# Patient Record
Sex: Male | Born: 1935 | Race: White | Hispanic: No | Smoking: Former smoker
Health system: Southern US, Community
[De-identification: ages and names within clinical notes are randomized; demographics above are authoritative.]

## PROBLEM LIST (undated history)

## (undated) DIAGNOSIS — R7881 Bacteremia: Secondary | ICD-10-CM

## (undated) DIAGNOSIS — I63412 Cerebral infarction due to embolism of left middle cerebral artery: Secondary | ICD-10-CM

## (undated) DIAGNOSIS — F1020 Alcohol dependence, uncomplicated: Secondary | ICD-10-CM

## (undated) DIAGNOSIS — M009 Pyogenic arthritis, unspecified: Secondary | ICD-10-CM

## (undated) DIAGNOSIS — I6521 Occlusion and stenosis of right carotid artery: Secondary | ICD-10-CM

## (undated) DIAGNOSIS — M1990 Unspecified osteoarthritis, unspecified site: Secondary | ICD-10-CM

## (undated) DIAGNOSIS — B955 Unspecified streptococcus as the cause of diseases classified elsewhere: Secondary | ICD-10-CM

## (undated) DIAGNOSIS — A419 Sepsis, unspecified organism: Secondary | ICD-10-CM

## (undated) DIAGNOSIS — S065X9A Traumatic subdural hemorrhage with loss of consciousness of unspecified duration, initial encounter: Secondary | ICD-10-CM

## (undated) DIAGNOSIS — I4891 Unspecified atrial fibrillation: Secondary | ICD-10-CM

## (undated) DIAGNOSIS — K6812 Psoas muscle abscess: Secondary | ICD-10-CM

## (undated) DIAGNOSIS — E119 Type 2 diabetes mellitus without complications: Secondary | ICD-10-CM

## (undated) DIAGNOSIS — I10 Essential (primary) hypertension: Secondary | ICD-10-CM

## (undated) DIAGNOSIS — M109 Gout, unspecified: Secondary | ICD-10-CM

## (undated) DIAGNOSIS — G9340 Encephalopathy, unspecified: Secondary | ICD-10-CM

## (undated) HISTORY — PX: JOINT REPLACEMENT: SHX530

## (undated) HISTORY — PX: HIP ARTHROPLASTY: SHX981

## (undated) HISTORY — PX: OTHER SURGICAL HISTORY: SHX169

## (undated) HISTORY — PX: TONSILLECTOMY: SUR1361

## (undated) HISTORY — PX: TOTAL SHOULDER ARTHROPLASTY: SHX126

---

## 2000-02-02 ENCOUNTER — Encounter: Payer: Self-pay | Admitting: Orthopedic Surgery

## 2000-02-04 ENCOUNTER — Inpatient Hospital Stay (HOSPITAL_COMMUNITY): Admission: RE | Admit: 2000-02-04 | Discharge: 2000-02-09 | Payer: Self-pay | Admitting: Orthopedic Surgery

## 2000-02-04 ENCOUNTER — Encounter: Payer: Self-pay | Admitting: Orthopedic Surgery

## 2000-03-17 ENCOUNTER — Emergency Department (HOSPITAL_COMMUNITY): Admission: EM | Admit: 2000-03-17 | Discharge: 2000-03-17 | Payer: Self-pay | Admitting: Emergency Medicine

## 2002-09-26 ENCOUNTER — Encounter: Payer: Self-pay | Admitting: Orthopedic Surgery

## 2002-09-27 ENCOUNTER — Encounter: Payer: Self-pay | Admitting: Orthopedic Surgery

## 2002-09-27 ENCOUNTER — Inpatient Hospital Stay (HOSPITAL_COMMUNITY): Admission: RE | Admit: 2002-09-27 | Discharge: 2002-10-02 | Payer: Self-pay | Admitting: Orthopedic Surgery

## 2002-09-28 ENCOUNTER — Encounter: Payer: Self-pay | Admitting: Orthopedic Surgery

## 2002-12-18 ENCOUNTER — Encounter (INDEPENDENT_AMBULATORY_CARE_PROVIDER_SITE_OTHER): Payer: Self-pay | Admitting: Specialist

## 2002-12-18 ENCOUNTER — Inpatient Hospital Stay (HOSPITAL_COMMUNITY): Admission: RE | Admit: 2002-12-18 | Discharge: 2002-12-21 | Payer: Self-pay | Admitting: Orthopedic Surgery

## 2003-04-20 ENCOUNTER — Inpatient Hospital Stay (HOSPITAL_COMMUNITY): Admission: RE | Admit: 2003-04-20 | Discharge: 2003-04-22 | Payer: Self-pay | Admitting: Orthopedic Surgery

## 2003-09-03 ENCOUNTER — Inpatient Hospital Stay (HOSPITAL_COMMUNITY): Admission: RE | Admit: 2003-09-03 | Discharge: 2003-09-04 | Payer: Self-pay | Admitting: Orthopedic Surgery

## 2003-10-24 ENCOUNTER — Encounter: Admission: RE | Admit: 2003-10-24 | Discharge: 2003-10-24 | Payer: Self-pay | Admitting: Family Medicine

## 2005-08-10 ENCOUNTER — Ambulatory Visit (HOSPITAL_BASED_OUTPATIENT_CLINIC_OR_DEPARTMENT_OTHER): Admission: RE | Admit: 2005-08-10 | Discharge: 2005-08-10 | Payer: Self-pay | Admitting: Orthopedic Surgery

## 2006-03-18 ENCOUNTER — Encounter: Admission: RE | Admit: 2006-03-18 | Discharge: 2006-03-18 | Payer: Self-pay | Admitting: Family Medicine

## 2007-08-29 ENCOUNTER — Inpatient Hospital Stay (HOSPITAL_COMMUNITY): Admission: RE | Admit: 2007-08-29 | Discharge: 2007-08-31 | Payer: Self-pay | Admitting: Orthopedic Surgery

## 2008-04-18 ENCOUNTER — Inpatient Hospital Stay (HOSPITAL_COMMUNITY): Admission: RE | Admit: 2008-04-18 | Discharge: 2008-04-21 | Payer: Self-pay | Admitting: Orthopedic Surgery

## 2010-02-14 ENCOUNTER — Other Ambulatory Visit (HOSPITAL_COMMUNITY): Payer: Self-pay | Admitting: Orthopedic Surgery

## 2010-02-14 DIAGNOSIS — R52 Pain, unspecified: Secondary | ICD-10-CM

## 2010-02-19 ENCOUNTER — Other Ambulatory Visit (HOSPITAL_COMMUNITY): Payer: Self-pay

## 2010-02-26 ENCOUNTER — Ambulatory Visit (HOSPITAL_COMMUNITY)
Admission: RE | Admit: 2010-02-26 | Discharge: 2010-02-26 | Disposition: A | Discharge status: Home / Self Care | Payer: Medicare Other | Source: Ambulatory Visit | Attending: Orthopedic Surgery | Admitting: Orthopedic Surgery

## 2010-02-26 ENCOUNTER — Other Ambulatory Visit (HOSPITAL_COMMUNITY): Payer: Self-pay | Admitting: Orthopedic Surgery

## 2010-02-26 DIAGNOSIS — Z9889 Other specified postprocedural states: Secondary | ICD-10-CM

## 2010-02-26 DIAGNOSIS — N433 Hydrocele, unspecified: Secondary | ICD-10-CM | POA: Insufficient documentation

## 2010-02-26 DIAGNOSIS — M25559 Pain in unspecified hip: Secondary | ICD-10-CM | POA: Insufficient documentation

## 2010-02-26 DIAGNOSIS — Z96649 Presence of unspecified artificial hip joint: Secondary | ICD-10-CM | POA: Insufficient documentation

## 2010-02-26 DIAGNOSIS — D1779 Benign lipomatous neoplasm of other sites: Secondary | ICD-10-CM | POA: Insufficient documentation

## 2010-02-26 DIAGNOSIS — R52 Pain, unspecified: Secondary | ICD-10-CM

## 2010-02-26 DIAGNOSIS — Z1389 Encounter for screening for other disorder: Secondary | ICD-10-CM | POA: Insufficient documentation

## 2010-02-26 DIAGNOSIS — K5730 Diverticulosis of large intestine without perforation or abscess without bleeding: Secondary | ICD-10-CM | POA: Insufficient documentation

## 2010-04-16 LAB — CBC
HCT: 29.6 % — ABNORMAL LOW (ref 39.0–52.0)
Hemoglobin: 9.8 g/dL — ABNORMAL LOW (ref 13.0–17.0)
Hemoglobin: 10.4 g/dL — ABNORMAL LOW (ref 13.0–17.0)
MCHC: 35.2 g/dL (ref 30.0–36.0)
MCV: 97.4 fL (ref 78.0–100.0)
MCV: 98.2 fL (ref 78.0–100.0)
Platelets: 169 10*3/uL (ref 150–400)
Platelets: 161 10*3/uL (ref 150–400)
RBC: 2.83 MIL/uL — ABNORMAL LOW (ref 4.22–5.81)
RBC: 3.84 MIL/uL — ABNORMAL LOW (ref 4.22–5.81)
RDW: 14.1 % (ref 11.5–15.5)
RDW: 13.7 % (ref 11.5–15.5)
WBC: 8.7 10*3/uL (ref 4.0–10.5)
WBC: 5.4 10*3/uL (ref 4.0–10.5)

## 2010-04-16 LAB — BASIC METABOLIC PANEL
BUN: 12 mg/dL (ref 6–23)
BUN: 19 mg/dL (ref 6–23)
CO2: 25 mEq/L (ref 19–32)
CO2: 25 mEq/L (ref 19–32)
CO2: 27 mEq/L (ref 19–32)
Calcium: 9.2 mg/dL (ref 8.4–10.5)
Calcium: 9.5 mg/dL (ref 8.4–10.5)
Calcium: 10.9 mg/dL — ABNORMAL HIGH (ref 8.4–10.5)
Chloride: 89 mEq/L — ABNORMAL LOW (ref 96–112)
Chloride: 94 mEq/L — ABNORMAL LOW (ref 96–112)
Creatinine, Ser: 0.95 mg/dL (ref 0.4–1.5)
Creatinine, Ser: 1.02 mg/dL (ref 0.4–1.5)
Creatinine, Ser: 1.06 mg/dL (ref 0.4–1.5)
GFR calc Af Amer: >60 mL/min (ref >60)
GFR calc Af Amer: >60 mL/min (ref >60)
GFR calc Af Amer: >60 mL/min (ref >60)
GFR calc non Af Amer: >60 mL/min (ref >60)
Glucose, Bld: 108 mg/dL — ABNORMAL HIGH (ref 70–99)
Glucose, Bld: 162 mg/dL — ABNORMAL HIGH (ref 70–99)
Glucose, Bld: 147 mg/dL — ABNORMAL HIGH (ref 70–99)
Potassium: 3.6 mEq/L (ref 3.5–5.1)
Potassium: 3.8 mEq/L (ref 3.5–5.1)
Sodium: 125 mEq/L — ABNORMAL LOW (ref 135–145)
Sodium: 125 mEq/L — ABNORMAL LOW (ref 135–145)
Sodium: 130 mEq/L — ABNORMAL LOW (ref 135–145)

## 2010-04-16 LAB — URINALYSIS, MICROSCOPIC ONLY
Bilirubin Urine: NEGATIVE
Glucose, UA: NEGATIVE mg/dL
Hgb urine dipstick: NEGATIVE
Specific Gravity, Urine: 1.015 (ref 1.005–1.030)
Urobilinogen, UA: 0.2 mg/dL (ref 0.0–1.0)

## 2010-04-16 LAB — GLUCOSE, CAPILLARY
Glucose-Capillary: 157 mg/dL — ABNORMAL HIGH (ref 70–99)
Glucose-Capillary: 112 mg/dL — ABNORMAL HIGH (ref 70–99)
Glucose-Capillary: 131 mg/dL — ABNORMAL HIGH (ref 70–99)

## 2010-04-16 LAB — PROTIME-INR
INR: 1.4 (ref 0.00–1.49)
INR: 1.4 (ref 0.00–1.49)
INR: 1 (ref 0.00–1.49)
Prothrombin Time: 18.1 seconds — ABNORMAL HIGH (ref 11.6–15.2)
Prothrombin Time: 13.8 seconds (ref 11.6–15.2)

## 2010-04-16 LAB — TYPE AND SCREEN: Antibody Screen: NEGATIVE

## 2010-04-16 LAB — APTT: aPTT: 27 seconds (ref 24–37)

## 2010-04-16 LAB — DIFFERENTIAL
Basophils Relative: 1 % (ref 0–1)
Lymphs Abs: 1.8 10*3/uL (ref 0.7–4.0)
Monocytes Relative: 12 % (ref 3–12)
Neutro Abs: 2.7 10*3/uL (ref 1.7–7.7)
Neutrophils Relative %: 49 % (ref 43–77)

## 2010-05-13 ENCOUNTER — Inpatient Hospital Stay (HOSPITAL_COMMUNITY)
Admission: AD | Admit: 2010-05-13 | Discharge: 2010-05-17 | DRG: 481 | Disposition: A | Discharge status: Home Health | Payer: Medicare Other | Source: Other Acute Inpatient Hospital | Attending: Orthopedic Surgery | Admitting: Orthopedic Surgery

## 2010-05-13 ENCOUNTER — Emergency Department (HOSPITAL_BASED_OUTPATIENT_CLINIC_OR_DEPARTMENT_OTHER)
Admission: EM | Admit: 2010-05-13 | Discharge: 2010-05-13 | Disposition: A | Discharge status: Other Acute Inpatient Hospital | Payer: Medicare Other | Attending: Emergency Medicine | Admitting: Emergency Medicine

## 2010-05-13 ENCOUNTER — Emergency Department (INDEPENDENT_AMBULATORY_CARE_PROVIDER_SITE_OTHER): Payer: Medicare Other

## 2010-05-13 DIAGNOSIS — I10 Essential (primary) hypertension: Secondary | ICD-10-CM | POA: Diagnosis present

## 2010-05-13 DIAGNOSIS — X500XXA Overexertion from strenuous movement or load, initial encounter: Secondary | ICD-10-CM

## 2010-05-13 DIAGNOSIS — Z8739 Personal history of other diseases of the musculoskeletal system and connective tissue: Secondary | ICD-10-CM | POA: Insufficient documentation

## 2010-05-13 DIAGNOSIS — D62 Acute posthemorrhagic anemia: Secondary | ICD-10-CM | POA: Diagnosis not present

## 2010-05-13 DIAGNOSIS — E119 Type 2 diabetes mellitus without complications: Secondary | ICD-10-CM | POA: Diagnosis present

## 2010-05-13 DIAGNOSIS — S72409A Unspecified fracture of lower end of unspecified femur, initial encounter for closed fracture: Secondary | ICD-10-CM

## 2010-05-13 DIAGNOSIS — Z96649 Presence of unspecified artificial hip joint: Secondary | ICD-10-CM

## 2010-05-13 DIAGNOSIS — Z87891 Personal history of nicotine dependence: Secondary | ICD-10-CM

## 2010-05-13 DIAGNOSIS — Z79899 Other long term (current) drug therapy: Secondary | ICD-10-CM | POA: Insufficient documentation

## 2010-05-13 DIAGNOSIS — I517 Cardiomegaly: Secondary | ICD-10-CM | POA: Insufficient documentation

## 2010-05-13 DIAGNOSIS — Z96659 Presence of unspecified artificial knee joint: Secondary | ICD-10-CM

## 2010-05-13 DIAGNOSIS — W19XXXA Unspecified fall, initial encounter: Secondary | ICD-10-CM | POA: Diagnosis present

## 2010-05-13 DIAGNOSIS — M109 Gout, unspecified: Secondary | ICD-10-CM | POA: Diagnosis present

## 2010-05-13 DIAGNOSIS — S72499A Other fracture of lower end of unspecified femur, initial encounter for closed fracture: Secondary | ICD-10-CM | POA: Insufficient documentation

## 2010-05-13 DIAGNOSIS — Z01811 Encounter for preprocedural respiratory examination: Secondary | ICD-10-CM

## 2010-05-13 DIAGNOSIS — K219 Gastro-esophageal reflux disease without esophagitis: Secondary | ICD-10-CM | POA: Insufficient documentation

## 2010-05-13 DIAGNOSIS — M25559 Pain in unspecified hip: Secondary | ICD-10-CM

## 2010-05-13 LAB — DIFFERENTIAL
Basophils Absolute: 0 10*3/uL (ref 0.0–0.1)
Eosinophils Relative: 0 % (ref 0–5)
Lymphocytes Relative: 18 % (ref 12–46)
Lymphs Abs: 1.6 10*3/uL (ref 0.7–4.0)
Monocytes Absolute: 0.7 10*3/uL (ref 0.1–1.0)
Neutro Abs: 6.6 10*3/uL (ref 1.7–7.7)

## 2010-05-13 LAB — BASIC METABOLIC PANEL
BUN: 27 mg/dL — ABNORMAL HIGH (ref 6–23)
CO2: 22 mEq/L (ref 19–32)
Chloride: 93 mEq/L — ABNORMAL LOW (ref 96–112)
Creatinine, Ser: 0.7 mg/dL (ref 0.4–1.5)
Glucose, Bld: 133 mg/dL — ABNORMAL HIGH (ref 70–99)
Potassium: 3.2 mEq/L — ABNORMAL LOW (ref 3.5–5.1)

## 2010-05-13 LAB — CBC
HCT: 38.8 % — ABNORMAL LOW (ref 39.0–52.0)
MCHC: 36.3 g/dL — ABNORMAL HIGH (ref 30.0–36.0)
MCV: 89 fL (ref 78.0–100.0)
Platelets: 253 10*3/uL (ref 150–400)
RDW: 13 % (ref 11.5–15.5)

## 2010-05-14 ENCOUNTER — Inpatient Hospital Stay (HOSPITAL_COMMUNITY): Payer: Medicare Other

## 2010-05-14 LAB — GLUCOSE, CAPILLARY
Glucose-Capillary: 152 mg/dL — ABNORMAL HIGH (ref 70–99)
Glucose-Capillary: 144 mg/dL — ABNORMAL HIGH (ref 70–99)
Glucose-Capillary: 173 mg/dL — ABNORMAL HIGH (ref 70–99)

## 2010-05-15 LAB — BASIC METABOLIC PANEL
GFR calc non Af Amer: >60 mL/min (ref >60)
Glucose, Bld: 177 mg/dL — ABNORMAL HIGH (ref 70–99)
Potassium: 4.4 mEq/L (ref 3.5–5.1)
Sodium: 134 mEq/L — ABNORMAL LOW (ref 135–145)

## 2010-05-15 LAB — CBC
HCT: 27.2 % — ABNORMAL LOW (ref 39.0–52.0)
Hemoglobin: 9.6 g/dL — ABNORMAL LOW (ref 13.0–17.0)
MCH: 32.8 pg (ref 26.0–34.0)
RBC: 2.93 MIL/uL — ABNORMAL LOW (ref 4.22–5.81)

## 2010-05-15 LAB — GLUCOSE, CAPILLARY
Glucose-Capillary: 196 mg/dL — ABNORMAL HIGH (ref 70–99)
Glucose-Capillary: 157 mg/dL — ABNORMAL HIGH (ref 70–99)

## 2010-05-15 LAB — PROTIME-INR: Prothrombin Time: 14.7 seconds (ref 11.6–15.2)

## 2010-05-16 LAB — BASIC METABOLIC PANEL
Calcium: 8.3 mg/dL — ABNORMAL LOW (ref 8.4–10.5)
GFR calc Af Amer: >60 mL/min (ref >60)
GFR calc non Af Amer: >60 mL/min (ref >60)
Potassium: 3.6 mEq/L (ref 3.5–5.1)
Sodium: 135 mEq/L (ref 135–145)

## 2010-05-16 LAB — GLUCOSE, CAPILLARY
Glucose-Capillary: 152 mg/dL — ABNORMAL HIGH (ref 70–99)
Glucose-Capillary: 171 mg/dL — ABNORMAL HIGH (ref 70–99)
Glucose-Capillary: 174 mg/dL — ABNORMAL HIGH (ref 70–99)

## 2010-05-16 LAB — CBC
HCT: 20.9 % — ABNORMAL LOW (ref 39.0–52.0)
Hemoglobin: 7.3 g/dL — ABNORMAL LOW (ref 13.0–17.0)
MCH: 32.4 pg (ref 26.0–34.0)
MCHC: 34.9 g/dL (ref 30.0–36.0)

## 2010-05-17 LAB — CROSSMATCH
ABO/RH(D): A POS
Unit division: 0

## 2010-05-17 LAB — PROTIME-INR: Prothrombin Time: 16.5 seconds — ABNORMAL HIGH (ref 11.6–15.2)

## 2010-05-17 LAB — TISSUE CULTURE
Culture: NO GROWTH
Gram Stain: NONE SEEN

## 2010-05-17 LAB — BASIC METABOLIC PANEL
BUN: 12 mg/dL (ref 6–23)
CO2: 31 mEq/L (ref 19–32)
Calcium: 8.6 mg/dL (ref 8.4–10.5)
GFR calc non Af Amer: >60 mL/min (ref >60)
Glucose, Bld: 136 mg/dL — ABNORMAL HIGH (ref 70–99)
Sodium: 133 mEq/L — ABNORMAL LOW (ref 135–145)

## 2010-05-17 LAB — CBC
Hemoglobin: 8.9 g/dL — ABNORMAL LOW (ref 13.0–17.0)
MCH: 31 pg (ref 26.0–34.0)
MCHC: 35 g/dL (ref 30.0–36.0)
RDW: 15.4 % (ref 11.5–15.5)

## 2010-05-17 LAB — GLUCOSE, CAPILLARY
Glucose-Capillary: 142 mg/dL — ABNORMAL HIGH (ref 70–99)
Glucose-Capillary: 149 mg/dL — ABNORMAL HIGH (ref 70–99)
Glucose-Capillary: 145 mg/dL — ABNORMAL HIGH (ref 70–99)

## 2010-05-20 NOTE — Op Note (Signed)
NAMEAXXEL, GUDE NO.:  [id number]   MEDICAL RECORD NO.:  [id number]          PATIENT TYPE:  INP   LOCATION:  5039                         FACILITY:  [facility]   PHYSICIAN:  Harvie Junior, M.D.   DATE OF BIRTH:  April 24, 1935   DATE OF PROCEDURE:  04/18/2008  DATE OF DISCHARGE:                               OPERATIVE REPORT   PREOPERATIVE DIAGNOSIS:  End-stage degenerative joint disease, right  knee.   POSTOPERATIVE DIAGNOSIS:  End-stage degenerative joint disease, right  knee.   PROCEDURES:  1. Right total knee replacement with a Sigma system size 5 femur, size      5 tibia, 41-mm all-poly patella, and a 10-mm bridging bearing.  2. Computer-assisted right total knee replacement.   SURGEON:  Harvie Junior, MD   ASSISTANT:  Marshia Ly, PA   ANESTHESIA:  General.   BRIEF HISTORY:  Mr. Pember is a 75 year old male with a long history of  significant pain in his right knee.  X-ray showed bone-on-bone changes  with degeneration of the knee.  We treated conservatively him for a  period of time.  Because of failure of all conservative cares, he was  ultimately taken to the operating room for total knee replacement.  Because of his previous total hip replacement and need for a longevity,  I felt that computer-assisted alignment was necessary to not to have to  worry about instrument in the canal running into his femoral component  and potentially jeopardizing neutral alignment.  He is brought to the  operating room for this procedure.   PROCEDURE:  The patient was brought to the operating room.  After  adequate anesthesia was obtained with general endotracheal anesthesia,  the patient was placed supine on the operating table.  The right leg was  then prepped and draped in the usual sterile fashion.  Following this,  leg was exsanguinated with blood pressure tourniquet was inflated to 350  mmHg.  Following this, a midline incision was made.  Subcutaneous  tissues were dissected down to the level of the extensor mechanism, and  a medial parapatellar arthrotomy was undertaken.  Once this was  completed, attention was turned towards the anterior and posterior  cruciate excision as well as medial and lateral meniscectomy.  Once this  was performed, the computer was placed.  At this point, 2 pins in the  tibia, 2 pins in the femur, and at this point, the registration process  was undertaken.  The overall usage of the computer was about 30 minutes  of the surgical procedure.  At this point once the alignment was checked  and the registration process was completed, attention was then turned  towards the cuts.  The tibia was cut perpendicular to the long axis  under computer assistance.  The femur was then cut perpendicular to the  anatomic axis under computer assistance.  Once this was completed, the  spacer blocks were used being a perfect neutral long alignment, perfect  guide balance.  Once this was completed, attention was then turned  towards the femur, which was sized to  a 5.  Anterior and posterior cuts  were made as well as chamfers and the boxes were then cut.  Attention  was turned to the tibia which was sized to 5 and this was rotationally  aligned and then the central portion was drilled, a keel was placed, 10-  mm poly was used, and the computer was then used to help [place] assess.  Alignment was perfectly neutral and is a long alignment and easy full  extension.  Attention was then turned towards the patella which was cut  perpendicular to the patella down to a level of 13 mm and 41-mm poly  gave the best coverage of the patella and the patella lugs were then  drilled into the femur and all trial components were removed.  Once this  was done, the knee was copiously and thoroughly irrigated and suctioned  dry.  Once this was completed, the components were cemented  into place,  size 5 femur, size 5 tibia, 10-mm bridging bearing, and a  41-mm all-poly  patella.  The trial 10-mm insert was placed and the cement was allowed  to harden.  Once the cement was completely hardened and tourniquet was  let down, bleeding was controlled with electrocautery.  A 12.5 poly was  trialed and felt to be too large.  A 10-mm poly was then opened, and  once the bleeding was controlled with electrocautery, the 10-mm poly was  placed and excellent easy full range of motion at this point.  At this  point, the medium Hemovac drain was placed.  The medial parapatellar  arthrotomy was closed with 1 Vicryl running, skin with 0 and 2-0 Vicryl  and staples.  A sterile compressive dressing was applied as well as knee  immobilizer, and the patient was taken to the recovery room and was  noted to be in a satisfactory condition.  Estimated blood loss for this  procedure was none.      Harvie Junior, M.D.  Electronically Signed     JLG/MEDQ  D:  04/18/2008  T:  04/19/2008  Job:  [id number]

## 2010-05-20 NOTE — Op Note (Signed)
Kenneth, Hood NO.:  [id number]   MEDICAL RECORD NO.:  [id number]          PATIENT TYPE:  INP   LOCATION:  2899                         FACILITY:  [facility]   PHYSICIAN:  Harvie Junior, M.D.   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   POSTOPERATIVE DIAGNOSIS:  End-stage degenerative joint disease of the  right hip.   OPERATIONS AND PROCEDURE:  Right total hip replacement with a [employer] S-ROM system with 18 13 stem with a 36 +12 offset a +0 ball 51  mm and a 58 mm ASR cup.  We used a 62 F extra large spout.   SURGEON:  Harvie Junior, M.D.   ASSISTANT:  Marshia Ly, P.A.   ANESTHESIA:  General.   BRIEF HISTORY:  [name] is a 75 year old male with a long history of  having significant pain in his right hip.  We followed up conservatively  for period of time, but after failure of all conservative care.  The  patient was also taken to the operating room for right total hip  replacement.   PROCEDURE:  The patient was taken to the operating room.  After adequate  anesthesia was obtained with general anesthetic.  The patient was placed  in left lateral decubitus position.  All bony prominences were well  padded.  Attention was then turned to the right hip where after routine  prep and drape, the incision was made for posterior approach the hip.  Subcutaneous tissue was dissected down at the level of tensor fascia  which was divided along with its fibers.  Attention then turned towards  the posterior approach the hip.  Piriformis short external rotators were  taken down with a single flap and tagged.  Once this was completed,  attention was turned towards the posterior approach were dislocated the  hip and the provisional neck cut was made.  Attention was then turned to  the  acetabulum lobectomy was performed at that time, was sequentially  reamed to a level of 57 and a 58 ASR cup was put in place with 45  degrees of lateral opening and 30 degrees of anteversion.  Once this was  completed, attention was turned towards the stem side where the  clavicular was used to get lateral and then a canal was opened and  sequentially reamed to a level of 13 and a 35 was then taken down of  three-quarters of the way.  Attention at this time was turned towards  the drilling for the cone.  We went down with an 18 D, 18 B, 18 D, and  then 18 D, and then 2 F was reamed down and this was the appropriate  size an F and then the F cone was taken down; this got a nice ledge  anteriorly and then we put the spout and the spout came out to an extra  large.  Once this was completed, the trial components were put in place  30 F extra large spout 18, 13 stem with a 36 +12 offset and neutral  version and a +0 ball.  Excellent range of motion instability were  achieved here, and once this was completed of these were removed and the  final components were opened __________ and minus 3 ball as well; that  actually felt okay, but there was a little bit tendency of subluxation  about 30 degrees about 60 degrees of internal rotation.  At this point,  went back to a +0 ball and that was the final implants were opened 18 F  extra large cone followed by 18 13 stem with 36 +12 offset.  We put  about 5 degrees eversion on the stem.  We then opened to a trial +0 ball  then put in a +0 ball 51 mm to correspond to a 58 mm ASR cup.  Excellent  range of motion instability, at this point the wound was copiously and  thoroughly irrigated.  The posterior capsule and short external rotators  were repaired to the intertrochanteric crest through drill holes, then  the tensor fascia was closed with 1 Vicryl running subcu with 2-0 Vicryl  and skin staples.  Sterile compressive dressing was applied.  The   patient was taken to the recovery room and was noted to be in  satisfactory condition.   ESTIMATED BLOOD LOSS:  Procedure was 350 mL.        Harvie Junior, M.D.  Electronically Signed     JLG/MEDQ  D:  08/29/2007  T:  08/30/2007  Job:  [id number]

## 2010-05-20 NOTE — Op Note (Signed)
NAMEALFORD, GAMERO NO.:  [id number]  MEDICAL RECORD NO.:  [id number]           PATIENT TYPE:  LOCATION:                                 FACILITY:  PHYSICIAN:  Harvie Junior, M.D.   DATE OF BIRTH:  1935/05/28  DATE OF PROCEDURE:  05/14/2010 DATE OF DISCHARGE:                              OPERATIVE REPORT   PREOPERATIVE NOTE:  Spiral periprosthetic femur fracture, left.  POSTOPERATIVE DIAGNOSIS:  Spiral periprosthetic femur fracture, left.  PROCEDURE:  Open reduction and internal fixation of left periprosthetic spiral femur fracture with a Synthes 18-hole periprosthetic distal femur plate with fixation with multiple Dall-Miles cables and locking and nonlocking screws.  SURGEON:  Harvie Junior, MD  ASSISTANT:  Marshia Ly, PA  ANESTHESIA:  General.  BRIEF HISTORY:  Mr. Stellmach is a 75 year old male with long history of having had a left total knee replacement, was complicated by infection, ultimately treated and then had a two-staged revision which left him with a stemmed implant proximally and distally.  He was drinking the night prior to admission and fell and suffered a spiral femur fracture right at the tip of this prosthesis.  We evaluated him and felt that he needed open reduction and internal fixation and brought to the operating room for this procedure.  PROCEDURE:  The patient was brought to the operating room.  After adequate anesthesia was obtained with general anesthetic, the patient was placed into the lateral position on a beanbag and an axillary roll was put in place.  The left leg was then prepped and draped in usual sterile fashion.  Following this, a sterile tourniquet was applied.  The leg was exsanguinated.  Blood pressure tourniquet inflated to 300 mmHg. Following this, a lateral incision was made at the subcutaneous tissue down the level of the tensor fascia was divided in line with its fibers and divided in line with its  fibers .  Muscle split down into the femur. Lateral femur was identified both proximally and distally.  Once this was completed, the fracture was cleansed with healing elements and a manipulative closed reduction was undertaken.  A turkey-claw clamp was used to hold the fracture in an anatomically reduced position.  Once this was completed, an 18-hole plate was used and laid on the side of the femur.  Unclear at this point we considered having a fluoro imaging, but felt that we had a good five holes above where the tip of the prosthesis was.  At this point, a turkey-claw clamp was used to hold the plate in place.  We locked it proximally with a screw and then put two cables around the midbody with the parts of screw into the plate.  Once that was completed, the fluoro imaging was used and at this point showed [place] that we were really nicely up on the lateral side of the femur and that we had a good fit to the distal plate.  Once this was completed, the two additional screws were placed proximally.  The cables were then tightened provisionally and then we put in a nonlocking distal screw and  then two locking distal screws.  Two of these were bicortical and two unicortical.  Four distal screws in the distal fragment.  Once this was done, an additional cable was placed distally and we began to finally lock these in place.  We were able to sneak one screw around the implant.  The rest of these were not able to be put around the implant, so we had cables in that area.  Proximally, we alternated locking and nonlocking screws and this gave [place] excellent fixation up proximally.  We left the terminal hole free to do stress release.  At this point, the wound was irrigated, suctioned dry.  Final fluoro images were taken. Excellent fixation was achieved.  The knee was definitely moving as an unit, and AP and lateral fluoro looked like the plate was in good position and the knee was in reasonable position  relative to the tibia. At this point, the wound was copiously and thoroughly lavaged and suctioned dry.  Lateral muscle fascia was closed with 0 Vicryl running. Tensor fascia closed with 1-0 Vicryl running, skin with 2-0 Vicryl and staples.  Sterile compressive dressing was applied and as well as a knee immobilizer.  The patient was taken to recovery where he was noted to be in satisfactory condition.  Estimated blood loss for the procedure was 250 mL.     Harvie Junior, M.D.     Ranae Plumber  D:  05/14/2010  T:  05/15/2010  Job:  [id number]  Electronically Signed by Jodi Geralds M.D. on 05/20/2010 02:55:40 PM

## 2010-05-23 NOTE — Op Note (Signed)
[facility]  Patient:    [name], [name]                        MRN: [id number] Proc. Date: 02/04/00 Adm. Date:  [id number] Attending:  Milly Jakob                           Operative Report  PREOPERATIVE DIAGNOSIS:  Severe degenerative joint disease of the left knee.  POSTOPERATIVE DIAGNOSIS:  Severe degenerative joint disease of the left knee.  PRINCIPAL PROCEDURE:  Left total joint replacement with DePuy LCS System, large femoral component, size 6 tibial component, 12.5 mm polyethylene insert and a large patella.  SURGEON:  Harvie Junior, M.D.  ASSISTANT:  Alinda Deem, M.D. and Currie Paris. Thedore Mins.  ANESTHESIA:  General.  BRIEF HISTORY:  He is a 75 year old male with a long history of having had degenerative joint disease of both of his knees.  We had followed him in the office for greater than two years with left degenerative joint disease. Because of persistent pain and failure of conservative care, he was ultimately taken to the operating room for a left total knee replacement.  PROCEDURE:  Patient taken to the operating room and after adequate anesthesia was obtained with general anesthetic, the patient placed supine on the operating table.  The left leg was then prepped and draped in usual sterile fashion.  Following this, a midline incision was made.  Subcutaneous tissue dissected down to the level of the extensor mechanism, which was clearly identified and a medial parapatellar arthrotomy was undertaken.  At this point, the knee was flexed up and a medial and lateral meniscectomies were performed, as well as excision of the fat pad.  Anterior and posterior cruciates were removed and the drill was used to gain access to the midline of the tibia.  Tibial guide was used.  Intra and extramedullary guides were aligned and the proximal tibia was cut perpendicular to the long axis of the tibia.  Following this, the femur was  sized.  A large was the appropriate size for the femur and a guide was used to make sure there was no notching anteriorly and the hole was drilled.  The 10 mm ______ was then used to allow stability medial and laterally and good stability was achieved.  At this point, the block was pinned and anterior and posterior cuts were made.  A lollipop guide was then placed and noticed to be a little tight in flexion and thoughts at that point were to go to the extension site to see before we made a final decision about that.  The distal femur was then cut to allow a 10 mm ______ to be put into place and again, this was a little bit tight and we wanted to see how the final components felt at this point.  The trial femur was put in place.  The tibia size six was felt to be the most appropriate size.  A five was certainly to small, six did give an excellent rim fit.  At this point, the six was used to prepare the proximal tibia, as well as the ______ proximally.  Once this was prepared, trials were put in place with a 10 trial.  At this point, the 10 trial seemed a little bit tight in both flexion and extension and particularly in flexion.  It was felt that  this needed to be improved and at this point, the jigs were used again, primarily to take 2 mm more of bone off of the proximal tibia.  To ensure a good cut, we actually removed down to probably 3.5 mm of bone, which was taken, which did open up the tibia quite a bit and the trial components were put back in place. The tibia had to be reprepared for the six tibial component.  The 10 mm component then was a little bit too loose and the 12.5 did give a perfect feel in both flexion and extension through a range of motion.  At this point, attention was turned to the patella, where the patella was cut down to a level of 13 mm and the patella jig was then used to make the three holes.  At this point, the knee was put through a range of motion, excellent  stability was achieved, as well as midline patellar tracking.  At this point, all components were removed and the knee was copiously irrigated with normal saline irrigation and suctioned dry.  The components were then opened and identified and cement was mixed on the back table.  All components were cemented into place and all excess cement was removed with cement tools.  Once the cement was allowed to harden, excellent stability was achieved through a range of motion and the patella with an excellent fit and midline tracking.  Following this, a Hemovac drain was put in place and the knee was copiously irrigated and suctioned dry with pulsatile lavage irrigator.  The medial parapatella arthrotomy was then closed with #1 Vicryl running locking suture.  The subcu was then closed with 0 and 2-0 Vicryl and the skin with a skin stapler.  A sterile compressive dressing was then applied and the patient was taken to the recovery room, where he was noted to be in satisfactory condition. Estimated blood loss for the procedure was none. DD:  02/04/00 TD:  02/04/00 Job: [id number]

## 2010-05-23 NOTE — Op Note (Signed)
NAME:  [name], ANCHETA                           ACCOUNT NO.:  [id number]   MEDICAL RECORD NO.:  [id number]                   PATIENT TYPE:  INP   LOCATION:  5025                                 FACILITY:  [facility]   PHYSICIAN:  Harvie Junior, M.D.                DATE OF BIRTH:  07/11/1935   DATE OF PROCEDURE:  09/03/2003  DATE OF DISCHARGE:                                 OPERATIVE REPORT   PREOPERATIVE DIAGNOSIS:  Severe end stage degenerative joint disease, left  shoulder.   POSTOPERATIVE DIAGNOSIS:  Severe end stage degenerative joint disease, left  shoulder, with significant posterior glenoid wear.   PROCEDURE:  Left shoulder hemiarthroplasty.   SURGEON:  Harvie Junior, M.D.   ASSISTANT:  Marshia Ly, P.A.   ANESTHESIA:  General.   BRIEF HISTORY:  75 year old male with a long history of having had severe  bilateral degenerative joint disease of the shoulders.  He ultimately had  undergone right hemiarthroplasty and had done very well.  He had significant  pain in the left shoulder and ultimately, because of continued and  unrelenting complaints of pain, presented for left hemiarthroplasty.   DESCRIPTION OF PROCEDURE:  The patient was taken to the operating room and  after undergoing general anesthesia, the patient was placed into the beach  chair position, all bony prominences were well padded.  A headrest was put  in place.  At this point, attention was turned to the left shoulder which  was prepped and draped in the usual sterile fashion.  Following this, an  incision was made from the coracoid down towards the deltoid insertion.  The  subcutaneous tissues were dissected down to the level of the deltopectoral  interval which was identified.  The cephalic vein was retracted with the  deltoid and the attention was turned towards the anterior aspect of the  shoulder.  The subscap muscle was taken down off the lesser tuberosity and  the capsule was then divided inferiorly  to the 6 o'clock position.  The  intramedullary guide was then placed.  The arm was extended and the humeral  head examined.  Intramedullary alignment was then achieved just at the  junction of the humeral head and the biceps tendon insertion.  The  intramedullary rod was then placed and the neck cutting guide was used and a  cut was made at the level of the humeral head.  Once the humeral head had  been excised, the neck cutting osteotome was then put in place and the trial  bodies were then used.  The trial was then reamed up to 12 and a size 10  body was put in place at this point.  Following this, it was put up to a 12  and this was trialed with a 15 offset and excellent range of motion was  achieved.  The glenoid was then evaluated and the glenoid was found  to have  symmetric wear, although it did appear to be somewhat posteriorly worn.  It  was felt that it would be too risky to try to put a glenoid in this  obviously oversized glenoid socket.  At this point, the osteophytes were  removed circumferentially around the humeral head.  A size 15 ball was used  on the 55 mm head and the arm was put through a range of motion.  Excellent  range of motion was achieved, no tendency towards subluxation or  dislocation.  At this point, attention was turned towards the opening of the  final components and the size 10 was ultimately chose with the 12 to knock  it down completely.  Cancellous grafting was undertaken in the proximal  portion where the body was and the 10 was hammered down into place.  The  head was again trialed.  Excellent range of motion was achieved with a 15  and the wound was then copiously irrigated and suctioned dry.  The subscap  was then advanced to the articular margin and the arm was now put through a  range of motion.  Excellent range of motion was achieved.  The patient could  place his arm on the opposite shoulder.  The patient could raise her arm  above her head  without any restriction of motion.  This was also easy to  produce with the arm at the side.  At this point, the wound was copiously  irrigated and suctioned dry.  The deltopectoral interval was allowed to fall  together.  The subcu was closed with a combination of interrupted 0 and 2-0  Vicryl and the skin was closed with skin staples.  A sterile compressive  dressing was applied as well as a shoulder immobilizer.  The patient was  taken to the recovery room where she was noted to be in satisfactory  condition.  Estimated blood loss 200 mL.                                               Harvie Junior, M.D.    Ranae Plumber  D:  09/03/2003  T:  09/03/2003  Job:  [id number]

## 2010-05-23 NOTE — Discharge Summary (Signed)
[name], [name]                           ACCOUNT NO.:  [id number]   MEDICAL RECORD NO.:  [id number]                   PATIENT TYPE:  INP   LOCATION:  5010                                 FACILITY:  [facility]   PHYSICIAN:  Harvie Junior, M.D.                DATE OF BIRTH:  11-24-1935   DATE OF ADMISSION:  12/18/2002  DATE OF DISCHARGE:  12/21/2002                                 DISCHARGE SUMMARY   ADMISSION DIAGNOSES:  1. Septic left knee status post hardware removal and implantation of     antibiotic impregnated cement spacer, status post total knee replacement.  2. Olecranon bursitis of the left elbow.  3. History of gout.  4. History of hypertension.   DISCHARGE DIAGNOSES:  1. Septic left knee with negative intraoperative cultures with partial     patellar tendon tear of the left knee.  2. Olecranon bursitis of the left elbow.  3. History of gout.  4. History of hypertension.   PROCEDURES IN THE HOSPITAL:  1. Revision arthroplasty of the left knee with repair of the patellar     tendon, December 18, 2002, Harvie Junior, M.D.  2. Removal of cement spacer of the left knee, December 18, 2002, Harvie Junior, M.D.  3. Aspiration of the left elbow olecranon bursitis, December 18, 2002, Harvie Junior, M.D.   CONSULTATIONS IN HOSPITAL:  None.   BRIEF HISTORY AND PHYSICAL:  [name] is a 75 year old patient who is well  known to [place].  He initially underwent a left total knee replacement about one  year and a half prior to this admission.  He had persistent effusions in the  left knee and he was brought to the operating room in September 2004 where  he was noted to have loosening of his total knee components with positive  cultures.  He had been on six weeks of IV antibiotics with a cement spacer  within the left knee joint.  He had cultures in the office recently after  implantation of the cement spacer which showed no evidence of septic knee.  This was after the  complete six week course of IV antibiotics.  He was now  admitted for left total knee revision and placement of new total knee  components.  He was admitted for this.   PERTINENT LABORATORY AND X-RAY DATA:  EKG on admission showed normal sinus  rhythm and normal EKG.  Hemoglobin on admission was 12.3 and hematocrit was  36.9.  WBCs were 6.3.  On postoperative day one his hemoglobin was 9.9.  Postoperative day three his hemoglobin was 9.8.  Sed rate on admission was  32 mm per hour.  Pro time was 13.2 seconds with an INR of 1.0 and a PTT of  30.  On the day of discharge on Coumadin therapy his pro time was 16.7  seconds with an INR of 1.6.  CMET on admission showed no abnormalities other  than slightly elevated glucose at 113.  BMET on postoperative day one showed  decreased sodium at 131, but was otherwise unremarkable.  C reactive protein  was 0.3.  Left elbow cultures showed no growth three days from the olecranon  bursa.  Intraoperative gram stain and cultures from the left knee showed no  growth two days and no anaerobes isolated.   HOSPITAL COURSE:  Mr. Ardoin underwent surgery as was described in Dr.  Luiz Blare' operative note on December 18, 2002.  Postoperatively he was put on  IV Ancef 1 gram q. 8 hours times six doses.  A CPM machine was used and a  PCA morphine pump was used for pain control.  On postoperative day one he  was without complaints, he was taking fluids without difficulty, had not  gotten out of bed yet, his vital signs were stable, he was afebrile, left  knee dressing was clean and dry, his INR was 1.1 and his hemoglobin was 9.9,  potassium 4.2 and sodium 131.  He was seen by physical therapy.  He was  started on Coumadin for DVT prophylaxis.  On postoperative day two the  patient was doing well.  His Foley catheter was removed.  He was taking  fluids without difficulty.  He had been out of bed to the chair.  His  hemoglobin was 9.5, his INR was 1.4.  Intraoperative  cultures of the left  knee were negative.  The dressing was changed and the Hemovac drain was  pulled.  His PCA morphine pump was discontinued and his IV was converted to  a saline lock.  He progressed very nicely with physical therapy and on  postoperative day three he was without complaints.  He stated that he was  ready to go home.  He was taking fluids and voiding without difficulty.  He  was afebrile.  The left knee wound was benign.  His calf was soft.  There  was no sign of DVT.  His hemoglobin was 9.8.  His INR was 1.6.  He was  discharged home in improved condition on a regular diet.  His saline lock  was discontinued.  His activity status was weightbearing as tolerated on the  left with a walker.  He will use a home CPM machine and physical therapy for  range of motion of the left knee and walker ambulation.  He also will  continue on Coumadin for DVT prophylaxis times one month postoperative,  followed by the home health agency.  He will follow-up with Dr. Luiz Blare in  the office in two weeks.      Marshia Ly, P.A.                       Harvie Junior, M.D.    Cordelia Pen  D:  02/07/2003  T:  02/08/2003  Job:  [id number]   cc:   Jethro Bastos, M.D.  [address]  [place]  [place] [address]  Fax: [phone]

## 2010-05-23 NOTE — Op Note (Signed)
NAME:  [name], [name]                           ACCOUNT NO.:  [id number]   MEDICAL RECORD NO.:  [id number]                   PATIENT TYPE:  INP   LOCATION:  5005                                 FACILITY:  [facility]   PHYSICIAN:  Harvie Junior, M.D.                DATE OF BIRTH:  11/07/1935   DATE OF PROCEDURE:  04/20/2003  DATE OF DISCHARGE:                                 OPERATIVE REPORT   PREOPERATIVE DIAGNOSIS:  Severe end stage degenerative joint disease, right  shoulder.   POSTOPERATIVE DIAGNOSIS:  Severe end stage degenerative joint disease, right  shoulder.   PROCEDURE:  1. Right shoulder hemiarthroplasty with capsular release.  2. Injection of left shoulder under anesthesia with 6 mg Xylocaine and 80 mg     Depo-Medrol.   SURGEON:  Harvie Junior, M.D.   ASSISTANT:  Marshia Ly, P.A.   ANESTHESIA:  General.   BRIEF HISTORY:  This is a 75 year old male with a long history of having  severe degenerative joint disease of both shoulders, the right became much  worse than the left, having limited motion, pain all the time, is not able  to use the shoulder at all.  Because of continued complaints of pain, he was  ultimately taken to the operating room for total shoulder versus  hemiarthroplasty of the right shoulder.   DESCRIPTION OF PROCEDURE:  The patient was taken to the operating room and  after undergoing general anesthesia, the patient was placed on the operating  table in the beach chair position, all bony prominences were well padded.  Attention was turned to the right shoulder which was prepped and draped in  the usual sterile fashion.  Following this, an incision was made from the  clavicle  just north of the coracoid down to the deltoid insertion.  The  subcutaneous tissue were taken down to the level of the deltopectoral  interval which was clearly identified and the cephalic vein was retracted  laterally with the deltoid.  The conjoined tendon was identified  underneath  and the retractor was put in place underneath the deltoid and conjoined  tendon.  The subscapularis muscle was taken down off the lesser tuberosity  and the capsule was identified and resected back to the glenoid rim.  The  capsule was then taken down all the way around to about the 9 o'clock  position to insure capsular release.  Osteophytes were removed.  At this  point, the guide was used to engage the central portion of the canal and a  cut was made just at the junction of where the humeral head comes in to join  the greater tuberosity of the shoulder.  The cutting guide was used to do  this and then the body was sized and a prosthesis was put in place.  Once  this was accomplished, then the trial was put in place, the trial head  reduction was used.  There was excellent range of motion achieved at this  point and attention was turned towards the glenoid side.  The capsule was  released completely off the glenoid from the anterior, inferior, and  posterior, once this was accomplished, then there was excellent mobility of  the humeral head at this point and full range of motion of the shoulder was  easily achieved.  The final body was brought out and hammered into place.  Cancellous bone was used proximally to insure there was an excellent tight  fit proximally.  Once this had been seated, the 5615 ball was put in place  and the subscapularis muscle which was a dynamic unit at this point was  repaired to the anterior rim of the humerus.  Excellent fixation was  achieved with seven interrupted #2 Ethibond sutures through drill holes.  Excellent range of motion was now achieved, 60 degrees of external rotation,  180 degrees of elevation, 50% translation anterior posterior of the humeral  head.  At this point, the wound was copiously irrigated and suctioned dry.  The tissue was allowed to fall together.  The deep layer was closed with an  0 Vicryl interrupted suture and the skin  with skin staples.  A sterile  compressive dressing was applied.  The patient was taken to the recovery  room where she was noted to be in satisfactory condition.  Estimated blood  loss was minimal.                                               Harvie Junior, M.D.    Ranae Plumber  D:  04/20/2003  T:  04/20/2003  Job:  [id number]

## 2010-05-23 NOTE — Discharge Summary (Signed)
NAME:  [name], [name]                           ACCOUNT NO.:  [id number]   MEDICAL RECORD NO.:  [id number]                   PATIENT TYPE:  INP   LOCATION:  5006                                 FACILITY:  [facility]   PHYSICIAN:  Harvie Junior, M.D.                DATE OF BIRTH:  [birth year]   DATE OF ADMISSION:  09/27/2002  DATE OF DISCHARGE:  10/02/2002                                 DISCHARGE SUMMARY   ADMISSION DIAGNOSES:  1. Septic arthritis, left knee, status post left total knee replacement     2001.  2. History of gout.  3. History of hypertension.   DISCHARGE DIAGNOSES:  1. Septic arthritis, left knee, status post left total knee replacement     2001.  2. History of gout.  3. History of hypertension.   PROCEDURES IN HOSPITAL:  Removal of left total knee prosthesis with  debridement and synovectomy and implantation of antibiotic impregnated  cement spacer, left knee.   CONSULTATIONS IN THE HOSPITAL:  Infectious disease, Johny Sax, M.D.   BRIEF HISTORY:  [name] is a 75 year old white male who is well known to  [place] who has a history of a left total knee replacement done in 2001. He  overall did fairly well postoperatively, but the past six months has had  increasing pain in his knee with recurrent effusions in his knee. He came to  the office where the knee was aspirated multiple times. These were sent to  the laboratory where they grew no significant bacteria. He continued to have  recurrent pain his left knee with plan x-rays showing radiolucency around  the tibial component of the left total knee prosthesis. Because of his  continued problems and actually worsening pain and swelling in his knee,  despite the fact that he did not really have fevers, he was admitted for  exploration of his left knee and possible removal of his total knee  components.   PERTINENT LABORATORY DATA:  Hemoglobin on admission was 13.4, hematocrit  39.1, WBC 6.5. There were no  abnormalities of his indices. His sed rate was  17. His protime on admission was 12.5 seconds with an INR 0.9. The day of  discharge, his protime was 15 seconds with an INR of 1.3. CMET on admission  was unremarkable. BMET on postoperative day #1 was unremarkable. CRP prior  to admission was 3.7. Urinalysis showed no abnormalities. Left knee synovial  fluid cultures intraoperatively showed no growth three days. Gram stain of  the left knee synovial fluid showed few WBCs present, predominantly  mononuclears. He had rare gram positive cocci in pairs. Cultures showed no  growth two days and no aerobes isolated. There was no acid-fact bacilli  seen.   HOSPITAL COURSE:  The patient was admitted to the hospital on September 27, 2002. At the time of surgery which is well documented in Dr. Darene Lamer  operative  note, the prosthesis had significantly loosened, and multiple  cultures were taken of the tissue and fluid within the left knee. Gram did  show gram positive cocci on tissue cultures. Postoperatively was started on  Coumadin and antithrombolytic therapy per pharmacy protocol. Physical  therapy for walker ambulation, touch-down weight bearing only on the left  side and Ancef 1 g q.8h. Dr. Ninetta Lights saw the patient and felt that Ancef 1 g  q.8h. was _________ and was awaiting the cultures. He felt that a PICC line  would be needed as well. The patient's pain was well controlled  postoperatively. He did have a fever up to 101.1 and had good urine output.  On postoperative day #2, he had moderate knee pain. He was voiding okay and  taking fluids without difficulty, spiking a fever up to 101.2. His Hemovac  which was placed at the time of surgery was still putting out some drainage.  His hemoglobin was 10.2. CRP 3.7, ESR 17. Dressing was changed. Hemovac was  maintained in placed. His morphine pump was discontinued, and he was  converted to a saline lock. The drain was then pulled postoperative day  #3,  and his wound remained benign. Dr. Orvan Falconer saw the patient and suggested  that we continue Ancef as the overall cultures were negative (recommended IV  Ancef 1 g q.8h. x6 weeks. On October 02, 2002, the patient was without  complaints. He had no pain. He was taking fluids and voiding without  difficulty. He had a low grade fever of 100.5. He was then found to be  afebrile. His left knee dressing was clean and dry. His calf was soft. His  INR was 1.3.   DISCHARGE INSTRUCTIONS:  He was discharged home in improved condition on a  regular diet. Will get home health, Ancef 1 g IV q.8h. for 6 weeks, given  prescription for Percocet p.r.n. for pain #40 with no refills, and  prescription for Coumadin for one month for DVT prophylaxis. He was also  given prescription for Lovenox 30 mg subcu b.i.d. for 3 days as his Coumadin  was not therapeutic. He will follow up with Dr. Luiz Blare in the office in 10  days. Activity status will be touch-down weight bearing only on the left  with a walker. If he has increased problems with his knees, being increased  pain, swelling, or fevers, chills, or sweats, he will be contact us.      Marshia Ly, P.A.                       Harvie Junior, M.D.    Cordelia Pen  D:  01/18/2003  T:  01/18/2003  Job:  [id number]   cc:   Lacretia Leigh. Ninetta Lights, M.D.  [address]  [place]  [place] [address]  Fax: [phone]   Jethro Bastos, M.D.  [address]  [place]  [place] [address]  Fax: [phone]

## 2010-05-23 NOTE — Discharge Summary (Signed)
NAMETERION, HEDMAN NO.:  [id number]   MEDICAL RECORD NO.:  [id number]          PATIENT TYPE:  INP   LOCATION:  5039                         FACILITY:  [facility]   PHYSICIAN:  Harvie Junior, M.D.   DATE OF BIRTH:  [birth year]   DATE OF ADMISSION:  04/18/2008  DATE OF DISCHARGE:  04/21/2008                               DISCHARGE SUMMARY   ADMITTING DIAGNOSES:  1. End-stage degenerative joint disease, right knee.  2. Hypertension.  3. Gouty arthritis.  4. Chronic anemia.   DISCHARGE DIAGNOSES:  1. End-stage degenerative joint disease, right knee.  2. Hypertension.  3. Gouty arthritis.  4. Chronic anemia.  5. Hyponatremia.   PROCEDURE IN HOSPITAL:  Right total knee arthroplasty, computer  assisted, Harvie Junior, M.D., April 18, 2008.   BRIEF HISTORY:  [name] is a 75 year old male who is well known to [place].  He has a long history of right knee pain.  X-ray showed he has bone-on-  bone degenerative arthritis of the right knee.  We have injected the  knee several times, modified his activity, and given him medication.  He  continued to have night pain and pain with ambulation, and based upon  his clinical and radiographic findings, he is felt to be a candidate for  a right total knee arthroplasty and is admitted for this.   PERTINENT LABORATORY STUDIES:  Hemoglobin on admission was 13.0,  hematocrit 37.5, indices within normal limits.  On postop day #1, his  hemoglobin was 10.4, #2 was 9.8, #3 was 9.3.  Protime on admission was  13.8 seconds with an INR of 1.0 and a PTT of 27.  On day of discharge,  his protime was 18.1 seconds with an INR of 1.4.  On admission, his CMET  showed an elevated potassium of 5.4.  He did have an elevated BUN of 40.  Creatinine was normal at 1.06.  BMET was followed on a regular basis and  his BUN and creatinine were normal.  Potassium remained normal with a  low sodium ranging from 125 to 130.  Urinalysis showed rare  epithelials  and rare bacteria.  His CBG showed blood glucoses ranging from 109 to  169.   HOSPITAL COURSE:  Mr. Fritsch was brought to the operating room on April 18, 2008, where he underwent a right total knee arthroplasty as well  described in Dr. Luiz Blare' operative note.  Preoperatively, he was given a  gram of Ancef and 80 mg IV of gentamicin prophylactically.  Postoperatively, he was put on a gram of Ancef q.8 h. x3 doses per  protocol.  He was started on PCA morphine pump for pain control along  with IV fluids.  Physical therapy was instituted for range of motion of  the right knee and walker ambulation weightbearing as tolerated.  On  postop day #1, he had moderate knee pain, relieved well with PCA  morphine.  He was without significant complaints.  Incentive spirometry  was encouraged to keep his pulmonary status without difficulty.  On  postop day #2, he stated that his  knee was sore.  He was taking fluids  and voiding without difficulty.  He was out of bed with physical  therapy.  His vital signs were stable.  His O2 sats were 93% on room  air.  His right knee wound was benign.  His calf was soft.  Dressing was  changed.  His Hemovac drain was pulled.  His hemoglobin was 9.8.  He was  continued on oral iron.  His sodium was decreased at 125 and he was  given IV bolus of normal saline and he was fluid restricted.  He  progressed nicely from there and on postop day #3, he was steady.  He  was ready to go home.  He was taking fluids and voiding without  difficulty.  His vital signs were stable.  He was afebrile.  His right  knee dressing was clean and dry.  He moves that right lower extremity.  His hemoglobin was 9.3.  His sodium was still a little low.  Potassium  was fine.  His INR was 1.4 on Coumadin for DVT prophylaxis.  It was  suggested that some oral sodium be used at home with some level of fluid  restriction.  He was discharged to home in improved condition.  He was   given Rx Percocet 5 mg p.o. for pain and Coumadin x1 month for DVT  prophylaxis.  Ordered home health physical therapy and home CPM machine,  home health RN for protimes and Coumadin management x1 month postop.  Follow up with Dr. Luiz Blare in the office in 10-12 days, sooner if any  problems occur.      Marshia Ly, P.A.      Harvie Junior, M.D.  Electronically Signed    JB/MEDQ  D:  06/13/2008  T:  06/14/2008  Job:  [id number]   cc:   Jethro Bastos, M.D.

## 2010-05-23 NOTE — Discharge Summary (Signed)
[place]  Patient:    [name], [name]                        MRN: [id number] Adm. Date:  [id number] Disc. Date: [id number] Attending:  Milly Jakob Dictator:   Currie Paris. Thedore Mins. CC:         Jethro Bastos, M.D.   Discharge Summary  ADMISSION DIAGNOSES: 1. End-stage degenerative arthritis left knee. 2. Hypertension. 3. Degenerative joint disease right knee. 4. Hypercholesterolemia. 5. History of gouty arthritis.  DISCHARGE DIAGNOSES: 1. End-stage degenerative arthritis left knee. 2. Hypertension. 3. Degenerative joint disease right knee. 4. Hypercholesterolemia. 5. History of gouty arthritis.  PROCEDURES:  Left total knee arthroplasty, Milly Jakob, M.D.  HISTORY OF PRESENT ILLNESS:  Mr. Mclester is a 75 year old white male who is well known to [place].  He has a long history of left knee pain.  Standing x-rays of the left knee show bone on bone with marked end-stage degenerative changes.  He has had cortisone injections in his knee.  He has had modification of his activity and has used anti-inflammatory medications, all with only temporary relief of his usual knee pain.  He has complaints of pain with ambulation.  He is unable to walk more than a city block and does have night pain.  Since he did not get relief with exhaustive conservative treatment he was felt to be a candidate for a left total knee arthroplasty and is admitted for this.  LABORATORY DATA:  EKG on admission showed normal sinus rhythm with incomplete right bundle branch block.  Postoperative x-rays of the left knee showed anatomic alignment status post left total knee arthroplasty with no acute complicating features.  Hemoglobin on admission was 14.2, hematocrit 40.5, WBC 5.3.  On postoperative day #1 his hemoglobin was 12.0.  On postoperative day #2 it was 10.0.  On postoperative day #3 it was 9.5.  His protime on admission was 12.7 seconds with an INR of 1.0,  PTT was 24.  On the date of discharge his protime was 15.3 seconds with an INR of 1.4 on Coumadin antithrombolytic therapy.  On February 02, 2000, his sodium was 133, otherwise his CMET was within normal limits other than slightly elevated ALT at 42.  On postoperative day #1 his sodium was 132, and his BMET was otherwise normal.  Urinalysis on admission was within normal limits.  HOSPITAL COURSE:  The patient underwent left total knee arthroplasty as well described in Dr. Luiz Blare operative note on February 04, 2000.  Postoperatively he was put on his usual home medications as well as a PCA morphine pump, IV fluids, and six doses of Ancef 1 g IV q.8h. prophylactically.  He was started on Coumadin antithrombolytic per pharmacy protocol.  CPM machine was used to the left knee.  Incentive spirometry was ordered to keep his lungs clear. Physical therapy was ordered to start the day after surgery getting him out of bed, weightbearing as tolerated on the left, ambulating with a walker.  On postoperative day #1 the patient was doing well.  He had some difficulty voiding.  He had in-and-out catheterization of 800 cc of urine.  He was gotten out of bed with physical therapy.  On postoperative day #2 he had no complaints.  He was up in a chair.  He was taking fluids, and the Foley catheter, which had been left in after he had catheterization, was intact.  He spiked  a fever up to 100.4, and it was then found to be 99.5.  His left knee dressing wass changed, his wound was benign, his calf was soft, and NV wa intact distally.  His hemoglobin was 10.0, and INR was 1.2.  His IV and PCA morphine pump were discontinued.  His Foley catheter was discontinued.  The patient progressed with physical therapy and, after his Foley was removed, he was voiding without difficulty.  Occupational therapy consult was obtained for ADLs.  On February 09, 2000, the patient was without complaints, he was afebrile, his vital  signs were stable.  His wound was benign, his calf was supple.  His range of motion of the knee was 10 degrees to 60 degrees flexion. He was then felt to be stable to be discharged home.  He was discharged home in improved condition, to use a home CPM as well as home health physical therapy for walker ambulation, weightbearing as tolerated on the left.  He was given a prescription for Tylox p.r.n. for pain and Coumadin antithrombolytic per pharmacy protocol x 1 month postoperatively.  CONDITION ON DISCHARGE:  Improved.  DIET:  Regular.  FOLLOW-UP:  With Dr. Luiz Blare in 10 days in the office, sooner if any problems occur.DD:  02/23/00 TD:  02/23/00 Job: [id number]

## 2010-05-23 NOTE — Op Note (Signed)
NAME:  [name], DONAHOE                 ACCOUNT NO.:  [id number]   MEDICAL RECORD NO.:  [id number]          PATIENT TYPE:  AMB   LOCATION:  DSC                          FACILITY:  [facility]   PHYSICIAN:  Harvie Junior, M.D.   DATE OF BIRTH:  1935-02-28   DATE OF PROCEDURE:  08/10/2005  DATE OF DISCHARGE:                                 OPERATIVE REPORT   PREOPERATIVE DIAGNOSIS:  Carpal tunnel syndrome, left.   POSTOPERATIVE DIAGNOSIS:  Carpal tunnel syndrome, left.   PRINCIPAL PROCEDURE:  Left carpal tunnel release.   SURGEON:  Harvie Junior, M.D.   ASSISTANT:  Marshia Ly, PA   ANESTHESIA:  Forearm-based IV regional.   BRIEF HISTORY:  A 76 year old male with a long history of having significant  bilateral significant numbness and tingling in his thumb, index, and long  fingers.  He had been worked up thoroughly in the office and felt to have  carpal tunnel.  He was sent up for EMGs, which confirmed the diagnosis.  We  talked about treatment options and felt carpal tunnel release was the most  appropriate course of action.  He brought to the operating room for this  procedure.   PROCEDURE:  The patient was brought to the operating room and after a  adequate anesthesia had been obtained with forearm-based IV regional, the  patient was placed supine on the operating table, and the left arm was then  prepped and draped in the usual sterile fashion.  Following this, a curved  incision was made just ulnar to the midline of the wrist.  This was taken  through the subcutaneous tissue down to the level of the volar carpal  ligament, clearly identified, and the fibers were divided.  The ligament was  then divided proximally and distally.  A gloved finger could be placed in  the wound proximally and distally, and interestingly the median nerve  tracked slightly more medial than you typically see, or more toward the  thumb side.  We identified the nerve, we freed it up, we looked at the  motor  branch, and these all seemed to be freed up well.  The nerve was  significantly constricted and discolored, and ultimately it just seemed to  be in a more medial position than we typically see, and at that point we  ultimately elected to leave the nerve; it did not make sense to try to track  it more medially, but we made sure it was freed up and there was nothing  adhering it or holding it over toward the thumb side.  Once this was  accomplished, the wound was copiously irrigated and suctioned dry.  The  wound was then closed with a combination of interrupted and running sutures.  Sterile compressive dressing was applied as well as volar plaster.  The  patient was taken to the recovery room and noted to be in satisfactory  condition.  Estimated blood loss for the procedure was none.     Harvie Junior, M.D.  Electronically Signed    JLG/MEDQ  D:  08/10/2005  T:  08/11/2005  Job:  [id number]

## 2010-05-23 NOTE — Op Note (Signed)
NAME:  [name], [name]                           ACCOUNT NO.:  [id number]   MEDICAL RECORD NO.:  [id number]                   PATIENT TYPE:  INP   LOCATION:  5006                                 FACILITY:  [facility]   PHYSICIAN:  Harvie Junior, M.D.                DATE OF BIRTH:  [birth year]   DATE OF PROCEDURE:  09/27/2002  DATE OF DISCHARGE:                                 OPERATIVE REPORT   PREOPERATIVE DIAGNOSIS:  A loose painful right total knee.   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   OPERATION PERFORMED:  1. Removal of femoral component.  2. Removal of tibial component.  3. Removal of patellar component.  4. Implantation of an articulated cement spacer.   SURGEON:  Harvie Junior, M.D.   ASSISTANT:  Marshia Ly, P.A.   ANESTHESIA:  General.   INDICATIONS FOR PROCEDURE:  The patient is a 75 year old male with a history  of having total knee replacement.  He did wonderfully initially but after  about a year out he began having pain and effusion.  Because of pain and  effusion, the patient was evaluation in the office.  Multiple serial  aspirations were undertaken showing no evidence of infection. He was  followed with continued pain but ultimately because of continued pain, it  was felt that he need to be explored and he was brought to the operating  room for that procedure.   DESCRIPTION OF PROCEDURE:  The patient was brought to the operating room  where adequate anesthesia was obtained with a general anesthetic.  The  patient was placed supine on the operating table.  The left leg was prepped  and draped in the usual sterile fashion.  Following this, the leg was  exsanguinated for 10 minutes and a blood pressure tourniquet was inflated to  .  Following this, the old incision was exploited.  A medial  parapatellar arthrotomy was undertaken and access was gained into the knee.  Scar tissue was excised and the femoral component was then assessed.  It  came off easily.  There was some sort of poor quality appearing tissue  underneath the femoral component.  We sent the fluid down for Gram stain and  culture.  The Gram stain on that came back negative.  The Gram stain on the  material from underneath the femoral component came back positive for Gram  positive cocci.  We asked that the pathologist read this and it was in fact,  confirmed.  A second source of tissue just from within the knee was sent  down and it also came back positive for Gram positive cocci.  At that point,  a decision was made to remove all components.  The tibial component was  removed.  The femoral component was removed.  The patellar component was  removed.  A stripping of all material was vigorously taken from within the  knee and the knee was then copiously irrigated with a pulsatile lavage  irrigator and suctioned dry.  Three batches of cement were then mixed with  3g of Kefurox and 3.3g of tobramycin.  These were mixed and then articulated  spacers were chosen, one for the tibial component, one for the femoral  component and the wound was then copiously irrigated again and suctioned  dry.  These spacers were put through a range of motion  and there was about  30 degrees of motion.  A knee immobilizer was placed over the wound which  was closed with #1 Vicryl and 0 and 2-0 Vicryl and skin staples.  Sterile  compressive dressing was applied.  A medium Hemovac drain had been in place  prior to this. Sterile compressive dressing was applied as well as knee  immobilizer.  The patient was taken to the recovery room where he was noted  to be in satisfactory condition.  The estimated blood loss for this  procedure was none.                                                Harvie Junior, M.D.    Ranae Plumber  D:  09/27/2002  T:  09/28/2002  Job:  [id number]

## 2010-05-23 NOTE — Op Note (Signed)
NAME:  [name], [name]                           ACCOUNT NO.:  [id number]   MEDICAL RECORD NO.:  [id number]                   PATIENT TYPE:  OIB   LOCATION:  5010                                 FACILITY:  [facility]   PHYSICIAN:  Harvie Junior, M.D.                DATE OF BIRTH:  [birth year]   DATE OF PROCEDURE:  12/18/2002  DATE OF DISCHARGE:                                 OPERATIVE REPORT   PREOPERATIVE DIAGNOSIS:  Failed total knee.   PROCEDURE:  Left, status post irrigation, debridement, and pulling of parts.   POSTOPERATIVE DIAGNOSIS:  Failed total knee.   PRINCIPAL PROCEDURE:  1. Left total knee revision.  2. Repair of patellar tendon.   SURGEON:  Harvie Junior, M.D.   ASSISTANT:  Marshia Ly, P.A.   ANESTHESIA:  General.   BRIEF HISTORY:  He is a 75 year old male with a long history of having had a  total knee replacement.  He ultimately had pain and effusions, multiple,  around his total knee.  We ultimately followed him for a prolonged period of  time as we could never culture a positive result.  After following him for a  prolonged period of time with no positive cultures, he ultimately was taken  to the operating room for evaluation of components.  At that point, it was  obvious that the components were all loose and thought to be infected.  We  did find an area of infection and had a gram stain positive, never culture  positive but did have a gram stain positive.  We pulled his parts and  treated him with IV antibiotics per infectious disease for six weeks, and  once this was done, we kept him on oral antibiotics until we were able to  bring him to the operating room for reimplantation of component.   PROCEDURE:  The patient was taken to the operating room after adequate  anesthesia was obtained with a general anesthetic.  The patient was placed  supine on the operating table.  The left leg was prepped and draped in a  sterile fashion.  Following this, a linear  incision was made through the  subcutaneous tissue and was carried down to the level of the extensor  mechanism, which was identified, and a medial parapatellar arthrotomy was  undertaken.  A large amount of bloody-type fluid was encountered at this  point, and this was sent to the lab for gram stain and culture.  All of the  membranous material then in the knee was debrided, and this was sent to the  lab.  No increased number of PMNs were identified in this area.  The bulk of  the sample was sent from the distal femur.  At this point, the attention was  turned towards establishing the joint line.  The tibia was opened to a level  of 20.  The tibial trial component was  then put in place.  Following this,  attention was turned to the femur, which was reamed up with a 20.  The  anterior and posterior clamp cuts were made.  The distal cut was made, a 5-  degree valgus.  The anterior-posterior chamfers were cut, and the trial was  assembled with an 8-degree distal build-up and a 4-degree posterior build-  up.  This trial was established with a 15 mm bearing.  Excellent flexion and  extension gaps were achieved.  No instability distal.  At this point, the  trial components were removed, and the attention was turned towards  cementing in the final components.  It was a size 4 with an 18 mm distal  stem.  A 15 mm bearing was used at that point and then a size 4 femoral stem  with a 22 femoral stem, and this was built up with an 8 mm distal and a 4 mm  posterior build up.  Excellent fixation was achieved with that.  The  anterior portion was cemented.  Two Hemovac drains were then placed, and  attention was turned to the patella.  The patella was really in bad shape  basically with a central ovoid defect, and a 38 mm patella was then cemented  into the old peg holes, which were freshened up at this point.  The patellar  tendon, which basically had been ripped during the gaining of exposure was   repaired distally to the patellar tubercle with two 5 mm Stay-Tack suture  anchors with #2 Ethibond suture.  Excellent repair of the patellar tendon  was achieved and could flex easily to 90 degrees.  That was not the limiting  factor of flexion at that point.  There was some slight lateral tracking of  the patella, and a lateral retinacular release was undertaken to medialize  the patellar tracking.  At this point, the medial parapatellar arthrotomy  was closed, and excellent flexion with midline patellar tracking was  achieved at this point.  The knee was then copiously irrigated and suctioned  dry, and the tourniquet was let down at 2 hours and 14 minutes of tourniquet  time.  The Autovac drain was then hooked up at this point.  The skin was  closed with a combination of 0 and 2-0 Vicryl and the skin with skin  staples.  A sterile compressive dressing was applied.  The patient was taken  to the recovery room.  She was noted to be in satisfactory condition.  The  estimated blood loss during the procedure was 250 mL.                                               Harvie Junior, M.D.    Ranae Plumber  D:  12/18/2002  T:  12/19/2002  Job:  [id number]

## 2010-07-23 NOTE — Discharge Summary (Signed)
NAMEORRY, SIGL NO.:  [id number]  MEDICAL RECORD NO.:  [id number]  LOCATION:  5016                         FACILITY:  [facility]  PHYSICIAN:  Harvie Junior, M.D.   DATE OF BIRTH:  August 11, 1935  DATE OF ADMISSION:  05/13/2010 DATE OF DISCHARGE:  05/17/2010                              DISCHARGE SUMMARY   ADMITTING DIAGNOSES: 1. Displaced left distal femur fracture, periprosthetic fracture above     left total knee replacement. 2. History of EtOH abuse. 3. Hypertension. 4. Diabetes mellitus. 5. 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 6. Status post bilateral total shoulder replacements. 7. Status post right total hip arthroplasty.  DISCHARGE DIAGNOSES: 1. Displaced left distal femur fracture, periprosthetic fracture above     left total knee replacement. 2. History of EtOH abuse. 3. Hypertension. 4. Diabetes mellitus. 5. 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 6. Status post bilateral total shoulder replacements. 7. Status post right total hip arthroplasty. 8. Acute blood loss anemia.  BRIEF HISTORY:  [name] is a 75 year old male who is well known to [place].  He had a fall at home today prior to this admission.  He fell twisting his left leg.  He complained of pain in his left distal thigh area and inability to weight bear.  He came to the [facility] Emergency Room where x-ray showed a spiral left distal femur fracture above his revision total knee prosthesis.  He is admitted for treatment of his left lower extremity significant injury.  PERTINENT LABORATORY STUDIES:  His hemoglobin on admission was 14.0 with a hematocrit of 38.  His potassium was 3.2.  His vital signs were stable at this point.  On May 14, 2010, EKG showed sinus rhythm with first- degree AV block, otherwise normal EKG.  X-ray of the left femur, two view showed internal fixation of the  left distal femoral fracture. Hemoglobin on May 15, 2010, was 9.6, hematocrit of 27.2.  On May 16, 2010, hemoglobin was 7.3.  On May 17, 2010, after transfusion of packed RBCs, hemoglobin was 8.9.  His protime on admission was 14.7 seconds with an INR of 1.13 on Coumadin therapy.  On May 17, 2010, date of discharge, protime was 16.5 seconds with an INR of 1.31.  His BMET showed a stable potassium through his hospitalization.  His glucose was mildly elevated from 136-177.  BUN and creatinine were normal.  The EGFR was greater than 60.  2 units of packed RBCs were transfused on May 16, 2010.  Tissue culture on May 14, 2010, from the left leg showed no growth in 2 days.  This was from his left total knee replacement region.  No anaerobes were isolated.  His MRSA and staph aureus screen tests were negative.  HOSPITAL COURSE:  The patient was admitted to [facility] and was brought to the operating room on his day of admission, May 14, 2010, where he underwent significant surgery on his left lower extremity, had a very large plate, screws, and cables applied to his left femur.  He  was put on IV antibiotics preoperatively as well as postoperatively.  He is on Coumadin for DVT prophylaxis.  A PCA morphine pump was used for pain control.  Physical therapy was ordered for walker ambulation, nonweightbearing on the left lower extremity.  On postop day #1, he had moderate pain.  He was putting out good urine through the Foley catheter, which had been placed at the time of surgery.  He has gotten out of bed with therapy.  IV pain medicine was ordered with IV fluids. On postop day #2, the said he felt tired.  He was taking fluids and voiding without difficulty.  Foley catheter had been discontinued on postop day #1.  His vital signs were stable.  His O2 sats were 96% on room air.  His hemoglobin was 7.3.  His INR was 1.31.  CBG showed a blood sugar of 156.  His left thigh was moderately  swollen.  His dressing was clean and dry and view was intact distally.  He was felt to have acute blood loss anemia and 2 units of packed RBCs were transfused. His hemoglobin was checked the following morning.  He was seen by physical therapy and was allowed to only be just touchdown weightbearing on the left side.  We monitored his urine output during his hospitalization.  On May 17, 2010, his vital signs were stable.  His hemoglobin was 8.9.  He had a bowel movement and positive bowel sounds. His left knee dressing was clean and dry.  He was discharged home in improved condition.  He was afebrile.  His vital signs were stable, and neurovascular status was intact to the left lower extremity.  He had been on a regular diabetic diet at home.  He will need home health physical therapy and home health RN for Coumadin management for DVT prophylaxis until he is 1 month postop.  His activity status will be ambulating, touchdown weightbearing only on the left with a walker.  His medications at discharge will include: 1. Amlodipine 10 mg 1 daily. 2. Artificial tears over the counter 1 drop each eye daily. 3. Atenolol 50 mg 1 daily. 4. Celebrex 200 mg 1 daily. 5. Clonidine 0.2 mg 1 b.i.d. 6. Crestor 10 mg 1 daily. 7. Colcrys 0.6 mg 1 daily as needed. 8. Fish oil over the counter 1 every other day. 9. Flaxseed oil 1 every other day. 10.Lasix 20 mg 1 daily. 11.Hydrocodone 10 mg 1 q.4-6 h. p.r.n. pain. 12.Iron 600 mg OTC 1 daily. 13.Lyrica 75 mg q.a.m. and 150 mg q.p.m. 14.We will also add Percocet 5 mg 1-2 every 6 hours for more severe     pain. 15.Coumadin 5 mg 1 daily unless otherwise directed. 16.Nasonex nasal spray 1 spray in each nostril p.r.n. 17.Triamterene/hydrochlorothiazide 37.5/25 mg 1 daily. 18.Uloric 40 mg 1 daily.  He will follow up Dr. Luiz Blare in the office in 2 weeks' time, sooner if any problems occur.     Marshia Ly, P.A.   ______________________________ Harvie Junior, M.D.    JB/MEDQ  D:  07/18/2010  T:  07/19/2010  Job:  [id number]  Electronically Signed by Marshia Ly P.A. on 07/23/2010 04:42:22 PM Electronically Signed by Jodi Geralds M.D. on 07/23/2010 09:09:07 PM

## 2010-10-15 DIAGNOSIS — Z96659 Presence of unspecified artificial knee joint: Secondary | ICD-10-CM | POA: Insufficient documentation

## 2010-10-15 DIAGNOSIS — S7292XA Unspecified fracture of left femur, initial encounter for closed fracture: Secondary | ICD-10-CM | POA: Insufficient documentation

## 2012-06-03 ENCOUNTER — Other Ambulatory Visit: Payer: Self-pay | Admitting: Family Medicine

## 2012-06-03 DIAGNOSIS — Z136 Encounter for screening for cardiovascular disorders: Secondary | ICD-10-CM

## 2012-06-22 ENCOUNTER — Other Ambulatory Visit: Payer: Medicare Other

## 2012-07-25 ENCOUNTER — Ambulatory Visit
Admission: RE | Admit: 2012-07-25 | Discharge: 2012-07-25 | Disposition: A | Discharge status: Home / Self Care | Payer: Medicare Other | Source: Ambulatory Visit | Attending: Family Medicine | Admitting: Family Medicine

## 2012-07-25 DIAGNOSIS — Z136 Encounter for screening for cardiovascular disorders: Secondary | ICD-10-CM

## 2013-09-26 DIAGNOSIS — Z96641 Presence of right artificial hip joint: Secondary | ICD-10-CM | POA: Insufficient documentation

## 2014-05-04 ENCOUNTER — Emergency Department (HOSPITAL_COMMUNITY): Payer: Medicare Other

## 2014-05-04 ENCOUNTER — Other Ambulatory Visit (HOSPITAL_COMMUNITY): Payer: Self-pay

## 2014-05-04 ENCOUNTER — Inpatient Hospital Stay (HOSPITAL_COMMUNITY)
Admission: EM | Admit: 2014-05-04 | Discharge: 2014-05-24 | DRG: 500 | Disposition: A | Discharge status: Skilled Nursing Facility | Payer: Medicare Other | Attending: Internal Medicine | Admitting: Internal Medicine

## 2014-05-04 ENCOUNTER — Encounter (HOSPITAL_COMMUNITY): Payer: Self-pay

## 2014-05-04 DIAGNOSIS — R109 Unspecified abdominal pain: Secondary | ICD-10-CM

## 2014-05-04 DIAGNOSIS — A409 Streptococcal sepsis, unspecified: Secondary | ICD-10-CM | POA: Diagnosis not present

## 2014-05-04 DIAGNOSIS — R4182 Altered mental status, unspecified: Secondary | ICD-10-CM | POA: Diagnosis not present

## 2014-05-04 DIAGNOSIS — I63412 Cerebral infarction due to embolism of left middle cerebral artery: Secondary | ICD-10-CM | POA: Diagnosis not present

## 2014-05-04 DIAGNOSIS — F10239 Alcohol dependence with withdrawal, unspecified: Secondary | ICD-10-CM | POA: Diagnosis not present

## 2014-05-04 DIAGNOSIS — A4181 Sepsis due to Enterococcus: Secondary | ICD-10-CM | POA: Diagnosis present

## 2014-05-04 DIAGNOSIS — Y838 Other surgical procedures as the cause of abnormal reaction of the patient, or of later complication, without mention of misadventure at the time of the procedure: Secondary | ICD-10-CM | POA: Diagnosis not present

## 2014-05-04 DIAGNOSIS — S065XAA Traumatic subdural hemorrhage with loss of consciousness status unknown, initial encounter: Secondary | ICD-10-CM | POA: Diagnosis present

## 2014-05-04 DIAGNOSIS — I4891 Unspecified atrial fibrillation: Secondary | ICD-10-CM | POA: Diagnosis not present

## 2014-05-04 DIAGNOSIS — E876 Hypokalemia: Secondary | ICD-10-CM | POA: Diagnosis not present

## 2014-05-04 DIAGNOSIS — B955 Unspecified streptococcus as the cause of diseases classified elsewhere: Secondary | ICD-10-CM | POA: Diagnosis present

## 2014-05-04 DIAGNOSIS — M1990 Unspecified osteoarthritis, unspecified site: Secondary | ICD-10-CM | POA: Diagnosis present

## 2014-05-04 DIAGNOSIS — M109 Gout, unspecified: Secondary | ICD-10-CM | POA: Diagnosis present

## 2014-05-04 DIAGNOSIS — K6812 Psoas muscle abscess: Secondary | ICD-10-CM

## 2014-05-04 DIAGNOSIS — R509 Fever, unspecified: Secondary | ICD-10-CM | POA: Diagnosis not present

## 2014-05-04 DIAGNOSIS — E119 Type 2 diabetes mellitus without complications: Secondary | ICD-10-CM | POA: Diagnosis not present

## 2014-05-04 DIAGNOSIS — A419 Sepsis, unspecified organism: Secondary | ICD-10-CM

## 2014-05-04 DIAGNOSIS — J9811 Atelectasis: Secondary | ICD-10-CM | POA: Diagnosis not present

## 2014-05-04 DIAGNOSIS — I481 Persistent atrial fibrillation: Secondary | ICD-10-CM | POA: Diagnosis present

## 2014-05-04 DIAGNOSIS — F1020 Alcohol dependence, uncomplicated: Secondary | ICD-10-CM | POA: Diagnosis present

## 2014-05-04 DIAGNOSIS — Z96653 Presence of artificial knee joint, bilateral: Secondary | ICD-10-CM | POA: Diagnosis present

## 2014-05-04 DIAGNOSIS — M25551 Pain in right hip: Secondary | ICD-10-CM

## 2014-05-04 DIAGNOSIS — I4819 Other persistent atrial fibrillation: Secondary | ICD-10-CM | POA: Diagnosis present

## 2014-05-04 DIAGNOSIS — L0291 Cutaneous abscess, unspecified: Secondary | ICD-10-CM

## 2014-05-04 DIAGNOSIS — T8451XA Infection and inflammatory reaction due to internal right hip prosthesis, initial encounter: Secondary | ICD-10-CM | POA: Diagnosis present

## 2014-05-04 DIAGNOSIS — I6200 Nontraumatic subdural hemorrhage, unspecified: Secondary | ICD-10-CM | POA: Diagnosis not present

## 2014-05-04 DIAGNOSIS — R7881 Bacteremia: Secondary | ICD-10-CM

## 2014-05-04 DIAGNOSIS — R404 Transient alteration of awareness: Secondary | ICD-10-CM | POA: Diagnosis not present

## 2014-05-04 DIAGNOSIS — Z87891 Personal history of nicotine dependence: Secondary | ICD-10-CM | POA: Diagnosis not present

## 2014-05-04 DIAGNOSIS — Z79899 Other long term (current) drug therapy: Secondary | ICD-10-CM | POA: Diagnosis not present

## 2014-05-04 DIAGNOSIS — A408 Other streptococcal sepsis: Secondary | ICD-10-CM | POA: Diagnosis not present

## 2014-05-04 DIAGNOSIS — F05 Delirium due to known physiological condition: Secondary | ICD-10-CM | POA: Diagnosis not present

## 2014-05-04 DIAGNOSIS — R0600 Dyspnea, unspecified: Secondary | ICD-10-CM

## 2014-05-04 DIAGNOSIS — G9340 Encephalopathy, unspecified: Secondary | ICD-10-CM | POA: Diagnosis present

## 2014-05-04 DIAGNOSIS — I639 Cerebral infarction, unspecified: Secondary | ICD-10-CM | POA: Diagnosis not present

## 2014-05-04 DIAGNOSIS — E1140 Type 2 diabetes mellitus with diabetic neuropathy, unspecified: Secondary | ICD-10-CM | POA: Diagnosis present

## 2014-05-04 DIAGNOSIS — M009 Pyogenic arthritis, unspecified: Secondary | ICD-10-CM | POA: Diagnosis present

## 2014-05-04 DIAGNOSIS — K651 Peritoneal abscess: Secondary | ICD-10-CM

## 2014-05-04 DIAGNOSIS — Z4659 Encounter for fitting and adjustment of other gastrointestinal appliance and device: Secondary | ICD-10-CM

## 2014-05-04 DIAGNOSIS — I6521 Occlusion and stenosis of right carotid artery: Secondary | ICD-10-CM | POA: Diagnosis present

## 2014-05-04 DIAGNOSIS — S065X9A Traumatic subdural hemorrhage with loss of consciousness of unspecified duration, initial encounter: Secondary | ICD-10-CM | POA: Diagnosis present

## 2014-05-04 DIAGNOSIS — I10 Essential (primary) hypertension: Secondary | ICD-10-CM | POA: Diagnosis present

## 2014-05-04 DIAGNOSIS — R0602 Shortness of breath: Secondary | ICD-10-CM | POA: Diagnosis not present

## 2014-05-04 DIAGNOSIS — Z96641 Presence of right artificial hip joint: Secondary | ICD-10-CM | POA: Diagnosis present

## 2014-05-04 DIAGNOSIS — Z888 Allergy status to other drugs, medicaments and biological substances status: Secondary | ICD-10-CM | POA: Diagnosis not present

## 2014-05-04 DIAGNOSIS — K650 Generalized (acute) peritonitis: Secondary | ICD-10-CM | POA: Diagnosis not present

## 2014-05-04 DIAGNOSIS — R0682 Tachypnea, not elsewhere classified: Secondary | ICD-10-CM

## 2014-05-04 HISTORY — DX: Gout, unspecified: M10.9

## 2014-05-04 HISTORY — DX: Sepsis, unspecified organism: A41.9

## 2014-05-04 HISTORY — DX: Bacteremia: R78.81

## 2014-05-04 HISTORY — DX: Unspecified atrial fibrillation: I48.91

## 2014-05-04 HISTORY — DX: Encephalopathy, unspecified: G93.40

## 2014-05-04 HISTORY — DX: Essential (primary) hypertension: I10

## 2014-05-04 HISTORY — DX: Alcohol dependence, uncomplicated: F10.20

## 2014-05-04 HISTORY — DX: Occlusion and stenosis of right carotid artery: I65.21

## 2014-05-04 HISTORY — DX: Psoas muscle abscess: K68.12

## 2014-05-04 HISTORY — DX: Cerebral infarction due to embolism of left middle cerebral artery: I63.412

## 2014-05-04 HISTORY — DX: Traumatic subdural hemorrhage with loss of consciousness of unspecified duration, initial encounter: S06.5X9A

## 2014-05-04 HISTORY — DX: Unspecified streptococcus as the cause of diseases classified elsewhere: B95.5

## 2014-05-04 HISTORY — DX: Type 2 diabetes mellitus without complications: E11.9

## 2014-05-04 HISTORY — DX: Unspecified osteoarthritis, unspecified site: M19.90

## 2014-05-04 HISTORY — DX: Pyogenic arthritis, unspecified: M00.9

## 2014-05-04 LAB — COMPREHENSIVE METABOLIC PANEL
ALK PHOS: 111 U/L (ref 39–117)
ALT: 92 U/L — AB (ref 0–53)
ANION GAP: 17 — AB (ref 5–15)
AST: 152 U/L — ABNORMAL HIGH (ref 0–37)
Albumin: 3.6 g/dL (ref 3.5–5.2)
BILIRUBIN TOTAL: 1.6 mg/dL — AB (ref 0.3–1.2)
BUN: 38 mg/dL — ABNORMAL HIGH (ref 6–23)
CO2: 22 mmol/L (ref 19–32)
CREATININE: 0.78 mg/dL (ref 0.50–1.35)
Calcium: 8.9 mg/dL (ref 8.4–10.5)
Chloride: 94 mmol/L — ABNORMAL LOW (ref 96–112)
GFR calc Af Amer: >90 mL/min (ref >90)
GFR, EST NON AFRICAN AMERICAN: 85 mL/min — AB (ref >90)
GLUCOSE: 146 mg/dL — AB (ref 70–99)
POTASSIUM: 3.6 mmol/L (ref 3.5–5.1)
Sodium: 133 mmol/L — ABNORMAL LOW (ref 135–145)
TOTAL PROTEIN: 7.4 g/dL (ref 6.0–8.3)

## 2014-05-04 LAB — URINE MICROSCOPIC-ADD ON

## 2014-05-04 LAB — CBC WITH DIFFERENTIAL/PLATELET
BASOS ABS: 0 10*3/uL (ref 0.0–0.1)
BASOS PCT: 0 % (ref 0–1)
EOS ABS: 0 10*3/uL (ref 0.0–0.7)
Eosinophils Relative: 0 % (ref 0–5)
HEMATOCRIT: 37.7 % — AB (ref 39.0–52.0)
HEMOGLOBIN: 13 g/dL (ref 13.0–17.0)
Lymphocytes Relative: 9 % — ABNORMAL LOW (ref 12–46)
Lymphs Abs: 0.9 10*3/uL (ref 0.7–4.0)
MCH: 32.8 pg (ref 26.0–34.0)
MCHC: 34.5 g/dL (ref 30.0–36.0)
MCV: 95.2 fL (ref 78.0–100.0)
MONO ABS: 1.4 10*3/uL — AB (ref 0.1–1.0)
Monocytes Relative: 13 % — ABNORMAL HIGH (ref 3–12)
NEUTROS PCT: 78 % — AB (ref 43–77)
Neutro Abs: 8 10*3/uL — ABNORMAL HIGH (ref 1.7–7.7)
Platelets: 180 10*3/uL (ref 150–400)
RBC: 3.96 MIL/uL — AB (ref 4.22–5.81)
RDW: 13.4 % (ref 11.5–15.5)
WBC: 10.3 10*3/uL (ref 4.0–10.5)

## 2014-05-04 LAB — URINALYSIS, ROUTINE W REFLEX MICROSCOPIC
Bilirubin Urine: NEGATIVE
Glucose, UA: NEGATIVE mg/dL
KETONES UR: NEGATIVE mg/dL
Leukocytes, UA: NEGATIVE
Nitrite: NEGATIVE
PROTEIN: NEGATIVE mg/dL
SPECIFIC GRAVITY, URINE: 1.016 (ref 1.005–1.030)
Urobilinogen, UA: 2 mg/dL — ABNORMAL HIGH (ref 0.0–1.0)
pH: 6 (ref 5.0–8.0)

## 2014-05-04 LAB — I-STAT CG4 LACTIC ACID, ED
Lactic Acid, Venous: 2.38 mmol/L (ref 0.5–2.0)
Lactic Acid, Venous: 1.27 mmol/L (ref 0.5–2.0)

## 2014-05-04 LAB — POC OCCULT BLOOD, ED: Fecal Occult Bld: NEGATIVE

## 2014-05-04 MED ORDER — ACETAMINOPHEN 325 MG PO TABS
650.0000 mg | ORAL_TABLET | Freq: Four times a day (QID) | ORAL | Status: DC | PRN
  Administered 2014-05-15 – 2014-05-18 (×3): 650 mg via ORAL
  Filled 2014-05-04 (×4): qty 2

## 2014-05-04 MED ORDER — SODIUM CHLORIDE 0.9 % IV SOLN
INTRAVENOUS | Status: AC
  Administered 2014-05-05 (×2): via INTRAVENOUS

## 2014-05-04 MED ORDER — LORAZEPAM 1 MG PO TABS: 1.0000 mg | ORAL_TABLET | Freq: Four times a day (QID) | ORAL | Status: DC | PRN

## 2014-05-04 MED ORDER — INSULIN ASPART 100 UNIT/ML ~~LOC~~ SOLN
0.0000 [IU] | SUBCUTANEOUS | Status: DC
  Administered 2014-05-05: 2 [IU] via SUBCUTANEOUS
  Administered 2014-05-05: 1 [IU] via SUBCUTANEOUS
  Administered 2014-05-05 – 2014-05-06 (×3): 2 [IU] via SUBCUTANEOUS
  Administered 2014-05-06 (×2): 1 [IU] via SUBCUTANEOUS
  Administered 2014-05-06: 2 [IU] via SUBCUTANEOUS
  Administered 2014-05-06: 1 [IU] via SUBCUTANEOUS
  Administered 2014-05-06 – 2014-05-07 (×2): 2 [IU] via SUBCUTANEOUS
  Administered 2014-05-07: 08:00:00 via SUBCUTANEOUS
  Administered 2014-05-07: 1 [IU] via SUBCUTANEOUS
  Administered 2014-05-07 – 2014-05-08 (×4): 2 [IU] via SUBCUTANEOUS
  Administered 2014-05-08: 1 [IU] via SUBCUTANEOUS
  Administered 2014-05-08 – 2014-05-09 (×9): 2 [IU] via SUBCUTANEOUS
  Administered 2014-05-09 – 2014-05-10 (×2): 1 [IU] via SUBCUTANEOUS
  Administered 2014-05-10: 2 [IU] via SUBCUTANEOUS
  Administered 2014-05-10 (×2): 1 [IU] via SUBCUTANEOUS
  Administered 2014-05-10 – 2014-05-11 (×6): 2 [IU] via SUBCUTANEOUS
  Administered 2014-05-11: 1 [IU] via SUBCUTANEOUS
  Administered 2014-05-12 – 2014-05-13 (×3): 2 [IU] via SUBCUTANEOUS
  Administered 2014-05-13 (×2): 1 [IU] via SUBCUTANEOUS
  Administered 2014-05-13 (×2): 2 [IU] via SUBCUTANEOUS
  Administered 2014-05-14 (×3): 1 [IU] via SUBCUTANEOUS
  Administered 2014-05-14: 2 [IU] via SUBCUTANEOUS
  Administered 2014-05-14 (×2): 1 [IU] via SUBCUTANEOUS
  Administered 2014-05-15 (×2): 2 [IU] via SUBCUTANEOUS
  Administered 2014-05-15: 1 [IU] via SUBCUTANEOUS
  Administered 2014-05-15: 2 [IU] via SUBCUTANEOUS
  Administered 2014-05-15 – 2014-05-16 (×6): 1 [IU] via SUBCUTANEOUS
  Administered 2014-05-17 (×2): 2 [IU] via SUBCUTANEOUS
  Administered 2014-05-17 – 2014-05-18 (×5): 1 [IU] via SUBCUTANEOUS
  Administered 2014-05-18: 2 [IU] via SUBCUTANEOUS
  Administered 2014-05-18 – 2014-05-19 (×5): 1 [IU] via SUBCUTANEOUS
  Administered 2014-05-19: 2 [IU] via SUBCUTANEOUS
  Administered 2014-05-20 (×5): 1 [IU] via SUBCUTANEOUS
  Administered 2014-05-21: 2 [IU] via SUBCUTANEOUS
  Administered 2014-05-21 – 2014-05-22 (×8): 1 [IU] via SUBCUTANEOUS

## 2014-05-04 MED ORDER — PIPERACILLIN-TAZOBACTAM 3.375 G IVPB
3.3750 g | Freq: Three times a day (TID) | INTRAVENOUS | Status: DC
  Administered 2014-05-04 – 2014-05-09 (×14): 3.375 g via INTRAVENOUS
  Filled 2014-05-04 (×14): qty 50

## 2014-05-04 MED ORDER — VITAMIN B-1 100 MG PO TABS
100.0000 mg | ORAL_TABLET | Freq: Every day | ORAL | Status: DC
  Administered 2014-05-05 – 2014-05-24 (×10): 100 mg via ORAL
  Filled 2014-05-04 (×10): qty 1

## 2014-05-04 MED ORDER — PIPERACILLIN-TAZOBACTAM 3.375 G IVPB 30 MIN
3.3750 g | Freq: Once | INTRAVENOUS | Status: AC
  Administered 2014-05-04: 3.375 g via INTRAVENOUS
  Filled 2014-05-04: qty 50

## 2014-05-04 MED ORDER — TRAMADOL HCL 50 MG PO TABS
100.0000 mg | ORAL_TABLET | Freq: Once | ORAL | Status: AC
  Administered 2014-05-04: 100 mg via ORAL
  Filled 2014-05-04: qty 2

## 2014-05-04 MED ORDER — ACETAMINOPHEN 500 MG PO TABS
1000.0000 mg | ORAL_TABLET | Freq: Once | ORAL | Status: AC
  Administered 2014-05-04: 1000 mg via ORAL
  Filled 2014-05-04: qty 2

## 2014-05-04 MED ORDER — ONDANSETRON HCL 4 MG PO TABS: 4.0000 mg | ORAL_TABLET | Freq: Four times a day (QID) | ORAL | Status: DC | PRN

## 2014-05-04 MED ORDER — FOLIC ACID 1 MG PO TABS
1.0000 mg | ORAL_TABLET | Freq: Every day | ORAL | Status: DC
  Administered 2014-05-05 – 2014-05-14 (×3): 1 mg via ORAL
  Filled 2014-05-04 (×4): qty 1

## 2014-05-04 MED ORDER — LORAZEPAM 2 MG/ML IJ SOLN
1.0000 mg | Freq: Four times a day (QID) | INTRAMUSCULAR | Status: DC | PRN
  Administered 2014-05-05 – 2014-05-07 (×6): 1 mg via INTRAVENOUS
  Filled 2014-05-04 (×4): qty 1

## 2014-05-04 MED ORDER — VANCOMYCIN HCL IN DEXTROSE 1-5 GM/200ML-% IV SOLN
1000.0000 mg | INTRAVENOUS | Status: AC
  Administered 2014-05-04: 1000 mg via INTRAVENOUS
  Filled 2014-05-04: qty 200

## 2014-05-04 MED ORDER — VANCOMYCIN HCL IN DEXTROSE 750-5 MG/150ML-% IV SOLN
750.0000 mg | Freq: Two times a day (BID) | INTRAVENOUS | Status: DC
  Administered 2014-05-05 – 2014-05-08 (×7): 750 mg via INTRAVENOUS
  Filled 2014-05-04 (×9): qty 150

## 2014-05-04 MED ORDER — ACETAMINOPHEN 650 MG RE SUPP
650.0000 mg | Freq: Four times a day (QID) | RECTAL | Status: DC | PRN
  Administered 2014-05-06 (×2): 650 mg via RECTAL
  Filled 2014-05-04 (×3): qty 1

## 2014-05-04 MED ORDER — MORPHINE SULFATE 2 MG/ML IJ SOLN
1.0000 mg | INTRAMUSCULAR | Status: DC | PRN
  Administered 2014-05-05 – 2014-05-06 (×5): 1 mg via INTRAVENOUS
  Filled 2014-05-04 (×6): qty 1

## 2014-05-04 MED ORDER — SODIUM CHLORIDE 0.9 % IV BOLUS (SEPSIS)
500.0000 mL | INTRAVENOUS | Status: AC
  Administered 2014-05-04: 500 mL via INTRAVENOUS

## 2014-05-04 MED ORDER — ADULT MULTIVITAMIN W/MINERALS CH
1.0000 | ORAL_TABLET | Freq: Every day | ORAL | Status: DC
  Administered 2014-05-05 – 2014-05-24 (×11): 1 via ORAL
  Filled 2014-05-04 (×12): qty 1

## 2014-05-04 MED ORDER — THIAMINE HCL 100 MG/ML IJ SOLN
100.0000 mg | Freq: Every day | INTRAMUSCULAR | Status: DC
  Administered 2014-05-06 – 2014-05-19 (×7): 100 mg via INTRAVENOUS
  Filled 2014-05-04 (×9): qty 2

## 2014-05-04 MED ORDER — VANCOMYCIN HCL IN DEXTROSE 1-5 GM/200ML-% IV SOLN
1000.0000 mg | Freq: Once | INTRAVENOUS | Status: AC
  Administered 2014-05-04: 1000 mg via INTRAVENOUS
  Filled 2014-05-04: qty 200

## 2014-05-04 MED ORDER — FEBUXOSTAT 40 MG PO TABS
80.0000 mg | ORAL_TABLET | Freq: Every day | ORAL | Status: DC
  Administered 2014-05-05 – 2014-05-24 (×11): 80 mg via ORAL
  Filled 2014-05-04 (×21): qty 2

## 2014-05-04 MED ORDER — SODIUM CHLORIDE 0.9 % IV BOLUS (SEPSIS): 500.0000 mL | Freq: Once | INTRAVENOUS | Status: DC

## 2014-05-04 MED ORDER — LORAZEPAM 2 MG/ML IJ SOLN
0.0000 mg | Freq: Two times a day (BID) | INTRAMUSCULAR | Status: DC
  Filled 2014-05-04: qty 1

## 2014-05-04 MED ORDER — METOPROLOL TARTRATE 1 MG/ML IV SOLN
2.5000 mg | Freq: Four times a day (QID) | INTRAVENOUS | Status: DC
  Administered 2014-05-05 (×2): 2.5 mg via INTRAVENOUS
  Filled 2014-05-04 (×2): qty 5

## 2014-05-04 MED ORDER — LORAZEPAM 2 MG/ML IJ SOLN
0.0000 mg | Freq: Four times a day (QID) | INTRAMUSCULAR | Status: AC
Start: 2014-05-05 — End: 2014-05-06
  Administered 2014-05-05: 2 mg via INTRAVENOUS
  Administered 2014-05-05: 1 mg via INTRAVENOUS
  Administered 2014-05-05: 2 mg via INTRAVENOUS
  Administered 2014-05-06: 1 mg via INTRAVENOUS
  Administered 2014-05-06: 2 mg via INTRAVENOUS
  Administered 2014-05-06: 1 mg via INTRAVENOUS
  Filled 2014-05-04 (×6): qty 1

## 2014-05-04 MED ORDER — SODIUM CHLORIDE 0.9 % IV BOLUS (SEPSIS)
1000.0000 mL | INTRAVENOUS | Status: AC
  Administered 2014-05-04 (×3): 1000 mL via INTRAVENOUS

## 2014-05-04 MED ORDER — IOHEXOL 300 MG/ML  SOLN
80.0000 mL | Freq: Once | INTRAMUSCULAR | Status: AC | PRN
  Administered 2014-05-04: 80 mL via INTRAVENOUS

## 2014-05-04 MED ORDER — ONDANSETRON HCL 4 MG/2ML IJ SOLN
4.0000 mg | Freq: Four times a day (QID) | INTRAMUSCULAR | Status: DC | PRN
  Administered 2014-05-20: 4 mg via INTRAVENOUS
  Filled 2014-05-04: qty 2

## 2014-05-04 NOTE — Consult Note (Signed)
Re:   YAHYE SIEBERT DOB:   [birth year] MRN:   [id number]  ASSESSMENT AND PLAN: 1.  Right ileopsoas abscess or phlegmon  I reviewed the films with Abigail Miyamoto (his is not an interventionalist), but it looks like IR could stick a needle in it to get fluid out.  There is no obvious communication with the bowel, so not sure of its source.  The area on CT scan is on the dense side suggesting a phlegmon over abscess.    Other options are do an MRI to better characterize the area.  I will talk to IR in the AM.  Agree with current IVF, antibiotics.  2.  History of right hip prosthesis  2009 - [facility] 3.  Diverticulosis 4.  HTN x 50 years. 5.  Mildly elevated LFT's 6.  DM 7.  Diabetic neuropathy - on lyrica 8.  Hyperchosterolemia 9.  Drinks regularly 10.  New onset A. Fib 11.  History of gout   Chief Complaint  Patient presents with  . Fatigue  . Fever   REFERRING PHYSICIAN:   Dr. Orlie Dakin, [facility]  HISTORY OF PRESENT ILLNESS: DAMYON MULLANE is a 79 y.o. (DOB: 10/02/1935)  white  male whose primary care physician is Pcp Not In System (Debis Sublimity, [place] [facility] [place]) and comes to [facility] for right hip pain.  His symptoms started about one week ago when he had chills on Thursday, 4/21.  The next morning he could not bend his leg or get out of bed.  It is not clear why he waited a week to be seen, but he came to the [facility].  He has not GI history - no stomach, liver or colon disease.  He has never had a colonoscopy.  CT of pelvis - 1. Rim enhancing mass along the lower aspect of the iliopsoas muscle, suspicious for abscess or phlegmon. Myositis or other soft tissue mass is also a consideration. Further evaluation with MRI may be helpful.  2. Status post right total hip arthroplasty without adverse features.  3. Significant sigmoid diverticulosis. WBC - 10,300 - 05/04/2014    Past Medical History  Diagnosis Date  . Hypertension   . Arthritis       Past Surgical History    Procedure Laterality Date  . Hip arthroplasty Right   . Joint replacement      right hip, bilateral knees  . Right cataract extraction        Current Facility-Administered Medications  Medication Dose Route Frequency Provider Last Rate Last Dose  . piperacillin-tazobactam (ZOSYN) IVPB 3.375 g  3.375 g Intravenous Q8H Anh P Pham, RPH   3.375 g at 05/04/14 2330  . [START ON 05/05/2014] vancomycin (VANCOCIN) IVPB 750 mg/150 ml premix  750 mg Intravenous Q12H Anh P Pham, RPH          Allergies  Allergen Reactions  . Allopurinol Other (See Comments)    Makes head 'feel funny'  . Motrin [Ibuprofen] Other (See Comments)    Blisters in groin area    REVIEW OF SYSTEMS: Skin:  No history of rash.  No history of abnormal moles. Infection:  No history of hepatitis or HIV.  No history of MRSA. Neurologic:  No history of stroke.  No history of seizure.  No history of headaches. Cardiac:  HTN.   No history of heart disease.  No history of prior cardiac catheterization.  No history of seeing a cardiologist. Pulmonary:  Quit smoking around 1999. No asthma or  bronchitis.  No OSA/CPAP.  Endocrine:  DM.  No thyroid disease.  Admits to drinking a fair amount. Gastrointestinal:  No history of stomach disease.  No history of liver disease.  No history of gall bladder disease.  No history of pancreas disease.  No history of colon disease. Urologic:  No history of kidney stones.  No history of bladder infections. Musculoskeletal:  Right hip replacement - 08/29/2007 - Devonne Doughty.  Left femur.  Left shoulder arthroplasty - 09/03/2003 - J. Berenice Primas.  Right shoulder arthroplasty - 04/20/2003.  Left total knee revision - 12/18/2002.  Removal of right total knee - 09/07/2002 - J. Berenice Primas. Hematologic:  No bleeding disorder.  No history of anemia.  Not anticoagulated. Psycho-social:  The patient is oriented.   The patient has no obvious psychologic or social impairment to understanding our conversation and  plan.  SOCIAL and FAMILY HISTORY: Divorced. Lives with daughter, Nadeen Landau Retired from maintenance at [employer]: BP 164/87 mmHg  Pulse 73  Temp(Src) 98.4 F (36.9 C) (Oral)  Resp 20  Ht 5\' 7"  (1.702 m)  Wt 101.3 kg (223 lb 5.2 oz)  BMI 34.97 kg/m2  SpO2 93%  General: WN older WM who is alert.  Stable in Step down ICU HEENT: Normal. Pupils equal. Neck: Supple. No mass.  No thyroid mass. Lymph Nodes:  No supraclavicular or cervical nodes. Lungs: Clear to auscultation and symmetric breath sounds. Heart:  RRR. No murmur or rub.  Abdomen: Soft. No mass. No tenderness. No hernia. Normal bowel sounds.  No abdominal scars. Rectal: Not done. Extremities:  Pain along right hip to right lateral thigh.  He has had bilateral knee surgery Neurologic:  Complains of some numbness of both feet Psychiatric: Has normal mood and affect. Behavior is normal.   DATA REVIEWED: Epic notes  Alphonsa Overall, MD,  [facility] [address] [place]  [place], [place]    [place] Phone:  [place]:  [phone]

## 2014-05-04 NOTE — Progress Notes (Signed)
ANTIBIOTIC CONSULT NOTE - INITIAL  Pharmacy Consult for vancomycin and zosyn Indication: rule out sepsis  Allergies  Allergen Reactions  . Allopurinol Other (See Comments)    Makes head 'feel funny'  . Motrin [Ibuprofen] Other (See Comments)    Blisters in groin area    Patient Measurements: weight 102 kg, height 67 inches (per patient)    Vital Signs: Temp: 101.5 F (38.6 C) (04/29 1432) Temp Source: Rectal (04/29 1432) BP: 128/72 mmHg (04/29 1432) Pulse Rate: 92 (04/29 1432) Intake/Output from previous day:   Intake/Output from this shift:    Labs: No results for input(s): WBC, HGB, PLT, LABCREA, CREATININE in the last 72 hours. CrCl cannot be calculated (Unknown ideal weight.). No results for input(s): VANCOTROUGH, VANCOPEAK, VANCORANDOM, GENTTROUGH, GENTPEAK, GENTRANDOM, TOBRATROUGH, TOBRAPEAK, TOBRARND, AMIKACINPEAK, AMIKACINTROU, AMIKACIN in the last 72 hours.   Microbiology: No results found for this or any previous visit (from the past 720 hour(s)).  Medical History: Past Medical History  Diagnosis Date  . Hypertension   . Arthritis     Medications:  See med rec   Assessment: Patient's a 79 y.o M presents to the ED with c/o fatigue and "dark urine" for the past couple of days. LA elevated at 2.38.  To start broad abx with vancomycin and zosyn for suspected sepsis.  Vancomycin 1gm and zosyn 3.375gm IV x1 given in the ED at 1622. - scr 0.78 (crcl~63-81 N)  Goal of Therapy:  Vancomycin trough level 15-20 mcg/ml  Plan:  - zosyn 3.375 gm IV q8h (infuse over 4 hours) - vancomycin 1gm IV x1 to get 2 gram total, then 750mg  IV q12h - will obtain vancomycin trough level at steady state   Latrelle Fuston P 05/04/2014,4:06 PM

## 2014-05-04 NOTE — H&P (Signed)
[facility] History and Physical  [name] IEP:[id number] DOB: December 02, 1935 DOA: 05/04/2014  Referring physician: Dr.Jacubowictz. PCP: Pcp Not In System Dr.Koirala. Specialists: None.  Chief Complaint: Right hip pain.  HPI: DEVERY MURGIA is a 79 y.o. male with history of diabetes mellitus type 2, chronic alcoholism, hypertension, gout presents to the ER because of worsening pain in the right hip and right lower quadrant. Patient has been having these symptoms over a week. Patient has associated fever and chills. Progressively patient was not able to ambulate because of the pain. In the ER patient was found to be febrile. CT pelvis shows possible iliopsoas abscess and on-call surgeon Dr. Lucia Gaskins has been consulted. Patient's left leg as it was initially elevated and improved with fluids. In addition patient's EKG shows new onset A. fib. Patient denies any chest pain or shortness of breath. Patient denies any fall or trauma. Has had previous history of surgery to the hip.   Review of Systems: As presented in the history of presenting illness, rest negative.  Past Medical History  Diagnosis Date  . Hypertension   . Arthritis    Past Surgical History  Procedure Laterality Date  . Hip arthroplasty Right   . Joint replacement      right hip, bilateral knees  . Right cataract extraction     Social History:  reports that he has quit smoking. His smoking use included Cigarettes. He smoked 25.00 packs per day. He has never used smokeless tobacco. He reports that he drinks alcohol. He reports that he does not use illicit drugs. Where does patient live home. Can patient participate in ADLs? Yes.  Allergies  Allergen Reactions  . Allopurinol Other (See Comments)    Makes head 'feel funny'  . Motrin [Ibuprofen] Other (See Comments)    Blisters in groin area    Family History:  Family History  Problem Relation Age of Onset  . Diabetes Mellitus II Daughter       Prior to Admission  medications   Medication Sig Start Date End Date Taking? Authorizing Provider  atenolol (TENORMIN) 50 MG tablet Take 50 mg by mouth daily. 04/16/14  Yes Historical Provider, MD  celecoxib (CELEBREX) 200 MG capsule Take 200 mg by mouth 2 (two) times daily. 02/26/14  Yes Historical Provider, MD  CRESTOR 10 MG tablet Take 10 mg by mouth daily. Takes one every other day 03/05/14  Yes Historical Provider, MD  furosemide (LASIX) 20 MG tablet Take 20 mg by mouth daily. 02/21/14  Yes Historical Provider, MD  LYRICA 75 MG capsule Take 75 mg by mouth 2 (two) times daily. 75mg  in the morning and 150mg  at night 04/27/14  Yes Historical Provider, MD  metFORMIN (GLUCOPHAGE) 500 MG tablet Take 1,000 mg by mouth. 02/24/14  Yes Historical Provider, MD  traMADol (ULTRAM) 50 MG tablet Take 100 mg by mouth. Every 4 hours 04/10/14  Yes Historical Provider, MD  ULORIC 80 MG TABS Take 1 tablet by mouth daily. 02/22/14  Yes Historical Provider, MD    Physical Exam: Filed Vitals:   05/04/14 2158 05/04/14 2205 05/04/14 2319 05/04/14 2322  BP: 128/70  164/87   Pulse: 73     Temp:  98.6 F (37 C) 98.4 F (36.9 C)   TempSrc:  Oral Oral   Resp: 18  20   Height:    5\' 7"  (1.702 m)  Weight:   101.3 kg (223 lb 5.2 oz)   SpO2: 93%  General:  Well-developed and well-nourished.  Eyes: Anicteric no pallor.  ENT: No discharge from the ears eyes nose or mouth.  Neck: No mass felt.  Cardiovascular: S1 and S2 heard.  Respiratory: No rhonchi or crepitations.  Abdomen: Mild tenderness in the right lower quadrant. No guarding or rigidity.  Skin: No rash.  Musculoskeletal: Patient has severe pain on moving the right hip.  Psychiatric: Appears normal.  Neurologic: Alert awake oriented to time place and person. Moves all extremities.  Labs on Admission:  Basic Metabolic Panel:  Recent Labs Lab 05/04/14 1500  NA 133*  K 3.6  CL 94*  CO2 22  GLUCOSE 146*  BUN 38*  CREATININE 0.78  CALCIUM 8.9   Liver  Function Tests:  Recent Labs Lab 05/04/14 1500  AST 152*  ALT 92*  ALKPHOS 111  BILITOT 1.6*  PROT 7.4  ALBUMIN 3.6   No results for input(s): LIPASE, AMYLASE in the last 168 hours. No results for input(s): AMMONIA in the last 168 hours. CBC:  Recent Labs Lab 05/04/14 1500  WBC 10.3  NEUTROABS 8.0*  HGB 13.0  HCT 37.7*  MCV 95.2  PLT 180   Cardiac Enzymes: No results for input(s): CKTOTAL, CKMB, CKMBINDEX, TROPONINI in the last 168 hours.  BNP (last 3 results) No results for input(s): BNP in the last 8760 hours.  ProBNP (last 3 results) No results for input(s): PROBNP in the last 8760 hours.  CBG: No results for input(s): GLUCAP in the last 168 hours.  Radiological Exams on Admission: Ct Pelvis W Contrast  05/04/2014   CLINICAL DATA:  Right hip pain and fever for 3 days. History of total hip replacement 2009.  EXAM: CT PELVIS WITH CONTRAST  TECHNIQUE: Multidetector CT imaging of the pelvis was performed using the standard protocol following the bolus administration of intravenous contrast.  CONTRAST:  58mL OMNIPAQUE IOHEXOL 300 MG/ML  SOLN  COMPARISON:  05/04/2014 and MR 02/26/2010  FINDINGS: There is soft tissue density fullness with faint rim enhancement along the lower aspect of the right iliopsoas muscle. This measures 5.2 x 3.5 x 8.5 cm. Centrally this measures soft tissue density. Considerations include abscess, phlegmon, myositis, or other soft tissue mass. Further evaluation with MRI may be helpful. Patient has had previous right total hip arthroplasty. Prosthetic components appear well seated. There is no evidence for periprosthetic loosening. Degenerative changes are seen in the lower lumbar spine. There is dense atherosclerosis of the abdominal aorta. There is significant sigmoid diverticulosis.  IMPRESSION: 1. Rim enhancing mass along the lower aspect of the iliopsoas muscle, suspicious for abscess or phlegmon. Myositis or other soft tissue mass is also a  consideration. Further evaluation with MRI may be helpful. 2. Status post right total hip arthroplasty without adverse features. 3. Significant sigmoid diverticulosis.   Electronically Signed   By: Nolon Nations M.D.   On: 05/04/2014 20:39   Dg Chest Port 1 View  05/04/2014   CLINICAL DATA:  Fever.  Fatigue.  Altered mental status.  EXAM: PORTABLE CHEST - 1 VIEW  COMPARISON:  05/13/2010  FINDINGS: Heart size is within normal limits and stable allowing for portable technique. Atherosclerotic calcification of thoracic aorta again noted. Both lungs are clear. No evidence of pneumothorax or pleural effusion. Bilateral shoulder prostheses again noted.  IMPRESSION: Stable exam.  No active disease.   Electronically Signed   By: Earle Gell M.D.   On: 05/04/2014 16:25   Dg Knee Complete 4 Views Right  05/04/2014   CLINICAL DATA:  Fatigue and fever. Right lower extremity pain. Altered mental status.  EXAM: RIGHT KNEE - COMPLETE 4+ VIEW  COMPARISON:  None.  FINDINGS: Total knee prosthesis observed. No fracture or complicating feature identified. Polymer spacer remains in place.  Distal SFA and popliteal artery atherosclerotic calcification.  IMPRESSION: 1. Total knee prosthesis without complicating feature identified. 2. Atherosclerosis.   Electronically Signed   By: Van Clines M.D.   On: 05/04/2014 16:28   Dg Hip Unilat With Pelvis 2-3 Views Right  05/04/2014   CLINICAL DATA:  Right lateral hip pain. History of arthroplasty. Altered mental status. Denies fall. Fatigue.  EXAM: RIGHT HIP (WITH PELVIS) 2-3 VIEWS  COMPARISON:  05/13/2010  FINDINGS: Patient has had previous right hip arthroplasty. There is no evidence for loosening or subluxation. There is dense atherosclerotic calcification of the femoral arteries. Degenerative changes are noted in the lower lumbar spine. No acute fracture.  IMPRESSION: No evidence for acute  abnormality.   Electronically Signed   By: Nolon Nations M.D.   On: 05/04/2014  16:27    EKG: Independently reviewed. Atrial fibrillation controlled rate.  Assessment/Plan Principal Problem:   Sepsis Active Problems:   Psoas abscess   New onset atrial fibrillation   Diabetes mellitus type 2, controlled   Essential hypertension   Gout   1. Sepsis from possible iliopsoas abscess on the right side - I have discussed with Dr. Lucia Gaskins on-call surgeon. At this time patient has been placed on empiric antibiotics vancomycin and Zosyn. Surgery is planning to have percutaneous drain to be done by IR. Follow cultures. Check pro-calcitonin level and continue hydration. Patient is kept nothing by mouth in anticipation of the procedure. 2. New onset atrial fibrillation - presently rate controlled. Patient is on IV metoprolol scheduled doses patient is nothing by mouth. Patient's chads 2 vasc score is more than 2 and eventually patient will need heparin once the procedure is done and patient is clinically stable. 3. Hypertension - patient is on scheduled dose of IV metoprolol since patient is nothing by mouth. 4. Diabetes mellitus type 2 - presently patient is on sliding scale coverage. 5. Alcoholism - patient is placed on alcohol withdrawal protocol. 6. History of gout on the [place].   DVT Prophylaxis SCDs in anticipation of procedure.  Code Status: Full code.  Family Communication: Discussed with patient.  Disposition Plan: Admit to inpatient.    Jacquita Mulhearn N. [facility] Pager [phone].  If 7PM-7AM, please contact night-coverage [email or web address] Password [facility] 05/04/2014, 11:58 PM

## 2014-05-04 NOTE — Progress Notes (Signed)
EDCM spoke to patient at bedside.  Patient reports he lives at home with his daughter and son in [employer] in [place].  Patient reports he needs assistance with his ADL's at times.  Patient does not have home health services currently and never has.  Patient has a walker and a cane at home.  Patient denies further dme needs at this time.  EDCM provided patient with list of home health agencies in [facility], explained services.  Patient reports he may be interested in home health services.  EDCM informed patient that home health agency of his choice will have 24-48 hours to contact him.  Patient thankful for resources.  Awaiting disposition.  No further EDCM needs at this time.

## 2014-05-04 NOTE — ED Notes (Signed)
Bed: WA20 Expected date:  Expected time:  Means of arrival:  Comments: EMS 

## 2014-05-04 NOTE — ED Notes (Signed)
Pt in ct radiology

## 2014-05-04 NOTE — Progress Notes (Signed)
Whiles getting report it was realized that pt was in A-fib, Pt got on the unit not too long ago, Dr. Hal Hope  was made aware of the A-fib, who came to see the patient on the unit. Report has be given to the on-coming rn.----- [name] ,rn

## 2014-05-04 NOTE — ED Provider Notes (Signed)
Patient with fever diminished urine output for about the past one week. His daughter reports she's also been more confused over the past week. Urinating small amounts. He also complains of worsening right hip pain for the past one week. No trauma. Hip pain is worse with moving his hip improved with remaining still. No treatment prior to coming here. Last bowel movement he reports his one week ago. On exam alert Glasgow Coma Score 15 HEENT exam extremities dry and icteric neck supple lungs clear auscultation abdomen nondistended nontender genitalia normal male. All 4 extremity is without redness swelling or tenderness. He has exquisite pain at right hip on active or passive flexion normal tone brown stool Hemoccult negative.  Orlie Dakin, MD 05/05/14 [phone]

## 2014-05-04 NOTE — ED Notes (Signed)
ICU reports that they are unable to take the patient at this time because they do not have a nurse to take the patient. Will call back with update.

## 2014-05-04 NOTE — ED Notes (Signed)
Jarrett Soho PA made aware of CG4 lactic result.

## 2014-05-04 NOTE — ED Provider Notes (Signed)
CSN: [id number]     Arrival date & time 05/04/14  1429 History   First MD Initiated Contact with Patient 05/04/14 1504     Chief Complaint  Patient presents with  . Fatigue  . Fever     (Consider location/radiation/quality/duration/timing/severity/associated sxs/prior Treatment) The history is provided by the patient and medical records. No language interpreter was used.     [name] is a 79 y.o. male  with a hx of HTN, arthritis presents to the Emergency Department complaining of gradual, persistent, progressively worsening fatigue with associated fever onset 2 days ago. Pt is a poor historian but reports that he awoke with R hip pain sometime last week (he is unable to tell me the day) and has required assistance getting out of bed since that time.  He reports he has been unable to get out of bed today due to the pain.  He reports he takes 100mg  of Tramadol QID for arthritis pain and reports his last dose was early this morning.  He is requesting his mid-day dose.  He reports that he had a fever on the same day that the pain started but this has not come back.  He does endorse feeling hot some in the last week.  He also reports RLQ abd pain present for the last 6 months that has been evaluated by his PCP with an abd [place] x2.  He reports dark and foul smelling urine for the last several days.  He reports vomiting x1 after taking his medications on an empty stomach 4 days ago, but has not no abd pain, diarrhea, nausea or further episodes of this. Pt reports he lives with his daughter who is to be coming. Pt reports the tramadol does help his pain and movement and ambulation makes it worse.  He reports bilateral knee replacements, shoulder surgeries and right total hip arthroplasty.  He also has associated right leg pain.    Past Medical History  Diagnosis Date  . Hypertension   . Arthritis    Past Surgical History  Procedure Laterality Date  . Hip arthroplasty Right   . Joint replacement       right hip, bilateral knees  . Right cataract extraction     History reviewed. No pertinent family history. History  Substance Use Topics  . Smoking status: Former Smoker -- 25.00 packs/day    Types: Cigarettes  . Smokeless tobacco: Never Used  . Alcohol Use: 0.0 oz/week    0 Standard drinks or equivalent per week     Comment: 3 glasses of vodka/day    Review of Systems  Constitutional: Positive for fever, appetite change and fatigue. Negative for diaphoresis and unexpected weight change.  HENT: Negative for mouth sores.   Eyes: Negative for visual disturbance.  Respiratory: Negative for cough, chest tightness, shortness of breath and wheezing.   Cardiovascular: Negative for chest pain.  Gastrointestinal: Positive for abdominal pain ( chronic). Negative for nausea, vomiting, diarrhea and constipation.  Endocrine: Negative for polydipsia, polyphagia and polyuria.  Genitourinary: Negative for dysuria, urgency, frequency and hematuria.  Musculoskeletal: Positive for arthralgias (right hip). Negative for back pain and neck stiffness.  Skin: Negative for rash.  Allergic/Immunologic: Negative for immunocompromised state.  Neurological: Negative for syncope, light-headedness and headaches.  Hematological: Does not bruise/bleed easily.  Psychiatric/Behavioral: Negative for sleep disturbance. The patient is not nervous/anxious.       Allergies  Allopurinol and Motrin  Home Medications   Prior to Admission medications   Medication  Sig Start Date End Date Taking? Authorizing Provider  atenolol (TENORMIN) 50 MG tablet Take 50 mg by mouth daily. 04/16/14  Yes Historical Provider, MD  celecoxib (CELEBREX) 200 MG capsule Take 200 mg by mouth 2 (two) times daily. 02/26/14  Yes Historical Provider, MD  CRESTOR 10 MG tablet Take 10 mg by mouth daily. Takes one every other day 03/05/14  Yes Historical Provider, MD  furosemide (LASIX) 20 MG tablet Take 20 mg by mouth daily. 02/21/14  Yes  Historical Provider, MD  LYRICA 75 MG capsule Take 75 mg by mouth 2 (two) times daily. 75mg  in the morning and 150mg  at night 04/27/14  Yes Historical Provider, MD  metFORMIN (GLUCOPHAGE) 500 MG tablet Take 1,000 mg by mouth. 02/24/14  Yes Historical Provider, MD  traMADol (ULTRAM) 50 MG tablet Take 100 mg by mouth. Every 4 hours 04/10/14  Yes Historical Provider, MD  ULORIC 80 MG TABS Take 1 tablet by mouth daily. 02/22/14  Yes Historical Provider, MD   BP 116/59 mmHg  Pulse 66  Temp(Src) 101.5 F (38.6 C) (Rectal)  Resp 20  Wt 225 lb (102.059 kg)  SpO2 95% Physical Exam  Constitutional: He is oriented to person, place, and time. He appears well-developed and well-nourished. No distress.  Awake, alert, nontoxic appearance  HENT:  Head: Normocephalic and atraumatic.  Right Ear: Tympanic membrane, external ear and ear canal normal.  Left Ear: Tympanic membrane, external ear and ear canal normal.  Nose: Nose normal. No epistaxis. Right sinus exhibits no maxillary sinus tenderness and no frontal sinus tenderness. Left sinus exhibits no maxillary sinus tenderness and no frontal sinus tenderness.  Mouth/Throat: Uvula is midline, oropharynx is clear and moist and mucous membranes are normal. Mucous membranes are not pale and not cyanotic. No oropharyngeal exudate, posterior oropharyngeal edema, posterior oropharyngeal erythema or tonsillar abscesses.  Eyes: Conjunctivae are normal. Pupils are equal, round, and reactive to light. No scleral icterus.  Neck: Normal range of motion and full passive range of motion without pain. Neck supple.  Cardiovascular: Normal rate, regular rhythm, normal heart sounds and intact distal pulses.   No tachycardia  Pulmonary/Chest: Effort normal and breath sounds normal. No stridor. No respiratory distress. He has no wheezes.  Equal chest expansion  Abdominal: Soft. Bowel sounds are normal. He exhibits no distension and no mass. There is no tenderness. There is no  rebound and no guarding.  Soft and nontender  Musculoskeletal: Normal range of motion. He exhibits tenderness. He exhibits no edema.  No peripheral edema Minimal ROM of the right hip due to pain; no erythema, increased warmth or induration; TTP of the greater trochanter  Lymphadenopathy:    He has no cervical adenopathy.  Neurological: He is alert and oriented to person, place, and time.  Speech is clear and goal oriented Moves extremities without ataxia  Skin: Skin is warm and dry. No rash noted. He is not diaphoretic.  Chronic skin changes of the lower legs without erythema, increased warmth, swelling or induration  Psychiatric: He has a normal mood and affect.  Nursing note and vitals reviewed.   ED Course  Procedures (including critical care time) Labs Review Labs Reviewed  CBC WITH DIFFERENTIAL/PLATELET - Abnormal; Notable for the following:    RBC 3.96 (*)    HCT 37.7 (*)    Neutrophils Relative % 78 (*)    Neutro Abs 8.0 (*)    Lymphocytes Relative 9 (*)    Monocytes Relative 13 (*)    Monocytes Absolute 1.4 (*)  All other components within normal limits  COMPREHENSIVE METABOLIC PANEL - Abnormal; Notable for the following:    Sodium 133 (*)    Chloride 94 (*)    Glucose, Bld 146 (*)    BUN 38 (*)    AST 152 (*)    ALT 92 (*)    Total Bilirubin 1.6 (*)    GFR calc non Af Amer 85 (*)    Anion gap 17 (*)    All other components within normal limits  URINALYSIS, ROUTINE W REFLEX MICROSCOPIC - Abnormal; Notable for the following:    Hgb urine dipstick SMALL (*)    Urobilinogen, UA 2.0 (*)    All other components within normal limits  I-STAT CG4 LACTIC ACID, ED - Abnormal; Notable for the following:    Lactic Acid, Venous 2.38 (*)    All other components within normal limits  CULTURE, BLOOD (ROUTINE X 2)  CULTURE, BLOOD (ROUTINE X 2)  URINE CULTURE  URINE MICROSCOPIC-ADD ON  CBG MONITORING, ED  POC OCCULT BLOOD, ED  I-STAT CG4 LACTIC ACID, ED    Imaging  Review Ct Pelvis W Contrast  05/04/2014   CLINICAL DATA:  Right hip pain and fever for 3 days. History of total hip replacement 2009.  EXAM: CT PELVIS WITH CONTRAST  TECHNIQUE: Multidetector CT imaging of the pelvis was performed using the standard protocol following the bolus administration of intravenous contrast.  CONTRAST:  71mL OMNIPAQUE IOHEXOL 300 MG/ML  SOLN  COMPARISON:  05/04/2014 and MR 02/26/2010  FINDINGS: There is soft tissue density fullness with faint rim enhancement along the lower aspect of the right iliopsoas muscle. This measures 5.2 x 3.5 x 8.5 cm. Centrally this measures soft tissue density. Considerations include abscess, phlegmon, myositis, or other soft tissue mass. Further evaluation with MRI may be helpful. Patient has had previous right total hip arthroplasty. Prosthetic components appear well seated. There is no evidence for periprosthetic loosening. Degenerative changes are seen in the lower lumbar spine. There is dense atherosclerosis of the abdominal aorta. There is significant sigmoid diverticulosis.  IMPRESSION: 1. Rim enhancing mass along the lower aspect of the iliopsoas muscle, suspicious for abscess or phlegmon. Myositis or other soft tissue mass is also a consideration. Further evaluation with MRI may be helpful. 2. Status post right total hip arthroplasty without adverse features. 3. Significant sigmoid diverticulosis.   Electronically Signed   By: Nolon Nations M.D.   On: 05/04/2014 20:39   Dg Chest Port 1 View  05/04/2014   CLINICAL DATA:  Fever.  Fatigue.  Altered mental status.  EXAM: PORTABLE CHEST - 1 VIEW  COMPARISON:  05/13/2010  FINDINGS: Heart size is within normal limits and stable allowing for portable technique. Atherosclerotic calcification of thoracic aorta again noted. Both lungs are clear. No evidence of pneumothorax or pleural effusion. Bilateral shoulder prostheses again noted.  IMPRESSION: Stable exam.  No active disease.   Electronically Signed    By: Earle Gell M.D.   On: 05/04/2014 16:25   Dg Knee Complete 4 Views Right  05/04/2014   CLINICAL DATA:  Fatigue and fever. Right lower extremity pain. Altered mental status.  EXAM: RIGHT KNEE - COMPLETE 4+ VIEW  COMPARISON:  None.  FINDINGS: Total knee prosthesis observed. No fracture or complicating feature identified. Polymer spacer remains in place.  Distal SFA and popliteal artery atherosclerotic calcification.  IMPRESSION: 1. Total knee prosthesis without complicating feature identified. 2. Atherosclerosis.   Electronically Signed   By: Cindra Eves.D.  On: 05/04/2014 16:28   Dg Hip Unilat With Pelvis 2-3 Views Right  05/04/2014   CLINICAL DATA:  Right lateral hip pain. History of arthroplasty. Altered mental status. Denies fall. Fatigue.  EXAM: RIGHT HIP (WITH PELVIS) 2-3 VIEWS  COMPARISON:  05/13/2010  FINDINGS: Patient has had previous right hip arthroplasty. There is no evidence for loosening or subluxation. There is dense atherosclerotic calcification of the femoral arteries. Degenerative changes are noted in the lower lumbar spine. No acute fracture.  IMPRESSION: No evidence for acute  abnormality.   Electronically Signed   By: Nolon Nations M.D.   On: 05/04/2014 16:27     EKG Interpretation None      MDM   Final diagnoses:  Right hip pain  Fever, unspecified fever cause  Abscess of male pelvis   [name] presents with fever, fatigue, right hip pain and decreased mobility for several days to 1 week.  Pt febrile here in the department, but without tachypnea, tachycardia or hypotension.  Pt's lack of tachycardia may be 2/2 to his daily atenolol usage.  Will monitor carefully, give judicious fluid bolus initially and begin SIRS/Sepsis work-up.  Pt reports he has not urinated today.  Will check bladder scan.  He is unable to sit up in bed even with assistance due to pain in the hip.  He is requesting Tramadol as this is what he takes at home.  Will give this in  addition to tylenol and reassess.  Pt c/o chronic abd pain, present today however on exam, pt with soft and nontender abd.    4:40 PM Pt with elevated lactic > 2.  Weight based fluid bolus and antibiotics ordered.  Pt has still not urinated; will begin with broad spectrum.  Discussed situation with daughter who is now at bedside.  She reports confusion in the patient today as well as increasing weakness and right hip pain for the last 2 weeks.  She reports he is unable to stand unassisted for the last 2 days.  Pt reports that prior to this week pt accomplished all of his ADLs independently.    6:58PM Lactic acid improving. No evidence of urinary tract infection.  CBC without leukocytosis.  Hip x-ray and chest x-ray without evidence of acute or normality. Will obtain CT scan of the abdomen as he has right lower quadrant abdominal pain.  8:45PM CT scan with rim-enhancing mass along the lower aspect of the iliopsoas muscle suspicious for abscess. This would fit with patient elevated lactic acid and fever. Will consult surgery.  9:40 PM Discussed with Dr. Lucia Gaskins of [facility] [place] [facility] who recommends medical admission and he will evaluate tonight.  Discussed with Dr. Hal Hope who will admit to stepdown.  BP 116/59 mmHg  Pulse 66  Temp(Src) 101.5 F (38.6 C) (Rectal)  Resp 20  Wt 225 lb (102.059 kg)  SpO2 95%  The patient was discussed with and seen by Dr. Winfred Leeds who agrees with the treatment plan.   Jarrett Soho Tyreesha Maharaj, PA-C 05/04/14 2141  Orlie Dakin, MD 05/05/14 [phone]

## 2014-05-04 NOTE — ED Notes (Signed)
[facility] related to fatigue, and dark urine for 2 days. Pt states normally able to care for self and has not been able to get out of bed today. Pt alert x4 MAE randomly

## 2014-05-05 ENCOUNTER — Encounter (HOSPITAL_COMMUNITY): Payer: Self-pay | Admitting: Radiology

## 2014-05-05 DIAGNOSIS — K6812 Psoas muscle abscess: Secondary | ICD-10-CM | POA: Diagnosis present

## 2014-05-05 DIAGNOSIS — F1020 Alcohol dependence, uncomplicated: Secondary | ICD-10-CM

## 2014-05-05 DIAGNOSIS — R509 Fever, unspecified: Secondary | ICD-10-CM

## 2014-05-05 DIAGNOSIS — I4891 Unspecified atrial fibrillation: Secondary | ICD-10-CM | POA: Insufficient documentation

## 2014-05-05 DIAGNOSIS — E119 Type 2 diabetes mellitus without complications: Secondary | ICD-10-CM | POA: Insufficient documentation

## 2014-05-05 DIAGNOSIS — I10 Essential (primary) hypertension: Secondary | ICD-10-CM

## 2014-05-05 LAB — CBC WITH DIFFERENTIAL/PLATELET
Basophils Absolute: 0 10*3/uL (ref 0.0–0.1)
Basophils Relative: 0 % (ref 0–1)
Eosinophils Absolute: 0.1 10*3/uL (ref 0.0–0.7)
Eosinophils Relative: 1 % (ref 0–5)
HEMATOCRIT: 39 % (ref 39.0–52.0)
HEMOGLOBIN: 13.6 g/dL (ref 13.0–17.0)
Lymphocytes Relative: 11 % — ABNORMAL LOW (ref 12–46)
Lymphs Abs: 1.1 10*3/uL (ref 0.7–4.0)
MCH: 33.6 pg (ref 26.0–34.0)
MCHC: 34.9 g/dL (ref 30.0–36.0)
MCV: 96.3 fL (ref 78.0–100.0)
MONO ABS: 1.3 10*3/uL — AB (ref 0.1–1.0)
Monocytes Relative: 14 % — ABNORMAL HIGH (ref 3–12)
Neutro Abs: 7.2 10*3/uL (ref 1.7–7.7)
Neutrophils Relative %: 74 % (ref 43–77)
Platelets: 206 10*3/uL (ref 150–400)
RBC: 4.05 MIL/uL — AB (ref 4.22–5.81)
RDW: 13.4 % (ref 11.5–15.5)
WBC: 9.8 10*3/uL (ref 4.0–10.5)

## 2014-05-05 LAB — COMPREHENSIVE METABOLIC PANEL
ALBUMIN: 3.5 g/dL (ref 3.5–5.2)
ALT: 111 U/L — ABNORMAL HIGH (ref 0–53)
ANION GAP: 11 (ref 5–15)
AST: 167 U/L — AB (ref 0–37)
Alkaline Phosphatase: 120 U/L — ABNORMAL HIGH (ref 39–117)
BILIRUBIN TOTAL: 1.8 mg/dL — AB (ref 0.3–1.2)
BUN: 28 mg/dL — AB (ref 6–23)
CALCIUM: 8.4 mg/dL (ref 8.4–10.5)
CO2: 24 mmol/L (ref 19–32)
Chloride: 99 mmol/L (ref 96–112)
Creatinine, Ser: 0.83 mg/dL (ref 0.50–1.35)
GFR calc Af Amer: >90 mL/min (ref >90)
GFR, EST NON AFRICAN AMERICAN: 83 mL/min — AB (ref >90)
Glucose, Bld: 126 mg/dL — ABNORMAL HIGH (ref 70–99)
Potassium: 3.6 mmol/L (ref 3.5–5.1)
Sodium: 134 mmol/L — ABNORMAL LOW (ref 135–145)
Total Protein: 7.4 g/dL (ref 6.0–8.3)

## 2014-05-05 LAB — PROCALCITONIN: Procalcitonin: 0.97 ng/mL

## 2014-05-05 LAB — URINE CULTURE

## 2014-05-05 LAB — GLUCOSE, CAPILLARY
GLUCOSE-CAPILLARY: 118 mg/dL — AB (ref 70–99)
GLUCOSE-CAPILLARY: 132 mg/dL — AB (ref 70–99)
Glucose-Capillary: 124 mg/dL — ABNORMAL HIGH (ref 70–99)
Glucose-Capillary: 151 mg/dL — ABNORMAL HIGH (ref 70–99)
Glucose-Capillary: 148 mg/dL — ABNORMAL HIGH (ref 70–99)
Glucose-Capillary: 154 mg/dL — ABNORMAL HIGH (ref 70–99)

## 2014-05-05 LAB — PROTIME-INR
INR: 1.18 (ref 0.00–1.49)
Prothrombin Time: 15.2 seconds (ref 11.6–15.2)

## 2014-05-05 LAB — LACTIC ACID, PLASMA: Lactic Acid, Venous: 1.6 mmol/L (ref 0.5–2.0)

## 2014-05-05 LAB — MRSA PCR SCREENING: MRSA by PCR: NEGATIVE

## 2014-05-05 LAB — APTT: aPTT: 32 seconds (ref 24–37)

## 2014-05-05 LAB — TSH: TSH: 1.523 u[IU]/mL (ref 0.350–4.500)

## 2014-05-05 MED ORDER — METOPROLOL TARTRATE 1 MG/ML IV SOLN
5.0000 mg | Freq: Once | INTRAVENOUS | Status: AC
Start: 2014-05-05 — End: 2014-05-05
  Administered 2014-05-05: 5 mg via INTRAVENOUS

## 2014-05-05 MED ORDER — METOPROLOL TARTRATE 1 MG/ML IV SOLN
INTRAVENOUS | Status: AC
  Filled 2014-05-05: qty 5

## 2014-05-05 MED ORDER — DILTIAZEM HCL 25 MG/5ML IV SOLN: 10.0000 mg | Freq: Once | INTRAVENOUS | Status: DC

## 2014-05-05 MED ORDER — DEXTROSE 5 % IV SOLN
5.0000 mg/h | INTRAVENOUS | Status: DC
  Administered 2014-05-05: 12.5 mg/h via INTRAVENOUS
  Administered 2014-05-05 – 2014-05-06 (×2): 5 mg/h via INTRAVENOUS
  Administered 2014-05-06: 10 mg/h via INTRAVENOUS
  Administered 2014-05-07 – 2014-05-08 (×3): 5 mg/h via INTRAVENOUS
  Administered 2014-05-09: 7.5 mg/h via INTRAVENOUS
  Administered 2014-05-10: 15 mg/h via INTRAVENOUS
  Administered 2014-05-10: 7.5 mg/h via INTRAVENOUS
  Administered 2014-05-11 (×3): 15 mg/h via INTRAVENOUS
  Administered 2014-05-12: 10 mg/h via INTRAVENOUS
  Administered 2014-05-12: 15 mg/h via INTRAVENOUS
  Administered 2014-05-13 – 2014-05-14 (×4): 10 mg/h via INTRAVENOUS
  Administered 2014-05-15: 5 mg/h via INTRAVENOUS
  Filled 2014-05-05 (×22): qty 100

## 2014-05-05 MED ORDER — HYDRALAZINE HCL 20 MG/ML IJ SOLN
INTRAMUSCULAR | Status: AC
  Administered 2014-05-05: 10 mg via INTRAVENOUS
  Filled 2014-05-05: qty 1

## 2014-05-05 MED ORDER — DILTIAZEM LOAD VIA INFUSION
10.0000 mg | Freq: Once | INTRAVENOUS | Status: AC
  Administered 2014-05-05: 10 mg via INTRAVENOUS
  Filled 2014-05-05: qty 10

## 2014-05-05 MED ORDER — HYDRALAZINE HCL 20 MG/ML IJ SOLN
10.0000 mg | Freq: Four times a day (QID) | INTRAMUSCULAR | Status: DC | PRN
  Administered 2014-05-05 – 2014-05-06 (×4): 10 mg via INTRAVENOUS
  Filled 2014-05-05 (×4): qty 1

## 2014-05-05 MED ORDER — CETYLPYRIDINIUM CHLORIDE 0.05 % MT LIQD
7.0000 mL | Freq: Two times a day (BID) | OROMUCOSAL | Status: DC
  Administered 2014-05-05 – 2014-05-23 (×30): 7 mL via OROMUCOSAL

## 2014-05-05 NOTE — H&P (Signed)
Chief Complaint: Chief Complaint  Patient presents with  . Fatigue  . Fever   Referring Physician(s): [facility]  History of Present Illness: [name] is a 79 y.o. male who is confused today and history is obtained per chart review. I am unable to reach family today as the number listed has been disconnected. The patient presented to ED with c/o right hip and pelvic pain with fevers. CT revealed right iliopsoas fluid collection concerning for possible abscess. IR received request for image guided aspiration.   Past Medical History  Diagnosis Date  . Hypertension   . Arthritis     Past Surgical History  Procedure Laterality Date  . Hip arthroplasty Right   . Joint replacement      right hip, bilateral knees  . Right cataract extraction      Allergies: Allopurinol and Motrin  Medications: Prior to Admission medications   Medication Sig Start Date End Date Taking? Authorizing Provider  atenolol (TENORMIN) 50 MG tablet Take 50 mg by mouth daily. 04/16/14  Yes Historical Provider, MD  celecoxib (CELEBREX) 200 MG capsule Take 200 mg by mouth 2 (two) times daily. 02/26/14  Yes Historical Provider, MD  CRESTOR 10 MG tablet Take 10 mg by mouth daily. Takes one every other day 03/05/14  Yes Historical Provider, MD  furosemide (LASIX) 20 MG tablet Take 20 mg by mouth daily. 02/21/14  Yes Historical Provider, MD  LYRICA 75 MG capsule Take 75 mg by mouth 2 (two) times daily. 75mg  in the morning and 150mg  at night 04/27/14  Yes Historical Provider, MD  metFORMIN (GLUCOPHAGE) 500 MG tablet Take 1,000 mg by mouth. 02/24/14  Yes Historical Provider, MD  traMADol (ULTRAM) 50 MG tablet Take 100 mg by mouth. Every 4 hours 04/10/14  Yes Historical Provider, MD  ULORIC 80 MG TABS Take 1 tablet by mouth daily. 02/22/14  Yes Historical Provider, MD     Family History  Problem Relation Age of Onset  . Diabetes Mellitus II Daughter     History   Social History  . Marital Status: Divorced   Spouse Name: N/A  . Number of Children: N/A  . Years of Education: N/A   Social History Main Topics  . Smoking status: Former Smoker -- 25.00 packs/day    Types: Cigarettes  . Smokeless tobacco: Never Used  . Alcohol Use: 0.0 oz/week    0 Standard drinks or equivalent per week     Comment: 3 glasses of vodka/day  . Drug Use: No  . Sexual Activity: No   Other Topics Concern  . None   Social History Narrative   Review of Systems: A 12 point ROS discussed and pertinent positives are indicated in the HPI above.  All other systems are negative.  Review of Systems  Vital Signs: BP 179/99 mmHg  Pulse 100  Temp(Src) 99.1 F (37.3 C) (Oral)  Resp 23  Ht 5\' 7"  (1.702 m)  Wt 223 lb 5.2 oz (101.3 kg)  BMI 34.97 kg/m2  SpO2 95%  Physical Exam General: Alert, confused, NAD Heart: Tachycardic Lungs: CTA b/l Abd: Soft, NT  Mallampati Score:  MD Evaluation Airway: WNL Heart: WNL Abdomen: WNL Chest/ Lungs: WNL ASA  Classification: 2 Mallampati/Airway Score: Two  Imaging: Ct Pelvis W Contrast  05/04/2014   CLINICAL DATA:  Right hip pain and fever for 3 days. History of total hip replacement 2009.  EXAM: CT PELVIS WITH CONTRAST  TECHNIQUE: Multidetector CT imaging of the pelvis was performed  using the standard protocol following the bolus administration of intravenous contrast.  CONTRAST:  65mL OMNIPAQUE IOHEXOL 300 MG/ML  SOLN  COMPARISON:  05/04/2014 and MR 02/26/2010  FINDINGS: There is soft tissue density fullness with faint rim enhancement along the lower aspect of the right iliopsoas muscle. This measures 5.2 x 3.5 x 8.5 cm. Centrally this measures soft tissue density. Considerations include abscess, phlegmon, myositis, or other soft tissue mass. Further evaluation with MRI may be helpful. Patient has had previous right total hip arthroplasty. Prosthetic components appear well seated. There is no evidence for periprosthetic loosening. Degenerative changes are seen in the lower  lumbar spine. There is dense atherosclerosis of the abdominal aorta. There is significant sigmoid diverticulosis.  IMPRESSION: 1. Rim enhancing mass along the lower aspect of the iliopsoas muscle, suspicious for abscess or phlegmon. Myositis or other soft tissue mass is also a consideration. Further evaluation with MRI may be helpful. 2. Status post right total hip arthroplasty without adverse features. 3. Significant sigmoid diverticulosis.   Electronically Signed   By: Nolon Nations M.D.   On: 05/04/2014 20:39   Dg Chest Port 1 View  05/04/2014   CLINICAL DATA:  Fever.  Fatigue.  Altered mental status.  EXAM: PORTABLE CHEST - 1 VIEW  COMPARISON:  05/13/2010  FINDINGS: Heart size is within normal limits and stable allowing for portable technique. Atherosclerotic calcification of thoracic aorta again noted. Both lungs are clear. No evidence of pneumothorax or pleural effusion. Bilateral shoulder prostheses again noted.  IMPRESSION: Stable exam.  No active disease.   Electronically Signed   By: Earle Gell M.D.   On: 05/04/2014 16:25   Dg Knee Complete 4 Views Right  05/04/2014   CLINICAL DATA:  Fatigue and fever. Right lower extremity pain. Altered mental status.  EXAM: RIGHT KNEE - COMPLETE 4+ VIEW  COMPARISON:  None.  FINDINGS: Total knee prosthesis observed. No fracture or complicating feature identified. Polymer spacer remains in place.  Distal SFA and popliteal artery atherosclerotic calcification.  IMPRESSION: 1. Total knee prosthesis without complicating feature identified. 2. Atherosclerosis.   Electronically Signed   By: Van Clines M.D.   On: 05/04/2014 16:28   Dg Hip Unilat With Pelvis 2-3 Views Right  05/04/2014   CLINICAL DATA:  Right lateral hip pain. History of arthroplasty. Altered mental status. Denies fall. Fatigue.  EXAM: RIGHT HIP (WITH PELVIS) 2-3 VIEWS  COMPARISON:  05/13/2010  FINDINGS: Patient has had previous right hip arthroplasty. There is no evidence for loosening or  subluxation. There is dense atherosclerotic calcification of the femoral arteries. Degenerative changes are noted in the lower lumbar spine. No acute fracture.  IMPRESSION: No evidence for acute  abnormality.   Electronically Signed   By: Nolon Nations M.D.   On: 05/04/2014 16:27    Labs:  CBC:  Recent Labs  05/04/14 1500 05/05/14 0500  WBC 10.3 9.8  HGB 13.0 13.6  HCT 37.7* 39.0  PLT 180 206    COAGS:  Recent Labs  05/05/14 0055  INR 1.18  APTT 32    BMP:  Recent Labs  05/04/14 1500 05/05/14 0500  NA 133* 134*  K 3.6 3.6  CL 94* 99  CO2 22 24  GLUCOSE 146* 126*  BUN 38* 28*  CALCIUM 8.9 8.4  CREATININE 0.78 0.83  GFRNONAA 85* 83*  GFRAA >90 >90    LIVER FUNCTION TESTS:  Recent Labs  05/04/14 1500 05/05/14 0500  BILITOT 1.6* 1.8*  AST 152* 167*  ALT 92*  111*  ALKPHOS 111 120*  PROT 7.4 7.4  ALBUMIN 3.6 3.5   Assessment and Plan: Right hip and pelvic pain with fevers CT revealed right iliopsoas fluid collection concerning for possible abscess Request for image guided aspiration Images reviewed with Dr. Vernard Gambles and area is amendable to aspiration The patient has been NPO, no blood thinners taken, labs and vitals have been reviewed. Patient is confused and I am unable to get ahold of family for consent, D/w Dr. Excell Seltzer at this time no need for emergent consent, will wait to see if family can be reached.    Thank you for this interesting consult.  I greatly enjoyed meeting [name] and look forward to participating in their care.  SignedHedy Jacob 05/05/2014, 10:45 AM   I spent a total of 20 Minutes in face to face in clinical consultation, greater than 50% of which was counseling/coordinating care for right iliopsoas fluid collection.

## 2014-05-05 NOTE — Progress Notes (Signed)
Son in law, Carlisle Cater called to inform that patient is highly allergic to most deterngents. "Reaction is like a bee sting, Skin gets very red".

## 2014-05-05 NOTE — Consult Note (Signed)
CARDIOLOGY CONSULT NOTE  Patient ID: REEDER BRISBY MRN: [id number] DOB/AGE: Dec 08, 1935 79 y.o.  Admit date: 05/04/2014 Primary Physician Lujean Amel, MD  Reason for Consultation: new-onset atrial fibrillation  HPI: The patient is a 79 yr old male with a PMH significant for HTN, type 2 diabetes, prior h/o tobacco abuse, chronic alcoholism (drinks 3 glasses of vodka daily), and gout who was admitted on 4/29 with right hip and right lower quadrant pain and was found to have an iliopsoas abscess, with plans for aspiration and drainage by interventional radiology once consent can be obtained. ECG demonstrated atrial fibrillation, HR 95 bpm, which is a new diagnosis for him and is the reason why I was consulted.  Became tachycardic (HR 120-140) and IV diltiazem 5 mg/hr was started 10 minutes ago, with HR now 95-102 bpm.  BP 179/99. Currently on IV fluids and broad-spectrum antimicrobial therapy.  Denies any chest pain, shortness of breath, palpitations, and prior history of MI. Spoke with nurse, Hoyle Sauer, who says patient has been confused and family is trying to be reached for consent but phone is disconnected.     Allergies  Allergen Reactions  . Allopurinol Other (See Comments)    Makes head 'feel funny'  . Motrin [Ibuprofen] Other (See Comments)    Blisters in groin area    Current Facility-Administered Medications  Medication Dose Route Frequency Provider Last Rate Last Dose  . 0.9 %  sodium chloride infusion   Intravenous Continuous Rise Patience, MD 100 mL/hr at 05/05/14 0208    . acetaminophen (TYLENOL) tablet 650 mg  650 mg Oral Q6H PRN Rise Patience, MD       Or  . acetaminophen (TYLENOL) suppository 650 mg  650 mg Rectal Q6H PRN Rise Patience, MD      . diltiazem (CARDIZEM) 100 mg in dextrose 5 % 100 mL (1 mg/mL) infusion  5-15 mg/hr Intravenous Titrated Verlee Monte, MD 5 mL/hr at 05/05/14 1049 5 mg/hr at 05/05/14 1049  . febuxostat (ULORIC) tablet  80 mg  80 mg Oral Daily Rise Patience, MD      . folic acid (FOLVITE) tablet 1 mg  1 mg Oral Daily Rise Patience, MD   1 mg at 05/05/14 8416  . insulin aspart (novoLOG) injection 0-9 Units  0-9 Units Subcutaneous Q4H Rise Patience, MD   2 Units at 05/05/14 [phone]  . LORazepam (ATIVAN) injection 0-4 mg  0-4 mg Intravenous Q6H Rise Patience, MD   1 mg at 05/05/14 0153   Followed by  . [START ON 05/07/2014] LORazepam (ATIVAN) injection 0-4 mg  0-4 mg Intravenous Q12H Rise Patience, MD      . LORazepam (ATIVAN) tablet 1 mg  1 mg Oral Q6H PRN Rise Patience, MD       Or  . LORazepam (ATIVAN) injection 1 mg  1 mg Intravenous Q6H PRN Rise Patience, MD   1 mg at 05/05/14 0160  . morphine 2 MG/ML injection 1 mg  1 mg Intravenous Q4H PRN Rise Patience, MD      . multivitamin with minerals tablet 1 tablet  1 tablet Oral Daily Rise Patience, MD   1 tablet at 05/05/14 [phone]  . ondansetron (ZOFRAN) tablet 4 mg  4 mg Oral Q6H PRN Rise Patience, MD       Or  . ondansetron [facility]) injection 4 mg  4 mg Intravenous Q6H PRN Rise Patience, MD      .  piperacillin-tazobactam (ZOSYN) IVPB 3.375 g  3.375 g Intravenous Q8H Anh P Pham, RPH   3.375 g at 05/05/14 0639  . thiamine (VITAMIN B-1) tablet 100 mg  100 mg Oral Daily Rise Patience, MD   100 mg at 05/05/14 6160   Or  . thiamine (B-1) injection 100 mg  100 mg Intravenous Daily Rise Patience, MD      . vancomycin (VANCOCIN) IVPB 750 mg/150 ml premix  750 mg Intravenous Q12H Anh P Pham, RPH   750 mg at 05/05/14 0430    Past Medical History  Diagnosis Date  . Hypertension   . Arthritis     Past Surgical History  Procedure Laterality Date  . Hip arthroplasty Right   . Joint replacement      right hip, bilateral knees  . Right cataract extraction      History   Social History  . Marital Status: Divorced    Spouse Name: N/A  . Number of Children: N/A  . Years of Education: N/A    Occupational History  . Not on file.   Social History Main Topics  . Smoking status: Former Smoker -- 25.00 packs/day    Types: Cigarettes  . Smokeless tobacco: Never Used  . Alcohol Use: 0.0 oz/week    0 Standard drinks or equivalent per week     Comment: 3 glasses of vodka/day  . Drug Use: No  . Sexual Activity: No   Other Topics Concern  . Not on file   Social History Narrative     No family history of premature CAD in 1st degree relatives.  Prior to Admission medications   Medication Sig Start Date End Date Taking? Authorizing Provider  atenolol (TENORMIN) 50 MG tablet Take 50 mg by mouth daily. 04/16/14  Yes Historical Provider, MD  celecoxib (CELEBREX) 200 MG capsule Take 200 mg by mouth 2 (two) times daily. 02/26/14  Yes Historical Provider, MD  CRESTOR 10 MG tablet Take 10 mg by mouth daily. Takes one every other day 03/05/14  Yes Historical Provider, MD  furosemide (LASIX) 20 MG tablet Take 20 mg by mouth daily. 02/21/14  Yes Historical Provider, MD  LYRICA 75 MG capsule Take 75 mg by mouth 2 (two) times daily. 75mg  in the morning and 150mg  at night 04/27/14  Yes Historical Provider, MD  metFORMIN (GLUCOPHAGE) 500 MG tablet Take 1,000 mg by mouth. 02/24/14  Yes Historical Provider, MD  traMADol (ULTRAM) 50 MG tablet Take 100 mg by mouth. Every 4 hours 04/10/14  Yes Historical Provider, MD  ULORIC 80 MG TABS Take 1 tablet by mouth daily. 02/22/14  Yes Historical Provider, MD     Review of systems complete and found to be negative unless listed above in HPI     Physical exam Blood pressure 179/99, pulse 100, temperature 99.1 F (37.3 C), temperature source Oral, resp. rate 23, height 5\' 7"  (1.702 m), weight 223 lb 5.2 oz (101.3 kg), SpO2 95 %. General: NAD Neck: No JVD, no thyromegaly or thyroid nodule.  Lungs: Clear to auscultation bilaterally with normal respiratory effort. CV: Tachycardic, irregular rhythm, normal S1/S2, no S3, no murmur.  No peripheral edema.    Abdomen: Soft, nontender, no distention.  Neurologic: Alert.  Psych: Normal affect. Extremities: No clubbing or cyanosis.  HEENT: Normal.   ECG: Most recent ECG reviewed.  Labs:   Lab Results  Component Value Date   WBC 9.8 05/05/2014   HGB 13.6 05/05/2014   HCT 39.0 05/05/2014  MCV 96.3 05/05/2014   PLT 206 05/05/2014    Recent Labs Lab 05/05/14 0500  NA 134*  K 3.6  CL 99  CO2 24  BUN 28*  CREATININE 0.83  CALCIUM 8.4  PROT 7.4  BILITOT 1.8*  ALKPHOS 120*  ALT 111*  AST 167*  GLUCOSE 126*   No results found for: CKTOTAL, CKMB, CKMBINDEX, TROPONINI No results found for: CHOL No results found for: HDL No results found for: LDLCALC No results found for: TRIG No results found for: CHOLHDL No results found for: LDLDIRECT       Studies: Ct Pelvis W Contrast  05/04/2014   CLINICAL DATA:  Right hip pain and fever for 3 days. History of total hip replacement 2009.  EXAM: CT PELVIS WITH CONTRAST  TECHNIQUE: Multidetector CT imaging of the pelvis was performed using the standard protocol following the bolus administration of intravenous contrast.  CONTRAST:  37mL OMNIPAQUE IOHEXOL 300 MG/ML  SOLN  COMPARISON:  05/04/2014 and MR 02/26/2010  FINDINGS: There is soft tissue density fullness with faint rim enhancement along the lower aspect of the right iliopsoas muscle. This measures 5.2 x 3.5 x 8.5 cm. Centrally this measures soft tissue density. Considerations include abscess, phlegmon, myositis, or other soft tissue mass. Further evaluation with MRI may be helpful. Patient has had previous right total hip arthroplasty. Prosthetic components appear well seated. There is no evidence for periprosthetic loosening. Degenerative changes are seen in the lower lumbar spine. There is dense atherosclerosis of the abdominal aorta. There is significant sigmoid diverticulosis.  IMPRESSION: 1. Rim enhancing mass along the lower aspect of the iliopsoas muscle, suspicious for abscess or  phlegmon. Myositis or other soft tissue mass is also a consideration. Further evaluation with MRI may be helpful. 2. Status post right total hip arthroplasty without adverse features. 3. Significant sigmoid diverticulosis.   Electronically Signed   By: Nolon Nations M.D.   On: 05/04/2014 20:39   Dg Chest Port 1 View  05/04/2014   CLINICAL DATA:  Fever.  Fatigue.  Altered mental status.  EXAM: PORTABLE CHEST - 1 VIEW  COMPARISON:  05/13/2010  FINDINGS: Heart size is within normal limits and stable allowing for portable technique. Atherosclerotic calcification of thoracic aorta again noted. Both lungs are clear. No evidence of pneumothorax or pleural effusion. Bilateral shoulder prostheses again noted.  IMPRESSION: Stable exam.  No active disease.   Electronically Signed   By: Earle Gell M.D.   On: 05/04/2014 16:25   Dg Knee Complete 4 Views Right  05/04/2014   CLINICAL DATA:  Fatigue and fever. Right lower extremity pain. Altered mental status.  EXAM: RIGHT KNEE - COMPLETE 4+ VIEW  COMPARISON:  None.  FINDINGS: Total knee prosthesis observed. No fracture or complicating feature identified. Polymer spacer remains in place.  Distal SFA and popliteal artery atherosclerotic calcification.  IMPRESSION: 1. Total knee prosthesis without complicating feature identified. 2. Atherosclerosis.   Electronically Signed   By: Van Clines M.D.   On: 05/04/2014 16:28   Dg Hip Unilat With Pelvis 2-3 Views Right  05/04/2014   CLINICAL DATA:  Right lateral hip pain. History of arthroplasty. Altered mental status. Denies fall. Fatigue.  EXAM: RIGHT HIP (WITH PELVIS) 2-3 VIEWS  COMPARISON:  05/13/2010  FINDINGS: Patient has had previous right hip arthroplasty. There is no evidence for loosening or subluxation. There is dense atherosclerotic calcification of the femoral arteries. Degenerative changes are noted in the lower lumbar spine. No acute fracture.  IMPRESSION: No evidence for  acute  abnormality.   Electronically  Signed   By: Nolon Nations M.D.   On: 05/04/2014 16:27    ASSESSMENT AND PLAN:  1. New-onset rapid atrial fibrillation: Most likely triggered by iliopsoas abscess, but chronic alcoholism also likely contributing. Will obtain a TSH. Will obtain echocardiogram once HR is more optimally controlled to assess both LV function and LA volume. Once IR has completed abscess drainage and it has been deemed safe, and if atrial fibrillation is nonvalvular, would recommend anticoagulation with Xarelto 20 mg daily for feasibility. CHADSVASC is 4, thus thromboembolic risk is high. However, given chronic alcoholism, compliance may be an issue. IV diltiazem was just initiated and can easily be titrated up for more optimal HR control, as BP is high and will tolerate. I would then consider transitioning to oral long-acting diltiazem (rather than atenolol) if LV systolic function is normal.  2. Essential HTN: As stated previously, consider transitioning to oral long-acting diltiazem (rather than atenolol) if LV systolic function is normal which may help with BP control as well.  3. Iliopsoas absess: Currently on IV fluids and broad-spectrum antimicrobial therapy, with plans for abscess drainage.   Signed: Kate Sable, M.D., F.A.C.C.  05/05/2014, 11:03 AM

## 2014-05-05 NOTE — Progress Notes (Signed)
[facility] PROGRESS NOTE   [name] XBM:[id number] DOB: 10/16/1935 DOA: 05/04/2014 PCP: Lujean Amel, MD  HPI/Subjective: Denies fever chills, heart rate is in the 120-140 range, atrial fibrillation. Blood pressure stable, started on Cardizem drip  Assessment/Plan: Principal Problem:   Sepsis Active Problems:   Psoas abscess   New onset atrial fibrillation   Diabetes mellitus type 2, controlled   Essential hypertension   Gout    Sepsis Present on admission with temperature of 101.5, heart rate of 92 with presence of infection. The treated with broad-spectrum antibiotics and IV fluid hydration. Blood cultures obtained, patient will have IR for psoas muscle draining.  Right psoas muscle abscess Patient placed on broad-spectrum antibiotics, blood cultures obtained. General surgery consulted, recommended interventional radiology for drain placement. We'll follow the culture from the drain as well as blood cultures.  New onset of atrial fibrillation A. fib with RVR, heart rate was 120-140, started on Cardizem drip. Cardiology consulted for further evaluation.  Diabetes mellitus type 2 Currently nothing by mouth but will be started on carb modified diet. Check hemoglobin A1c and sliding scale.  Alcoholism Placed on CIWA protocol if patient develops withdrawal symptoms.    Code Status: Full Code Family Communication: Plan discussed with the patient. Disposition Plan: Remains inpatient Diet: Diet NPO time specified  Consultants:  Gen. surgery.  IR  Procedures:  None, will have drain placement for the psoas abscess  Antibiotics:  Currently on vancomycin and Zosyn.   Objective: Filed Vitals:   05/05/14 0800  BP:   Pulse:   Temp: 99.1 F (37.3 C)  Resp:     Intake/Output Summary (Last 24 hours) at 05/05/14 1029 Last data filed at 05/05/14 0600  Gross per 24 hour  Intake    550 ml  Output    800 ml  Net   -250 ml   Filed Weights   05/04/14 1617 05/04/14 2319  Weight: 102.059 kg (225 lb) 101.3 kg (223 lb 5.2 oz)    Exam: General: Alert and awake, oriented x3, not in any acute distress. HEENT: anicteric sclera, pupils reactive to light and accommodation, EOMI CVS: S1-S2 clear, no murmur rubs or gallops Chest: clear to auscultation bilaterally, no wheezing, rales or rhonchi Abdomen: soft nontender, nondistended, normal bowel sounds, no organomegaly Extremities: no cyanosis, clubbing or edema noted bilaterally Neuro: Cranial nerves II-XII intact, no focal neurological deficits  Data Reviewed: Basic Metabolic Panel:  Recent Labs Lab 05/04/14 1500 05/05/14 0500  NA 133* 134*  K 3.6 3.6  CL 94* 99  CO2 22 24  GLUCOSE 146* 126*  BUN 38* 28*  CREATININE 0.78 0.83  CALCIUM 8.9 8.4   Liver Function Tests:  Recent Labs Lab 05/04/14 1500 05/05/14 0500  AST 152* 167*  ALT 92* 111*  ALKPHOS 111 120*  BILITOT 1.6* 1.8*  PROT 7.4 7.4  ALBUMIN 3.6 3.5   No results for input(s): LIPASE, AMYLASE in the last 168 hours. No results for input(s): AMMONIA in the last 168 hours. CBC:  Recent Labs Lab 05/04/14 1500 05/05/14 0500  WBC 10.3 9.8  NEUTROABS 8.0* 7.2  HGB 13.0 13.6  HCT 37.7* 39.0  MCV 95.2 96.3  PLT 180 206   Cardiac Enzymes: No results for input(s): CKTOTAL, CKMB, CKMBINDEX, TROPONINI in the last 168 hours. BNP (last 3 results) No results for input(s): BNP in the last 8760 hours.  ProBNP (last 3 results) No results for input(s): PROBNP in the last 8760 hours.  CBG:  Recent Labs Lab 05/05/14  [place] Recent Results (from the past 240 hour(s))  Blood Culture (routine x 2)     Status: None (Preliminary result)   Collection Time: 05/04/14  3:00 PM  Result Value Ref Range Status   Specimen Description BLOOD LEFT WRIST  5 ML IN [facility] BOTTLE  Final   Special Requests NONE  Final   Culture   Final           BLOOD CULTURE RECEIVED NO GROWTH TO DATE CULTURE WILL BE  HELD FOR 5 DAYS BEFORE ISSUING A FINAL NEGATIVE REPORT Performed at [facility]    Report Status PENDING  Incomplete  Blood Culture (routine x 2)     Status: None (Preliminary result)   Collection Time: 05/04/14  4:21 PM  Result Value Ref Range Status   Specimen Description BLOOD RIGHT ANTECUBITAL  Final   Special Requests BOTTLES DRAWN AEROBIC AND ANAEROBIC 5CC  Final   Culture   Final           BLOOD CULTURE RECEIVED NO GROWTH TO DATE CULTURE WILL BE HELD FOR 5 DAYS BEFORE ISSUING A FINAL NEGATIVE REPORT Performed at [facility]    Report Status PENDING  Incomplete  MRSA PCR Screening     Status: None   Collection Time: 05/04/14 11:24 PM  Result Value Ref Range Status   MRSA by PCR NEGATIVE NEGATIVE Final    Comment:        The GeneXpert MRSA Assay (FDA approved for NASAL specimens only), is one component of a comprehensive MRSA colonization surveillance program. It is not intended to diagnose MRSA infection nor to guide or monitor treatment for MRSA infections.      Studies: Ct Pelvis W Contrast  05/04/2014   CLINICAL DATA:  Right hip pain and fever for 3 days. History of total hip replacement 2009.  EXAM: CT PELVIS WITH CONTRAST  TECHNIQUE: Multidetector CT imaging of the pelvis was performed using the standard protocol following the bolus administration of intravenous contrast.  CONTRAST:  16mL OMNIPAQUE IOHEXOL 300 MG/ML  SOLN  COMPARISON:  05/04/2014 and MR 02/26/2010  FINDINGS: There is soft tissue density fullness with faint rim enhancement along the lower aspect of the right iliopsoas muscle. This measures 5.2 x 3.5 x 8.5 cm. Centrally this measures soft tissue density. Considerations include abscess, phlegmon, myositis, or other soft tissue mass. Further evaluation with MRI may be helpful. Patient has had previous right total hip arthroplasty. Prosthetic components appear well seated. There is no evidence for periprosthetic loosening. Degenerative changes  are seen in the lower lumbar spine. There is dense atherosclerosis of the abdominal aorta. There is significant sigmoid diverticulosis.  IMPRESSION: 1. Rim enhancing mass along the lower aspect of the iliopsoas muscle, suspicious for abscess or phlegmon. Myositis or other soft tissue mass is also a consideration. Further evaluation with MRI may be helpful. 2. Status post right total hip arthroplasty without adverse features. 3. Significant sigmoid diverticulosis.   Electronically Signed   By: Nolon Nations M.D.   On: 05/04/2014 20:39   Dg Chest Port 1 View  05/04/2014   CLINICAL DATA:  Fever.  Fatigue.  Altered mental status.  EXAM: PORTABLE CHEST - 1 VIEW  COMPARISON:  05/13/2010  FINDINGS: Heart size is within normal limits and stable allowing for portable technique. Atherosclerotic calcification of thoracic aorta again noted. Both lungs are clear. No evidence of pneumothorax or pleural effusion. Bilateral shoulder prostheses again noted.  IMPRESSION: Stable  exam.  No active disease.   Electronically Signed   By: Earle Gell M.D.   On: 05/04/2014 16:25   Dg Knee Complete 4 Views Right  05/04/2014   CLINICAL DATA:  Fatigue and fever. Right lower extremity pain. Altered mental status.  EXAM: RIGHT KNEE - COMPLETE 4+ VIEW  COMPARISON:  None.  FINDINGS: Total knee prosthesis observed. No fracture or complicating feature identified. Polymer spacer remains in place.  Distal SFA and popliteal artery atherosclerotic calcification.  IMPRESSION: 1. Total knee prosthesis without complicating feature identified. 2. Atherosclerosis.   Electronically Signed   By: Van Clines M.D.   On: 05/04/2014 16:28   Dg Hip Unilat With Pelvis 2-3 Views Right  05/04/2014   CLINICAL DATA:  Right lateral hip pain. History of arthroplasty. Altered mental status. Denies fall. Fatigue.  EXAM: RIGHT HIP (WITH PELVIS) 2-3 VIEWS  COMPARISON:  05/13/2010  FINDINGS: Patient has had previous right hip arthroplasty. There is no  evidence for loosening or subluxation. There is dense atherosclerotic calcification of the femoral arteries. Degenerative changes are noted in the lower lumbar spine. No acute fracture.  IMPRESSION: No evidence for acute  abnormality.   Electronically Signed   By: Nolon Nations M.D.   On: 05/04/2014 16:27    Scheduled Meds: . diltiazem  10 mg Intravenous Once  . febuxostat  80 mg Oral Daily  . folic acid  1 mg Oral Daily  . insulin aspart  0-9 Units Subcutaneous Q4H  . LORazepam  0-4 mg Intravenous Q6H   Followed by  . [START ON 05/07/2014] LORazepam  0-4 mg Intravenous Q12H  . multivitamin with minerals  1 tablet Oral Daily  . piperacillin-tazobactam (ZOSYN)  IV  3.375 g Intravenous Q8H  . thiamine  100 mg Oral Daily   Or  . thiamine  100 mg Intravenous Daily  . vancomycin  750 mg Intravenous Q12H   Continuous Infusions: . sodium chloride 100 mL/hr at 05/05/14 0208  . diltiazem (CARDIZEM) infusion         Time spent: 35 minutes    [facility]  [facility] Pager [phone] If 7PM-7AM, please contact night-coverage at [email or web address], password [facility] 05/05/2014, 10:29 AM  LOS: 1 day

## 2014-05-05 NOTE — Progress Notes (Signed)
CRITICAL VALUE ALERT  Critical value received:  Positive bld cx-gram + cocci in chains  Date of notification:  05/05/2014   Time of notification:  1702  Critical value read back:Yes.    Nurse who received alert:  Dorrene German, RN  MD notified (1st page):  Dr. Hartford Poli  Time of first page:  1742  MD notified (2nd page):   Time of second page:  Responding MD:  Dr. Hartford Poli  Time MD responded:  [phone]

## 2014-05-05 NOTE — Progress Notes (Signed)
Patient ID: [name], male   DOB: [birth year], 79 y.o.   MRN: [id number]    Subjective: First says he "feels great" then "ready to pack it in". Denies pain. Frustrated with IVs and monitoring lines.  Objective: Vital signs in last 24 hours: Temp:  [98.4 F (36.9 C)-101.5 F (38.6 C)] 98.9 F (37.2 C) (04/30 0400) Pulse Rate:  [66-100] 100 (04/30 0600) Resp:  [12-24] 23 (04/30 0600) BP: (116-191)/(59-99) 179/99 mmHg (04/30 0600) SpO2:  [91 %-98 %] 95 % (04/30 0600) Weight:  [101.3 kg (223 lb 5.2 oz)-102.059 kg (225 lb)] 101.3 kg (223 lb 5.2 oz) (04/29 2319)    Intake/Output from previous day: 04/29 0701 - 04/30 0700 In: 550 [I.V.:500; IV Piggyback:50] Out: 800 [Urine:800] Intake/Output this shift:    General appearance: alert, cooperative and seems mildly confused. GI: normal findings: soft, non-tender  Lab Results:   Recent Labs  05/04/14 1500 05/05/14 0500  WBC 10.3 9.8  HGB 13.0 13.6  HCT 37.7* 39.0  PLT 180 206   BMET  Recent Labs  05/04/14 1500 05/05/14 0500  NA 133* 134*  K 3.6 3.6  CL 94* 99  CO2 22 24  GLUCOSE 146* 126*  BUN 38* 28*  CREATININE 0.78 0.83  CALCIUM 8.9 8.4     Studies/Results: Ct Pelvis W Contrast  05/04/2014   CLINICAL DATA:  Right hip pain and fever for 3 days. History of total hip replacement 2009.  EXAM: CT PELVIS WITH CONTRAST  TECHNIQUE: Multidetector CT imaging of the pelvis was performed using the standard protocol following the bolus administration of intravenous contrast.  CONTRAST:  56mL OMNIPAQUE IOHEXOL 300 MG/ML  SOLN  COMPARISON:  05/04/2014 and MR 02/26/2010  FINDINGS: There is soft tissue density fullness with faint rim enhancement along the lower aspect of the right iliopsoas muscle. This measures 5.2 x 3.5 x 8.5 cm. Centrally this measures soft tissue density. Considerations include abscess, phlegmon, myositis, or other soft tissue mass. Further evaluation with MRI may be helpful. Patient has had previous right total  hip arthroplasty. Prosthetic components appear well seated. There is no evidence for periprosthetic loosening. Degenerative changes are seen in the lower lumbar spine. There is dense atherosclerosis of the abdominal aorta. There is significant sigmoid diverticulosis.  IMPRESSION: 1. Rim enhancing mass along the lower aspect of the iliopsoas muscle, suspicious for abscess or phlegmon. Myositis or other soft tissue mass is also a consideration. Further evaluation with MRI may be helpful. 2. Status post right total hip arthroplasty without adverse features. 3. Significant sigmoid diverticulosis.   Electronically Signed   By: Nolon Nations M.D.   On: 05/04/2014 20:39   Dg Chest Port 1 View  05/04/2014   CLINICAL DATA:  Fever.  Fatigue.  Altered mental status.  EXAM: PORTABLE CHEST - 1 VIEW  COMPARISON:  05/13/2010  FINDINGS: Heart size is within normal limits and stable allowing for portable technique. Atherosclerotic calcification of thoracic aorta again noted. Both lungs are clear. No evidence of pneumothorax or pleural effusion. Bilateral shoulder prostheses again noted.  IMPRESSION: Stable exam.  No active disease.   Electronically Signed   By: Earle Gell M.D.   On: 05/04/2014 16:25   Dg Knee Complete 4 Views Right  05/04/2014   CLINICAL DATA:  Fatigue and fever. Right lower extremity pain. Altered mental status.  EXAM: RIGHT KNEE - COMPLETE 4+ VIEW  COMPARISON:  None.  FINDINGS: Total knee prosthesis observed. No fracture or complicating feature identified. Polymer spacer remains in  place.  Distal SFA and popliteal artery atherosclerotic calcification.  IMPRESSION: 1. Total knee prosthesis without complicating feature identified. 2. Atherosclerosis.   Electronically Signed   By: Van Clines M.D.   On: 05/04/2014 16:28   Dg Hip Unilat With Pelvis 2-3 Views Right  05/04/2014   CLINICAL DATA:  Right lateral hip pain. History of arthroplasty. Altered mental status. Denies fall. Fatigue.  EXAM: RIGHT  HIP (WITH PELVIS) 2-3 VIEWS  COMPARISON:  05/13/2010  FINDINGS: Patient has had previous right hip arthroplasty. There is no evidence for loosening or subluxation. There is dense atherosclerotic calcification of the femoral arteries. Degenerative changes are noted in the lower lumbar spine. No acute fracture.  IMPRESSION: No evidence for acute  abnormality.   Electronically Signed   By: Nolon Nations M.D.   On: 05/04/2014 16:27    Anti-infectives: Anti-infectives    Start     Dose/Rate Route Frequency Ordered Stop   05/05/14 0500  vancomycin (VANCOCIN) IVPB 750 mg/150 ml premix     750 mg 150 mL/hr over 60 Minutes Intravenous Every 12 hours 05/04/14 1642     05/04/14 2300  piperacillin-tazobactam (ZOSYN) IVPB 3.375 g     3.375 g 12.5 mL/hr over 240 Minutes Intravenous Every 8 hours 05/04/14 1642     05/04/14 1645  vancomycin (VANCOCIN) IVPB 1000 mg/200 mL premix     1,000 mg 200 mL/hr over 60 Minutes Intravenous NOW 05/04/14 1642 05/04/14 1900   05/04/14 1615  piperacillin-tazobactam (ZOSYN) IVPB 3.375 g     3.375 g 100 mL/hr over 30 Minutes Intravenous  Once 05/04/14 1603 05/04/14 1900   05/04/14 1615  vancomycin (VANCOCIN) IVPB 1000 mg/200 mL premix     1,000 mg 200 mL/hr over 60 Minutes Intravenous  Once 05/04/14 1603 05/04/14 1900      Assessment/Plan: Right iliopsoas abscess/phlegmon. On appropriate antibiotics. I have a call in to interventional radiology regarding percutaneous aspiration or drainage. No apparent GI source on imaging.     LOS: 1 day    Lametria Klunk T 05/05/2014

## 2014-05-06 ENCOUNTER — Inpatient Hospital Stay (HOSPITAL_COMMUNITY): Payer: Medicare Other

## 2014-05-06 ENCOUNTER — Encounter (HOSPITAL_COMMUNITY): Payer: Self-pay | Admitting: Radiology

## 2014-05-06 DIAGNOSIS — R7881 Bacteremia: Secondary | ICD-10-CM

## 2014-05-06 DIAGNOSIS — K650 Generalized (acute) peritonitis: Secondary | ICD-10-CM

## 2014-05-06 DIAGNOSIS — I4891 Unspecified atrial fibrillation: Secondary | ICD-10-CM

## 2014-05-06 DIAGNOSIS — M25551 Pain in right hip: Secondary | ICD-10-CM | POA: Insufficient documentation

## 2014-05-06 DIAGNOSIS — F10231 Alcohol dependence with withdrawal delirium: Secondary | ICD-10-CM

## 2014-05-06 DIAGNOSIS — K651 Peritoneal abscess: Secondary | ICD-10-CM | POA: Insufficient documentation

## 2014-05-06 LAB — BLOOD GAS, ARTERIAL
Acid-Base Excess: 1.2 mmol/L (ref 0.0–2.0)
BICARBONATE: 21.4 meq/L (ref 20.0–24.0)
FIO2: 0.21 %
O2 SAT: 94.1 %
PATIENT TEMPERATURE: 37
PO2 ART: 69.5 mmHg — AB (ref 80.0–100.0)
TCO2: 17.7 mmol/L (ref 0–100)
pCO2 arterial: 24.5 mmHg — ABNORMAL LOW (ref 35.0–45.0)
pH, Arterial: 7.551 — ABNORMAL HIGH (ref 7.350–7.450)

## 2014-05-06 LAB — GLUCOSE, CAPILLARY
GLUCOSE-CAPILLARY: 129 mg/dL — AB (ref 70–99)
GLUCOSE-CAPILLARY: 132 mg/dL — AB (ref 70–99)
Glucose-Capillary: 155 mg/dL — ABNORMAL HIGH (ref 70–99)
Glucose-Capillary: 143 mg/dL — ABNORMAL HIGH (ref 70–99)
Glucose-Capillary: 153 mg/dL — ABNORMAL HIGH (ref 70–99)
Glucose-Capillary: 166 mg/dL — ABNORMAL HIGH (ref 70–99)

## 2014-05-06 MED ORDER — HALOPERIDOL LACTATE 5 MG/ML IJ SOLN
2.0000 mg | Freq: Four times a day (QID) | INTRAMUSCULAR | Status: DC | PRN
  Administered 2014-05-06 – 2014-05-07 (×2): 2 mg via INTRAVENOUS
  Filled 2014-05-06 (×2): qty 1

## 2014-05-06 MED ORDER — IOHEXOL 300 MG/ML  SOLN: 100.0000 mL | Freq: Once | INTRAMUSCULAR | Status: DC | PRN

## 2014-05-06 MED ORDER — METOPROLOL TARTRATE 1 MG/ML IV SOLN
5.0000 mg | Freq: Once | INTRAVENOUS | Status: DC
  Filled 2014-05-06 (×2): qty 5

## 2014-05-06 MED ORDER — HYDRALAZINE HCL 20 MG/ML IJ SOLN
10.0000 mg | INTRAMUSCULAR | Status: DC | PRN
  Administered 2014-05-06 – 2014-05-08 (×7): 10 mg via INTRAVENOUS
  Filled 2014-05-06 (×6): qty 1

## 2014-05-06 MED ORDER — SODIUM CHLORIDE 0.9 % IV SOLN
INTRAVENOUS | Status: DC
  Administered 2014-05-06: 17:00:00 via INTRAVENOUS

## 2014-05-06 NOTE — Progress Notes (Signed)
Pt remains to have intermittant tachypnea and confusion. He becomes agitated with movement and/ or  Repositioning in bed. He has an increased temperature and is restless.

## 2014-05-06 NOTE — Progress Notes (Signed)
[facility] PROGRESS NOTE   [name] WUJ:[id number] DOB: Sep 17, 1935 DOA: 05/04/2014 PCP: Lujean Amel, MD  HPI/Subjective: Patient is confused, agitated, per nursing staff the Ativan making him more confused. 1/2 blood cultures positive for GPC. Tried to contact daughter Reather Laurence without success to ask about alcohol drinking.  Assessment/Plan: Principal Problem:   Sepsis Active Problems:   Psoas abscess   New onset atrial fibrillation   Diabetes mellitus type 2, controlled   Essential hypertension   Gout   Psoas abscess, right   Iliopsoas abscess on right   Atrial fibrillation with rapid ventricular response   Alcoholism   Type 2 diabetes mellitus without complication   Pyrexia    Sepsis Present on admission with temperature of 101.5, heart rate of 92 with presence of infection. The treated with broad-spectrum antibiotics and IV fluid hydration. Blood cultures obtained, patient will have IR for psoas muscle draining. Per notes IR not able to obtain consent for the procedure.  Right psoas muscle abscess Patient placed on broad-spectrum antibiotics, blood cultures obtained. General surgery consulted, recommended interventional radiology for drain placement. We'll follow the culture from the drain as well as blood cultures. No drainage.  Acute encephalopathy Patient is confused, acute delirium and agitated. This could be acute metabolic encephalopathy secondary to sepsis. Also withdrawing from alcohol is probably contributing. Continue CIWA protocol. Added Haldol for agitation.  New onset of atrial fibrillation A. fib with RVR, heart rate was 120-140, started on Cardizem drip. Cardiology consulted for further evaluation.  Diabetes mellitus type 2 Currently nothing by mouth but will be started on carb modified diet. Check hemoglobin A1c and sliding scale.  Alcoholism Placed on CIWA protocol if patient develops withdrawal symptoms.    Code Status:  Full Code Family Communication: Plan discussed with the patient. Disposition Plan: Remains inpatient Diet: Diet NPO time specified  Consultants:  Gen. surgery.  IR  Procedures:  None, will have drain placement for the psoas abscess  Antibiotics:  Currently on vancomycin and Zosyn.   Objective: Filed Vitals:   05/06/14 1100  BP: 164/101  Pulse: 90  Temp:   Resp: 16    Intake/Output Summary (Last 24 hours) at 05/06/14 1151 Last data filed at 05/06/14 1100  Gross per 24 hour  Intake 1850.31 ml  Output   1425 ml  Net 425.31 ml   Filed Weights   05/04/14 1617 05/04/14 2319  Weight: 102.059 kg (225 lb) 101.3 kg (223 lb 5.2 oz)    Exam: General: Alert and awake, oriented x3, not in any acute distress. HEENT: anicteric sclera, pupils reactive to light and accommodation, EOMI CVS: S1-S2 clear, no murmur rubs or gallops Chest: clear to auscultation bilaterally, no wheezing, rales or rhonchi Abdomen: soft nontender, nondistended, normal bowel sounds, no organomegaly Extremities: no cyanosis, clubbing or edema noted bilaterally Neuro: Cranial nerves II-XII intact, no focal neurological deficits  Data Reviewed: Basic Metabolic Panel:  Recent Labs Lab 05/04/14 1500 05/05/14 0500  NA 133* 134*  K 3.6 3.6  CL 94* 99  CO2 22 24  GLUCOSE 146* 126*  BUN 38* 28*  CREATININE 0.78 0.83  CALCIUM 8.9 8.4   Liver Function Tests:  Recent Labs Lab 05/04/14 1500 05/05/14 0500  AST 152* 167*  ALT 92* 111*  ALKPHOS 111 120*  BILITOT 1.6* 1.8*  PROT 7.4 7.4  ALBUMIN 3.6 3.5   No results for input(s): LIPASE, AMYLASE in the last 168 hours. No results for input(s): AMMONIA in the last 168 hours. CBC:  Recent Labs Lab 05/04/14 1500 05/05/14 0500  WBC 10.3 9.8  NEUTROABS 8.0* 7.2  HGB 13.0 13.6  HCT 37.7* 39.0  MCV 95.2 96.3  PLT 180 206   Cardiac Enzymes: No results for input(s): CKTOTAL, CKMB, CKMBINDEX, TROPONINI in the last 168 hours. BNP (last 3  results) No results for input(s): BNP in the last 8760 hours.  ProBNP (last 3 results) No results for input(s): PROBNP in the last 8760 hours.  CBG:  Recent Labs Lab 05/05/14 1552 05/05/14 2020 05/06/14 0025 05/06/14 0412 05/06/14 0821  GLUCAP 154* 132* 129* 132* 155*    Micro Recent Results (from the past 240 hour(s))  Blood Culture (routine x 2)     Status: None (Preliminary result)   Collection Time: 05/04/14  3:00 PM  Result Value Ref Range Status   Specimen Description BLOOD LEFT WRIST  5 ML IN [facility] BOTTLE  Final   Special Requests NONE  Final   Culture   Final           BLOOD CULTURE RECEIVED NO GROWTH TO DATE CULTURE WILL BE HELD FOR 5 DAYS BEFORE ISSUING A FINAL NEGATIVE REPORT Performed at [facility]    Report Status PENDING  Incomplete  Blood Culture (routine x 2)     Status: None (Preliminary result)   Collection Time: 05/04/14  4:21 PM  Result Value Ref Range Status   Specimen Description BLOOD RIGHT ANTECUBITAL  Final   Special Requests BOTTLES DRAWN AEROBIC AND ANAEROBIC 5CC  Final   Culture   Final    GRAM POSITIVE COCCI IN CHAINS Note: Gram Stain Report Called to,Read Back By and Verified With: CAROLYN OAKLEY 4.30.16 BY MANGR 74PM Performed at [facility]    Report Status PENDING  Incomplete  Urine culture     Status: None   Collection Time: 05/04/14  5:51 PM  Result Value Ref Range Status   Specimen Description URINE, CLEAN CATCH  Final   Special Requests NONE  Final   Colony Count   Final    7,000 COLONIES/ML Performed at [facility]    Culture   Final    INSIGNIFICANT GROWTH Performed at [facility]    Report Status 05/05/2014 FINAL  Final  MRSA PCR Screening     Status: None   Collection Time: 05/04/14 11:24 PM  Result Value Ref Range Status   MRSA by PCR NEGATIVE NEGATIVE Final    Comment:        The GeneXpert MRSA Assay (FDA approved for NASAL specimens only), is one component of a comprehensive  MRSA colonization surveillance program. It is not intended to diagnose MRSA infection nor to guide or monitor treatment for MRSA infections.      Studies: Ct Pelvis W Contrast  05/04/2014   CLINICAL DATA:  Right hip pain and fever for 3 days. History of total hip replacement 2009.  EXAM: CT PELVIS WITH CONTRAST  TECHNIQUE: Multidetector CT imaging of the pelvis was performed using the standard protocol following the bolus administration of intravenous contrast.  CONTRAST:  83mL OMNIPAQUE IOHEXOL 300 MG/ML  SOLN  COMPARISON:  05/04/2014 and MR 02/26/2010  FINDINGS: There is soft tissue density fullness with faint rim enhancement along the lower aspect of the right iliopsoas muscle. This measures 5.2 x 3.5 x 8.5 cm. Centrally this measures soft tissue density. Considerations include abscess, phlegmon, myositis, or other soft tissue mass. Further evaluation with MRI may be helpful. Patient has had previous right total hip  arthroplasty. Prosthetic components appear well seated. There is no evidence for periprosthetic loosening. Degenerative changes are seen in the lower lumbar spine. There is dense atherosclerosis of the abdominal aorta. There is significant sigmoid diverticulosis.  IMPRESSION: 1. Rim enhancing mass along the lower aspect of the iliopsoas muscle, suspicious for abscess or phlegmon. Myositis or other soft tissue mass is also a consideration. Further evaluation with MRI may be helpful. 2. Status post right total hip arthroplasty without adverse features. 3. Significant sigmoid diverticulosis.   Electronically Signed   By: Nolon Nations M.D.   On: 05/04/2014 20:39   Dg Chest Port 1 View  05/04/2014   CLINICAL DATA:  Fever.  Fatigue.  Altered mental status.  EXAM: PORTABLE CHEST - 1 VIEW  COMPARISON:  05/13/2010  FINDINGS: Heart size is within normal limits and stable allowing for portable technique. Atherosclerotic calcification of thoracic aorta again noted. Both lungs are clear. No  evidence of pneumothorax or pleural effusion. Bilateral shoulder prostheses again noted.  IMPRESSION: Stable exam.  No active disease.   Electronically Signed   By: Earle Gell M.D.   On: 05/04/2014 16:25   Dg Knee Complete 4 Views Right  05/04/2014   CLINICAL DATA:  Fatigue and fever. Right lower extremity pain. Altered mental status.  EXAM: RIGHT KNEE - COMPLETE 4+ VIEW  COMPARISON:  None.  FINDINGS: Total knee prosthesis observed. No fracture or complicating feature identified. Polymer spacer remains in place.  Distal SFA and popliteal artery atherosclerotic calcification.  IMPRESSION: 1. Total knee prosthesis without complicating feature identified. 2. Atherosclerosis.   Electronically Signed   By: Van Clines M.D.   On: 05/04/2014 16:28   Dg Hip Unilat With Pelvis 2-3 Views Right  05/04/2014   CLINICAL DATA:  Right lateral hip pain. History of arthroplasty. Altered mental status. Denies fall. Fatigue.  EXAM: RIGHT HIP (WITH PELVIS) 2-3 VIEWS  COMPARISON:  05/13/2010  FINDINGS: Patient has had previous right hip arthroplasty. There is no evidence for loosening or subluxation. There is dense atherosclerotic calcification of the femoral arteries. Degenerative changes are noted in the lower lumbar spine. No acute fracture.  IMPRESSION: No evidence for acute  abnormality.   Electronically Signed   By: Nolon Nations M.D.   On: 05/04/2014 16:27    Scheduled Meds: . antiseptic oral rinse  7 mL Mouth Rinse BID  . febuxostat  80 mg Oral Daily  . folic acid  1 mg Oral Daily  . insulin aspart  0-9 Units Subcutaneous Q4H  . LORazepam  0-4 mg Intravenous Q6H   Followed by  . [START ON 05/07/2014] LORazepam  0-4 mg Intravenous Q12H  . metoprolol  5 mg Intravenous Once  . multivitamin with minerals  1 tablet Oral Daily  . piperacillin-tazobactam (ZOSYN)  IV  3.375 g Intravenous Q8H  . thiamine  100 mg Oral Daily   Or  . thiamine  100 mg Intravenous Daily  . vancomycin  750 mg Intravenous Q12H    Continuous Infusions: . diltiazem (CARDIZEM) infusion 5 mg/hr (05/06/14 1020)       Time spent: 35 minutes    [facility] A  [facility] Pager [phone] If 7PM-7AM, please contact night-coverage at [email or web address], password [facility] 05/06/2014, 11:51 AM  LOS: 2 days

## 2014-05-06 NOTE — Progress Notes (Signed)
  Echocardiogram 2D Echocardiogram has been performed.  [name] 05/06/2014, 10:35 AM

## 2014-05-06 NOTE — Progress Notes (Signed)
Received this patient at 1430. Assessment was done. Received call from [place] Dr.Wagner about patient being able to go for procedure. Notified him about patient's blood pressure and that prn blood pressure medication was given an hour ago. Was told to get patient ready for transport to IR stat. Informed charge RN who accompanied patient to procedural area.

## 2014-05-06 NOTE — Procedures (Signed)
Interventional Radiology Procedure Note  Procedure: CT guided aspiration of left psoas hematoma.  58F drain into the collection.  Solid, non-liquified hematoma, with no fluid aspirated.   Findings:  42 F drain into the largest component of psoas collection.  No fluid aspirated.  Non-liquified hematoma uncovered.   Complications: None Recommendations: -  - Routine wound care.   Signed,  Dulcy Fanny. Earleen Newport, DO

## 2014-05-06 NOTE — Progress Notes (Signed)
Pt.returned from interventional radiology procedure around 1630. He appeared to be tachypneic. He is responsive to voice and touch, he becomes agitated and will curse. Attending MD, Dr.Elmahi was notified about patient's tachypnea and cognition. New orders were written and he came to assess patient. Paged and notified attending MD about patient's blood pressure and new order was written. Prn hydralazine was given for elevated blood pressure.

## 2014-05-06 NOTE — Progress Notes (Signed)
RN and IR PA have attempted to contact patients family numerous times this AM to obtain consent for aspiration of abscess. Unable to make contact at this time. Will continue to try to make contact.

## 2014-05-06 NOTE — Progress Notes (Signed)
SUBJECTIVE: Pt confused and agitated. Noted to drink 3 large glasses of vodka daily. HR 95-105 bpm with diltiazem drip increased to 15 mg/hr five minutes ago, but pt had not received 6 am dose of Ativan to this point. Blood cultures growing gram+ cocci in chains.     Intake/Output Summary (Last 24 hours) at 05/06/14 0842 Last data filed at 05/06/14 3762  Gross per 24 hour  Intake 1834.56 ml  Output   2075 ml  Net -240.44 ml    Current Facility-Administered Medications  Medication Dose Route Frequency Provider Last Rate Last Dose  . acetaminophen (TYLENOL) tablet 650 mg  650 mg Oral Q6H PRN Rise Patience, MD       Or  . acetaminophen (TYLENOL) suppository 650 mg  650 mg Rectal Q6H PRN Rise Patience, MD   650 mg at 05/06/14 0053  . antiseptic oral rinse (CPC / CETYLPYRIDINIUM CHLORIDE 0.05%) solution 7 mL  7 mL Mouth Rinse BID Verlee Monte, MD   7 mL at 05/05/14 2202  . diltiazem (CARDIZEM) 100 mg in dextrose 5 % 100 mL (1 mg/mL) infusion  5-15 mg/hr Intravenous Titrated Verlee Monte, MD 15 mL/hr at 05/06/14 0546 15 mg/hr at 05/06/14 0546  . febuxostat (ULORIC) tablet 80 mg  80 mg Oral Daily Rise Patience, MD   80 mg at 05/05/14 1229  . folic acid (FOLVITE) tablet 1 mg  1 mg Oral Daily Rise Patience, MD   1 mg at 05/05/14 0826  . hydrALAZINE (APRESOLINE) injection 10 mg  10 mg Intravenous Q6H PRN Verlee Monte, MD   10 mg at 05/06/14 0656  . insulin aspart (novoLOG) injection 0-9 Units  0-9 Units Subcutaneous Q4H Rise Patience, MD   2 Units at 05/06/14 [phone]  . LORazepam (ATIVAN) injection 0-4 mg  0-4 mg Intravenous Q6H Rise Patience, MD   2 mg at 05/06/14 1761   Followed by  . [START ON 05/07/2014] LORazepam (ATIVAN) injection 0-4 mg  0-4 mg Intravenous Q12H Rise Patience, MD      . LORazepam (ATIVAN) tablet 1 mg  1 mg Oral Q6H PRN Rise Patience, MD       Or  . LORazepam (ATIVAN) injection 1 mg  1 mg Intravenous Q6H PRN Rise Patience, MD   1 mg at 05/06/14 0523  . metoprolol (LOPRESSOR) injection 5 mg  5 mg Intravenous Once Verlee Monte, MD   5 mg at 05/06/14 0836  . morphine 2 MG/ML injection 1 mg  1 mg Intravenous Q4H PRN Rise Patience, MD   1 mg at 05/06/14 0430  . multivitamin with minerals tablet 1 tablet  1 tablet Oral Daily Rise Patience, MD   1 tablet at 05/05/14 [phone]  . ondansetron (ZOFRAN) tablet 4 mg  4 mg Oral Q6H PRN Rise Patience, MD       Or  . ondansetron [facility]) injection 4 mg  4 mg Intravenous Q6H PRN Rise Patience, MD      . piperacillin-tazobactam (ZOSYN) IVPB 3.375 g  3.375 g Intravenous Q8H Anh P Pham, RPH   3.375 g at 05/06/14 0641  . thiamine (VITAMIN B-1) tablet 100 mg  100 mg Oral Daily Rise Patience, MD   100 mg at 05/05/14 7106   Or  . thiamine (B-1) injection 100 mg  100 mg Intravenous Daily Rise Patience, MD      . vancomycin (VANCOCIN) IVPB 750  mg/150 ml premix  750 mg Intravenous Q12H Anh P Pham, RPH   750 mg at 05/06/14 0538    Filed Vitals:   05/06/14 0400 05/06/14 0500 05/06/14 0656 05/06/14 0800  BP:  180/72 196/72   Pulse:  98    Temp: 100.5 F (38.1 C)   98.5 F (36.9 C)  TempSrc: Oral   Oral  Resp:  41    Height:      Weight:      SpO2:  96%      PHYSICAL EXAM General: confused, agitated Neck: No JVD, no thyromegaly or thyroid nodule.  Lungs: Clear to auscultation bilaterally with normal respiratory effort. CV: Tachycardic, irregular rhythm, normal S1/S2, no S3, no murmur. No peripheral edema.  Abdomen: Soft, nontender, no distention.  Neurologic: Confused, agitated. Psych: Normal affect. Extremities: No clubbing or cyanosis.   TELEMETRY: Reviewed telemetry pt in atrial fibrillation, HR 95-105 bpm.  LABS: Basic Metabolic Panel:  Recent Labs  05/04/14 1500 05/05/14 0500  NA 133* 134*  K 3.6 3.6  CL 94* 99  CO2 22 24  GLUCOSE 146* 126*  BUN 38* 28*  CREATININE 0.78 0.83  CALCIUM 8.9 8.4   Liver  Function Tests:  Recent Labs  05/04/14 1500 05/05/14 0500  AST 152* 167*  ALT 92* 111*  ALKPHOS 111 120*  BILITOT 1.6* 1.8*  PROT 7.4 7.4  ALBUMIN 3.6 3.5   No results for input(s): LIPASE, AMYLASE in the last 72 hours. CBC:  Recent Labs  05/04/14 1500 05/05/14 0500  WBC 10.3 9.8  NEUTROABS 8.0* 7.2  HGB 13.0 13.6  HCT 37.7* 39.0  MCV 95.2 96.3  PLT 180 206   Cardiac Enzymes: No results for input(s): CKTOTAL, CKMB, CKMBINDEX, TROPONINI in the last 72 hours. BNP: Invalid input(s): POCBNP D-Dimer: No results for input(s): DDIMER in the last 72 hours. Hemoglobin A1C: No results for input(s): HGBA1C in the last 72 hours. Fasting Lipid Panel: No results for input(s): CHOL, HDL, LDLCALC, TRIG, CHOLHDL, LDLDIRECT in the last 72 hours. Thyroid Function Tests:  Recent Labs  05/05/14 1208  TSH 1.523   Anemia Panel: No results for input(s): VITAMINB12, FOLATE, FERRITIN, TIBC, IRON, RETICCTPCT in the last 72 hours.  RADIOLOGY: Ct Pelvis W Contrast  05/04/2014   CLINICAL DATA:  Right hip pain and fever for 3 days. History of total hip replacement 2009.  EXAM: CT PELVIS WITH CONTRAST  TECHNIQUE: Multidetector CT imaging of the pelvis was performed using the standard protocol following the bolus administration of intravenous contrast.  CONTRAST:  46mL OMNIPAQUE IOHEXOL 300 MG/ML  SOLN  COMPARISON:  05/04/2014 and MR 02/26/2010  FINDINGS: There is soft tissue density fullness with faint rim enhancement along the lower aspect of the right iliopsoas muscle. This measures 5.2 x 3.5 x 8.5 cm. Centrally this measures soft tissue density. Considerations include abscess, phlegmon, myositis, or other soft tissue mass. Further evaluation with MRI may be helpful. Patient has had previous right total hip arthroplasty. Prosthetic components appear well seated. There is no evidence for periprosthetic loosening. Degenerative changes are seen in the lower lumbar spine. There is dense  atherosclerosis of the abdominal aorta. There is significant sigmoid diverticulosis.  IMPRESSION: 1. Rim enhancing mass along the lower aspect of the iliopsoas muscle, suspicious for abscess or phlegmon. Myositis or other soft tissue mass is also a consideration. Further evaluation with MRI may be helpful. 2. Status post right total hip arthroplasty without adverse features. 3. Significant sigmoid diverticulosis.   Electronically Signed  By: Nolon Nations M.D.   On: 05/04/2014 20:39   Dg Chest Port 1 View  05/04/2014   CLINICAL DATA:  Fever.  Fatigue.  Altered mental status.  EXAM: PORTABLE CHEST - 1 VIEW  COMPARISON:  05/13/2010  FINDINGS: Heart size is within normal limits and stable allowing for portable technique. Atherosclerotic calcification of thoracic aorta again noted. Both lungs are clear. No evidence of pneumothorax or pleural effusion. Bilateral shoulder prostheses again noted.  IMPRESSION: Stable exam.  No active disease.   Electronically Signed   By: Earle Gell M.D.   On: 05/04/2014 16:25   Dg Knee Complete 4 Views Right  05/04/2014   CLINICAL DATA:  Fatigue and fever. Right lower extremity pain. Altered mental status.  EXAM: RIGHT KNEE - COMPLETE 4+ VIEW  COMPARISON:  None.  FINDINGS: Total knee prosthesis observed. No fracture or complicating feature identified. Polymer spacer remains in place.  Distal SFA and popliteal artery atherosclerotic calcification.  IMPRESSION: 1. Total knee prosthesis without complicating feature identified. 2. Atherosclerosis.   Electronically Signed   By: Van Clines M.D.   On: 05/04/2014 16:28   Dg Hip Unilat With Pelvis 2-3 Views Right  05/04/2014   CLINICAL DATA:  Right lateral hip pain. History of arthroplasty. Altered mental status. Denies fall. Fatigue.  EXAM: RIGHT HIP (WITH PELVIS) 2-3 VIEWS  COMPARISON:  05/13/2010  FINDINGS: Patient has had previous right hip arthroplasty. There is no evidence for loosening or subluxation. There is dense  atherosclerotic calcification of the femoral arteries. Degenerative changes are noted in the lower lumbar spine. No acute fracture.  IMPRESSION: No evidence for acute  abnormality.   Electronically Signed   By: Nolon Nations M.D.   On: 05/04/2014 16:27      ASSESSMENT AND PLAN: 1. New-onset rapid atrial fibrillation: Most likely triggered by iliopsoas abscess and bacteremia, but chronic alcoholism also likely contributing. TSH normal. Echocardiogram ordered and should be performed once HR is more optimally controlled to assess both LV function and LA volume. Once IR has completed abscess drainage and it has been deemed safe, and if atrial fibrillation is nonvalvular, would recommend anticoagulation with Xarelto 20 mg daily for feasibility. CHADSVASC is 4, thus thromboembolic risk is high. However, given chronic alcoholism, compliance may be an issue. IV diltiazem was initiated on 4/30. Current tachycardia likely related to agitation from combination of bacteremia and alcohol withdrawal (drinks 3 glasses of vodka daily). I asked nurse to give Ativan. Once his abscess is drained and he is adequately treated for infection, I would consider transitioning to oral long-acting diltiazem (rather than atenolol) if LV systolic function is normal.  2. Essential HTN: As stated previously, consider transitioning to oral long-acting diltiazem (rather than atenolol) if LV systolic function is normal which may help with BP control as well. Currently receiving prn hydralazine IV.  3. Iliopsoas absess: Currently on IV fluids and broad-spectrum antimicrobial therapy, with plans for abscess drainage.  4. Agitation/confusion: Likely related to both bacteremia and alcohol withdrawal.   Kate Sable, M.D., F.A.C.C.

## 2014-05-06 NOTE — Progress Notes (Signed)
Patient ID: [name], male   DOB: 04-12-1935, 79 y.o.   MRN: [id number]    Subjective: Patient responsive but more confused this morning and not communicative  Objective: Vital signs in last 24 hours: Temp:  [98.5 F (36.9 C)-101.6 F (38.7 C)] 98.5 F (36.9 C) (05/01 0800) Pulse Rate:  [86-144] 98 (05/01 0500) Resp:  [15-41] 41 (05/01 0500) BP: (112-196)/(50-150) 196/72 mmHg (05/01 0656) SpO2:  [94 %-99 %] 96 % (05/01 0500)    Intake/Output from previous day: 04/30 0701 - 05/01 0700 In: 2034.6 [I.V.:1634.6; IV Piggyback:400] Out: 2075 [Urine:2075] Intake/Output this shift:    General appearance: responds but confused and slightly agitated Resp: clear to auscultation bilaterally GI: soft without apparent tenderness  Extremities: No edema, well-perfused  Lab Results:   Recent Labs  05/04/14 1500 05/05/14 0500  WBC 10.3 9.8  HGB 13.0 13.6  HCT 37.7* 39.0  PLT 180 206   BMET  Recent Labs  05/04/14 1500 05/05/14 0500  NA 133* 134*  K 3.6 3.6  CL 94* 99  CO2 22 24  GLUCOSE 146* 126*  BUN 38* 28*  CREATININE 0.78 0.83  CALCIUM 8.9 8.4     Studies/Results: Ct Pelvis W Contrast  05/04/2014   CLINICAL DATA:  Right hip pain and fever for 3 days. History of total hip replacement 2009.  EXAM: CT PELVIS WITH CONTRAST  TECHNIQUE: Multidetector CT imaging of the pelvis was performed using the standard protocol following the bolus administration of intravenous contrast.  CONTRAST:  4mL OMNIPAQUE IOHEXOL 300 MG/ML  SOLN  COMPARISON:  05/04/2014 and MR 02/26/2010  FINDINGS: There is soft tissue density fullness with faint rim enhancement along the lower aspect of the right iliopsoas muscle. This measures 5.2 x 3.5 x 8.5 cm. Centrally this measures soft tissue density. Considerations include abscess, phlegmon, myositis, or other soft tissue mass. Further evaluation with MRI may be helpful. Patient has had previous right total hip arthroplasty. Prosthetic components appear  well seated. There is no evidence for periprosthetic loosening. Degenerative changes are seen in the lower lumbar spine. There is dense atherosclerosis of the abdominal aorta. There is significant sigmoid diverticulosis.  IMPRESSION: 1. Rim enhancing mass along the lower aspect of the iliopsoas muscle, suspicious for abscess or phlegmon. Myositis or other soft tissue mass is also a consideration. Further evaluation with MRI may be helpful. 2. Status post right total hip arthroplasty without adverse features. 3. Significant sigmoid diverticulosis.   Electronically Signed   By: Nolon Nations M.D.   On: 05/04/2014 20:39   Dg Chest Port 1 View  05/04/2014   CLINICAL DATA:  Fever.  Fatigue.  Altered mental status.  EXAM: PORTABLE CHEST - 1 VIEW  COMPARISON:  05/13/2010  FINDINGS: Heart size is within normal limits and stable allowing for portable technique. Atherosclerotic calcification of thoracic aorta again noted. Both lungs are clear. No evidence of pneumothorax or pleural effusion. Bilateral shoulder prostheses again noted.  IMPRESSION: Stable exam.  No active disease.   Electronically Signed   By: Earle Gell M.D.   On: 05/04/2014 16:25   Dg Knee Complete 4 Views Right  05/04/2014   CLINICAL DATA:  Fatigue and fever. Right lower extremity pain. Altered mental status.  EXAM: RIGHT KNEE - COMPLETE 4+ VIEW  COMPARISON:  None.  FINDINGS: Total knee prosthesis observed. No fracture or complicating feature identified. Polymer spacer remains in place.  Distal SFA and popliteal artery atherosclerotic calcification.  IMPRESSION: 1. Total knee prosthesis without complicating feature  identified. 2. Atherosclerosis.   Electronically Signed   By: Van Clines M.D.   On: 05/04/2014 16:28   Dg Hip Unilat With Pelvis 2-3 Views Right  05/04/2014   CLINICAL DATA:  Right lateral hip pain. History of arthroplasty. Altered mental status. Denies fall. Fatigue.  EXAM: RIGHT HIP (WITH PELVIS) 2-3 VIEWS  COMPARISON:   05/13/2010  FINDINGS: Patient has had previous right hip arthroplasty. There is no evidence for loosening or subluxation. There is dense atherosclerotic calcification of the femoral arteries. Degenerative changes are noted in the lower lumbar spine. No acute fracture.  IMPRESSION: No evidence for acute  abnormality.   Electronically Signed   By: Nolon Nations M.D.   On: 05/04/2014 16:27    Anti-infectives: Anti-infectives    Start     Dose/Rate Route Frequency Ordered Stop   05/05/14 0500  vancomycin (VANCOCIN) IVPB 750 mg/150 ml premix     750 mg 150 mL/hr over 60 Minutes Intravenous Every 12 hours 05/04/14 1642     05/04/14 2300  piperacillin-tazobactam (ZOSYN) IVPB 3.375 g     3.375 g 12.5 mL/hr over 240 Minutes Intravenous Every 8 hours 05/04/14 1642     05/04/14 1645  vancomycin (VANCOCIN) IVPB 1000 mg/200 mL premix     1,000 mg 200 mL/hr over 60 Minutes Intravenous NOW 05/04/14 1642 05/04/14 1900   05/04/14 1615  piperacillin-tazobactam (ZOSYN) IVPB 3.375 g     3.375 g 100 mL/hr over 30 Minutes Intravenous  Once 05/04/14 1603 05/04/14 1900   05/04/14 1615  vancomycin (VANCOCIN) IVPB 1000 mg/200 mL premix     1,000 mg 200 mL/hr over 60 Minutes Intravenous  Once 05/04/14 1603 05/04/14 1900      Assessment/Plan: Iliopsoas abscess. Needs percutaneous drainage. No evidence of GI involvement. Mental status is not as good today. This is near his right hip prosthesis and I would ask orthopedics at some point to evaluate this.    LOS: 2 days    Rikita Grabert T 05/06/2014

## 2014-05-06 NOTE — Progress Notes (Addendum)
Late entry: Pt assessment in question for concern. He is hard to arouse and have focus on conversation, lethargic, stuttering speech, and tachypnea Also, uncertainty about baseline of patient left eye. Pt does not follow commands. Disoriented x 4. Informed that patient was alert and oriented x 2 this morning. Floor coverage with Trh, Jerilynn Mages.Donnal Debar, NP was paged and asked to evaluate pt at bedside.

## 2014-05-07 ENCOUNTER — Inpatient Hospital Stay (HOSPITAL_COMMUNITY): Payer: Medicare Other

## 2014-05-07 ENCOUNTER — Other Ambulatory Visit (HOSPITAL_COMMUNITY): Payer: Medicare Other

## 2014-05-07 DIAGNOSIS — M25551 Pain in right hip: Secondary | ICD-10-CM

## 2014-05-07 LAB — BLOOD GAS, ARTERIAL
ACID-BASE EXCESS: 1.1 mmol/L (ref 0.0–2.0)
Bicarbonate: 21.7 mEq/L (ref 20.0–24.0)
DRAWN BY: 11249
O2 Content: 2 L/min
O2 Saturation: 95.6 %
PCO2 ART: 25.5 mmHg — AB (ref 35.0–45.0)
Patient temperature: 99.9
TCO2: 18.5 mmol/L (ref 0–100)
pH, Arterial: 7.543 — ABNORMAL HIGH (ref 7.350–7.450)
pO2, Arterial: 80.6 mmHg (ref 80.0–100.0)

## 2014-05-07 LAB — GLUCOSE, CAPILLARY
GLUCOSE-CAPILLARY: 165 mg/dL — AB (ref 70–99)
GLUCOSE-CAPILLARY: 187 mg/dL — AB (ref 70–99)
GLUCOSE-CAPILLARY: 176 mg/dL — AB (ref 70–99)
Glucose-Capillary: 138 mg/dL — ABNORMAL HIGH (ref 70–99)
Glucose-Capillary: 176 mg/dL — ABNORMAL HIGH (ref 70–99)
Glucose-Capillary: 202 mg/dL — ABNORMAL HIGH (ref 70–99)

## 2014-05-07 LAB — CREATININE, SERUM
Creatinine, Ser: 0.69 mg/dL (ref 0.61–1.24)
GFR calc Af Amer: >60 mL/min (ref >60)

## 2014-05-07 MED ORDER — LORAZEPAM 2 MG/ML IJ SOLN
1.0000 mg | INTRAMUSCULAR | Status: DC | PRN
  Administered 2014-05-07 – 2014-05-08 (×3): 1 mg via INTRAVENOUS
  Filled 2014-05-07 (×5): qty 1

## 2014-05-07 MED ORDER — IOHEXOL 300 MG/ML  SOLN: 25.0000 mL | INTRAMUSCULAR | Status: AC

## 2014-05-07 MED ORDER — METHOCARBAMOL 1000 MG/10ML IJ SOLN
500.0000 mg | Freq: Four times a day (QID) | INTRAMUSCULAR | Status: DC | PRN
  Filled 2014-05-07: qty 5

## 2014-05-07 MED ORDER — MORPHINE SULFATE 2 MG/ML IJ SOLN
2.0000 mg | INTRAMUSCULAR | Status: DC | PRN
  Administered 2014-05-07: 2 mg via INTRAVENOUS
  Filled 2014-05-07 (×2): qty 1

## 2014-05-07 MED ORDER — HALOPERIDOL LACTATE 5 MG/ML IJ SOLN
2.0000 mg | INTRAMUSCULAR | Status: DC
  Administered 2014-05-07 – 2014-05-08 (×7): 2 mg via INTRAVENOUS
  Filled 2014-05-07 (×6): qty 1

## 2014-05-07 MED ORDER — DEXTROSE-NACL 5-0.45 % IV SOLN
INTRAVENOUS | Status: DC
  Administered 2014-05-07 – 2014-05-08 (×4): via INTRAVENOUS
  Filled 2014-05-07 (×6): qty 1000

## 2014-05-07 NOTE — Progress Notes (Signed)
Subjective: No pain, but difficult to have pt concentrate on questions.  Mostly moaning.  Objective: Vital signs in last 24 hours: Temp:  [98.5 F (36.9 C)-100.3 F (37.9 C)] 98.5 F (36.9 C) (05/02 0802) Pulse Rate:  [79-116] 116 (05/02 0430) Resp:  [16-44] 38 (05/02 0100) BP: (105-208)/(60-133) 162/78 mmHg (05/02 0430) SpO2:  [94 %-99 %] 95 % (05/02 0100) Weight change:  Last BM Date: 05/06/14 Intake/Output from previous day: +365 05/01 0701 - 05/02 0700 In: 1865 [I.V.:1555; IV Piggyback:250] Out: 1500 [Urine:1500] Intake/Output this shift:    PE: General:Moaning or repeating syllables over and over, NAD Skin:Warm and dry, brisk capillary refill HEENT:normocephalic, sclera clear, mucus membranes moist Neck:supple, mild JVD  Heart:irreg irreg without murmur, gallup, rub or click Lungs:clear, ant,  without rales, rhonchi, or wheezes YQM:GNOI, non tender to mild tenderness difficult to tell with the moaning, + BS, do not palpate liver spleen or masses Ext:no lower ext edema,  2+ radial pulses Neuro:awake, not answering most questions , MAE, follows commands at times, + facial symmetry Tele: a fib with rates 90-108  occ PVCs, on IV dilt.   Lab Results:  Recent Labs  05/04/14 1500 05/05/14 0500  WBC 10.3 9.8  HGB 13.0 13.6  HCT 37.7* 39.0  PLT 180 206   BMET  Recent Labs  05/04/14 1500 05/05/14 0500  NA 133* 134*  K 3.6 3.6  CL 94* 99  CO2 22 24  GLUCOSE 146* 126*  BUN 38* 28*  CREATININE 0.78 0.83  CALCIUM 8.9 8.4   No results for input(s): TROPONINI in the last 72 hours.  Invalid input(s): CK, MB  No results found for: CHOL, HDL, LDLCALC, LDLDIRECT, TRIG, CHOLHDL No results found for: HGBA1C   Lab Results  Component Value Date   TSH 1.523 05/05/2014    Hepatic Function Panel  Recent Labs  05/05/14 0500  PROT 7.4  ALBUMIN 3.5  AST 167*  ALT 111*  ALKPHOS 120*  BILITOT 1.8*   No results for input(s): CHOL in the last 72  hours. No results for input(s): PROTIME in the last 72 hours.     Studies/Results: Ct Aspiration  05/06/2014   CLINICAL DATA:  79 year old male with a history of bacteremia and sepsis.  CT imaging demonstrates a heterogeneous collection within the right psoas musculature. He has been referred for potential drainage of a presumed abscess.  EXAM: CT GUIDED DRAINAGE OF RIGHT PSOAS ABSCESS  ANESTHESIA/SEDATION: Zero Mg IV Versed 0 mcg IV Fentanyl  Total Moderate Sedation Time:  0 minutes  PROCEDURE: The procedure, risks, benefits, and alternatives were explained to the patient's family. Questions regarding the procedure were encouraged and answered. The patient understands and consents to the procedure.  Patient is position in the supine position on the CT gantry table. CT scan of the pelvis was acquired.  The the right inguinal region was prepped with Betadinein a sterile fashion, and a sterile drape was applied covering the operative field. A sterile gown and sterile gloves were used for the procedure. Local anesthesia was provided with 1% Lidocaine.  Once the patient is prepped and draped sterilely, the skin and subcutaneous tissues overlying the right psoas were generously infiltrated with 1% lidocaine for local anesthesia. A small stab incision was made with 11 blade scalpel.  A trocar needle was advanced with CT guidance into the greatest diameter of the collection involving the right psoas. Once we confirmed the tip of the needle with imaging,  the stylet was removed and a wire was placed.  Serial dilation of the soft tissues was performed, and then a 10 [place] drain was placed into the collection.  Vigorous aspiration was attempted while manipulating the 10 French tube. No fluid was aspirated.  The patient tolerated the procedure well and remained hemodynamically stable throughout.  No complications were encountered and no blood loss was encounter.  A sterile dressing was placed.  COMPLICATIONS: None   FINDINGS: CT study of the pelvis demonstrates heterogeneously dense collection within the right psoas musculature, similar in size to the comparison CT. No significant peripheral stranding.  When placing the needle into the collection, the patient experienced some discomfort.  CT images demonstrate placement of pigtail catheter into the lateral collection, safely lateral to the femoral vasculature as well as the inguinal canal.  No fluid could be aspirated with vigorous aspiration and replacement of the tube. Upon withdrawal of the drain no fluid could be aspirated.  IMPRESSION: Status post attempted aspiration of dense fluid collection within the right psoas musculature. No fluid could be aspirated once the 10 French drain was placed into the fluid collection, suggesting non liquified hematoma at this time. No drain was left, as this could lead to potential seeding with bacteria of a sterile hematoma.  Signed,  Dulcy Fanny. Earleen Newport, DO  [facility]  [facility]   Electronically Signed   By: Corrie Mckusick D.O.   On: 05/06/2014 16:29   Dg Chest Port 1 View  05/06/2014   CLINICAL DATA:  Fever and worsening shortness of breath began today.  EXAM: PORTABLE CHEST - 1 VIEW  COMPARISON:  05/04/2014  FINDINGS: Patient motion degrades film quality. Patient has had prior bilateral shoulder arthroplasty. The heart is enlarged. Suspect developing infiltrate at the left lung base. Mild interstitial edema noted.  IMPRESSION: 1. Cardiomegaly and mild edema. 2. Suspect developing infiltrate in the left lower lobe.   Electronically Signed   By: Nolon Nations M.D.   On: 05/06/2014 17:31   ECHO: Study Conclusions  - Left ventricle: The cavity size was normal. Wall thickness was increased in a pattern of severe LVH. Systolic function was normal. The estimated ejection fraction was in the range of 60% to 65%. Wall motion was normal; there were no regional wall motion  abnormalities. The study is not technically sufficient to allow evaluation of LV diastolic function. - Aortic valve: Sclerosis without stenosis. There was trivial regurgitation. - Mitral valve: Mildly thickened leaflets . There was moderate regurgitation. - Left atrium: The atrium was at the upper limits of normal in size. - Right ventricle: The cavity size was moderately dilated. - Right atrium: The atrium was mildly dilated. - Tricuspid valve: There was moderate regurgitation. - Pulmonary arteries: PA peak pressure: 41 mm Hg (S). - Inferior vena cava: The vessel was dilated. The respirophasic diameter changes were blunted (< 50%), consistent with elevated central venous pressure.  Impressions:  - LVEF 60-65%, moderate LVH, aortic sclerosis with trivial AI, mild RAE, moderate MR and TR, RVSP 41 mmHg, dilated IVC.     MEDcations: I have reviewed the patient's current medications. Scheduled Meds: . antiseptic oral rinse  7 mL Mouth Rinse BID  . febuxostat  80 mg Oral Daily  . folic acid  1 mg Oral Daily  . haloperidol lactate  2 mg Intravenous 6 times per day  . insulin aspart  0-9 Units Subcutaneous Q4H  . metoprolol  5 mg Intravenous Once  . multivitamin  with minerals  1 tablet Oral Daily  . piperacillin-tazobactam (ZOSYN)  IV  3.375 g Intravenous Q8H  . thiamine  100 mg Oral Daily   Or  . thiamine  100 mg Intravenous Daily  . vancomycin  750 mg Intravenous Q12H   Continuous Infusions: . dextrose 5 % and 0.45% NaCl 1,000 mL with potassium chloride 20 mEq infusion 100 mL/hr at 05/07/14 0814  . diltiazem (CARDIZEM) infusion 5 mg/hr (05/07/14 0826)   PRN Meds:.acetaminophen **OR** acetaminophen, hydrALAZINE, LORazepam, methocarbamol (ROBAXIN)  IV, morphine injection, ondansetron **OR** ondansetron (ZOFRAN) IV  Assessment/Plan: 1. New-onset rapid atrial fibrillation: Most likely triggered by iliopsoas abscess and bacteremia, but chronic alcoholism also likely  contributing. TSH normal. Echocardiogram Left atrium: The atrium was at the upper limits of normal, LVEF 60-65%, moderate LVH, aortic sclerosis with trivial AI, mild RAE, moderate MR and TR, RVSP 41 mmHg, dilated IVC. Once IR has completed abscess drainage and it has been deemed safe, and if atrial fibrillation is nonvalvular, would recommend anticoagulation with Xarelto 20 mg daily for feasibility. CHADSVASC is 4, thus thromboembolic risk is high. However, given chronic alcoholism, compliance may be an issue.- difficult to decide with confusion  IV diltiazem was initiated on 4/30. Now on 5 mg /hr fairly rate controlled- likely related to agitation from combination of bacteremia and alcohol withdrawal (drinks 3 glasses of vodka daily).  On haldol.  Once his abscess is drained and he is adequately treated for infection, I would consider transitioning to oral long-acting diltiazem (rather than atenolol) if LV systolic function is normal.  2. Essential HTN: consider transitioning to oral long-acting diltiazem (rather than atenolol) if LV systolic function is normal which may help with BP control as well.   Current ranges from 162/78- 183/89 , hydralazine prn  3. Iliopsoas absess: Currently on IV fluids and broad-spectrum antimicrobial therapy, with plans for abscess drainage.  4. Agitation/confusion: Likely related to both bacteremia and alcohol withdrawal.   LOS: 3 days   Time spent with pt. : 15 minutes. [facility]  Nurse Practitioner Certified Pager [phone] or after 5pm and on weekends call [phone] 05/07/2014, 10:24 AM  History and all data above reviewed.  Patient examined.  I agree with the findings as above.  Not awake and when he has been he is not coherent at this point.  The patient exam reveals ION:[id number], Lungs:  Decreased breath sounds  ,  Abd: Positive bowel sounds, no rebound no guarding, Ext diffuse edema  .  All available labs, radiology testing, previous records reviewed.  Agree with documented assessment and plan. Atrial fib.  Rate is OK.  Not an anticoagulation candidate at this moment.   HTN:  Not taking POs.  He will likely need increased and schedule hydralazine.  Currently this is PRN.   [place]inte[place] Kyisha Fowle  11:55 AM  05/07/2014

## 2014-05-07 NOTE — Progress Notes (Signed)
Assignment resume by Probation officer from [name].  Pt does not appear in distress. Assessed to be more alert than yesterday, oriented x 1, person and restless. Tachypnea, oxygen saturation is WNL.Marland KitchenLiberty called nurse about patient condition. [facility] hospitalist informed as well. Will continue to manage pain and monitor patient.

## 2014-05-07 NOTE — Progress Notes (Signed)
Subjective: Pt with no acute changes overnight.  Some disorientation.  Objective: Vital signs in last 24 hours: Temp:  [98.5 F (36.9 C)-100.3 F (37.9 C)] 99.1 F (37.3 C) (05/02 0017) Pulse Rate:  [79-116] 116 (05/02 0430) Resp:  [16-44] 38 (05/02 0100) BP: (105-208)/(60-133) 162/78 mmHg (05/02 0430) SpO2:  [94 %-99 %] 95 % (05/02 0100) Last BM Date: 05/06/14  Intake/Output from previous day: 05/01 0701 - 05/02 0700 In: 1865 [I.V.:1555; IV Piggyback:250] Out: 1500 [Urine:1500] Intake/Output this shift:    General appearance: no distress GI: soft, nttp, no rebound/guarding  Lab Results:   Recent Labs  05/04/14 1500 05/05/14 0500  WBC 10.3 9.8  HGB 13.0 13.6  HCT 37.7* 39.0  PLT 180 206   BMET  Recent Labs  05/04/14 1500 05/05/14 0500  NA 133* 134*  K 3.6 3.6  CL 94* 99  CO2 22 24  GLUCOSE 146* 126*  BUN 38* 28*  CREATININE 0.78 0.83  CALCIUM 8.9 8.4   PT/INR  Recent Labs  05/05/14 0055  LABPROT 15.2  INR 1.18   ABG  Recent Labs  05/06/14 1641  PHART 7.551*  HCO3 21.4    Studies/Results: Ct Aspiration  05/06/2014   CLINICAL DATA:  79 year old male with a history of bacteremia and sepsis.  CT imaging demonstrates a heterogeneous collection within the right psoas musculature. He has been referred for potential drainage of a presumed abscess.  EXAM: CT GUIDED DRAINAGE OF RIGHT PSOAS ABSCESS  ANESTHESIA/SEDATION: Zero Mg IV Versed 0 mcg IV Fentanyl  Total Moderate Sedation Time:  0 minutes  PROCEDURE: The procedure, risks, benefits, and alternatives were explained to the patient's family. Questions regarding the procedure were encouraged and answered. The patient understands and consents to the procedure.  Patient is position in the supine position on the CT gantry table. CT scan of the pelvis was acquired.  The the right inguinal region was prepped with Betadinein a sterile fashion, and a sterile drape was applied covering the operative field. A  sterile gown and sterile gloves were used for the procedure. Local anesthesia was provided with 1% Lidocaine.  Once the patient is prepped and draped sterilely, the skin and subcutaneous tissues overlying the right psoas were generously infiltrated with 1% lidocaine for local anesthesia. A small stab incision was made with 11 blade scalpel.  A trocar needle was advanced with CT guidance into the greatest diameter of the collection involving the right psoas. Once we confirmed the tip of the needle with imaging, the stylet was removed and a wire was placed.  Serial dilation of the soft tissues was performed, and then a 10 [place] drain was placed into the collection.  Vigorous aspiration was attempted while manipulating the 10 French tube. No fluid was aspirated.  The patient tolerated the procedure well and remained hemodynamically stable throughout.  No complications were encountered and no blood loss was encounter.  A sterile dressing was placed.  COMPLICATIONS: None  FINDINGS: CT study of the pelvis demonstrates heterogeneously dense collection within the right psoas musculature, similar in size to the comparison CT. No significant peripheral stranding.  When placing the needle into the collection, the patient experienced some discomfort.  CT images demonstrate placement of pigtail catheter into the lateral collection, safely lateral to the femoral vasculature as well as the inguinal canal.  No fluid could be aspirated with vigorous aspiration and replacement of the tube. Upon withdrawal of the drain no fluid could be aspirated.  IMPRESSION: Status post attempted aspiration  of dense fluid collection within the right psoas musculature. No fluid could be aspirated once the 10 French drain was placed into the fluid collection, suggesting non liquified hematoma at this time. No drain was left, as this could lead to potential seeding with bacteria of a sterile hematoma.  Signed,  Dulcy Fanny. Earleen Newport, DO  [facility]  [facility]   Electronically Signed   By: Corrie Mckusick D.O.   On: 05/06/2014 16:29   Dg Chest Port 1 View  05/06/2014   CLINICAL DATA:  Fever and worsening shortness of breath began today.  EXAM: PORTABLE CHEST - 1 VIEW  COMPARISON:  05/04/2014  FINDINGS: Patient motion degrades film quality. Patient has had prior bilateral shoulder arthroplasty. The heart is enlarged. Suspect developing infiltrate at the left lung base. Mild interstitial edema noted.  IMPRESSION: 1. Cardiomegaly and mild edema. 2. Suspect developing infiltrate in the left lower lobe.   Electronically Signed   By: Nolon Nations M.D.   On: 05/06/2014 17:31    Anti-infectives: Anti-infectives    Start     Dose/Rate Route Frequency Ordered Stop   05/05/14 0500  vancomycin (VANCOCIN) IVPB 750 mg/150 ml premix     750 mg 150 mL/hr over 60 Minutes Intravenous Every 12 hours 05/04/14 1642     05/04/14 2300  piperacillin-tazobactam (ZOSYN) IVPB 3.375 g     3.375 g 12.5 mL/hr over 240 Minutes Intravenous Every 8 hours 05/04/14 1642     05/04/14 1645  vancomycin (VANCOCIN) IVPB 1000 mg/200 mL premix     1,000 mg 200 mL/hr over 60 Minutes Intravenous NOW 05/04/14 1642 05/04/14 1900   05/04/14 1615  piperacillin-tazobactam (ZOSYN) IVPB 3.375 g     3.375 g 100 mL/hr over 30 Minutes Intravenous  Once 05/04/14 1603 05/04/14 1900   05/04/14 1615  vancomycin (VANCOCIN) IVPB 1000 mg/200 mL premix     1,000 mg 200 mL/hr over 60 Minutes Intravenous  Once 05/04/14 1603 05/04/14 1900      Assessment/Plan: Iliopsoas hematoma vs abscess No sign of intraabdominal infection at this time.  Attempted IR drainage revealed likely hematoma NO acute surgical plans.    LOS: 3 days    Rosario Jacks., [facility] 05/07/2014

## 2014-05-07 NOTE — Care Management Note (Signed)
  Page 1 of 1   05/10/2014     12:57:14 PM CARE MANAGEMENT NOTE 05/10/2014  Patient:  [name], [name]   Account Number:  [id number]  Date Initiated:  05/07/2014  Documentation initiated by:  Contina Strain  Subjective/Objective Assessment:   agitiated/confusion/a.fib     Action/Plan:   tbd   Anticipated DC Date:  05/13/2014   Anticipated DC Plan:  HOME/SELF CARE  In-house referral  NA         Choice offered to / List presented to:             Status of service:  In process, will continue to follow Medicare Important Message given?   (If response is "NO", the following Medicare IM given date fields will be blank) Date Medicare IM given:   Medicare IM given by:   Date Additional Medicare IM given:   Additional Medicare IM given by:    Discharge Disposition:    Per UR Regulation:  Reviewed for med. necessity/level of care/duration of stay  If discussed at [place] of Stay Meetings, dates discussed:    Comments:  Date:  May 10, 2014 U.R. performed for needs and level of care. Will continue to follow for Case Management needs.  Velva Harman, RN, BSN, CCM   [phone] Remains on iv cardizem drip due to a.fib.  May 07, 2014/Jacki Couse L. Rosana Hoes, RN, BSN, CCM. Case Management [place] [phone] No discharge needs present of time of review.

## 2014-05-07 NOTE — Progress Notes (Signed)
K.Black, NP, with [facility] was asked to come to monitor patient behavior and condition for he is screaming and cursing aloud. Very agitated after having recieved Morphine, Ativan, and Haldol.

## 2014-05-07 NOTE — Progress Notes (Signed)
Spoke with [facility] r/t pt pain issues concerning his right hip.  New orders received and initiated.  Pt care report given to Carilyn Goodpasture, RN to continue to monitor and report.

## 2014-05-07 NOTE — Progress Notes (Signed)
ANTIBIOTIC CONSULT NOTE - FOLLOW UP  Pharmacy Consult for Vancomycin and Zosyn Indication: rule out sepsis, psoas abscess  Allergies  Allergen Reactions  . Allopurinol Other (See Comments)    Makes head 'feel funny'  . Motrin [Ibuprofen] Other (See Comments)    Blisters in groin area    Patient Measurements: Height: 5\' 7"  (170.2 cm) Weight: 223 lb 5.2 oz (101.3 kg) IBW/kg (Calculated) : 66.1  Vital Signs: Temp: 98.5 F (36.9 C) (05/02 0802) Temp Source: Oral (05/02 0802) BP: 162/78 mmHg (05/02 0430) Pulse Rate: 116 (05/02 0430) Intake/Output from previous day: 05/01 0701 - 05/02 0700 In: 1865 [I.V.:1555; IV Piggyback:250] Out: 1500 [Urine:1500] Intake/Output from this shift:    Labs:  Recent Labs  05/04/14 1500 05/05/14 0500  WBC 10.3 9.8  HGB 13.0 13.6  PLT 180 206  CREATININE 0.78 0.83   Estimated Creatinine Clearance: 84.5 mL/min (by C-G formula based on Cr of 0.83). No results for input(s): VANCOTROUGH, VANCOPEAK, VANCORANDOM, GENTTROUGH, GENTPEAK, GENTRANDOM, TOBRATROUGH, TOBRAPEAK, TOBRARND, AMIKACINPEAK, AMIKACINTROU, AMIKACIN in the last 72 hours.   Microbiology: Recent Results (from the past 720 hour(s))  Blood Culture (routine x 2)     Status: None (Preliminary result)   Collection Time: 05/04/14  3:00 PM  Result Value Ref Range Status   Specimen Description BLOOD LEFT WRIST  5 ML IN [facility] BOTTLE  Final   Special Requests NONE  Final   Culture   Final           BLOOD CULTURE RECEIVED NO GROWTH TO DATE CULTURE WILL BE HELD FOR 5 DAYS BEFORE ISSUING A FINAL NEGATIVE REPORT Performed at [facility]    Report Status PENDING  Incomplete  Blood Culture (routine x 2)     Status: None (Preliminary result)   Collection Time: 05/04/14  4:21 PM  Result Value Ref Range Status   Specimen Description BLOOD RIGHT ANTECUBITAL  Final   Special Requests BOTTLES DRAWN AEROBIC AND ANAEROBIC 5CC  Final   Culture   Final    GRAM POSITIVE COCCI IN  CHAINS Note: Gram Stain Report Called to,Read Back By and Verified With: CAROLYN OAKLEY 4.30.16 BY MANGR 503PM Performed at [facility]    Report Status PENDING  Incomplete  Urine culture     Status: None   Collection Time: 05/04/14  5:51 PM  Result Value Ref Range Status   Specimen Description URINE, CLEAN CATCH  Final   Special Requests NONE  Final   Colony Count   Final    7,000 COLONIES/ML Performed at [facility]    Culture   Final    INSIGNIFICANT GROWTH Performed at [facility]    Report Status 05/05/2014 FINAL  Final  MRSA PCR Screening     Status: None   Collection Time: 05/04/14 11:24 PM  Result Value Ref Range Status   MRSA by PCR NEGATIVE NEGATIVE Final    Comment:        The GeneXpert MRSA Assay (FDA approved for NASAL specimens only), is one component of a comprehensive MRSA colonization surveillance program. It is not intended to diagnose MRSA infection nor to guide or monitor treatment for MRSA infections.     Anti-infectives    Start     Dose/Rate Route Frequency Ordered Stop   05/05/14 0500  vancomycin (VANCOCIN) IVPB 750 mg/150 ml premix     750 mg 150 mL/hr over 60 Minutes Intravenous Every 12 hours 05/04/14 1642     05/04/14 2300  piperacillin-tazobactam (ZOSYN) IVPB 3.375 g     3.375 g 12.5 mL/hr over 240 Minutes Intravenous Every 8 hours 05/04/14 1642     05/04/14 1645  vancomycin (VANCOCIN) IVPB 1000 mg/200 mL premix     1,000 mg 200 mL/hr over 60 Minutes Intravenous NOW 05/04/14 1642 05/04/14 1900   05/04/14 1615  piperacillin-tazobactam (ZOSYN) IVPB 3.375 g     3.375 g 100 mL/hr over 30 Minutes Intravenous  Once 05/04/14 1603 05/04/14 1900   05/04/14 1615  vancomycin (VANCOCIN) IVPB 1000 mg/200 mL premix     1,000 mg 200 mL/hr over 60 Minutes Intravenous  Once 05/04/14 1603 05/04/14 1900      Assessment: 79 y.o M presents to the ED with c/o fatigue and "dark urine" for the past couple of days. LA elevated at  2.38.Started broad abx with vancomycin and zosyn for suspected sepsis, also concern for psoas abscess.  Today, 05/07/2014, day #4 vancomycin and zosyn. Temperature curve improving, WBC and SCr normal as of 4/30, 1 of 2 blood cultures growing gram positive cocci in chains.   Goal of Therapy:  Vancomycin trough level 15-20 mcg/ml  Zosyn dose appropriate for indication, renal function  Plan:   Continue vancomycin 750mg  IV q12h - check trough at 17:00 tonight  Continue Zosyn 3.375gm IV q8h (4hr extended infusions) Follow up renal function & cultures - narrow spectrum as appropriate  Peggyann Juba, PharmD, BCPS Pager: [phone] 05/07/2014,11:01 AM

## 2014-05-07 NOTE — Progress Notes (Signed)
[facility] PROGRESS NOTE   [name] NID:[id number] DOB: [birth year] DOA: 05/04/2014 PCP: Lujean Amel, MD  HPI/Subjective: Confused, agitated, does not follow commands. Was grimacing on abdominal palpation.  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 The treated with broad-spectrum antibiotics and IV fluid hydration. Blood cultures obtained, patient will have IR for psoas muscle draining. Per notes IR not able to obtain consent for the procedure.  Right psoas muscle abscess Patient placed on broad-spectrum antibiotics, blood cultures obtained. General surgery consulted, recommended interventional radiology for drain placement. IR tried drainage, this appears to be hematoma rather than an abscess.  Acute encephalopathy Patient is confused, acute delirium and agitated. This could be acute metabolic encephalopathy secondary to sepsis. We'll schedule Haldol every 4 hours for today and take him off of that in the morning.  New onset of atrial fibrillation A. fib with RVR, heart rate was 120-140, started on Cardizem drip. Cardiology consulted for further evaluation.  Diabetes mellitus type 2 Currently nothing by mouth but will be started on carb modified diet. Check hemoglobin A1c and sliding scale.  Alcoholism Placed on CIWA protocol if patient develops withdrawal symptoms. Spoke with his son-in-law, patient probably no drinking for the past 1 year. I'll take him off of the CIWA protocol and put him on  Ativan as needed for agitation.  Hypertension Elevated blood pressure is likely overestimated because of agitation and fighting nurses when taking blood pressure. Patient is on Cardizem drip, hydralazine as needed for high blood pressure.     Code Status: Full Code Family Communication: Plan discussed with the patient. Disposition Plan: Remains inpatient Diet: Diet NPO time specified  Consultants:  Gen. surgery.  IR  Procedures:  None, will have drain placement for the psoas abscess  Antibiotics:  Currently on vancomycin and Zosyn.   Objective: Filed Vitals:   05/07/14 0802  BP:   Pulse:   Temp: 98.5 F (36.9 C)  Resp:     Intake/Output Summary (Last 24 hours) at 05/07/14 0816 Last data filed at 05/07/14 0611  Gross per 24 hour  Intake   1850 ml  Output   1400 ml  Net    450 ml   Filed Weights   05/04/14 1617 05/04/14 2319  Weight: 102.059 kg (225 lb) 101.3 kg (223 lb 5.2 oz)    Exam: General: Alert and awake, oriented x3, not in any acute distress. HEENT: anicteric sclera, pupils reactive to light and accommodation, EOMI CVS: S1-S2 clear, no murmur rubs or gallops Chest: clear to auscultation bilaterally, no wheezing, rales or rhonchi Abdomen: soft nontender, nondistended, normal bowel sounds, no organomegaly Extremities: no cyanosis, clubbing or edema noted bilaterally Neuro: Cranial nerves II-XII intact, no focal neurological deficits  Data Reviewed: Basic Metabolic Panel:  Recent Labs Lab 05/04/14 1500 05/05/14 0500  NA 133* 134*  K 3.6 3.6  CL 94* 99  CO2 22 24  GLUCOSE 146* 126*  BUN 38* 28*  CREATININE 0.78 0.83  CALCIUM 8.9 8.4  Liver Function Tests:  Recent Labs Lab 05/04/14 1500 05/05/14 0500  AST 152* 167*  ALT 92* 111*  ALKPHOS 111 120*  BILITOT 1.6* 1.8*  PROT 7.4 7.4  ALBUMIN 3.6 3.5   No results for input(s): LIPASE, AMYLASE in the last 168 hours. No results for input(s): AMMONIA in the last 168  hours. CBC:  Recent Labs Lab 05/04/14 1500 05/05/14 0500  WBC 10.3 9.8  NEUTROABS 8.0* 7.2  HGB 13.0 13.6  HCT 37.7* 39.0  MCV 95.2 96.3  PLT 180 206   Cardiac Enzymes: No results for input(s): CKTOTAL, CKMB, CKMBINDEX, TROPONINI in the last 168 hours. BNP (last 3 results) No results for input(s): BNP in the last 8760 hours.  ProBNP (last 3 results) No results for input(s): PROBNP in the last 8760 hours.  CBG:  Recent Labs Lab 05/06/14 1744 05/06/14 2044 05/07/14 0024 05/07/14 0439 05/07/14 0800  GLUCAP 153* 166* 138* 165* 202*    Micro Recent Results (from the past 240 hour(s))  Blood Culture (routine x 2)     Status: None (Preliminary result)   Collection Time: 05/04/14  3:00 PM  Result Value Ref Range Status   Specimen Description BLOOD LEFT WRIST  5 ML IN [facility] BOTTLE  Final   Special Requests NONE  Final   Culture   Final           BLOOD CULTURE RECEIVED NO GROWTH TO DATE CULTURE WILL BE HELD FOR 5 DAYS BEFORE ISSUING A FINAL NEGATIVE REPORT Performed at [facility]    Report Status PENDING  Incomplete  Blood Culture (routine x 2)     Status: None (Preliminary result)   Collection Time: 05/04/14  4:21 PM  Result Value Ref Range Status   Specimen Description BLOOD RIGHT ANTECUBITAL  Final   Special Requests BOTTLES DRAWN AEROBIC AND ANAEROBIC 5CC  Final   Culture   Final    GRAM POSITIVE COCCI IN CHAINS Note: Gram Stain Report Called to,Read Back By and Verified With: CAROLYN OAKLEY 4.30.16 BY MANGR 62PM Performed at [facility]    Report Status PENDING  Incomplete  Urine culture     Status: None   Collection Time: 05/04/14  5:51 PM  Result Value Ref Range Status   Specimen Description URINE, CLEAN CATCH  Final   Special Requests NONE  Final   Colony Count   Final    7,000 COLONIES/ML Performed at [facility]    Culture   Final    INSIGNIFICANT GROWTH Performed at [facility]    Report Status 05/05/2014  FINAL  Final  MRSA PCR Screening     Status: None   Collection Time: 05/04/14 11:24 PM  Result Value Ref Range Status   MRSA by PCR NEGATIVE NEGATIVE Final    Comment:        The GeneXpert MRSA Assay (FDA approved for NASAL specimens only), is one component of a comprehensive MRSA colonization surveillance program. It is not intended to diagnose MRSA infection nor to guide or monitor treatment for MRSA infections.      Studies: Ct Aspiration  05/06/2014   CLINICAL DATA:  79 year old male with a history of bacteremia and sepsis.  CT imaging demonstrates a heterogeneous collection within the right psoas musculature. He has been referred for potential drainage of a presumed abscess.  EXAM: CT GUIDED DRAINAGE OF RIGHT PSOAS ABSCESS  ANESTHESIA/SEDATION: Zero Mg IV Versed 0 mcg IV Fentanyl  Total Moderate Sedation Time:  0 minutes  PROCEDURE: The procedure, risks, benefits, and alternatives were explained to the patient's family. Questions regarding the procedure were encouraged and answered. The patient understands and consents to the procedure.  Patient is position in the supine position on the CT gantry table. CT scan of the pelvis was acquired.  The the right inguinal region was prepped with Betadinein a sterile fashion, and a sterile drape was applied covering the operative field. A sterile gown and sterile gloves were used for the procedure. Local anesthesia was provided with 1% Lidocaine.  Once the patient is prepped and draped sterilely, the skin and subcutaneous tissues overlying the right psoas were generously infiltrated with 1% lidocaine for local anesthesia. A small stab incision was made with 11 blade scalpel.  A trocar needle was advanced with CT guidance into the greatest diameter of the collection involving the right psoas. Once we confirmed the tip of the needle with imaging, the stylet was removed and a wire was placed.  Serial dilation of the soft tissues was performed, and then a  10 [place] drain was placed into the collection.  Vigorous aspiration was attempted while manipulating the 10 French tube. No fluid was aspirated.  The patient tolerated the procedure well and remained hemodynamically stable throughout.  No complications were encountered and no blood loss was encounter.  A sterile dressing was placed.  COMPLICATIONS: None  FINDINGS: CT study of the pelvis demonstrates heterogeneously dense collection within the right psoas musculature, similar in size to the comparison CT. No significant peripheral stranding.  When placing the needle into the collection, the patient experienced some discomfort.  CT images demonstrate placement of pigtail catheter into the lateral collection, safely lateral to the femoral vasculature as well as the inguinal canal.  No fluid could be aspirated with vigorous aspiration and replacement of the tube. Upon withdrawal of the drain no fluid could be aspirated.  IMPRESSION: Status post attempted aspiration of dense fluid collection within the right psoas musculature. No fluid could be aspirated once the 10 French drain was placed into the fluid collection, suggesting non liquified hematoma at this time. No drain was left, as this could lead to potential seeding with bacteria of a sterile hematoma.  Signed,  Dulcy Fanny. Earleen Newport, DO  [facility]  [facility]   Electronically Signed   By: Corrie Mckusick D.O.   On: 05/06/2014 16:29   Dg Chest Port 1 View  05/06/2014   CLINICAL DATA:  Fever and worsening shortness of breath began today.  EXAM: PORTABLE CHEST - 1 VIEW  COMPARISON:  05/04/2014  FINDINGS: Patient motion degrades film quality. Patient has had prior bilateral shoulder arthroplasty. The heart is enlarged. Suspect developing infiltrate at the left lung base. Mild interstitial edema noted.  IMPRESSION: 1. Cardiomegaly and mild edema. 2. Suspect developing infiltrate in the left lower lobe.   Electronically Signed    By: Nolon Nations M.D.   On: 05/06/2014 17:31    Scheduled Meds: . antiseptic oral rinse  7 mL Mouth Rinse BID  . febuxostat  80 mg Oral Daily  . folic acid  1 mg Oral Daily  . haloperidol lactate  2 mg Intravenous 6 times per day  . insulin aspart  0-9 Units Subcutaneous Q4H  . iohexol  25 mL Oral Q1 Hr x 2  . LORazepam  0-4 mg Intravenous Q12H  . metoprolol  5 mg Intravenous Once  . multivitamin with minerals  1 tablet Oral Daily  . piperacillin-tazobactam (ZOSYN)  IV  3.375 g Intravenous Q8H  . thiamine  100 mg Oral Daily   Or  . thiamine  100 mg Intravenous Daily  . vancomycin  750 mg Intravenous Q12H   Continuous Infusions: . dextrose 5 % and 0.45% NaCl 1,000 mL with potassium chloride 20 mEq infusion 100 mL/hr at 05/07/14 0814  . diltiazem (CARDIZEM) infusion 5 mg/hr (05/06/14 1703)       Time spent: 35 minutes    [facility] A  [facility] Pager [phone] If 7PM-7AM, please contact night-coverage at [email or web address], password [facility] 05/07/2014, 8:16 AM  LOS: 3 days

## 2014-05-07 NOTE — Procedures (Signed)
CT guided aspiration of R psoas returned 60ml bloody purulent material, sent for GS, C&S No complication No blood loss. See complete dictation in [facility].

## 2014-05-08 ENCOUNTER — Inpatient Hospital Stay (HOSPITAL_COMMUNITY): Payer: Medicare Other

## 2014-05-08 LAB — BASIC METABOLIC PANEL
Anion gap: 5 (ref 5–15)
Anion gap: 9 (ref 5–15)
BUN: 14 mg/dL (ref 6–20)
BUN: 13 mg/dL (ref 6–20)
CHLORIDE: 110 mmol/L (ref 101–111)
CO2: 22 mmol/L (ref 22–32)
CO2: 24 mmol/L (ref 22–32)
Calcium: 7.7 mg/dL — ABNORMAL LOW (ref 8.9–10.3)
Calcium: 8.1 mg/dL — ABNORMAL LOW (ref 8.9–10.3)
Chloride: 108 mmol/L (ref 101–111)
Creatinine, Ser: 0.73 mg/dL (ref 0.61–1.24)
Creatinine, Ser: 0.73 mg/dL (ref 0.61–1.24)
GFR calc Af Amer: >60 mL/min (ref >60)
GFR calc non Af Amer: >60 mL/min (ref >60)
GFR calc non Af Amer: >60 mL/min (ref >60)
GLUCOSE: 171 mg/dL — AB (ref 70–99)
Glucose, Bld: 153 mg/dL — ABNORMAL HIGH (ref 70–99)
POTASSIUM: 2.6 mmol/L — AB (ref 3.5–5.1)
POTASSIUM: 2.4 mmol/L — AB (ref 3.5–5.1)
SODIUM: 141 mmol/L (ref 135–145)
Sodium: 137 mmol/L (ref 135–145)

## 2014-05-08 LAB — CBC
HCT: 38.6 % — ABNORMAL LOW (ref 39.0–52.0)
Hemoglobin: 13 g/dL (ref 13.0–17.0)
MCH: 32 pg (ref 26.0–34.0)
MCHC: 33.7 g/dL (ref 30.0–36.0)
MCV: 95.1 fL (ref 78.0–100.0)
Platelets: 239 10*3/uL (ref 150–400)
RBC: 4.06 MIL/uL — ABNORMAL LOW (ref 4.22–5.81)
RDW: 13.9 % (ref 11.5–15.5)
WBC: 14.4 10*3/uL — AB (ref 4.0–10.5)

## 2014-05-08 LAB — CULTURE, BLOOD (ROUTINE X 2)

## 2014-05-08 LAB — GLUCOSE, CAPILLARY
GLUCOSE-CAPILLARY: 163 mg/dL — AB (ref 70–99)
GLUCOSE-CAPILLARY: 175 mg/dL — AB (ref 70–99)
Glucose-Capillary: 162 mg/dL — ABNORMAL HIGH (ref 70–99)
Glucose-Capillary: 184 mg/dL — ABNORMAL HIGH (ref 70–99)
Glucose-Capillary: 168 mg/dL — ABNORMAL HIGH (ref 70–99)
Glucose-Capillary: 132 mg/dL — ABNORMAL HIGH (ref 70–99)

## 2014-05-08 LAB — VANCOMYCIN, TROUGH: Vancomycin Tr: 8 ug/mL — ABNORMAL LOW (ref 10.0–20.0)

## 2014-05-08 MED ORDER — POTASSIUM CHLORIDE 10 MEQ/100ML IV SOLN
10.0000 meq | INTRAVENOUS | Status: AC
  Administered 2014-05-08 (×3): 10 meq via INTRAVENOUS
  Filled 2014-05-08 (×3): qty 100

## 2014-05-08 MED ORDER — VANCOMYCIN HCL 10 G IV SOLR
1250.0000 mg | Freq: Two times a day (BID) | INTRAVENOUS | Status: DC
  Administered 2014-05-08 – 2014-05-11 (×6): 1250 mg via INTRAVENOUS
  Filled 2014-05-08 (×7): qty 1250

## 2014-05-08 MED ORDER — MORPHINE SULFATE 2 MG/ML IJ SOLN
2.0000 mg | Freq: Once | INTRAMUSCULAR | Status: AC
  Administered 2014-05-08: 2 mg via INTRAVENOUS

## 2014-05-08 MED ORDER — LORAZEPAM 2 MG/ML IJ SOLN
1.0000 mg | Freq: Once | INTRAMUSCULAR | Status: AC
  Administered 2014-05-08: 1 mg via INTRAVENOUS

## 2014-05-08 MED ORDER — MORPHINE SULFATE 2 MG/ML IJ SOLN
INTRAMUSCULAR | Status: AC
  Filled 2014-05-08: qty 1

## 2014-05-08 MED ORDER — HYDRALAZINE HCL 20 MG/ML IJ SOLN
20.0000 mg | INTRAMUSCULAR | Status: DC
  Administered 2014-05-08 – 2014-05-11 (×18): 20 mg via INTRAVENOUS
  Filled 2014-05-08 (×18): qty 1

## 2014-05-08 MED ORDER — HALOPERIDOL LACTATE 5 MG/ML IJ SOLN
2.0000 mg | INTRAMUSCULAR | Status: DC | PRN
  Administered 2014-05-15 – 2014-05-23 (×7): 2 mg via INTRAVENOUS
  Filled 2014-05-08 (×8): qty 1

## 2014-05-08 MED ORDER — METOPROLOL TARTRATE 1 MG/ML IV SOLN
5.0000 mg | Freq: Once | INTRAVENOUS | Status: AC
  Administered 2014-05-08: 5 mg via INTRAVENOUS

## 2014-05-08 MED ORDER — FUROSEMIDE 10 MG/ML IJ SOLN
40.0000 mg | Freq: Once | INTRAMUSCULAR | Status: AC
  Administered 2014-05-08: 40 mg via INTRAVENOUS
  Filled 2014-05-08: qty 4

## 2014-05-08 MED ORDER — HYDROMORPHONE HCL 1 MG/ML IJ SOLN: 0.5000 mg | INTRAMUSCULAR | Status: DC | PRN

## 2014-05-08 MED ORDER — POTASSIUM CHLORIDE 10 MEQ/100ML IV SOLN
INTRAVENOUS | Status: AC
  Filled 2014-05-08: qty 100

## 2014-05-08 MED ORDER — POTASSIUM CHLORIDE 10 MEQ/100ML IV SOLN
10.0000 meq | INTRAVENOUS | Status: AC
  Administered 2014-05-08 (×5): 10 meq via INTRAVENOUS
  Filled 2014-05-08: qty 100

## 2014-05-08 NOTE — Progress Notes (Signed)
Pt HR now in  A.fib with RVR. Cardizem titrated to 15mg /hr. Pt is now having increased WOB lasting for longer periods of time. RR as high as 59 bpm. Pt having periods of apnic breathing intermixed  With the increased WOB. breath sounds auscultated and become more diminished during apnea. During these periods of apnea pt desaturates to 85% oxygen saturation. Pt placed on 2LNC. Pt pupils reactive. MD made aware no further orders at this time.

## 2014-05-08 NOTE — Consult Note (Signed)
Reason for Consult:History of right hip pain status post right total hip replacement Referring Physician: Gen. surgery  [name] is an 79 y.o. male.  HPI: The patient is a long past right total hip replacement many years ago.  Admitted with the diagnosis of right hip pain.  I have not been asked to see him until today.  He has been treated for an iliopsoas abscess with to attempt to drain placement which ultimately did get some 5 cc of fluid out of the psoas . Muscle.  We'll consult in for concerns of the right hip to see if there's any possibility that this infection could and expanded into the hip.The patient is unable to give any significant history at this point.   Past Medical History  Diagnosis Date  . Hypertension   . Arthritis     Past Surgical History  Procedure Laterality Date  . Hip arthroplasty Right   . Joint replacement      right hip, bilateral knees  . Right cataract extraction      Family History  Problem Relation Age of Onset  . Diabetes Mellitus II Daughter     Social History:  reports that he has quit smoking. His smoking use included Cigarettes. He smoked 25.00 packs per day. He has never used smokeless tobacco. He reports that he drinks alcohol. He reports that he does not use illicit drugs.  Allergies:  Allergies  Allergen Reactions  . Allopurinol Other (See Comments)    Makes head 'feel funny'  . Motrin [Ibuprofen] Other (See Comments)    Blisters in groin area    Medications: I have reviewed the patient's current medications.  Results for orders placed or performed during the hospital encounter of 05/04/14 (from the past 48 hour(s))  Glucose, capillary     Status: Abnormal   Collection Time: 05/06/14  8:44 PM  Result Value Ref Range   Glucose-Capillary 166 (H) 70 - 99 mg/dL  Glucose, capillary     Status: Abnormal   Collection Time: 05/07/14 12:24 AM  Result Value Ref Range   Glucose-Capillary 138 (H) 70 - 99 mg/dL  Glucose, capillary      Status: Abnormal   Collection Time: 05/07/14  4:39 AM  Result Value Ref Range   Glucose-Capillary 165 (H) 70 - 99 mg/dL  Glucose, capillary     Status: Abnormal   Collection Time: 05/07/14  8:00 AM  Result Value Ref Range   Glucose-Capillary 202 (H) 70 - 99 mg/dL   Comment 1 Notify RN   Glucose, capillary     Status: Abnormal   Collection Time: 05/07/14 11:42 AM  Result Value Ref Range   Glucose-Capillary 187 (H) 70 - 99 mg/dL   Comment 1 Notify RN   Culture, routine-abscess     Status: None (Preliminary result)   Collection Time: 05/07/14  2:29 PM  Result Value Ref Range   Specimen Description ABSCESS    Special Requests Normal    Gram Stain      ABUNDANT WBC PRESENT,BOTH PMN AND MONONUCLEAR NO SQUAMOUS EPITHELIAL CELLS SEEN NO ORGANISMS SEEN Performed at [facility]    Culture NO GROWTH Performed at [facility]     Report Status PENDING   Glucose, capillary     Status: Abnormal   Collection Time: 05/07/14  4:56 PM  Result Value Ref Range   Glucose-Capillary 176 (H) 70 - 99 mg/dL   Comment 1 Notify RN   Vancomycin, trough  Status: Abnormal   Collection Time: 05/07/14  5:25 PM  Result Value Ref Range   Vancomycin Tr 8 (L) 10.0 - 20.0 ug/mL    Comment: Performed at [facility]  Creatinine, serum     Status: None   Collection Time: 05/07/14  5:25 PM  Result Value Ref Range   Creatinine, Ser 0.69 0.61 - 1.24 mg/dL   GFR calc non Af Amer >60 >60 mL/min   GFR calc Af Amer >60 >60 mL/min    Comment: (NOTE) The eGFR has been calculated using the CKD EPI equation. This calculation has not been validated in all clinical situations. eGFR's persistently <90 mL/min signify possible Chronic Kidney Disease.   Glucose, capillary     Status: Abnormal   Collection Time: 05/07/14  8:05 PM  Result Value Ref Range   Glucose-Capillary 176 (H) 70 - 99 mg/dL  Blood gas, arterial     Status: Abnormal   Collection Time: 05/07/14 11:59 PM  Result Value Ref  Range   O2 Content 2.0 L/min   Delivery systems NASAL CANNULA    pH, Arterial 7.543 (H) 7.350 - 7.450   pCO2 arterial 25.5 (L) 35.0 - 45.0 mmHg   pO2, Arterial 80.6 80.0 - 100.0 mmHg   Bicarbonate 21.7 20.0 - 24.0 mEq/L   TCO2 18.5 0 - 100 mmol/L   Acid-Base Excess 1.1 0.0 - 2.0 mmol/L   O2 Saturation 95.6 %   Patient temperature 99.9    Collection site RIGHT RADIAL    Drawn by [phone]    Sample type ARTERIAL DRAW    Allens test (pass/fail) PASS PASS  Glucose, capillary     Status: Abnormal   Collection Time: 05/08/14 12:46 AM  Result Value Ref Range   Glucose-Capillary 175 (H) 70 - 99 mg/dL  Basic metabolic panel     Status: Abnormal   Collection Time: 05/08/14  3:40 AM  Result Value Ref Range   Sodium 137 135 - 145 mmol/L   Potassium 2.6 (LL) 3.5 - 5.1 mmol/L    Comment: CRITICAL RESULT CALLED TO, READ BACK BY AND VERIFIED WITH: C CREECH RN [id number] 05/08/14 A NAVARRP    Chloride 110 101 - 111 mmol/L   CO2 22 22 - 32 mmol/L   Glucose, Bld 171 (H) 70 - 99 mg/dL   BUN 14 6 - 20 mg/dL   Creatinine, Ser 0.73 0.61 - 1.24 mg/dL   Calcium 7.7 (L) 8.9 - 10.3 mg/dL   GFR calc non Af Amer >60 >60 mL/min   GFR calc Af Amer >60 >60 mL/min    Comment: (NOTE) The eGFR has been calculated using the CKD EPI equation. This calculation has not been validated in all clinical situations. eGFR's persistently <90 mL/min signify possible Chronic Kidney Disease.    Anion gap 5 5 - 15  CBC     Status: Abnormal   Collection Time: 05/08/14  3:40 AM  Result Value Ref Range   WBC 14.4 (H) 4.0 - 10.5 K/uL   RBC 4.06 (L) 4.22 - 5.81 MIL/uL   Hemoglobin 13.0 13.0 - 17.0 g/dL   HCT 38.6 (L) 39.0 - 52.0 %   MCV 95.1 78.0 - 100.0 fL   MCH 32.0 26.0 - 34.0 pg   MCHC 33.7 30.0 - 36.0 g/dL   RDW 13.9 11.5 - 15.5 %   Platelets 239 150 - 400 K/uL  Glucose, capillary     Status: Abnormal   Collection Time: 05/08/14  4:02 AM  Result Value  Ref Range   Glucose-Capillary 162 (H) 70 - 99 mg/dL  Glucose,  capillary     Status: Abnormal   Collection Time: 05/08/14  7:54 AM  Result Value Ref Range   Glucose-Capillary 184 (H) 70 - 99 mg/dL  Glucose, capillary     Status: Abnormal   Collection Time: 05/08/14  1:22 PM  Result Value Ref Range   Glucose-Capillary 168 (H) 70 - 99 mg/dL  Glucose, capillary     Status: Abnormal   Collection Time: 05/08/14  4:01 PM  Result Value Ref Range   Glucose-Capillary 163 (H) 70 - 99 mg/dL    Ct Aspiration  05/07/2014   CLINICAL DATA:  Right hip and pelvic pain, fever. Right psoas process on recent CT. Recent aspiration was nondiagnostic.  EXAM: CT GUIDED NEEDLE ASPIRATE BIOPSY OF RIGHT PSOAS PROCESS  PROCEDURE: The procedure risks, benefits, and alternatives were explained to the family. Questions regarding the procedure were encouraged and answered. The family understands and consents to the procedure.  Select axial scans through the lower pelvis were obtained and the collection was localized. An appropriate skin entry site was determined and marked.  The operative field was prepped with Betadinein a sterile fashion, and a sterile drape was applied covering the operative field. A sterile gown and sterile gloves were used for the procedure. Local anesthesia was provided with 1% Lidocaine.  Under CT fluoroscopic guidance, an 18 gauge trocar needle was advanced into the lesion. Once needle tip position was confirmed, aspiration returned only 5 mL of bloody purulent material. The patient tolerated the procedure well.  COMPLICATIONS: None immediate  FINDINGS: Asymmetric enlargement right psoas musculature. Aspiration of the right psoas process returned 5 mL of bloody purulent material. This was sent for Gram stain, culture and sensitivity.  IMPRESSION: Aspiration of right psoas process returns 5 mL bloody purulent material, sent for Gram stain, culture and sensitivity.   Electronically Signed   By: Lucrezia Europe M.D.   On: 05/07/2014 16:40   Dg Chest Port 1 View  05/08/2014    CLINICAL DATA:  Dyspnea.  Unresponsive.  EXAM: PORTABLE CHEST - 1 VIEW  COMPARISON:  05/06/2014  FINDINGS: There is moderate unchanged cardiomegaly. No airspace consolidation is evident. There is no large effusion.  IMPRESSION: Unchanged cardiomegaly. No airspace consolidation or large effusion.   Electronically Signed   By: Andreas Newport M.D.   On: 05/08/2014 05:52    ROS  I reviewed the patient's review of systems thoroughly and her no positive responses as relates to the history of present illness.   Blood pressure 180/85, pulse 126, temperature 100.5 F (38.1 C), temperature source Rectal, resp. rate 45, height 5' 7" (1.702 m), weight 223 lb 5.2 oz (101.3 kg), SpO2 95 %. Physical Exam Well-developed well-nourished patient in no acute distress. Alert and oriented x3 HEENT:within normal limits Cardiac: Regular rate and rhythm Pulmonary: Lungs clear to auscultation Abdomen: Soft and nontender.  Normal active bowel sounds  Musculoskeletal: (The patient can give no significant and put to physical exam.  I don't get a sense that is hurting with range of motion of the right hip.)  Assessment/Plan: Complex situation that the patient does have a right total hip replacement.  He was admitted with right hip pain but I am unable to get any significant history from the patient.  He did have a right so as abscess and so is concerning that this could of track bound the psoas tendon into the right hip.  He has  an ASR-type hip replacement which is known to have some significant fluid collections around the Hip.  If they're concerned the patient has sepsis it may be reasonable to consider aspiration of the right hip.  If he in fact had an infection in the right hip he would need to have the parts removed.  This would be a relatively big operation.  I'm hopeful that the patient will respond to whatever treatments are being given to him for encephalopathy and we will be able to get a better history.  I will  fall the patient in the hospital and try to make some determination about what is the best treatment option.  Certainly without ability to know where the patient was hurting when he came in it's hard to know whether we should aspirate the hip.  He has had treatment for [place] abscess and so he may be getting better and so it may be best to just wait and see how he does with continued treatment.  Some of this will be up to the treatment team and some of this will be up to trying to the side with the patient if he does become more alert.  , L 05/08/2014, 6:41 PM

## 2014-05-08 NOTE — Progress Notes (Signed)
[facility] PROGRESS NOTE   SHAMAL STRACENER HDQ:[id number] DOB: 1935/08/12 DOA: 05/04/2014 PCP: Lujean Amel, MD  HPI/Subjective: Confused, was on schedule Haldol for yesterday. Confirmed with his son-in-law, no recent alcohol abuse. IR able to drain purulent bloody fluid from the right iliopsoas muscle yesterday.  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 The treated with broad-spectrum antibiotics and IV fluid hydration. Blood cultures obtained, purulent bloody discharge from the right iliopsoas obtained on 5/2. Await cultures, continue current broad-spectrum antibiotics. Spiked fever while on vancomycin and Zosyn, will obtain ID consult to adjust antibiotics.  Right psoas muscle abscess Patient placed on broad-spectrum antibiotics, blood cultures obtained. General surgery consulted, recommended interventional radiology for drain placement. IR were successful on second attempt to drove purulent fluids, await cultures.  Acute encephalopathy Patient is confused, acute delirium and agitated. This could be acute metabolic encephalopathy secondary to sepsis. Was on a schedule Haldol, will do it as needed today.  New onset of atrial fibrillation A. fib with RVR, heart rate was 120-140, started on Cardizem drip. Cardiology consulted for further evaluation.  Diabetes mellitus type 2 Currently nothing by mouth but will be started on carb modified diet. Check hemoglobin A1c and sliding scale.  Alcoholism Placed  on CIWA protocol if patient develops withdrawal symptoms. Spoke with his son-in-law, patient probably no drinking for the past 1 year. I'll take him off of the CIWA protocol and put him on Ativan as needed for agitation.  Hypertension Elevated blood pressure is likely overestimated because of agitation and fighting nurses when taking blood pressure. Patient is on Cardizem drip, hydralazine as needed for high blood pressure.     Code Status: Full Code Family Communication: Plan discussed with the patient. Disposition Plan: Remains inpatient Diet: Diet NPO time specified  Consultants:  Gen. surgery.  IR  Cards  Procedures:  Right iliopsoas abscess drained on 5/2  Antibiotics:  Currently on vancomycin and Zosyn.   Objective: Filed Vitals:   05/08/14 0816  BP: 174/75  Pulse:   Temp:   Resp:     Intake/Output Summary (Last 24 hours) at 05/08/14 0817 Last data filed at 05/08/14 1194  Gross per 24 hour  Intake   2895 ml  Output   1000 ml  Net   1895 ml   Filed Weights   05/04/14 1617 05/04/14 2319  Weight: 102.059 kg (225 lb) 101.3 kg (223 lb 5.2 oz)    Exam: General: Alert and awake, oriented x3, not in any acute distress. HEENT: anicteric sclera, pupils reactive to light and accommodation, EOMI CVS: S1-S2 clear, no murmur rubs or gallops Chest: clear to auscultation bilaterally, no wheezing, rales or rhonchi Abdomen: soft nontender, nondistended, normal bowel sounds, no organomegaly Extremities: no cyanosis, clubbing or edema noted bilaterally Neuro: Cranial nerves II-XII intact, no focal neurological deficits  Data Reviewed: Basic Metabolic Panel:  Recent Labs Lab 05/04/14 1500 05/05/14 0500 05/07/14 1725 05/08/14 0340  NA 133* 134*  --  137  K 3.6 3.6  --  2.6*  CL 94* 99  --  110  CO2 22 24  --  22  GLUCOSE 146* 126*  --  171*  BUN 38* 28*  --  14  CREATININE 0.78 0.83 0.69 0.73  CALCIUM 8.9 8.4  --  7.7*   Liver Function Tests:  Recent  Labs Lab 05/04/14 1500 05/05/14 0500  AST 152* 167*  ALT 92* 111*  ALKPHOS 111 120*  BILITOT 1.6* 1.8*  PROT 7.4 7.4  ALBUMIN 3.6 3.5   No results for input(s): LIPASE, AMYLASE in the last 168 hours. No results for input(s): AMMONIA in the last 168 hours. CBC:  Recent Labs Lab 05/04/14 1500 05/05/14 0500 05/08/14 0340  WBC 10.3 9.8 14.4*  NEUTROABS 8.0* 7.2  --   HGB 13.0 13.6 13.0  HCT 37.7* 39.0 38.6*  MCV 95.2 96.3 95.1  PLT 180 206 239   Cardiac Enzymes: No results for input(s): CKTOTAL, CKMB, CKMBINDEX, TROPONINI in the last 168 hours. BNP (last 3 results) No results for input(s): BNP in the last 8760 hours.  ProBNP (last 3 results) No results for input(s): PROBNP in the last 8760 hours.  CBG:  Recent Labs Lab 05/07/14 1656 05/07/14 2005 05/08/14 0046 05/08/14 0402 05/08/14 0754  GLUCAP 176* 176* 175* 162* 184*    Micro Recent Results (from the past 240 hour(s))  Blood Culture (routine x 2)     Status: None (Preliminary result)   Collection Time: 05/04/14  3:00 PM  Result Value Ref Range Status   Specimen Description BLOOD LEFT WRIST  5 ML IN [facility] BOTTLE  Final   Special Requests NONE  Final   Culture   Final           BLOOD CULTURE RECEIVED NO GROWTH TO DATE CULTURE WILL BE HELD FOR 5 DAYS BEFORE ISSUING A FINAL NEGATIVE REPORT Performed at [facility]    Report Status PENDING  Incomplete  Blood Culture (routine x 2)     Status: None (Preliminary result)   Collection Time: 05/04/14  4:21 PM  Result Value Ref Range Status   Specimen Description BLOOD RIGHT ANTECUBITAL  Final   Special Requests BOTTLES DRAWN AEROBIC AND ANAEROBIC 5CC  Final   Culture   Final    STREPTOCOCCUS SPECIES Note: Gram Stain Report Called to,Read Back By and Verified With: CAROLYN OAKLEY 4.30.16 BY MANGR 503PM Performed at [facility]    Report Status PENDING  Incomplete  Urine culture     Status: None   Collection Time: 05/04/14  5:51 PM  Result  Value Ref Range Status   Specimen Description URINE, CLEAN CATCH  Final   Special Requests NONE  Final   Colony Count   Final    7,000 COLONIES/ML Performed at [facility]    Culture   Final    INSIGNIFICANT GROWTH Performed at [facility]    Report Status 05/05/2014 FINAL  Final  MRSA PCR Screening     Status: None   Collection Time: 05/04/14 11:24 PM  Result Value Ref Range Status   MRSA by PCR NEGATIVE NEGATIVE Final    Comment:        The GeneXpert MRSA Assay (FDA approved for NASAL specimens only), is one component of a comprehensive MRSA colonization surveillance program. It is not intended to diagnose MRSA infection nor to guide or monitor treatment for MRSA infections.   Culture, routine-abscess     Status: None (Preliminary result)   Collection Time: 05/07/14  2:29 PM  Result Value Ref Range Status   Specimen Description ABSCESS  Final   Special Requests Normal  Final   Gram Stain   Final    ABUNDANT WBC PRESENT,BOTH PMN AND MONONUCLEAR NO SQUAMOUS EPITHELIAL CELLS SEEN NO ORGANISMS SEEN Performed at [facility]    Culture NO GROWTH Performed at [facility]   Final   Report Status PENDING  Incomplete     Studies: Ct Aspiration  05/07/2014   CLINICAL DATA:  Right hip and pelvic pain, fever. Right psoas process on recent CT. Recent aspiration was nondiagnostic.  EXAM: CT GUIDED NEEDLE ASPIRATE BIOPSY OF RIGHT PSOAS PROCESS  PROCEDURE: The procedure risks, benefits, and alternatives were explained to the family. Questions regarding the procedure were encouraged and answered. The family understands and consents to the procedure.  Select axial scans through the lower pelvis were obtained and the collection was localized. An appropriate skin entry site was determined and marked.  The operative field was prepped with Betadinein a sterile fashion, and a sterile drape was applied covering the operative field. A sterile gown and sterile  gloves were used for the procedure. Local anesthesia was provided with 1% Lidocaine.  Under CT fluoroscopic guidance, an 18 gauge trocar needle was advanced into the lesion. Once needle tip position was confirmed, aspiration returned only 5 mL of bloody purulent material. The patient tolerated the procedure well.  COMPLICATIONS: None immediate  FINDINGS: Asymmetric enlargement right psoas musculature. Aspiration of the right psoas process returned 5 mL of bloody purulent material. This was sent for Gram stain, culture and sensitivity.  IMPRESSION: Aspiration of right psoas process returns 5 mL bloody purulent material, sent for Gram stain, culture and sensitivity.   Electronically Signed   By: Lucrezia Europe M.D.   On: 05/07/2014 16:40   Ct Aspiration  05/06/2014   CLINICAL DATA:  79 year old male with a history of bacteremia and sepsis.  CT imaging demonstrates a heterogeneous collection within the right psoas musculature. He has been referred for potential drainage of a presumed abscess.  EXAM: CT GUIDED DRAINAGE OF RIGHT PSOAS ABSCESS  ANESTHESIA/SEDATION: Zero Mg IV Versed 0 mcg IV Fentanyl  Total Moderate Sedation Time:  0 minutes  PROCEDURE: The procedure, risks, benefits, and alternatives were explained to the patient's family. Questions regarding the procedure were encouraged and answered. The patient understands and consents to the procedure.  Patient is position in the supine position on the CT gantry table. CT scan of the pelvis was acquired.  The the right inguinal region was prepped with Betadinein a sterile fashion, and a sterile drape was applied covering the operative field. A sterile gown and sterile gloves were used for the procedure. Local anesthesia was provided with 1% Lidocaine.  Once the patient is prepped and draped sterilely, the skin and subcutaneous tissues overlying the right psoas were generously infiltrated with 1% lidocaine for local anesthesia. A small stab incision was made with 11  blade scalpel.  A trocar needle was advanced with CT guidance into the greatest diameter of the collection involving the right psoas. Once we confirmed the tip of the needle with imaging, the stylet was removed and a wire was placed.  Serial dilation of the soft tissues was performed, and then a 10 [place] drain was placed into the collection.  Vigorous aspiration was attempted while manipulating the 10 French tube. No fluid was aspirated.  The patient tolerated the procedure well and remained hemodynamically stable throughout.  No complications were  encountered and no blood loss was encounter.  A sterile dressing was placed.  COMPLICATIONS: None  FINDINGS: CT study of the pelvis demonstrates heterogeneously dense collection within the right psoas musculature, similar in size to the comparison CT. No significant peripheral stranding.  When placing the needle into the collection, the patient experienced some discomfort.  CT images demonstrate placement of pigtail catheter into the lateral collection, safely lateral to the femoral vasculature as well as the inguinal canal.  No fluid could be aspirated with vigorous aspiration and replacement of the tube. Upon withdrawal of the drain no fluid could be aspirated.  IMPRESSION: Status post attempted aspiration of dense fluid collection within the right psoas musculature. No fluid could be aspirated once the 10 French drain was placed into the fluid collection, suggesting non liquified hematoma at this time. No drain was left, as this could lead to potential seeding with bacteria of a sterile hematoma.  Signed,  Dulcy Fanny. Earleen Newport, DO  [facility]  [facility]   Electronically Signed   By: Corrie Mckusick D.O.   On: 05/06/2014 16:29   Dg Chest Port 1 View  05/08/2014   CLINICAL DATA:  Dyspnea.  Unresponsive.  EXAM: PORTABLE CHEST - 1 VIEW  COMPARISON:  05/06/2014  FINDINGS: There is moderate unchanged cardiomegaly. No airspace  consolidation is evident. There is no large effusion.  IMPRESSION: Unchanged cardiomegaly. No airspace consolidation or large effusion.   Electronically Signed   By: Andreas Newport M.D.   On: 05/08/2014 05:52   Dg Chest Port 1 View  05/06/2014   CLINICAL DATA:  Fever and worsening shortness of breath began today.  EXAM: PORTABLE CHEST - 1 VIEW  COMPARISON:  05/04/2014  FINDINGS: Patient motion degrades film quality. Patient has had prior bilateral shoulder arthroplasty. The heart is enlarged. Suspect developing infiltrate at the left lung base. Mild interstitial edema noted.  IMPRESSION: 1. Cardiomegaly and mild edema. 2. Suspect developing infiltrate in the left lower lobe.   Electronically Signed   By: Nolon Nations M.D.   On: 05/06/2014 17:31    Scheduled Meds: . antiseptic oral rinse  7 mL Mouth Rinse BID  . febuxostat  80 mg Oral Daily  . folic acid  1 mg Oral Daily  . haloperidol lactate  2 mg Intravenous 6 times per day  . insulin aspart  0-9 Units Subcutaneous Q4H  . metoprolol  5 mg Intravenous Once  . multivitamin with minerals  1 tablet Oral Daily  . piperacillin-tazobactam (ZOSYN)  IV  3.375 g Intravenous Q8H  . potassium chloride  10 mEq Intravenous Q1 Hr x 3  . thiamine  100 mg Oral Daily   Or  . thiamine  100 mg Intravenous Daily  . vancomycin  1,250 mg Intravenous Q12H   Continuous Infusions: . dextrose 5 % and 0.45% NaCl 1,000 mL with potassium chloride 20 mEq infusion 50 mL/hr at 05/08/14 0510  . diltiazem (CARDIZEM) infusion 5 mg/hr (05/07/14 1340)       Time spent: 35 minutes    [facility]  [facility] Pager [phone] If 7PM-7AM, please contact night-coverage at [email or web address], password [facility] 05/08/2014, 8:17 AM  LOS: 4 days

## 2014-05-08 NOTE — Progress Notes (Signed)
Pt has had increased WOB, with new onset wheezing. Pt no longer calling out but still extremely agitated. NP to beside. Order for 40 mg of Lasix and stat CXray

## 2014-05-08 NOTE — Progress Notes (Signed)
Date:  May 08, 2014 Update in condition from chart: New-onset rapid atrial fibrillation: IV diltiazem was initiated on 4/30. Now on 10 mg /hr fairly rate controlled Will continue to follow for Case Management needs. Suanne Marker Nghia Mcentee,RN,BSN,CCM  [phone]

## 2014-05-08 NOTE — Progress Notes (Signed)
Notified of patient persistent and worsening dyspnea. Level of agitation somewhat improved.   ABG earlier with ph 7.5 Co2 25.5 Po2 80.6  HR 125 BP 196/125 respers 40  Volume status +2.3L  PE Cv: tachycardic but regular, no murmur +JVD Resp, rapid somewhat shallow decreased breath sounds, some end expirartory wheezing, prolonged expiratory phase, using abdominal accessory muscles Abdomen: soft sluggish BS  Lethargic attempts to follow commands   Will obtain chest xray concern for volume  Overload Portable chest xray assess for edema   Consider solumedrol and nebs.    Dyanne Carrel, NP

## 2014-05-08 NOTE — Progress Notes (Signed)
ANTIBIOTIC CONSULT NOTE - FOLLOW UP  Pharmacy Consult for Vancomycin and Zosyn Indication: rule out sepsis, psoas abscess  Allergies  Allergen Reactions  . Allopurinol Other (See Comments)    Makes head 'feel funny'  . Motrin [Ibuprofen] Other (See Comments)    Blisters in groin area    Patient Measurements: Height: 5\' 7"  (170.2 cm) Weight: 223 lb 5.2 oz (101.3 kg) IBW/kg (Calculated) : 66.1  Vital Signs: Temp: 100 F (37.8 C) (05/03 0400) Temp Source: Axillary (05/03 0400) BP: 196/145 mmHg (05/03 0600) Pulse Rate: 119 (05/03 0600) Intake/Output from previous day: 05/02 0701 - 05/03 0700 In: 3246 [I.V.:2696; IV Piggyback:550] Out: 1000 [Urine:1000] Intake/Output from this shift: Total I/O In: 1425.8 [I.V.:1113.3; IV Piggyback:312.5] Out: 850 [Urine:850]  Labs:  Recent Labs  05/07/14 1725 05/08/14 0340  WBC  --  14.4*  HGB  --  13.0  PLT  --  239  CREATININE 0.69 0.73   Estimated Creatinine Clearance: 87.7 mL/min (by C-G formula based on Cr of 0.73).  Recent Labs  05/07/14 1725  [place] 8*     Microbiology: Recent Results (from the past 720 hour(s))  Blood Culture (routine x 2)     Status: None (Preliminary result)   Collection Time: 05/04/14  3:00 PM  Result Value Ref Range Status   Specimen Description BLOOD LEFT WRIST  5 ML IN [facility] BOTTLE  Final   Special Requests NONE  Final   Culture   Final           BLOOD CULTURE RECEIVED NO GROWTH TO DATE CULTURE WILL BE HELD FOR 5 DAYS BEFORE ISSUING A FINAL NEGATIVE REPORT Performed at [facility]    Report Status PENDING  Incomplete  Blood Culture (routine x 2)     Status: None (Preliminary result)   Collection Time: 05/04/14  4:21 PM  Result Value Ref Range Status   Specimen Description BLOOD RIGHT ANTECUBITAL  Final   Special Requests BOTTLES DRAWN AEROBIC AND ANAEROBIC 5CC  Final   Culture   Final    STREPTOCOCCUS SPECIES Note: Gram Stain Report Called to,Read Back By and Verified With:  CAROLYN OAKLEY 4.30.16 BY MANGR 503PM Performed at [facility]    Report Status PENDING  Incomplete  Urine culture     Status: None   Collection Time: 05/04/14  5:51 PM  Result Value Ref Range Status   Specimen Description URINE, CLEAN CATCH  Final   Special Requests NONE  Final   Colony Count   Final    7,000 COLONIES/ML Performed at [facility]    Culture   Final    INSIGNIFICANT GROWTH Performed at [facility]    Report Status 05/05/2014 FINAL  Final  MRSA PCR Screening     Status: None   Collection Time: 05/04/14 11:24 PM  Result Value Ref Range Status   MRSA by PCR NEGATIVE NEGATIVE Final    Comment:        The GeneXpert MRSA Assay (FDA approved for NASAL specimens only), is one component of a comprehensive MRSA colonization surveillance program. It is not intended to diagnose MRSA infection nor to guide or monitor treatment for MRSA infections.     Anti-infectives    Start     Dose/Rate Route Frequency Ordered Stop   05/08/14 1600  vancomycin (VANCOCIN) 1,250 mg in sodium chloride 0.9 % 250 mL IVPB     1,250 mg 166.7 mL/hr over 90 Minutes Intravenous Every 12 hours 05/08/14 0629  05/05/14 0500  vancomycin (VANCOCIN) IVPB 750 mg/150 ml premix  Status:  Discontinued     750 mg 150 mL/hr over 60 Minutes Intravenous Every 12 hours 05/04/14 1642 05/08/14 0629   05/04/14 2300  piperacillin-tazobactam (ZOSYN) IVPB 3.375 g     3.375 g 12.5 mL/hr over 240 Minutes Intravenous Every 8 hours 05/04/14 1642     05/04/14 1645  vancomycin (VANCOCIN) IVPB 1000 mg/200 mL premix     1,000 mg 200 mL/hr over 60 Minutes Intravenous NOW 05/04/14 1642 05/04/14 1900   05/04/14 1615  piperacillin-tazobactam (ZOSYN) IVPB 3.375 g     3.375 g 100 mL/hr over 30 Minutes Intravenous  Once 05/04/14 1603 05/04/14 1900   05/04/14 1615  vancomycin (VANCOCIN) IVPB 1000 mg/200 mL premix     1,000 mg 200 mL/hr over 60 Minutes Intravenous  Once 05/04/14 1603 05/04/14  1900      Assessment: 79 y.o M presents to the ED with c/o fatigue and "dark urine" for the past couple of days. LA elevated at 2.38.Started broad abx with vancomycin and zosyn for suspected sepsis, also concern for psoas abscess.  05/07/14 day #4 vancomycin and zosyn. Temperature curve improving, WBC and SCr normal as of 4/30, 1 of 2 blood cultures growing gram positive cocci in chains.  Today, 05/08/14  1700 VT=8, Scr=stable (0.68>0.73) Goal of Therapy:  Vancomycin trough level 15-20 mcg/ml  Zosyn dose appropriate for indication, renal function  Plan:   Increase Vancomycin to 1250 mg IV q12h  Continue Zosyn 3.375gm IV q8h (4hr extended infusions)       Follow up renal function & cultures - narrow spectrum as appropriate  Lawana Pai R 05/08/2014,6:31 AM

## 2014-05-08 NOTE — Progress Notes (Signed)
Initial Nutrition Assessment  DOCUMENTATION CODES:  Obesity unspecified  INTERVENTION:  Once diet advanced, add Glucerna Shake po BID, each supplement provides 220 kcal and 10 grams of protein  Diet advancement per MD  RD will continue to monitor  NUTRITION DIAGNOSIS:  Inadequate oral intake related to inability to eat as evidenced by NPO status.  GOAL:  Patient will meet greater than or equal to 90% of their needs  MONITOR:  PO intake, Diet advancement, Labs  REASON FOR ASSESSMENT:  Low Braden    ASSESSMENT: 79 y.o. male with history of diabetes mellitus type 2, chronic alcoholism, hypertension, gout presents to the ER because of worsening pain in the right hip and right lower quadrant.  - Pt s/p CT guided aspiration of left psoas hematoma 5/2 - Ongoing encephalopathy, pt currently NPO. Nutritional history obtained from RN and pt medical chart.  - No weight history.  - RD will monitor for diet advancement and addition of nutritional supplements.   Labs and medications reviewed  CBGs 162-184  K 2.6  Ca 7.7  Height:  Ht Readings from Last 1 Encounters:  05/04/14 5\' 7"  (1.702 m)    Weight:  Wt Readings from Last 1 Encounters:  05/04/14 223 lb 5.2 oz (101.3 kg)    Ideal Body Weight:  61.4 kg  Wt Readings from Last 10 Encounters:  05/04/14 223 lb 5.2 oz (101.3 kg)    BMI:  Body mass index is 34.97 kg/(m^2).  Estimated Nutritional Needs:  Kcal:  1850-2000  Protein:  120-130 g  Fluid:  2.0 L/day  Skin:     Diet Order:  Diet NPO time specified  EDUCATION NEEDS:  Education needs no appropriate at this time   Intake/Output Summary (Last 24 hours) at 05/08/14 1529 Last data filed at 05/08/14 1511  Gross per 24 hour  Intake 3090.83 ml  Output   3500 ml  Net -409.17 ml    Last BM:  [place], [place], [place]

## 2014-05-08 NOTE — Progress Notes (Signed)
CRITICAL VALUE ALERT  Critical value received: 2.4  Date of notification: 05/08/14  Time of notification:  1886  Critical value read back: yes   Nurse who received alert:  Park Breed RN  MD notified (1st page):  M. Elmahi  Time of first page: 1854  MD notified (2nd page):  Time of second page:  Responding MD:  Lajean Manes  Time MD responded:  [phone]

## 2014-05-08 NOTE — Progress Notes (Signed)
Subjective: IR aspiration of purulence yesterday from iliopsoas abscess.   Pt con't encephalopathic  Objective: Vital signs in last 24 hours: Temp:  [97.8 F (36.6 C)-100 F (37.8 C)] 98 F (36.7 C) (05/03 0800) Pulse Rate:  [43-180] 113 (05/03 0800) Resp:  [21-51] 41 (05/03 0800) BP: (112-196)/(54-145) 174/75 mmHg (05/03 0816) SpO2:  [93 %-97 %] 95 % (05/03 0800) Last BM Date: 05/07/14  Intake/Output from previous day: 05/02 0701 - 05/03 0700 In: 3346 [I.V.:2696; IV Piggyback:650] Out: 1000 [Urine:1000] Intake/Output this shift:    General appearance: alert and cooperative GI: soft, non-tender; bowel sounds normal; no masses,  no organomegaly  Lab Results:   Recent Labs  05/08/14 0340  WBC 14.4*  HGB 13.0  HCT 38.6*  PLT 239   BMET  Recent Labs  05/07/14 1725 05/08/14 0340  NA  --  137  K  --  2.6*  CL  --  110  CO2  --  22  GLUCOSE  --  171*  BUN  --  14  CREATININE 0.69 0.73  CALCIUM  --  7.7*   PT/INR No results for input(s): LABPROT, INR in the last 72 hours. ABG  Recent Labs  05/06/14 1641 05/07/14 2359  PHART 7.551* 7.543*  HCO3 21.4 21.7    Studies/Results: Ct Aspiration  05/07/2014   CLINICAL DATA:  Right hip and pelvic pain, fever. Right psoas process on recent CT. Recent aspiration was nondiagnostic.  EXAM: CT GUIDED NEEDLE ASPIRATE BIOPSY OF RIGHT PSOAS PROCESS  PROCEDURE: The procedure risks, benefits, and alternatives were explained to the family. Questions regarding the procedure were encouraged and answered. The family understands and consents to the procedure.  Select axial scans through the lower pelvis were obtained and the collection was localized. An appropriate skin entry site was determined and marked.  The operative field was prepped with Betadinein a sterile fashion, and a sterile drape was applied covering the operative field. A sterile gown and sterile gloves were used for the procedure. Local anesthesia was provided with  1% Lidocaine.  Under CT fluoroscopic guidance, an 18 gauge trocar needle was advanced into the lesion. Once needle tip position was confirmed, aspiration returned only 5 mL of bloody purulent material. The patient tolerated the procedure well.  COMPLICATIONS: None immediate  FINDINGS: Asymmetric enlargement right psoas musculature. Aspiration of the right psoas process returned 5 mL of bloody purulent material. This was sent for Gram stain, culture and sensitivity.  IMPRESSION: Aspiration of right psoas process returns 5 mL bloody purulent material, sent for Gram stain, culture and sensitivity.   Electronically Signed   By: Lucrezia Europe M.D.   On: 05/07/2014 16:40   Ct Aspiration  05/06/2014   CLINICAL DATA:  79 year old male with a history of bacteremia and sepsis.  CT imaging demonstrates a heterogeneous collection within the right psoas musculature. He has been referred for potential drainage of a presumed abscess.  EXAM: CT GUIDED DRAINAGE OF RIGHT PSOAS ABSCESS  ANESTHESIA/SEDATION: Zero Mg IV Versed 0 mcg IV Fentanyl  Total Moderate Sedation Time:  0 minutes  PROCEDURE: The procedure, risks, benefits, and alternatives were explained to the patient's family. Questions regarding the procedure were encouraged and answered. The patient understands and consents to the procedure.  Patient is position in the supine position on the CT gantry table. CT scan of the pelvis was acquired.  The the right inguinal region was prepped with Betadinein a sterile fashion, and a sterile drape was applied covering the operative field.  A sterile gown and sterile gloves were used for the procedure. Local anesthesia was provided with 1% Lidocaine.  Once the patient is prepped and draped sterilely, the skin and subcutaneous tissues overlying the right psoas were generously infiltrated with 1% lidocaine for local anesthesia. A small stab incision was made with 11 blade scalpel.  A trocar needle was advanced with CT guidance into the  greatest diameter of the collection involving the right psoas. Once we confirmed the tip of the needle with imaging, the stylet was removed and a wire was placed.  Serial dilation of the soft tissues was performed, and then a 10 [place] drain was placed into the collection.  Vigorous aspiration was attempted while manipulating the 10 French tube. No fluid was aspirated.  The patient tolerated the procedure well and remained hemodynamically stable throughout.  No complications were encountered and no blood loss was encounter.  A sterile dressing was placed.  COMPLICATIONS: None  FINDINGS: CT study of the pelvis demonstrates heterogeneously dense collection within the right psoas musculature, similar in size to the comparison CT. No significant peripheral stranding.  When placing the needle into the collection, the patient experienced some discomfort.  CT images demonstrate placement of pigtail catheter into the lateral collection, safely lateral to the femoral vasculature as well as the inguinal canal.  No fluid could be aspirated with vigorous aspiration and replacement of the tube. Upon withdrawal of the drain no fluid could be aspirated.  IMPRESSION: Status post attempted aspiration of dense fluid collection within the right psoas musculature. No fluid could be aspirated once the 10 French drain was placed into the fluid collection, suggesting non liquified hematoma at this time. No drain was left, as this could lead to potential seeding with bacteria of a sterile hematoma.  Signed,  Dulcy Fanny. Earleen Newport, DO  [facility]  [facility]   Electronically Signed   By: Corrie Mckusick D.O.   On: 05/06/2014 16:29   Dg Chest Port 1 View  05/08/2014   CLINICAL DATA:  Dyspnea.  Unresponsive.  EXAM: PORTABLE CHEST - 1 VIEW  COMPARISON:  05/06/2014  FINDINGS: There is moderate unchanged cardiomegaly. No airspace consolidation is evident. There is no large effusion.  IMPRESSION:  Unchanged cardiomegaly. No airspace consolidation or large effusion.   Electronically Signed   By: Andreas Newport M.D.   On: 05/08/2014 05:52   Dg Chest Port 1 View  05/06/2014   CLINICAL DATA:  Fever and worsening shortness of breath began today.  EXAM: PORTABLE CHEST - 1 VIEW  COMPARISON:  05/04/2014  FINDINGS: Patient motion degrades film quality. Patient has had prior bilateral shoulder arthroplasty. The heart is enlarged. Suspect developing infiltrate at the left lung base. Mild interstitial edema noted.  IMPRESSION: 1. Cardiomegaly and mild edema. 2. Suspect developing infiltrate in the left lower lobe.   Electronically Signed   By: Nolon Nations M.D.   On: 05/06/2014 17:31    Anti-infectives: Anti-infectives    Start     Dose/Rate Route Frequency Ordered Stop   05/08/14 1600  vancomycin (VANCOCIN) 1,250 mg in sodium chloride 0.9 % 250 mL IVPB     1,250 mg 166.7 mL/hr over 90 Minutes Intravenous Every 12 hours 05/08/14 0629     05/05/14 0500  vancomycin (VANCOCIN) IVPB 750 mg/150 ml premix  Status:  Discontinued     750 mg 150 mL/hr over 60 Minutes Intravenous Every 12 hours 05/04/14 1642 05/08/14 0629   05/04/14 2300  piperacillin-tazobactam (ZOSYN)  IVPB 3.375 g     3.375 g 12.5 mL/hr over 240 Minutes Intravenous Every 8 hours 05/04/14 1642     05/04/14 1645  vancomycin (VANCOCIN) IVPB 1000 mg/200 mL premix     1,000 mg 200 mL/hr over 60 Minutes Intravenous NOW 05/04/14 1642 05/04/14 1900   05/04/14 1615  piperacillin-tazobactam (ZOSYN) IVPB 3.375 g     3.375 g 100 mL/hr over 30 Minutes Intravenous  Once 05/04/14 1603 05/04/14 1900   05/04/14 1615  vancomycin (VANCOCIN) IVPB 1000 mg/200 mL premix     1,000 mg 200 mL/hr over 60 Minutes Intravenous  Once 05/04/14 1603 05/04/14 1900      Assessment/Plan: Small Iliopsoas abscess Encephalopathy Cx pending from iliopsoas abscess. Doubt this is highly contributing to his medical current medical condition.  Con't abx Medical w/u  for encephalopathy.  Will touch base with ortho to eval if joint at risk of infection   LOS: 4 days    Rosario Jacks., [facility] 05/08/2014

## 2014-05-08 NOTE — Progress Notes (Signed)
Pt. Work of breathing has been continually increasing. Respiratory rate has been between 35-45 breaths per minute consistently. Pt face is reddened and pt is unable to follow commands. Pt is continually agitated although prn ativan and scheduled ativan have been given. MD notified and order for ABG given. Will continue to monitor.

## 2014-05-08 NOTE — Progress Notes (Signed)
Subjective: Sedated, did not waken  Objective: Vital signs in last 24 hours: Temp:  [97.8 F (36.6 C)-100 F (37.8 C)] 98 F (36.7 C) (05/03 0800) Pulse Rate:  [43-180] 113 (05/03 0800) Resp:  [21-51] 41 (05/03 0800) BP: (112-196)/(54-145) 174/75 mmHg (05/03 0816) SpO2:  [93 %-97 %] 95 % (05/03 0800) Weight change:  Last BM Date: 05/07/14 Intake/Output from previous day: +2346 05/02 0701 - 05/03 0700 In: 3346 [I.V.:2696; IV Piggyback:650] Out: 1000 [Urine:1000] Intake/Output this shift:    PE: General:sedated, NAD Skin:Warm and dry, brisk capillary refill HEENT:normocephalic, mucus membranes moist Neck:supple, + JVD Heart:S1S2 RRR without murmur, gallup, rub or click Lungs: without rales, + rhonchi, occ wheeze MVH:QION, non tender, + BS- hypoactive, do not palpate liver spleen or masses Ext:no lower ext edema, SCD stockings in place ,  2+ pedal pulses, 2+ radial pulses Neuro:sedated, + facial symmetry Tele:  A fib mostly rate controlled   Lab Results:  Recent Labs  05/08/14 0340  WBC 14.4*  HGB 13.0  HCT 38.6*  PLT 239   BMET  Recent Labs  05/07/14 1725 05/08/14 0340  NA  --  137  K  --  2.6*  CL  --  110  CO2  --  22  GLUCOSE  --  171*  BUN  --  14  CREATININE 0.69 0.73  CALCIUM  --  7.7*   No results for input(s): TROPONINI in the last 72 hours.  Invalid input(s): CK, MB  No results found for: CHOL, HDL, LDLCALC, LDLDIRECT, TRIG, CHOLHDL No results found for: HGBA1C   Lab Results  Component Value Date   TSH 1.523 05/05/2014    Studies/Results: Ct Aspiration  05/07/2014   CLINICAL DATA:  Right hip and pelvic pain, fever. Right psoas process on recent CT. Recent aspiration was nondiagnostic.  EXAM: CT GUIDED NEEDLE ASPIRATE BIOPSY OF RIGHT PSOAS PROCESS  PROCEDURE: The procedure risks, benefits, and alternatives were explained to the family. Questions regarding the procedure were encouraged and answered. The family understands and  consents to the procedure.  Select axial scans through the lower pelvis were obtained and the collection was localized. An appropriate skin entry site was determined and marked.  The operative field was prepped with Betadinein a sterile fashion, and a sterile drape was applied covering the operative field. A sterile gown and sterile gloves were used for the procedure. Local anesthesia was provided with 1% Lidocaine.  Under CT fluoroscopic guidance, an 18 gauge trocar needle was advanced into the lesion. Once needle tip position was confirmed, aspiration returned only 5 mL of bloody purulent material. The patient tolerated the procedure well.  COMPLICATIONS: None immediate  FINDINGS: Asymmetric enlargement right psoas musculature. Aspiration of the right psoas process returned 5 mL of bloody purulent material. This was sent for Gram stain, culture and sensitivity.  IMPRESSION: Aspiration of right psoas process returns 5 mL bloody purulent material, sent for Gram stain, culture and sensitivity.   Electronically Signed   By: Lucrezia Europe M.D.   On: 05/07/2014 16:40   Ct Aspiration  05/06/2014   CLINICAL DATA:  79 year old male with a history of bacteremia and sepsis.  CT imaging demonstrates a heterogeneous collection within the right psoas musculature. He has been referred for potential drainage of a presumed abscess.  EXAM: CT GUIDED DRAINAGE OF RIGHT PSOAS ABSCESS  ANESTHESIA/SEDATION: Zero Mg IV Versed 0 mcg IV Fentanyl  Total Moderate Sedation Time:  0 minutes  PROCEDURE:  The procedure, risks, benefits, and alternatives were explained to the patient's family. Questions regarding the procedure were encouraged and answered. The patient understands and consents to the procedure.  Patient is position in the supine position on the CT gantry table. CT scan of the pelvis was acquired.  The the right inguinal region was prepped with Betadinein a sterile fashion, and a sterile drape was applied covering the operative  field. A sterile gown and sterile gloves were used for the procedure. Local anesthesia was provided with 1% Lidocaine.  Once the patient is prepped and draped sterilely, the skin and subcutaneous tissues overlying the right psoas were generously infiltrated with 1% lidocaine for local anesthesia. A small stab incision was made with 11 blade scalpel.  A trocar needle was advanced with CT guidance into the greatest diameter of the collection involving the right psoas. Once we confirmed the tip of the needle with imaging, the stylet was removed and a wire was placed.  Serial dilation of the soft tissues was performed, and then a 10 [place] drain was placed into the collection.  Vigorous aspiration was attempted while manipulating the 10 French tube. No fluid was aspirated.  The patient tolerated the procedure well and remained hemodynamically stable throughout.  No complications were encountered and no blood loss was encounter.  A sterile dressing was placed.  COMPLICATIONS: None  FINDINGS: CT study of the pelvis demonstrates heterogeneously dense collection within the right psoas musculature, similar in size to the comparison CT. No significant peripheral stranding.  When placing the needle into the collection, the patient experienced some discomfort.  CT images demonstrate placement of pigtail catheter into the lateral collection, safely lateral to the femoral vasculature as well as the inguinal canal.  No fluid could be aspirated with vigorous aspiration and replacement of the tube. Upon withdrawal of the drain no fluid could be aspirated.  IMPRESSION: Status post attempted aspiration of dense fluid collection within the right psoas musculature. No fluid could be aspirated once the 10 French drain was placed into the fluid collection, suggesting non liquified hematoma at this time. No drain was left, as this could lead to potential seeding with bacteria of a sterile hematoma.  Signed,  Dulcy Fanny. Earleen Newport, DO  [facility]  [facility]   Electronically Signed   By: Corrie Mckusick D.O.   On: 05/06/2014 16:29   Dg Chest Port 1 View  05/08/2014   CLINICAL DATA:  Dyspnea.  Unresponsive.  EXAM: PORTABLE CHEST - 1 VIEW  COMPARISON:  05/06/2014  FINDINGS: There is moderate unchanged cardiomegaly. No airspace consolidation is evident. There is no large effusion.  IMPRESSION: Unchanged cardiomegaly. No airspace consolidation or large effusion.   Electronically Signed   By: Andreas Newport M.D.   On: 05/08/2014 05:52   Dg Chest Port 1 View  05/06/2014   CLINICAL DATA:  Fever and worsening shortness of breath began today.  EXAM: PORTABLE CHEST - 1 VIEW  COMPARISON:  05/04/2014  FINDINGS: Patient motion degrades film quality. Patient has had prior bilateral shoulder arthroplasty. The heart is enlarged. Suspect developing infiltrate at the left lung base. Mild interstitial edema noted.  IMPRESSION: 1. Cardiomegaly and mild edema. 2. Suspect developing infiltrate in the left lower lobe.   Electronically Signed   By: Nolon Nations M.D.   On: 05/06/2014 17:31   ECHO: Left ventricle: The cavity size was normal. Wall thickness was increased in a pattern of severe LVH. Systolic function was normal. The  estimated ejection fraction was in the range of 60% to 65%. Wall motion was normal; there were no regional wall motion abnormalities. The study is not technically sufficient to allow evaluation of LV diastolic function. - Aortic valve: Sclerosis without stenosis. There was trivial regurgitation. - Mitral valve: Mildly thickened leaflets . There was moderate regurgitation. - Left atrium: The atrium was at the upper limits of normal in size. - Right ventricle: The cavity size was moderately dilated. - Right atrium: The atrium was mildly dilated. - Tricuspid valve: There was moderate regurgitation. - Pulmonary arteries: PA peak pressure: 41 mm Hg (S). - Inferior vena  cava: The vessel was dilated. The respirophasic diameter changes were blunted (< 50%), consistent with elevated central venous pressure.  Impressions:  - LVEF 60-65%, moderate LVH, aortic sclerosis with trivial AI, mild RAE, moderate MR and TR, RVSP 41 mmHg, dilated IVC.   Medications: I have reviewed the patient's current medications. Scheduled Meds: . antiseptic oral rinse  7 mL Mouth Rinse BID  . febuxostat  80 mg Oral Daily  . folic acid  1 mg Oral Daily  . insulin aspart  0-9 Units Subcutaneous Q4H  . metoprolol  5 mg Intravenous Once  . multivitamin with minerals  1 tablet Oral Daily  . piperacillin-tazobactam (ZOSYN)  IV  3.375 g Intravenous Q8H  . potassium chloride  10 mEq Intravenous Q1 Hr x 3  . thiamine  100 mg Oral Daily   Or  . thiamine  100 mg Intravenous Daily  . vancomycin  1,250 mg Intravenous Q12H   Continuous Infusions: . dextrose 5 % and 0.45% NaCl 1,000 mL with potassium chloride 20 mEq infusion 50 mL/hr at 05/08/14 0510  . diltiazem (CARDIZEM) infusion 5 mg/hr (05/07/14 1340)   PRN Meds:.acetaminophen **OR** acetaminophen, haloperidol lactate, hydrALAZINE, HYDROmorphone (DILAUDID) injection, LORazepam, ondansetron **OR** ondansetron (ZOFRAN) IV  Assessment/Plan: 1. New-onset rapid atrial fibrillation: Most likely triggered by iliopsoas abscess and bacteremia, but chronic alcoholism also likely contributing. TSH normal. Echocardiogram Left atrium: The atrium was at the upper limits of normal, LVEF 60-65%, moderate LVH, aortic sclerosis with trivial AI, mild RAE, moderate MR and TR, RVSP 41 mmHg, dilated IVC. Rate controlled on dilt drip at 5.   Once IR has completed abscess drainage and it has been deemed safe, and if atrial fibrillation is nonvalvular, would recommend anticoagulation with Xarelto 20 mg daily for feasibility. CHADSVASC is 4, thus thromboembolic risk is high. However, given chronic alcoholism, compliance may be an issue.- difficult to  decide with confusion HOLD for now.  IV diltiazem was initiated on 4/30. Now on 10 mg /hr fairly rate controlled- likely related to agitation from combination of bacteremia and alcohol withdrawal (drinks 3 glasses of vodka daily). On haldol. Once his abscess is drained and he is adequately treated for infection,  would consider transitioning to oral long-acting diltiazem (rather than atenolol) if LV systolic function is normal.  2. Essential HTN: consider transitioning to oral long-acting diltiazem (rather than atenolol) if LV systolic function is normal which may help with BP control as well. Current ranges from 162/78- 183/89 , hydralazine prn BP poorly controlled could add low dose IV BB   3. Iliopsoas absess: Currently on IV fluids and broad-spectrum antimicrobial therapy, with abscess drained yesterday, 5 ml bloody purulent material.  4. Agitation/confusion: Likely related to both bacteremia and alcohol withdrawal. With increased Resp. Distress   5. DM followed by IM   LOS: 4 days   Time spent with pt. :15 minutes.  [facility]  Nurse Practitioner Certified Pager [phone] or after 5pm and on weekends call [phone] 05/08/2014, 8:40 AM   History and all data above reviewed.  Still not opening eyes or responding to me.  Patient examined.  I agree with the findings as above.  The patient exam reveals PQD:[id number]  ,  Lungs: Decreased breath sounds  ,  Abd: Positive bowel sounds, no rebound no guarding, Ext Mild edema  .  All available labs, radiology testing, previous records reviewed. Agree with documented assessment and plan. Atrial fib:  Continue IV Dilt.  HTN:  I will change to scheduled hydralazine.  Xayne Jahki Witham  1:27 PM  05/08/2014

## 2014-05-09 DIAGNOSIS — G9340 Encephalopathy, unspecified: Secondary | ICD-10-CM

## 2014-05-09 DIAGNOSIS — A408 Other streptococcal sepsis: Secondary | ICD-10-CM

## 2014-05-09 DIAGNOSIS — R7881 Bacteremia: Secondary | ICD-10-CM

## 2014-05-09 DIAGNOSIS — B954 Other streptococcus as the cause of diseases classified elsewhere: Secondary | ICD-10-CM

## 2014-05-09 DIAGNOSIS — B955 Unspecified streptococcus as the cause of diseases classified elsewhere: Secondary | ICD-10-CM

## 2014-05-09 HISTORY — DX: Unspecified streptococcus as the cause of diseases classified elsewhere: B95.5

## 2014-05-09 HISTORY — DX: Encephalopathy, unspecified: G93.40

## 2014-05-09 LAB — PHOSPHORUS: Phosphorus: 3.3 mg/dL (ref 2.5–4.6)

## 2014-05-09 LAB — GLUCOSE, CAPILLARY
GLUCOSE-CAPILLARY: 153 mg/dL — AB (ref 70–99)
GLUCOSE-CAPILLARY: 127 mg/dL — AB (ref 70–99)
Glucose-Capillary: 155 mg/dL — ABNORMAL HIGH (ref 70–99)
Glucose-Capillary: 158 mg/dL — ABNORMAL HIGH (ref 70–99)
Glucose-Capillary: 168 mg/dL — ABNORMAL HIGH (ref 70–99)
Glucose-Capillary: 188 mg/dL — ABNORMAL HIGH (ref 70–99)

## 2014-05-09 LAB — CBC
HCT: 42 % (ref 39.0–52.0)
HEMOGLOBIN: 14.1 g/dL (ref 13.0–17.0)
MCH: 32.7 pg (ref 26.0–34.0)
MCHC: 33.6 g/dL (ref 30.0–36.0)
MCV: 97.4 fL (ref 78.0–100.0)
PLATELETS: 349 10*3/uL (ref 150–400)
RBC: 4.31 MIL/uL (ref 4.22–5.81)
RDW: 14.2 % (ref 11.5–15.5)
WBC: 18.3 10*3/uL — AB (ref 4.0–10.5)

## 2014-05-09 LAB — BASIC METABOLIC PANEL
Anion gap: 8 (ref 5–15)
BUN: 17 mg/dL (ref 6–20)
CHLORIDE: 109 mmol/L (ref 101–111)
CO2: 23 mmol/L (ref 22–32)
Calcium: 7.8 mg/dL — ABNORMAL LOW (ref 8.9–10.3)
Creatinine, Ser: 0.66 mg/dL (ref 0.61–1.24)
GFR calc Af Amer: >60 mL/min (ref >60)
GLUCOSE: 184 mg/dL — AB (ref 70–99)
POTASSIUM: 3.1 mmol/L — AB (ref 3.5–5.1)
Sodium: 140 mmol/L (ref 135–145)

## 2014-05-09 LAB — AMMONIA: Ammonia: 29 umol/L (ref 9–35)

## 2014-05-09 LAB — MAGNESIUM: Magnesium: 1.3 mg/dL — ABNORMAL LOW (ref 1.7–2.4)

## 2014-05-09 MED ORDER — CEFTRIAXONE SODIUM IN DEXTROSE 40 MG/ML IV SOLN
2.0000 g | INTRAVENOUS | Status: DC
  Administered 2014-05-09 – 2014-05-23 (×15): 2 g via INTRAVENOUS
  Filled 2014-05-09 (×16): qty 50

## 2014-05-09 MED ORDER — LORAZEPAM 2 MG/ML IJ SOLN
2.0000 mg | INTRAMUSCULAR | Status: DC | PRN
  Administered 2014-05-09: 2 mg via INTRAVENOUS
  Filled 2014-05-09: qty 1

## 2014-05-09 MED ORDER — POTASSIUM CHLORIDE 10 MEQ/100ML IV SOLN
10.0000 meq | INTRAVENOUS | Status: AC
  Administered 2014-05-09 (×2): 10 meq via INTRAVENOUS
  Filled 2014-05-09: qty 100

## 2014-05-09 MED ORDER — FOLIC ACID 5 MG/ML IJ SOLN
1.0000 mg | Freq: Every day | INTRAMUSCULAR | Status: DC
  Administered 2014-05-10: 1 mg via INTRAVENOUS
  Filled 2014-05-09 (×6): qty 0.2

## 2014-05-09 MED ORDER — POTASSIUM CHLORIDE 10 MEQ/100ML IV SOLN
10.0000 meq | INTRAVENOUS | Status: AC
  Administered 2014-05-09 (×3): 10 meq via INTRAVENOUS
  Filled 2014-05-09: qty 100

## 2014-05-09 MED ORDER — LORAZEPAM 2 MG/ML IJ SOLN
1.0000 mg | Freq: Once | INTRAMUSCULAR | Status: AC
  Administered 2014-05-09: 1 mg via INTRAVENOUS

## 2014-05-09 MED ORDER — LORAZEPAM 2 MG/ML IJ SOLN: 1.0000 mg | INTRAMUSCULAR | Status: DC | PRN

## 2014-05-09 MED ORDER — THIAMINE HCL 100 MG/ML IJ SOLN
Freq: Once | INTRAVENOUS | Status: AC
  Administered 2014-05-09: 14:00:00 via INTRAVENOUS
  Filled 2014-05-09: qty 1000

## 2014-05-09 MED ORDER — THIAMINE HCL 100 MG/ML IJ SOLN
100.0000 mg | Freq: Every day | INTRAMUSCULAR | Status: DC
  Filled 2014-05-09: qty 2

## 2014-05-09 MED ORDER — MAGNESIUM SULFATE 4 GM/100ML IV SOLN
4.0000 g | Freq: Once | INTRAVENOUS | Status: AC
  Administered 2014-05-09: 4 g via INTRAVENOUS
  Filled 2014-05-09: qty 100

## 2014-05-09 MED ORDER — LORAZEPAM 2 MG/ML IJ SOLN
1.0000 mg | INTRAMUSCULAR | Status: DC | PRN
  Administered 2014-05-09 – 2014-05-12 (×6): 2 mg via INTRAVENOUS
  Filled 2014-05-09 (×7): qty 1

## 2014-05-09 NOTE — Progress Notes (Signed)
Subjective: Responds to pain only currently  Objective: Vital signs in last 24 hours: Temp:  [97.6 F (36.4 C)-100.5 F (38.1 C)] 97.6 F (36.4 C) (05/04 0800) Pulse Rate:  [46-167] 105 (05/04 0700) Resp:  [23-50] 23 (05/04 0700) BP: (123-205)/(58-101) 143/76 mmHg (05/04 0700) SpO2:  [89 %-98 %] 94 % (05/04 0700) Weight change:  Last BM Date: 05/07/14 Intake/Output from previous day: -314.8  (since admit +2127) no recent wts. 05/03 0701 - 05/04 0700 In: 2369.8 [I.V.:1419.8; IV Piggyback:950] Out: 2675 [Urine:2675] Intake/Output this shift:    PE: General: NAD- sleeping Skin:Warm and dry, brisk capillary refill HEENT:normocephalic, mucus membranes moist Neck:supple, no JVD Heart:irreg irreg without murmur, gallup, rub or click Lungs: without rales, + rhonchi, no wheezes currenlty NUU:VOZD, non tender, + BS, do not palpate liver spleen or masses Ext:no lower ext edema,SCD stockings in place 2+ radial pulses Neuro:per RN responds to pain  + facial symmetry Tele: a fib with PVCs, varying rates.  dilt titrated to control rate.    Lab Results:  Recent Labs  05/08/14 0340 05/09/14 0400  WBC 14.4* 18.3*  HGB 13.0 14.1  HCT 38.6* 42.0  PLT 239 349   BMET  Recent Labs  05/08/14 1805 05/09/14 0400  NA 141 140  K 2.4* 3.1*  CL 108 109  CO2 24 23  GLUCOSE 153* 184*  BUN 13 17  CREATININE 0.73 0.66  CALCIUM 8.1* 7.8*   No results for input(s): TROPONINI in the last 72 hours.  Invalid input(s): CK, MB  No results found for: CHOL, HDL, LDLCALC, LDLDIRECT, TRIG, CHOLHDL No results found for: HGBA1C   Lab Results  Component Value Date   TSH 1.523 05/05/2014     Studies/Results: Ct Aspiration  05/07/2014   CLINICAL DATA:  Right hip and pelvic pain, fever. Right psoas process on recent CT. Recent aspiration was nondiagnostic.  EXAM: CT GUIDED NEEDLE ASPIRATE BIOPSY OF RIGHT PSOAS PROCESS  PROCEDURE: The procedure risks, benefits, and alternatives  were explained to the family. Questions regarding the procedure were encouraged and answered. The family understands and consents to the procedure.  Select axial scans through the lower pelvis were obtained and the collection was localized. An appropriate skin entry site was determined and marked.  The operative field was prepped with Betadinein a sterile fashion, and a sterile drape was applied covering the operative field. A sterile gown and sterile gloves were used for the procedure. Local anesthesia was provided with 1% Lidocaine.  Under CT fluoroscopic guidance, an 18 gauge trocar needle was advanced into the lesion. Once needle tip position was confirmed, aspiration returned only 5 mL of bloody purulent material. The patient tolerated the procedure well.  COMPLICATIONS: None immediate  FINDINGS: Asymmetric enlargement right psoas musculature. Aspiration of the right psoas process returned 5 mL of bloody purulent material. This was sent for Gram stain, culture and sensitivity.  IMPRESSION: Aspiration of right psoas process returns 5 mL bloody purulent material, sent for Gram stain, culture and sensitivity.   Electronically Signed   By: Lucrezia Europe M.D.   On: 05/07/2014 16:40   Dg Chest Port 1 View  05/08/2014   CLINICAL DATA:  Dyspnea.  Unresponsive.  EXAM: PORTABLE CHEST - 1 VIEW  COMPARISON:  05/06/2014  FINDINGS: There is moderate unchanged cardiomegaly. No airspace consolidation is evident. There is no large effusion.  IMPRESSION: Unchanged cardiomegaly. No airspace consolidation or large effusion.   Electronically Signed   By: Shaune Pascal  Alroy Dust M.D.   On: 05/08/2014 05:52    Medications: I have reviewed the patient's current medications. Scheduled Meds: . antiseptic oral rinse  7 mL Mouth Rinse BID  . febuxostat  80 mg Oral Daily  . folic acid  1 mg Oral Daily  . folic acid  1 mg Intravenous Daily  . hydrALAZINE  20 mg Intravenous Q4H  . insulin aspart  0-9 Units Subcutaneous Q4H  . metoprolol   5 mg Intravenous Once  . multivitamin with minerals  1 tablet Oral Daily  . piperacillin-tazobactam (ZOSYN)  IV  3.375 g Intravenous Q8H  . potassium chloride  10 mEq Intravenous Q1 Hr x 3  . potassium chloride  10 mEq Intravenous Q1 Hr x 5  . thiamine  100 mg Oral Daily   Or  . thiamine  100 mg Intravenous Daily  . thiamine  100 mg Intravenous Daily  . vancomycin  1,250 mg Intravenous Q12H   Continuous Infusions: . dextrose 5 % and 0.45% NaCl 1,000 mL with potassium chloride 20 mEq infusion 50 mL/hr at 05/08/14 2229  . diltiazem (CARDIZEM) infusion 5 mg/hr (05/09/14 0648)   PRN Meds:.acetaminophen **OR** acetaminophen, haloperidol lactate, HYDROmorphone (DILAUDID) injection, LORazepam, LORazepam, ondansetron **OR** ondansetron (ZOFRAN) IV  Assessment/Plan: 1. New-onset rapid atrial fibrillation this admit : Most likely triggered by iliopsoas abscess and bacteremia, but chronic alcoholism also likely contributing. TSH normal. Echocardiogram Left atrium: The atrium was at the upper limits of normal, LVEF 60-65%, moderate LVH, aortic sclerosis with trivial AI, mild RAE, moderate MR and TR, RVSP 41 mmHg, dilated IVC. HR during the night due to agitation.  Rate controlled on dilt drip at 15 during the night, this AM titrated to control rate now at 7.5mg /hr but never below 5. .   IR has completed abscess drainage.  Atrial fibrillation is nonvalvular, would recommend anticoagulation with Xarelto 20 mg daily for feasibility. Once pt stable.  CHADSVASC is 4, thus thromboembolic risk is high. However, given chronic alcoholism, compliance may be an issue.- difficult to decide with confusion HOLD for now.  IV diltiazem was initiated on 4/30. Now on 7.5 mg /hr fairly rate controlled- likely related to agitation from combination of bacteremia and alcohol withdrawal (drinks 3 glasses of vodka daily). On haldol. Once he is adequately treated for infection and his encephalopathy improves would consider  transitioning to oral long-acting diltiazem (rather than atenolol).  2. Essential HTN: consider transitioning to oral long-acting diltiazem (rather than atenolol)  Current BP  ranges from 205/86 to 123/ 64 , now on hydralazine 20 mg IV every 4 hours with some improvement.   3. Iliopsoas absess: Currently on IV fluids and broad-spectrum antimicrobial therapy, with abscess drained yesterday, 5 ml bloody purulent material. Also concern about infected Rt hip that was replaced several years ago, ortho following.   4. Encephalopathy: Agitation/confusion increased during the night: Likely related to both bacteremia and alcohol withdrawal. With increased Resp. Distress last pm, more stable currently.  5. DM followed by IM  6. Hypokalemia replacing.       LOS: 5 days   Time spent with pt. :15 minutes. [facility] R  Nurse Practitioner Certified Pager [phone] or after 5pm and on weekends call [phone] 05/09/2014, 9:36 AM  History and all data above reviewed.  Patient examined. Awake, confused, agitated. No focal complaints.  I agree with the findings as above.  Scheduled hydralazine added yesterday.  The patient exam reveals MWU:[id number]  ,  Lungs: Clear  ,  Abd: Distended, Ext  Trace edema  .  All available labs, radiology testing, previous records reviewed. Agree with documented assessment and plan. Atrial fib:  No change in meds until taking POs.  He is starting to wake up.  We can decide about long term NOAC sometime prior to discharge.   Anup [facility]  11:37 AM  05/09/2014

## 2014-05-09 NOTE — Progress Notes (Signed)
NP to the bedside for change in pt agitation. Ativan 1mg  and morphine 2mg  ordered. MD aware of pt WOB, desaturations, agitation level and cyclic changes in vital signs. No other orders given. Will continue to monitor

## 2014-05-09 NOTE — Progress Notes (Signed)
Name: [name] MRN: [id number] DOB: Mar 11, 1935    ADMISSION DATE:  05/04/2014 CONSULTATION DATE:  05/09/14  REFERRING MD :  Dr. Grandville Silos   CHIEF COMPLAINT:  AMS, R hip pain   BRIEF PATIENT DESCRIPTION: 79 y/o M, smoker, with PMH of ETOH abuse, DM II, HTN & gout who was admitted on 4/29 with worsening R hip, RLQ pain, fevers/chills x 1 week.  Work up included a CT of the pelvis which was concerning for iliopsoas abscess. He was also noted to have new onset AF w RVR.  He was admitted for evaluation by [facility].  Initially, was oriented with normal mental status.  He later developed altered mental status & PCCM was consulted for evaluation. Of note, he had received 3mg  Ativan in last 10 hours.   SIGNIFICANT EVENTS  4/29  Admit per [facility] for R hip pain, fevers/chills and concern for R iliopsoas abscess.  New AF noted (rx w dilt).   5/01  CT guided aspiration of R hip abscess, no fluid aspirated  5/02  CT guided aspiration of R hip, 5 ml of bloody purulent material  5/04  PCCM consulted for AMS, bacteremia   STUDIES:  4/29  CT Pelvis >> rim enhancing mass along R lower aspect of iliopsoas muscle concerning for abscess, s/p R total hip, sigmoid diverticulosis  5/01  ECHO >>nml LV fn, RVSP 41 5/02  Abscess culture >> abundant wbc >>     SUBJECTIVE: Afebrile, VSS stable, WBC decreasing. Patient asking for candy.    VITAL SIGNS: Temp:  [97.6 F (36.4 C)-100.5 F (38.1 C)] 97.6 F (36.4 C) (05/04 0800) Pulse Rate:  [93-167] 105 (05/04 0700) Resp:  [23-50] 23 (05/04 0700) BP: (123-205)/(58-101) 143/76 mmHg (05/04 0700) SpO2:  [89 %-98 %] 94 % (05/04 0700)  PHYSICAL EXAMINATION: General: Chronically ill-appearing in NAD Neuro: Pt awake, alert, not oriented, asking for candy & water HEENT: poor dentition / broken teeth, no JVD Cardiovascular: S1-S2 regular Lungs: resp's non-labored, lungs bilaterally clear Abdomen: Soft and nontender Musculoskeletal: No edema, abrasions or  legs Skin: Warm no rash   Recent Labs Lab 05/08/14 0340 05/08/14 1805 05/09/14 0400  NA 137 141 140  K 2.6* 2.4* 3.1*  CL 110 108 109  CO2 22 24 23   BUN 14 13 17   CREATININE 0.73 0.73 0.66  GLUCOSE 171* 153* 184*    Recent Labs Lab 05/05/14 0500 05/08/14 0340 05/09/14 0400  HGB 13.6 13.0 14.1  HCT 39.0 38.6* 42.0  WBC 9.8 14.4* 18.3*  PLT 206 239 349   Ct Aspiration  05/07/2014   CLINICAL DATA:  Right hip and pelvic pain, fever. Right psoas process on recent CT. Recent aspiration was nondiagnostic.  EXAM: CT GUIDED NEEDLE ASPIRATE BIOPSY OF RIGHT PSOAS PROCESS  PROCEDURE: The procedure risks, benefits, and alternatives were explained to the family. Questions regarding the procedure were encouraged and answered. The family understands and consents to the procedure.  Select axial scans through the lower pelvis were obtained and the collection was localized. An appropriate skin entry site was determined and marked.  The operative field was prepped with Betadinein a sterile fashion, and a sterile drape was applied covering the operative field. A sterile gown and sterile gloves were used for the procedure. Local anesthesia was provided with 1% Lidocaine.  Under CT fluoroscopic guidance, an 18 gauge trocar needle was advanced into the lesion. Once needle tip position was confirmed, aspiration returned only 5 mL of bloody purulent material. The patient tolerated  the procedure well.  COMPLICATIONS: None immediate  FINDINGS: Asymmetric enlargement right psoas musculature. Aspiration of the right psoas process returned 5 mL of bloody purulent material. This was sent for Gram stain, culture and sensitivity.  IMPRESSION: Aspiration of right psoas process returns 5 mL bloody purulent material, sent for Gram stain, culture and sensitivity.   Electronically Signed   By: Lucrezia Europe M.D.   On: 05/07/2014 16:40   Dg Chest Port 1 View  05/08/2014   CLINICAL DATA:  Dyspnea.  Unresponsive.  EXAM: PORTABLE  CHEST - 1 VIEW  COMPARISON:  05/06/2014  FINDINGS: There is moderate unchanged cardiomegaly. No airspace consolidation is evident. There is no large effusion.  IMPRESSION: Unchanged cardiomegaly. No airspace consolidation or large effusion.   Electronically Signed   By: Andreas Newport M.D.   On: 05/08/2014 05:52    ASSESSMENT / PLAN:   Acute Encephalopathy - in setting of ETOH withdrawal + Ativan administration  Strep Bovis Bacteremia  Hypokalemia  Hypomagnesemia Atrial Fibrillation w RVR - secondary to w/d + bacteremia   Plan: NTS PRN  Oral care Pulmonary hygiene, mobilize as able  Reduce dose ativan ABX per primary  Thiamine / folate / MVI He may need scanning of the right hip to ensure no joint involvement -defer to primary team, may need to review with radiology first. AF per Cardiology   PCCM will be available PRN.  Please call if new needs arise.   Noe Gens, NP-C [facility] Pgr: [phone] or [phone] 05/09/2014, 11:29 AM

## 2014-05-09 NOTE — Progress Notes (Signed)
[facility] PROGRESS NOTE  [name] ZCH:[id number] DOB: 1935/08/31 DOA: 05/04/2014 PCP: Lujean Amel, MD  Assessment/Plan: #1 sepsis Patient on presentation her temperature 101.5  increased heart rateany new onset A. Fib in the presence of infection. Blood cultures with 1 out of 2 growing streptococcal bovis. Right iliopsoas has been aspirated and cultures are pending. Patient still with fevers. Continue empiric IV vancomycin and IV Rocephin. Patient has been seen by ID.  #2 acute encephalopathy Questionable etiology. May be metabolic in nature secondary to acute infection with psoas abscess and probable streptococcal bovis bacteremia and probable alcohol withdrawal. Patient grunts with noxious stimuli with some gurgling. Will suction patient. Will discontinue the Ativan withdrawal protocol for now until patient is more alert. Will place patient on Ativan 1 mg every 4 hours as needed. Appreciate critical care consultation. Continue empiric IV antibiotics. Follow.  #3 streptococcal bovis bacteremia Likely seeded from the GI tract. Patient has been seen in consultation by infectious disease and recommended narrowing antibiotics. Concern also for colorectal malignancy. Will get CT of the abdomen and pelvis as patient still spiking fevers. Patient will likely need a colonoscopy as outpatient. Patient currently on IV vancomycin and IV Rocephin. ID following and appreciate input and recommendations.  #4 iliopsoas abscess Status post aspiration. Cultures are pending. Continue empiric IV vancomycin and IV Rocephin. Follow.  #5 fever Likely secondary to problems #2 and 3. Concern for possible hip infection. May need to image the right hip for further evaluation to rule out a septic joint. CT abdomen and pelvis. Continue empiric IV antibiotics for now. ID following.  #6 New Onset atrial fibrillation Likely secondary to iliopsoas abscess and bacteremia.  TSH within normal limits. 2-D echo with  EF of 60-65% with moderate LVH, aortic sclerosis with trivial AI, mild RAE, moderate MR and TR, dilated IVC. Patient currently on a Cardizem drip. Once more alert may transition to oral long-acting Cardizem for rate control. Patient with a CHADSVASC score 4. Patient likely needs anticoagulation have a due to history of chronic alcoholism question of compliance. Cardiology following and appreciate input and recommendations.  #7 hypertension Currently on Cardizem. When patient is more alert will transition to long-acting Cardizem.  #8 hypokalemia/hypomagnesemia Replete.  #9 dm 2 Currently NPO secondary to altered MS.CBGs have ranged from 153-188.sliding scale insulin.  #10 history of alcohol abuse Patient minimally responsive to noxious stimuli. Will discontinue the C1 protocol. Will put him on Ativan as needed for agitation. Appreciate critical care input.  #11 prophylaxis SCDs for DVT prophylaxis.  Code Status: full Family Communication: no family at bedside. Disposition Plan: remain in the step down unit.   Consultants:  Gen. Surgery: Dr. Lucia Gaskins 05/04/2014  Cardiology: Dr. Jacinta Shoe 05/05/2014  Orthopedics: Dr. Berenice Primas 05/08/2014  PCCM: Dr. Elsworth Soho 05/09/2014  Infectious disease: Dr. Linus Salmons 05/09/2014  Procedures:   CT pelvis 05/04/2014   plain films of the right hip and pelvis 05/04/2014   2-D echo 05/06/2014  Chest x-ray 05/08/2014, 05/06/2014, 05/04/2014  X-ray of the right knee 05/04/2014  CT aspiration 5 mL of bloody purulent material 05/07/2014  Antibiotics:   IV vancomycin   05/04/2014  IV Zosyn  05/04/2014>>>> 05/09/2014   IV Rocephin 05/09/2014  HPI/Subjective:  patient minimally responsive to noxious stimuli. Patient grunts with noxious stimuli.  Objective: Filed Vitals:   05/09/14 1500  BP: 162/67  Pulse: 107  Temp:   Resp: 37    Intake/Output Summary (Last 24 hours) at 05/09/14 1545 Last data filed at 05/09/14 1518  Gross  per 24 hour   Intake 2627.79 ml  Output    590 ml  Net 2037.79 ml   Filed Weights   05/04/14 1617 05/04/14 2319  Weight: 102.059 kg (225 lb) 101.3 kg (223 lb 5.2 oz)    Exam:   General:   Patient's snoring and minimally responsive to noxious stimuli grunts with noxious stimuli. Patient with some gurgling.  Cardiovascular:  Irregularly irregular.  Respiratory:  Some  coarse sounds in anterior lung fields.  Abdomen:  Soft, nontender, nondistended, positive bowel sounds.  Musculoskeletal:  No clubbing cyanosis or edema.  Data Reviewed: Basic Metabolic Panel:  Recent Labs Lab 05/04/14 1500 05/05/14 0500 05/07/14 1725 05/08/14 0340 05/08/14 1805 05/09/14 0400 05/09/14 0915  NA 133* 134*  --  137 141 140  --   K 3.6 3.6  --  2.6* 2.4* 3.1*  --   CL 94* 99  --  110 108 109  --   CO2 22 24  --  22 24 23   --   GLUCOSE 146* 126*  --  171* 153* 184*  --   BUN 38* 28*  --  14 13 17   --   CREATININE 0.78 0.83 0.69 0.73 0.73 0.66  --   CALCIUM 8.9 8.4  --  7.7* 8.1* 7.8*  --   MG  --   --   --   --   --   --  1.3*  PHOS  --   --   --   --   --   --  3.3   Liver Function Tests:  Recent Labs Lab 05/04/14 1500 05/05/14 0500  AST 152* 167*  ALT 92* 111*  ALKPHOS 111 120*  BILITOT 1.6* 1.8*  PROT 7.4 7.4  ALBUMIN 3.6 3.5   No results for input(s): LIPASE, AMYLASE in the last 168 hours.  Recent Labs Lab 05/09/14 0915  AMMONIA 29   CBC:  Recent Labs Lab 05/04/14 1500 05/05/14 0500 05/08/14 0340 05/09/14 0400  WBC 10.3 9.8 14.4* 18.3*  NEUTROABS 8.0* 7.2  --   --   HGB 13.0 13.6 13.0 14.1  HCT 37.7* 39.0 38.6* 42.0  MCV 95.2 96.3 95.1 97.4  PLT 180 206 239 349   Cardiac Enzymes: No results for input(s): CKTOTAL, CKMB, CKMBINDEX, TROPONINI in the last 168 hours. BNP (last 3 results) No results for input(s): BNP in the last 8760 hours.  ProBNP (last 3 results) No results for input(s): PROBNP in the last 8760 hours.  CBG:  Recent Labs Lab 05/08/14 2021  05/09/14 0030 05/09/14 0321 05/09/14 0745 05/09/14 1344  GLUCAP 132* 188* 168* 153* 158*    Recent Results (from the past 240 hour(s))  Blood Culture (routine x 2)     Status: None (Preliminary result)   Collection Time: 05/04/14  3:00 PM  Result Value Ref Range Status   Specimen Description BLOOD LEFT WRIST  5 ML IN [facility] BOTTLE  Final   Special Requests NONE  Final   Culture   Final           BLOOD CULTURE RECEIVED NO GROWTH TO DATE CULTURE WILL BE HELD FOR 5 DAYS BEFORE ISSUING A FINAL NEGATIVE REPORT Performed at [facility]    Report Status PENDING  Incomplete  Blood Culture (routine x 2)     Status: None   Collection Time: 05/04/14  4:21 PM  Result Value Ref Range Status   Specimen Description BLOOD RIGHT ANTECUBITAL  Final   Special Requests BOTTLES  DRAWN AEROBIC AND ANAEROBIC 5CC  Final   Culture   Final    STREPTOCOCCUS GROUP D;high probability for S.bovis Note: Gram Stain Report Called to,Read Back By and Verified With: CAROLYN OAKLEY 4.30.16 BY MANGR 503PM Performed at [facility]    Report Status 05/08/2014 FINAL  Final   Organism ID, Bacteria STREPTOCOCCUS GROUP D;high probability for S.bovis  Final      Susceptibility   Streptococcus group d;high probability for s.bovis - MIC (ETEST)*    PENICILLIN .064 SENSITIVE Sensitive     * STREPTOCOCCUS GROUP D;high probability for S.bovis  Urine culture     Status: None   Collection Time: 05/04/14  5:51 PM  Result Value Ref Range Status   Specimen Description URINE, CLEAN CATCH  Final   Special Requests NONE  Final   Colony Count   Final    7,000 COLONIES/ML Performed at [facility]    Culture   Final    INSIGNIFICANT GROWTH Performed at [facility]    Report Status 05/05/2014 FINAL  Final  MRSA PCR Screening     Status: None   Collection Time: 05/04/14 11:24 PM  Result Value Ref Range Status   MRSA by PCR NEGATIVE NEGATIVE Final    Comment:        The GeneXpert MRSA Assay  (FDA approved for NASAL specimens only), is one component of a comprehensive MRSA colonization surveillance program. It is not intended to diagnose MRSA infection nor to guide or monitor treatment for MRSA infections.   Culture, routine-abscess     Status: None (Preliminary result)   Collection Time: 05/07/14  2:29 PM  Result Value Ref Range Status   Specimen Description ABSCESS  Final   Special Requests Normal  Final   Gram Stain   Final    ABUNDANT WBC PRESENT,BOTH PMN AND MONONUCLEAR NO SQUAMOUS EPITHELIAL CELLS SEEN NO ORGANISMS SEEN Performed at [facility]    Culture   Final    Culture reincubated for better growth Performed at [facility]    Report Status PENDING  Incomplete     Studies: Ct Aspiration  05/07/2014   CLINICAL DATA:  Right hip and pelvic pain, fever. Right psoas process on recent CT. Recent aspiration was nondiagnostic.  EXAM: CT GUIDED NEEDLE ASPIRATE BIOPSY OF RIGHT PSOAS PROCESS  PROCEDURE: The procedure risks, benefits, and alternatives were explained to the family. Questions regarding the procedure were encouraged and answered. The family understands and consents to the procedure.  Select axial scans through the lower pelvis were obtained and the collection was localized. An appropriate skin entry site was determined and marked.  The operative field was prepped with Betadinein a sterile fashion, and a sterile drape was applied covering the operative field. A sterile gown and sterile gloves were used for the procedure. Local anesthesia was provided with 1% Lidocaine.  Under CT fluoroscopic guidance, an 18 gauge trocar needle was advanced into the lesion. Once needle tip position was confirmed, aspiration returned only 5 mL of bloody purulent material. The patient tolerated the procedure well.  COMPLICATIONS: None immediate  FINDINGS: Asymmetric enlargement right psoas musculature. Aspiration of the right psoas process returned 5 mL of bloody  purulent material. This was sent for Gram stain, culture and sensitivity.  IMPRESSION: Aspiration of right psoas process returns 5 mL bloody purulent material, sent for Gram stain, culture and sensitivity.   Electronically Signed   By: Lucrezia Europe M.D.   On: 05/07/2014 16:40  Dg Chest Port 1 View  05/08/2014   CLINICAL DATA:  Dyspnea.  Unresponsive.  EXAM: PORTABLE CHEST - 1 VIEW  COMPARISON:  05/06/2014  FINDINGS: There is moderate unchanged cardiomegaly. No airspace consolidation is evident. There is no large effusion.  IMPRESSION: Unchanged cardiomegaly. No airspace consolidation or large effusion.   Electronically Signed   By: Andreas Newport M.D.   On: 05/08/2014 05:52    Scheduled Meds: . antiseptic oral rinse  7 mL Mouth Rinse BID  . cefTRIAXone (ROCEPHIN)  IV  2 g Intravenous Q24H  . febuxostat  80 mg Oral Daily  . folic acid  1 mg Oral Daily  . folic acid  1 mg Intravenous Daily  . hydrALAZINE  20 mg Intravenous Q4H  . insulin aspart  0-9 Units Subcutaneous Q4H  . metoprolol  5 mg Intravenous Once  . multivitamin with minerals  1 tablet Oral Daily  . thiamine  100 mg Oral Daily   Or  . thiamine  100 mg Intravenous Daily  . vancomycin  1,250 mg Intravenous Q12H   Continuous Infusions: . dextrose 5 % and 0.45% NaCl 1,000 mL with potassium chloride 20 mEq infusion 50 mL/hr at 05/08/14 2229  . diltiazem (CARDIZEM) infusion 5 mg/hr (05/09/14 1300)    Principal Problem:   Sepsis Active Problems:   Streptococcal bacteremia   Acute encephalopathy   Fever   Psoas abscess   New onset atrial fibrillation   Diabetes mellitus type 2, controlled   Essential hypertension   Gout   Psoas abscess, right   Iliopsoas abscess on right   Atrial fibrillation with rapid ventricular response   Alcoholism   Type 2 diabetes mellitus without complication   Pyrexia   Abscess of male pelvis   Right hip pain    Time spent: 70 mins    [facility]  MD [facility] Pager  [phone]. If 7PM-7AM, please contact night-coverage at [email or web address], password [facility] 05/09/2014, 3:45 PM  LOS: 5 days

## 2014-05-09 NOTE — Progress Notes (Signed)
Subjective: Pt con't confused overnight.  Objective: Vital signs in last 24 hours: Temp:  [97.7 F (36.5 C)-100.5 F (38.1 C)] 97.7 F (36.5 C) (05/04 0400) Pulse Rate:  [46-167] 105 (05/04 0700) Resp:  [23-50] 23 (05/04 0700) BP: (123-205)/(58-101) 143/76 mmHg (05/04 0700) SpO2:  [89 %-98 %] 94 % (05/04 0700) Last BM Date: 05/07/14  Intake/Output from previous day: 05/03 0701 - 05/04 0700 In: 2369.8 [I.V.:1419.8; IV Piggyback:950] Out: 2675 [Urine:2675] Intake/Output this shift:    General appearance: confused GI: benign abdominal exam  Lab Results:   Recent Labs  05/08/14 0340 05/09/14 0400  WBC 14.4* 18.3*  HGB 13.0 14.1  HCT 38.6* 42.0  PLT 239 349   BMET  Recent Labs  05/08/14 1805 05/09/14 0400  NA 141 140  K 2.4* 3.1*  CL 108 109  CO2 24 23  GLUCOSE 153* 184*  BUN 13 17  CREATININE 0.73 0.66  CALCIUM 8.1* 7.8*   PT/INR No results for input(s): LABPROT, INR in the last 72 hours. ABG  Recent Labs  05/06/14 1641 05/07/14 2359  PHART 7.551* 7.543*  HCO3 21.4 21.7    Studies/Results: Ct Aspiration  05/07/2014   CLINICAL DATA:  Right hip and pelvic pain, fever. Right psoas process on recent CT. Recent aspiration was nondiagnostic.  EXAM: CT GUIDED NEEDLE ASPIRATE BIOPSY OF RIGHT PSOAS PROCESS  PROCEDURE: The procedure risks, benefits, and alternatives were explained to the family. Questions regarding the procedure were encouraged and answered. The family understands and consents to the procedure.  Select axial scans through the lower pelvis were obtained and the collection was localized. An appropriate skin entry site was determined and marked.  The operative field was prepped with Betadinein a sterile fashion, and a sterile drape was applied covering the operative field. A sterile gown and sterile gloves were used for the procedure. Local anesthesia was provided with 1% Lidocaine.  Under CT fluoroscopic guidance, an 18 gauge trocar needle was  advanced into the lesion. Once needle tip position was confirmed, aspiration returned only 5 mL of bloody purulent material. The patient tolerated the procedure well.  COMPLICATIONS: None immediate  FINDINGS: Asymmetric enlargement right psoas musculature. Aspiration of the right psoas process returned 5 mL of bloody purulent material. This was sent for Gram stain, culture and sensitivity.  IMPRESSION: Aspiration of right psoas process returns 5 mL bloody purulent material, sent for Gram stain, culture and sensitivity.   Electronically Signed   By: Lucrezia Europe M.D.   On: 05/07/2014 16:40   Dg Chest Port 1 View  05/08/2014   CLINICAL DATA:  Dyspnea.  Unresponsive.  EXAM: PORTABLE CHEST - 1 VIEW  COMPARISON:  05/06/2014  FINDINGS: There is moderate unchanged cardiomegaly. No airspace consolidation is evident. There is no large effusion.  IMPRESSION: Unchanged cardiomegaly. No airspace consolidation or large effusion.   Electronically Signed   By: Andreas Newport M.D.   On: 05/08/2014 05:52    Anti-infectives: Anti-infectives    Start     Dose/Rate Route Frequency Ordered Stop   05/08/14 1600  vancomycin (VANCOCIN) 1,250 mg in sodium chloride 0.9 % 250 mL IVPB     1,250 mg 166.7 mL/hr over 90 Minutes Intravenous Every 12 hours 05/08/14 0629     05/05/14 0500  vancomycin (VANCOCIN) IVPB 750 mg/150 ml premix  Status:  Discontinued     750 mg 150 mL/hr over 60 Minutes Intravenous Every 12 hours 05/04/14 1642 05/08/14 0629   05/04/14 2300  piperacillin-tazobactam (ZOSYN) IVPB 3.375  g     3.375 g 12.5 mL/hr over 240 Minutes Intravenous Every 8 hours 05/04/14 1642     05/04/14 1645  vancomycin (VANCOCIN) IVPB 1000 mg/200 mL premix     1,000 mg 200 mL/hr over 60 Minutes Intravenous NOW 05/04/14 1642 05/04/14 1900   05/04/14 1615  piperacillin-tazobactam (ZOSYN) IVPB 3.375 g     3.375 g 100 mL/hr over 30 Minutes Intravenous  Once 05/04/14 1603 05/04/14 1900   05/04/14 1615  vancomycin (VANCOCIN) IVPB  1000 mg/200 mL premix     1,000 mg 200 mL/hr over 60 Minutes Intravenous  Once 05/04/14 1603 05/04/14 1900      Assessment/Plan: Small Iliopsoas abscess Encephalopathy Cx pending from iliopsoas abscess. Con't abx.  Little to add from abdominal standpoint. Pt likely in EtOH w/d due to his etoh h/o 3 vodkas/d according to RN   LOS: 5 days    Rosario Jacks., [facility] 05/09/2014

## 2014-05-09 NOTE — Consult Note (Addendum)
[facility] for [facility]     Reason for Consult: psoas abscess    Referring Physician: Dr. Grandville Silos  Principal Problem:   Sepsis Active Problems:   Fever   Psoas abscess   New onset atrial fibrillation   Diabetes mellitus type 2, controlled   Essential hypertension   Gout   Psoas abscess, right   Iliopsoas abscess on right   Atrial fibrillation with rapid ventricular response   Alcoholism   Type 2 diabetes mellitus without complication   Pyrexia   Abscess of male pelvis   Right hip pain   Streptococcal bacteremia   . antiseptic oral rinse  7 mL Mouth Rinse BID  . febuxostat  80 mg Oral Daily  . folic acid  1 mg Oral Daily  . folic acid  1 mg Intravenous Daily  . hydrALAZINE  20 mg Intravenous Q4H  . insulin aspart  0-9 Units Subcutaneous Q4H  . metoprolol  5 mg Intravenous Once  . multivitamin with minerals  1 tablet Oral Daily  . piperacillin-tazobactam (ZOSYN)  IV  3.375 g Intravenous Q8H  . potassium chloride  10 mEq Intravenous Q1 Hr x 5  . thiamine  100 mg Oral Daily   Or  . thiamine  100 mg Intravenous Daily  . vancomycin  1,250 mg Intravenous Q12H    Recommendations: Can narrow antibiotics to vancomycin with ceftriaxone pending ID of abscess (not likely Pseudomonas) Will need outpatient colonoscopy with positive Strep bovis I agree with scanning hip with increasing WBC, though this certainly could be due to DTs  Repeat blood cultures done  Assessment: He came in with pain in right flank and hip and found to have a psoas fluid abscess.  WBC has increased and febrile yesterday but also likely some element of delirium tremons.  Blood culture with Strep bovis in 1/2.  Culture in abscess pending.  Strep bovis is unusual cause of abscess, can be associated with endocarditis but could be transient and not significant (would still need colonoscopy).    Antibiotics: Vancomycin and zosyn  HPI: [name] is a 79 y.o. male with chronic  alcoholism, DM, HTN and gout came in with complaint of pain in right abdomen and hip.  History of hip replacement in right.  Was febrile and also noted to be in Afib.  CT revealed iliopsoas abscess which was drained and blood cultures with 1/2 Strep Group D.  Also has been somnolent and requiring ativan for presumed DTs.  High BPs and tachycardic due to the Afib.     Review of Systems: A comprehensive review of systems was negative.  Past Medical History  Diagnosis Date  . Hypertension   . Arthritis     History  Substance Use Topics  . Smoking status: Former Smoker -- 25.00 packs/day    Types: Cigarettes  . Smokeless tobacco: Never Used  . Alcohol Use: 0.0 oz/week    0 Standard drinks or equivalent per week     Comment: 3 glasses of vodka/day    Family History  Problem Relation Age of Onset  . Diabetes Mellitus II Daughter    Allergies  Allergen Reactions  . Allopurinol Other (See Comments)    Makes head 'feel funny'  . Motrin [Ibuprofen] Other (See Comments)    Blisters in groin area    OBJECTIVE: Blood pressure 143/76, pulse 105, temperature 97.6 F (36.4 C), temperature source Axillary, resp. rate 23, height 5\' 7"  (1.702 m), weight 223 lb 5.2 oz (  101.3 kg), SpO2 94 %. General: sleepy but arousable Skin: no rashes Lungs: CTA B Cor: Tachy RR  Abdomen: soft, nt, nd Ext: 1+ edema Neuro: he does respond to questions and answers some questions appropriately  Microbiology: Recent Results (from the past 240 hour(s))  Blood Culture (routine x 2)     Status: None (Preliminary result)   Collection Time: 05/04/14  3:00 PM  Result Value Ref Range Status   Specimen Description BLOOD LEFT WRIST  5 ML IN [facility] BOTTLE  Final   Special Requests NONE  Final   Culture   Final           BLOOD CULTURE RECEIVED NO GROWTH TO DATE CULTURE WILL BE HELD FOR 5 DAYS BEFORE ISSUING A FINAL NEGATIVE REPORT Performed at [facility]    Report Status PENDING  Incomplete  Blood  Culture (routine x 2)     Status: None   Collection Time: 05/04/14  4:21 PM  Result Value Ref Range Status   Specimen Description BLOOD RIGHT ANTECUBITAL  Final   Special Requests BOTTLES DRAWN AEROBIC AND ANAEROBIC 5CC  Final   Culture   Final    STREPTOCOCCUS GROUP D;high probability for S.bovis Note: Gram Stain Report Called to,Read Back By and Verified With: CAROLYN OAKLEY 4.30.16 BY MANGR 503PM Performed at [facility]    Report Status 05/08/2014 FINAL  Final   Organism ID, Bacteria STREPTOCOCCUS GROUP D;high probability for S.bovis  Final      Susceptibility   Streptococcus group d;high probability for s.bovis - MIC (ETEST)*    PENICILLIN .064 SENSITIVE Sensitive     * STREPTOCOCCUS GROUP D;high probability for S.bovis  Urine culture     Status: None   Collection Time: 05/04/14  5:51 PM  Result Value Ref Range Status   Specimen Description URINE, CLEAN CATCH  Final   Special Requests NONE  Final   Colony Count   Final    7,000 COLONIES/ML Performed at [facility]    Culture   Final    INSIGNIFICANT GROWTH Performed at [facility]    Report Status 05/05/2014 FINAL  Final  MRSA PCR Screening     Status: None   Collection Time: 05/04/14 11:24 PM  Result Value Ref Range Status   MRSA by PCR NEGATIVE NEGATIVE Final    Comment:        The GeneXpert MRSA Assay (FDA approved for NASAL specimens only), is one component of a comprehensive MRSA colonization surveillance program. It is not intended to diagnose MRSA infection nor to guide or monitor treatment for MRSA infections.   Culture, routine-abscess     Status: None (Preliminary result)   Collection Time: 05/07/14  2:29 PM  Result Value Ref Range Status   Specimen Description ABSCESS  Final   Special Requests Normal  Final   Gram Stain   Final    ABUNDANT WBC PRESENT,BOTH PMN AND MONONUCLEAR NO SQUAMOUS EPITHELIAL CELLS SEEN NO ORGANISMS SEEN Performed at [facility]     Culture   Final    Culture reincubated for better growth Performed at [facility]    Report Status PENDING  Incomplete    Dabria Wadas, Herbie [place], [place] [facility] [email or web address] [phone] pager  [phone] cell 05/09/2014, 2:24 PM

## 2014-05-09 NOTE — Consult Note (Signed)
Name: [name] MRN: [id number] DOB: [birth year]    ADMISSION DATE:  05/04/2014 CONSULTATION DATE:  05/09/2014  REFERRING MD :  Grandville Silos  CHIEF COMPLAINT:  AMS   HISTORY OF PRESENT ILLNESS:  79 year old with diabetes chronic alcohol abuse presented on 4/29 pain in that right hip and right lower quadrant of abdomen. CT pelvis showed possible abscess in the psoas muscle-surgery and orthopedics was consulted. He has a history of right hip replacement. Bloody purulent material was aspirated from the right psoas muscle on 5/2. Course was complicated by atrial fibrillation and rapid ventricular rate requiring rate control with Cardizem. He developed withdrawal symptoms and was given Ativan per CIWA protocol. [facility] M consulted on 5/4 for acute encephalopathy and lethargy with inability to clear secretions. He received 2 mg of Ativan at 9:45 AM and another 1 mg around midnight   SIGNIFICANT EVENTS  4/30 -AF-RVR >> cardizem gtt 5/1 CT pelvis >>dense collection within the right psoas musculature 5/2 CT aspiration >> 5 mL of bloody purulent material   STUDIES:  5/1 echo >>nml LV fn, RVSP 41 4/29 blood culture >> strep group D 5/2 abscess culture >>    PAST MEDICAL HISTORY :   has a past medical history of Hypertension and Arthritis.  has past surgical history that includes Hip Arthroplasty (Right); Joint replacement; and right cataract extraction. Prior to Admission medications   Medication Sig Start Date End Date Taking? Authorizing Provider  atenolol (TENORMIN) 50 MG tablet Take 50 mg by mouth daily. 04/16/14  Yes Historical Provider, MD  celecoxib (CELEBREX) 200 MG capsule Take 200 mg by mouth 2 (two) times daily. 02/26/14  Yes Historical Provider, MD  CRESTOR 10 MG tablet Take 10 mg by mouth daily. Takes one every other day 03/05/14  Yes Historical Provider, MD  furosemide (LASIX) 20 MG tablet Take 20 mg by mouth daily. 02/21/14  Yes Historical Provider, MD  LYRICA 75 MG capsule Take 75 mg  by mouth 2 (two) times daily. 75mg  in the morning and 150mg  at night 04/27/14  Yes Historical Provider, MD  metFORMIN (GLUCOPHAGE) 500 MG tablet Take 1,000 mg by mouth. 02/24/14  Yes Historical Provider, MD  traMADol (ULTRAM) 50 MG tablet Take 100 mg by mouth. Every 4 hours 04/10/14  Yes Historical Provider, MD  ULORIC 80 MG TABS Take 1 tablet by mouth daily. 02/22/14  Yes Historical Provider, MD   Allergies  Allergen Reactions  . Allopurinol Other (See Comments)    Makes head 'feel funny'  . Motrin [Ibuprofen] Other (See Comments)    Blisters in groin area    FAMILY HISTORY:  family history includes Diabetes Mellitus II in his daughter. SOCIAL HISTORY:  reports that he has quit smoking. His smoking use included Cigarettes. He smoked 25.00 packs per day. He has never used smokeless tobacco. He reports that he drinks alcohol. He reports that he does not use illicit drugs.  REVIEW OF SYSTEMS:  Unable to obtain SUBJECTIVE:   VITAL SIGNS: Temp:  [97.6 F (36.4 C)-100.5 F (38.1 C)] 97.6 F (36.4 C) (05/04 0800) Pulse Rate:  [93-167] 105 (05/04 0700) Resp:  [23-50] 23 (05/04 0700) BP: (123-205)/(58-101) 143/76 mmHg (05/04 0700) SpO2:  [89 %-98 %] 94 % (05/04 0700)  PHYSICAL EXAMINATION: General:  Chronically ill-appearing, intermittent agitation and continuous moaning and groaning Neuro:  RASS -1 to +2 HEENT:  Edentulous, no JVD, oral secretions Cardiovascular:  S1-S2 regular Lungs:  Decreased breath sounds bilateral, scattered rhonchi Abdomen:  Soft and nontender Musculoskeletal:  No edema, abrasions or legs Skin:  Warm no rash   Recent Labs Lab 05/08/14 0340 05/08/14 1805 05/09/14 0400  NA 137 141 140  K 2.6* 2.4* 3.1*  CL 110 108 109  CO2 22 24 23   BUN 14 13 17   CREATININE 0.73 0.73 0.66  GLUCOSE 171* 153* 184*    Recent Labs Lab 05/05/14 0500 05/08/14 0340 05/09/14 0400  HGB 13.6 13.0 14.1  HCT 39.0 38.6* 42.0  WBC 9.8 14.4* 18.3*  PLT 206 239 349   Ct  Aspiration  05/07/2014   CLINICAL DATA:  Right hip and pelvic pain, fever. Right psoas process on recent CT. Recent aspiration was nondiagnostic.  EXAM: CT GUIDED NEEDLE ASPIRATE BIOPSY OF RIGHT PSOAS PROCESS  PROCEDURE: The procedure risks, benefits, and alternatives were explained to the family. Questions regarding the procedure were encouraged and answered. The family understands and consents to the procedure.  Select axial scans through the lower pelvis were obtained and the collection was localized. An appropriate skin entry site was determined and marked.  The operative field was prepped with Betadinein a sterile fashion, and a sterile drape was applied covering the operative field. A sterile gown and sterile gloves were used for the procedure. Local anesthesia was provided with 1% Lidocaine.  Under CT fluoroscopic guidance, an 18 gauge trocar needle was advanced into the lesion. Once needle tip position was confirmed, aspiration returned only 5 mL of bloody purulent material. The patient tolerated the procedure well.  COMPLICATIONS: None immediate  FINDINGS: Asymmetric enlargement right psoas musculature. Aspiration of the right psoas process returned 5 mL of bloody purulent material. This was sent for Gram stain, culture and sensitivity.  IMPRESSION: Aspiration of right psoas process returns 5 mL bloody purulent material, sent for Gram stain, culture and sensitivity.   Electronically Signed   By: Lucrezia Europe M.D.   On: 05/07/2014 16:40   Dg Chest Port 1 View  05/08/2014   CLINICAL DATA:  Dyspnea.  Unresponsive.  EXAM: PORTABLE CHEST - 1 VIEW  COMPARISON:  05/06/2014  FINDINGS: There is moderate unchanged cardiomegaly. No airspace consolidation is evident. There is no large effusion.  IMPRESSION: Unchanged cardiomegaly. No airspace consolidation or large effusion.   Electronically Signed   By: Andreas Newport M.D.   On: 05/08/2014 05:52    ASSESSMENT / PLAN:  Acute encephalopathy-related to alcohol  withdrawal and Ativan administration . Strep bovis bacteremia   hypokalemia and hypomagnesemia   atrial fibrillation -RVR    recommend - hold Ativan until mental status improves, then use lower doses. He is able to protect his airway right now- may need oral suctioning prn Replete potassium and magnesium aggressively He may need scanning of the right hip to ensure no joint involvement -defer to primary team, may need to review with radiology first.    Kara Mead MD. [facility]. [place] [facility] Pager [phone] If no response call [phone]     05/09/2014, 12:16 PM

## 2014-05-09 NOTE — Progress Notes (Signed)
ANTIBIOTIC CONSULT NOTE - FOLLOW UP  Pharmacy Consult for Vancomycin and Ceftriaxone Indication: rule out sepsis, psoas abscess  Allergies  Allergen Reactions  . Allopurinol Other (See Comments)    Makes head 'feel funny'  . Motrin [Ibuprofen] Other (See Comments)    Blisters in groin area    Patient Measurements: Height: 5\' 7"  (170.2 cm) Weight: 223 lb 5.2 oz (101.3 kg) IBW/kg (Calculated) : 66.1  Vital Signs: Temp: 98 F (36.7 C) (05/04 1200) Temp Source: Axillary (05/04 1200) BP: 143/76 mmHg (05/04 0700) Pulse Rate: 105 (05/04 0700) Intake/Output from previous day: 05/03 0701 - 05/04 0700 In: 2369.8 [I.V.:1419.8; IV Piggyback:950] Out: 2675 [Urine:2675] Intake/Output from this shift: Total I/O In: -  Out: 300 [Urine:300]  Labs:  Recent Labs  05/08/14 0340 05/08/14 1805 05/09/14 0400  WBC 14.4*  --  18.3*  HGB 13.0  --  14.1  PLT 239  --  349  CREATININE 0.73 0.73 0.66   Estimated Creatinine Clearance: 87.7 mL/min (by C-G formula based on Cr of 0.66).  Recent Labs  05/07/14 1725  VANCOTROUGH 8*     Assessment: 79 y.o M presents to the ED with c/o fatigue and "dark urine" for the past couple of days. LA elevated at 2.38.  Pharmacy was initially consulted to dose broad abx with vancomycin and zosyn for suspected sepsis, concern for psoas abscess.  Now, narrowed by ID to Vancomycin and Ceftriaxone as Pseudomonas is unlikely.  4/29 >> vanc>> 4/29 >> zosyn>> 5/4 5/4 >> ceftriaxone >>  Today, 05/09/2014: Tmax: 100.5 WBC: remain elevated, 18.3 Renal: SCr 0.66, CrCl ~ 87 ml/min   Goal of Therapy:  Vancomycin trough level 15-20 mcg/ml  Appropriate abx dosing, eradication of infection.   Plan:   Ceftriaxone 2g IV q24h  Continue vancomycin 1250 mg IV q12h Recheck Vanc trough as needed.   Follow up renal fxn, culture results, and clinical course.  Gretta Arab PharmD, BCPS Pager [phone] 05/09/2014 2:43 PM

## 2014-05-10 ENCOUNTER — Inpatient Hospital Stay (HOSPITAL_COMMUNITY): Payer: Medicare Other

## 2014-05-10 DIAGNOSIS — B952 Enterococcus as the cause of diseases classified elsewhere: Secondary | ICD-10-CM

## 2014-05-10 LAB — CBC
HCT: 42.1 % (ref 39.0–52.0)
Hemoglobin: 14.3 g/dL (ref 13.0–17.0)
MCH: 32.9 pg (ref 26.0–34.0)
MCHC: 34 g/dL (ref 30.0–36.0)
MCV: 96.8 fL (ref 78.0–100.0)
PLATELETS: 344 10*3/uL (ref 150–400)
RBC: 4.35 MIL/uL (ref 4.22–5.81)
RDW: 14.2 % (ref 11.5–15.5)
WBC: 15.1 10*3/uL — AB (ref 4.0–10.5)

## 2014-05-10 LAB — BASIC METABOLIC PANEL
Anion gap: 9 (ref 5–15)
BUN: 18 mg/dL (ref 6–20)
CHLORIDE: 111 mmol/L (ref 101–111)
CO2: 21 mmol/L — ABNORMAL LOW (ref 22–32)
CREATININE: 0.59 mg/dL — AB (ref 0.61–1.24)
Calcium: 8.1 mg/dL — ABNORMAL LOW (ref 8.9–10.3)
Glucose, Bld: 156 mg/dL — ABNORMAL HIGH (ref 70–99)
POTASSIUM: 3.4 mmol/L — AB (ref 3.5–5.1)
Sodium: 141 mmol/L (ref 135–145)

## 2014-05-10 LAB — GLUCOSE, CAPILLARY
GLUCOSE-CAPILLARY: 148 mg/dL — AB (ref 70–99)
GLUCOSE-CAPILLARY: 153 mg/dL — AB (ref 70–99)
GLUCOSE-CAPILLARY: 154 mg/dL — AB (ref 70–99)
GLUCOSE-CAPILLARY: 165 mg/dL — AB (ref 70–99)
Glucose-Capillary: 147 mg/dL — ABNORMAL HIGH (ref 70–99)
Glucose-Capillary: 148 mg/dL — ABNORMAL HIGH (ref 70–99)
Glucose-Capillary: 139 mg/dL — ABNORMAL HIGH (ref 70–99)

## 2014-05-10 LAB — CULTURE, BLOOD (ROUTINE X 2): Culture: NO GROWTH

## 2014-05-10 LAB — VANCOMYCIN, TROUGH: VANCOMYCIN TR: 15 ug/mL (ref 10.0–20.0)

## 2014-05-10 LAB — MAGNESIUM: Magnesium: 1.9 mg/dL (ref 1.7–2.4)

## 2014-05-10 MED ORDER — POTASSIUM CHLORIDE 10 MEQ/100ML IV SOLN: 10.0000 meq | INTRAVENOUS | Status: DC

## 2014-05-10 MED ORDER — IOHEXOL 300 MG/ML  SOLN
100.0000 mL | Freq: Once | INTRAMUSCULAR | Status: AC | PRN
  Administered 2014-05-10: 100 mL via INTRAVENOUS

## 2014-05-10 MED ORDER — POTASSIUM CHLORIDE 2 MEQ/ML IV SOLN
INTRAVENOUS | Status: DC
  Administered 2014-05-10 – 2014-05-16 (×10): via INTRAVENOUS
  Filled 2014-05-10 (×18): qty 1000

## 2014-05-10 MED ORDER — POTASSIUM CHLORIDE 10 MEQ/100ML IV SOLN
10.0000 meq | INTRAVENOUS | Status: AC
  Administered 2014-05-10 (×3): 10 meq via INTRAVENOUS
  Filled 2014-05-10 (×3): qty 100

## 2014-05-10 MED ORDER — SODIUM CHLORIDE 0.9 % IV BOLUS (SEPSIS)
500.0000 mL | Freq: Once | INTRAVENOUS | Status: AC
  Administered 2014-05-10: 500 mL via INTRAVENOUS

## 2014-05-10 NOTE — Progress Notes (Addendum)
[facility] PROGRESS NOTE  ZELIG GACEK TWS:[id number] DOB: [birth year] DOA: 05/04/2014 PCP: Lujean Amel, MD  Assessment/Plan: #1 sepsis Patient on presentation had a temperature 101.5  increased heart rate and new onset A. Fib in the presence of infection. Blood cultures with 1 out of 2 growing streptococcal bovis. Right iliopsoas has been aspirated and cultures are pending. Patient still with fevers. Continue empiric IV vancomycin and IV Rocephin. Patient has been seen by ID.  #2 acute encephalopathy Questionable etiology. May be metabolic in nature secondary to acute infection with psoas abscess and probable streptococcal bovis bacteremia and probable alcohol withdrawal. Patient with slight improvement with mentation, opens eyes to verbal stimuli. Suction patient. D/C'd CIWA protocol. Continue Ativan 1 mg every 4 hours as needed. Appreciate critical care consultation. Continue empiric IV antibiotics. Follow.  #3 streptococcal bovis bacteremia Likely seeded from the GI tract. Patient has been seen in consultation by infectious disease and recommended narrowing antibiotics. Concern also for colorectal malignancy. CT of the abdomen and pelvis pending. Patient will likely need a colonoscopy as outpatient. Patient currently on IV vancomycin and IV Rocephin. ID following and appreciate input and recommendations.  #4 iliopsoas abscess Status post aspiration. Cultures are pending. Continue empiric IV vancomycin and IV Rocephin. Follow.  #5 fever Likely secondary to problems #2 and 3. Concern for possible hip infection. CT abdomen and pelvis pending to evaluate hip and psoas. Continue empiric IV antibiotics for now. ID following.  #6 New Onset atrial fibrillation Likely secondary to iliopsoas abscess and bacteremia.  TSH within normal limits. HR 120s this morning. 2-D echo with EF of 60-65% with moderate LVH, aortic sclerosis with trivial AI, mild RAE, moderate MR and TR, dilated IVC. Patient  currently on a Cardizem drip. Once more alert may transition to oral long-acting Cardizem for rate control. Patient with a CHADSVASC score 4. Patient likely needs anticoagulation however due to history of chronic alcoholism question of compliance. Cardiology following and appreciate input and recommendations.  #7 hypertension Currently on Cardizem. When patient is more alert will transition to long-acting Cardizem.  #8 hypokalemia/hypomagnesemia Replete.  #9 dm 2 Currently NPO secondary to altered MS. CBGs have ranged from 127-148.sliding scale insulin.  #10 history of alcohol abuse Patient slightly more responsive, opens eyes to verbal stimuli. D/C'd CIWA protocol. Ativan as needed for agitation. Appreciate critical care input.  #11 prophylaxis SCDs for DVT prophylaxis.  Code Status: full Family Communication: Updated daughter via phone [facility] [phone]) Disposition Plan: remain in the step down unit.   Consultants:  Gen. Surgery: Dr. Lucia Gaskins 05/04/2014  Cardiology: Dr. Jacinta Shoe 05/05/2014  Orthopedics: Dr. Berenice Primas 05/08/2014  PCCM: Dr. Elsworth Soho 05/09/2014  Infectious disease: Dr. Linus Salmons 05/09/2014  Procedures:   CT pelvis 05/04/2014   plain films of the right hip and pelvis 05/04/2014   2-D echo 05/06/2014  Chest x-ray 05/08/2014, 05/06/2014, 05/04/2014  X-ray of the right knee 05/04/2014  CT aspiration 5 mL of bloody purulent material 05/07/2014  Antibiotics:   IV vancomycin   05/04/2014  IV Zosyn  05/04/2014>>>> 05/09/2014   IV Rocephin 05/09/2014  HPI/Subjective:  patient minimally responsive to verbal stimuli, and opens eyes.   Objective: Filed Vitals:   05/10/14 0836  BP: 148/86  Pulse: 120  Temp:   Resp:     Intake/Output Summary (Last 24 hours) at 05/10/14 1011 Last data filed at 05/10/14 0600  Gross per 24 hour  Intake 2173.04 ml  Output   1125 ml  Net 1048.04 ml   [employer]  05/04/14 1617 05/04/14 2319 05/10/14 0400  Weight:  102.059 kg (225 lb) 101.3 kg (223 lb 5.2 oz) 100.7 kg (222 lb 0.1 oz)    Exam:   General:   Patient opens eyes to verbal stimuli. Dry mucous membranes.  Cardiovascular:  Irregularly irregular.  Respiratory:  Some  coarse sounds in anterior lung fields.  Abdomen:  Soft, nontender, nondistended, positive bowel sounds.  Musculoskeletal:  No clubbing cyanosis or edema.  Data Reviewed: Basic Metabolic Panel:  Recent Labs Lab 05/05/14 0500 05/07/14 1725 05/08/14 0340 05/08/14 1805 05/09/14 0400 05/09/14 0915 05/10/14 0345  NA 134*  --  137 141 140  --  141  K 3.6  --  2.6* 2.4* 3.1*  --  3.4*  CL 99  --  110 108 109  --  111  CO2 24  --  22 24 23   --  21*  GLUCOSE 126*  --  171* 153* 184*  --  156*  BUN 28*  --  14 13 17   --  18  CREATININE 0.83 0.69 0.73 0.73 0.66  --  0.59*  CALCIUM 8.4  --  7.7* 8.1* 7.8*  --  8.1*  MG  --   --   --   --   --  1.3* 1.9  PHOS  --   --   --   --   --  3.3  --    Liver Function Tests:  Recent Labs Lab 05/04/14 1500 05/05/14 0500  AST 152* 167*  ALT 92* 111*  ALKPHOS 111 120*  BILITOT 1.6* 1.8*  PROT 7.4 7.4  ALBUMIN 3.6 3.5   No results for input(s): LIPASE, AMYLASE in the last 168 hours.  Recent Labs Lab 05/09/14 0915  AMMONIA 29   CBC:  Recent Labs Lab 05/04/14 1500 05/05/14 0500 05/08/14 0340 05/09/14 0400 05/10/14 0345  WBC 10.3 9.8 14.4* 18.3* 15.1*  NEUTROABS 8.0* 7.2  --   --   --   HGB 13.0 13.6 13.0 14.1 14.3  HCT 37.7* 39.0 38.6* 42.0 42.1  MCV 95.2 96.3 95.1 97.4 96.8  PLT 180 206 239 349 344   Cardiac Enzymes: No results for input(s): CKTOTAL, CKMB, CKMBINDEX, TROPONINI in the last 168 hours. BNP (last 3 results) No results for input(s): BNP in the last 8760 hours.  ProBNP (last 3 results) No results for input(s): PROBNP in the last 8760 hours.  CBG:  Recent Labs Lab 05/09/14 1344 05/09/14 1613 05/09/14 2011 05/09/14 2330 05/10/14 0756  GLUCAP 158* 155* 127* 147* 148*    Recent  Results (from the past 240 hour(s))  Blood Culture (routine x 2)     Status: None   Collection Time: 05/04/14  3:00 PM  Result Value Ref Range Status   Specimen Description BLOOD LEFT WRIST  5 ML IN [facility] BOTTLE  Final   Special Requests NONE  Final   Culture   Final    NO GROWTH 5 DAYS Performed at [facility]    Report Status 05/10/2014 FINAL  Final  Blood Culture (routine x 2)     Status: None   Collection Time: 05/04/14  4:21 PM  Result Value Ref Range Status   Specimen Description BLOOD RIGHT ANTECUBITAL  Final   Special Requests BOTTLES DRAWN AEROBIC AND ANAEROBIC 5CC  Final   Culture   Final    STREPTOCOCCUS GROUP D;high probability for S.bovis Note: Gram Stain Report Called to,Read Back By and Verified With: CAROLYN OAKLEY 4.30.16 BY MANGR 503PM  Performed at [facility]    Report Status 05/08/2014 FINAL  Final   Organism ID, Bacteria STREPTOCOCCUS GROUP D;high probability for S.bovis  Final      Susceptibility   Streptococcus group d;high probability for s.bovis - MIC (ETEST)*    PENICILLIN .064 SENSITIVE Sensitive     * STREPTOCOCCUS GROUP D;high probability for S.bovis  Urine culture     Status: None   Collection Time: 05/04/14  5:51 PM  Result Value Ref Range Status   Specimen Description URINE, CLEAN CATCH  Final   Special Requests NONE  Final   Colony Count   Final    7,000 COLONIES/ML Performed at [facility]    Culture   Final    INSIGNIFICANT GROWTH Performed at [facility]    Report Status 05/05/2014 FINAL  Final  MRSA PCR Screening     Status: None   Collection Time: 05/04/14 11:24 PM  Result Value Ref Range Status   MRSA by PCR NEGATIVE NEGATIVE Final    Comment:        The GeneXpert MRSA Assay (FDA approved for NASAL specimens only), is one component of a comprehensive MRSA colonization surveillance program. It is not intended to diagnose MRSA infection nor to guide or monitor treatment for MRSA infections.    Culture, routine-abscess     Status: None (Preliminary result)   Collection Time: 05/07/14  2:29 PM  Result Value Ref Range Status   Specimen Description ABSCESS  Final   Special Requests Normal  Final   Gram Stain   Final    ABUNDANT WBC PRESENT,BOTH PMN AND MONONUCLEAR NO SQUAMOUS EPITHELIAL CELLS SEEN NO ORGANISMS SEEN Performed at [facility]    Culture   Final    Culture reincubated for better growth Performed at [facility]    Report Status PENDING  Incomplete  Culture, blood (routine x 2)     Status: None (Preliminary result)   Collection Time: 05/08/14  7:00 PM  Result Value Ref Range Status   Specimen Description BLOOD RIGHT HAND  Final   Special Requests BOTTLES DRAWN AEROBIC AND ANAEROBIC 10CC  Final   Culture   Final           BLOOD CULTURE RECEIVED NO GROWTH TO DATE CULTURE WILL BE HELD FOR 5 DAYS BEFORE ISSUING A FINAL NEGATIVE REPORT Performed at [facility]    Report Status PENDING  Incomplete  Culture, blood (routine x 2)     Status: None (Preliminary result)   Collection Time: 05/08/14  7:15 PM  Result Value Ref Range Status   Specimen Description BLOOD RIGHT ARM  Final   Special Requests BOTTLES DRAWN AEROBIC AND ANAEROBIC 10CC  Final   Culture   Final           BLOOD CULTURE RECEIVED NO GROWTH TO DATE CULTURE WILL BE HELD FOR 5 DAYS BEFORE ISSUING A FINAL NEGATIVE REPORT Performed at [facility]    Report Status PENDING  Incomplete     Studies: No results found.  Scheduled Meds: . antiseptic oral rinse  7 mL Mouth Rinse BID  . cefTRIAXone (ROCEPHIN)  IV  2 g Intravenous Q24H  . febuxostat  80 mg Oral Daily  . folic acid  1 mg Oral Daily  . folic acid  1 mg Intravenous Daily  . hydrALAZINE  20 mg Intravenous Q4H  . insulin aspart  0-9 Units Subcutaneous Q4H  . metoprolol  5 mg Intravenous Once  .  multivitamin with minerals  1 tablet Oral Daily  . potassium chloride  10 mEq Intravenous Q1 Hr x 3  . thiamine   100 mg Oral Daily   Or  . thiamine  100 mg Intravenous Daily  . vancomycin  1,250 mg Intravenous Q12H   Continuous Infusions: . dextrose 5 % and 0.45% NaCl 1,000 mL with potassium chloride 20 mEq infusion 50 mL/hr at 05/08/14 2229  . diltiazem (CARDIZEM) infusion 7.5 mg/hr (05/10/14 0851)    Principal Problem:   Sepsis Active Problems:   Streptococcal bacteremia   Acute encephalopathy   Fever   Psoas abscess   New onset atrial fibrillation   Diabetes mellitus type 2, controlled   Essential hypertension   Gout   Psoas abscess, right   Iliopsoas abscess on right   Atrial fibrillation with rapid ventricular response   Alcoholism   Type 2 diabetes mellitus without complication   Pyrexia   Abscess of male pelvis   Right hip pain    Time spent: 9 mins    [facility]  MD [facility] Pager [phone]. If 7PM-7AM, please contact night-coverage at [email or web address], password [facility] 05/10/2014, 10:11 AM  LOS: 6 days

## 2014-05-10 NOTE — Progress Notes (Signed)
   Name: [name] MRN: [id number] DOB: [birth year]    ADMISSION DATE:  05/04/2014 CONSULTATION DATE:  05/10/2014  REFERRING MD :  Grandville Silos  CHIEF COMPLAINT:  AMS   HISTORY OF PRESENT ILLNESS:  79 year old with diabetes chronic alcohol abuse presented on 4/29 pain in that right hip and right lower quadrant of abdomen. CT pelvis showed possible abscess in the psoas muscle-surgery and orthopedics was consulted. He has a history of right hip replacement. Bloody purulent material was aspirated from the right psoas muscle on 5/2. Course was complicated by atrial fibrillation and rapid ventricular rate requiring rate control with Cardizem. He developed withdrawal symptoms and was given Ativan per CIWA protocol. [facility] M consulted on 5/4 for acute encephalopathy and lethargy with inability to clear secretions. He received 2 mg of Ativan at 9:45 AM and another 1 mg around midnight   SIGNIFICANT EVENTS  4/30 -AF-RVR >> cardizem gtt 5/1 CT pelvis >>dense collection within the right psoas musculature 5/2 CT aspiration >> 5 mL of bloody purulent material   STUDIES:  5/1 echo >>nml LV fn, RVSP 41 4/29 blood culture >> strep group D 5/2 abscess culture >>  SUBJECTIVE: Afebrile Remains poorly responsive  VITAL SIGNS: Temp:  [98 F (36.7 C)-99.3 F (37.4 C)] 99.3 F (37.4 C) (05/05 0800) Pulse Rate:  [93-126] 116 (05/05 1000) Resp:  [21-41] 31 (05/05 1000) BP: (129-177)/(38-97) 129/74 mmHg (05/05 1000) SpO2:  [92 %-96 %] 94 % (05/05 1000) Weight:  [222 lb 0.1 oz (100.7 kg)] 222 lb 0.1 oz (100.7 kg) (05/05 0400)  PHYSICAL EXAMINATION: General:  Chronically ill-appearing, intermittent agitation Neuro:  RASS -1 to +2, follows 1 step commands HEENT:  Edentulous, no JVD, oral secretions Cardiovascular:  S1-S2 regular Lungs:  Decreased breath sounds bilateral, no rhonchi, weak cough Abdomen:  Soft and nontender Musculoskeletal:  No edema, abrasions or legs Skin:  Warm no rash, scaling over  BLEs   Recent Labs Lab 05/08/14 1805 05/09/14 0400 05/10/14 0345  NA 141 140 141  K 2.4* 3.1* 3.4*  CL 108 109 111  CO2 24 23 21*  BUN 13 17 18   CREATININE 0.73 0.66 0.59*  GLUCOSE 153* 184* 156*    Recent Labs Lab 05/08/14 0340 05/09/14 0400 05/10/14 0345  HGB 13.0 14.1 14.3  HCT 38.6* 42.0 42.1  WBC 14.4* 18.3* 15.1*  PLT 239 349 344   No results found.  ASSESSMENT / PLAN:  Acute encephalopathy-related to alcohol withdrawal and Ativan administration . Strep bovis bacteremia   hypokalemia and hypomagnesemia   atrial fibrillation -RVR    recommend - hold Ativan He is able to protect his airway right now- may need oral suctioning prn Replete potassium and magnesium aggressively He may need scanning of the right hip to ensure no joint involvement -defer to primary team, may need to review with radiology first. Colonoscopy eventually  PCCM to sign off     Kara Mead MD. [facility]. [place] [facility] Pager [phone] If no response call [phone]     05/10/2014, 11:56 AM

## 2014-05-10 NOTE — Progress Notes (Signed)
Date:  May 10, 2014 U.R. performed for needs and level of care. Will continue to follow for Case Management needs.  Rhonda Davis, RN, BSN, CCM   [phone] 

## 2014-05-10 NOTE — Progress Notes (Signed)
ANTIBIOTIC CONSULT NOTE - FOLLOW UP  Pharmacy Consult for Vancomycin and Ceftriaxone Indication: rule out sepsis, psoas abscess  Allergies  Allergen Reactions  . Allopurinol Other (See Comments)    Makes head 'feel funny'  . Motrin [Ibuprofen] Other (See Comments)    Blisters in groin area    Patient Measurements: Height: 5\' 7"  (170.2 cm) Weight: 222 lb 0.1 oz (100.7 kg) IBW/kg (Calculated) : 66.1  Vital Signs: Temp: 99.3 F (37.4 C) (05/05 0800) Temp Source: Oral (05/05 0800) BP: 148/86 mmHg (05/05 0836) Pulse Rate: 120 (05/05 0836) Intake/Output from previous day: 05/04 0701 - 05/05 0700 In: 2173 [I.V.:1323; IV Piggyback:850] Out: 1425 [Urine:1425] Intake/Output from this shift:    Labs:  Recent Labs  05/08/14 0340 05/08/14 1805 05/09/14 0400 05/10/14 0345  WBC 14.4*  --  18.3* 15.1*  HGB 13.0  --  14.1 14.3  PLT 239  --  349 344  CREATININE 0.73 0.73 0.66 0.59*   Estimated Creatinine Clearance: 87.4 mL/min (by C-G formula based on Cr of 0.59).  Recent Labs  05/07/14 1725  VANCOTROUGH 8*     Assessment: 79 y.o M presents to the ED with c/o fatigue and "dark urine" for the past couple of days. LA elevated at 2.38.  Pharmacy was initially consulted to dose broad abx with vancomycin and zosyn for suspected sepsis, concern for psoas abscess.  Now, narrowed by ID to Vancomycin and Ceftriaxone as Pseudomonas is unlikely.  4/29 >> vanc>> 4/29 >> zosyn>> 5/4 5/4 >> ceftriaxone >>  Today, 05/10/2014: Tmax: 99.3 WBC: remain elevated, 15.1 Renal: SCr 0.59, CrCl ~ 87 ml/min   Goal of Therapy:  Vancomycin trough level 15-20 mcg/ml  Appropriate abx dosing, eradication of infection.   Plan:   Continue Ceftriaxone 2g IV q24h  Continue Vancomycin 1250 mg IV q12h Check Vancomycin trough prior to next dose.   Follow up renal fxn, culture results, and clinical course.    Lindell Spar, PharmD, BCPS Pager: [phone] 05/10/2014 10:12 AM

## 2014-05-10 NOTE — Progress Notes (Signed)
[facility] for Infectious Disease  Date of Admission:  05/04/2014  Antibiotics: Vancomycin and ceftriaxone  Subjective: No acute events  Objective: Temp:  [98.1 F (36.7 C)-99.3 F (37.4 C)] 98.1 F (36.7 C) (05/05 1200) Pulse Rate:  [93-223] 135 (05/05 1500) Resp:  [27-39] 31 (05/05 1500) BP: (129-177)/(38-117) 149/117 mmHg (05/05 1500) SpO2:  [92 %-96 %] 96 % (05/05 1500) Weight:  [222 lb 0.1 oz (100.7 kg)] 222 lb 0.1 oz (100.7 kg) (05/05 0400)  General: awake, confused, some moments of lucidity Skin: diffuse blanching confluent erythema Lungs: CTA B Cor: RRR Abdomen: soft, nt  Lab Results Lab Results  Component Value Date   WBC 15.1* 05/10/2014   HGB 14.3 05/10/2014   HCT 42.1 05/10/2014   MCV 96.8 05/10/2014   PLT 344 05/10/2014    Lab Results  Component Value Date   CREATININE 0.59* 05/10/2014   BUN 18 05/10/2014   NA 141 05/10/2014   K 3.4* 05/10/2014   CL 111 05/10/2014   CO2 21* 05/10/2014    Lab Results  Component Value Date   ALT 111* 05/05/2014   AST 167* 05/05/2014   ALKPHOS 120* 05/05/2014   BILITOT 1.8* 05/05/2014      Microbiology: Recent Results (from the past 240 hour(s))  Blood Culture (routine x 2)     Status: None   Collection Time: 05/04/14  3:00 PM  Result Value Ref Range Status   Specimen Description BLOOD LEFT WRIST  5 ML IN [facility] BOTTLE  Final   Special Requests NONE  Final   Culture   Final    NO GROWTH 5 DAYS Performed at [facility]    Report Status 05/10/2014 FINAL  Final  Blood Culture (routine x 2)     Status: None   Collection Time: 05/04/14  4:21 PM  Result Value Ref Range Status   Specimen Description BLOOD RIGHT ANTECUBITAL  Final   Special Requests BOTTLES DRAWN AEROBIC AND ANAEROBIC 5CC  Final   Culture   Final    STREPTOCOCCUS GROUP D;high probability for S.bovis Note: Gram Stain Report Called to,Read Back By and Verified With: CAROLYN OAKLEY 4.30.16 BY MANGR 503PM Performed at [facility]    Report Status 05/08/2014 FINAL  Final   Organism ID, Bacteria STREPTOCOCCUS GROUP D;high probability for S.bovis  Final      Susceptibility   Streptococcus group d;high probability for s.bovis - MIC (ETEST)*    PENICILLIN .064 SENSITIVE Sensitive     * STREPTOCOCCUS GROUP D;high probability for S.bovis  Urine culture     Status: None   Collection Time: 05/04/14  5:51 PM  Result Value Ref Range Status   Specimen Description URINE, CLEAN CATCH  Final   Special Requests NONE  Final   Colony Count   Final    7,000 COLONIES/ML Performed at [facility]    Culture   Final    INSIGNIFICANT GROWTH Performed at [facility]    Report Status 05/05/2014 FINAL  Final  MRSA PCR Screening     Status: None   Collection Time: 05/04/14 11:24 PM  Result Value Ref Range Status   MRSA by PCR NEGATIVE NEGATIVE Final    Comment:        The GeneXpert MRSA Assay (FDA approved for NASAL specimens only), is one component of a comprehensive MRSA colonization surveillance program. It is not intended to diagnose MRSA infection nor to guide or monitor treatment for MRSA infections.  Culture, routine-abscess     Status: None (Preliminary result)   Collection Time: 05/07/14  2:29 PM  Result Value Ref Range Status   Specimen Description ABSCESS  Final   Special Requests Normal  Final   Gram Stain   Final    ABUNDANT WBC PRESENT,BOTH PMN AND MONONUCLEAR NO SQUAMOUS EPITHELIAL CELLS SEEN NO ORGANISMS SEEN Performed at [facility]    Culture   Final    Culture reincubated for better growth Performed at [facility]    Report Status PENDING  Incomplete  Culture, blood (routine x 2)     Status: None (Preliminary result)   Collection Time: 05/08/14  7:00 PM  Result Value Ref Range Status   Specimen Description BLOOD RIGHT HAND  Final   Special Requests BOTTLES DRAWN AEROBIC AND ANAEROBIC 10CC  Final   Culture   Final           BLOOD CULTURE RECEIVED  NO GROWTH TO DATE CULTURE WILL BE HELD FOR 5 DAYS BEFORE ISSUING A FINAL NEGATIVE REPORT Performed at [facility]    Report Status PENDING  Incomplete  Culture, blood (routine x 2)     Status: None (Preliminary result)   Collection Time: 05/08/14  7:15 PM  Result Value Ref Range Status   Specimen Description BLOOD RIGHT ARM  Final   Special Requests BOTTLES DRAWN AEROBIC AND ANAEROBIC 10CC  Final   Culture   Final           BLOOD CULTURE RECEIVED NO GROWTH TO DATE CULTURE WILL BE HELD FOR 5 DAYS BEFORE ISSUING A FINAL NEGATIVE REPORT Performed at [facility]    Report Status PENDING  Incomplete    Studies/Results: Ct Abdomen Pelvis W Contrast  05/10/2014   CLINICAL DATA:  Fever, follow-up iliopsoas abscess. History of right hip replacement.  EXAM: CT ABDOMEN AND PELVIS WITH CONTRAST  TECHNIQUE: Multidetector CT imaging of the abdomen and pelvis was performed using the standard protocol following bolus administration of intravenous contrast.  CONTRAST:  154mL OMNIPAQUE IOHEXOL 300 MG/ML  SOLN  COMPARISON:  CT pelvis dated 05/04/2014  FINDINGS: Motion degraded images.  Lower chest:  Small bilateral pleural effusions.  Cardiomegaly.  Coronary atherosclerosis.  Hepatobiliary: Liver is grossly unremarkable.  Gallbladder is severely motion degraded. No intrahepatic or extrahepatic ductal dilatation.  Pancreas: Within normal limits.  Spleen: Severely motion degraded but grossly unremarkable.  Adrenals/Urinary Tract: Adrenal glands are motion degraded but unremarkable.  Kidneys are grossly unremarkable.  No hydronephrosis.  Bladder is within normal limits.  Stomach/Bowel: Stomach is poorly evaluated but notable for a small hiatal hernia.  No evidence of bowel obstruction.  Colonic diverticulosis.  Moderate distal colonic stool burden.  Vascular/Lymphatic: Atherosclerotic calcifications of the abdominal aorta and branch vessels.  No suspicious abdominopelvic lymphadenopathy  Reproductive:  Prostate is grossly unremarkable  Other: Moderate abdominopelvic ascites.  Small fat containing bilateral inguinal hernias.  Musculoskeletal: 5.1 x 3.8 cm right iliopsoas fluid collection/abscess (series 2/image 78), previously 5.3 x 3.5 cm, grossly unchanged.  Mild body wall edema.  Degenerative changes of the visualized thoracolumbar spine.  IMPRESSION: Motion degraded images.  5.1 x 3.8 cm right iliopsoas fluid collection/abscess, grossly unchanged.  Small bilateral pleural effusions. Moderate abdominopelvic ascites. Mild body wall edema.  No evidence of bowel obstruction. Moderate distal colonic stool burden.   Electronically Signed   By: Julian Hy M.D.   On: 05/10/2014 15:45    Assessment/Plan:  1) Psoas abscess - Strep in BCx,  no growth yet in abscess.  Continue with current antibiotics.    Scharlene Gloss, [facility] [place] [email or web address] [phone] pager   [phone] cell 05/10/2014, 5:28 PM

## 2014-05-10 NOTE — Progress Notes (Signed)
Subjective: Awake and talking asking for candy, still with confusion, + back pain  Objective: Vital signs in last 24 hours: Temp:  [98 F (36.7 C)-99.3 F (37.4 C)] 99.3 F (37.4 C) (05/05 0400) Pulse Rate:  [91-114] 114 (05/05 0600) Resp:  [21-41] 30 (05/05 0600) BP: (133-177)/(38-97) 146/97 mmHg (05/05 0600) SpO2:  [92 %-97 %] 94 % (05/05 0600) Weight:  [222 lb 0.1 oz (100.7 kg)] 222 lb 0.1 oz (100.7 kg) (05/05 0400) Weight change:  Last BM Date: 05/09/14 Intake/Output from previous day: +757 05/04 0701 - 05/05 0700 In: 2173 [I.V.:1323; IV Piggyback:850] Out: 1425 [Urine:1425] Intake/Output this shift:    PE: General:Pleasant affect, NAD Skin:Warm and dry, brisk capillary refill HEENT:normocephalic, sclera clear, mucus membranes moist Heart:irreg irreg without murmur, gallup, rub or click Lungs:clear, ant  without rales, rhonchi, or wheezes YFV:CBSW, non tender, + BS, do not palpate liver spleen or masses Ext:no lower ext edema, 2+ pedal pulses, 2+ radial pulses Neuro:alert talking, MAE, follows some commands, + facial symmetry Tele:  A fib with RVR episodically overall HR controlled   Lab Results:  Recent Labs  05/09/14 0400 05/10/14 0345  WBC 18.3* 15.1*  HGB 14.1 14.3  HCT 42.0 42.1  PLT 349 344   BMET  Recent Labs  05/09/14 0400 05/10/14 0345  NA 140 141  K 3.1* 3.4*  CL 109 111  CO2 23 21*  GLUCOSE 184* 156*  BUN 17 18  CREATININE 0.66 0.59*  CALCIUM 7.8* 8.1*   No results for input(s): TROPONINI in the last 72 hours.  Invalid input(s): CK, MB  No results found for: CHOL, HDL, LDLCALC, LDLDIRECT, TRIG, CHOLHDL No results found for: HGBA1C   Lab Results  Component Value Date   TSH 1.523 05/05/2014     Studies/Results: No results found.  Medications: I have reviewed the patient's current medications. Scheduled Meds: . antiseptic oral rinse  7 mL Mouth Rinse BID  . cefTRIAXone (ROCEPHIN)  IV  2 g Intravenous Q24H  .  febuxostat  80 mg Oral Daily  . folic acid  1 mg Oral Daily  . folic acid  1 mg Intravenous Daily  . hydrALAZINE  20 mg Intravenous Q4H  . insulin aspart  0-9 Units Subcutaneous Q4H  . metoprolol  5 mg Intravenous Once  . multivitamin with minerals  1 tablet Oral Daily  . potassium chloride  10 mEq Intravenous Q1 Hr x 3  . thiamine  100 mg Oral Daily   Or  . thiamine  100 mg Intravenous Daily  . vancomycin  1,250 mg Intravenous Q12H   Continuous Infusions: . dextrose 5 % and 0.45% NaCl 1,000 mL with potassium chloride 20 mEq infusion 50 mL/hr at 05/08/14 2229  . diltiazem (CARDIZEM) infusion 5 mg/hr (05/09/14 1300)   PRN Meds:.acetaminophen **OR** acetaminophen, haloperidol lactate, HYDROmorphone (DILAUDID) injection, LORazepam, ondansetron **OR** ondansetron (ZOFRAN) IV  Assessment/Plan: 1. New-onset rapid atrial fibrillation this admit : Most likely triggered by iliopsoas abscess and bacteremia, but chronic alcoholism also likely contributing. TSH normal. Echocardiogram Left atrium: The atrium was at the upper limits of normal, LVEF 60-65%, moderate LVH, aortic sclerosis with trivial AI, mild RAE, moderate MR and TR, RVSP 41 mmHg, dilated IVC.  Rate controlled on dilt drip at 10 during the night, this titrating to control rate.  Never below 5. .   Atrial fibrillation is nonvalvular, would recommend anticoagulation with Xarelto 20 mg daily for feasibility. Once pt stable taking po. CHADSVASC  is 4, thus thromboembolic risk is high. However, given chronic alcoholism, compliance may be an issue.- difficult to decide with confusion HOLD for now.  IV diltiazem was initiated on 4/30. Now on 10 mg /hr fairly rate controlled- likely related to agitation from combination of bacteremia and alcohol withdrawal (drinks 3 glasses of vodka daily). On haldol. Once he is adequately treated for infection and his encephalopathy improves would consider transitioning to oral long-acting diltiazem (rather  than atenolol). Not yet taking po.  2. Essential HTN: consider transitioning to oral long-acting diltiazem (rather than atenolol) Current BP ranges from 177/65 to 133/58 , now on hydralazine 20 mg IV every 4 hours with some improvement.   3. Iliopsoas absess: Strep bovis bacteremia Currently on IV fluids and antimicrobial therapy, ID is following as well. Also concern about infected Rt hip that was replaced several years ago, ortho following.    4. Encephalopathy: Agitation/confusion increased during the night: Likely related to both bacteremia and alcohol withdrawal. With increased Resp. Distress last pm, more stable currently. CCM has evaluated as well  5. DM followed by IM  6. Hypokalemia replacing.   7. hypo magnesium replaced    LOS: 6 days   Time spent with pt. :15 minutes. [facility] R  Nurse Practitioner Certified Pager [phone] or after 5pm and on weekends call [phone] 05/10/2014, 8:14 AM  History and all data above reviewed.  Patient examined.  I agree with the findings as above.  Answers questions minimally.  The patient exam reveals VEL:[id number]  ,  Lungs: Decreased breath sounds  ,  Abd: Positive bowel sounds, no rebound no guarding, Ext No edema   .  All available labs, radiology testing, previous records reviewed. Agree with documented assessment and plan. HTN:  BP seems to be somewhat better.  Still not taking POs.  Continue current meds including increased hydralazine.   Atrial fib: Continue IV Cardizem and transition to PO when able.     Breon Nozomi Mettler  12:50 PM  05/10/2014

## 2014-05-10 NOTE — Progress Notes (Signed)
  Subjective: Still confused, not tenderness right hip.  Objective: Vital signs in last 24 hours: Temp:  [98 F (36.7 C)-99.3 F (37.4 C)] 99.3 F (37.4 C) (05/05 0800) Pulse Rate:  [91-120] 120 (05/05 0836) Resp:  [21-41] 30 (05/05 0600) BP: (133-177)/(38-97) 148/86 mmHg (05/05 0836) SpO2:  [92 %-97 %] 94 % (05/05 0600) Weight:  [100.7 kg (222 lb 0.1 oz)] 100.7 kg (222 lb 0.1 oz) (05/05 0400) Last BM Date: 05/09/14 NPO Afebrile, VSSHR up 110's most of the time WBC coming down Intake/Output from previous day: 05/04 0701 - 05/05 0700 In: 2173 [I.V.:1323; IV Piggyback:850] Out: 1884 [Urine:1425] Intake/Output this shift:    General appearance: alert, cooperative, no distress and confused Right hip is not tender to palpation.    Lab Results:   Recent Labs  05/09/14 0400 05/10/14 0345  WBC 18.3* 15.1*  HGB 14.1 14.3  HCT 42.0 42.1  PLT 349 344    BMET  Recent Labs  05/09/14 0400 05/10/14 0345  NA 140 141  K 3.1* 3.4*  CL 109 111  CO2 23 21*  GLUCOSE 184* 156*  BUN 17 18  CREATININE 0.66 0.59*  CALCIUM 7.8* 8.1*   PT/INR No results for input(s): LABPROT, INR in the last 72 hours.   Recent Labs Lab 05/04/14 1500 05/05/14 0500  AST 152* 167*  ALT 92* 111*  ALKPHOS 111 120*  BILITOT 1.6* 1.8*  PROT 7.4 7.4  ALBUMIN 3.6 3.5     Lipase  No results found for: LIPASE   Studies/Results: No results found.  Medications: . antiseptic oral rinse  7 mL Mouth Rinse BID  . cefTRIAXone (ROCEPHIN)  IV  2 g Intravenous Q24H  . febuxostat  80 mg Oral Daily  . folic acid  1 mg Oral Daily  . folic acid  1 mg Intravenous Daily  . hydrALAZINE  20 mg Intravenous Q4H  . insulin aspart  0-9 Units Subcutaneous Q4H  . metoprolol  5 mg Intravenous Once  . multivitamin with minerals  1 tablet Oral Daily  . potassium chloride  10 mEq Intravenous Q1 Hr x 3  . thiamine  100 mg Oral Daily   Or  . thiamine  100 mg Intravenous Daily  . vancomycin  1,250 mg  Intravenous Q12H    Assessment/Plan Small Iliopsoas abscess Encephalopathy afib AODM  DVT: SCD's only Antibiotics;  Day 6 vancomycin, day 2 rocephin, completed 5 days of Zosyn.   Plan:  No surgery indicated, he is making some improvement, call if we can help.    LOS: 6 days    [name] 05/10/2014

## 2014-05-10 NOTE — Progress Notes (Signed)
PHARMACY - VANCOMYCIN (brief note)  Patient on Vancomycin 1250mg  IV q12h for psoas abscess  Vancomycin trough level drawn @ 1535 (resulted @ 23:07) = 15 mcg/ml   Goal - 15-20 mcg/ml  Plan:  Continue Vancomycin 1250mg  IV q12h            Continue Ceftriaxone  Leone Haven, PharmD

## 2014-05-11 ENCOUNTER — Inpatient Hospital Stay (HOSPITAL_COMMUNITY)
Admit: 2014-05-11 | Discharge: 2014-05-11 | Disposition: A | Discharge status: Home / Self Care | Payer: Medicare Other | Attending: Internal Medicine | Admitting: Internal Medicine

## 2014-05-11 ENCOUNTER — Inpatient Hospital Stay (HOSPITAL_COMMUNITY): Payer: Medicare Other

## 2014-05-11 ENCOUNTER — Encounter (HOSPITAL_COMMUNITY): Payer: Self-pay | Admitting: Radiology

## 2014-05-11 ENCOUNTER — Ambulatory Visit (HOSPITAL_COMMUNITY): Payer: Medicare Other

## 2014-05-11 DIAGNOSIS — R4182 Altered mental status, unspecified: Secondary | ICD-10-CM

## 2014-05-11 DIAGNOSIS — R0602 Shortness of breath: Secondary | ICD-10-CM

## 2014-05-11 LAB — BASIC METABOLIC PANEL
Anion gap: 7 (ref 5–15)
BUN: 15 mg/dL (ref 6–20)
CALCIUM: 8.2 mg/dL — AB (ref 8.9–10.3)
CO2: 19 mmol/L — ABNORMAL LOW (ref 22–32)
Chloride: 117 mmol/L — ABNORMAL HIGH (ref 101–111)
Creatinine, Ser: 0.62 mg/dL (ref 0.61–1.24)
GFR calc Af Amer: >60 mL/min (ref >60)
GLUCOSE: 167 mg/dL — AB (ref 70–99)
Potassium: 3.4 mmol/L — ABNORMAL LOW (ref 3.5–5.1)
Sodium: 143 mmol/L (ref 135–145)

## 2014-05-11 LAB — BLOOD GAS, ARTERIAL
Acid-base deficit: 3.5 mmol/L — ABNORMAL HIGH (ref 0.0–2.0)
BICARBONATE: 17.8 meq/L — AB (ref 20.0–24.0)
DRAWN BY: 276051
FIO2: 0.21 %
O2 SAT: 92.7 %
PO2 ART: 65.3 mmHg — AB (ref 80.0–100.0)
Patient temperature: 98.6
TCO2: 15.5 mmol/L (ref 0–100)
pCO2 arterial: 23.4 mmHg — ABNORMAL LOW (ref 35.0–45.0)
pH, Arterial: 7.494 — ABNORMAL HIGH (ref 7.350–7.450)

## 2014-05-11 LAB — GLUCOSE, CAPILLARY
GLUCOSE-CAPILLARY: 146 mg/dL — AB (ref 70–99)
GLUCOSE-CAPILLARY: 161 mg/dL — AB (ref 70–99)
Glucose-Capillary: 153 mg/dL — ABNORMAL HIGH (ref 70–99)
Glucose-Capillary: 130 mg/dL — ABNORMAL HIGH (ref 70–99)
Glucose-Capillary: 153 mg/dL — ABNORMAL HIGH (ref 70–99)

## 2014-05-11 LAB — CULTURE, ROUTINE-ABSCESS: SPECIAL REQUESTS: NORMAL

## 2014-05-11 LAB — CBC
HEMATOCRIT: 39.8 % (ref 39.0–52.0)
Hemoglobin: 13.7 g/dL (ref 13.0–17.0)
MCH: 33.4 pg (ref 26.0–34.0)
MCHC: 34.4 g/dL (ref 30.0–36.0)
MCV: 97.1 fL (ref 78.0–100.0)
Platelets: 357 10*3/uL (ref 150–400)
RBC: 4.1 MIL/uL — ABNORMAL LOW (ref 4.22–5.81)
RDW: 14.5 % (ref 11.5–15.5)
WBC: 16.7 10*3/uL — ABNORMAL HIGH (ref 4.0–10.5)

## 2014-05-11 LAB — D-DIMER, QUANTITATIVE: D-Dimer, Quant: 6.32 ug/mL-FEU — ABNORMAL HIGH (ref 0.00–0.48)

## 2014-05-11 LAB — MAGNESIUM: MAGNESIUM: 1.7 mg/dL (ref 1.7–2.4)

## 2014-05-11 LAB — VITAMIN B12: Vitamin B-12: 894 pg/mL (ref 180–914)

## 2014-05-11 LAB — LACTIC ACID, PLASMA: LACTIC ACID, VENOUS: 1.4 mmol/L (ref 0.5–2.0)

## 2014-05-11 MED ORDER — MORPHINE SULFATE 4 MG/ML IJ SOLN
4.0000 mg | Freq: Once | INTRAMUSCULAR | Status: AC
Start: 2014-05-11 — End: 2014-05-11
  Administered 2014-05-11: 4 mg via INTRAVENOUS
  Filled 2014-05-11: qty 1

## 2014-05-11 MED ORDER — ALBUTEROL SULFATE (2.5 MG/3ML) 0.083% IN NEBU
2.5000 mg | INHALATION_SOLUTION | RESPIRATORY_TRACT | Status: DC | PRN
  Administered 2014-05-11 – 2014-05-13 (×5): 2.5 mg via RESPIRATORY_TRACT
  Filled 2014-05-11 (×5): qty 3

## 2014-05-11 MED ORDER — MAGNESIUM SULFATE 4 GM/100ML IV SOLN
4.0000 g | Freq: Once | INTRAVENOUS | Status: AC
  Administered 2014-05-11: 4 g via INTRAVENOUS
  Filled 2014-05-11: qty 100

## 2014-05-11 MED ORDER — POTASSIUM CHLORIDE 10 MEQ/100ML IV SOLN
10.0000 meq | INTRAVENOUS | Status: AC
  Administered 2014-05-11 (×3): 10 meq via INTRAVENOUS
  Filled 2014-05-11 (×3): qty 100

## 2014-05-11 MED ORDER — ACETYLCYSTEINE 20 % IN SOLN
4.0000 mL | Freq: Three times a day (TID) | RESPIRATORY_TRACT | Status: DC
  Administered 2014-05-11 – 2014-05-13 (×5): 4 mL via RESPIRATORY_TRACT
  Filled 2014-05-11 (×5): qty 4

## 2014-05-11 MED ORDER — IOHEXOL 350 MG/ML SOLN
100.0000 mL | Freq: Once | INTRAVENOUS | Status: AC | PRN
  Administered 2014-05-11: 100 mL via INTRAVENOUS

## 2014-05-11 MED ORDER — HYDRALAZINE HCL 20 MG/ML IJ SOLN
10.0000 mg | INTRAMUSCULAR | Status: DC
  Administered 2014-05-11 – 2014-05-23 (×59): 10 mg via INTRAVENOUS
  Filled 2014-05-11 (×59): qty 1

## 2014-05-11 MED ORDER — VITAL HIGH PROTEIN PO LIQD: 1000.0000 mL | ORAL | Status: DC

## 2014-05-11 MED ORDER — ENOXAPARIN SODIUM 40 MG/0.4ML ~~LOC~~ SOLN
40.0000 mg | Freq: Every day | SUBCUTANEOUS | Status: DC
  Administered 2014-05-11: 40 mg via SUBCUTANEOUS
  Filled 2014-05-11: qty 0.4

## 2014-05-11 MED ORDER — GLUCERNA 1.2 CAL PO LIQD
1000.0000 mL | ORAL | Status: DC
  Administered 2014-05-11: 1000 mL
  Filled 2014-05-11 (×23): qty 1000

## 2014-05-11 MED ORDER — METOPROLOL TARTRATE 1 MG/ML IV SOLN
10.0000 mg | Freq: Four times a day (QID) | INTRAVENOUS | Status: DC
  Administered 2014-05-11 (×2): 10 mg via INTRAVENOUS
  Filled 2014-05-11 (×3): qty 10

## 2014-05-11 MED ORDER — PRO-STAT SUGAR FREE PO LIQD
30.0000 mL | Freq: Two times a day (BID) | ORAL | Status: DC
  Administered 2014-05-11 – 2014-05-24 (×16): 30 mL
  Filled 2014-05-11 (×38): qty 30

## 2014-05-11 NOTE — Progress Notes (Signed)
Notified K Shorr NP of CPOT pain scale, and tachypnea new orders noted and received.

## 2014-05-11 NOTE — Progress Notes (Signed)
Offsite EEG completed at [facility]. Results pending. 

## 2014-05-11 NOTE — Progress Notes (Signed)
Patient to MRI at 2035 for MRI of brain, tolerated well, Patient received ativan 2 mg for procedure tolerated well.

## 2014-05-11 NOTE — Progress Notes (Signed)
[facility] PROGRESS NOTE  [name] CWC:[id number] DOB: 26-Mar-1935 DOA: 05/04/2014 PCP: Lujean Amel, MD  Assessment/Plan: #1 sepsis Patient on presentation had a temperature 101.5  increased heart rate and new onset A. Fib in the presence of infection. Blood cultures with 1 out of 2 growing streptococcal bovis. Right iliopsoas has been aspirated and cultures are consistent with Streptococcus very dense. Repeat CT of the abdomen and pelvis with no significant change in iliopsoas abscess. Will need to discuss with surgery. Patient still with fevers. Continue empiric IV vancomycin and IV Rocephin. Patient has been seen by ID.  #2 acute encephalopathy Questionable etiology. May be metabolic in nature secondary to acute infection with psoas abscess and probable streptococcal bovis bacteremia and probable alcohol withdrawal versus neurological as patient with A. fib versus starvation acidosis. Patient with decreased mentation with moaning to noxious stimuli. Patient refuses to open his eyes. Will check blood acetone level. Check ABG. Check a lactic acid. Check a CT head. CT head is negative will check a MRI of the head. Check a EEG. Check RPR, RBC folate, B-12 levels. Continue Ativan 1 mg every 4 hours as needed. Appreciate critical care consultation. Continue empiric IV antibiotics. Follow. Will consult with neurology for further evaluation and management.  #3 streptococcal bovis bacteremia Likely seeded from the GI tract. Patient has been seen in consultation by infectious disease and recommended narrowing antibiotics. Concern also for colorectal malignancy. CT of the abdomen and pelvis pending. Patient will likely need a colonoscopy as outpatient. Patient currently on IV vancomycin and IV Rocephin. ID following and appreciate input and recommendations.  #4 iliopsoas abscess with moderate viridans streptococcus Status post aspiration. Cultures with moderate viridans streptococcus. Continue  empiric IV vancomycin and IV Rocephin. ID following. Follow.  #5 fever Likely secondary to problems #2 and 3. Concern for possible hip infection. CT abdomen and pelvis with no significant change and so was abscess. Continue empiric IV antibiotics for now. ID following.  #6 New Onset atrial fibrillation Likely secondary to iliopsoas abscess and bacteremia.  TSH within normal limits. HR 90s this morning. 2-D echo with EF of 60-65% with moderate LVH, aortic sclerosis with trivial AI, mild RAE, moderate MR and TR, dilated IVC. Patient currently on a Cardizem drip. Once more alert may transition to oral long-acting Cardizem for rate control. Patient with a CHADSVASC score 4. Patient likely needs anticoagulation however due to history of chronic alcoholism question of compliance. Cardiology following and appreciate input and recommendations.  #7 hypertension Currently on Cardizem. When patient is more alert will transition to long-acting Cardizem.  #8 hypokalemia/hypomagnesemia Replete.  #9 dm 2 Currently NPO secondary to altered MS. CBGs have ranged from 139-154.sliding scale insulin.  #10 history of alcohol abuse Patient now minimally responsive. Moans to noxious stimuli. Ativan as needed for agitation. Appreciate critical care input.  #11 nutrition Place of Panda tube. Consult with dietitian to start tube feeds.  #12 prophylaxis SCDs for DVT prophylaxis.  Code Status: full Family Communication: Updated daughter via phone [facility] [phone]) Disposition Plan: remain in the step down unit.   Consultants:  Gen. Surgery: Dr. Lucia Gaskins 05/04/2014  Cardiology: Dr. Jacinta Shoe 05/05/2014  Orthopedics: Dr. Berenice Primas 05/08/2014  PCCM: Dr. Elsworth Soho 05/09/2014  Infectious disease: Dr. Linus Salmons 05/09/2014  Procedures:   CT pelvis 05/04/2014   plain films of the right hip and pelvis 05/04/2014   2-D echo 05/06/2014  Chest x-ray 05/08/2014, 05/06/2014, 05/04/2014  X-ray of the right knee  05/04/2014  CT aspiration 5 mL  of bloody purulent material 05/07/2014  CT abdomen and pelvis 05/10/2014  Chest x-ray 05/11/2014  Antibiotics:   IV vancomycin   05/04/2014  IV Zosyn  05/04/2014>>>> 05/09/2014   IV Rocephin 05/09/2014  HPI/Subjective:  patient minimally responsive to verbal stimuli, and moans to noxious stimuli.  Objective: Filed Vitals:   05/11/14 0800  BP: 147/76  Pulse: 111  Temp: 98.9 F (37.2 C)  Resp: 42    Intake/Output Summary (Last 24 hours) at 05/11/14 0907 Last data filed at 05/11/14 0837  Gross per 24 hour  Intake 2487.5 ml  Output    800 ml  Net 1687.5 ml   Filed Weights   05/04/14 1617 05/04/14 2319 05/10/14 0400  Weight: 102.059 kg (225 lb) 101.3 kg (223 lb 5.2 oz) 100.7 kg (222 lb 0.1 oz)    Exam:   General:   Patient moans to noxious stimuli. Dry mucous membranes.  Cardiovascular:  Irregularly irregular.  Respiratory:  Some  coarse sounds in anterior lung fields.Inreased RR. Thoracoabdominal breathing.  Abdomen:  Soft, nontender, nondistended, positive bowel sounds.  Musculoskeletal:  No clubbing cyanosis or edema.  Data Reviewed: Basic Metabolic Panel:  Recent Labs Lab 05/08/14 0340 05/08/14 1805 05/09/14 0400 05/09/14 0915 05/10/14 0345 05/11/14 0340  NA 137 141 140  --  141 143  K 2.6* 2.4* 3.1*  --  3.4* 3.4*  CL 110 108 109  --  111 117*  CO2 22 24 23   --  21* 19*  GLUCOSE 171* 153* 184*  --  156* 167*  BUN 14 13 17   --  18 15  CREATININE 0.73 0.73 0.66  --  0.59* 0.62  CALCIUM 7.7* 8.1* 7.8*  --  8.1* 8.2*  MG  --   --   --  1.3* 1.9 1.7  PHOS  --   --   --  3.3  --   --    Liver Function Tests:  Recent Labs Lab 05/04/14 1500 05/05/14 0500  AST 152* 167*  ALT 92* 111*  ALKPHOS 111 120*  BILITOT 1.6* 1.8*  PROT 7.4 7.4  ALBUMIN 3.6 3.5   No results for input(s): LIPASE, AMYLASE in the last 168 hours.  Recent Labs Lab 05/09/14 0915  AMMONIA 29   CBC:  Recent Labs Lab 05/04/14 1500  05/05/14 0500 05/08/14 0340 05/09/14 0400 05/10/14 0345 05/11/14 0340  WBC 10.3 9.8 14.4* 18.3* 15.1* 16.7*  NEUTROABS 8.0* 7.2  --   --   --   --   HGB 13.0 13.6 13.0 14.1 14.3 13.7  HCT 37.7* 39.0 38.6* 42.0 42.1 39.8  MCV 95.2 96.3 95.1 97.4 96.8 97.1  PLT 180 206 239 349 344 357   Cardiac Enzymes: No results for input(s): CKTOTAL, CKMB, CKMBINDEX, TROPONINI in the last 168 hours. BNP (last 3 results) No results for input(s): BNP in the last 8760 hours.  ProBNP (last 3 results) No results for input(s): PROBNP in the last 8760 hours.  CBG:  Recent Labs Lab 05/10/14 1215 05/10/14 1656 05/10/14 2017 05/10/14 2331 05/11/14 0752  GLUCAP 148* 165* 139* 154* 146*    Recent Results (from the past 240 hour(s))  Blood Culture (routine x 2)     Status: None   Collection Time: 05/04/14  3:00 PM  Result Value Ref Range Status   Specimen Description BLOOD LEFT WRIST  5 ML IN [facility] BOTTLE  Final   Special Requests NONE  Final   Culture   Final    NO GROWTH 5  DAYS Performed at [facility]    Report Status 05/10/2014 FINAL  Final  Blood Culture (routine x 2)     Status: None   Collection Time: 05/04/14  4:21 PM  Result Value Ref Range Status   Specimen Description BLOOD RIGHT ANTECUBITAL  Final   Special Requests BOTTLES DRAWN AEROBIC AND ANAEROBIC 5CC  Final   Culture   Final    STREPTOCOCCUS GROUP D;high probability for S.bovis Note: Gram Stain Report Called to,Read Back By and Verified With: CAROLYN OAKLEY 4.30.16 BY MANGR 503PM Performed at [facility]    Report Status 05/08/2014 FINAL  Final   Organism ID, Bacteria STREPTOCOCCUS GROUP D;high probability for S.bovis  Final      Susceptibility   Streptococcus group d;high probability for s.bovis - MIC (ETEST)*    PENICILLIN .064 SENSITIVE Sensitive     * STREPTOCOCCUS GROUP D;high probability for S.bovis  Urine culture     Status: None   Collection Time: 05/04/14  5:51 PM  Result Value Ref Range  Status   Specimen Description URINE, CLEAN CATCH  Final   Special Requests NONE  Final   Colony Count   Final    7,000 COLONIES/ML Performed at [facility]    Culture   Final    INSIGNIFICANT GROWTH Performed at [facility]    Report Status 05/05/2014 FINAL  Final  MRSA PCR Screening     Status: None   Collection Time: 05/04/14 11:24 PM  Result Value Ref Range Status   MRSA by PCR NEGATIVE NEGATIVE Final    Comment:        The GeneXpert MRSA Assay (FDA approved for NASAL specimens only), is one component of a comprehensive MRSA colonization surveillance program. It is not intended to diagnose MRSA infection nor to guide or monitor treatment for MRSA infections.   Culture, routine-abscess     Status: None   Collection Time: 05/07/14  2:29 PM  Result Value Ref Range Status   Specimen Description ABSCESS  Final   Special Requests Normal  Final   Gram Stain   Final    ABUNDANT WBC PRESENT,BOTH PMN AND MONONUCLEAR NO SQUAMOUS EPITHELIAL CELLS SEEN NO ORGANISMS SEEN Performed at [facility]    Culture   Final    MODERATE VIRIDANS STREPTOCOCCUS Performed at [facility]    Report Status 05/11/2014 FINAL  Final  Culture, blood (routine x 2)     Status: None (Preliminary result)   Collection Time: 05/08/14  7:00 PM  Result Value Ref Range Status   Specimen Description BLOOD RIGHT HAND  Final   Special Requests BOTTLES DRAWN AEROBIC AND ANAEROBIC 10CC  Final   Culture   Final           BLOOD CULTURE RECEIVED NO GROWTH TO DATE CULTURE WILL BE HELD FOR 5 DAYS BEFORE ISSUING A FINAL NEGATIVE REPORT Performed at [facility]    Report Status PENDING  Incomplete  Culture, blood (routine x 2)     Status: None (Preliminary result)   Collection Time: 05/08/14  7:15 PM  Result Value Ref Range Status   Specimen Description BLOOD RIGHT ARM  Final   Special Requests BOTTLES DRAWN AEROBIC AND ANAEROBIC 10CC  Final   Culture   Final            BLOOD CULTURE RECEIVED NO GROWTH TO DATE CULTURE WILL BE HELD FOR 5 DAYS BEFORE ISSUING A FINAL NEGATIVE REPORT Performed at [facility]    Report Status PENDING  Incomplete     Studies: Ct Abdomen Pelvis W Contrast  05/10/2014   CLINICAL DATA:  Fever, follow-up iliopsoas abscess. History of right hip replacement.  EXAM: CT ABDOMEN AND PELVIS WITH CONTRAST  TECHNIQUE: Multidetector CT imaging of the abdomen and pelvis was performed using the standard protocol following bolus administration of intravenous contrast.  CONTRAST:  150mL OMNIPAQUE IOHEXOL 300 MG/ML  SOLN  COMPARISON:  CT pelvis dated 05/04/2014  FINDINGS: Motion degraded images.  Lower chest:  Small bilateral pleural effusions.  Cardiomegaly.  Coronary atherosclerosis.  Hepatobiliary: Liver is grossly unremarkable.  Gallbladder is severely motion degraded. No intrahepatic or extrahepatic ductal dilatation.  Pancreas: Within normal limits.  Spleen: Severely motion degraded but grossly unremarkable.  Adrenals/Urinary Tract: Adrenal glands are motion degraded but unremarkable.  Kidneys are grossly unremarkable.  No hydronephrosis.  Bladder is within normal limits.  Stomach/Bowel: Stomach is poorly evaluated but notable for a small hiatal hernia.  No evidence of bowel obstruction.  Colonic diverticulosis.  Moderate distal colonic stool burden.  Vascular/Lymphatic: Atherosclerotic calcifications of the abdominal aorta and branch vessels.  No suspicious abdominopelvic lymphadenopathy  Reproductive: Prostate is grossly unremarkable  Other: Moderate abdominopelvic ascites.  Small fat containing bilateral inguinal hernias.  Musculoskeletal: 5.1 x 3.8 cm right iliopsoas fluid collection/abscess (series 2/image 78), previously 5.3 x 3.5 cm, grossly unchanged.  Mild body wall edema.  Degenerative changes of the visualized thoracolumbar spine.  IMPRESSION: Motion degraded images.  5.1 x 3.8 cm right iliopsoas fluid collection/abscess, grossly  unchanged.  Small bilateral pleural effusions. Moderate abdominopelvic ascites. Mild body wall edema.  No evidence of bowel obstruction. Moderate distal colonic stool burden.   Electronically Signed   By: Julian Hy M.D.   On: 05/10/2014 15:45    Scheduled Meds: . antiseptic oral rinse  7 mL Mouth Rinse BID  . cefTRIAXone (ROCEPHIN)  IV  2 g Intravenous Q24H  . febuxostat  80 mg Oral Daily  . folic acid  1 mg Oral Daily  . folic acid  1 mg Intravenous Daily  . hydrALAZINE  20 mg Intravenous Q4H  . insulin aspart  0-9 Units Subcutaneous Q4H  . magnesium sulfate 1 - 4 g bolus IVPB  4 g Intravenous Once  . metoprolol  5 mg Intravenous Once  . multivitamin with minerals  1 tablet Oral Daily  . potassium chloride  10 mEq Intravenous Q1 Hr x 3  . thiamine  100 mg Oral Daily   Or  . thiamine  100 mg Intravenous Daily  . vancomycin  1,250 mg Intravenous Q12H   Continuous Infusions: . dextrose 5 % and 0.45% NaCl 1,000 mL with potassium chloride 40 mEq infusion 75 mL/hr at 05/11/14 0830  . diltiazem (CARDIZEM) infusion 15 mg/hr (05/11/14 0850)    Principal Problem:   Sepsis Active Problems:   Streptococcal bacteremia   Acute encephalopathy   Fever   Psoas abscess   New onset atrial fibrillation   Diabetes mellitus type 2, controlled   Essential hypertension   Gout   Psoas abscess, right   Iliopsoas abscess on right   Atrial fibrillation with rapid ventricular response   Alcoholism   Type 2 diabetes mellitus without complication   Pyrexia   Abscess of male pelvis   Right hip pain    Time spent: 32 mins    [facility]  MD [facility] Pager [phone]. If 7PM-7AM, please contact night-coverage at [email or web address], password [facility] 05/11/2014, 9:07 AM  LOS:  7 days            5

## 2014-05-11 NOTE — Progress Notes (Signed)
Rn informed MD of concern over patient's airway. Patient unable to clear airway secretions and RN is continuously performing NTS and clearing big mucous plugs. Patient has now coughed out NG tube. Patient desaturating when needing suctioning and fighting staff when suctioning is preformed.

## 2014-05-11 NOTE — Procedures (Signed)
ELECTROENCEPHALOGRAM REPORT  Date of Study: 05/11/2014  Patient's Name: [name] MRN: [id number] Date of Birth: [birth year]  Referring Provider: Dr. Cline Cools  Clinical History: This is a 79 year old man with unresponsiveness.  Medications: acetaminophen (TYLENOL) tablet 650 mg cefTRIAXone (ROCEPHIN) 2 g in dextrose 5 % 50 mL IVPB - Premix diltiazem (CARDIZEM) 100 mg in dextrose 5 % 100 mL (1 mg/mL) infusion febuxostat (ULORIC) tablet 80 mg folic acid (FOLVITE) tablet 1 mg haloperidol lactate (HALDOL) injection 2 mg thiamine (VITAMIN B-1) tablet 100 mg vancomycin (VANCOCIN) 1,250 mg in sodium chloride 0.9 % 250 mL IVPB  Technical Summary: A multichannel digital EEG recording measured by the international 10-20 system with electrodes applied with paste and impedances below 5000 ohms performed as portable with EKG monitoring in an unresponsive patient.  Hyperventilation and photic stimulation were not performed.  The digital EEG was referentially recorded, reformatted, and digitally filtered in a variety of bipolar and referential montages for optimal display.   Description: The patient is unresponsive during the recording. He is noted to be tachypneic. There is a poorly sustained symmetric, medium voltage 7-7.5 Hz posterior dominant rhythm that poorly attenuates to eye opening and eye closure. This is admixed with a moderate amount of diffuse 4-7 Hz theta and 2-3 Hz delta slowing of the background.  Normal sleep architecture is not seen. Hyperventilation and photic stimulation were not performed. There were no epileptiform discharges or electrographic seizures seen.    EKG lead showed irregular rhythm.  Impression: This EEG is abnormal due to moderate diffuse slowing of the background.  Clinical Correlation of the above findings indicates diffuse cerebral dysfunction that is non-specific in etiology and can be seen with hypoxic/ischemic injury, toxic/metabolic encephalopathies,  neurodegenerative disorders, or medication effect.  The absence of epileptiform discharges does not rule out a clinical diagnosis of epilepsy.  Clinical correlation is advised.   Ellouise Newer, M.D.

## 2014-05-11 NOTE — Progress Notes (Signed)
Patient Profile: The patient is a 79 yr old male with a PMH significant for HTN, type 2 diabetes, prior h/o tobacco abuse, chronic alcoholism (drinks 3 glasses of vodka daily), and gout who was admitted on 4/29 with right hip and right lower quadrant pain and was found to have an iliopsoas abscess. S/p aspiration. Blood culture with Strep bovis in 1/2. Culture in abscess pending. ID following and now on IV antibiotics. Hospital course complicated by sepsis, acute encephalopathy and new onset atrial fibrillation w/ RVR. CHA2DS2 VASc score of 4.    Subjective: Acute encephalopathy/AMS. Noncommunicative.   Objective: Vital signs in last 24 hours: Temp:  [98.1 F (36.7 C)-99.8 F (37.7 C)] 99.7 F (37.6 C) (05/06 0437) Pulse Rate:  [90-223] 90 (05/06 0600) Resp:  [25-44] 38 (05/06 0600) BP: (129-178)/(54-117) 155/57 mmHg (05/06 0600) SpO2:  [94 %-96 %] 95 % (05/06 0600) Last BM Date: 05/10/14  Intake/Output from previous day: 05/05 0701 - 05/06 0700 In: 2853.8 [I.V.:2003.8; IV Piggyback:850] Out: 600 [Urine:600] Intake/Output this shift:    Medications . antiseptic oral rinse  7 mL Mouth Rinse BID  . cefTRIAXone (ROCEPHIN)  IV  2 g Intravenous Q24H  . enoxaparin (LOVENOX) injection  40 mg Subcutaneous Daily  . febuxostat  80 mg Oral Daily  . folic acid  1 mg Oral Daily  . folic acid  1 mg Intravenous Daily  . hydrALAZINE  20 mg Intravenous Q4H  . insulin aspart  0-9 Units Subcutaneous Q4H  . metoprolol  5 mg Intravenous Once  . multivitamin with minerals  1 tablet Oral Daily  . thiamine  100 mg Oral Daily   Or  . thiamine  100 mg Intravenous Daily  . vancomycin  1,250 mg Intravenous Q12H   . dextrose 5 % and 0.45% NaCl 1,000 mL with potassium chloride 40 mEq infusion 75 mL/hr at 05/11/14 0830  . diltiazem (CARDIZEM) infusion 15 mg/hr (05/11/14 0850)   PE: General appearance: delirious and tachypneic Neck: no carotid bruit and no JVD Lungs: clear to auscultation  bilaterally Heart: irregularly irregular rhythm Extremities: no LEE Pulses: 2+ and symmetric Skin: warm and dry Neurologic: Grossly normal  Lab Results:   Recent Labs  05/09/14 0400 05/10/14 0345 05/11/14 0340  WBC 18.3* 15.1* 16.7*  HGB 14.1 14.3 13.7  HCT 42.0 42.1 39.8  PLT 349 344 357   BMET  Recent Labs  05/09/14 0400 05/10/14 0345 05/11/14 0340  NA 140 141 143  K 3.1* 3.4* 3.4*  CL 109 111 117*  CO2 23 21* 19*  GLUCOSE 184* 156* 167*  BUN 17 18 15   CREATININE 0.66 0.59* 0.62  CALCIUM 7.8* 8.1* 8.2*     Assessment/Plan  Principal Problem:   Sepsis Active Problems:   Fever   Psoas abscess   New onset atrial fibrillation   Diabetes mellitus type 2, controlled   Essential hypertension   Gout   Psoas abscess, right   Iliopsoas abscess on right   Atrial fibrillation with rapid ventricular response   Alcoholism   Type 2 diabetes mellitus without complication   Pyrexia   Abscess of male pelvis   Right hip pain   Streptococcal bacteremia   Acute encephalopathy   1. Atrial Fibrillation w/ RVR: Resting rate now in the 90' to low 100s. BP stable. Continue IV Cardizem. Would avoid IV hydralazine as this may cause reflex tachycardiac and exacerbate his atrial fibrillation. Currently ordered as scheduled at 20 mg Q4H.  May be better to use IV  metoprolol if additional BP coverage is needed. CHA2DS2 VASc score is 4. Will address oral anticoagulation if patient recovers from acute medical illness.   2. Hypokalemia: 3.4 today.  Supplemental K has been ordered by primary team.   3. Medical Issues including abscess, sepsis, streptococcal bovis bacteremia and acute encephalopathy all managed by IM and ID.    LOS: 7 days    Brittainy M. Ladoris Gene 05/11/2014 7:53 AM  The patient was seen, examined and discussed with Brittainy M. Rosita Fire, PA-C and I agree with the above.   The patient still septic, now with acute encephalopathy, undergoing EEG today. He is  in a-fib with better HR control, now in 88' to low 100'. I will add metoprolol 10 mg iv Q6H and decrease hydralzine to 10 mg iv Q6 H as it may cause reflex tachycardia.   Dorothy Spark 05/11/2014

## 2014-05-11 NOTE — Progress Notes (Addendum)
Nutrition Follow-up  DOCUMENTATION CODES:  Obesity unspecified  INTERVENTION:  Initiate Glucerna 1.2 @ 20 ml/hr via Panda tube and increase by 10 ml every 8 hours to goal rate of 70 ml/hr.   30 ml Prostat BID.    Tube feeding regimen provides 2216 kcal (100% of needs), 123 grams of protein, and 1361 ml of H2O.   Monitor magnesium, potassium, and phosphorus daily for at least 3 days, MD to replete as needed, as pt is at risk for refeeding syndrome given pt with no po x 8 days and pt with history of alcohol abuse.  RD will continue to monitor  NUTRITION DIAGNOSIS:  Inadequate oral intake related to inability to eat as evidenced by NPO status.  ongoing  GOAL:  Patient will meet greater than or equal to 90% of their needs  Not met  MONITOR:  PO intake, Diet advancement, Labs  REASON FOR ASSESSMENT:  Consult Enteral/tube feeding initiation and management  ASSESSMENT: 79 y.o. male with history of diabetes mellitus type 2, chronic alcoholism, hypertension, gout presents to the ER because of worsening pain in the right hip and right lower quadrant.  5/3: - Pt s/p CT guided aspiration of left psoas hematoma 5/2 - Ongoing encephalopathy, pt currently NPO. Nutritional history obtained from RN and pt medical chart.  - No weight history.   5/6: - Consult received for TF initiation and management. Panda tube placed 5/6. - RD to order TF. Pt with history of DM.  - Will monitor for TF tolerance. - Pt is at risk for re-feeding syndrome given that he has been NPO for 8 days and has a history of ETOH abuse.    Labs and medications reviewed  K 3.4  Cl 117  Ca 8.2  Height:  Ht Readings from Last 1 Encounters:  05/04/14 5' 7"  (1.702 m)    Weight:  Wt Readings from Last 1 Encounters:  05/10/14 222 lb 0.1 oz (100.7 kg)    Ideal Body Weight:  61.4 kg  Wt Readings from Last 10 Encounters:  05/10/14 222 lb 0.1 oz (100.7 kg)    BMI:  Body mass index is 34.76  kg/(m^2).  Estimated Nutritional Needs:  Kcal:  2100-2300  Protein:  120-130 g  Fluid:  2.1 L/day  Skin:  Reviewed, no issues  Diet Order:  Diet NPO time specified  EDUCATION NEEDS:  Education needs no appropriate at this time   Intake/Output Summary (Last 24 hours) at 05/11/14 1417 Last data filed at 05/11/14 1400  Gross per 24 hour  Intake 3442.5 ml  Output    800 ml  Net 2642.5 ml    Last BM:  5/4  Laurette Schimke [place], [place], [place]

## 2014-05-11 NOTE — Progress Notes (Signed)
[place] [facility]  Date of Admission:  05/04/2014  Antibiotics: Vancomycin and ceftriaxone  Subjective: No acute events  Objective: Temp:  [98 F (36.7 C)-99.8 F (37.7 C)] 98 F (36.7 C) (05/06 1324) Pulse Rate:  [86-112] 102 (05/06 1400) Resp:  [22-44] 35 (05/06 1400) BP: (136-178)/(54-98) 142/66 mmHg (05/06 1400) SpO2:  [91 %-97 %] 96 % (05/06 1400)  General: awake, confused Skin: diffuse blanching confluent erythema Lungs: CTA B Cor: irr irr   Lab Results Lab Results  Component Value Date   WBC 16.7* 05/11/2014   HGB 13.7 05/11/2014   HCT 39.8 05/11/2014   MCV 97.1 05/11/2014   PLT 357 05/11/2014    Lab Results  Component Value Date   CREATININE 0.62 05/11/2014   BUN 15 05/11/2014   NA 143 05/11/2014   K 3.4* 05/11/2014   CL 117* 05/11/2014   CO2 19* 05/11/2014    Lab Results  Component Value Date   ALT 111* 05/05/2014   AST 167* 05/05/2014   ALKPHOS 120* 05/05/2014   BILITOT 1.8* 05/05/2014      Microbiology: Recent Results (from the past 240 hour(s))  Blood Culture (routine x 2)     Status: None   Collection Time: 05/04/14  3:00 PM  Result Value Ref Range Status   Specimen Description BLOOD LEFT WRIST  5 ML IN [facility] BOTTLE  Final   Special Requests NONE  Final   Culture   Final    NO GROWTH 5 DAYS Performed at [facility]    Report Status 05/10/2014 FINAL  Final  Blood Culture (routine x 2)     Status: None   Collection Time: 05/04/14  4:21 PM  Result Value Ref Range Status   Specimen Description BLOOD RIGHT ANTECUBITAL  Final   Special Requests BOTTLES DRAWN AEROBIC AND ANAEROBIC 5CC  Final   Culture   Final    STREPTOCOCCUS GROUP D;high probability for S.bovis Note: Gram Stain Report Called to,Read Back By and Verified With: CAROLYN OAKLEY 4.30.16 BY MANGR 503PM Performed at [facility]    Report Status 05/08/2014 FINAL  Final   Organism ID, Bacteria STREPTOCOCCUS GROUP D;high probability for  S.bovis  Final      Susceptibility   Streptococcus group d;high probability for s.bovis - MIC (ETEST)*    PENICILLIN .064 SENSITIVE Sensitive     * STREPTOCOCCUS GROUP D;high probability for S.bovis  Urine culture     Status: None   Collection Time: 05/04/14  5:51 PM  Result Value Ref Range Status   Specimen Description URINE, CLEAN CATCH  Final   Special Requests NONE  Final   Colony Count   Final    7,000 COLONIES/ML Performed at [facility]    Culture   Final    INSIGNIFICANT GROWTH Performed at [facility]    Report Status 05/05/2014 FINAL  Final  MRSA PCR Screening     Status: None   Collection Time: 05/04/14 11:24 PM  Result Value Ref Range Status   MRSA by PCR NEGATIVE NEGATIVE Final    Comment:        The GeneXpert MRSA Assay (FDA approved for NASAL specimens only), is one component of a comprehensive MRSA colonization surveillance program. It is not intended to diagnose MRSA infection nor to guide or monitor treatment for MRSA infections.   Culture, routine-abscess     Status: None   Collection Time: 05/07/14  2:29 PM  Result Value Ref Range  Status   Specimen Description ABSCESS  Final   Special Requests Normal  Final   Gram Stain   Final    ABUNDANT WBC PRESENT,BOTH PMN AND MONONUCLEAR NO SQUAMOUS EPITHELIAL CELLS SEEN NO ORGANISMS SEEN Performed at [facility]    Culture   Final    MODERATE VIRIDANS STREPTOCOCCUS Performed at [facility]    Report Status 05/11/2014 FINAL  Final  Culture, blood (routine x 2)     Status: None (Preliminary result)   Collection Time: 05/08/14  7:00 PM  Result Value Ref Range Status   Specimen Description BLOOD RIGHT HAND  Final   Special Requests BOTTLES DRAWN AEROBIC AND ANAEROBIC 10CC  Final   Culture   Final           BLOOD CULTURE RECEIVED NO GROWTH TO DATE CULTURE WILL BE HELD FOR 5 DAYS BEFORE ISSUING A FINAL NEGATIVE REPORT Performed at [facility]    Report Status  PENDING  Incomplete  Culture, blood (routine x 2)     Status: None (Preliminary result)   Collection Time: 05/08/14  7:15 PM  Result Value Ref Range Status   Specimen Description BLOOD RIGHT ARM  Final   Special Requests BOTTLES DRAWN AEROBIC AND ANAEROBIC 10CC  Final   Culture   Final           BLOOD CULTURE RECEIVED NO GROWTH TO DATE CULTURE WILL BE HELD FOR 5 DAYS BEFORE ISSUING A FINAL NEGATIVE REPORT Performed at [facility]    Report Status PENDING  Incomplete    Studies/Results: Dg Abd 1 View  05/11/2014   CLINICAL DATA:  79 year old male with a history of nasogastric tube placement  EXAM: ABDOMEN - 1 VIEW  COMPARISON:  None.  FINDINGS: Gas within small bowel and colon. No abnormally distended small bowel loops.  Tip with surgical tubing projects within the mid mediastinum, below the level of the carina, potentially in the distal esophagus. No gastric tube within the left upper quadrant.  Calcifications of the aorta.  Formed stool within the rectum.  Surgical changes of right hip arthroplasty  IMPRESSION: Tip of gastric tube projects within the distal esophagus, below the level of the carina though not in the stomach.  Atherosclerosis.  Unremarkable bowel gas pattern with formed stool in the rectum, better characterized on recent prior CT.  Signed,  Dulcy Fanny. Earleen Newport, DO  [facility]  [facility]   Electronically Signed   By: Corrie Mckusick D.O.   On: 05/11/2014 12:32   Ct Abdomen Pelvis W Contrast  05/10/2014   CLINICAL DATA:  Fever, follow-up iliopsoas abscess. History of right hip replacement.  EXAM: CT ABDOMEN AND PELVIS WITH CONTRAST  TECHNIQUE: Multidetector CT imaging of the abdomen and pelvis was performed using the standard protocol following bolus administration of intravenous contrast.  CONTRAST:  162mL OMNIPAQUE IOHEXOL 300 MG/ML  SOLN  COMPARISON:  CT pelvis dated 05/04/2014  FINDINGS: Motion degraded images.  Lower chest:   Small bilateral pleural effusions.  Cardiomegaly.  Coronary atherosclerosis.  Hepatobiliary: Liver is grossly unremarkable.  Gallbladder is severely motion degraded. No intrahepatic or extrahepatic ductal dilatation.  Pancreas: Within normal limits.  Spleen: Severely motion degraded but grossly unremarkable.  Adrenals/Urinary Tract: Adrenal glands are motion degraded but unremarkable.  Kidneys are grossly unremarkable.  No hydronephrosis.  Bladder is within normal limits.  Stomach/Bowel: Stomach is poorly evaluated but notable for a small hiatal hernia.  No evidence of bowel obstruction.  Colonic diverticulosis.  Moderate distal colonic stool burden.  Vascular/Lymphatic: Atherosclerotic calcifications of the abdominal aorta and branch vessels.  No suspicious abdominopelvic lymphadenopathy  Reproductive: Prostate is grossly unremarkable  Other: Moderate abdominopelvic ascites.  Small fat containing bilateral inguinal hernias.  Musculoskeletal: 5.1 x 3.8 cm right iliopsoas fluid collection/abscess (series 2/image 78), previously 5.3 x 3.5 cm, grossly unchanged.  Mild body wall edema.  Degenerative changes of the visualized thoracolumbar spine.  IMPRESSION: Motion degraded images.  5.1 x 3.8 cm right iliopsoas fluid collection/abscess, grossly unchanged.  Small bilateral pleural effusions. Moderate abdominopelvic ascites. Mild body wall edema.  No evidence of bowel obstruction. Moderate distal colonic stool burden.   Electronically Signed   By: Julian Hy M.D.   On: 05/10/2014 15:45   Dg Chest Port 1 View  05/11/2014   CLINICAL DATA:  Altered mental status, history of hypertension, atrial fibrillation, diabetes.  EXAM: PORTABLE CHEST - 1 VIEW  COMPARISON:  Portable chest x-ray of May 08, 2014  FINDINGS: The lungs are reasonably well inflated and exhibit coarse interstitial markings bilaterally especially in the right infrahilar region. There is no pleural effusion. The cardiac silhouette is mildly enlarged. The  mediastinum is normal in width. There is dense calcification in the wall of the ascending and proximal descending thoracic aorta.  IMPRESSION: Subsegmental atelectasis or early interstitial infiltrate in the right infrahilar region. This could be related to aspiration. There is no pulmonary edema.   Electronically Signed   By: David  Martinique M.D.   On: 05/11/2014 09:40    Assessment/Plan:  1) Psoas abscess - Strep in BCx, Strep viridans in abscess.  Continue with ceftiraxone. Will stop vancomycin Will be able to go home on oral therapy for Strep  Dr. Megan Salon available over the weekend if needed    Scharlene Gloss, [place] [facility] [place] [email or web address] [phone] pager   [phone] cell 05/11/2014, 3:16 PM

## 2014-05-11 NOTE — Progress Notes (Signed)
*  PRELIMINARY RESULTS* Vascular Ultrasound Lower extremity venous duplex has been completed.  Preliminary findings: negative for DVT  Landry Mellow, RDMS, RVT  05/11/2014, 11:04 AM

## 2014-05-12 ENCOUNTER — Inpatient Hospital Stay (HOSPITAL_COMMUNITY): Payer: Medicare Other

## 2014-05-12 ENCOUNTER — Encounter (HOSPITAL_COMMUNITY): Payer: Medicare Other

## 2014-05-12 DIAGNOSIS — A409 Streptococcal sepsis, unspecified: Secondary | ICD-10-CM

## 2014-05-12 DIAGNOSIS — I639 Cerebral infarction, unspecified: Secondary | ICD-10-CM

## 2014-05-12 DIAGNOSIS — S065XAA Traumatic subdural hemorrhage with loss of consciousness status unknown, initial encounter: Secondary | ICD-10-CM

## 2014-05-12 DIAGNOSIS — I63412 Cerebral infarction due to embolism of left middle cerebral artery: Secondary | ICD-10-CM

## 2014-05-12 DIAGNOSIS — S065X9A Traumatic subdural hemorrhage with loss of consciousness of unspecified duration, initial encounter: Secondary | ICD-10-CM

## 2014-05-12 HISTORY — DX: Traumatic subdural hemorrhage with loss of consciousness of unspecified duration, initial encounter: S06.5X9A

## 2014-05-12 HISTORY — DX: Traumatic subdural hemorrhage with loss of consciousness status unknown, initial encounter: S06.5XAA

## 2014-05-12 LAB — BASIC METABOLIC PANEL
ANION GAP: 8 (ref 5–15)
BUN: 20 mg/dL (ref 6–20)
CHLORIDE: 120 mmol/L — AB (ref 101–111)
CO2: 19 mmol/L — ABNORMAL LOW (ref 22–32)
Calcium: 8.7 mg/dL — ABNORMAL LOW (ref 8.9–10.3)
Creatinine, Ser: 0.75 mg/dL (ref 0.61–1.24)
GFR calc non Af Amer: >60 mL/min (ref >60)
Glucose, Bld: 131 mg/dL — ABNORMAL HIGH (ref 70–99)
POTASSIUM: 4.1 mmol/L (ref 3.5–5.1)
SODIUM: 147 mmol/L — AB (ref 135–145)

## 2014-05-12 LAB — HEPATIC FUNCTION PANEL
ALBUMIN: 3 g/dL — AB (ref 3.5–5.0)
ALK PHOS: 115 U/L (ref 38–126)
ALT: 54 U/L (ref 17–63)
AST: 50 U/L — ABNORMAL HIGH (ref 15–41)
BILIRUBIN INDIRECT: 0.9 mg/dL (ref 0.3–0.9)
Bilirubin, Direct: 0.4 mg/dL (ref 0.1–0.5)
Total Bilirubin: 1.3 mg/dL — ABNORMAL HIGH (ref 0.3–1.2)
Total Protein: 6.6 g/dL (ref 6.5–8.1)

## 2014-05-12 LAB — CBC
HEMATOCRIT: 40.2 % (ref 39.0–52.0)
Hemoglobin: 13.5 g/dL (ref 13.0–17.0)
MCH: 32.8 pg (ref 26.0–34.0)
MCHC: 33.6 g/dL (ref 30.0–36.0)
MCV: 97.8 fL (ref 78.0–100.0)
PLATELETS: 397 10*3/uL (ref 150–400)
RBC: 4.11 MIL/uL — AB (ref 4.22–5.81)
RDW: 14.6 % (ref 11.5–15.5)
WBC: 18.3 10*3/uL — ABNORMAL HIGH (ref 4.0–10.5)

## 2014-05-12 LAB — MAGNESIUM: MAGNESIUM: 2.2 mg/dL (ref 1.7–2.4)

## 2014-05-12 LAB — GLUCOSE, CAPILLARY
GLUCOSE-CAPILLARY: 134 mg/dL — AB (ref 70–99)
GLUCOSE-CAPILLARY: 160 mg/dL — AB (ref 70–99)
GLUCOSE-CAPILLARY: 186 mg/dL — AB (ref 70–99)
GLUCOSE-CAPILLARY: 140 mg/dL — AB (ref 70–99)
Glucose-Capillary: 118 mg/dL — ABNORMAL HIGH (ref 70–99)
Glucose-Capillary: 136 mg/dL — ABNORMAL HIGH (ref 70–99)

## 2014-05-12 LAB — RPR: RPR Ser Ql: NONREACTIVE

## 2014-05-12 LAB — HIV ANTIBODY (ROUTINE TESTING W REFLEX): HIV Screen 4th Generation wRfx: NONREACTIVE

## 2014-05-12 NOTE — Progress Notes (Signed)
[facility] PROGRESS NOTE  [name] NLZ:[id number] DOB: 1935/03/12 DOA: 05/04/2014 PCP: Lujean Amel, MD  Assessment/Plan: #1 sepsis Patient on presentation had a temperature 101.5  increased heart rate and new onset A. Fib in the presence of infection. Blood cultures with 1 out of 2 growing streptococcal bovis. Right iliopsoas has been aspirated and cultures are consistent with Streptococcus very dense. Repeat CT of the abdomen and pelvis with no significant change in iliopsoas abscess. Will need to discuss with surgery. Patient still with fevers. Continue empiric IV Rocephin. Patient has been seen by ID.  #2 acute encephalopathy/subdural hematoma/small CVA Multifactorial in nature secondary to acute infection with saws abscess, streptococcal bovis bacteremia, probable alcohol withdrawal, small subdural hematoma and small CVA noted on MRI of the head .patient not alert enough to do a swallow evaluation. Patient pulled out pending 2. ABG showed a metabolic alkalosis. B-12 levels within normal limits at 894. CT angiogram of the chest which was done was negative for PE. CT head was negative. MRI was consistent with a small CVA and tiny subdural hematoma. EEG was negative for seizure activity. RPR was nonreactive. HIV was nonreactive. Continue current IV antibiotics. Patient has had a 2-D echo and a such will check carotid Dopplers. Unable to place on antiplatelet therapy secondary to subdural hematoma. Will discontinue Lovenox. Follow. Neurology following and appreciate input and recommendations.   #3 streptococcal bovis bacteremia Likely seeded from the GI tract. Patient has been seen in consultation by infectious disease and recommended narrowing antibiotics. Concern also for colorectal malignancy. CT of the abdomen and pelvis pending. Patient will likely need a colonoscopy as outpatient. Patient currently on IV vancomycin and IV Rocephin. ID following and appreciate input and  recommendations.  #4 iliopsoas abscess with moderate viridans streptococcus Status post aspiration. Cultures with moderate viridans streptococcus. Continue empiric IV vancomycin and IV Rocephin. ID following. Follow.  #5 fever Likely secondary to problems #2 and 3. Concern for possible hip infection. CT abdomen and pelvis with no significant change and so was abscess. Continue empiric IV antibiotics for now. ID following.  #6 New Onset atrial fibrillation Likely secondary to iliopsoas abscess and bacteremia.  TSH within normal limits. HR 90s this morning. 2-D echo with EF of 60-65% with moderate LVH, aortic sclerosis with trivial AI, mild RAE, moderate MR and TR, dilated IVC. Patient currently on a Cardizem drip. Once more alert may transition to oral long-acting Cardizem for rate control. Patient with a CHADSVASC score 4. Patient likely needs anticoagulation however due to history of chronic alcoholism question of compliance. Cardiology following and appreciate input and recommendations.  #7 hypertension Currently on Cardizem. When patient is more alert will transition to long-acting Cardizem.  #8 hypokalemia/hypomagnesemia Repleted.  #9 dm 2 Currently NPO secondary to altered MS. CBGs have ranged from 134-186.sliding scale insulin.  #10 history of alcohol abuse Patient now minimally responsive. Moans to noxious stimuli. Ativan as needed for agitation. Appreciate critical care input.  #11 nutrition Patient removed Panda tube. If patient does not become alert in the next 24-48 hours may need to repeat Panda.  #12 prophylaxis SCDs for DVT prophylaxis.  Code Status: full Family Communication: Updated daughter at bedside. phone [facility]) Disposition Plan: remain in the step down unit.   Consultants:  Gen. Surgery: Dr. Lucia Gaskins 05/04/2014  Cardiology: Dr. Jacinta Shoe 05/05/2014  Orthopedics: Dr. Berenice Primas 05/08/2014  PCCM: Dr. Elsworth Soho 05/09/2014  Infectious disease: Dr.  Linus Salmons 05/09/2014  Neurology: Dr. Aram Beecham 05/12/2014  Procedures:   CT  pelvis 05/04/2014   plain films of the right hip and pelvis 05/04/2014   2-D echo 05/06/2014  Chest x-ray 05/08/2014, 05/06/2014, 05/04/2014  X-ray of the right knee 05/04/2014  CT aspiration 5 mL of bloody purulent material 05/07/2014  CT abdomen and pelvis 05/10/2014  Chest x-ray 05/11/2014  CT head 05/11/2014  CT angiogram chest 05/11/2014  MRI head 05/11/2014  Antibiotics:   IV vancomycin   05/04/2014>>>>> 05/11/2014  IV Zosyn  05/04/2014>>>> 05/09/2014   IV Rocephin 05/09/2014  HPI/Subjective:  patient minimally responsive to verbal stimuli. Moans and groans to noxious stimuli. Open eyes and spoke a few words to daughter at bedside.  Objective: Filed Vitals:   05/12/14 0800  BP: 160/59  Pulse: 99  Temp: 98.7 F (37.1 C)  Resp: 26    Intake/Output Summary (Last 24 hours) at 05/12/14 0948 Last data filed at 05/12/14 0800  Gross per 24 hour  Intake 2171.67 ml  Output   1375 ml  Net 796.67 ml   Filed Weights   05/04/14 2319 05/10/14 0400 05/12/14 0400  Weight: 101.3 kg (223 lb 5.2 oz) 100.7 kg (222 lb 0.1 oz) 103.7 kg (228 lb 9.9 oz)    Exam:   General:   Patient moans to noxious stimuli. Dry mucous membranes.  Cardiovascular:  Irregularly irregular.  Respiratory:  Some  coarse sounds in anterior lung fields.Inreased RR.  Abdomen:  Soft, nontender, nondistended, positive bowel sounds.  Musculoskeletal:  No clubbing cyanosis or edema.  Data Reviewed: Basic Metabolic Panel:  Recent Labs Lab 05/08/14 1805 05/09/14 0400 05/09/14 0915 05/10/14 0345 05/11/14 0340 05/12/14 0355  NA 141 140  --  141 143 147*  K 2.4* 3.1*  --  3.4* 3.4* 4.1  CL 108 109  --  111 117* 120*  CO2 24 23  --  21* 19* 19*  GLUCOSE 153* 184*  --  156* 167* 131*  BUN 13 17  --  18 15 20   CREATININE 0.73 0.66  --  0.59* 0.62 0.75  CALCIUM 8.1* 7.8*  --  8.1* 8.2* 8.7*  MG  --   --  1.3* 1.9  1.7 2.2  PHOS  --   --  3.3  --   --   --    Liver Function Tests:  Recent Labs Lab 05/12/14 0355  AST 50*  ALT 54  ALKPHOS 115  BILITOT 1.3*  PROT 6.6  ALBUMIN 3.0*   No results for input(s): LIPASE, AMYLASE in the last 168 hours.  Recent Labs Lab 05/09/14 0915  AMMONIA 29   CBC:  Recent Labs Lab 05/08/14 0340 05/09/14 0400 05/10/14 0345 05/11/14 0340 05/12/14 0355  WBC 14.4* 18.3* 15.1* 16.7* 18.3*  HGB 13.0 14.1 14.3 13.7 13.5  HCT 38.6* 42.0 42.1 39.8 40.2  MCV 95.1 97.4 96.8 97.1 97.8  PLT 239 349 344 357 397   Cardiac Enzymes: No results for input(s): CKTOTAL, CKMB, CKMBINDEX, TROPONINI in the last 168 hours. BNP (last 3 results) No results for input(s): BNP in the last 8760 hours.  ProBNP (last 3 results) No results for input(s): PROBNP in the last 8760 hours.  CBG:  Recent Labs Lab 05/11/14 1655 05/11/14 2014 05/12/14 0030 05/12/14 0512 05/12/14 0833  GLUCAP 130* 153* 136* 118* 134*    Recent Results (from the past 240 hour(s))  Blood Culture (routine x 2)     Status: None   Collection Time: 05/04/14  3:00 PM  Result Value Ref Range Status   Specimen Description BLOOD LEFT  WRIST  5 ML IN [facility] BOTTLE  Final   Special Requests NONE  Final   Culture   Final    NO GROWTH 5 DAYS Performed at [facility]    Report Status 05/10/2014 FINAL  Final  Blood Culture (routine x 2)     Status: None   Collection Time: 05/04/14  4:21 PM  Result Value Ref Range Status   Specimen Description BLOOD RIGHT ANTECUBITAL  Final   Special Requests BOTTLES DRAWN AEROBIC AND ANAEROBIC 5CC  Final   Culture   Final    STREPTOCOCCUS GROUP D;high probability for S.bovis Note: Gram Stain Report Called to,Read Back By and Verified With: CAROLYN OAKLEY 4.30.16 BY MANGR 503PM Performed at [facility]    Report Status 05/08/2014 FINAL  Final   Organism ID, Bacteria STREPTOCOCCUS GROUP D;high probability for S.bovis  Final      Susceptibility    Streptococcus group d;high probability for s.bovis - MIC (ETEST)*    PENICILLIN .064 SENSITIVE Sensitive     * STREPTOCOCCUS GROUP D;high probability for S.bovis  Urine culture     Status: None   Collection Time: 05/04/14  5:51 PM  Result Value Ref Range Status   Specimen Description URINE, CLEAN CATCH  Final   Special Requests NONE  Final   Colony Count   Final    7,000 COLONIES/ML Performed at [facility]    Culture   Final    INSIGNIFICANT GROWTH Performed at [facility]    Report Status 05/05/2014 FINAL  Final  MRSA PCR Screening     Status: None   Collection Time: 05/04/14 11:24 PM  Result Value Ref Range Status   MRSA by PCR NEGATIVE NEGATIVE Final    Comment:        The GeneXpert MRSA Assay (FDA approved for NASAL specimens only), is one component of a comprehensive MRSA colonization surveillance program. It is not intended to diagnose MRSA infection nor to guide or monitor treatment for MRSA infections.   Culture, routine-abscess     Status: None   Collection Time: 05/07/14  2:29 PM  Result Value Ref Range Status   Specimen Description ABSCESS  Final   Special Requests Normal  Final   Gram Stain   Final    ABUNDANT WBC PRESENT,BOTH PMN AND MONONUCLEAR NO SQUAMOUS EPITHELIAL CELLS SEEN NO ORGANISMS SEEN Performed at [facility]    Culture   Final    MODERATE VIRIDANS STREPTOCOCCUS Performed at [facility]    Report Status 05/11/2014 FINAL  Final  Culture, blood (routine x 2)     Status: None (Preliminary result)   Collection Time: 05/08/14  7:00 PM  Result Value Ref Range Status   Specimen Description BLOOD RIGHT HAND  Final   Special Requests BOTTLES DRAWN AEROBIC AND ANAEROBIC 10CC  Final   Culture   Final           BLOOD CULTURE RECEIVED NO GROWTH TO DATE CULTURE WILL BE HELD FOR 5 DAYS BEFORE ISSUING A FINAL NEGATIVE REPORT Performed at [facility]    Report Status PENDING  Incomplete  Culture, blood  (routine x 2)     Status: None (Preliminary result)   Collection Time: 05/08/14  7:15 PM  Result Value Ref Range Status   Specimen Description BLOOD RIGHT ARM  Final   Special Requests BOTTLES DRAWN AEROBIC AND ANAEROBIC 10CC  Final   Culture   Final  BLOOD CULTURE RECEIVED NO GROWTH TO DATE CULTURE WILL BE HELD FOR 5 DAYS BEFORE ISSUING A FINAL NEGATIVE REPORT Performed at [facility]    Report Status PENDING  Incomplete     Studies: Dg Abd 1 View  05/11/2014   CLINICAL DATA:  79 year old male with a history of nasogastric tube placement  EXAM: ABDOMEN - 1 VIEW  COMPARISON:  None.  FINDINGS: Gas within small bowel and colon. No abnormally distended small bowel loops.  Tip with surgical tubing projects within the mid mediastinum, below the level of the carina, potentially in the distal esophagus. No gastric tube within the left upper quadrant.  Calcifications of the aorta.  Formed stool within the rectum.  Surgical changes of right hip arthroplasty  IMPRESSION: Tip of gastric tube projects within the distal esophagus, below the level of the carina though not in the stomach.  Atherosclerosis.  Unremarkable bowel gas pattern with formed stool in the rectum, better characterized on recent prior CT.  Signed,  Dulcy Fanny. Earleen Newport, DO  [facility]  [facility]   Electronically Signed   By: Corrie Mckusick D.O.   On: 05/11/2014 12:32   Ct Head Wo Contrast  05/11/2014   CLINICAL DATA:  Encephalopathy, agitation  EXAM: CT HEAD WITHOUT CONTRAST  TECHNIQUE: Contiguous axial images were obtained from the base of the skull through the vertex without intravenous contrast.  COMPARISON:  None.  FINDINGS: Motion degraded images.  No evidence of parenchymal hemorrhage or extra-axial fluid collection. No mass lesion, mass effect, or midline shift.  No CT evidence of acute infarction.  Subcortical white matter and periventricular small vessel ischemic changes.   Global cortical atrophy.  Secondary ventricular prominence.  The visualized paranasal sinuses are essentially clear. The mastoid air cells are unopacified. Nasoenteric tube.  No evidence of calvarial fracture.  IMPRESSION: Motion degraded images.  No evidence of acute intracranial abnormality.  Atrophy with small vessel ischemic changes.   Electronically Signed   By: Julian Hy M.D.   On: 05/11/2014 15:49   Ct Angio Chest Pe W/cm &/or Wo Cm  05/11/2014   CLINICAL DATA:  Tachycardia, fevers  EXAM: CT ANGIOGRAPHY CHEST WITH CONTRAST  TECHNIQUE: Multidetector CT imaging of the chest was performed using the standard protocol during bolus administration of intravenous contrast. Multiplanar CT image reconstructions and MIPs were obtained to evaluate the vascular anatomy.  CONTRAST:  157mL OMNIPAQUE IOHEXOL 350 MG/ML SOLN  COMPARISON:  Plain film from earlier in the same day.  FINDINGS: Images are significantly limited by patient motion artifact. The lungs show mild dependent atelectasis. No focal confluent infiltrate is seen. Small bilateral pleural effusions are noted. A nasogastric catheter is noted in place. The thoracic aorta demonstrates a normal branching pattern is well as significant calcific plaque. No dissection or aneurysmal dilatation is seen.  The pulmonary artery shows no large central pulmonary embolus. Evaluation of the smaller vessels is limited due to patient motion artifact. No significant hilar or mediastinal adenopathy is noted. Coronary calcifications are seen. Scanning into the upper abdomen demonstrates mild ascites. The osseous structures show no acute abnormality.  Review of the MIP images confirms the above findings.  IMPRESSION: Severely limited exam. Mild dependent atelectasis and small effusions are seen.  Mild ascites is noted.  No definitive pulmonary emboli are seen.   Electronically Signed   By: Inez Catalina M.D.   On: 05/11/2014 15:50   Mr Brain Wo Contrast  05/11/2014    ADDENDUM REPORT:  05/11/2014 21:44  ADDENDUM: Study discussed by telephone with Provider TRogue Bussing on 05/11/2014 at 2140 hours.  In discussion of the findings, it occurs to me that the small cortical focus of diffusion abnormality along the left frontal lobe might alternatively reflect a small cerebral contusion (on the same side as the small left subdural hematoma).  I inquired about the possibility of recent fall, but Provider Rogue Bussing was not aware of and did not immediately find documentation of any recent trauma.   Electronically Signed   By: Genevie Ann M.D.   On: 05/11/2014 21:44   05/11/2014   CLINICAL DATA:  79 year old male with increased agitation and confusion. Acute encephalopathy. Tachycardia and fever. Right iliopsoas abscess. Initial encounter.  EXAM: MRI HEAD WITHOUT CONTRAST  TECHNIQUE: Multiplanar, multiecho pulse sequences of the brain and surrounding structures were obtained without intravenous contrast.  COMPARISON:  Head CT without contrast 1525 hours today.  FINDINGS: Study is intermittently degraded by motion artifact despite repeated imaging attempts.  Cerebral volume is within normal limits for age. No ventriculomegaly. No midline shift or significant intracranial mass effect.  Subtle left subdural hematoma is identified (series 7, images 15 and 16). This is up to 2 mm in thickness. There is mild associated diffusion artifact (series 402, image 17). However, there is a smaller more intense focus of increased trace diffusion signal along the left frontal operculum on series 402, image 20. This is also seen on coronal diffusion (series 5, image 27). No associated T2 or FLAIR hyperintensity.  No other diffusion abnormality. Major intracranial vascular flow voids are within normal limits.  No other intracranial hemorrhage is identified. Patchy nonspecific periventricular white matter T2 and FLAIR hyperintensity corresponds hypodensity on the recent CT. No temporal lobe edema or signal abnormality  is identified. Deep gray matter nuclei, brainstem and cerebellum are within normal limits for age. Negative pituitary, cervicomedullary junction and visualized cervical spine.  Visible internal auditory structures appear normal. Mastoids are clear. Trace paranasal sinus mucosal thickening. Postoperative changes to the right globe. Visualized scalp soft tissues are within normal limits. Visualized bone marrow signal is within normal limits.  IMPRESSION: 1. Tiny left subdural hematoma, up to 2 mm in thickness. No associated mass effect. This is currently occult by CT. 2. Evidence of a tiny cortically based infarct in the low anterior left MCA territory. No mass effect or associated hemorrhage. 3. Mild to moderate for age nonspecific cerebral white matter signal changes  Electronically Signed: By: Genevie Ann M.D. On: 05/11/2014 21:31   Ct Abdomen Pelvis W Contrast  05/10/2014   CLINICAL DATA:  Fever, follow-up iliopsoas abscess. History of right hip replacement.  EXAM: CT ABDOMEN AND PELVIS WITH CONTRAST  TECHNIQUE: Multidetector CT imaging of the abdomen and pelvis was performed using the standard protocol following bolus administration of intravenous contrast.  CONTRAST:  147mL OMNIPAQUE IOHEXOL 300 MG/ML  SOLN  COMPARISON:  CT pelvis dated 05/04/2014  FINDINGS: Motion degraded images.  Lower chest:  Small bilateral pleural effusions.  Cardiomegaly.  Coronary atherosclerosis.  Hepatobiliary: Liver is grossly unremarkable.  Gallbladder is severely motion degraded. No intrahepatic or extrahepatic ductal dilatation.  Pancreas: Within normal limits.  Spleen: Severely motion degraded but grossly unremarkable.  Adrenals/Urinary Tract: Adrenal glands are motion degraded but unremarkable.  Kidneys are grossly unremarkable.  No hydronephrosis.  Bladder is within normal limits.  Stomach/Bowel: Stomach is poorly evaluated but notable for a small hiatal hernia.  No evidence of bowel obstruction.  Colonic diverticulosis.  Moderate  distal  colonic stool burden.  Vascular/Lymphatic: Atherosclerotic calcifications of the abdominal aorta and branch vessels.  No suspicious abdominopelvic lymphadenopathy  Reproductive: Prostate is grossly unremarkable  Other: Moderate abdominopelvic ascites.  Small fat containing bilateral inguinal hernias.  Musculoskeletal: 5.1 x 3.8 cm right iliopsoas fluid collection/abscess (series 2/image 78), previously 5.3 x 3.5 cm, grossly unchanged.  Mild body wall edema.  Degenerative changes of the visualized thoracolumbar spine.  IMPRESSION: Motion degraded images.  5.1 x 3.8 cm right iliopsoas fluid collection/abscess, grossly unchanged.  Small bilateral pleural effusions. Moderate abdominopelvic ascites. Mild body wall edema.  No evidence of bowel obstruction. Moderate distal colonic stool burden.   Electronically Signed   By: Julian Hy M.D.   On: 05/10/2014 15:45   Dg Chest Port 1 View  05/11/2014   CLINICAL DATA:  Altered mental status, history of hypertension, atrial fibrillation, diabetes.  EXAM: PORTABLE CHEST - 1 VIEW  COMPARISON:  Portable chest x-ray of May 08, 2014  FINDINGS: The lungs are reasonably well inflated and exhibit coarse interstitial markings bilaterally especially in the right infrahilar region. There is no pleural effusion. The cardiac silhouette is mildly enlarged. The mediastinum is normal in width. There is dense calcification in the wall of the ascending and proximal descending thoracic aorta.  IMPRESSION: Subsegmental atelectasis or early interstitial infiltrate in the right infrahilar region. This could be related to aspiration. There is no pulmonary edema.   Electronically Signed   By: David  Martinique M.D.   On: 05/11/2014 09:40    Scheduled Meds: . acetylcysteine  4 mL Nebulization TID  . antiseptic oral rinse  7 mL Mouth Rinse BID  . cefTRIAXone (ROCEPHIN)  IV  2 g Intravenous Q24H  . febuxostat  80 mg Oral Daily  . feeding supplement (PRO-STAT SUGAR FREE 64)  30 mL Per  Tube BID  . folic acid  1 mg Oral Daily  . folic acid  1 mg Intravenous Daily  . hydrALAZINE  10 mg Intravenous Q4H  . insulin aspart  0-9 Units Subcutaneous Q4H  . metoprolol  10 mg Intravenous 4 times per day  . metoprolol  5 mg Intravenous Once  . multivitamin with minerals  1 tablet Oral Daily  . thiamine  100 mg Oral Daily   Or  . thiamine  100 mg Intravenous Daily   Continuous Infusions: . dextrose 5 % and 0.45% NaCl 1,000 mL with potassium chloride 40 mEq infusion 75 mL/hr at 05/11/14 2347  . diltiazem (CARDIZEM) infusion 15 mg/hr (05/12/14 0800)  . feeding supplement (GLUCERNA 1.2 CAL) Stopped (05/11/14 1830)    Principal Problem:   Sepsis Active Problems:   Streptococcal bacteremia   Acute encephalopathy   Fever   Psoas abscess   New onset atrial fibrillation   Diabetes mellitus type 2, controlled   Essential hypertension   Gout   Psoas abscess, right   Iliopsoas abscess on right   Atrial fibrillation with rapid ventricular response   Alcoholism   Type 2 diabetes mellitus without complication   Pyrexia   Abscess of male pelvis   Right hip pain   Cerebral infarction due to embolism of left middle cerebral artery   CVA (cerebral infarction)   SDH (subdural hematoma)    Time spent: 47 mins    [facility]  MD [facility] Pager [phone]. If 7PM-7AM, please contact night-coverage at [email or web address], password [facility] 05/12/2014, 9:48 AM  LOS: 8 days            5

## 2014-05-12 NOTE — Consult Note (Signed)
Referring Physician: Dr. Grandville Silos    Chief Complaint: altered mental status  HPI:                                                                                                                                         [name] is an 79 y.o. male with a past medical history significant for HTN, DM type 2, arthritis, alcoholism, and gout, admitted to [facility] on 4/29 due to right lower quadrant pain and was found to have an iliopsoas abscess s/p aspiration, strep bovis bacteremia, and sepsis. Patient is non communicative and thus all clinical information was obtained from the hospital record. Clinical course complicated by new onset atrial fibrillation and encephalopathy. Had CT brain that showed no acute abnormality and due to persistent encephalopathy MRI brain was obtained which revealed a tiny left subdural hematoma, up to 2 mm in thickness without associated mass effect as well a smaller more intense focus of increased trace diffusion signal along the left frontal operculum suggestive of a tiny cortical infarct versus contusion. EEG showed no electrographic seizures but findings suggestive of non specific cerebral dysfunction. Presently, patient open his eyes but is non communicative and doesn't follow commands either. Serologies significant for Na 147, WBC 18.3   Date last known well: unable to determine Time last known well: unable to determine tPA Given: no, out of the window   Past Medical History  Diagnosis Date  . Hypertension   . Arthritis     Past Surgical History  Procedure Laterality Date  . Hip arthroplasty Right   . Joint replacement      right hip, bilateral knees  . Right cataract extraction      Family History  Problem Relation Age of Onset  . Diabetes Mellitus II Daughter    Social History:  reports that he has quit smoking. His smoking use included Cigarettes. He smoked 25.00 packs per day. He has never used smokeless tobacco. He reports that he drinks alcohol.  He reports that he does not use illicit drugs. Family history: unable to obtain due to mental status Allergies:  Allergies  Allergen Reactions  . Allopurinol Other (See Comments)    Makes head 'feel funny'  . Motrin [Ibuprofen] Other (See Comments)    Blisters in groin area    Medications:  Scheduled: . acetylcysteine  4 mL Nebulization TID  . antiseptic oral rinse  7 mL Mouth Rinse BID  . cefTRIAXone (ROCEPHIN)  IV  2 g Intravenous Q24H  . enoxaparin (LOVENOX) injection  40 mg Subcutaneous Daily  . febuxostat  80 mg Oral Daily  . feeding supplement (PRO-STAT SUGAR FREE 64)  30 mL Per Tube BID  . folic acid  1 mg Oral Daily  . folic acid  1 mg Intravenous Daily  . hydrALAZINE  10 mg Intravenous Q4H  . insulin aspart  0-9 Units Subcutaneous Q4H  . metoprolol  10 mg Intravenous 4 times per day  . metoprolol  5 mg Intravenous Once  . multivitamin with minerals  1 tablet Oral Daily  . thiamine  100 mg Oral Daily   Or  . thiamine  100 mg Intravenous Daily    ROS: unable to obtain due to mental status.                                                                                                                                      History obtained from chart review   Physical exam: pleasant male in no apparent distress. Blood pressure 113/60, pulse 94, temperature 99.1 F (37.3 C), temperature source Oral, resp. rate 27, height 5' 7"  (1.702 m), weight 100.7 kg (222 lb 0.1 oz), SpO2 96 %. Head: normocephalic. Neck: supple, no bruits, no JVD. Cardiac: no murmurs. Lungs: clear. Abdomen: soft, no tender, no mass. Extremities: no edema, clubbing, or cyanosis. Skin: no rash  Neurologic Examination:                                                                                                      Mental status: poorly responsive, open eyes to verbal commands  but is inattentive and non communicative. CN 2-12: pupils 4 mm bilaterally, reactive to light. EOM full without nystagmus. Blinks to threat. Face appears symmetric. Tongue midline. Motor: moves all limbs spontaneously and symmetrically. Sensory: reacts to noxious stimuli. DTR's: 2 all over. Plantars: downgoing. Coordination and gait: unable to test due to mental status. No meningeal signs.   Results for orders placed or performed during the hospital encounter of 05/04/14 (from the past 48 hour(s))  Glucose, capillary     Status: Abnormal   Collection Time: 05/10/14  7:56 AM  Result Value Ref Range   Glucose-Capillary 148 (H) 70 - 99 mg/dL   Comment 1 Notify RN    Comment 2 Document in Chart  Glucose, capillary     Status: Abnormal   Collection Time: 05/10/14 12:15 PM  Result Value Ref Range   Glucose-Capillary 148 (H) 70 - 99 mg/dL   Comment 1 Notify RN    Comment 2 Document in Chart   Vancomycin, trough     Status: None   Collection Time: 05/10/14  3:35 PM  Result Value Ref Range   Vancomycin Tr 15 10.0 - 20.0 ug/mL    Comment: Performed at [facility]  Glucose, capillary     Status: Abnormal   Collection Time: 05/10/14  4:56 PM  Result Value Ref Range   Glucose-Capillary 165 (H) 70 - 99 mg/dL   Comment 1 Notify RN   Glucose, capillary     Status: Abnormal   Collection Time: 05/10/14  8:17 PM  Result Value Ref Range   Glucose-Capillary 139 (H) 70 - 99 mg/dL   Comment 1 Notify RN    Comment 2 Document in Chart   Glucose, capillary     Status: Abnormal   Collection Time: 05/10/14 11:31 PM  Result Value Ref Range   Glucose-Capillary 154 (H) 70 - 99 mg/dL   Comment 1 Notify RN    Comment 2 Document in Chart   Basic metabolic panel     Status: Abnormal   Collection Time: 05/11/14  3:40 AM  Result Value Ref Range   Sodium 143 135 - 145 mmol/L   Potassium 3.4 (L) 3.5 - 5.1 mmol/L   Chloride 117 (H) 101 - 111 mmol/L   CO2 19 (L) 22 - 32 mmol/L   Glucose, Bld 167  (H) 70 - 99 mg/dL   BUN 15 6 - 20 mg/dL   Creatinine, Ser 0.62 0.61 - 1.24 mg/dL   Calcium 8.2 (L) 8.9 - 10.3 mg/dL   GFR calc non Af Amer >60 >60 mL/min   GFR calc Af Amer >60 >60 mL/min    Comment: (NOTE) The eGFR has been calculated using the CKD EPI equation. This calculation has not been validated in all clinical situations. eGFR's persistently <60 mL/min signify possible Chronic Kidney Disease.    Anion gap 7 5 - 15  CBC     Status: Abnormal   Collection Time: 05/11/14  3:40 AM  Result Value Ref Range   WBC 16.7 (H) 4.0 - 10.5 K/uL   RBC 4.10 (L) 4.22 - 5.81 MIL/uL   Hemoglobin 13.7 13.0 - 17.0 g/dL   HCT 39.8 39.0 - 52.0 %   MCV 97.1 78.0 - 100.0 fL   MCH 33.4 26.0 - 34.0 pg   MCHC 34.4 30.0 - 36.0 g/dL   RDW 14.5 11.5 - 15.5 %   Platelets 357 150 - 400 K/uL  Magnesium     Status: None   Collection Time: 05/11/14  3:40 AM  Result Value Ref Range   Magnesium 1.7 1.7 - 2.4 mg/dL  Glucose, capillary     Status: Abnormal   Collection Time: 05/11/14  4:33 AM  Result Value Ref Range   Glucose-Capillary 153 (H) 70 - 99 mg/dL  Glucose, capillary     Status: Abnormal   Collection Time: 05/11/14  7:52 AM  Result Value Ref Range   Glucose-Capillary 146 (H) 70 - 99 mg/dL   Comment 1 Notify RN    Comment 2 Document in Chart   Blood gas, arterial     Status: Abnormal   Collection Time: 05/11/14 10:07 AM  Result Value Ref Range   FIO2 0.21 %  pH, Arterial 7.494 (H) 7.350 - 7.450   pCO2 arterial 23.4 (L) 35.0 - 45.0 mmHg   pO2, Arterial 65.3 (L) 80.0 - 100.0 mmHg   Bicarbonate 17.8 (L) 20.0 - 24.0 mEq/L   TCO2 15.5 0 - 100 mmol/L   Acid-base deficit 3.5 (H) 0.0 - 2.0 mmol/L   O2 Saturation 92.7 %   Patient temperature 98.6    Collection site RIGHT RADIAL    Drawn by [id number]    Sample type ARTERIAL DRAW    Allens test (pass/fail) PASS PASS  Vitamin B12     Status: None   Collection Time: 05/11/14 10:17 AM  Result Value Ref Range   Vitamin B-12 894 180 - 914 pg/mL     Comment: (NOTE) This assay is not validated for testing neonatal or myeloproliferative syndrome specimens for Vitamin B12 levels. Performed at [facility]   RPR     Status: None   Collection Time: 05/11/14 10:17 AM  Result Value Ref Range   RPR Ser Ql Non Reactive Non Reactive    Comment: (NOTE) Performed At: [facility] [place], [place] [id number] Lindon Romp MD [id number]   Lactic acid, plasma     Status: None   Collection Time: 05/11/14 10:17 AM  Result Value Ref Range   Lactic Acid, Venous 1.4 0.5 - 2.0 mmol/L  D-dimer, quantitative     Status: Abnormal   Collection Time: 05/11/14 10:17 AM  Result Value Ref Range   D-Dimer, Quant 6.32 (H) 0.00 - 0.48 ug/mL-FEU    Comment:        AT THE INHOUSE ESTABLISHED CUTOFF VALUE OF 0.48 ug/mL FEU, THIS ASSAY HAS BEEN DOCUMENTED IN THE LITERATURE TO HAVE A SENSITIVITY AND NEGATIVE PREDICTIVE VALUE OF AT LEAST 98 TO 99%.  THE TEST RESULT SHOULD BE CORRELATED WITH AN ASSESSMENT OF THE CLINICAL PROBABILITY OF DVT / VTE.   Glucose, capillary     Status: Abnormal   Collection Time: 05/11/14  1:22 PM  Result Value Ref Range   Glucose-Capillary 161 (H) 70 - 99 mg/dL   Comment 1 Notify RN   Glucose, capillary     Status: Abnormal   Collection Time: 05/11/14  4:55 PM  Result Value Ref Range   Glucose-Capillary 130 (H) 70 - 99 mg/dL   Comment 1 Notify RN   Glucose, capillary     Status: Abnormal   Collection Time: 05/11/14  8:14 PM  Result Value Ref Range   Glucose-Capillary 153 (H) 70 - 99 mg/dL  Glucose, capillary     Status: Abnormal   Collection Time: 05/12/14 12:30 AM  Result Value Ref Range   Glucose-Capillary 136 (H) 70 - 99 mg/dL  Basic metabolic panel     Status: Abnormal   Collection Time: 05/12/14  3:55 AM  Result Value Ref Range   Sodium 147 (H) 135 - 145 mmol/L   Potassium 4.1 3.5 - 5.1 mmol/L    Comment: DELTA CHECK NOTED REPEATED TO VERIFY    Chloride 120 (H) 101 - 111  mmol/L   CO2 19 (L) 22 - 32 mmol/L   Glucose, Bld 131 (H) 70 - 99 mg/dL   BUN 20 6 - 20 mg/dL   Creatinine, Ser 0.75 0.61 - 1.24 mg/dL   Calcium 8.7 (L) 8.9 - 10.3 mg/dL   GFR calc non Af Amer >60 >60 mL/min   GFR calc Af Amer >60 >60 mL/min    Comment: (NOTE) The eGFR has been calculated using  the CKD EPI equation. This calculation has not been validated in all clinical situations. eGFR's persistently <60 mL/min signify possible Chronic Kidney Disease.    Anion gap 8 5 - 15  CBC     Status: Abnormal   Collection Time: 05/12/14  3:55 AM  Result Value Ref Range   WBC 18.3 (H) 4.0 - 10.5 K/uL   RBC 4.11 (L) 4.22 - 5.81 MIL/uL   Hemoglobin 13.5 13.0 - 17.0 g/dL   HCT 40.2 39.0 - 52.0 %   MCV 97.8 78.0 - 100.0 fL   MCH 32.8 26.0 - 34.0 pg   MCHC 33.6 30.0 - 36.0 g/dL   RDW 14.6 11.5 - 15.5 %   Platelets 397 150 - 400 K/uL  Magnesium     Status: None   Collection Time: 05/12/14  3:55 AM  Result Value Ref Range   Magnesium 2.2 1.7 - 2.4 mg/dL  Hepatic function panel     Status: Abnormal   Collection Time: 05/12/14  3:55 AM  Result Value Ref Range   Total Protein 6.6 6.5 - 8.1 g/dL   Albumin 3.0 (L) 3.5 - 5.0 g/dL   AST 50 (H) 15 - 41 U/L   ALT 54 17 - 63 U/L   Alkaline Phosphatase 115 38 - 126 U/L   Total Bilirubin 1.3 (H) 0.3 - 1.2 mg/dL   Bilirubin, Direct 0.4 0.1 - 0.5 mg/dL   Indirect Bilirubin 0.9 0.3 - 0.9 mg/dL  Glucose, capillary     Status: Abnormal   Collection Time: 05/12/14  5:12 AM  Result Value Ref Range   Glucose-Capillary 118 (H) 70 - 99 mg/dL   Dg Abd 1 View  05/11/2014   CLINICAL DATA:  79 year old male with a history of nasogastric tube placement  EXAM: ABDOMEN - 1 VIEW  COMPARISON:  None.  FINDINGS: Gas within small bowel and colon. No abnormally distended small bowel loops.  Tip with surgical tubing projects within the mid mediastinum, below the level of the carina, potentially in the distal esophagus. No gastric tube within the left upper quadrant.   Calcifications of the aorta.  Formed stool within the rectum.  Surgical changes of right hip arthroplasty  IMPRESSION: Tip of gastric tube projects within the distal esophagus, below the level of the carina though not in the stomach.  Atherosclerosis.  Unremarkable bowel gas pattern with formed stool in the rectum, better characterized on recent prior CT.  Signed,  Dulcy Fanny. Earleen Newport, DO  [facility]  [facility]   Electronically Signed   By: Corrie Mckusick D.O.   On: 05/11/2014 12:32   Ct Head Wo Contrast  05/11/2014   CLINICAL DATA:  Encephalopathy, agitation  EXAM: CT HEAD WITHOUT CONTRAST  TECHNIQUE: Contiguous axial images were obtained from the base of the skull through the vertex without intravenous contrast.  COMPARISON:  None.  FINDINGS: Motion degraded images.  No evidence of parenchymal hemorrhage or extra-axial fluid collection. No mass lesion, mass effect, or midline shift.  No CT evidence of acute infarction.  Subcortical white matter and periventricular small vessel ischemic changes.  Global cortical atrophy.  Secondary ventricular prominence.  The visualized paranasal sinuses are essentially clear. The mastoid air cells are unopacified. Nasoenteric tube.  No evidence of calvarial fracture.  IMPRESSION: Motion degraded images.  No evidence of acute intracranial abnormality.  Atrophy with small vessel ischemic changes.   Electronically Signed   By: Julian Hy M.D.   On: 05/11/2014 15:49  Ct Angio Chest Pe W/cm &/or Wo Cm  05/11/2014   CLINICAL DATA:  Tachycardia, fevers  EXAM: CT ANGIOGRAPHY CHEST WITH CONTRAST  TECHNIQUE: Multidetector CT imaging of the chest was performed using the standard protocol during bolus administration of intravenous contrast. Multiplanar CT image reconstructions and MIPs were obtained to evaluate the vascular anatomy.  CONTRAST:  113m OMNIPAQUE IOHEXOL 350 MG/ML SOLN  COMPARISON:  Plain film from earlier in the same  day.  FINDINGS: Images are significantly limited by patient motion artifact. The lungs show mild dependent atelectasis. No focal confluent infiltrate is seen. Small bilateral pleural effusions are noted. A nasogastric catheter is noted in place. The thoracic aorta demonstrates a normal branching pattern is well as significant calcific plaque. No dissection or aneurysmal dilatation is seen.  The pulmonary artery shows no large central pulmonary embolus. Evaluation of the smaller vessels is limited due to patient motion artifact. No significant hilar or mediastinal adenopathy is noted. Coronary calcifications are seen. Scanning into the upper abdomen demonstrates mild ascites. The osseous structures show no acute abnormality.  Review of the MIP images confirms the above findings.  IMPRESSION: Severely limited exam. Mild dependent atelectasis and small effusions are seen.  Mild ascites is noted.  No definitive pulmonary emboli are seen.   Electronically Signed   By: MInez CatalinaM.D.   On: 05/11/2014 15:50   Mr Brain Wo Contrast  05/11/2014   ADDENDUM REPORT: 05/11/2014 21:44  ADDENDUM: Study discussed by telephone with Provider T.Rogue Bussingon 05/11/2014 at 2140 hours.  In discussion of the findings, it occurs to me that the small cortical focus of diffusion abnormality along the left frontal lobe might alternatively reflect a small cerebral contusion (on the same side as the small left subdural hematoma).  I inquired about the possibility of recent fall, but Provider CRogue Bussingwas not aware of and did not immediately find documentation of any recent trauma.   Electronically Signed   By: HGenevie AnnM.D.   On: 05/11/2014 21:44   05/11/2014   CLINICAL DATA:  79year old male with increased agitation and confusion. Acute encephalopathy. Tachycardia and fever. Right iliopsoas abscess. Initial encounter.  EXAM: MRI HEAD WITHOUT CONTRAST  TECHNIQUE: Multiplanar, multiecho pulse sequences of the brain and surrounding structures  were obtained without intravenous contrast.  COMPARISON:  Head CT without contrast 1525 hours today.  FINDINGS: Study is intermittently degraded by motion artifact despite repeated imaging attempts.  Cerebral volume is within normal limits for age. No ventriculomegaly. No midline shift or significant intracranial mass effect.  Subtle left subdural hematoma is identified (series 7, images 15 and 16). This is up to 2 mm in thickness. There is mild associated diffusion artifact (series 402, image 17). However, there is a smaller more intense focus of increased trace diffusion signal along the left frontal operculum on series 402, image 20. This is also seen on coronal diffusion (series 5, image 27). No associated T2 or FLAIR hyperintensity.  No other diffusion abnormality. Major intracranial vascular flow voids are within normal limits.  No other intracranial hemorrhage is identified. Patchy nonspecific periventricular white matter T2 and FLAIR hyperintensity corresponds hypodensity on the recent CT. No temporal lobe edema or signal abnormality is identified. Deep gray matter nuclei, brainstem and cerebellum are within normal limits for age. Negative pituitary, cervicomedullary junction and visualized cervical spine.  Visible internal auditory structures appear normal. Mastoids are clear. Trace paranasal sinus mucosal thickening. Postoperative changes to the right globe. Visualized scalp soft tissues are within  normal limits. Visualized bone marrow signal is within normal limits.  IMPRESSION: 1. Tiny left subdural hematoma, up to 2 mm in thickness. No associated mass effect. This is currently occult by CT. 2. Evidence of a tiny cortically based infarct in the low anterior left MCA territory. No mass effect or associated hemorrhage. 3. Mild to moderate for age nonspecific cerebral white matter signal changes  Electronically Signed: By: Genevie Ann M.D. On: 05/11/2014 21:31   Ct Abdomen Pelvis W Contrast  05/10/2014    CLINICAL DATA:  Fever, follow-up iliopsoas abscess. History of right hip replacement.  EXAM: CT ABDOMEN AND PELVIS WITH CONTRAST  TECHNIQUE: Multidetector CT imaging of the abdomen and pelvis was performed using the standard protocol following bolus administration of intravenous contrast.  CONTRAST:  174m OMNIPAQUE IOHEXOL 300 MG/ML  SOLN  COMPARISON:  CT pelvis dated 05/04/2014  FINDINGS: Motion degraded images.  Lower chest:  Small bilateral pleural effusions.  Cardiomegaly.  Coronary atherosclerosis.  Hepatobiliary: Liver is grossly unremarkable.  Gallbladder is severely motion degraded. No intrahepatic or extrahepatic ductal dilatation.  Pancreas: Within normal limits.  Spleen: Severely motion degraded but grossly unremarkable.  Adrenals/Urinary Tract: Adrenal glands are motion degraded but unremarkable.  Kidneys are grossly unremarkable.  No hydronephrosis.  Bladder is within normal limits.  Stomach/Bowel: Stomach is poorly evaluated but notable for a small hiatal hernia.  No evidence of bowel obstruction.  Colonic diverticulosis.  Moderate distal colonic stool burden.  Vascular/Lymphatic: Atherosclerotic calcifications of the abdominal aorta and branch vessels.  No suspicious abdominopelvic lymphadenopathy  Reproductive: Prostate is grossly unremarkable  Other: Moderate abdominopelvic ascites.  Small fat containing bilateral inguinal hernias.  Musculoskeletal: 5.1 x 3.8 cm right iliopsoas fluid collection/abscess (series 2/image 78), previously 5.3 x 3.5 cm, grossly unchanged.  Mild body wall edema.  Degenerative changes of the visualized thoracolumbar spine.  IMPRESSION: Motion degraded images.  5.1 x 3.8 cm right iliopsoas fluid collection/abscess, grossly unchanged.  Small bilateral pleural effusions. Moderate abdominopelvic ascites. Mild body wall edema.  No evidence of bowel obstruction. Moderate distal colonic stool burden.   Electronically Signed   By: SJulian HyM.D.   On: 05/10/2014 15:45    Dg Chest Port 1 View  05/11/2014   CLINICAL DATA:  Altered mental status, history of hypertension, atrial fibrillation, diabetes.  EXAM: PORTABLE CHEST - 1 VIEW  COMPARISON:  Portable chest x-ray of May 08, 2014  FINDINGS: The lungs are reasonably well inflated and exhibit coarse interstitial markings bilaterally especially in the right infrahilar region. There is no pleural effusion. The cardiac silhouette is mildly enlarged. The mediastinum is normal in width. There is dense calcification in the wall of the ascending and proximal descending thoracic aorta.  IMPRESSION: Subsegmental atelectasis or early interstitial infiltrate in the right infrahilar region. This could be related to aspiration. There is no pulmonary edema.   Electronically Signed   By: David  JMartiniqueM.D.   On: 05/11/2014 09:40     Assessment: 79y.o. male admitted with iliopsoas abscess s/p aspiration, strep bovis bacteremia, and sepsis. Persistent altered mental status and new onset atrial fibrillation prompted MRI brain that revealed  a tiny left subdural hematoma, up to 2 mm in thickness without associated mass effect as well a smaller more intense focus of increased trace diffusion signal along the left frontal operculum suggestive of a tiny cortical infarct versus contusion. Encephalopathy seems to be multifactorial (toxic metabolic, small SDH and tiny cortical left frontal infarct versus contusion). Complete stroke work up. No  antiplatelets at this moment. Repeat CT brain in 48 hours to ensure stability small SDH Will follow up.  Stroke Risk Factors - age, HTN, DM type 2, arthritis, alcoholism,  Plan: 1. HgbA1c, fasting lipid panel 2. MRI, MRA  of the brain without contrast 3. Echocardiogram 4. Carotid dopplers 5. Prophylactic therapy-none at this moment due to small acute SDH 6. Risk factor modification 7. Telemetry monitoring 8. Frequent neuro checks 9. PT/OT SLP  Dorian Pod, MD [facility] [phone]  05/12/2014, 6:30 AM

## 2014-05-12 NOTE — Progress Notes (Signed)
Consulting cardiologist: Dr. Ena Dawley  Seen for followup: Atrial fibrillation  Subjective:    Does not answer questions or communicate with intent.  Objective:   Temp:  [98 F (36.7 C)-100 F (37.8 C)] 100 F (37.8 C) (05/07 0400) Pulse Rate:  [82-111] 94 (05/07 0500) Resp:  [15-43] 27 (05/07 0500) BP: (97-156)/(46-119) 113/60 mmHg (05/07 0500) SpO2:  [91 %-98 %] 96 % (05/07 0500) Weight:  [222 lb (100.699 kg)-228 lb 9.9 oz (103.7 kg)] 228 lb 9.9 oz (103.7 kg) (05/07 0400) Last BM Date: 05/11/14  Filed Weights   05/04/14 2319 05/10/14 0400 05/12/14 0400  Weight: 223 lb 5.2 oz (101.3 kg) 222 lb 0.1 oz (100.7 kg) 228 lb 9.9 oz (103.7 kg)    Intake/Output Summary (Last 24 hours) at 05/12/14 0720 Last data filed at 05/12/14 0600  Gross per 24 hour  Intake 2312.5 ml  Output   1575 ml  Net  737.5 ml    Telemetry: Persistent atrial fibrillation.  Exam:  General: Overweight male, tachypneic.  Lungs: Rhonchi, no wheezes.  Cardiac: Irregularly irregular.   Extremities: Mild edema.  Lab Results:  Basic Metabolic Panel:  Recent Labs Lab 05/10/14 0345 05/11/14 0340 05/12/14 0355  NA 141 143 147*  K 3.4* 3.4* 4.1  CL 111 117* 120*  CO2 21* 19* 19*  GLUCOSE 156* 167* 131*  BUN 18 15 20   CREATININE 0.59* 0.62 0.75  CALCIUM 8.1* 8.2* 8.7*  MG 1.9 1.7 2.2    Liver Function Tests:  Recent Labs Lab 05/12/14 0355  AST 50*  ALT 54  ALKPHOS 115  BILITOT 1.3*  PROT 6.6  ALBUMIN 3.0*    CBC:  Recent Labs Lab 05/10/14 0345 05/11/14 0340 05/12/14 0355  WBC 15.1* 16.7* 18.3*  HGB 14.3 13.7 13.5  HCT 42.1 39.8 40.2  MCV 96.8 97.1 97.8  PLT 344 357 397    Echocardiogram 05/06/2014: Study Conclusions  - Left ventricle: The cavity size was normal. Wall thickness was increased in a pattern of severe LVH. Systolic function was normal. The estimated ejection fraction was in the range of 60% to 65%. Wall motion was normal; there were no  regional wall motion abnormalities. The study is not technically sufficient to allow evaluation of LV diastolic function. - Aortic valve: Sclerosis without stenosis. There was trivial regurgitation. - Mitral valve: Mildly thickened leaflets . There was moderate regurgitation. - Left atrium: The atrium was at the upper limits of normal in size. - Right ventricle: The cavity size was moderately dilated. - Right atrium: The atrium was mildly dilated. - Tricuspid valve: There was moderate regurgitation. - Pulmonary arteries: PA peak pressure: 41 mm Hg (S). - Inferior vena cava: The vessel was dilated. The respirophasic diameter changes were blunted (< 50%), consistent with elevated central venous pressure.  Impressions:  - LVEF 60-65%, moderate LVH, aortic sclerosis with trivial AI, mild RAE, moderate MR and TR, RVSP 41 mmHg, dilated IVC.   Medications:   Scheduled Medications: . acetylcysteine  4 mL Nebulization TID  . antiseptic oral rinse  7 mL Mouth Rinse BID  . cefTRIAXone (ROCEPHIN)  IV  2 g Intravenous Q24H  . enoxaparin (LOVENOX) injection  40 mg Subcutaneous Daily  . febuxostat  80 mg Oral Daily  . feeding supplement (PRO-STAT SUGAR FREE 64)  30 mL Per Tube BID  . folic acid  1 mg Oral Daily  . folic acid  1 mg Intravenous Daily  . hydrALAZINE  10 mg Intravenous Q4H  .  insulin aspart  0-9 Units Subcutaneous Q4H  . metoprolol  10 mg Intravenous 4 times per day  . metoprolol  5 mg Intravenous Once  . multivitamin with minerals  1 tablet Oral Daily  . thiamine  100 mg Oral Daily   Or  . thiamine  100 mg Intravenous Daily     Infusions: . dextrose 5 % and 0.45% NaCl 1,000 mL with potassium chloride 40 mEq infusion 75 mL/hr at 05/11/14 2347  . diltiazem (CARDIZEM) infusion 5 mg/hr (05/12/14 0600)  . feeding supplement (GLUCERNA 1.2 CAL) Stopped (05/11/14 1830)     PRN Medications:  acetaminophen **OR** acetaminophen, albuterol, haloperidol lactate,  LORazepam, ondansetron **OR** ondansetron (ZOFRAN) IV   Assessment:   1. Persistent atrial fibrillation with intermittent RVR. CHADSVASC score 4. Currently on IV diltiazem (dose cut back last night) and Lovenox. Overnight IV Lopressor was held secondary to lower blood pressure.   2. Essential hypertension. Blood pressure trending down after initially being significantly elevated.  3. Comorbidities including encephalopathy, tiny left subdural hematoma and left MCA cortical infarct, sepsis with right iliopsoas abscess and Streptococcus bovis bacteremia, history of alcohol abuse.      Not pursuing full anticoagulation now with acute comorbidities. Address heart rate control with IV diltiazem (would titrate dose for goal around 100 BPM), may or may not require IV Lopressor additionally (it was held with lower blood pressure last night). With blood pressure trending down, would look to stop the Hydralazine first, and use diltiazem +/- Lopressor since also helping with heart rate control. Discussed with nursing.   Satira Sark, M.D., F.A.C.C.

## 2014-05-12 NOTE — Progress Notes (Signed)
Right:  40-59% ICA stenosis.  Bilateral:  Vertebral artery flow is antegrade.  Left:  ICA not adequately evaluated because the patient became agitated.

## 2014-05-13 DIAGNOSIS — G9340 Encephalopathy, unspecified: Secondary | ICD-10-CM

## 2014-05-13 LAB — BASIC METABOLIC PANEL
Anion gap: 7 (ref 5–15)
BUN: 15 mg/dL (ref 6–20)
CHLORIDE: 120 mmol/L — AB (ref 101–111)
CO2: 19 mmol/L — AB (ref 22–32)
Calcium: 8.8 mg/dL — ABNORMAL LOW (ref 8.9–10.3)
Creatinine, Ser: 0.73 mg/dL (ref 0.61–1.24)
GFR calc non Af Amer: >60 mL/min (ref >60)
Glucose, Bld: 159 mg/dL — ABNORMAL HIGH (ref 70–99)
Potassium: 4 mmol/L (ref 3.5–5.1)
Sodium: 146 mmol/L — ABNORMAL HIGH (ref 135–145)

## 2014-05-13 LAB — CBC
HCT: 38.6 % — ABNORMAL LOW (ref 39.0–52.0)
HEMOGLOBIN: 12.9 g/dL — AB (ref 13.0–17.0)
MCH: 32.7 pg (ref 26.0–34.0)
MCHC: 33.4 g/dL (ref 30.0–36.0)
MCV: 97.7 fL (ref 78.0–100.0)
PLATELETS: 369 10*3/uL (ref 150–400)
RBC: 3.95 MIL/uL — AB (ref 4.22–5.81)
RDW: 14.6 % (ref 11.5–15.5)
WBC: 13 10*3/uL — AB (ref 4.0–10.5)

## 2014-05-13 LAB — GLUCOSE, CAPILLARY
GLUCOSE-CAPILLARY: 150 mg/dL — AB (ref 70–99)
GLUCOSE-CAPILLARY: 164 mg/dL — AB (ref 70–99)
GLUCOSE-CAPILLARY: 146 mg/dL — AB (ref 70–99)
Glucose-Capillary: 153 mg/dL — ABNORMAL HIGH (ref 70–99)
Glucose-Capillary: 169 mg/dL — ABNORMAL HIGH (ref 70–99)

## 2014-05-13 MED ORDER — METOPROLOL TARTRATE 1 MG/ML IV SOLN
2.5000 mg | Freq: Four times a day (QID) | INTRAVENOUS | Status: DC | PRN
  Filled 2014-05-13: qty 5

## 2014-05-13 MED ORDER — LORAZEPAM 2 MG/ML IJ SOLN
0.5000 mg | INTRAMUSCULAR | Status: DC | PRN
Start: 2014-05-13 — End: 2014-05-14

## 2014-05-13 MED ORDER — LEVALBUTEROL HCL 0.63 MG/3ML IN NEBU
0.6300 mg | INHALATION_SOLUTION | RESPIRATORY_TRACT | Status: DC | PRN
Start: 2014-05-13 — End: 2014-05-15

## 2014-05-13 NOTE — Progress Notes (Signed)
Consulting cardiologist: Dr. Ena Dawley  Seen for followup: Atrial fibrillation  Subjective:    Awake to voice, but no clear verbalizations. Nursing reports somewhat more alert yesterday.  Objective:   Temp:  [97.1 F (36.2 C)-100.3 F (37.9 C)] 100.3 F (37.9 C) (05/08 0300) Pulse Rate:  [89-109] 93 (05/08 0700) Resp:  [19-39] 37 (05/08 0700) BP: (105-179)/(39-88) 169/72 mmHg (05/08 0700) SpO2:  [94 %-100 %] 94 % (05/08 0700) Last BM Date: 05/12/14  Filed Weights   05/04/14 2319 05/10/14 0400 05/12/14 0400  Weight: 223 lb 5.2 oz (101.3 kg) 222 lb 0.1 oz (100.7 kg) 228 lb 9.9 oz (103.7 kg)    Intake/Output Summary (Last 24 hours) at 05/13/14 0715 Last data filed at 05/13/14 0700  Gross per 24 hour  Intake   2130 ml  Output   1851 ml  Net    279 ml    Telemetry: Persistent atrial fibrillation, 90-100.  Exam:  General: Overweight male, no distress.  Lungs: Rhonchi, no wheezes.  Cardiac: Irregularly irregular.   Extremities: Mild edema.  Lab Results:  Basic Metabolic Panel:  Recent Labs Lab 05/10/14 0345 05/11/14 0340 05/12/14 0355 05/13/14 0408  NA 141 143 147* 146*  K 3.4* 3.4* 4.1 4.0  CL 111 117* 120* 120*  CO2 21* 19* 19* 19*  GLUCOSE 156* 167* 131* 159*  BUN 18 15 20 15   CREATININE 0.59* 0.62 0.75 0.73  CALCIUM 8.1* 8.2* 8.7* 8.8*  MG 1.9 1.7 2.2  --     Liver Function Tests:  Recent Labs Lab 05/12/14 0355  AST 50*  ALT 54  ALKPHOS 115  BILITOT 1.3*  PROT 6.6  ALBUMIN 3.0*    CBC:  Recent Labs Lab 05/11/14 0340 05/12/14 0355 05/13/14 0408  WBC 16.7* 18.3* 13.0*  HGB 13.7 13.5 12.9*  HCT 39.8 40.2 38.6*  MCV 97.1 97.8 97.7  PLT 357 397 369    Echocardiogram 05/06/2014: Study Conclusions  - Left ventricle: The cavity size was normal. Wall thickness was increased in a pattern of severe LVH. Systolic function was normal. The estimated ejection fraction was in the range of 60% to 65%. Wall motion was  normal; there were no regional wall motion abnormalities. The study is not technically sufficient to allow evaluation of LV diastolic function. - Aortic valve: Sclerosis without stenosis. There was trivial regurgitation. - Mitral valve: Mildly thickened leaflets . There was moderate regurgitation. - Left atrium: The atrium was at the upper limits of normal in size. - Right ventricle: The cavity size was moderately dilated. - Right atrium: The atrium was mildly dilated. - Tricuspid valve: There was moderate regurgitation. - Pulmonary arteries: PA peak pressure: 41 mm Hg (S). - Inferior vena cava: The vessel was dilated. The respirophasic diameter changes were blunted (< 50%), consistent with elevated central venous pressure.  Impressions:  - LVEF 60-65%, moderate LVH, aortic sclerosis with trivial AI, mild RAE, moderate MR and TR, RVSP 41 mmHg, dilated IVC.   Medications:   Scheduled Medications: . acetylcysteine  4 mL Nebulization TID  . antiseptic oral rinse  7 mL Mouth Rinse BID  . cefTRIAXone (ROCEPHIN)  IV  2 g Intravenous Q24H  . febuxostat  80 mg Oral Daily  . feeding supplement (PRO-STAT SUGAR FREE 64)  30 mL Per Tube BID  . folic acid  1 mg Oral Daily  . folic acid  1 mg Intravenous Daily  . hydrALAZINE  10 mg Intravenous Q4H  . insulin aspart  0-9  Units Subcutaneous Q4H  . metoprolol  10 mg Intravenous 4 times per day  . metoprolol  5 mg Intravenous Once  . multivitamin with minerals  1 tablet Oral Daily  . thiamine  100 mg Oral Daily   Or  . thiamine  100 mg Intravenous Daily    Infusions: . dextrose 5 % and 0.45% NaCl 1,000 mL with potassium chloride 40 mEq infusion 75 mL/hr at 05/13/14 0300  . diltiazem (CARDIZEM) infusion 10 mg/hr (05/13/14 0000)  . feeding supplement (GLUCERNA 1.2 CAL) Stopped (05/11/14 1830)    PRN Medications: acetaminophen **OR** acetaminophen, albuterol, haloperidol lactate, LORazepam, ondansetron **OR** ondansetron  (ZOFRAN) IV   Assessment:   1. Persistent atrial fibrillation with intermittent RVR. CHADSVASC score 4. Currently on IV diltiazem at 10 mg/h and Lovenox. Lopressor has not been given consistently due to blood pressure.  2. Essential hypertension. Trending up again. Still on Hydralazine.  3. Comorbidities including encephalopathy, tiny left subdural hematoma and left MCA cortical infarct, sepsis with right iliopsoas abscess and Streptococcus bovis bacteremia, history of alcohol abuse.      Not pursuing full anticoagulation now with acute comorbidities. Continue IV diltiazem and for now use Lopressor 2.5 mg Q6h PRN. Not yet able to take orals.   Satira Sark, M.D., F.A.C.C.

## 2014-05-13 NOTE — Progress Notes (Signed)
[facility] PROGRESS NOTE  [name] RKY:[id number] DOB: 1935/12/09 DOA: 05/04/2014 PCP: Lujean Amel, MD  Assessment/Plan: #1 sepsis Patient on presentation had a temperature 101.5  increased heart rate and new onset A. Fib in the presence of infection. Blood cultures with 1 out of 2 growing streptococcal bovis. Right iliopsoas has been aspirated and cultures are consistent with Streptococcus viridans. Repeat CT of the abdomen and pelvis with no significant change in iliopsoas abscess. Patient still with fevers. Continue empiric IV Rocephin. Patient has been seen by ID.  #2 acute encephalopathy/subdural hematoma/small CVA Multifactorial in nature secondary to acute infection with saws abscess, streptococcal bovis bacteremia, probable alcohol withdrawal, small subdural hematoma and small CVA noted on MRI of the head .patient not alert enough to do a swallow evaluation. Patient pulled out pending 2. ABG showed a metabolic alkalosis. B-12 levels within normal limits at 894. CT angiogram of the chest which was done was negative for PE. CT head was negative. MRI was consistent with a small CVA and tiny subdural hematoma. EEG was negative for seizure activity. RPR was nonreactive. HIV was nonreactive. Continue current IV antibiotics. Patient has had a 2-D echo, with no source of emboli. Carotid dopplers done with 40-59% R ICA stenosis and bilateral VA flow antegrade. L ICA not adequately visualized secondary to patient agitation. Unable to place on antiplatelet therapy secondary to subdural hematoma. Discontinued Lovenox. Follow. Neurology following and appreciate input and recommendations.   #3 streptococcal bovis bacteremia Likely seeded from the GI tract. Patient has been seen in consultation by infectious disease and recommended narrowing antibiotics. Concern also for colorectal malignancy. CT of the abdomen and pelvis pending. Patient will likely need a colonoscopy as outpatient. Patient  currently on IV Rocephin. ID following and appreciate input and recommendations.  #4 iliopsoas abscess with moderate viridans streptococcus Status post aspiration. Cultures with moderate viridans streptococcus. Continue empiric IV Rocephin. ID following. Follow.  #5 fever Likely secondary to problems #2 and 3. Concern for possible hip infection. CT abdomen and pelvis with no significant change to psoas abscess. Continue empiric IV antibiotics for now. ID following.  #6 New Onset atrial fibrillation Likely secondary to iliopsoas abscess and bacteremia.  TSH within normal limits. HR 90s this morning. 2-D echo with EF of 60-65% with moderate LVH, aortic sclerosis with trivial AI, mild RAE, moderate MR and TR, dilated IVC. Patient currently on a Cardizem drip. Once more alert may transition to oral long-acting Cardizem for rate control. Patient with a CHADSVASC score 4. Patient likely needs anticoagulation however due to history of chronic alcoholism question of compliance. Also due to recent small SDH and CVA, will hold off on anticoagulation at this time. Cardiology following and appreciate input and recommendations.  #7 hypertension Currently on Cardizem. When patient is more alert will transition to long-acting Cardizem.  #8 hypokalemia/hypomagnesemia Repleted.  #9 dm 2 Currently NPO secondary to altered MS. CBGs have ranged from 140-164. Sliding scale insulin.  #10 history of alcohol abuse Patient now minimally responsive. Moans to noxious stimuli. Opens eyes now to verbal stimuli. Ativan as needed for agitation. Appreciate critical care input.  #11 nutrition Patient removed Panda tube. If patient does not become alert in the next 24-48 hours may need to repeat Panda. SLP to reassess as patient a little more alert today.  #12 prophylaxis SCDs for DVT prophylaxis.  Code Status: full Family Communication: Updated daughter via phone [facility] [phone]) Disposition Plan: remain in the  step down unit.   Consultants:  Gen. Surgery: Dr. Lucia Gaskins 05/04/2014  Cardiology: Dr. Jacinta Shoe 05/05/2014  Orthopedics: Dr. Berenice Primas 05/08/2014  PCCM: Dr. Elsworth Soho 05/09/2014  Infectious disease: Dr. Linus Salmons 05/09/2014  Neurology: Dr. Aram Beecham 05/12/2014  Procedures:   CT pelvis 05/04/2014   plain films of the right hip and pelvis 05/04/2014   2-D echo 05/06/2014  Chest x-ray 05/08/2014, 05/06/2014, 05/04/2014  X-ray of the right knee 05/04/2014  CT aspiration 5 mL of bloody purulent material 05/07/2014  CT abdomen and pelvis 05/10/2014  Chest x-ray 05/11/2014  CT head 05/11/2014  CT angiogram chest 05/11/2014  MRI head 05/11/2014  Carotid doppler 05/12/14  Antibiotics:   IV vancomycin   05/04/2014>>>>> 05/11/2014  IV Zosyn  05/04/2014>>>> 05/09/2014   IV Rocephin 05/09/2014  HPI/Subjective:  Patient opens eyes to verbal stimuli. Some incoherent speech. Drifts back off to sleep.  Objective: Filed Vitals:   05/13/14 0800  BP:   Pulse:   Temp: 98.7 F (37.1 C)  Resp:     Intake/Output Summary (Last 24 hours) at 05/13/14 0922 Last data filed at 05/13/14 0700  Gross per 24 hour  Intake   2050 ml  Output   1851 ml  Net    199 ml   Filed Weights   05/04/14 2319 05/10/14 0400 05/12/14 0400  Weight: 101.3 kg (223 lb 5.2 oz) 100.7 kg (222 lb 0.1 oz) 103.7 kg (228 lb 9.9 oz)    Exam:   General:   Patient opens eyes to verbal stimuli. Groans.  Dry mucous membranes.  Cardiovascular:  Irregularly irregular.  Respiratory:  Some  coarse sounds in anterior lung fields.Inreased RR.  Abdomen:  Soft, nontender, nondistended, positive bowel sounds.  Musculoskeletal:  No clubbing cyanosis or edema.  Data Reviewed: Basic Metabolic Panel:  Recent Labs Lab 05/09/14 0400 05/09/14 0915 05/10/14 0345 05/11/14 0340 05/12/14 0355 05/13/14 0408  NA 140  --  141 143 147* 146*  K 3.1*  --  3.4* 3.4* 4.1 4.0  CL 109  --  111 117* 120* 120*  CO2 23  --  21*  19* 19* 19*  GLUCOSE 184*  --  156* 167* 131* 159*  BUN 17  --  18 15 20 15   CREATININE 0.66  --  0.59* 0.62 0.75 0.73  CALCIUM 7.8*  --  8.1* 8.2* 8.7* 8.8*  MG  --  1.3* 1.9 1.7 2.2  --   PHOS  --  3.3  --   --   --   --    Liver Function Tests:  Recent Labs Lab 05/12/14 0355  AST 50*  ALT 54  ALKPHOS 115  BILITOT 1.3*  PROT 6.6  ALBUMIN 3.0*   No results for input(s): LIPASE, AMYLASE in the last 168 hours.  Recent Labs Lab 05/09/14 0915  AMMONIA 29   CBC:  Recent Labs Lab 05/09/14 0400 05/10/14 0345 05/11/14 0340 05/12/14 0355 05/13/14 0408  WBC 18.3* 15.1* 16.7* 18.3* 13.0*  HGB 14.1 14.3 13.7 13.5 12.9*  HCT 42.0 42.1 39.8 40.2 38.6*  MCV 97.4 96.8 97.1 97.8 97.7  PLT 349 344 357 397 369   Cardiac Enzymes: No results for input(s): CKTOTAL, CKMB, CKMBINDEX, TROPONINI in the last 168 hours. BNP (last 3 results) No results for input(s): BNP in the last 8760 hours.  ProBNP (last 3 results) No results for input(s): PROBNP in the last 8760 hours.  CBG:  Recent Labs Lab 05/12/14 1202 05/12/14 1653 05/12/14 2054 05/13/14 0613 05/13/14 0749  GLUCAP 160* 186* 140* 150* 164*  Recent Results (from the past 240 hour(s))  Blood Culture (routine x 2)     Status: None   Collection Time: 05/04/14  3:00 PM  Result Value Ref Range Status   Specimen Description BLOOD LEFT WRIST  5 ML IN [facility] BOTTLE  Final   Special Requests NONE  Final   Culture   Final    NO GROWTH 5 DAYS Performed at [facility]    Report Status 05/10/2014 FINAL  Final  Blood Culture (routine x 2)     Status: None   Collection Time: 05/04/14  4:21 PM  Result Value Ref Range Status   Specimen Description BLOOD RIGHT ANTECUBITAL  Final   Special Requests BOTTLES DRAWN AEROBIC AND ANAEROBIC 5CC  Final   Culture   Final    STREPTOCOCCUS GROUP D;high probability for S.bovis Note: Gram Stain Report Called to,Read Back By and Verified With: CAROLYN OAKLEY 4.30.16 BY MANGR  503PM Performed at [facility]    Report Status 05/08/2014 FINAL  Final   Organism ID, Bacteria STREPTOCOCCUS GROUP D;high probability for S.bovis  Final      Susceptibility   Streptococcus group d;high probability for s.bovis - MIC (ETEST)*    PENICILLIN .064 SENSITIVE Sensitive     * STREPTOCOCCUS GROUP D;high probability for S.bovis  Urine culture     Status: None   Collection Time: 05/04/14  5:51 PM  Result Value Ref Range Status   Specimen Description URINE, CLEAN CATCH  Final   Special Requests NONE  Final   Colony Count   Final    7,000 COLONIES/ML Performed at [facility]    Culture   Final    INSIGNIFICANT GROWTH Performed at [facility]    Report Status 05/05/2014 FINAL  Final  MRSA PCR Screening     Status: None   Collection Time: 05/04/14 11:24 PM  Result Value Ref Range Status   MRSA by PCR NEGATIVE NEGATIVE Final    Comment:        The GeneXpert MRSA Assay (FDA approved for NASAL specimens only), is one component of a comprehensive MRSA colonization surveillance program. It is not intended to diagnose MRSA infection nor to guide or monitor treatment for MRSA infections.   Culture, routine-abscess     Status: None   Collection Time: 05/07/14  2:29 PM  Result Value Ref Range Status   Specimen Description ABSCESS  Final   Special Requests Normal  Final   Gram Stain   Final    ABUNDANT WBC PRESENT,BOTH PMN AND MONONUCLEAR NO SQUAMOUS EPITHELIAL CELLS SEEN NO ORGANISMS SEEN Performed at [facility]    Culture   Final    MODERATE VIRIDANS STREPTOCOCCUS Performed at [facility]    Report Status 05/11/2014 FINAL  Final  Culture, blood (routine x 2)     Status: None (Preliminary result)   Collection Time: 05/08/14  7:00 PM  Result Value Ref Range Status   Specimen Description BLOOD RIGHT HAND  Final   Special Requests BOTTLES DRAWN AEROBIC AND ANAEROBIC 10CC  Final   Culture   Final           BLOOD CULTURE  RECEIVED NO GROWTH TO DATE CULTURE WILL BE HELD FOR 5 DAYS BEFORE ISSUING A FINAL NEGATIVE REPORT Performed at [facility]    Report Status PENDING  Incomplete  Culture, blood (routine x 2)     Status: None (Preliminary result)   Collection Time: 05/08/14  7:15 PM  Result Value Ref Range Status   Specimen Description BLOOD RIGHT ARM  Final   Special Requests BOTTLES DRAWN AEROBIC AND ANAEROBIC 10CC  Final   Culture   Final           BLOOD CULTURE RECEIVED NO GROWTH TO DATE CULTURE WILL BE HELD FOR 5 DAYS BEFORE ISSUING A FINAL NEGATIVE REPORT Performed at [facility]    Report Status PENDING  Incomplete     Studies: Dg Abd 1 View  05/11/2014   CLINICAL DATA:  79 year old male with a history of nasogastric tube placement  EXAM: ABDOMEN - 1 VIEW  COMPARISON:  None.  FINDINGS: Gas within small bowel and colon. No abnormally distended small bowel loops.  Tip with surgical tubing projects within the mid mediastinum, below the level of the carina, potentially in the distal esophagus. No gastric tube within the left upper quadrant.  Calcifications of the aorta.  Formed stool within the rectum.  Surgical changes of right hip arthroplasty  IMPRESSION: Tip of gastric tube projects within the distal esophagus, below the level of the carina though not in the stomach.  Atherosclerosis.  Unremarkable bowel gas pattern with formed stool in the rectum, better characterized on recent prior CT.  Signed,  Dulcy Fanny. Earleen Newport, DO  [facility]  [facility]   Electronically Signed   By: Corrie Mckusick D.O.   On: 05/11/2014 12:32   Ct Head Wo Contrast  05/11/2014   CLINICAL DATA:  Encephalopathy, agitation  EXAM: CT HEAD WITHOUT CONTRAST  TECHNIQUE: Contiguous axial images were obtained from the base of the skull through the vertex without intravenous contrast.  COMPARISON:  None.  FINDINGS: Motion degraded images.  No evidence of parenchymal hemorrhage or  extra-axial fluid collection. No mass lesion, mass effect, or midline shift.  No CT evidence of acute infarction.  Subcortical white matter and periventricular small vessel ischemic changes.  Global cortical atrophy.  Secondary ventricular prominence.  The visualized paranasal sinuses are essentially clear. The mastoid air cells are unopacified. Nasoenteric tube.  No evidence of calvarial fracture.  IMPRESSION: Motion degraded images.  No evidence of acute intracranial abnormality.  Atrophy with small vessel ischemic changes.   Electronically Signed   By: Julian Hy M.D.   On: 05/11/2014 15:49   Ct Angio Chest Pe W/cm &/or Wo Cm  05/11/2014   CLINICAL DATA:  Tachycardia, fevers  EXAM: CT ANGIOGRAPHY CHEST WITH CONTRAST  TECHNIQUE: Multidetector CT imaging of the chest was performed using the standard protocol during bolus administration of intravenous contrast. Multiplanar CT image reconstructions and MIPs were obtained to evaluate the vascular anatomy.  CONTRAST:  146mL OMNIPAQUE IOHEXOL 350 MG/ML SOLN  COMPARISON:  Plain film from earlier in the same day.  FINDINGS: Images are significantly limited by patient motion artifact. The lungs show mild dependent atelectasis. No focal confluent infiltrate is seen. Small bilateral pleural effusions are noted. A nasogastric catheter is noted in place. The thoracic aorta demonstrates a normal branching pattern is well as significant calcific plaque. No dissection or aneurysmal dilatation is seen.  The pulmonary artery shows no large central pulmonary embolus. Evaluation of the smaller vessels is limited due to patient motion artifact. No significant hilar or mediastinal adenopathy is noted. Coronary calcifications are seen. Scanning into the upper abdomen demonstrates mild ascites. The osseous structures show no acute abnormality.  Review of the MIP images confirms the above findings.  IMPRESSION: Severely limited exam. Mild dependent atelectasis and small effusions  are seen.  Mild ascites is noted.  No definitive pulmonary emboli are seen.   Electronically Signed   By: Inez Catalina M.D.   On: 05/11/2014 15:50   Mr Brain Wo Contrast  05/11/2014   ADDENDUM REPORT: 05/11/2014 21:44  ADDENDUM: Study discussed by telephone with Provider TRogue Bussing on 05/11/2014 at 2140 hours.  In discussion of the findings, it occurs to me that the small cortical focus of diffusion abnormality along the left frontal lobe might alternatively reflect a small cerebral contusion (on the same side as the small left subdural hematoma).  I inquired about the possibility of recent fall, but Provider Rogue Bussing was not aware of and did not immediately find documentation of any recent trauma.   Electronically Signed   By: Genevie Ann M.D.   On: 05/11/2014 21:44   05/11/2014   CLINICAL DATA:  79 year old male with increased agitation and confusion. Acute encephalopathy. Tachycardia and fever. Right iliopsoas abscess. Initial encounter.  EXAM: MRI HEAD WITHOUT CONTRAST  TECHNIQUE: Multiplanar, multiecho pulse sequences of the brain and surrounding structures were obtained without intravenous contrast.  COMPARISON:  Head CT without contrast 1525 hours today.  FINDINGS: Study is intermittently degraded by motion artifact despite repeated imaging attempts.  Cerebral volume is within normal limits for age. No ventriculomegaly. No midline shift or significant intracranial mass effect.  Subtle left subdural hematoma is identified (series 7, images 15 and 16). This is up to 2 mm in thickness. There is mild associated diffusion artifact (series 402, image 17). However, there is a smaller more intense focus of increased trace diffusion signal along the left frontal operculum on series 402, image 20. This is also seen on coronal diffusion (series 5, image 27). No associated T2 or FLAIR hyperintensity.  No other diffusion abnormality. Major intracranial vascular flow voids are within normal limits.  No other intracranial  hemorrhage is identified. Patchy nonspecific periventricular white matter T2 and FLAIR hyperintensity corresponds hypodensity on the recent CT. No temporal lobe edema or signal abnormality is identified. Deep gray matter nuclei, brainstem and cerebellum are within normal limits for age. Negative pituitary, cervicomedullary junction and visualized cervical spine.  Visible internal auditory structures appear normal. Mastoids are clear. Trace paranasal sinus mucosal thickening. Postoperative changes to the right globe. Visualized scalp soft tissues are within normal limits. Visualized bone marrow signal is within normal limits.  IMPRESSION: 1. Tiny left subdural hematoma, up to 2 mm in thickness. No associated mass effect. This is currently occult by CT. 2. Evidence of a tiny cortically based infarct in the low anterior left MCA territory. No mass effect or associated hemorrhage. 3. Mild to moderate for age nonspecific cerebral white matter signal changes  Electronically Signed: By: Genevie Ann M.D. On: 05/11/2014 21:31   Dg Chest Port 1 View  05/11/2014   CLINICAL DATA:  Altered mental status, history of hypertension, atrial fibrillation, diabetes.  EXAM: PORTABLE CHEST - 1 VIEW  COMPARISON:  Portable chest x-ray of May 08, 2014  FINDINGS: The lungs are reasonably well inflated and exhibit coarse interstitial markings bilaterally especially in the right infrahilar region. There is no pleural effusion. The cardiac silhouette is mildly enlarged. The mediastinum is normal in width. There is dense calcification in the wall of the ascending and proximal descending thoracic aorta.  IMPRESSION: Subsegmental atelectasis or early interstitial infiltrate in the right infrahilar region. This could be related to aspiration. There is no pulmonary edema.   Electronically Signed   By: David  Martinique M.D.  On: 05/11/2014 09:40    Scheduled Meds: . acetylcysteine  4 mL Nebulization TID  . antiseptic oral rinse  7 mL Mouth Rinse BID   . cefTRIAXone (ROCEPHIN)  IV  2 g Intravenous Q24H  . febuxostat  80 mg Oral Daily  . feeding supplement (PRO-STAT SUGAR FREE 64)  30 mL Per Tube BID  . folic acid  1 mg Oral Daily  . folic acid  1 mg Intravenous Daily  . hydrALAZINE  10 mg Intravenous Q4H  . insulin aspart  0-9 Units Subcutaneous Q4H  . multivitamin with minerals  1 tablet Oral Daily  . thiamine  100 mg Oral Daily   Or  . thiamine  100 mg Intravenous Daily   Continuous Infusions: . dextrose 5 % and 0.45% NaCl 1,000 mL with potassium chloride 40 mEq infusion 75 mL/hr at 05/13/14 0300  . diltiazem (CARDIZEM) infusion 10 mg/hr (05/13/14 0000)  . feeding supplement (GLUCERNA 1.2 CAL) Stopped (05/11/14 1830)    Principal Problem:   Sepsis Active Problems:   Streptococcal bacteremia   Acute encephalopathy   Fever   Psoas abscess   New onset atrial fibrillation   Diabetes mellitus type 2, controlled   Essential hypertension   Gout   Psoas abscess, right   Iliopsoas abscess on right   Atrial fibrillation with rapid ventricular response   Alcoholism   Type 2 diabetes mellitus without complication   Pyrexia   Abscess of male pelvis   Right hip pain   Cerebral infarction due to embolism of left middle cerebral artery   CVA (cerebral infarction)   SDH (subdural hematoma)    Time spent: 19 mins    [facility]  MD [facility] Pager [phone]. If 7PM-7AM, please contact night-coverage at [email or web address], password [facility] 05/13/2014, 9:22 AM  LOS: 9 days            5

## 2014-05-13 NOTE — Progress Notes (Signed)
ANTIBIOTIC CONSULT NOTE - FOLLOW UP  Pharmacy Consult for Ceftriaxone Indication: psoas abscess  Allergies  Allergen Reactions  . Allopurinol Other (See Comments)    Makes head 'feel funny'  . Motrin [Ibuprofen] Other (See Comments)    Blisters in groin area    Patient Measurements: Height: 5\' 7"  (170.2 cm) Weight: 228 lb 9.9 oz (103.7 kg) IBW/kg (Calculated) : 66.1  Vital Signs: Temp: 98.7 F (37.1 C) (05/08 0800) Temp Source: Oral (05/08 0800) BP: 126/61 mmHg (05/08 1000) Pulse Rate: 101 (05/08 1000) Intake/Output from previous day: 05/07 0701 - 05/08 0700 In: 2130 [I.V.:2080; IV Piggyback:50] Out: 4496 [Urine:1850; Stool:1] Intake/Output from this shift: Total I/O In: 255 [I.V.:255] Out: -   Labs:  Recent Labs  05/11/14 0340 05/12/14 0355 05/13/14 0408  WBC 16.7* 18.3* 13.0*  HGB 13.7 13.5 12.9*  PLT 357 397 369  CREATININE 0.62 0.75 0.73   Estimated Creatinine Clearance: 88.7 mL/min (by C-G formula based on Cr of 0.73).  Recent Labs  05/10/14 1535  VANCOTROUGH 15     Assessment: 79 y.o M presents to the ED with c/o fatigue and "dark urine" for the past couple of days. LA elevated at 2.38.  Pharmacy was initially consulted to dose broad abx with vancomycin and zosyn for suspected sepsis, concern for psoas abscess.  Now, narrowed by ID to Vancomycin and Ceftriaxone as Pseudomonas is unlikely.  4/29 >> Zosyn >> 5/4 4/29 >> Vanc >> 5/6 5/4 >> Ceftriaxone >>  4/29 MRSA PCR: neg 4/29 blood x2: 1 of 2 Group D strep, high prob S. Bovis - sens to pcn 4/29 urine: insig growth-final 5/2 abscess: moderate Strep viridans - no sens 5/3 blood x 2: NGTD  Today, 05/13/2014: Afebrile, WBCs remain elevated but improved today, SCr wnl.  Goal of Therapy:  Appropriate abx dosing, eradication of infection.   Plan:  Continue Ceftriaxone 2g IV q24h Dose-adjustments not likely to be necessary so pharmacy will sign-off.   Romeo Rabon, PharmD, pager [phone].  05/13/2014,11:46 AM.

## 2014-05-14 DIAGNOSIS — I6200 Nontraumatic subdural hemorrhage, unspecified: Secondary | ICD-10-CM

## 2014-05-14 DIAGNOSIS — I639 Cerebral infarction, unspecified: Secondary | ICD-10-CM | POA: Insufficient documentation

## 2014-05-14 DIAGNOSIS — R5081 Fever presenting with conditions classified elsewhere: Secondary | ICD-10-CM

## 2014-05-14 LAB — FOLATE RBC
FOLATE, HEMOLYSATE: 501.8 ng/mL
FOLATE, RBC: 1274 ng/mL (ref >498)
Hematocrit: 39.4 % (ref 37.5–51.0)

## 2014-05-14 LAB — BASIC METABOLIC PANEL
ANION GAP: 10 (ref 5–15)
BUN: 14 mg/dL (ref 6–20)
CO2: 19 mmol/L — ABNORMAL LOW (ref 22–32)
CREATININE: 0.77 mg/dL (ref 0.61–1.24)
Calcium: 9.1 mg/dL (ref 8.9–10.3)
Chloride: 115 mmol/L — ABNORMAL HIGH (ref 101–111)
GFR calc non Af Amer: >60 mL/min (ref >60)
Glucose, Bld: 139 mg/dL — ABNORMAL HIGH (ref 70–99)
POTASSIUM: 4.4 mmol/L (ref 3.5–5.1)
Sodium: 144 mmol/L (ref 135–145)

## 2014-05-14 LAB — MAGNESIUM: MAGNESIUM: 1.7 mg/dL (ref 1.7–2.4)

## 2014-05-14 LAB — GLUCOSE, CAPILLARY
GLUCOSE-CAPILLARY: 170 mg/dL — AB (ref 70–99)
Glucose-Capillary: 122 mg/dL — ABNORMAL HIGH (ref 70–99)
Glucose-Capillary: 141 mg/dL — ABNORMAL HIGH (ref 70–99)
Glucose-Capillary: 142 mg/dL — ABNORMAL HIGH (ref 70–99)
Glucose-Capillary: 143 mg/dL — ABNORMAL HIGH (ref 70–99)
Glucose-Capillary: 145 mg/dL — ABNORMAL HIGH (ref 70–99)

## 2014-05-14 LAB — VOLATILES,BLD-ACETONE,ETHANOL,ISOPROP,METHANOL
Acetone, blood: NEGATIVE % (ref 0.000–0.010)
ETHANOL, BLOOD: NEGATIVE % (ref 0.000–0.010)
ISOPROPANOL, BLOOD: NEGATIVE % (ref 0.000–0.010)
Methanol, blood: NEGATIVE % (ref 0.000–0.010)

## 2014-05-14 LAB — CBC
HCT: 40.5 % (ref 39.0–52.0)
Hemoglobin: 13.5 g/dL (ref 13.0–17.0)
MCH: 32.8 pg (ref 26.0–34.0)
MCHC: 33.3 g/dL (ref 30.0–36.0)
MCV: 98.5 fL (ref 78.0–100.0)
PLATELETS: 377 10*3/uL (ref 150–400)
RBC: 4.11 MIL/uL — ABNORMAL LOW (ref 4.22–5.81)
RDW: 14.8 % (ref 11.5–15.5)
WBC: 14.1 10*3/uL — AB (ref 4.0–10.5)

## 2014-05-14 MED ORDER — LORAZEPAM 2 MG/ML IJ SOLN
0.2500 mg | INTRAMUSCULAR | Status: DC | PRN
  Administered 2014-05-18 (×2): 0.25 mg via INTRAVENOUS
  Filled 2014-05-14 (×2): qty 1

## 2014-05-14 MED ORDER — LEVALBUTEROL HCL 0.63 MG/3ML IN NEBU
0.6300 mg | INHALATION_SOLUTION | Freq: Three times a day (TID) | RESPIRATORY_TRACT | Status: DC
  Administered 2014-05-14 – 2014-05-15 (×4): 0.63 mg via RESPIRATORY_TRACT
  Filled 2014-05-14 (×4): qty 3

## 2014-05-14 MED ORDER — ENSURE ENLIVE PO LIQD
237.0000 mL | Freq: Two times a day (BID) | ORAL | Status: DC
  Administered 2014-05-14 – 2014-05-21 (×6): 237 mL via ORAL

## 2014-05-14 MED ORDER — MAGNESIUM SULFATE 50 % IJ SOLN
3.0000 g | Freq: Once | INTRAVENOUS | Status: AC
  Administered 2014-05-14: 3 g via INTRAVENOUS
  Filled 2014-05-14: qty 6

## 2014-05-14 NOTE — Progress Notes (Signed)
Subjective: Confused.  Alert.  Objective: Vital signs in last 24 hours: Temp:  [98.3 F (36.8 C)-100.5 F (38.1 C)] 98.8 F (37.1 C) (05/09 0821) Pulse Rate:  [76-110] 106 (05/09 0600) Resp:  [12-40] 27 (05/09 0600) BP: (126-171)/(52-120) 161/69 mmHg (05/09 0600) SpO2:  [92 %-97 %] 96 % (05/09 0807) Weight:  [228 lb 6.3 oz (103.6 kg)] 228 lb 6.3 oz (103.6 kg) (05/09 0400) Last BM Date: 05/12/14  Intake/Output from previous day: 05/08 0701 - 05/09 0700 In: 2090 [I.V.:2040; IV Piggyback:50] Out: 810 [Urine:810] Intake/Output this shift:    Medications Scheduled Meds: . antiseptic oral rinse  7 mL Mouth Rinse BID  . cefTRIAXone (ROCEPHIN)  IV  2 g Intravenous Q24H  . febuxostat  80 mg Oral Daily  . feeding supplement (PRO-STAT SUGAR FREE 64)  30 mL Per Tube BID  . folic acid  1 mg Oral Daily  . folic acid  1 mg Intravenous Daily  . hydrALAZINE  10 mg Intravenous Q4H  . insulin aspart  0-9 Units Subcutaneous Q4H  . levalbuterol  0.63 mg Nebulization TID  . magnesium sulfate 1 - 4 g bolus IVPB  3 g Intravenous Once  . multivitamin with minerals  1 tablet Oral Daily  . thiamine  100 mg Oral Daily   Or  . thiamine  100 mg Intravenous Daily   Continuous Infusions: . dextrose 5 % and 0.45% NaCl 1,000 mL with potassium chloride 40 mEq infusion 75 mL/hr at 05/14/14 0552  . diltiazem (CARDIZEM) infusion 10 mg/hr (05/14/14 0700)  . feeding supplement (GLUCERNA 1.2 CAL) Stopped (05/11/14 1830)   PRN Meds:.acetaminophen **OR** acetaminophen, haloperidol lactate, levalbuterol, LORazepam, metoprolol, ondansetron **OR** ondansetron (ZOFRAN) IV  PE: General appearance: alert, cooperative and no distress Lungs: clear to auscultation bilaterally Heart: irregularly irregular rhythm and No MRG Abdomen: +BS.  Nontender.  Distended.   Extremities: Trace LEE Pulses: 2+ and symmetric Skin: Warm and dry Neurologic: Grossly normal  Lab Results:   Recent Labs  05/12/14 0355  05/13/14 0408 05/14/14 0400  WBC 18.3* 13.0* 14.1*  HGB 13.5 12.9* 13.5  HCT 40.2 38.6* 40.5  PLT 397 369 377   BMET  Recent Labs  05/12/14 0355 05/13/14 0408 05/14/14 0400  NA 147* 146* 144  K 4.1 4.0 4.4  CL 120* 120* 115*  CO2 19* 19* 19*  GLUCOSE 131* 159* 139*  BUN 20 15 14   CREATININE 0.75 0.73 0.77  CALCIUM 8.7* 8.8* 9.1    Assessment/Plan  Principal Problem:   Sepsis Active Problems:   Fever   Psoas abscess   New onset atrial fibrillation   Diabetes mellitus type 2, controlled   Essential hypertension   Gout   Psoas abscess, right   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 Persistent atrial fibrillation with intermittent RVR.  Rate controlled.  CHADSVASC score 4. Currently on IV diltiazem at 10 mg/h. PRN IV Lopressor not been given.  EF 60-65%, normal wall motion, moderate MR, RV moderate dilation, moderate TR, PA pressure 47mmHg.  Not an anticoagulation candidate.  2. Essential hypertension.  On IV Hydralazine Q4Hr.  3. Comorbidities including encephalopathy, tiny left subdural hematoma and left MCA cortical infarct, sepsis with right iliopsoas abscess and Streptococcus bovis bacteremia, history of alcohol abuse.   LOS: 10 days    HAGER, BRYAN PA-C 05/14/2014 9:38 AM   Attending Note:   The patient was seen and examined.  Agree with assessment and plan as noted above.  Changes made to the above note as needed.  Pt is in persistent atrial fib.  Continue IV cardizem.  Change to po once he is taking PO.  Would start with Diltiazem 240 CD and  titrate as needed.  He is a poor candidate for anticoagulation due to his extensive ETOH hx.   LV function is normal   Will sign off.  Call for questions    Ramond Dial., MD, [facility] 05/14/2014, 10:57 AM [address]  [place] Pager [phone]

## 2014-05-14 NOTE — Progress Notes (Signed)
[facility] for Infectious Disease  Date of Admission:  05/04/2014  Antibiotics: ceftriaxone  Subjective: No acute events  Objective: Temp:  [98.2 F (36.8 C)-100.5 F (38.1 C)] 98.2 F (36.8 C) (05/09 1223) Pulse Rate:  [85-110] 94 (05/09 1400) Resp:  [12-37] 37 (05/09 1400) BP: (126-171)/(61-120) 152/80 mmHg (05/09 1400) SpO2:  [92 %-97 %] 96 % (05/09 1400) Weight:  [228 lb 6.3 oz (103.6 kg)] 228 lb 6.3 oz (103.6 kg) (05/09 0400)  General: currently on facemask, minimally arousable Lungs: CTA B Cor: irr irr   Lab Results Lab Results  Component Value Date   WBC 14.1* 05/14/2014   HGB 13.5 05/14/2014   HCT 40.5 05/14/2014   MCV 98.5 05/14/2014   PLT 377 05/14/2014    Lab Results  Component Value Date   CREATININE 0.77 05/14/2014   BUN 14 05/14/2014   NA 144 05/14/2014   K 4.4 05/14/2014   CL 115* 05/14/2014   CO2 19* 05/14/2014    Lab Results  Component Value Date   ALT 54 05/12/2014   AST 50* 05/12/2014   ALKPHOS 115 05/12/2014   BILITOT 1.3* 05/12/2014      Microbiology: Recent Results (from the past 240 hour(s))  Blood Culture (routine x 2)     Status: None   Collection Time: 05/04/14  3:00 PM  Result Value Ref Range Status   Specimen Description BLOOD LEFT WRIST  5 ML IN [facility] BOTTLE  Final   Special Requests NONE  Final   Culture   Final    NO GROWTH 5 DAYS Performed at [facility]    Report Status 05/10/2014 FINAL  Final  Blood Culture (routine x 2)     Status: None   Collection Time: 05/04/14  4:21 PM  Result Value Ref Range Status   Specimen Description BLOOD RIGHT ANTECUBITAL  Final   Special Requests BOTTLES DRAWN AEROBIC AND ANAEROBIC 5CC  Final   Culture   Final    STREPTOCOCCUS GROUP D;high probability for S.bovis Note: Gram Stain Report Called to,Read Back By and Verified With: CAROLYN OAKLEY 4.30.16 BY MANGR 503PM Performed at [facility]    Report Status 05/08/2014 FINAL  Final   Organism ID, Bacteria  STREPTOCOCCUS GROUP D;high probability for S.bovis  Final      Susceptibility   Streptococcus group d;high probability for s.bovis - MIC (ETEST)*    PENICILLIN .064 SENSITIVE Sensitive     * STREPTOCOCCUS GROUP D;high probability for S.bovis  Urine culture     Status: None   Collection Time: 05/04/14  5:51 PM  Result Value Ref Range Status   Specimen Description URINE, CLEAN CATCH  Final   Special Requests NONE  Final   Colony Count   Final    7,000 COLONIES/ML Performed at [facility]    Culture   Final    INSIGNIFICANT GROWTH Performed at [facility]    Report Status 05/05/2014 FINAL  Final  MRSA PCR Screening     Status: None   Collection Time: 05/04/14 11:24 PM  Result Value Ref Range Status   MRSA by PCR NEGATIVE NEGATIVE Final    Comment:        The GeneXpert MRSA Assay (FDA approved for NASAL specimens only), is one component of a comprehensive MRSA colonization surveillance program. It is not intended to diagnose MRSA infection nor to guide or monitor treatment for MRSA infections.   Culture, routine-abscess     Status: None  Collection Time: 05/07/14  2:29 PM  Result Value Ref Range Status   Specimen Description ABSCESS  Final   Special Requests Normal  Final   Gram Stain   Final    ABUNDANT WBC PRESENT,BOTH PMN AND MONONUCLEAR NO SQUAMOUS EPITHELIAL CELLS SEEN NO ORGANISMS SEEN Performed at [facility]    Culture   Final    MODERATE VIRIDANS STREPTOCOCCUS Performed at [facility]    Report Status 05/11/2014 FINAL  Final  Culture, blood (routine x 2)     Status: None (Preliminary result)   Collection Time: 05/08/14  7:00 PM  Result Value Ref Range Status   Specimen Description BLOOD RIGHT HAND  Final   Special Requests BOTTLES DRAWN AEROBIC AND ANAEROBIC 10CC  Final   Culture   Final           BLOOD CULTURE RECEIVED NO GROWTH TO DATE CULTURE WILL BE HELD FOR 5 DAYS BEFORE ISSUING A FINAL NEGATIVE REPORT Performed  at [facility]    Report Status PENDING  Incomplete  Culture, blood (routine x 2)     Status: None (Preliminary result)   Collection Time: 05/08/14  7:15 PM  Result Value Ref Range Status   Specimen Description BLOOD RIGHT ARM  Final   Special Requests BOTTLES DRAWN AEROBIC AND ANAEROBIC 10CC  Final   Culture   Final           BLOOD CULTURE RECEIVED NO GROWTH TO DATE CULTURE WILL BE HELD FOR 5 DAYS BEFORE ISSUING A FINAL NEGATIVE REPORT Performed at [facility]    Report Status PENDING  Incomplete    Studies/Results: No results found.  Assessment/Plan:  1) Psoas abscess - Strep in BCx, Strep viridans in abscess.  Continue with ceftiraxone. -psoas abscess collection still noted without much change.  Will continue with antibiotics  2) encephalopathy - multifactorial.    3) fever - secondary to #1 and also with bleed.       Scharlene Gloss, [facility] Infect[facility] [place] [email or web address] [phone] pager   [phone] cell 05/14/2014, 2:43 PM

## 2014-05-14 NOTE — Evaluation (Signed)
Clinical/Bedside Swallow Evaluation Patient Details  Name: [name] MRN: [id number] Date of Birth: February 28, 1935  Today's Date: 05/14/2014 Time: SLP Start Time (ACUTE ONLY): 0859 SLP Stop Time (ACUTE ONLY): 0920 SLP Time Calculation (min) (ACUTE ONLY): 21 min  Past Medical History:  Past Medical History  Diagnosis Date  . Hypertension   . Arthritis    Past Surgical History:  Past Surgical History  Procedure Laterality Date  . Hip arthroplasty Right   . Joint replacement      right hip, bilateral knees  . Right cataract extraction     HPI:  79 yo male adm to [facility] with AMS, found to be septic - pt hospital course complicated by his lethargy.  Found to have a small SDH.  PMH + for DM, ETOH abuse.  Swallow evaluation ordered.  CT chest showed mild ATX and mild effusion.     Assessment / Plan / Recommendation Clinical Impression  Largest barrier to adequate nutrition at this time appears to be pt's mental status/lethargy.  No focal CN deficits noted - however pt did not consistently follow directions and leaned repeatedly to the left despite repositioning.    Pt repeatedly asked for water - voice noted to be weak and mildly hoarse. -Delayed oral transiting noted across consistencies tested.  Possibly delayed pharyngeal swallow initiation but no s/s of aspiraton.  Belching noted after all swallows of liquids but did not elicit cough and pt did not complain of backflow.   Recommend initiate modifed diet currently with STRICT aspiraiton precautions = feeding pt only when fully alert and accepting.  Will follow for readiness for dietary advancement.    SLP educated pt to recommendations but uncertain to understanding.      Aspiration Risk  Moderate    Diet Recommendation Dysphagia 1 (Puree);Thin   Medication Administration: Whole meds with puree Compensations: Small sips/bites;Slow rate;Check for pocketing    Other  Recommendations   n/a  Follow Up Recommendations    n/a    Frequency and Duration    1 week   Pertinent Vitals/Pain Febrile, decreased     Swallow Study Prior Functional Status   see [place] Date of Onset: 05/14/14 Other Pertinent Information: 79 yo male adm to [facility] with AMS, found to be septic - pt hospital course complicated by his lethargy.  Found to have a small SDH.  PMH + for DM, ETOH abuse.  Swallow evaluation ordered.  CT chest showed mild ATX and mild effusion.   Type of Study: Bedside swallow evaluation Diet Prior to this Study: NPO Temperature Spikes Noted: Yes Respiratory Status: Room air History of Recent Intubation: No Behavior/Cognition: Lethargic/Drowsy;Uncooperative Oral Cavity - Dentition: Other (Comment) (2 lower teeth,  upper denture in room - SlP did not place due to pt's mental status and dysarthria) Self-Feeding Abilities: Total assist Patient Positioning: Upright in bed Baseline Vocal Quality: Low vocal intensity;Hoarse Volitional Cough: Weak Volitional Swallow: Unable to elicit    Oral/Motor/Sensory Function Overall Oral Motor/Sensory Function:  (pt did not follow commands = generalized weakness)   Ice Chips Ice chips: Impaired Presentation: Spoon Oral Phase Impairments: Reduced lingual movement/coordination;Impaired anterior to posterior transit Oral Phase Functional Implications: Prolonged oral transit Pharyngeal Phase Impairments: Suspected delayed Swallow   Thin Liquid Thin Liquid: Impaired Presentation: Straw;Spoon Oral Phase Impairments: Reduced lingual movement/coordination;Impaired anterior to posterior transit Oral Phase Functional Implications: Prolonged oral transit Pharyngeal  Phase Impairments: Suspected delayed Swallow    Nectar Thick Nectar Thick Liquid: Not tested  Honey Thick Honey Thick Liquid: Not tested   Puree Puree: Impaired Presentation: Spoon Oral Phase Impairments: Reduced lingual movement/coordination;Impaired anterior to posterior transit Oral Phase Functional  Implications: Prolonged oral transit Pharyngeal Phase Impairments: Suspected delayed Swallow   Solid   GO    Solid: Not tested       Claudie Fisherman, [facility] SLP [phone]

## 2014-05-14 NOTE — Progress Notes (Signed)
Nutrition Follow-up  DOCUMENTATION CODES:  Obesity unspecified  INTERVENTION:  Provide Ensure Enlive po BID, each supplement provides 350 kcal and 20 grams of protein  If panda tube replaced: Initiate Glucerna 1.2 @ 20 ml/hr via Panda tube and increase by 10 ml every 8 hours to goal rate of 70 ml/hr.  30 ml Prostat BID.   Tube feeding regimen provides 2216 kcal (100% of needs), 123 grams of protein, and 1361 ml of H2O.   RD will continue to monitor  NUTRITION DIAGNOSIS:  Inadequate oral intake related to inability to eat as evidenced by NPO status.  ongoing  GOAL:  Patient will meet greater than or equal to 90% of their needs  Not met  MONITOR:  PO intake, Diet advancement, Labs  REASON FOR ASSESSMENT:  Consult Enteral/tube feeding initiation and management  ASSESSMENT: 79 y.o. male with history of diabetes mellitus type 2, chronic alcoholism, hypertension, gout presents to the ER because of worsening pain in the right hip and right lower quadrant.  5/3: - Pt s/p CT guided aspiration of left psoas hematoma 5/2 - Ongoing encephalopathy, pt currently NPO. Nutritional history obtained from RN and pt medical chart.  - No weight history.   5/6: - Consult received for TF initiation and management. Panda tube placed 5/6. - RD to order TF. Pt with history of DM.  - Will monitor for TF tolerance. - Pt is at risk for re-feeding syndrome given that he has been NPO for 8 days and has a history of ETOH abuse.    5/9: -RD consulted for nutritional assessment.  -Pt pulled out panda tube 5/7.  -SLP evaluation showed no s/s of aspiration. States that his mental status is his biggest barrier to PO intake. Recommend Dysphagia 1 diet with thin liquids. -Unable to obtain history from pt d/t confusion. -PT would benefit from nutritional supplementation d/t malnutrition status and ETOH abuse history.  Labs and medications reviewed.  Height:  Ht Readings from Last 1  Encounters:  05/04/14 5' 7"  (1.702 m)    Weight:  Wt Readings from Last 1 Encounters:  05/14/14 228 lb 6.3 oz (103.6 kg)    Ideal Body Weight:  61.4 kg  Wt Readings from Last 10 Encounters:  05/14/14 228 lb 6.3 oz (103.6 kg)  05/11/14 222 lb (100.699 kg)    BMI:  Body mass index is 35.76 kg/(m^2).  Estimated Nutritional Needs:  Kcal:  2100-2300  Protein:  120-130 g  Fluid:  2.1 L/day    Skin:  Reviewed, no issues  Diet Order:  DIET - DYS 1 Room service appropriate?: No; Fluid consistency:: Thin  EDUCATION NEEDS:  Education needs no appropriate at this time   Intake/Output Summary (Last 24 hours) at 05/14/14 1147 Last data filed at 05/14/14 0945  Gross per 24 hour  Intake   2001 ml  Output   1035 ml  Net    966 ml    Last BM:  5/7  Clayton Bibles, MS, RD, LDN Pager: [phone] After Hours Pager: [phone]

## 2014-05-14 NOTE — Progress Notes (Signed)
[facility] PROGRESS NOTE  [name]:[id number] DOB: [birth year] DOA: 05/04/2014 PCP: Lujean Amel, MD  Assessment/Plan: #1 sepsis Patient on presentation had a temperature 101.5  increased heart rate and new onset A. Fib in the presence of infection. Blood cultures with 1 out of 2 growing streptococcal bovis. Right iliopsoas has been aspirated and cultures are consistent with Streptococcus viridans. Repeat CT of the abdomen and pelvis with no significant change in iliopsoas abscess. Patient still with fevers. Continue empiric IV Rocephin. Patient has been seen by ID.  #2 acute encephalopathy/subdural hematoma/small CVA Multifactorial in nature secondary to acute infection with saws abscess, streptococcal bovis bacteremia, probable alcohol withdrawal, small subdural hematoma and small CVA noted on MRI of the head . Patient more today alert to do a swallow evaluation. Patient pulled out pending 2. ABG showed a metabolic alkalosis. B-12 levels within normal limits at 894. CT angiogram of the chest which was done was negative for PE. CT head was negative. MRI was consistent with a small CVA and tiny subdural hematoma. EEG was negative for seizure activity. RPR was nonreactive. HIV was nonreactive. Continue current IV antibiotics. Patient has had a 2-D echo, with no source of emboli. Carotid dopplers done with 40-59% R ICA stenosis and bilateral VA flow antegrade. L ICA not adequately visualized secondary to patient agitation. Unable to place on antiplatelet therapy secondary to subdural hematoma. Discontinued Lovenox. Follow. Neurology following and appreciate input and recommendations.   #3 streptococcal bovis bacteremia Likely seeded from the GI tract. Patient has been seen in consultation by infectious disease and recommended narrowing antibiotics. Concern also for colorectal malignancy. CT of the abdomen and pelvis pending. Patient will likely need a colonoscopy as outpatient. Patient  currently on IV Rocephin. ID following and appreciate input and recommendations.  #4 iliopsoas abscess with moderate viridans streptococcus Status post aspiration. Cultures with moderate viridans streptococcus. Continue empiric IV Rocephin. ID following. Follow.  #5 fever Likely secondary to problems #2 and 3. Concern for possible hip infection. CT abdomen and pelvis with no significant change to psoas abscess. Continue empiric IV antibiotics for now. ID following.  #6 New Onset atrial fibrillation Likely secondary to iliopsoas abscess and bacteremia.  TSH within normal limits. HR 90s this morning. 2-D echo with EF of 60-65% with moderate LVH, aortic sclerosis with trivial AI, mild RAE, moderate MR and TR, dilated IVC. Patient currently on a Cardizem drip. Once more alert may transition to oral long-acting Cardizem for rate control. Patient with a CHADSVASC score 4. Patient likely needs anticoagulation however due to history of chronic alcoholism question of compliance. Also due to recent small SDH and CVA, will hold off on anticoagulation at this time. Cardiology following and appreciate input and recommendations.  #7 hypertension Currently on Cardizem. When patient is more alert will transition to long-acting Cardizem.  #8 hypokalemia/hypomagnesemia Repleted.  #9 dm 2 Currently NPO secondary to altered MS. CBGs have ranged from 140-164. Sliding scale insulin.  #10 history of alcohol abuse Patient alert last night and talking and this morning per nursing. Moans to noxious stimuli. Opens eyes now to verbal stimuli. Decrease Ativan as needed for agitation. Appreciate critical care input.  #11 nutrition Patient removed Panda tube. Patient was alert last night and this morning per nursing. SLP to reassess for swallow evaluation. If fails swallow evaluation will place panda tube.  #12 prophylaxis SCDs for DVT prophylaxis.  Code Status: full Family Communication: Updated daughter via phone  [facility] [phone]) Disposition Plan: remain in  the step down unit.   Consultants:  Gen. Surgery: Dr. Lucia Gaskins 05/04/2014  Cardiology: Dr. Jacinta Shoe 05/05/2014  Orthopedics: Dr. Berenice Primas 05/08/2014  PCCM: Dr. Elsworth Soho 05/09/2014  Infectious disease: Dr. Linus Salmons 05/09/2014  Neurology: Dr. Aram Beecham 05/12/2014  Procedures:   CT pelvis 05/04/2014   plain films of the right hip and pelvis 05/04/2014   2-D echo 05/06/2014  Chest x-ray 05/08/2014, 05/06/2014, 05/04/2014  X-ray of the right knee 05/04/2014  CT aspiration 5 mL of bloody purulent material 05/07/2014  CT abdomen and pelvis 05/10/2014  Chest x-ray 05/11/2014  CT head 05/11/2014  CT angiogram chest 05/11/2014  MRI head 05/11/2014  Carotid doppler 05/12/14  Antibiotics:   IV vancomycin   05/04/2014>>>>> 05/11/2014  IV Zosyn  05/04/2014>>>> 05/09/2014   IV Rocephin 05/09/2014  HPI/Subjective:  Patient opens eyes to verbal stimuli. Some incoherent speech. Moaning. Drifts back off to sleep.  Objective: Filed Vitals:   05/14/14 0821  BP:   Pulse:   Temp: 98.8 F (37.1 C)  Resp:     Intake/Output Summary (Last 24 hours) at 05/14/14 0851 Last data filed at 05/14/14 0700  Gross per 24 hour  Intake   2005 ml  Output    810 ml  Net   1195 ml   Filed Weights   05/10/14 0400 05/12/14 0400 05/14/14 0400  Weight: 100.7 kg (222 lb 0.1 oz) 103.7 kg (228 lb 9.9 oz) 103.6 kg (228 lb 6.3 oz)    Exam:   General:   Patient asleep, opens eyes to verbal stimuli. Groans.  Dry mucous membranes.  Cardiovascular:  Irregularly irregular.  Respiratory:  Some  coarse sounds in anterior lung fields.Inreased RR.  Abdomen:  Soft, nontender, nondistended, positive bowel sounds.  Musculoskeletal:  No clubbing cyanosis or edema.  Data Reviewed: Basic Metabolic Panel:  Recent Labs Lab 05/09/14 0915 05/10/14 0345 05/11/14 0340 05/12/14 0355 05/13/14 0408 05/14/14 0400  NA  --  141 143 147* 146* 144  K  --   3.4* 3.4* 4.1 4.0 4.4  CL  --  111 117* 120* 120* 115*  CO2  --  21* 19* 19* 19* 19*  GLUCOSE  --  156* 167* 131* 159* 139*  BUN  --  18 15 20 15 14   CREATININE  --  0.59* 0.62 0.75 0.73 0.77  CALCIUM  --  8.1* 8.2* 8.7* 8.8* 9.1  MG 1.3* 1.9 1.7 2.2  --  1.7  PHOS 3.3  --   --   --   --   --    Liver Function Tests:  Recent Labs Lab 05/12/14 0355  AST 50*  ALT 54  ALKPHOS 115  BILITOT 1.3*  PROT 6.6  ALBUMIN 3.0*   No results for input(s): LIPASE, AMYLASE in the last 168 hours.  Recent Labs Lab 05/09/14 0915  AMMONIA 29   CBC:  Recent Labs Lab 05/10/14 0345 05/11/14 0340 05/12/14 0355 05/13/14 0408 05/14/14 0400  WBC 15.1* 16.7* 18.3* 13.0* 14.1*  HGB 14.3 13.7 13.5 12.9* 13.5  HCT 42.1 39.8 40.2 38.6* 40.5  MCV 96.8 97.1 97.8 97.7 98.5  PLT 344 357 397 369 377   Cardiac Enzymes: No results for input(s): CKTOTAL, CKMB, CKMBINDEX, TROPONINI in the last 168 hours. BNP (last 3 results) No results for input(s): BNP in the last 8760 hours.  ProBNP (last 3 results) No results for input(s): PROBNP in the last 8760 hours.  CBG:  Recent Labs Lab 05/13/14 1734 05/13/14 2027 05/14/14 0043 05/14/14 0502 05/14/14 1194  GLUCAP  153* 169* 143* 141* 122*    Recent Results (from the past 240 hour(s))  Blood Culture (routine x 2)     Status: None   Collection Time: 05/04/14  3:00 PM  Result Value Ref Range Status   Specimen Description BLOOD LEFT WRIST  5 ML IN [facility] BOTTLE  Final   Special Requests NONE  Final   Culture   Final    NO GROWTH 5 DAYS Performed at [facility]    Report Status 05/10/2014 FINAL  Final  Blood Culture (routine x 2)     Status: None   Collection Time: 05/04/14  4:21 PM  Result Value Ref Range Status   Specimen Description BLOOD RIGHT ANTECUBITAL  Final   Special Requests BOTTLES DRAWN AEROBIC AND ANAEROBIC 5CC  Final   Culture   Final    STREPTOCOCCUS GROUP D;high probability for S.bovis Note: Gram Stain Report Called  to,Read Back By and Verified With: CAROLYN OAKLEY 4.30.16 BY MANGR 503PM Performed at [facility]    Report Status 05/08/2014 FINAL  Final   Organism ID, Bacteria STREPTOCOCCUS GROUP D;high probability for S.bovis  Final      Susceptibility   Streptococcus group d;high probability for s.bovis - MIC (ETEST)*    PENICILLIN .064 SENSITIVE Sensitive     * STREPTOCOCCUS GROUP D;high probability for S.bovis  Urine culture     Status: None   Collection Time: 05/04/14  5:51 PM  Result Value Ref Range Status   Specimen Description URINE, CLEAN CATCH  Final   Special Requests NONE  Final   Colony Count   Final    7,000 COLONIES/ML Performed at [facility]    Culture   Final    INSIGNIFICANT GROWTH Performed at [facility]    Report Status 05/05/2014 FINAL  Final  MRSA PCR Screening     Status: None   Collection Time: 05/04/14 11:24 PM  Result Value Ref Range Status   MRSA by PCR NEGATIVE NEGATIVE Final    Comment:        The GeneXpert MRSA Assay (FDA approved for NASAL specimens only), is one component of a comprehensive MRSA colonization surveillance program. It is not intended to diagnose MRSA infection nor to guide or monitor treatment for MRSA infections.   Culture, routine-abscess     Status: None   Collection Time: 05/07/14  2:29 PM  Result Value Ref Range Status   Specimen Description ABSCESS  Final   Special Requests Normal  Final   Gram Stain   Final    ABUNDANT WBC PRESENT,BOTH PMN AND MONONUCLEAR NO SQUAMOUS EPITHELIAL CELLS SEEN NO ORGANISMS SEEN Performed at [facility]    Culture   Final    MODERATE VIRIDANS STREPTOCOCCUS Performed at [facility]    Report Status 05/11/2014 FINAL  Final  Culture, blood (routine x 2)     Status: None (Preliminary result)   Collection Time: 05/08/14  7:00 PM  Result Value Ref Range Status   Specimen Description BLOOD RIGHT HAND  Final   Special Requests BOTTLES DRAWN AEROBIC AND  ANAEROBIC 10CC  Final   Culture   Final           BLOOD CULTURE RECEIVED NO GROWTH TO DATE CULTURE WILL BE HELD FOR 5 DAYS BEFORE ISSUING A FINAL NEGATIVE REPORT Performed at [facility]    Report Status PENDING  Incomplete  Culture, blood (routine x 2)     Status: None (Preliminary result)  Collection Time: 05/08/14  7:15 PM  Result Value Ref Range Status   Specimen Description BLOOD RIGHT ARM  Final   Special Requests BOTTLES DRAWN AEROBIC AND ANAEROBIC 10CC  Final   Culture   Final           BLOOD CULTURE RECEIVED NO GROWTH TO DATE CULTURE WILL BE HELD FOR 5 DAYS BEFORE ISSUING A FINAL NEGATIVE REPORT Performed at [facility]    Report Status PENDING  Incomplete     Studies: No results found.  Scheduled Meds: . antiseptic oral rinse  7 mL Mouth Rinse BID  . cefTRIAXone (ROCEPHIN)  IV  2 g Intravenous Q24H  . febuxostat  80 mg Oral Daily  . feeding supplement (PRO-STAT SUGAR FREE 64)  30 mL Per Tube BID  . folic acid  1 mg Oral Daily  . folic acid  1 mg Intravenous Daily  . hydrALAZINE  10 mg Intravenous Q4H  . insulin aspart  0-9 Units Subcutaneous Q4H  . levalbuterol  0.63 mg Nebulization TID  . magnesium sulfate 1 - 4 g bolus IVPB  3 g Intravenous Once  . multivitamin with minerals  1 tablet Oral Daily  . thiamine  100 mg Oral Daily   Or  . thiamine  100 mg Intravenous Daily   Continuous Infusions: . dextrose 5 % and 0.45% NaCl 1,000 mL with potassium chloride 40 mEq infusion 75 mL/hr at 05/14/14 0552  . diltiazem (CARDIZEM) infusion 10 mg/hr (05/14/14 0700)  . feeding supplement (GLUCERNA 1.2 CAL) Stopped (05/11/14 1830)    Principal Problem:   Sepsis Active Problems:   Streptococcal bacteremia   Acute encephalopathy   Fever   Psoas abscess   New onset atrial fibrillation   Diabetes mellitus type 2, controlled   Essential hypertension   Gout   Psoas abscess, right   Iliopsoas abscess on right   Atrial fibrillation with rapid ventricular  response   Alcoholism   Type 2 diabetes mellitus without complication   Pyrexia   Abscess of male pelvis   Right hip pain   Cerebral infarction due to embolism of left middle cerebral artery   CVA (cerebral infarction)   SDH (subdural hematoma)   Encephalopathy acute    Time spent: 102 mins    [facility]  MD [facility] Pager [phone]. If 7PM-7AM, please contact night-coverage at [email or web address], password [facility] 05/14/2014, 8:51 AM  LOS: 10 days            5

## 2014-05-14 NOTE — Progress Notes (Signed)
Subjective: Patient has remained confused and noncommunicative. No overnight adverse clinical events reported.  Objective: Current vital signs: BP 126/98 mmHg  Pulse 103  Temp(Src) 98.2 F (36.8 C) (Axillary)  Resp 19  Ht 5\' 7"  (1.702 m)  Wt 103.6 kg (228 lb 6.3 oz)  BMI 35.76 kg/m2  SpO2 94%  Neurologic Exam: Patient was somnolent but couldn't be aroused. He was moderately agitated when awakened. There was no verbal output and patient did not follow simple commands. Pupils were equal and reacted normally to light. Extraocular movements were full and conjugate on right left lateral gaze. Face was symmetrical. Patient moved extremities equally to noxious stimuli. Deep tendon reflexes were absent. Plantar responses were mute bilaterally.  Medications: I have reviewed the patient's current medications.  Assessment/Plan: Moderately severe encephalopathic state secondary to multiple factors, including sepsis, subdural hematoma, and left MCA territory infarction. Patient also has a history of alcohol abuse which may be a contributing factor. Carotid Doppler study showed no significant ICA stenosis on either side. Echocardiogram was negative for potential embolus source. Patient has atrial fibrillation, but is not a candidate for anticoagulation.  No changes in current management recommended. We will continue to follow this patient with you.  C.R. Nicole Kindred, MD [facility] [phone]  05/14/2014  1:25 PM

## 2014-05-14 NOTE — Plan of Care (Addendum)
Problem: Phase I Progression Outcomes Goal: Pain controlled with appropriate interventions No pain meds given during shift, watching neuro status closely. Goal: Other Phase I Outcomes/Goals Pt. Progressively more awake and able to verbalize needs.  Initially at beginning of shift, pt was unable to respond to direct question.  Noted marked improvement in mentation and communication throughout shift.  Pt. Able to swallow food as directed by Speech therapy and tolerating very well.

## 2014-05-15 ENCOUNTER — Inpatient Hospital Stay (HOSPITAL_COMMUNITY): Payer: Medicare Other

## 2014-05-15 LAB — MAGNESIUM: Magnesium: 1.9 mg/dL (ref 1.7–2.4)

## 2014-05-15 LAB — LIPID PANEL
Cholesterol: 109 mg/dL (ref 0–200)
HDL: 24 mg/dL — ABNORMAL LOW (ref >40)
LDL CALC: 69 mg/dL (ref 0–99)
TRIGLYCERIDES: 81 mg/dL (ref <150)
Total CHOL/HDL Ratio: 4.5 RATIO
VLDL: 16 mg/dL (ref 0–40)

## 2014-05-15 LAB — COMPREHENSIVE METABOLIC PANEL
ALK PHOS: 111 U/L (ref 38–126)
ALT: 45 U/L (ref 17–63)
ANION GAP: 8 (ref 5–15)
AST: 53 U/L — ABNORMAL HIGH (ref 15–41)
Albumin: 2.5 g/dL — ABNORMAL LOW (ref 3.5–5.0)
BUN: 18 mg/dL (ref 6–20)
CALCIUM: 8.9 mg/dL (ref 8.9–10.3)
CO2: 19 mmol/L — ABNORMAL LOW (ref 22–32)
Chloride: 113 mmol/L — ABNORMAL HIGH (ref 101–111)
Creatinine, Ser: 0.76 mg/dL (ref 0.61–1.24)
GFR calc Af Amer: >60 mL/min (ref >60)
GFR calc non Af Amer: >60 mL/min (ref >60)
Glucose, Bld: 142 mg/dL — ABNORMAL HIGH (ref 70–99)
Potassium: 4.1 mmol/L (ref 3.5–5.1)
Sodium: 140 mmol/L (ref 135–145)
TOTAL PROTEIN: 6.4 g/dL — AB (ref 6.5–8.1)
Total Bilirubin: 0.8 mg/dL (ref 0.3–1.2)

## 2014-05-15 LAB — CULTURE, BLOOD (ROUTINE X 2)
CULTURE: NO GROWTH
Culture: NO GROWTH

## 2014-05-15 LAB — GLUCOSE, CAPILLARY
GLUCOSE-CAPILLARY: 149 mg/dL — AB (ref 70–99)
GLUCOSE-CAPILLARY: 159 mg/dL — AB (ref 70–99)
GLUCOSE-CAPILLARY: 116 mg/dL — AB (ref 70–99)
Glucose-Capillary: 157 mg/dL — ABNORMAL HIGH (ref 70–99)
Glucose-Capillary: 144 mg/dL — ABNORMAL HIGH (ref 70–99)
Glucose-Capillary: 143 mg/dL — ABNORMAL HIGH (ref 70–99)

## 2014-05-15 LAB — CBC
HEMATOCRIT: 37.4 % — AB (ref 39.0–52.0)
HEMOGLOBIN: 12.5 g/dL — AB (ref 13.0–17.0)
MCH: 32.6 pg (ref 26.0–34.0)
MCHC: 33.4 g/dL (ref 30.0–36.0)
MCV: 97.7 fL (ref 78.0–100.0)
Platelets: 344 10*3/uL (ref 150–400)
RBC: 3.83 MIL/uL — ABNORMAL LOW (ref 4.22–5.81)
RDW: 14.7 % (ref 11.5–15.5)
WBC: 15.1 10*3/uL — ABNORMAL HIGH (ref 4.0–10.5)

## 2014-05-15 MED ORDER — FOLIC ACID 5 MG/ML IJ SOLN
1.0000 mg | Freq: Every day | INTRAMUSCULAR | Status: DC
  Administered 2014-05-15 – 2014-05-16 (×2): 1 mg via INTRAVENOUS
  Filled 2014-05-15 (×11): qty 0.2

## 2014-05-15 MED ORDER — IPRATROPIUM-ALBUTEROL 0.5-2.5 (3) MG/3ML IN SOLN: 3.0000 mL | RESPIRATORY_TRACT | Status: DC | PRN

## 2014-05-15 MED ORDER — IOHEXOL 300 MG/ML  SOLN
100.0000 mL | Freq: Once | INTRAMUSCULAR | Status: AC | PRN
  Administered 2014-05-15: 100 mL via INTRAVENOUS

## 2014-05-15 MED ORDER — VITAMINS A & D EX OINT
TOPICAL_OINTMENT | CUTANEOUS | Status: AC
  Administered 2014-05-15: 1
  Filled 2014-05-15: qty 25

## 2014-05-15 MED ORDER — GUAIFENESIN ER 600 MG PO TB12
1200.0000 mg | ORAL_TABLET | Freq: Two times a day (BID) | ORAL | Status: DC
  Administered 2014-05-15 – 2014-05-24 (×16): 1200 mg via ORAL
  Filled 2014-05-15 (×16): qty 2

## 2014-05-15 MED ORDER — FOLIC ACID 1 MG PO TABS
1.0000 mg | ORAL_TABLET | Freq: Every day | ORAL | Status: DC
  Administered 2014-05-17 – 2014-05-24 (×8): 1 mg via ORAL
  Filled 2014-05-15 (×8): qty 1

## 2014-05-15 MED ORDER — DILTIAZEM HCL ER COATED BEADS 240 MG PO CP24
240.0000 mg | ORAL_CAPSULE | Freq: Every day | ORAL | Status: DC
  Administered 2014-05-15 – 2014-05-18 (×4): 240 mg via ORAL
  Filled 2014-05-15: qty 2
  Filled 2014-05-15 (×3): qty 1
  Filled 2014-05-15: qty 2

## 2014-05-15 NOTE — Progress Notes (Signed)
    [facility]  Date of Admission:  05/04/2014  Antibiotics: ceftriaxone  Subjective: No acute events  Objective: Temp:  [98.3 F (36.8 C)-100.9 F (38.3 C)] 98.3 F (36.8 C) (05/10 1200) Pulse Rate:  [89-122] 97 (05/10 1200) Resp:  [12-39] 12 (05/10 1200) BP: (129-181)/(47-110) 161/66 mmHg (05/10 1200) SpO2:  [92 %-99 %] 99 % (05/10 1200) Weight:  [232 lb 12.9 oz (105.6 kg)] 232 lb 12.9 oz (105.6 kg) (05/10 0400)  General: awake, alert, nad Lungs: CTA B Cor: irr irr   Lab Results Lab Results  Component Value Date   WBC 15.1* 05/15/2014   HGB 12.5* 05/15/2014   HCT 37.4* 05/15/2014   MCV 97.7 05/15/2014   PLT 344 05/15/2014    Lab Results  Component Value Date   CREATININE 0.76 05/15/2014   BUN 18 05/15/2014   NA 140 05/15/2014   K 4.1 05/15/2014   CL 113* 05/15/2014   CO2 19* 05/15/2014    Lab Results  Component Value Date   ALT 45 05/15/2014   AST 53* 05/15/2014   ALKPHOS 111 05/15/2014   BILITOT 0.8 05/15/2014      Microbiology: Recent Results (from the past 240 hour(s))  Culture, routine-abscess     Status: None   Collection Time: 05/07/14  2:29 PM  Result Value Ref Range Status   Specimen Description ABSCESS  Final   Special Requests Normal  Final   Gram Stain   Final    ABUNDANT WBC PRESENT,BOTH PMN AND MONONUCLEAR NO SQUAMOUS EPITHELIAL CELLS SEEN NO ORGANISMS SEEN Performed at [facility]    Culture   Final    MODERATE VIRIDANS STREPTOCOCCUS Performed at [facility]    Report Status 05/11/2014 FINAL  Final  Culture, blood (routine x 2)     Status: None   Collection Time: 05/08/14  7:00 PM  Result Value Ref Range Status   Specimen Description BLOOD RIGHT HAND  Final   Special Requests BOTTLES DRAWN AEROBIC AND ANAEROBIC 10CC  Final   Culture   Final    NO GROWTH 5 DAYS Performed at [facility]    Report Status 05/15/2014 FINAL  Final  Culture, blood (routine x 2)     Status:  None   Collection Time: 05/08/14  7:15 PM  Result Value Ref Range Status   Specimen Description BLOOD RIGHT ARM  Final   Special Requests BOTTLES DRAWN AEROBIC AND ANAEROBIC 10CC  Final   Culture   Final    NO GROWTH 5 DAYS Performed at [facility]    Report Status 05/15/2014 FINAL  Final    Studies/Results: No results found.  Assessment/Plan:  1) Psoas abscess - Strep in BCx, Strep viridans in abscess.  Continue with ceftiraxone. Has persistent abscess collection and still febrile with increased WBC despite appropriate antibiotics.  He will need source control for adequate treatment of the abscess.   -recommend IR reevaluation for drainage or surgery.           Scharlene Gloss, [facility] [place] [email or web address] [phone] pager   [phone] cell 05/15/2014, 3:11 PM

## 2014-05-15 NOTE — Progress Notes (Signed)
[facility] PROGRESS NOTE  [name] TSV:[id number] DOB: 03/20/1935 DOA: 05/04/2014 PCP: Lujean Amel, MD  Assessment/Plan: #1 sepsis Patient on presentation had a temperature 101.5  increased heart rate and new onset A. Fib in the presence of infection. Blood cultures with 1 out of 2 grew streptococcal bovis. Right iliopsoas has been aspirated and cultures are consistent with Streptococcus viridans. Repeat CT of the abdomen and pelvis with no significant change in iliopsoas abscess. Patient with low grade fevers. Continue empiric IV Rocephin. Patient has been seen by ID.  #2 acute encephalopathy/subdural hematoma/small CVA Multifactorial in nature secondary to acute infection with psoas abscess, streptococcal bovis bacteremia, probable alcohol withdrawal, small subdural hematoma and small CVA noted on MRI of the head . Patient was more alert yesterday and underwent a swallow evaluation and placed on a dysphagia 1 diet. ABG showed a metabolic alkalosis. B-12 levels within normal limits at 894. CT angiogram of the chest which was done was negative for PE. CT head was negative. MRI was consistent with a small CVA and tiny subdural hematoma. EEG was negative for seizure activity. RPR was nonreactive. HIV was nonreactive. Continue current IV antibiotics. Patient has had a 2-D echo, with no source of emboli. Carotid dopplers done with 40-59% R ICA stenosis and bilateral VA flow antegrade. L ICA not adequately visualized secondary to patient agitation. Unable to place on antiplatelet therapy secondary to subdural hematoma. Discontinued Lovenox. Follow. Neurology following and appreciate input and recommendations.   #3 streptococcal bovis bacteremia Likely seeded from the GI tract. Patient has been seen in consultation by infectious disease and recommended narrowing antibiotics. Concern also for colorectal malignancy. CT of the abdomen and pelvis with no acute intra-abdominal abnormalities. Patient  will likely need a colonoscopy as outpatient. Patient currently on IV Rocephin. ID following and appreciate input and recommendations.  #4 iliopsoas abscess with moderate viridans streptococcus Status post aspiration. Cultures with moderate viridans streptococcus. Continue empiric IV Rocephin. ID following. Follow.  #5 fever Likely secondary to problems #2 and 3. CT abdomen and pelvis with no significant change to psoas abscess. Continue empiric IV antibiotics for now. ID following.  #6 New Onset atrial fibrillation Likely secondary to iliopsoas abscess and bacteremia.  TSH within normal limits. HR 90s this morning. 2-D echo with EF of 60-65% with moderate LVH, aortic sclerosis with trivial AI, mild RAE, moderate MR and TR, dilated IVC. Patient currently on a Cardizem drip. Once more alert may transition to oral long-acting Cardizem for rate control. Patient with a CHADSVASC score 4. Patient likely needs anticoagulation however due to history of chronic alcoholism question of compliance and deemed not anticoagulation candidate per cardiology. Also due to recent small SDH and CVA, will hold off on anticoagulation at this time. When more alert if tolerating oral intake okay, will transition to long-acting Cardizem 240 mg CD and titrate as needed per cardiology recommendations. Cardiology has signed off.   #7 hypertension Currently on Cardizem. When patient is more alert will transition to long-acting Cardizem.  #8 hypokalemia/hypomagnesemia Repleted.  #9 dm 2 Patient has been started on a dysphagia 1 diet per speech therapy. CBGs have ranged from 143-157. Sliding scale insulin.  #10 history of alcohol abuse Patient alert yesterday afternoon and interacting. Ativan prn for agitation.   #11 nutrition Patient has been evaluated by SLP and started on dysphagia 1 diet.  #12 prophylaxis SCDs for DVT prophylaxis.  Code Status: full Family Communication: Updated daughter via phone [facility] [phone]) Disposition Plan: remain  in the step down unit.   Consultants:  Gen. Surgery: Dr. Lucia Gaskins 05/04/2014  Cardiology: Dr. Jacinta Shoe 05/05/2014  Orthopedics: Dr. Berenice Primas 05/08/2014  PCCM: Dr. Elsworth Soho 05/09/2014  Infectious disease: Dr. Linus Salmons 05/09/2014  Neurology: Dr. Aram Beecham 05/12/2014  Procedures:   CT pelvis 05/04/2014   plain films of the right hip and pelvis 05/04/2014   2-D echo 05/06/2014  Chest x-ray 05/08/2014, 05/06/2014, 05/04/2014  X-ray of the right knee 05/04/2014  CT aspiration 5 mL of bloody purulent material 05/07/2014  CT abdomen and pelvis 05/10/2014  Chest x-ray 05/11/2014  CT head 05/11/2014  CT angiogram chest 05/11/2014  MRI head 05/11/2014  Carotid doppler 05/12/14  Antibiotics:   IV vancomycin   05/04/2014>>>>> 05/11/2014  IV Zosyn  05/04/2014>>>> 05/09/2014   IV Rocephin 05/09/2014  HPI/Subjective:  Patient opens eyes to verbal stimuli. Moaning. Drifts back off to sleep. Patient was alert yesterday afternoon, and able to tolerate dysphagia 1 diet.  Objective: Filed Vitals:   05/15/14 0800  BP:   Pulse:   Temp: 99.8 F (37.7 C)  Resp:     Intake/Output Summary (Last 24 hours) at 05/15/14 0926 Last data filed at 05/15/14 0600  Gross per 24 hour  Intake 2014.4 ml  Output   1325 ml  Net  689.4 ml   Filed Weights   05/12/14 0400 05/14/14 0400 05/15/14 0400  Weight: 103.7 kg (228 lb 9.9 oz) 103.6 kg (228 lb 6.3 oz) 105.6 kg (232 lb 12.9 oz)    Exam:   General:   Patient asleep, opens eyes to verbal stimuli. Groans.  Dry mucous membranes.  Cardiovascular:  Irregularly irregular.  Respiratory:  Some  coarse sounds in anterior lung fields.Inreased RR.  Abdomen:  Soft, nontender, nondistended, positive bowel sounds.  Musculoskeletal:  No clubbing cyanosis or edema.  Data Reviewed: Basic Metabolic Panel:  Recent Labs Lab 05/09/14 0915 05/10/14 0345 05/11/14 0340 05/12/14 0355 05/13/14 0408 05/14/14 0400  05/15/14 0350  NA  --  141 143 147* 146* 144 140  K  --  3.4* 3.4* 4.1 4.0 4.4 4.1  CL  --  111 117* 120* 120* 115* 113*  CO2  --  21* 19* 19* 19* 19* 19*  GLUCOSE  --  156* 167* 131* 159* 139* 142*  BUN  --  18 15 20 15 14 18   CREATININE  --  0.59* 0.62 0.75 0.73 0.77 0.76  CALCIUM  --  8.1* 8.2* 8.7* 8.8* 9.1 8.9  MG 1.3* 1.9 1.7 2.2  --  1.7 1.9  PHOS 3.3  --   --   --   --   --   --    Liver Function Tests:  Recent Labs Lab 05/12/14 0355 05/15/14 0350  AST 50* 53*  ALT 54 45  ALKPHOS 115 111  BILITOT 1.3* 0.8  PROT 6.6 6.4*  ALBUMIN 3.0* 2.5*   No results for input(s): LIPASE, AMYLASE in the last 168 hours.  Recent Labs Lab 05/09/14 0915  AMMONIA 29   CBC:  Recent Labs Lab 05/11/14 0340 05/11/14 1017 05/12/14 0355 05/13/14 0408 05/14/14 0400 05/15/14 0350  WBC 16.7*  --  18.3* 13.0* 14.1* 15.1*  HGB 13.7  --  13.5 12.9* 13.5 12.5*  HCT 39.8 39.4 40.2 38.6* 40.5 37.4*  MCV 97.1  --  97.8 97.7 98.5 97.7  PLT 357  --  397 369 377 344   Cardiac Enzymes: No results for input(s): CKTOTAL, CKMB, CKMBINDEX, TROPONINI in the last 168 hours. BNP (last 3 results)  No results for input(s): BNP in the last 8760 hours.  ProBNP (last 3 results) No results for input(s): PROBNP in the last 8760 hours.  CBG:  Recent Labs Lab 05/14/14 1640 05/14/14 2017 05/15/14 0021 05/15/14 0444 05/15/14 0759  GLUCAP 170* 145* 157* 144* 143*    Recent Results (from the past 240 hour(s))  Culture, routine-abscess     Status: None   Collection Time: 05/07/14  2:29 PM  Result Value Ref Range Status   Specimen Description ABSCESS  Final   Special Requests Normal  Final   Gram Stain   Final    ABUNDANT WBC PRESENT,BOTH PMN AND MONONUCLEAR NO SQUAMOUS EPITHELIAL CELLS SEEN NO ORGANISMS SEEN Performed at [facility]    Culture   Final    MODERATE VIRIDANS STREPTOCOCCUS Performed at [facility]    Report Status 05/11/2014 FINAL  Final  Culture, blood  (routine x 2)     Status: None (Preliminary result)   Collection Time: 05/08/14  7:00 PM  Result Value Ref Range Status   Specimen Description BLOOD RIGHT HAND  Final   Special Requests BOTTLES DRAWN AEROBIC AND ANAEROBIC 10CC  Final   Culture   Final           BLOOD CULTURE RECEIVED NO GROWTH TO DATE CULTURE WILL BE HELD FOR 5 DAYS BEFORE ISSUING A FINAL NEGATIVE REPORT Performed at [facility]    Report Status PENDING  Incomplete  Culture, blood (routine x 2)     Status: None (Preliminary result)   Collection Time: 05/08/14  7:15 PM  Result Value Ref Range Status   Specimen Description BLOOD RIGHT ARM  Final   Special Requests BOTTLES DRAWN AEROBIC AND ANAEROBIC 10CC  Final   Culture   Final           BLOOD CULTURE RECEIVED NO GROWTH TO DATE CULTURE WILL BE HELD FOR 5 DAYS BEFORE ISSUING A FINAL NEGATIVE REPORT Performed at [facility]    Report Status PENDING  Incomplete     Studies: No results found.  Scheduled Meds: . antiseptic oral rinse  7 mL Mouth Rinse BID  . cefTRIAXone (ROCEPHIN)  IV  2 g Intravenous Q24H  . febuxostat  80 mg Oral Daily  . feeding supplement (ENSURE ENLIVE)  237 mL Oral BID BM  . feeding supplement (PRO-STAT SUGAR FREE 64)  30 mL Per Tube BID  . folic acid  1 mg Oral Daily  . hydrALAZINE  10 mg Intravenous Q4H  . insulin aspart  0-9 Units Subcutaneous Q4H  . multivitamin with minerals  1 tablet Oral Daily  . thiamine  100 mg Oral Daily   Or  . thiamine  100 mg Intravenous Daily   Continuous Infusions: . dextrose 5 % and 0.45% NaCl 1,000 mL with potassium chloride 40 mEq infusion 75 mL/hr at 05/14/14 2317  . diltiazem (CARDIZEM) infusion 10 mg/hr (05/14/14 2316)  . feeding supplement (GLUCERNA 1.2 CAL) Stopped (05/11/14 1830)    Principal Problem:   Sepsis Active Problems:   Streptococcal bacteremia   Acute encephalopathy   Fever   Psoas abscess   New onset atrial fibrillation   Diabetes mellitus type 2, controlled    Essential hypertension   Gout   Psoas abscess, right   Iliopsoas abscess on right   Atrial fibrillation with rapid ventricular response   Alcoholism   Type 2 diabetes mellitus without complication   Pyrexia   Abscess of male pelvis   Right  hip pain   Cerebral infarction due to embolism of left middle cerebral artery   CVA (cerebral infarction)   SDH (subdural hematoma)   Encephalopathy acute   Cerebral infarction due to unspecified mechanism    Time spent: 40 mins    [facility]  MD [facility] Pager [phone]. If 7PM-7AM, please contact night-coverage at [email or web address], password [facility] 05/15/2014, 9:26 AM  LOS: 11 days            5

## 2014-05-15 NOTE — Progress Notes (Addendum)
Patient ID: [name], male   DOB: May 18, 1935, 79 y.o.   MRN: [id number]   Dr Grandville Silos has called to ask IR to re evaluate this pt.  Post Rt hip replacement He has known Rt iliopsoas abscess per CT 05/04/14 Abscess aspiration/drain attempted 05/06/14:   Status post attempted aspiration of dense fluid collection within the right psoas musculature. No fluid could be aspirated once the 10 French drain was placed into the fluid collection, suggesting non liquified hematoma at this time. No drain was left, as this could lead to potential seeding with bacteria of a sterile hematoma.  Re attempt 5/2 did yield 5 cc pus like material Cx: viridans streptococcus On Rocephin IV now  CT scan 5/5 shows no change in collection  Pt has new spike in temps 400 am 5/9: 100.5 5/10 400 am: 100.9 Wbc 15.1  Dr Grandville Silos and ID has asked IR to re evaluate to see if asp or drain should/could be performed and help in this pts care.  Will re CT Pelvis w Cx Have imaging reviewed with Int Radiologist 5/11 Plan accordingly  Dr Grandville Silos agrees to plan

## 2014-05-15 NOTE — Progress Notes (Signed)
Speech Language Pathology Treatment: Dysphagia  Patient Details Name: [name] MRN: [id number] DOB: Jan 11, 1935 Today's Date: 05/15/2014 Time: 1607-3710 SLP Time Calculation (min) (ACUTE ONLY): 15 min  Assessment / Plan / Recommendation Clinical Impression  Pt with much improved mentation today = resulting in improved swallow ability.  SLP observed pt self feeding cracker and milk.  Prolonged mastication noted (nearly vertical mastication pattern) with no residuals nor indications of airway compromise.  Pt did demonstrate s/s of aspiration with milk via straw x1 *large bolus consumption.  Recommend advance to dys3/thin.  SlP to follow up once more to assure tolerance and for family education.  Anticipate swallow function to return to baseline.     HPI Other Pertinent Information: 79 yo male adm to [facility] with AMS, found to be septic - pt hospital course complicated by his lethargy.  Found to have a small SDH.  PMH + for DM, ETOH abuse.  Swallow evaluation ordered.  CT chest showed mild ATX and mild effusion.     Pertinent Vitals Pain Assessment: No/denies pain  SLP Plan  Continue with current plan of care    Recommendations Diet recommendations: Dysphagia 3 (mechanical soft) Liquids provided via: Straw;Cup Medication Administration: Whole meds with puree Supervision: Patient able to self feed (set up assist) Compensations: Small sips/bites;Slow rate Postural Changes and/or Swallow Maneuvers: Seated upright 90 degrees;Upright 30-60 min after meal              Oral Care Recommendations: Oral care BID Plan: Continue with current plan of care    [place], [place] [facility] SLP [phone]

## 2014-05-16 ENCOUNTER — Ambulatory Visit (HOSPITAL_COMMUNITY): Payer: Medicare Other

## 2014-05-16 LAB — GLUCOSE, CAPILLARY
GLUCOSE-CAPILLARY: 139 mg/dL — AB (ref 70–99)
Glucose-Capillary: 140 mg/dL — ABNORMAL HIGH (ref 70–99)
Glucose-Capillary: 136 mg/dL — ABNORMAL HIGH (ref 70–99)

## 2014-05-16 LAB — BASIC METABOLIC PANEL
Anion gap: 11 (ref 5–15)
BUN: 15 mg/dL (ref 6–20)
CO2: 18 mmol/L — ABNORMAL LOW (ref 22–32)
Calcium: 9.1 mg/dL (ref 8.9–10.3)
Chloride: 109 mmol/L (ref 101–111)
Creatinine, Ser: 0.67 mg/dL (ref 0.61–1.24)
GFR calc Af Amer: >60 mL/min (ref >60)
Glucose, Bld: 154 mg/dL — ABNORMAL HIGH (ref 70–99)
POTASSIUM: 4.2 mmol/L (ref 3.5–5.1)
SODIUM: 138 mmol/L (ref 135–145)

## 2014-05-16 LAB — CBC
HCT: 40 % (ref 39.0–52.0)
Hemoglobin: 13 g/dL (ref 13.0–17.0)
MCH: 31.6 pg (ref 26.0–34.0)
MCHC: 32.5 g/dL (ref 30.0–36.0)
MCV: 97.3 fL (ref 78.0–100.0)
Platelets: 322 10*3/uL (ref 150–400)
RBC: 4.11 MIL/uL — AB (ref 4.22–5.81)
RDW: 14.3 % (ref 11.5–15.5)
WBC: 12.7 10*3/uL — ABNORMAL HIGH (ref 4.0–10.5)

## 2014-05-16 LAB — MAGNESIUM: Magnesium: 1.6 mg/dL — ABNORMAL LOW (ref 1.7–2.4)

## 2014-05-16 MED ORDER — MIDAZOLAM HCL 2 MG/2ML IJ SOLN
INTRAMUSCULAR | Status: AC
  Filled 2014-05-16: qty 2

## 2014-05-16 MED ORDER — MIDAZOLAM HCL 2 MG/2ML IJ SOLN
INTRAMUSCULAR | Status: AC | PRN
  Administered 2014-05-16: 1 mg via INTRAVENOUS

## 2014-05-16 MED ORDER — FENTANYL CITRATE (PF) 100 MCG/2ML IJ SOLN
INTRAMUSCULAR | Status: AC | PRN
  Administered 2014-05-16: 50 ug via INTRAVENOUS

## 2014-05-16 MED ORDER — FENTANYL CITRATE (PF) 100 MCG/2ML IJ SOLN
INTRAMUSCULAR | Status: AC
  Filled 2014-05-16: qty 2

## 2014-05-16 MED ORDER — MIDAZOLAM HCL 2 MG/2ML IJ SOLN
INTRAMUSCULAR | Status: AC
  Filled 2014-05-16: qty 6

## 2014-05-16 MED ORDER — MIDAZOLAM HCL 2 MG/2ML IJ SOLN
INTRAMUSCULAR | Status: AC | PRN
  Administered 2014-05-16 (×3): 1 mg via INTRAVENOUS

## 2014-05-16 NOTE — Progress Notes (Signed)
[facility] for Infectious Disease  Date of Admission:  05/04/2014  Antibiotics: ceftriaxone  Subjective: No acute events  Objective: Temp:  [97.7 F (36.5 C)-99.2 F (37.3 C)] 98.2 F (36.8 C) (05/11 1200) Pulse Rate:  [65-112] 80 (05/11 1400) Resp:  [10-26] 26 (05/11 1400) BP: (112-175)/(58-124) 134/68 mmHg (05/11 1400) SpO2:  [94 %-98 %] 95 % (05/11 1400) Weight:  [233 lb 11 oz (106 kg)] 233 lb 11 oz (106 kg) (05/11 0400)  General: awake, alert, nad Lungs: CTA B Cor: irr irr   Lab Results Lab Results  Component Value Date   WBC 12.7* 05/16/2014   HGB 13.0 05/16/2014   HCT 40.0 05/16/2014   MCV 97.3 05/16/2014   PLT 322 05/16/2014    Lab Results  Component Value Date   CREATININE 0.67 05/16/2014   BUN 15 05/16/2014   NA 138 05/16/2014   K 4.2 05/16/2014   CL 109 05/16/2014   CO2 18* 05/16/2014    Lab Results  Component Value Date   ALT 45 05/15/2014   AST 53* 05/15/2014   ALKPHOS 111 05/15/2014   BILITOT 0.8 05/15/2014      Microbiology: Recent Results (from the past 240 hour(s))  Culture, routine-abscess     Status: None   Collection Time: 05/07/14  2:29 PM  Result Value Ref Range Status   Specimen Description ABSCESS  Final   Special Requests Normal  Final   Gram Stain   Final    ABUNDANT WBC PRESENT,BOTH PMN AND MONONUCLEAR NO SQUAMOUS EPITHELIAL CELLS SEEN NO ORGANISMS SEEN Performed at [facility]    Culture   Final    MODERATE VIRIDANS STREPTOCOCCUS Performed at [facility]    Report Status 05/11/2014 FINAL  Final  Culture, blood (routine x 2)     Status: None   Collection Time: 05/08/14  7:00 PM  Result Value Ref Range Status   Specimen Description BLOOD RIGHT HAND  Final   Special Requests BOTTLES DRAWN AEROBIC AND ANAEROBIC 10CC  Final   Culture   Final    NO GROWTH 5 DAYS Performed at [facility]    Report Status 05/15/2014 FINAL  Final  Culture, blood (routine x 2)     Status: None   Collection Time: 05/08/14  7:15 PM  Result Value Ref Range Status   Specimen Description BLOOD RIGHT ARM  Final   Special Requests BOTTLES DRAWN AEROBIC AND ANAEROBIC 10CC  Final   Culture   Final    NO GROWTH 5 DAYS Performed at [facility]    Report Status 05/15/2014 FINAL  Final    Studies/Results: Ct Pelvis W Contrast  05/15/2014   CLINICAL DATA:  Reassess known right iliopsoas abscess. Previously drained. Patient continues to have fevers.  EXAM: CT PELVIS WITH CONTRAST  TECHNIQUE: Multidetector CT imaging of the pelvis was performed using the standard protocol following the bolus administration of intravenous contrast.  CONTRAST:  150mL OMNIPAQUE IOHEXOL 300 MG/ML  SOLN  COMPARISON:  05/10/2014 and 05/04/2014  FINDINGS: Right hip prosthesis causes moderate streak artifact. Again noted is patient's fluid collection within the right iliopsoas muscle/bursa at the level of the hip and extending superiorly to approximately the L5 level. This measures approximately 4 x 4.7 cm in transverse in AP dimension at the level of the right acetabulum (previously 3.8 x 5.1 cm). No adjacent osteolysis.  Ascites unchanged. Moderate calcified plaque over the abdominal aorta and iliac arteries. Small paraumbilical hernia and small bilateral  inguinal hernias unchanged. Moderate diverticulosis of the colon and moderate fecal retention over the rectum. Mild degenerate change of the left hip and moderate degenerative change of the spine. Remainder the exam is unchanged.  IMPRESSION: Continued evidence of patient's known right iliopsoas muscle/bursal abscess measuring 4 x 4.7 cm in transverse in AP dimension (previously 3.8 x 5.1 cm).  Other chronic findings as described unchanged.   Electronically Signed   By: Marin Olp M.D.   On: 05/15/2014 16:54    Assessment/Plan:  1) Psoas abscess - Strep in BCx, Strep viridans in abscess.  Continue with ceftiraxone. Has persistent abscess collection reconfirmed on  new scan today and still with increased WBC despite appropriate antibiotics.  He will need source control for adequate treatment of the abscess and IR to re-attempt drainage.            Scharlene Gloss, [place] [facility] [place] [email or web address] [phone] pager   [phone] cell 05/16/2014, 3:31 PM

## 2014-05-16 NOTE — Progress Notes (Signed)
Date: May 16, 2014 Chart reviewed for concurrent status and case management needs. Daxton Nydam, RN, BSN, CCM   [phone] 

## 2014-05-16 NOTE — Progress Notes (Signed)
Subjective: Patient had no complaints other than pain involving his right hip area. No overnight adverse clinical events reported.  Objective: Current vital signs: BP 157/124 mmHg  Pulse 105  Temp(Src) 99.2 F (37.3 C) (Axillary)  Resp 12  Ht 5\' 7"  (1.702 m)  Wt 106 kg (233 lb 11 oz)  BMI 36.59 kg/m2  SpO2 98%  Neurologic Exam: Patient was alert and in no acute distress. He was oriented to his correct age and year of birth but disoriented to current month. There were no indications of receptive nor expressive aphasia. Extraocular movements were full and conjugate. Face was symmetrical with no focal weakness. Speech was clear with no dysarthria. Patient was able to move extremities with good strength except for right lower extremity proximally, with significant pain with movement at the hip.  Medications: I have reviewed the patient's current medications.  Assessment/Plan: Altered mental status with encephalopathy secondary to multiple factors, including sepsis, subdural hematoma and left MCA territory stroke. Patient's mental status has improved significantly over the past 48 hours, however. He is remaining awake and is communicative, although still moderately confused.  Recommend making no changes in current management. We will continue to follow this patient with you.  C.R. Nicole Kindred, MD [facility] [phone]  05/16/2014  10:33 AM

## 2014-05-16 NOTE — Progress Notes (Signed)
Patient Name: [name] Date of Encounter: 05/16/2014  Principal Problem:   Sepsis Active Problems:   Fever   Psoas abscess   New onset atrial fibrillation   Diabetes mellitus type 2, controlled   Essential hypertension   Gout   Psoas abscess, right   Iliopsoas abscess on right   Atrial fibrillation with rapid ventricular response   Alcoholism   Type 2 diabetes mellitus without complication   Pyrexia   Abscess of male pelvis   Right hip pain   Streptococcal bacteremia   Acute encephalopathy   Cerebral infarction due to embolism of left middle cerebral artery   CVA (cerebral infarction)   SDH (subdural hematoma)   Encephalopathy acute   Cerebral infarction due to unspecified mechanism   Primary Cardiologist: Rolan Bucco  Patient Profile: 79 yo male w/ hx HTN, DM2, prior tob, chronic ETOH (drinks 3 glasses of vodka daily), and gout, admitted 4/29 w/ sepsis, 2nd iliopsoas abscess, for aspiration and drainage by IR. Cards saw for atrial fib, new dx. Had  acute encephalopathy, then had [place] 05/07.   SUBJECTIVE: Awake, confused, oriented to name only.  OBJECTIVE Filed Vitals:   05/16/14 0400 05/16/14 0600 05/16/14 0636 05/16/14 0800  BP: 112/64 113/76 113/76 157/124  Pulse:  106  105  Temp: 99 F (37.2 C)   99.2 F (37.3 C)  TempSrc: Oral   Axillary  Resp: 19 17  12   Height:      Weight: 233 lb 11 oz (106 kg)     SpO2:  94%  98%    Intake/Output Summary (Last 24 hours) at 05/16/14 0934 Last data filed at 05/16/14 0800  Gross per 24 hour  Intake 1673.83 ml  Output    725 ml  Net 948.83 ml   Filed Weights   05/14/14 0400 05/15/14 0400 05/16/14 0400  Weight: 228 lb 6.3 oz (103.6 kg) 232 lb 12.9 oz (105.6 kg) 233 lb 11 oz (106 kg)    PHYSICAL EXAM General: Well developed, well nourished, male in no acute distress. Head: Normocephalic, atraumatic.  Neck: Supple without bruits, JVD not elevated. Lungs:  Resp regular and unlabored, few rales  bases. Heart: Irreg irreg, S1, S2, no S3, S4, soft murmur; no rub. Abdomen: Soft, non-tender, non-distended, BS + x 4.  Extremities: No clubbing, cyanosis, no edema.  Neuro: Alert and oriented X 3. Moves all extremities spontaneously. Psych: Normal affect.  LABS: CBC: Recent Labs  05/15/14 0350 05/16/14 0408  WBC 15.1* 12.7*  HGB 12.5* 13.0  HCT 37.4* 40.0  MCV 97.7 97.3  PLT 344 742   Basic Metabolic Panel: Recent Labs  05/15/14 0350 05/16/14 0408  NA 140 138  K 4.1 4.2  CL 113* 109  CO2 19* 18*  GLUCOSE 142* 154*  BUN 18 15  CREATININE 0.76 0.67  CALCIUM 8.9 9.1  MG 1.9 1.6*   Liver Function Tests: Recent Labs  05/15/14 0350  AST 53*  ALT 45  ALKPHOS 111  BILITOT 0.8  PROT 6.4*  ALBUMIN 2.5*   Fasting Lipid Panel: Recent Labs  05/15/14 0350  CHOL 109  HDL 24*  LDLCALC 69  TRIG 81  CHOLHDL 4.5   Thyroid Function Tests:  Lab Results  Component Value Date   TSH 1.523 05/05/2014   TELE:   Atrial fib, RVR at times, HR generally > 80     Radiology/Studies: Ct Pelvis W Contrast 05/15/2014   CLINICAL DATA:  Reassess known right iliopsoas abscess. Previously  drained. Patient continues to have fevers.  EXAM: CT PELVIS WITH CONTRAST  TECHNIQUE: Multidetector CT imaging of the pelvis was performed using the standard protocol following the bolus administration of intravenous contrast.  CONTRAST:  175mL OMNIPAQUE IOHEXOL 300 MG/ML  SOLN  COMPARISON:  05/10/2014 and 05/04/2014  FINDINGS: Right hip prosthesis causes moderate streak artifact. Again noted is patient's fluid collection within the right iliopsoas muscle/bursa at the level of the hip and extending superiorly to approximately the L5 level. This measures approximately 4 x 4.7 cm in transverse in AP dimension at the level of the right acetabulum (previously 3.8 x 5.1 cm). No adjacent osteolysis.  Ascites unchanged. Moderate calcified plaque over the abdominal aorta and iliac arteries. Small paraumbilical  hernia and small bilateral inguinal hernias unchanged. Moderate diverticulosis of the colon and moderate fecal retention over the rectum. Mild degenerate change of the left hip and moderate degenerative change of the spine. Remainder the exam is unchanged.  IMPRESSION: Continued evidence of patient's known right iliopsoas muscle/bursal abscess measuring 4 x 4.7 cm in transverse in AP dimension (previously 3.8 x 5.1 cm).  Other chronic findings as described unchanged.   Electronically Signed   By: Marin Olp M.D.   On: 05/15/2014 16:54     Current Medications:  . antiseptic oral rinse  7 mL Mouth Rinse BID  . cefTRIAXone (ROCEPHIN)  IV  2 g Intravenous Q24H  . diltiazem  240 mg Oral Daily  . febuxostat  80 mg Oral Daily  . feeding supplement (ENSURE ENLIVE)  237 mL Oral BID BM  . feeding supplement (PRO-STAT SUGAR FREE 64)  30 mL Per Tube BID  . folic acid  1 mg Intravenous Daily   Or  . folic acid  1 mg Oral Daily  . guaiFENesin  1,200 mg Oral BID  . hydrALAZINE  10 mg Intravenous Q4H  . insulin aspart  0-9 Units Subcutaneous Q4H  . multivitamin with minerals  1 tablet Oral Daily  . thiamine  100 mg Oral Daily   Or  . thiamine  100 mg Intravenous Daily   . dextrose 5 % and 0.45% NaCl 1,000 mL with potassium chloride 40 mEq infusion 75 mL/hr at 05/16/14 0702  . feeding supplement (GLUCERNA 1.2 CAL) Stopped (05/11/14 1830)    ASSESSMENT AND PLAN: 1. Persistent atrial fibrillation with intermittent RVR.  Rate controlled. CHADSVASC score 4. Currently off IV diltiazem (was at 10 mg/h). If takes PO Dilt 240, can increase to 300 mg. EF 60-65%, normal wall motion, Ao sclerosis, moderate MR, RV moderate dilation, moderate TR, PA pressure 45mmHg. Not an anticoagulation candidate.   2. Essential hypertension.  On IV Hydralazine 10 mg Q4 Hr scheduled. Mgt per IM.  Otherwise, per IM Principal Problem:   Sepsis Active Problems:   Fever   Psoas abscess   New onset atrial  fibrillation   Diabetes mellitus type 2, controlled   Essential hypertension   Gout   Psoas abscess, right   Iliopsoas abscess on right   Atrial fibrillation with rapid ventricular response   Alcoholism   Type 2 diabetes mellitus without complication   Pyrexia   Abscess of male pelvis   Right hip pain   Streptococcal bacteremia   Acute encephalopathy   Cerebral infarction due to embolism of left middle cerebral artery   CVA (cerebral infarction)   SDH (subdural hematoma)   Encephalopathy acute   Cerebral infarction due to unspecified mechanism   Jonetta Speak , PA-C 9:34 AM 05/16/2014  Attending Note:   The patient was seen and examined.  Agree with assessment and plan as noted above.  Changes made to the above note as needed.  Pt had drainage of his psoas abscess today Still in atrial fib.  HR is wll controlled. BP is well controlled.   Still i atrial fib.  Not a candidate for anticoagulation No active cardiac issues. Will sign off. Call for questions   Ramond Dial., MD, [facility] 05/16/2014, 6:18 PM [address],  [place] Pager [phone]

## 2014-05-16 NOTE — Procedures (Signed)
Interventional Radiology Procedure Note  Procedure:  1.) Aspiration right hip joint yields 10 mL thick, brown purulent material.  Sent for cell count, gram stain and culture 2.) Placement 24F drain into right psoas muscle in the medial thigh with aspiration of ~120 mL similar appearing thick, brown purulent material.  Drain to JP bulb.  Complications: None  Estimated Blood Loss: 0  Recommendations: - Cultures and cell count pending - Drain to JP bulb suction - Ortho, Dr. Berenice Primas, alerted about hip fluid.   Signed,  Criselda Peaches, MD

## 2014-05-16 NOTE — Progress Notes (Signed)
I spoke with interventional radiology today.  I reviewed the CAT scans from 5/5 and 5/10 with the radiologist.  It is unclear that there is any fluid in the actual hip joint.  Certainly there is concern when there is pus and the iliopsoas muscle but it could be communicating with the hip joint.  Our plan was to from a lateral approach so that it would not affect the hip joint aspirate the hip to see if there is fluid and/or bacteria in the hip joint as a second procedure to the drainage type procedure they were intending to do.  Taking out the hip joint would be a major undertaking, but if it were identified at this were a source of infection it might be something to consider.  At this point we will await the results of the aspiration and we will give considerations based on those findings.

## 2014-05-16 NOTE — Progress Notes (Signed)
Subjective: Pt awake, conversant but still confused; continued rt hip pain; request received again for CT guided aspiration/possible drainage of right iliopsoas abscess (previously attempted on 05/07/14 yielding 5 ml -cultures grew viridans streptococcus); follow-up CT pelvis today reveals continued evidence of the right iliopsoas/bursal abscess measuring 4 x 4 0.7 cm (previously 3.8 x 5.1 cm ).  Objective: Vital signs in last 24 hours: Temp:  [97.7 F (36.5 C)-99.2 F (37.3 C)] 99.2 F (37.3 C) (05/11 0800) Pulse Rate:  [65-112] 105 (05/11 0800) Resp:  [10-19] 12 (05/11 0800) BP: (112-169)/(60-124) 157/124 mmHg (05/11 0800) SpO2:  [94 %-99 %] 98 % (05/11 0800) Weight:  [233 lb 11 oz (106 kg)] 233 lb 11 oz (106 kg) (05/11 0400) Last BM Date: 05/16/14  Intake/Output from previous day: 05/10 0701 - 05/11 0700 In: 1778.8 [I.V.:1728.8; IV Piggyback:50] Out: 725 [Urine:725] Intake/Output this shift: Total I/O In: 300 [I.V.:300] Out: -   Patient awake, confused. Chest-slightly diminished breath sounds at bases, heart irregularly irregular, positive murmur; abdomen soft, positive bowel sounds, nontender ;extremities with full range of motion, no significant edema.  Lab Results:   Recent Labs  05/15/14 0350 05/16/14 0408  WBC 15.1* 12.7*  HGB 12.5* 13.0  HCT 37.4* 40.0  PLT 344 322   BMET  Recent Labs  05/15/14 0350 05/16/14 0408  NA 140 138  K 4.1 4.2  CL 113* 109  CO2 19* 18*  GLUCOSE 142* 154*  BUN 18 15  CREATININE 0.76 0.67  CALCIUM 8.9 9.1   PT/INR No results for input(s): LABPROT, INR in the last 72 hours. ABG No results for input(s): PHART, HCO3 in the last 72 hours.  Invalid input(s): PCO2, PO2  Studies/Results: Ct Pelvis W Contrast  05/15/2014   CLINICAL DATA:  Reassess known right iliopsoas abscess. Previously drained. Patient continues to have fevers.  EXAM: CT PELVIS WITH CONTRAST  TECHNIQUE: Multidetector CT imaging of the pelvis was performed  using the standard protocol following the bolus administration of intravenous contrast.  CONTRAST:  158mL OMNIPAQUE IOHEXOL 300 MG/ML  SOLN  COMPARISON:  05/10/2014 and 05/04/2014  FINDINGS: Right hip prosthesis causes moderate streak artifact. Again noted is patient's fluid collection within the right iliopsoas muscle/bursa at the level of the hip and extending superiorly to approximately the L5 level. This measures approximately 4 x 4.7 cm in transverse in AP dimension at the level of the right acetabulum (previously 3.8 x 5.1 cm). No adjacent osteolysis.  Ascites unchanged. Moderate calcified plaque over the abdominal aorta and iliac arteries. Small paraumbilical hernia and small bilateral inguinal hernias unchanged. Moderate diverticulosis of the colon and moderate fecal retention over the rectum. Mild degenerate change of the left hip and moderate degenerative change of the spine. Remainder the exam is unchanged.  IMPRESSION: Continued evidence of patient's known right iliopsoas muscle/bursal abscess measuring 4 x 4.7 cm in transverse in AP dimension (previously 3.8 x 5.1 cm).  Other chronic findings as described unchanged.   Electronically Signed   By: Marin Olp M.D.   On: 05/15/2014 16:54    Anti-infectives: Anti-infectives    Start     Dose/Rate Route Frequency Ordered Stop   05/09/14 1600  cefTRIAXone (ROCEPHIN) 2 g in dextrose 5 % 50 mL IVPB - Premix     2 g 100 mL/hr over 30 Minutes Intravenous Every 24 hours 05/09/14 1451     05/08/14 1600  vancomycin (VANCOCIN) 1,250 mg in sodium chloride 0.9 % 250 mL IVPB  Status:  Discontinued  1,250 mg 166.7 mL/hr over 90 Minutes Intravenous Every 12 hours 05/08/14 0629 05/11/14 1515   05/05/14 0500  vancomycin (VANCOCIN) IVPB 750 mg/150 ml premix  Status:  Discontinued     750 mg 150 mL/hr over 60 Minutes Intravenous Every 12 hours 05/04/14 1642 05/08/14 0629   05/04/14 2300  piperacillin-tazobactam (ZOSYN) IVPB 3.375 g  Status:  Discontinued      3.375 g 12.5 mL/hr over 240 Minutes Intravenous Every 8 hours 05/04/14 1642 05/09/14 1437   05/04/14 1645  vancomycin (VANCOCIN) IVPB 1000 mg/200 mL premix     1,000 mg 200 mL/hr over 60 Minutes Intravenous NOW 05/04/14 1642 05/04/14 1900   05/04/14 1615  piperacillin-tazobactam (ZOSYN) IVPB 3.375 g     3.375 g 100 mL/hr over 30 Minutes Intravenous  Once 05/04/14 1603 05/04/14 1900   05/04/14 1615  vancomycin (VANCOCIN) IVPB 1000 mg/200 mL premix     1,000 mg 200 mL/hr over 60 Minutes Intravenous  Once 05/04/14 1603 05/04/14 1900      Assessment/Plan:  Patient with persistent right iliopsoas abscess with previous cultures revealing moderate viridans streptococcus ; patient continues to be febrile with increased white blood cell count despite appropriate antibiotics. Request now received for reattempt at aspiration and possible drainage of the right iliopsoas abscess. Details/risks of procedure, including not limited to internal bleeding, infection, injury to adjacent structures, inability to aspirate fluid from site discussed with patient's daughter with her understanding and consent.   LOS: 12 days    ALLRED,D [facility] 05/16/2014

## 2014-05-16 NOTE — Progress Notes (Addendum)
PROGRESS NOTE  [name] XLK:[id number] DOB: May 30, 1935 DOA: 05/04/2014 PCP: Lujean Amel, MD  HPI/Recap of past 63 hours: 79 year old male with past medical history of chronic alcoholism, diabetes mellitus and hypertension who presented to emergency room on 4/29 because of right hip and right lower quadrant abdominal pain that had been ongoing for a week along with associated fevers and chills. CT of abdomen and pelvis noted possible iliopsoas abscess as well as new onset atrial fibrillation by EKG. Admitted to the hospitalist service with surgery and cardiology consultation. Patient started on empiric antibiotics and felt to be in sepsis.  Blood cultures positive for strep bovis. Iliopsoas muscle aspirated with cultures consistent with strep viridans. Infectious disease consulted for persistent fevers. Given patient's confusion, MRI done noting subdural hematoma and small CVA. Neurology consulted. Unable to place on antiplatelet therapy secondary to subdural hematoma. Initially on Cardizem drip , transitioned to by mouth and although chads 2 score at 4, because of compliance and chronic alcoholism, felt to be high risk for anticoagulation, especially in the setting of subdural. Over the course of the past 24 hours, patient has become more alert and interactive. He himself has no complaints  Addendum: Spoke with interventional radiology. Orthopedics asked IR to also aspirate hip and large amount of purulent fluid withdrawn. Patient has a septic hip joint. Given multiple medical issues, this is going to be very difficult and poor prognosis.  Assessment/Plan: Principal Problem:   Sepsis felt to be secondary to iliopsoas abscess, septic hip plus streptococcal bovis patient meets criteria given temperature, tachycardia and focal source. Persistent fevers. Repeat CT of the abdomen and pelvis noted no change in abscess, no evidence of colonic obstruction. Checking CEA level. Seen in consultation by  infectious disease with narrowed antibiotics. Suspicion for colorectal malignancy.  Once patient is more stabilized, would favor if possible getting GI consultation possible colonoscopy in the hospital as patient likely would have very poor follow-up and compliance.  Continue biotics, interventional radiology reassessing for repeat aspiration attempt  Active Problems:    New onset atrial fibrillation: Felt to be secondary to iliopsoas abscess bacteremia. TSH normal. 2-D echo done noted preserved ejection fraction with moderate MR and tricuspid regurg. On Cardizem drip initially and then transition to by mouth. Heart rate staying under 100.    Diabetes mellitus type 2, controlled: Blood sugars have remained stable   Essential hypertension    Alcoholism: has been kept on when necessary Ativan, out of window for withdrawals now.    Acute encephalopathy: Multifactorial secondary subdural hematoma, CVA, sepsis and chronic alcoholism. Starting to become more alert and interactive, although still chronically confused   Cerebral infarction due to embolism of left middle cerebral artery/Subdural hematoma: Currently not on any prophylaxis secondary to subdural  Septic hip joint: Orthopedics following. [place]. Continue IV antibiotics. Will update infectious disease.   Code Status: Full code  Family Communication: Left message with daughter  Disposition Plan: Stable from acute standpoint, transferred to floor  Consultants:  Infectious disease Interventional radiology Neurology Critical care Orthopedics Cardiology General surgery  Procedures:   Attempted aspiration of iliopsoas muscle on 5/2: Unsuccessful  Antibiotics: IV vancomycin 4/29-5/6 IV Zosyn 4/29-5/4 IV Rocephin 5/4-present  Objective: BP 123/63 mmHg  Pulse 80  Temp(Src) 98.2 F (36.8 C) (Oral)  Resp 16  Ht 5\' 7"  (1.702 m)  Wt 106 kg (233 lb 11 oz)  BMI 36.59 kg/m2  SpO2 99%  Intake/Output Summary (Last 24  hours) at 05/16/14 1554 Last data filed  at 05/16/14 1400  Gross per 24 hour  Intake   1640 ml  Output   1050 ml  Net    590 ml   Filed Weights   05/14/14 0400 05/15/14 0400 05/16/14 0400  Weight: 103.6 kg (228 lb 6.3 oz) 105.6 kg (232 lb 12.9 oz) 106 kg (233 lb 11 oz)    Exam:   General:  Confused, no acute distress  Cardiovascular: Regular rate and rhythm, S1-S2  Respiratory: Clear to auscultation bilaterally  Abdomen: Soft, nontender?, Nondistended, hypoactive bowel sounds  Musculoskeletal: Trace pitting edema, no clubbing or cyanosis   Data Reviewed: Basic Metabolic Panel:  Recent Labs Lab 05/11/14 0340 05/12/14 0355 05/13/14 0408 05/14/14 0400 05/15/14 0350 05/16/14 0408  NA 143 147* 146* 144 140 138  K 3.4* 4.1 4.0 4.4 4.1 4.2  CL 117* 120* 120* 115* 113* 109  CO2 19* 19* 19* 19* 19* 18*  GLUCOSE 167* 131* 159* 139* 142* 154*  BUN 15 20 15 14 18 15   CREATININE 0.62 0.75 0.73 0.77 0.76 0.67  CALCIUM 8.2* 8.7* 8.8* 9.1 8.9 9.1  MG 1.7 2.2  --  1.7 1.9 1.6*   Liver Function Tests:  Recent Labs Lab 05/12/14 0355 05/15/14 0350  AST 50* 53*  ALT 54 45  ALKPHOS 115 111  BILITOT 1.3* 0.8  PROT 6.6 6.4*  ALBUMIN 3.0* 2.5*   No results for input(s): LIPASE, AMYLASE in the last 168 hours. No results for input(s): AMMONIA in the last 168 hours. CBC:  Recent Labs Lab 05/12/14 0355 05/13/14 0408 05/14/14 0400 05/15/14 0350 05/16/14 0408  WBC 18.3* 13.0* 14.1* 15.1* 12.7*  HGB 13.5 12.9* 13.5 12.5* 13.0  HCT 40.2 38.6* 40.5 37.4* 40.0  MCV 97.8 97.7 98.5 97.7 97.3  PLT 397 369 377 344 322   Cardiac Enzymes:   No results for input(s): CKTOTAL, CKMB, CKMBINDEX, TROPONINI in the last 168 hours. BNP (last 3 results) No results for input(s): BNP in the last 8760 hours.  ProBNP (last 3 results) No results for input(s): PROBNP in the last 8760 hours.  CBG:  Recent Labs Lab 05/15/14 1213 05/15/14 2005 05/16/14 0346 05/16/14 0742 05/16/14 1124    GLUCAP 159* 116* 140* 139* 136*    Recent Results (from the past 240 hour(s))  Culture, routine-abscess     Status: None   Collection Time: 05/07/14  2:29 PM  Result Value Ref Range Status   Specimen Description ABSCESS  Final   Special Requests Normal  Final   Gram Stain   Final    ABUNDANT WBC PRESENT,BOTH PMN AND MONONUCLEAR NO SQUAMOUS EPITHELIAL CELLS SEEN NO ORGANISMS SEEN Performed at [facility]    Culture   Final    MODERATE VIRIDANS STREPTOCOCCUS Performed at [facility]    Report Status 05/11/2014 FINAL  Final  Culture, blood (routine x 2)     Status: None   Collection Time: 05/08/14  7:00 PM  Result Value Ref Range Status   Specimen Description BLOOD RIGHT HAND  Final   Special Requests BOTTLES DRAWN AEROBIC AND ANAEROBIC 10CC  Final   Culture   Final    NO GROWTH 5 DAYS Performed at [facility]    Report Status 05/15/2014 FINAL  Final  Culture, blood (routine x 2)     Status: None   Collection Time: 05/08/14  7:15 PM  Result Value Ref Range Status   Specimen Description BLOOD RIGHT ARM  Final   Special Requests BOTTLES DRAWN AEROBIC  AND ANAEROBIC 10CC  Final   Culture   Final    NO GROWTH 5 DAYS Performed at [facility]    Report Status 05/15/2014 FINAL  Final     Studies: Ct Pelvis W Contrast  05/15/2014   CLINICAL DATA:  Reassess known right iliopsoas abscess. Previously drained. Patient continues to have fevers.  EXAM: CT PELVIS WITH CONTRAST  TECHNIQUE: Multidetector CT imaging of the pelvis was performed using the standard protocol following the bolus administration of intravenous contrast.  CONTRAST:  180mL OMNIPAQUE IOHEXOL 300 MG/ML  SOLN  COMPARISON:  05/10/2014 and 05/04/2014  FINDINGS: Right hip prosthesis causes moderate streak artifact. Again noted is patient's fluid collection within the right iliopsoas muscle/bursa at the level of the hip and extending superiorly to approximately the L5 level. This measures  approximately 4 x 4.7 cm in transverse in AP dimension at the level of the right acetabulum (previously 3.8 x 5.1 cm). No adjacent osteolysis.  Ascites unchanged. Moderate calcified plaque over the abdominal aorta and iliac arteries. Small paraumbilical hernia and small bilateral inguinal hernias unchanged. Moderate diverticulosis of the colon and moderate fecal retention over the rectum. Mild degenerate change of the left hip and moderate degenerative change of the spine. Remainder the exam is unchanged.  IMPRESSION: Continued evidence of patient's known right iliopsoas muscle/bursal abscess measuring 4 x 4.7 cm in transverse in AP dimension (previously 3.8 x 5.1 cm).  Other chronic findings as described unchanged.   Electronically Signed   By: Marin Olp M.D.   On: 05/15/2014 16:54    Scheduled Meds: . antiseptic oral rinse  7 mL Mouth Rinse BID  . cefTRIAXone (ROCEPHIN)  IV  2 g Intravenous Q24H  . diltiazem  240 mg Oral Daily  . febuxostat  80 mg Oral Daily  . feeding supplement (ENSURE ENLIVE)  237 mL Oral BID BM  . feeding supplement (PRO-STAT SUGAR FREE 64)  30 mL Per Tube BID  . fentaNYL      . folic acid  1 mg Intravenous Daily   Or  . folic acid  1 mg Oral Daily  . guaiFENesin  1,200 mg Oral BID  . hydrALAZINE  10 mg Intravenous Q4H  . insulin aspart  0-9 Units Subcutaneous Q4H  . midazolam      . multivitamin with minerals  1 tablet Oral Daily  . thiamine  100 mg Oral Daily   Or  . thiamine  100 mg Intravenous Daily    Continuous Infusions: . dextrose 5 % and 0.45% NaCl 1,000 mL with potassium chloride 40 mEq infusion 75 mL/hr at 05/16/14 0702  . feeding supplement (GLUCERNA 1.2 CAL) Stopped (05/11/14 1830)     Time spent: 25 min  [facility] Pager [phone]. If 7PM-7AM, please contact night-coverage at [email or web address], password [facility] 05/16/2014, 3:54 PM  LOS: 12 days

## 2014-05-17 DIAGNOSIS — R4182 Altered mental status, unspecified: Secondary | ICD-10-CM | POA: Insufficient documentation

## 2014-05-17 DIAGNOSIS — M009 Pyogenic arthritis, unspecified: Secondary | ICD-10-CM

## 2014-05-17 DIAGNOSIS — R404 Transient alteration of awareness: Secondary | ICD-10-CM

## 2014-05-17 DIAGNOSIS — L0291 Cutaneous abscess, unspecified: Secondary | ICD-10-CM

## 2014-05-17 LAB — GLUCOSE, CAPILLARY
GLUCOSE-CAPILLARY: 134 mg/dL — AB (ref 65–99)
GLUCOSE-CAPILLARY: 141 mg/dL — AB (ref 65–99)
GLUCOSE-CAPILLARY: 147 mg/dL — AB (ref 65–99)
GLUCOSE-CAPILLARY: 151 mg/dL — AB (ref 65–99)
GLUCOSE-CAPILLARY: 160 mg/dL — AB (ref 65–99)
Glucose-Capillary: 137 mg/dL — ABNORMAL HIGH (ref 65–99)
Glucose-Capillary: 163 mg/dL — ABNORMAL HIGH (ref 65–99)
Glucose-Capillary: 128 mg/dL — ABNORMAL HIGH (ref 65–99)

## 2014-05-17 LAB — CEA: CEA: 2.2 ng/mL (ref 0.0–4.7)

## 2014-05-17 MED ORDER — BOOST / RESOURCE BREEZE PO LIQD
1.0000 | Freq: Three times a day (TID) | ORAL | Status: DC
  Administered 2014-05-17 – 2014-05-23 (×7): 1 via ORAL

## 2014-05-17 NOTE — Progress Notes (Signed)
[facility]  Date of Admission:  05/04/2014  Antibiotics: ceftriaxone  Subjective: No acute events  Objective: Temp:  [98.2 F (36.8 C)-98.7 F (37.1 C)] 98.7 F (37.1 C) (05/12 1308) Pulse Rate:  [80-102] 88 (05/12 1308) Resp:  [16-26] 16 (05/12 1308) BP: (110-173)/(55-92) 121/70 mmHg (05/12 1308) SpO2:  [93 %-99 %] 98 % (05/12 1308) Weight:  [229 lb 11.5 oz (104.2 kg)] 229 lb 11.5 oz (104.2 kg) (05/12 0535)  General: sleeping, nad Lungs: CTA B Cor: irr irr   Lab Results Lab Results  Component Value Date   WBC 12.7* 05/16/2014   HGB 13.0 05/16/2014   HCT 40.0 05/16/2014   MCV 97.3 05/16/2014   PLT 322 05/16/2014    Lab Results  Component Value Date   CREATININE 0.67 05/16/2014   BUN 15 05/16/2014   NA 138 05/16/2014   K 4.2 05/16/2014   CL 109 05/16/2014   CO2 18* 05/16/2014    Lab Results  Component Value Date   ALT 45 05/15/2014   AST 53* 05/15/2014   ALKPHOS 111 05/15/2014   BILITOT 0.8 05/15/2014      Microbiology: Recent Results (from the past 240 hour(s))  Culture, blood (routine x 2)     Status: None   Collection Time: 05/08/14  7:00 PM  Result Value Ref Range Status   Specimen Description BLOOD RIGHT HAND  Final   Special Requests BOTTLES DRAWN AEROBIC AND ANAEROBIC 10CC  Final   Culture   Final    NO GROWTH 5 DAYS Performed at [facility]    Report Status 05/15/2014 FINAL  Final  Culture, blood (routine x 2)     Status: None   Collection Time: 05/08/14  7:15 PM  Result Value Ref Range Status   Specimen Description BLOOD RIGHT ARM  Final   Special Requests BOTTLES DRAWN AEROBIC AND ANAEROBIC 10CC  Final   Culture   Final    NO GROWTH 5 DAYS Performed at [facility]    Report Status 05/15/2014 FINAL  Final  Body fluid culture     Status: None (Preliminary result)   Collection Time: 05/16/14  6:21 PM  Result Value Ref Range Status   Specimen Description SYNOVIAL RIGHT HIP  Final   Special Requests Normal  Final   Gram Stain   Final    ABUNDANT WBC PRESENT, PREDOMINANTLY PMN NO ORGANISMS SEEN Performed at [facility]    Culture PENDING  Incomplete   Report Status PENDING  Incomplete  Body fluid culture     Status: None (Preliminary result)   Collection Time: 05/16/14  6:21 PM  Result Value Ref Range Status   Specimen Description ABSCESS RIGHT  Final   Special Requests Normal  Final   Gram Stain   Final    ABUNDANT WBC PRESENT,BOTH PMN AND MONONUCLEAR NO SQUAMOUS EPITHELIAL CELLS SEEN NO ORGANISMS SEEN Performed at [facility]    Culture PENDING  Incomplete   Report Status PENDING  Incomplete    Studies/Results: Ct Pelvis W Contrast  05/15/2014   CLINICAL DATA:  Reassess known right iliopsoas abscess. Previously drained. Patient continues to have fevers.  EXAM: CT PELVIS WITH CONTRAST  TECHNIQUE: Multidetector CT imaging of the pelvis was performed using the standard protocol following the bolus administration of intravenous contrast.  CONTRAST:  158mL OMNIPAQUE IOHEXOL 300 MG/ML  SOLN  COMPARISON:  05/10/2014 and 05/04/2014  FINDINGS: Right hip prosthesis causes moderate streak artifact. Again noted  is patient's fluid collection within the right iliopsoas muscle/bursa at the level of the hip and extending superiorly to approximately the L5 level. This measures approximately 4 x 4.7 cm in transverse in AP dimension at the level of the right acetabulum (previously 3.8 x 5.1 cm). No adjacent osteolysis.  Ascites unchanged. Moderate calcified plaque over the abdominal aorta and iliac arteries. Small paraumbilical hernia and small bilateral inguinal hernias unchanged. Moderate diverticulosis of the colon and moderate fecal retention over the rectum. Mild degenerate change of the left hip and moderate degenerative change of the spine. Remainder the exam is unchanged.  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  Other chronic findings as described unchanged.   Electronically Signed   By: Marin Olp M.D.   On: 05/15/2014 16:54   Ct Aspiration  05/16/2014   CLINICAL DATA:  79 year old male with right psoas hematoma which is suspected to be superinfected. There is also clinical concern for possible hip joint infection.  EXAM: CT IMAGE GUIDED DRAINAGE BY PERCUTANEOUS CATHETER; CT GUIDANCE NEEDLE PLACEMENT  Date: 05/16/2014  PROCEDURE: 1. Aspiration right hip joint under CT guidance 2. Placement of a 14 French drain into the right psoas hematoma/abscess Interventional Radiologist:  Criselda Peaches, MD  ANESTHESIA/SEDATION: Moderate (conscious) sedation was used. 4 mg Versed, 50 mcg Fentanyl were administered intravenously. The patient's vital signs were monitored continuously by radiology nursing throughout the procedure.  Sedation Time: 32 minutes  MEDICATIONS: None additional  TECHNIQUE: Informed consent was obtained from the patient following explanation of the procedure, risks, benefits and alternatives. The patient understands, agrees and consents for the procedure. All questions were addressed. A time out was performed.  A planning axial CT scan was performed. Suitable skin entry sites were selected and marked. The region was then sterilely prepped and draped in standard fashion using Betadine skin prep.  Attention was first turned to the hip joint aspiration. Using a far lateral approach, an 18 gauge trocar needle was advanced over the greater tuberosity and into the hip joint. CT fluoroscopic imaging was used but with limited effect secondary to the extensive streak artifact surrounding the hip prosthesis. However, the needle tip was confirmed within the joint space by the tactile feedback of metal on metal. Aspiration yielded approximately 10 mL of thick, brown purulent fluid. A sample was sent for Gram stain, cell count and culture.  The needle was removed.  Attention was next turned to the psoas collection. Local anesthesia was attained by infiltration with 1% lidocaine. Using intermittent CT fluoroscopic guidance, an 18 gauge trocar needle was advanced into the fluid collection. An Amplatz wire was then coiled within the fluid collection and the tract dilated to 12 [place]. A Cook 29 [place] all-purpose drainage catheter was then advanced over the wire and coiled within the psoas collection. Aspiration yields approximately 120 mL of thick, brown purulent fluid. The appearance of the fluid is almost identical to that which was aspirated from the hip joint suggesting a frank communication.  The drain was then flushed with sterile saline and connected to JP bulb drainage. The drain was secured to the skin with 0 Prolene suture and an adhesive fixation device. The patient tolerated the procedure well.  COMPLICATIONS: None  IMPRESSION: 1. Aspiration of right hip joint yields approximately 10 mL thick, brown purulent fluid. Grossly, this fluid is infected. Samples were sent for Gram stain, cell count  and culture. 2. Placement of a 45 French drain into the right psoas collection. Of note, the psoas collection was drained more inferiorly than previously attempted in the superior and medial aspect of the thigh rather than within the anatomic pelvis. Aspiration yields 120 mL thick, brown purulent fluid. The appearance of the fluid is identical to that aspirated from the hip joint. These results were called by telephone at the time of interpretation on 05/16/2014 at 5:00 pm to Dr. Dr. Wende Neighbors, who verbally acknowledged these results.   Electronically Signed   By: Jacqulynn Cadet M.D.   On: 05/16/2014 17:55   Ct Image Guided Drainage By Percutaneous Catheter  05/16/2014   CLINICAL DATA:  79 year old male with right psoas hematoma which is suspected to be superinfected. There is also clinical concern for possible hip joint infection.  EXAM: CT IMAGE  GUIDED DRAINAGE BY PERCUTANEOUS CATHETER; CT GUIDANCE NEEDLE PLACEMENT  Date: 05/16/2014  PROCEDURE: 1. Aspiration right hip joint under CT guidance 2. Placement of a 14 French drain into the right psoas hematoma/abscess Interventional Radiologist:  Criselda Peaches, MD  ANESTHESIA/SEDATION: Moderate (conscious) sedation was used. 4 mg Versed, 50 mcg Fentanyl were administered intravenously. The patient's vital signs were monitored continuously by radiology nursing throughout the procedure.  Sedation Time: 32 minutes  MEDICATIONS: None additional  TECHNIQUE: Informed consent was obtained from the patient following explanation of the procedure, risks, benefits and alternatives. The patient understands, agrees and consents for the procedure. All questions were addressed. A time out was performed.  A planning axial CT scan was performed. Suitable skin entry sites were selected and marked. The region was then sterilely prepped and draped in standard fashion using Betadine skin prep.  Attention was first turned to the hip joint aspiration. Using a far lateral approach, an 18 gauge trocar needle was advanced over the greater tuberosity and into the hip joint. CT fluoroscopic imaging was used but with limited effect secondary to the extensive streak artifact surrounding the hip prosthesis. However, the needle tip was confirmed within the joint space by the tactile feedback of metal on metal. Aspiration yielded approximately 10 mL of thick, brown purulent fluid. A sample was sent for Gram stain, cell count and culture. The needle was removed.  Attention was next turned to the psoas collection. Local anesthesia was attained by infiltration with 1% lidocaine. Using intermittent CT fluoroscopic guidance, an 18 gauge trocar needle was advanced into the fluid collection. An Amplatz wire was then coiled within the fluid collection and the tract dilated to 12 [place]. A Cook 17 [place] all-purpose drainage catheter was then  advanced over the wire and coiled within the psoas collection. Aspiration yields approximately 120 mL of thick, brown purulent fluid. The appearance of the fluid is almost identical to that which was aspirated from the hip joint suggesting a frank communication.  The drain was then flushed with sterile saline and connected to JP bulb drainage. The drain was secured to the skin with 0 Prolene suture and an adhesive fixation device. The patient tolerated the procedure well.  COMPLICATIONS: None  IMPRESSION: 1. Aspiration of right hip joint yields approximately 10 mL thick, brown purulent fluid. Grossly, this fluid is infected. Samples were sent for Gram stain, cell count and culture. 2. Placement of a 59 French drain into the right psoas collection. Of note, the psoas collection was drained more inferiorly than previously attempted in the superior and medial aspect of the thigh rather than within the anatomic pelvis. Aspiration  yields 120 mL thick, brown purulent fluid. The appearance of the fluid is identical to that aspirated from the hip joint. These results were called by telephone at the time of interpretation on 05/16/2014 at 5:00 pm to Dr. Dr. Wende Neighbors, who verbally acknowledged these results.   Electronically Signed   By: Jacqulynn Cadet M.D.   On: 05/16/2014 17:55    Assessment/Plan:  1) Psoas abscess - Strep in B Cx and in abscess.  On ceftriaxone. Re did drainage yesterday and now with JP and is draining out pus, 280 cc since placement.  No new positive cultures.  2) septic arthitis of prosthetic hip - Dr Berenice Primas aware of infection in hip joint.          Scharlene Gloss, [place] [facility] [place] [email or web address] [phone] pager   [phone] cell 05/17/2014, 3:40 PM

## 2014-05-17 NOTE — Progress Notes (Signed)
Pt seen.  S/P  Right psoas drain placement by Dr. Laurence Ferrari. He has no new complaints  Drainage output is thick brown purulent, about 280 mls total since placement.  Cultures still pending.  Abundant WBC present, no organisms seen.  CBC    Component Value Date/Time   WBC 12.7* 05/16/2014 0408   RBC 4.11* 05/16/2014 0408   HGB 13.0 05/16/2014 0408   HCT 40.0 05/16/2014 0408   HCT 39.4 05/11/2014 1017   PLT 322 05/16/2014 0408   MCV 97.3 05/16/2014 0408   MCH 31.6 05/16/2014 0408   MCHC 32.5 05/16/2014 0408   RDW 14.3 05/16/2014 0408   LYMPHSABS 1.1 05/05/2014 0500   MONOABS 1.3* 05/05/2014 0500   EOSABS 0.1 05/05/2014 0500   BASOSABS 0.0 05/05/2014 0500     Right drain site is intact.  Dressing is clean Pt is Awake and Alert.  Assessment:  S/P placement of psoas drain  Plan: Continue drain until OP diminishes, then obtain f/u CT scan. Check cultures.    Gareth Eagle PA-C 10-15 minutes spent evaluating pt post abscess drain.

## 2014-05-17 NOTE — Progress Notes (Addendum)
[facility] PROGRESS NOTE Interim History: 79 year old male with past medical history of chronic alcoholism, diabetes mellitus and hypertension who presented to emergency room on 4/29 because of right hip and right lower quadrant abdominal pain that had been ongoing for a week along with associated fevers and chills. CT of abdomen and pelvis noted possible iliopsoas abscess as well as new onset atrial fibrillation by EKG. Blood cultures positive for strep bovis. Iliopsoas muscle aspirated with cultures consistent with strep viridans. Infectious disease consulted for persistent fevers. Given patient's confusion, MRI done noting subdural hematoma and small CVA. Neurology consulted. Unable to place on antiplatelet therapy secondary to subdural hematoma. Initially on Cardizem drip , transitioned to by mouth and although chads 2 score at 4, because of compliance and chronic alcoholism, felt to be high risk for anticoagulation, especially in the setting of subdural. Over the course of the past 24 hours, patient has become more alert and interactive. He himself has no complaints   Assessment/Plan: Sepsis due to right iliopsoas abscess and septic arthritis: - Status post aspiration of the right iliopsoas abscess 05/06/2014 (unsucessfull) and 05/15/2014 that grew enterococcal bothers and status post right hip aspiration on 05/16/2014 with purulent materials cultures are pending - Started on empiric antibiotic on vancomycin and Zosyn Descalated to Rocephin and 05/09/2014. - Appreciate ID and orthopedics assistance. Will need antibiotics for 6 weeks. - He will need a repeated CT scan to evaluate sepsis improvement. - Repeat blood culture on 05/08/2014 remained negative.  Strep coccus group D bacteremia: - Blood cultures positive on 05/04/2014. - Continue Rocephin.  New onset atrial fibrillation: - Thought to be due to infectious etiology, started on Cardizem drip, TSH normal to the echo showed a  preserved ejection fraction with mild mitral and tricuspid regurgitation. - Heart rate has remained less than 100, he was transitioned to oral Cardizem.  Controlled diabetes mellitus type 2: Continue sliding scale insulin DC D5 half-normal saline.  Essential hypertension: - Blood pressure has been rising steadily, continue to monitor.  Alcohol abuse: He was monitored with CIWA protocol.  - His withdrawal symptoms resolve we'll continue Ativan when necessary.  Acute encephalopathy: Likely multifactorial due to subdural hematoma and infectious etiology and/or alcohol withdrawal.  - now improved. Start clear liq diet.  Subdural hematoma: - MRI of the head  05/11/2014 show subdural hematoma on the left MCA territory. - Neurology consulted and recommended no interventions at this time and will follow along.    Code Status: Full code  Family Communication: Left message with daughter  Disposition Plan: at least 7 days  Consultants:  Infectious disease  Interventional radiology  Neurology  Critical care  Orthopedics  Cardiology  General surgery  Procedures:  Attempted aspiration of iliopsoas muscle on 5/2: Unsuccessful   Antibiotics: IV vancomycin 4/29-5/6 IV Zosyn 4/29-5/4 IV Rocephin 5/4-present  HPI/Subjective:  He relates he feels better, he doesn't remember everything that he's been through.  Objective: Filed Vitals:   05/16/14 1722 05/16/14 2036 05/17/14 0015 05/17/14 0535  BP:  146/92 146/68 151/82  Pulse:  102 84 91  Temp: 98.2 F (36.8 C) 98.5 F (36.9 C)  98.3 F (36.8 C)  TempSrc: Axillary Oral  Oral  Resp:  21  19  Height:      Weight:    104.2 kg (229 lb 11.5 oz)  SpO2:  97%  98%    Intake/Output Summary (Last 24 hours) at 05/17/14 1026 Last data filed at 05/17/14 0932  Gross per 24 hour  Intake    575 ml  Output   1580 ml  Net  -1005 ml   Filed Weights   05/15/14 0400 05/16/14 0400 05/17/14 0535  Weight: 105.6 kg (232 lb 12.9  oz) 106 kg (233 lb 11 oz) 104.2 kg (229 lb 11.5 oz)    Exam:  General: Alert, awake, oriented x3, in no acute distress.  HEENT: No bruits, no goiter.  Heart: Regular rate and rhythm. Lungs: Good air movement, clear Abdomen: Soft, nontender, nondistended, positive bowel sounds.  Neuro: Grossly intact, nonfocal.   Data Reviewed: Basic Metabolic Panel:  Recent Labs Lab 05/11/14 0340 05/12/14 0355 05/13/14 0408 05/14/14 0400 05/15/14 0350 05/16/14 0408  NA 143 147* 146* 144 140 138  K 3.4* 4.1 4.0 4.4 4.1 4.2  CL 117* 120* 120* 115* 113* 109  CO2 19* 19* 19* 19* 19* 18*  GLUCOSE 167* 131* 159* 139* 142* 154*  BUN 15 20 15 14 18 15   CREATININE 0.62 0.75 0.73 0.77 0.76 0.67  CALCIUM 8.2* 8.7* 8.8* 9.1 8.9 9.1  MG 1.7 2.2  --  1.7 1.9 1.6*   Liver Function Tests:  Recent Labs Lab 05/12/14 0355 05/15/14 0350  AST 50* 53*  ALT 54 45  ALKPHOS 115 111  BILITOT 1.3* 0.8  PROT 6.6 6.4*  ALBUMIN 3.0* 2.5*   No results for input(s): LIPASE, AMYLASE in the last 168 hours. No results for input(s): AMMONIA in the last 168 hours. CBC:  Recent Labs Lab 05/12/14 0355 05/13/14 0408 05/14/14 0400 05/15/14 0350 05/16/14 0408  WBC 18.3* 13.0* 14.1* 15.1* 12.7*  HGB 13.5 12.9* 13.5 12.5* 13.0  HCT 40.2 38.6* 40.5 37.4* 40.0  MCV 97.8 97.7 98.5 97.7 97.3  PLT 397 369 377 344 322   Cardiac Enzymes: No results for input(s): CKTOTAL, CKMB, CKMBINDEX, TROPONINI in the last 168 hours. BNP (last 3 results) No results for input(s): BNP in the last 8760 hours.  ProBNP (last 3 results) No results for input(s): PROBNP in the last 8760 hours.  CBG:  Recent Labs Lab 05/16/14 1721 05/16/14 2032 05/17/14 0013 05/17/14 0540 05/17/14 0745  GLUCAP 141* 134* 137* 163* 147*    Recent Results (from the past 240 hour(s))  Culture, routine-abscess     Status: None   Collection Time: 05/07/14  2:29 PM  Result Value Ref Range Status   Specimen Description ABSCESS  Final    Special Requests Normal  Final   Gram Stain   Final    ABUNDANT WBC PRESENT,BOTH PMN AND MONONUCLEAR NO SQUAMOUS EPITHELIAL CELLS SEEN NO ORGANISMS SEEN Performed at [facility]    Culture   Final    MODERATE VIRIDANS STREPTOCOCCUS Performed at [facility]    Report Status 05/11/2014 FINAL  Final  Culture, blood (routine x 2)     Status: None   Collection Time: 05/08/14  7:00 PM  Result Value Ref Range Status   Specimen Description BLOOD RIGHT HAND  Final   Special Requests BOTTLES DRAWN AEROBIC AND ANAEROBIC 10CC  Final   Culture   Final    NO GROWTH 5 DAYS Performed at [facility]    Report Status 05/15/2014 FINAL  Final  Culture, blood (routine x 2)     Status: None   Collection Time: 05/08/14  7:15 PM  Result Value Ref Range Status   Specimen Description BLOOD RIGHT ARM  Final   Special Requests BOTTLES DRAWN AEROBIC AND ANAEROBIC 10CC  Final   Culture   Final  NO GROWTH 5 DAYS Performed at [facility]    Report Status 05/15/2014 FINAL  Final  Body fluid culture     Status: None (Preliminary result)   Collection Time: 05/16/14  6:21 PM  Result Value Ref Range Status   Specimen Description SYNOVIAL RIGHT HIP  Final   Special Requests Normal  Final   Gram Stain   Final    ABUNDANT WBC PRESENT, PREDOMINANTLY PMN NO ORGANISMS SEEN Performed at [facility]    Culture PENDING  Incomplete   Report Status PENDING  Incomplete  Body fluid culture     Status: None (Preliminary result)   Collection Time: 05/16/14  6:21 PM  Result Value Ref Range Status   Specimen Description ABSCESS RIGHT  Final   Special Requests Normal  Final   Gram Stain   Final    ABUNDANT WBC PRESENT,BOTH PMN AND MONONUCLEAR NO SQUAMOUS EPITHELIAL CELLS SEEN NO ORGANISMS SEEN Performed at [facility]    Culture PENDING  Incomplete   Report Status PENDING  Incomplete     Studies: Ct Pelvis W Contrast  05/15/2014   CLINICAL DATA:  Reassess  known right iliopsoas abscess. Previously drained. Patient continues to have fevers.  EXAM: CT PELVIS WITH CONTRAST  TECHNIQUE: Multidetector CT imaging of the pelvis was performed using the standard protocol following the bolus administration of intravenous contrast.  CONTRAST:  134mL OMNIPAQUE IOHEXOL 300 MG/ML  SOLN  COMPARISON:  05/10/2014 and 05/04/2014  FINDINGS: Right hip prosthesis causes moderate streak artifact. Again noted is patient's fluid collection within the right iliopsoas muscle/bursa at the level of the hip and extending superiorly to approximately the L5 level. This measures approximately 4 x 4.7 cm in transverse in AP dimension at the level of the right acetabulum (previously 3.8 x 5.1 cm). No adjacent osteolysis.  Ascites unchanged. Moderate calcified plaque over the abdominal aorta and iliac arteries. Small paraumbilical hernia and small bilateral inguinal hernias unchanged. Moderate diverticulosis of the colon and moderate fecal retention over the rectum. Mild degenerate change of the left hip and moderate degenerative change of the spine. Remainder the exam is unchanged.  IMPRESSION: Continued evidence of patient's known right iliopsoas muscle/bursal abscess measuring 4 x 4.7 cm in transverse in AP dimension (previously 3.8 x 5.1 cm).  Other chronic findings as described unchanged.   Electronically Signed   By: Marin Olp M.D.   On: 05/15/2014 16:54   Ct Aspiration  05/16/2014   CLINICAL DATA:  79 year old male with right psoas hematoma which is suspected to be superinfected. There is also clinical concern for possible hip joint infection.  EXAM: CT IMAGE GUIDED DRAINAGE BY PERCUTANEOUS CATHETER; CT GUIDANCE NEEDLE PLACEMENT  Date: 05/16/2014  PROCEDURE: 1. Aspiration right hip joint under CT guidance 2. Placement of a 14 French drain into the right psoas hematoma/abscess Interventional Radiologist:  Criselda Peaches, MD  ANESTHESIA/SEDATION: Moderate (conscious) sedation was used. 4  mg Versed, 50 mcg Fentanyl were administered intravenously. The patient's vital signs were monitored continuously by radiology nursing throughout the procedure.  Sedation Time: 32 minutes  MEDICATIONS: None additional  TECHNIQUE: Informed consent was obtained from the patient following explanation of the procedure, risks, benefits and alternatives. The patient understands, agrees and consents for the procedure. All questions were addressed. A time out was performed.  A planning axial CT scan was performed. Suitable skin entry sites were selected and marked. The region was then sterilely prepped and draped in standard fashion using Betadine skin prep.  Attention was first turned to the hip joint aspiration. Using a far lateral approach, an 18 gauge trocar needle was advanced over the greater tuberosity and into the hip joint. CT fluoroscopic imaging was used but with limited effect secondary to the extensive streak artifact surrounding the hip prosthesis. However, the needle tip was confirmed within the joint space by the tactile feedback of metal on metal. Aspiration yielded approximately 10 mL of thick, brown purulent fluid. A sample was sent for Gram stain, cell count and culture. The needle was removed.  Attention was next turned to the psoas collection. Local anesthesia was attained by infiltration with 1% lidocaine. Using intermittent CT fluoroscopic guidance, an 18 gauge trocar needle was advanced into the fluid collection. An Amplatz wire was then coiled within the fluid collection and the tract dilated to 12 [place]. A Cook 16 [place] all-purpose drainage catheter was then advanced over the wire and coiled within the psoas collection. Aspiration yields approximately 120 mL of thick, brown purulent fluid. The appearance of the fluid is almost identical to that which was aspirated from the hip joint suggesting a frank communication.  The drain was then flushed with sterile saline and connected to JP bulb  drainage. The drain was secured to the skin with 0 Prolene suture and an adhesive fixation device. The patient tolerated the procedure well.  COMPLICATIONS: None  IMPRESSION: 1. Aspiration of right hip joint yields approximately 10 mL thick, brown purulent fluid. Grossly, this fluid is infected. Samples were sent for Gram stain, cell count and culture. 2. Placement of a 20 French drain into the right psoas collection. Of note, the psoas collection was drained more inferiorly than previously attempted in the superior and medial aspect of the thigh rather than within the anatomic pelvis. Aspiration yields 120 mL thick, brown purulent fluid. The appearance of the fluid is identical to that aspirated from the hip joint. These results were called by telephone at the time of interpretation on 05/16/2014 at 5:00 pm to Dr. Dr. Wende Neighbors, who verbally acknowledged these results.   Electronically Signed   By: Jacqulynn Cadet M.D.   On: 05/16/2014 17:55   Ct Image Guided Drainage By Percutaneous Catheter  05/16/2014   CLINICAL DATA:  79 year old male with right psoas hematoma which is suspected to be superinfected. There is also clinical concern for possible hip joint infection.  EXAM: CT IMAGE GUIDED DRAINAGE BY PERCUTANEOUS CATHETER; CT GUIDANCE NEEDLE PLACEMENT  Date: 05/16/2014  PROCEDURE: 1. Aspiration right hip joint under CT guidance 2. Placement of a 14 French drain into the right psoas hematoma/abscess Interventional Radiologist:  Criselda Peaches, MD  ANESTHESIA/SEDATION: Moderate (conscious) sedation was used. 4 mg Versed, 50 mcg Fentanyl were administered intravenously. The patient's vital signs were monitored continuously by radiology nursing throughout the procedure.  Sedation Time: 32 minutes  MEDICATIONS: None additional  TECHNIQUE: Informed consent was obtained from the patient following explanation of the procedure, risks, benefits and alternatives. The patient understands, agrees and consents for the  procedure. All questions were addressed. A time out was performed.  A planning axial CT scan was performed. Suitable skin entry sites were selected and marked. The region was then sterilely prepped and draped in standard fashion using Betadine skin prep.  Attention was first turned to the hip joint aspiration. Using a far lateral approach, an 18 gauge trocar needle was advanced over the greater tuberosity and into the hip joint. CT fluoroscopic imaging was used but with limited effect secondary to the  extensive streak artifact surrounding the hip prosthesis. However, the needle tip was confirmed within the joint space by the tactile feedback of metal on metal. Aspiration yielded approximately 10 mL of thick, brown purulent fluid. A sample was sent for Gram stain, cell count and culture. The needle was removed.  Attention was next turned to the psoas collection. Local anesthesia was attained by infiltration with 1% lidocaine. Using intermittent CT fluoroscopic guidance, an 18 gauge trocar needle was advanced into the fluid collection. An Amplatz wire was then coiled within the fluid collection and the tract dilated to 12 [place]. A Cook 36 [place] all-purpose drainage catheter was then advanced over the wire and coiled within the psoas collection. Aspiration yields approximately 120 mL of thick, brown purulent fluid. The appearance of the fluid is almost identical to that which was aspirated from the hip joint suggesting a frank communication.  The drain was then flushed with sterile saline and connected to JP bulb drainage. The drain was secured to the skin with 0 Prolene suture and an adhesive fixation device. The patient tolerated the procedure well.  COMPLICATIONS: None  IMPRESSION: 1. Aspiration of right hip joint yields approximately 10 mL thick, brown purulent fluid. Grossly, this fluid is infected. Samples were sent for Gram stain, cell count and culture. 2. Placement of a 42 French drain into the right psoas  collection. Of note, the psoas collection was drained more inferiorly than previously attempted in the superior and medial aspect of the thigh rather than within the anatomic pelvis. Aspiration yields 120 mL thick, brown purulent fluid. The appearance of the fluid is identical to that aspirated from the hip joint. These results were called by telephone at the time of interpretation on 05/16/2014 at 5:00 pm to Dr. Dr. Wende Neighbors, who verbally acknowledged these results.   Electronically Signed   By: Jacqulynn Cadet M.D.   On: 05/16/2014 17:55    Scheduled Meds: . antiseptic oral rinse  7 mL Mouth Rinse BID  . cefTRIAXone (ROCEPHIN)  IV  2 g Intravenous Q24H  . diltiazem  240 mg Oral Daily  . febuxostat  80 mg Oral Daily  . feeding supplement (ENSURE ENLIVE)  237 mL Oral BID BM  . feeding supplement (PRO-STAT SUGAR FREE 64)  30 mL Per Tube BID  . folic acid  1 mg Intravenous Daily   Or  . folic acid  1 mg Oral Daily  . guaiFENesin  1,200 mg Oral BID  . hydrALAZINE  10 mg Intravenous Q4H  . insulin aspart  0-9 Units Subcutaneous Q4H  . multivitamin with minerals  1 tablet Oral Daily  . thiamine  100 mg Oral Daily   Or  . thiamine  100 mg Intravenous Daily   Continuous Infusions: . dextrose 5 % and 0.45% NaCl 1,000 mL with potassium chloride 40 mEq infusion 75 mL/hr at 05/16/14 0702  . feeding supplement (GLUCERNA 1.2 CAL) Stopped (05/11/14 1830)    Time Spent: 25 min   FELIZ Marguarite Arbour  [facility] Pager [phone]. If 7PM-7AM, please contact night-coverage at [email or web address], password [facility] 05/17/2014, 10:26 AM  LOS: 13 days

## 2014-05-17 NOTE — Progress Notes (Signed)
Nutrition Follow-up  DOCUMENTATION CODES:  Obesity unspecified  INTERVENTION:  Boost Breeze TID while patient is on clear liquids. When diet is advanced past clears, recommend Ensure Enlive po BID, each supplement provides 350 kcal and 20 grams of protein Encourage PO intake RD to continue to monitor  NUTRITION DIAGNOSIS:  Inadequate oral intake related to inability to eat as evidenced by  (now CL diet).  Ongoing.  GOAL:  Patient will meet greater than or equal to 90% of their needs  unmet  MONITOR:  PO intake, Supplement acceptance, Labs, Weight trends, Skin, I & O's  REASON FOR ASSESSMENT:  Consult Enteral/tube feeding initiation and management  ASSESSMENT: 79 y.o. male with history of diabetes mellitus type 2, chronic alcoholism, hypertension, gout presents to the ER because of worsening pain in the right hip and right lower quadrant.  5/3: - Pt s/p CT guided aspiration of left psoas hematoma 5/2 - Ongoing encephalopathy, pt currently NPO. Nutritional history obtained from RN and pt medical chart.  - No weight history.   5/6: - Consult received for TF initiation and management. Panda tube placed 5/6. - RD to order TF. Pt with history of DM.  - Will monitor for TF tolerance. - Pt is at risk for re-feeding syndrome given that he has been NPO for 8 days and has a history of ETOH abuse.   5/9: -RD consulted for nutritional assessment.  -Pt pulled out panda tube 5/7.  -SLP evaluation showed no s/s of aspiration. States that his mental status is his biggest barrier to PO intake. Recommend Dysphagia 1 diet with thin liquids. -Unable to obtain history from pt d/t confusion. -PT would benefit from nutritional supplementation d/t malnutrition status and ETOH abuse history.  5/12: -Pt's mental status has improved. RD was able to speak with patient. -Pt reports feeling hungry. -Pt with clear liquid tray, untouched. RD assisted pt with opening containers.  Provided pt with Resource Breeze supplement. -Pt would like Ensure supplements when diet is advanced.   Labs and medications reviewed.  Height:  Ht Readings from Last 1 Encounters:  05/04/14 5\' 7"  (1.702 m)    Weight:  Wt Readings from Last 1 Encounters:  05/17/14 229 lb 11.5 oz (104.2 kg)    Ideal Body Weight:  61.4 kg  Wt Readings from Last 10 Encounters:  05/17/14 229 lb 11.5 oz (104.2 kg)  05/11/14 222 lb (100.699 kg)    BMI:  Body mass index is 35.97 kg/(m^2).  Estimated Nutritional Needs:  Kcal:  2100-2300  Protein:  120-130 g  Fluid:  2.1 L/day    Skin:  Wound (see comment) (hip incision, skin tear, MSAD)  Diet Order:  Diet clear liquid Room service appropriate?: Yes; Fluid consistency:: Thin  EDUCATION NEEDS:  Education needs no appropriate at this time   Intake/Output Summary (Last 24 hours) at 05/17/14 1147 Last data filed at 05/17/14 0932  Gross per 24 hour  Intake    500 ml  Output   1255 ml  Net   -755 ml    Last BM:  5/12  Clayton Bibles, MS, RD, LDN Pager: [phone] After Hours Pager: [phone]

## 2014-05-17 NOTE — Progress Notes (Signed)
Subjective: Patient was much more alert today.  He was certainly not completely oriented but for above where he had been in the past.   Objective: Vital signs in last 24 hours: Temp:  [98.3 F (36.8 C)-98.7 F (37.1 C)] 98.7 F (37.1 C) (05/12 1308) Pulse Rate:  [84-91] 88 (05/12 1308) Resp:  [16-19] 16 (05/12 1308) BP: (110-151)/(54-86) 130/66 mmHg (05/12 1732) SpO2:  [98 %] 98 % (05/12 1308) Weight:  [229 lb 11.5 oz (104.2 kg)] 229 lb 11.5 oz (104.2 kg) (05/12 0535)  Intake/Output from previous day: 05/11 0701 - 05/12 0700 In: 875 [I.V.:825; IV Piggyback:50] Out: 0626 [Urine:1300; Drains:280] Intake/Output this shift:     Recent Labs  05/15/14 0350 05/16/14 0408  HGB 12.5* 13.0    Recent Labs  05/15/14 0350 05/16/14 0408  WBC 15.1* 12.7*  RBC 3.83* 4.11*  HCT 37.4* 40.0  PLT 344 322    Recent Labs  05/15/14 0350 05/16/14 0408  NA 140 138  K 4.1 4.2  CL 113* 109  CO2 19* 18*  BUN 18 15  CREATININE 0.76 0.67  GLUCOSE 142* 154*  CALCIUM 8.9 9.1   No results for input(s): LABPT, INR in the last 72 hours.  Neurologically intact patient complained of pain in both knees..  There is no soft tissue swelling, erythema, or effusion.   He complained of minimal hip pain.  Assessment/Plan: 79 year old male with history of having had an ASR  Hip replacement many years ago.  He unfortunately this point has infection in the air the right hip.  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  He currently has a drain in place which is draining quite nicely.  At this point I would like to continue to allow this to drain and see how he does with continued IV antibiotic therapy.  Likely he will need his hip parts pulled and antibiotic impregnated cement spacer placed.  I think may do this with a cemented Prostalac polyethylene cup and changing out the stem to  allow the patient to be able to ambulate and perhaps survive with a 1 stage type procedure.  I have put him on the schedule for Monday afternoon currently.  I had a long talk with his daughter today.  She would like to have him involved in the decision-making and as such we will obviously need to hold off until he is able to participate in decision making.   Siona Coulston L 05/17/2014, 8:42 PM

## 2014-05-17 NOTE — Progress Notes (Signed)
Subjective: Resting in bed. Awakens to noxious stimuli and very agitated.  He will quickly fall back to sleep. Desires not to take part in exam and pushed examiner away  Exam: Filed Vitals:   05/17/14 0535  BP: 151/82  Pulse: 91  Temp: 98.3 F (36.8 C)  Resp: 19    HEENT-  Normocephalic, no lesions, without obvious abnormality.  Normal external eye and conjunctiva.  Normal TM's bilaterally.  Normal auditory canals and external ears. Normal external nose, mucus membranes and septum.  Normal pharynx. Cardiovascular- regular rate and rhythm, pulses palpable throughout   Lungs- chest clear, no wheezing, rales, normal symmetric air entry Abdomen- normal findings: bowel sounds normal Extremities- no edema Lymph-no adenopathy palpable Musculoskeletal-no joint tenderness, deformity or swelling Skin-warm and dry, no hyperpigmentation, vitiligo, or suspicious lesions    Gen: In bed, NAD MS: Alert, will not tell me name, place, year or month, speech clear with no aphasia.  CN: EOMI, TML, FACE equal and symmetrical, would not protrude tongue Motor: moving all extremities antigravity but right LE he would not move due to pain.  Sensory:intact throughout   Pertinent Labs: No new labs    Impression: Altered mental status with encephalopathy secondary to multiple factors, including sepsis, subdural hematoma and left MCA territory stroke. Patient's mental statusshowed improvement significantly over the past 48 hours, however today he is not desiring to take part in exam.  He shows no focal abnormal findings. HE is drowsy but awakens to noxious stimuli.  Recommend making no changes in current management. We will continue to follow this patient with you.   05/17/2014, 8:53 AM Etta Quill PA-C [facility] [phone]

## 2014-05-18 ENCOUNTER — Inpatient Hospital Stay (HOSPITAL_COMMUNITY): Payer: Medicare Other

## 2014-05-18 DIAGNOSIS — B9689 Other specified bacterial agents as the cause of diseases classified elsewhere: Secondary | ICD-10-CM

## 2014-05-18 DIAGNOSIS — M009 Pyogenic arthritis, unspecified: Secondary | ICD-10-CM

## 2014-05-18 DIAGNOSIS — M00851 Arthritis due to other bacteria, right hip: Secondary | ICD-10-CM

## 2014-05-18 DIAGNOSIS — T8451XA Infection and inflammatory reaction due to internal right hip prosthesis, initial encounter: Principal | ICD-10-CM

## 2014-05-18 HISTORY — DX: Pyogenic arthritis, unspecified: M00.9

## 2014-05-18 LAB — GLUCOSE, CAPILLARY
GLUCOSE-CAPILLARY: 135 mg/dL — AB (ref 65–99)
GLUCOSE-CAPILLARY: 141 mg/dL — AB (ref 65–99)
GLUCOSE-CAPILLARY: 143 mg/dL — AB (ref 65–99)
Glucose-Capillary: 145 mg/dL — ABNORMAL HIGH (ref 65–99)
Glucose-Capillary: 125 mg/dL — ABNORMAL HIGH (ref 65–99)
Glucose-Capillary: 164 mg/dL — ABNORMAL HIGH (ref 65–99)
Glucose-Capillary: 132 mg/dL — ABNORMAL HIGH (ref 65–99)

## 2014-05-18 MED ORDER — POLYETHYLENE GLYCOL 3350 17 G PO PACK
17.0000 g | PACK | Freq: Every day | ORAL | Status: DC
  Administered 2014-05-18 – 2014-05-24 (×6): 17 g via ORAL
  Filled 2014-05-18 (×8): qty 1

## 2014-05-18 MED ORDER — SODIUM CHLORIDE 0.9 % IJ SOLN: 10.0000 mL | INTRAMUSCULAR | Status: DC | PRN

## 2014-05-18 MED ORDER — SODIUM CHLORIDE 0.9 % IJ SOLN
10.0000 mL | INTRAMUSCULAR | Status: DC | PRN
  Administered 2014-05-19 – 2014-05-24 (×3): 10 mL
  Filled 2014-05-18 (×3): qty 40

## 2014-05-18 NOTE — Progress Notes (Signed)
Peripherally Inserted Central Catheter/Midline Placement  The IV Nurse has discussed with the patient and/or persons authorized to consent for the patient, the purpose of this procedure and the potential benefits and risks involved with this procedure.  The benefits include less needle sticks, lab draws from the catheter and patient may be discharged home with the catheter.  Risks include, but not limited to, infection, bleeding, blood clot (thrombus formation), and puncture of an artery; nerve damage and irregular heat beat.  Alternatives to this procedure were also discussed.  PICC/Midline Placement Documentation  PICC / Midline Single Lumen 16/10/96 PICC Right Basilic 43 cm 0 cm (Active)  Exposed Catheter (cm) 1 cm 05/18/2014  6:00 PM  Site Assessment Ecchymotic 05/18/2014  6:00 PM  Dressing Change Due 05/25/14 05/18/2014  6:00 PM       Samantha Ragen, Maricela Bo 05/18/2014, 6:23 PM

## 2014-05-18 NOTE — Progress Notes (Signed)
Patient ID: [name], male   DOB: 1935/12/02, 79 y.o.   MRN: [id number]    Referring Physician(s): [facility]  Subjective:  Pt s/p placement of right psoas drain. He is doing well. No new complaints.    Allergies: Allopurinol and Motrin  Medications: Prior to Admission medications   Medication Sig Start Date End Date Taking? Authorizing Provider  atenolol (TENORMIN) 50 MG tablet Take 50 mg by mouth daily. 04/16/14  Yes Historical Provider, MD  celecoxib (CELEBREX) 200 MG capsule Take 200 mg by mouth 2 (two) times daily. 02/26/14  Yes Historical Provider, MD  CRESTOR 10 MG tablet Take 10 mg by mouth daily. Takes one every other day 03/05/14  Yes Historical Provider, MD  furosemide (LASIX) 20 MG tablet Take 20 mg by mouth daily. 02/21/14  Yes Historical Provider, MD  LYRICA 75 MG capsule Take 75 mg by mouth 2 (two) times daily. 75mg  in the morning and 150mg  at night 04/27/14  Yes Historical Provider, MD  metFORMIN (GLUCOPHAGE) 500 MG tablet Take 1,000 mg by mouth. 02/24/14  Yes Historical Provider, MD  traMADol (ULTRAM) 50 MG tablet Take 100 mg by mouth. Every 4 hours 04/10/14  Yes Historical Provider, MD  ULORIC 80 MG TABS Take 1 tablet by mouth daily. 02/22/14  Yes Historical Provider, MD   Cultures still pending.  Vital Signs: BP 144/76 mmHg  Pulse 94  Temp(Src) 98.5 F (36.9 C) (Oral)  Resp 16  Ht 5\' 7"  (1.702 m)  Wt 231 lb 11.3 oz (105.1 kg)  BMI 36.28 kg/m2  SpO2 98%  Physical Exam   Alert. NAD Right thigh drain in place. Intact. Appears to be draining well. About 25 Mls output overnight. No erythema.   Imaging: Ct Pelvis W Contrast  05/15/2014   CLINICAL DATA:  Reassess known right iliopsoas abscess. Previously drained. Patient continues to have fevers.  EXAM: CT PELVIS WITH CONTRAST  TECHNIQUE: Multidetector CT imaging of the pelvis was performed using the standard protocol following the bolus administration of intravenous contrast.  CONTRAST:  159mL OMNIPAQUE IOHEXOL 300  MG/ML  SOLN  COMPARISON:  05/10/2014 and 05/04/2014  FINDINGS: Right hip prosthesis causes moderate streak artifact. Again noted is patient's fluid collection within the right iliopsoas muscle/bursa at the level of the hip and extending superiorly to approximately the L5 level. This measures approximately 4 x 4.7 cm in transverse in AP dimension at the level of the right acetabulum (previously 3.8 x 5.1 cm). No adjacent osteolysis.  Ascites unchanged. Moderate calcified plaque over the abdominal aorta and iliac arteries. Small paraumbilical hernia and small bilateral inguinal hernias unchanged. Moderate diverticulosis of the colon and moderate fecal retention over the rectum. Mild degenerate change of the left hip and moderate degenerative change of the spine. Remainder the exam is unchanged.  IMPRESSION: Continued evidence of patient's known right iliopsoas muscle/bursal abscess measuring 4 x 4.7 cm in transverse in AP dimension (previously 3.8 x 5.1 cm).  Other chronic findings as described unchanged.   Electronically Signed   By: Marin Olp M.D.   On: 05/15/2014 16:54   Ct Aspiration  05/16/2014   CLINICAL DATA:  79 year old male with right psoas hematoma which is suspected to be superinfected. There is also clinical concern for possible hip joint infection.  EXAM: CT IMAGE GUIDED DRAINAGE BY PERCUTANEOUS CATHETER; CT GUIDANCE NEEDLE PLACEMENT  Date: 05/16/2014  PROCEDURE: 1. Aspiration right hip joint under CT guidance 2. Placement of a 14 French drain into the right psoas hematoma/abscess Interventional Radiologist:  Criselda Peaches, MD  ANESTHESIA/SEDATION: Moderate (conscious) sedation was used. 4 mg Versed, 50 mcg Fentanyl were administered intravenously. The patient's vital signs were monitored continuously by radiology nursing throughout the procedure.  Sedation Time: 32 minutes  MEDICATIONS: None additional  TECHNIQUE: Informed consent was obtained from the patient following explanation of the  procedure, risks, benefits and alternatives. The patient understands, agrees and consents for the procedure. All questions were addressed. A time out was performed.  A planning axial CT scan was performed. Suitable skin entry sites were selected and marked. The region was then sterilely prepped and draped in standard fashion using Betadine skin prep.  Attention was first turned to the hip joint aspiration. Using a far lateral approach, an 18 gauge trocar needle was advanced over the greater tuberosity and into the hip joint. CT fluoroscopic imaging was used but with limited effect secondary to the extensive streak artifact surrounding the hip prosthesis. However, the needle tip was confirmed within the joint space by the tactile feedback of metal on metal. Aspiration yielded approximately 10 mL of thick, brown purulent fluid. A sample was sent for Gram stain, cell count and culture. The needle was removed.  Attention was next turned to the psoas collection. Local anesthesia was attained by infiltration with 1% lidocaine. Using intermittent CT fluoroscopic guidance, an 18 gauge trocar needle was advanced into the fluid collection. An Amplatz wire was then coiled within the fluid collection and the tract dilated to 12 [place]. A Cook 41 [place] all-purpose drainage catheter was then advanced over the wire and coiled within the psoas collection. Aspiration yields approximately 120 mL of thick, brown purulent fluid. The appearance of the fluid is almost identical to that which was aspirated from the hip joint suggesting a frank communication.  The drain was then flushed with sterile saline and connected to JP bulb drainage. The drain was secured to the skin with 0 Prolene suture and an adhesive fixation device. The patient tolerated the procedure well.  COMPLICATIONS: None  IMPRESSION: 1. Aspiration of right hip joint yields approximately 10 mL thick, brown purulent fluid. Grossly, this fluid is infected. Samples were  sent for Gram stain, cell count and culture. 2. Placement of a 57 French drain into the right psoas collection. Of note, the psoas collection was drained more inferiorly than previously attempted in the superior and medial aspect of the thigh rather than within the anatomic pelvis. Aspiration yields 120 mL thick, brown purulent fluid. The appearance of the fluid is identical to that aspirated from the hip joint. These results were called by telephone at the time of interpretation on 05/16/2014 at 5:00 pm to Dr. Dr. Wende Neighbors, who verbally acknowledged these results.   Electronically Signed   By: Jacqulynn Cadet M.D.   On: 05/16/2014 17:55   Ct Image Guided Drainage By Percutaneous Catheter  05/16/2014   CLINICAL DATA:  79 year old male with right psoas hematoma which is suspected to be superinfected. There is also clinical concern for possible hip joint infection.  EXAM: CT IMAGE GUIDED DRAINAGE BY PERCUTANEOUS CATHETER; CT GUIDANCE NEEDLE PLACEMENT  Date: 05/16/2014  PROCEDURE: 1. Aspiration right hip joint under CT guidance 2. Placement of a 14 French drain into the right psoas hematoma/abscess Interventional Radiologist:  Criselda Peaches, MD  ANESTHESIA/SEDATION: Moderate (conscious) sedation was used. 4 mg Versed, 50 mcg Fentanyl were administered intravenously. The patient's vital signs were monitored continuously by radiology nursing throughout the procedure.  Sedation Time: 32 minutes  MEDICATIONS: None additional  TECHNIQUE: Informed consent was obtained from the patient following explanation of the procedure, risks, benefits and alternatives. The patient understands, agrees and consents for the procedure. All questions were addressed. A time out was performed.  A planning axial CT scan was performed. Suitable skin entry sites were selected and marked. The region was then sterilely prepped and draped in standard fashion using Betadine skin prep.  Attention was first turned to the hip joint aspiration.  Using a far lateral approach, an 18 gauge trocar needle was advanced over the greater tuberosity and into the hip joint. CT fluoroscopic imaging was used but with limited effect secondary to the extensive streak artifact surrounding the hip prosthesis. However, the needle tip was confirmed within the joint space by the tactile feedback of metal on metal. Aspiration yielded approximately 10 mL of thick, brown purulent fluid. A sample was sent for Gram stain, cell count and culture. The needle was removed.  Attention was next turned to the psoas collection. Local anesthesia was attained by infiltration with 1% lidocaine. Using intermittent CT fluoroscopic guidance, an 18 gauge trocar needle was advanced into the fluid collection. An Amplatz wire was then coiled within the fluid collection and the tract dilated to 12 [place]. A Cook 90 [place] all-purpose drainage catheter was then advanced over the wire and coiled within the psoas collection. Aspiration yields approximately 120 mL of thick, brown purulent fluid. The appearance of the fluid is almost identical to that which was aspirated from the hip joint suggesting a frank communication.  The drain was then flushed with sterile saline and connected to JP bulb drainage. The drain was secured to the skin with 0 Prolene suture and an adhesive fixation device. The patient tolerated the procedure well.  COMPLICATIONS: None  IMPRESSION: 1. Aspiration of right hip joint yields approximately 10 mL thick, brown purulent fluid. Grossly, this fluid is infected. Samples were sent for Gram stain, cell count and culture. 2. Placement of a 64 French drain into the right psoas collection. Of note, the psoas collection was drained more inferiorly than previously attempted in the superior and medial aspect of the thigh rather than within the anatomic pelvis. Aspiration yields 120 mL thick, brown purulent fluid. The appearance of the fluid is identical to that aspirated from the hip  joint. These results were called by telephone at the time of interpretation on 05/16/2014 at 5:00 pm to Dr. Dr. Wende Neighbors, who verbally acknowledged these results.   Electronically Signed   By: Jacqulynn Cadet M.D.   On: 05/16/2014 17:55    Labs:  CBC:  Recent Labs  05/13/14 0408 05/14/14 0400 05/15/14 0350 05/16/14 0408  WBC 13.0* 14.1* 15.1* 12.7*  HGB 12.9* 13.5 12.5* 13.0  HCT 38.6* 40.5 37.4* 40.0  PLT 369 377 344 322    COAGS:  Recent Labs  05/05/14 0055  INR 1.18  APTT 32    BMP:  Recent Labs  05/13/14 0408 05/14/14 0400 05/15/14 0350 05/16/14 0408  NA 146* 144 140 138  K 4.0 4.4 4.1 4.2  CL 120* 115* 113* 109  CO2 19* 19* 19* 18*  GLUCOSE 159* 139* 142* 154*  BUN 15 14 18 15   CALCIUM 8.8* 9.1 8.9 9.1  CREATININE 0.73 0.77 0.76 0.67  GFRNONAA >60 >60 >60 >60  GFRAA >60 >60 >60 >60    LIVER FUNCTION TESTS:  Recent Labs  05/04/14 1500 05/05/14 0500 05/12/14 0355 05/15/14 0350  BILITOT 1.6* 1.8* 1.3* 0.8  AST 152* 167* 50* 53*  ALT 92* 111* 54 45  ALKPHOS 111 120* 115 111  PROT 7.4 7.4 6.6 6.4*  ALBUMIN 3.6 3.5 3.0* 2.5*    Assessment and Plan:  S/P Placement of drain for psoas abscess.  Continue drain to bulb suction, continue flushing drain. Await cultures.  SignedNevin Bloodgood 05/18/2014, 9:27 AM   I spent a total of 15 in face to face in clinical consultation/evaluation, greater than 50% of which was counseling/coordinating care for psoas drain.

## 2014-05-18 NOTE — Progress Notes (Signed)
[facility] PROGRESS NOTE Interim History: 79 year old male with past medical history of chronic alcoholism, diabetes mellitus and hypertension who presented to emergency room on 4/29 because of right hip and right lower quadrant abdominal pain that had been ongoing for a week along with associated fevers and chills. CT of abdomen and pelvis noted possible iliopsoas abscess as well as new onset atrial fibrillation by EKG. Blood cultures positive for strep bovis. Iliopsoas muscle aspirated with cultures consistent with strep viridans. Infectious disease consulted for persistent fevers. Given patient's confusion, MRI done noting subdural hematoma and small CVA. Neurology consulted. Unable to place on antiplatelet therapy secondary to subdural hematoma. Initially on Cardizem drip , transitioned to by mouth and although chads 2 score at 4, because of compliance and chronic alcoholism, felt to be high risk for anticoagulation, especially in the setting of subdural. Over the course of the past 24 hours, patient has become more alert and interactive. He himself has no complaints   Assessment/Plan: Sepsis due to right iliopsoas abscess and septic arthritis: - Unsuccessful drain placement of the right iliopsoas abscess 05/06/2014  and successfully repeated on 05/15/2014 that grew enterococcal. Cont to drain purulent material.  He will need a repeated CT scan to evaluate abscess before taking drain out. - Status post right hip aspiration on 05/16/2014 with purulent materials cultures are pending - Started on empiric antibiotic on vancomycin and Zosyn Descalated  to Rocephin and 05/09/2014. - Will need antibiotics for 6 weeks. - Repeat blood culture on 05/08/2014 remained negative. - orthopedics will like to take him to the OR on 5.16.2016  For antibiotic impregnated cement spacer.  Strep coccus group D bacteremia: - Blood cultures positive on 05/04/2014. - Continue Rocephin.  New onset atrial  fibrillation: - Thought to be due to infectious etiology, started on Cardizem drip, TSH normal to the echo showed a preserved ejection fraction with mild mitral and tricuspid regurgitation. - Heart rate has remained less than 100, he was transitioned to oral Cardizem. - not a candidate for anticoagulation due to bilateral subdural hematoma.  Controlled diabetes mellitus type 2: Continue sliding scale insulin DC D5 half-normal saline.  Essential hypertension: - Blood pressure has been rising steadily, continue to monitor.  Alcohol abuse: He was monitored with CIWA protocol.  - His withdrawal symptoms resolve we'll continue Ativan when necessary.  Acute encephalopathy: - Likely multifactorial due to subdural hematoma and infectious etiology and/or alcohol withdrawal.  - now improved. Start clear liq diet.  Subdural hematoma: - MRI of the head  05/11/2014 show subdural hematoma on the left MCA territory. - Neurology consulted and recommended no interventions at this time and will follow along.    Code Status: Full code  Family Communication: Left message with daughter  Disposition Plan: at least 7 days  Consultants:  Infectious disease  Interventional radiology  Neurology  Critical care  Orthopedics  Cardiology  General surgery  Procedures:  Attempted aspiration of iliopsoas muscle on 5/2: Unsuccessful   Antibiotics: IV vancomycin 4/29-5/6 IV Zosyn 4/29-5/4 IV Rocephin 5/4-present  HPI/Subjective:  He relates he feels better, still thinking about procedure.  Objective: Filed Vitals:   05/17/14 2047 05/18/14 0008 05/18/14 0329 05/18/14 1050  BP: 146/77 151/83 144/76 135/70  Pulse: 98 100 94   Temp: 98.2 F (36.8 C)  98.5 F (36.9 C)   TempSrc: Oral  Oral   Resp: 18  16   Height:      Weight:   105.1 kg (231 lb 11.3 oz)  SpO2: 97%  98%     Intake/Output Summary (Last 24 hours) at 05/18/14 1051 Last data filed at 05/18/14 0900  Gross per 24  hour  Intake    170 ml  Output   1805 ml  Net  -1635 ml   Filed Weights   05/16/14 0400 05/17/14 0535 05/18/14 0329  Weight: 106 kg (233 lb 11 oz) 104.2 kg (229 lb 11.5 oz) 105.1 kg (231 lb 11.3 oz)    Exam:  General: Alert, awake, oriented x3, in no acute distress.  HEENT: No bruits, no goiter.  Heart: Regular rate and rhythm. Lungs: Good air movement, clear Abdomen: Soft, nontender, nondistended, positive bowel sounds.  Neuro: Grossly intact, nonfocal.   Data Reviewed: Basic Metabolic Panel:  Recent Labs Lab 05/12/14 0355 05/13/14 0408 05/14/14 0400 05/15/14 0350 05/16/14 0408  NA 147* 146* 144 140 138  K 4.1 4.0 4.4 4.1 4.2  CL 120* 120* 115* 113* 109  CO2 19* 19* 19* 19* 18*  GLUCOSE 131* 159* 139* 142* 154*  BUN 20 15 14 18 15   CREATININE 0.75 0.73 0.77 0.76 0.67  CALCIUM 8.7* 8.8* 9.1 8.9 9.1  MG 2.2  --  1.7 1.9 1.6*   Liver Function Tests:  Recent Labs Lab 05/12/14 0355 05/15/14 0350  AST 50* 53*  ALT 54 45  ALKPHOS 115 111  BILITOT 1.3* 0.8  PROT 6.6 6.4*  ALBUMIN 3.0* 2.5*   No results for input(s): LIPASE, AMYLASE in the last 168 hours. No results for input(s): AMMONIA in the last 168 hours. CBC:  Recent Labs Lab 05/12/14 0355 05/13/14 0408 05/14/14 0400 05/15/14 0350 05/16/14 0408  WBC 18.3* 13.0* 14.1* 15.1* 12.7*  HGB 13.5 12.9* 13.5 12.5* 13.0  HCT 40.2 38.6* 40.5 37.4* 40.0  MCV 97.8 97.7 98.5 97.7 97.3  PLT 397 369 377 344 322   Cardiac Enzymes: No results for input(s): CKTOTAL, CKMB, CKMBINDEX, TROPONINI in the last 168 hours. BNP (last 3 results) No results for input(s): BNP in the last 8760 hours.  ProBNP (last 3 results) No results for input(s): PROBNP in the last 8760 hours.  CBG:  Recent Labs Lab 05/17/14 1709 05/17/14 2050 05/18/14 0006 05/18/14 0323 05/18/14 0757  GLUCAP 160* 135* 143* 145* 125*    Recent Results (from the past 240 hour(s))  Culture, blood (routine x 2)     Status: None   Collection  Time: 05/08/14  7:00 PM  Result Value Ref Range Status   Specimen Description BLOOD RIGHT HAND  Final   Special Requests BOTTLES DRAWN AEROBIC AND ANAEROBIC 10CC  Final   Culture   Final    NO GROWTH 5 DAYS Performed at [facility]    Report Status 05/15/2014 FINAL  Final  Culture, blood (routine x 2)     Status: None   Collection Time: 05/08/14  7:15 PM  Result Value Ref Range Status   Specimen Description BLOOD RIGHT ARM  Final   Special Requests BOTTLES DRAWN AEROBIC AND ANAEROBIC 10CC  Final   Culture   Final    NO GROWTH 5 DAYS Performed at [facility]    Report Status 05/15/2014 FINAL  Final  Body fluid culture     Status: None (Preliminary result)   Collection Time: 05/16/14  6:21 PM  Result Value Ref Range Status   Specimen Description SYNOVIAL RIGHT HIP  Final   Special Requests Normal  Final   Gram Stain   Final    ABUNDANT WBC PRESENT,  PREDOMINANTLY PMN NO ORGANISMS SEEN Performed at [facility]    Culture PENDING  Incomplete   Report Status PENDING  Incomplete  Body fluid culture     Status: None (Preliminary result)   Collection Time: 05/16/14  6:21 PM  Result Value Ref Range Status   Specimen Description ABSCESS RIGHT  Final   Special Requests Normal  Final   Gram Stain   Final    ABUNDANT WBC PRESENT,BOTH PMN AND MONONUCLEAR NO SQUAMOUS EPITHELIAL CELLS SEEN NO ORGANISMS SEEN Performed at [facility]    Culture PENDING  Incomplete   Report Status PENDING  Incomplete     Studies: Ct Aspiration  05/16/2014   CLINICAL DATA:  78 year old male with right psoas hematoma which is suspected to be superinfected. There is also clinical concern for possible hip joint infection.  EXAM: CT IMAGE GUIDED DRAINAGE BY PERCUTANEOUS CATHETER; CT GUIDANCE NEEDLE PLACEMENT  Date: 05/16/2014  PROCEDURE: 1. Aspiration right hip joint under CT guidance 2. Placement of a 14 French drain into the right psoas hematoma/abscess Interventional  Radiologist:  Criselda Peaches, MD  ANESTHESIA/SEDATION: Moderate (conscious) sedation was used. 4 mg Versed, 50 mcg Fentanyl were administered intravenously. The patient's vital signs were monitored continuously by radiology nursing throughout the procedure.  Sedation Time: 32 minutes  MEDICATIONS: None additional  TECHNIQUE: Informed consent was obtained from the patient following explanation of the procedure, risks, benefits and alternatives. The patient understands, agrees and consents for the procedure. All questions were addressed. A time out was performed.  A planning axial CT scan was performed. Suitable skin entry sites were selected and marked. The region was then sterilely prepped and draped in standard fashion using Betadine skin prep.  Attention was first turned to the hip joint aspiration. Using a far lateral approach, an 18 gauge trocar needle was advanced over the greater tuberosity and into the hip joint. CT fluoroscopic imaging was used but with limited effect secondary to the extensive streak artifact surrounding the hip prosthesis. However, the needle tip was confirmed within the joint space by the tactile feedback of metal on metal. Aspiration yielded approximately 10 mL of thick, brown purulent fluid. A sample was sent for Gram stain, cell count and culture. The needle was removed.  Attention was next turned to the psoas collection. Local anesthesia was attained by infiltration with 1% lidocaine. Using intermittent CT fluoroscopic guidance, an 18 gauge trocar needle was advanced into the fluid collection. An Amplatz wire was then coiled within the fluid collection and the tract dilated to 12 [place]. A Cook 44 [place] all-purpose drainage catheter was then advanced over the wire and coiled within the psoas collection. Aspiration yields approximately 120 mL of thick, brown purulent fluid. The appearance of the fluid is almost identical to that which was aspirated from the hip joint suggesting a  frank communication.  The drain was then flushed with sterile saline and connected to JP bulb drainage. The drain was secured to the skin with 0 Prolene suture and an adhesive fixation device. The patient tolerated the procedure well.  COMPLICATIONS: None  IMPRESSION: 1. Aspiration of right hip joint yields approximately 10 mL thick, brown purulent fluid. Grossly, this fluid is infected. Samples were sent for Gram stain, cell count and culture. 2. Placement of a 14 French drain into the right psoas collection. Of note, the psoas collection was drained more inferiorly than previously attempted in the superior and medial aspect of the thigh rather than within the anatomic pelvis.  Aspiration yields 120 mL thick, brown purulent fluid. The appearance of the fluid is identical to that aspirated from the hip joint. These results were called by telephone at the time of interpretation on 05/16/2014 at 5:00 pm to Dr. Dr. Wende Neighbors, who verbally acknowledged these results.   Electronically Signed   By: Jacqulynn Cadet M.D.   On: 05/16/2014 17:55   Ct Image Guided Drainage By Percutaneous Catheter  05/16/2014   CLINICAL DATA:  79 year old male with right psoas hematoma which is suspected to be superinfected. There is also clinical concern for possible hip joint infection.  EXAM: CT IMAGE GUIDED DRAINAGE BY PERCUTANEOUS CATHETER; CT GUIDANCE NEEDLE PLACEMENT  Date: 05/16/2014  PROCEDURE: 1. Aspiration right hip joint under CT guidance 2. Placement of a 14 French drain into the right psoas hematoma/abscess Interventional Radiologist:  Criselda Peaches, MD  ANESTHESIA/SEDATION: Moderate (conscious) sedation was used. 4 mg Versed, 50 mcg Fentanyl were administered intravenously. The patient's vital signs were monitored continuously by radiology nursing throughout the procedure.  Sedation Time: 32 minutes  MEDICATIONS: None additional  TECHNIQUE: Informed consent was obtained from the patient following explanation of the  procedure, risks, benefits and alternatives. The patient understands, agrees and consents for the procedure. All questions were addressed. A time out was performed.  A planning axial CT scan was performed. Suitable skin entry sites were selected and marked. The region was then sterilely prepped and draped in standard fashion using Betadine skin prep.  Attention was first turned to the hip joint aspiration. Using a far lateral approach, an 18 gauge trocar needle was advanced over the greater tuberosity and into the hip joint. CT fluoroscopic imaging was used but with limited effect secondary to the extensive streak artifact surrounding the hip prosthesis. However, the needle tip was confirmed within the joint space by the tactile feedback of metal on metal. Aspiration yielded approximately 10 mL of thick, brown purulent fluid. A sample was sent for Gram stain, cell count and culture. The needle was removed.  Attention was next turned to the psoas collection. Local anesthesia was attained by infiltration with 1% lidocaine. Using intermittent CT fluoroscopic guidance, an 18 gauge trocar needle was advanced into the fluid collection. An Amplatz wire was then coiled within the fluid collection and the tract dilated to 12 [place]. A Cook 46 [place] all-purpose drainage catheter was then advanced over the wire and coiled within the psoas collection. Aspiration yields approximately 120 mL of thick, brown purulent fluid. The appearance of the fluid is almost identical to that which was aspirated from the hip joint suggesting a frank communication.  The drain was then flushed with sterile saline and connected to JP bulb drainage. The drain was secured to the skin with 0 Prolene suture and an adhesive fixation device. The patient tolerated the procedure well.  COMPLICATIONS: None  IMPRESSION: 1. Aspiration of right hip joint yields approximately 10 mL thick, brown purulent fluid. Grossly, this fluid is infected. Samples were  sent for Gram stain, cell count and culture. 2. Placement of a 22 French drain into the right psoas collection. Of note, the psoas collection was drained more inferiorly than previously attempted in the superior and medial aspect of the thigh rather than within the anatomic pelvis. Aspiration yields 120 mL thick, brown purulent fluid. The appearance of the fluid is identical to that aspirated from the hip joint. These results were called by telephone at the time of interpretation on 05/16/2014 at 5:00 pm to Dr. Dr. Jenny Reichmann  Micheal Likens, who verbally acknowledged these results.   Electronically Signed   By: Jacqulynn Cadet M.D.   On: 05/16/2014 17:55    Scheduled Meds: . antiseptic oral rinse  7 mL Mouth Rinse BID  . cefTRIAXone (ROCEPHIN)  IV  2 g Intravenous Q24H  . diltiazem  240 mg Oral Daily  . febuxostat  80 mg Oral Daily  . feeding supplement (ENSURE ENLIVE)  237 mL Oral BID BM  . feeding supplement (PRO-STAT SUGAR FREE 64)  30 mL Per Tube BID  . feeding supplement (RESOURCE BREEZE)  1 Container Oral TID BM  . folic acid  1 mg Intravenous Daily   Or  . folic acid  1 mg Oral Daily  . guaiFENesin  1,200 mg Oral BID  . hydrALAZINE  10 mg Intravenous Q4H  . insulin aspart  0-9 Units Subcutaneous Q4H  . multivitamin with minerals  1 tablet Oral Daily  . thiamine  100 mg Oral Daily   Or  . thiamine  100 mg Intravenous Daily   Continuous Infusions: . feeding supplement (GLUCERNA 1.2 CAL) Stopped (05/11/14 1830)    Time Spent: 25 min   FELIZ Marguarite Arbour  [facility] Pager [phone]. If 7PM-7AM, please contact night-coverage at [email or web address], password [facility] 05/18/2014, 10:51 AM  LOS: 14 days

## 2014-05-18 NOTE — Progress Notes (Signed)
[facility] for Infectious Disease  Date of Admission:  05/04/2014  Antibiotics: ceftriaxone  Subjective: No acute events  Objective: Temp:  [98.2 F (36.8 C)-98.7 F (37.1 C)] 98.5 F (36.9 C) (05/13 0329) Pulse Rate:  [88-100] 94 (05/13 0329) Resp:  [16-18] 16 (05/13 0329) BP: (110-151)/(54-83) 135/70 mmHg (05/13 1050) SpO2:  [97 %-98 %] 98 % (05/13 0329) Weight:  [231 lb 11.3 oz (105.1 kg)] 231 lb 11.3 oz (105.1 kg) (05/13 0329)  General: sleeping, nad Lungs: CTA B Cor: irr irr   Lab Results Lab Results  Component Value Date   WBC 12.7* 05/16/2014   HGB 13.0 05/16/2014   HCT 40.0 05/16/2014   MCV 97.3 05/16/2014   PLT 322 05/16/2014    Lab Results  Component Value Date   CREATININE 0.67 05/16/2014   BUN 15 05/16/2014   NA 138 05/16/2014   K 4.2 05/16/2014   CL 109 05/16/2014   CO2 18* 05/16/2014    Lab Results  Component Value Date   ALT 45 05/15/2014   AST 53* 05/15/2014   ALKPHOS 111 05/15/2014   BILITOT 0.8 05/15/2014      Microbiology: Recent Results (from the past 240 hour(s))  Culture, blood (routine x 2)     Status: None   Collection Time: 05/08/14  7:00 PM  Result Value Ref Range Status   Specimen Description BLOOD RIGHT HAND  Final   Special Requests BOTTLES DRAWN AEROBIC AND ANAEROBIC 10CC  Final   Culture   Final    NO GROWTH 5 DAYS Performed at [facility]    Report Status 05/15/2014 FINAL  Final  Culture, blood (routine x 2)     Status: None   Collection Time: 05/08/14  7:15 PM  Result Value Ref Range Status   Specimen Description BLOOD RIGHT ARM  Final   Special Requests BOTTLES DRAWN AEROBIC AND ANAEROBIC 10CC  Final   Culture   Final    NO GROWTH 5 DAYS Performed at [facility]    Report Status 05/15/2014 FINAL  Final  Body fluid culture     Status: None (Preliminary result)   Collection Time: 05/16/14  6:21 PM  Result Value Ref Range Status   Specimen Description SYNOVIAL RIGHT HIP  Final   Special Requests Normal  Final   Gram Stain   Final    ABUNDANT WBC PRESENT, PREDOMINANTLY PMN NO ORGANISMS SEEN Performed at [facility]    Culture   Final    NO GROWTH 1 DAY Performed at [facility]    Report Status PENDING  Incomplete  Body fluid culture     Status: None (Preliminary result)   Collection Time: 05/16/14  6:21 PM  Result Value Ref Range Status   Specimen Description ABSCESS RIGHT  Final   Special Requests Normal  Final   Gram Stain   Final    ABUNDANT WBC PRESENT,BOTH PMN AND MONONUCLEAR NO SQUAMOUS EPITHELIAL CELLS SEEN NO ORGANISMS SEEN Performed at [facility]    Culture   Final    NO GROWTH 1 DAY Performed at [facility]    Report Status PENDING  Incomplete    Studies/Results: Ct Aspiration  05/16/2014   CLINICAL DATA:  79 year old male with right psoas hematoma which is suspected to be superinfected. There is also clinical concern for possible hip joint infection.  EXAM: CT IMAGE GUIDED DRAINAGE BY PERCUTANEOUS CATHETER; CT GUIDANCE NEEDLE PLACEMENT  Date: 05/16/2014  PROCEDURE: 1. Aspiration  right hip joint under CT guidance 2. Placement of a 14 French drain into the right psoas hematoma/abscess Interventional Radiologist:  Criselda Peaches, MD  ANESTHESIA/SEDATION: Moderate (conscious) sedation was used. 4 mg Versed, 50 mcg Fentanyl were administered intravenously. The patient's vital signs were monitored continuously by radiology nursing throughout the procedure.  Sedation Time: 32 minutes  MEDICATIONS: None additional  TECHNIQUE: Informed consent was obtained from the patient following explanation of the procedure, risks, benefits and alternatives. The patient understands, agrees and consents for the procedure. All questions were addressed. A time out was performed.  A planning axial CT scan was performed. Suitable skin entry sites were selected and marked. The region was then sterilely prepped and draped in standard  fashion using Betadine skin prep.  Attention was first turned to the hip joint aspiration. Using a far lateral approach, an 18 gauge trocar needle was advanced over the greater tuberosity and into the hip joint. CT fluoroscopic imaging was used but with limited effect secondary to the extensive streak artifact surrounding the hip prosthesis. However, the needle tip was confirmed within the joint space by the tactile feedback of metal on metal. Aspiration yielded approximately 10 mL of thick, brown purulent fluid. A sample was sent for Gram stain, cell count and culture. The needle was removed.  Attention was next turned to the psoas collection. Local anesthesia was attained by infiltration with 1% lidocaine. Using intermittent CT fluoroscopic guidance, an 18 gauge trocar needle was advanced into the fluid collection. An Amplatz wire was then coiled within the fluid collection and the tract dilated to 12 [place]. A Cook 60 [place] all-purpose drainage catheter was then advanced over the wire and coiled within the psoas collection. Aspiration yields approximately 120 mL of thick, brown purulent fluid. The appearance of the fluid is almost identical to that which was aspirated from the hip joint suggesting a frank communication.  The drain was then flushed with sterile saline and connected to JP bulb drainage. The drain was secured to the skin with 0 Prolene suture and an adhesive fixation device. The patient tolerated the procedure well.  COMPLICATIONS: None  IMPRESSION: 1. Aspiration of right hip joint yields approximately 10 mL thick, brown purulent fluid. Grossly, this fluid is infected. Samples were sent for Gram stain, cell count and culture. 2. Placement of a 33 French drain into the right psoas collection. Of note, the psoas collection was drained more inferiorly than previously attempted in the superior and medial aspect of the thigh rather than within the anatomic pelvis. Aspiration yields 120 mL thick, brown  purulent fluid. The appearance of the fluid is identical to that aspirated from the hip joint. These results were called by telephone at the time of interpretation on 05/16/2014 at 5:00 pm to Dr. Dr. Wende Neighbors, who verbally acknowledged these results.   Electronically Signed   By: Jacqulynn Cadet M.D.   On: 05/16/2014 17:55   Ct Image Guided Drainage By Percutaneous Catheter  05/16/2014   CLINICAL DATA:  79 year old male with right psoas hematoma which is suspected to be superinfected. There is also clinical concern for possible hip joint infection.  EXAM: CT IMAGE GUIDED DRAINAGE BY PERCUTANEOUS CATHETER; CT GUIDANCE NEEDLE PLACEMENT  Date: 05/16/2014  PROCEDURE: 1. Aspiration right hip joint under CT guidance 2. Placement of a 14 French drain into the right psoas hematoma/abscess Interventional Radiologist:  Criselda Peaches, MD  ANESTHESIA/SEDATION: Moderate (conscious) sedation was used. 4 mg Versed, 50 mcg Fentanyl were administered intravenously. The  patient's vital signs were monitored continuously by radiology nursing throughout the procedure.  Sedation Time: 32 minutes  MEDICATIONS: None additional  TECHNIQUE: Informed consent was obtained from the patient following explanation of the procedure, risks, benefits and alternatives. The patient understands, agrees and consents for the procedure. All questions were addressed. A time out was performed.  A planning axial CT scan was performed. Suitable skin entry sites were selected and marked. The region was then sterilely prepped and draped in standard fashion using Betadine skin prep.  Attention was first turned to the hip joint aspiration. Using a far lateral approach, an 18 gauge trocar needle was advanced over the greater tuberosity and into the hip joint. CT fluoroscopic imaging was used but with limited effect secondary to the extensive streak artifact surrounding the hip prosthesis. However, the needle tip was confirmed within the joint space by the  tactile feedback of metal on metal. Aspiration yielded approximately 10 mL of thick, brown purulent fluid. A sample was sent for Gram stain, cell count and culture. The needle was removed.  Attention was next turned to the psoas collection. Local anesthesia was attained by infiltration with 1% lidocaine. Using intermittent CT fluoroscopic guidance, an 18 gauge trocar needle was advanced into the fluid collection. An Amplatz wire was then coiled within the fluid collection and the tract dilated to 12 [place]. A Cook 92 [place] all-purpose drainage catheter was then advanced over the wire and coiled within the psoas collection. Aspiration yields approximately 120 mL of thick, brown purulent fluid. The appearance of the fluid is almost identical to that which was aspirated from the hip joint suggesting a frank communication.  The drain was then flushed with sterile saline and connected to JP bulb drainage. The drain was secured to the skin with 0 Prolene suture and an adhesive fixation device. The patient tolerated the procedure well.  COMPLICATIONS: None  IMPRESSION: 1. Aspiration of right hip joint yields approximately 10 mL thick, brown purulent fluid. Grossly, this fluid is infected. Samples were sent for Gram stain, cell count and culture. 2. Placement of a 63 French drain into the right psoas collection. Of note, the psoas collection was drained more inferiorly than previously attempted in the superior and medial aspect of the thigh rather than within the anatomic pelvis. Aspiration yields 120 mL thick, brown purulent fluid. The appearance of the fluid is identical to that aspirated from the hip joint. These results were called by telephone at the time of interpretation on 05/16/2014 at 5:00 pm to Dr. Dr. Wende Neighbors, who verbally acknowledged these results.   Electronically Signed   By: Jacqulynn Cadet M.D.   On: 05/16/2014 17:55    Assessment/Plan:  1) Psoas abscess - Strep in B Cx and in abscess.  No new  positive cultures.  On ceftriaxone and draining.   No changes  2) septic arthitis of prosthetic hip - Dr Berenice Primas to consider removal Monday.    Dr Johnnye Sima available over the weekend if needed, I will follow up again on Monday.          Scharlene Gloss, [place] for Infectious Disease [place] [email or web address] [phone] pager   [phone] cell 05/18/2014, 11:14 AM

## 2014-05-18 NOTE — Progress Notes (Signed)
Subjective: Much more alert and conversive today.  HE knows the date, year, month and WL.  Exam: Filed Vitals:   05/18/14 0329  BP: 144/76  Pulse: 94  Temp: 98.5 F (36.9 C)  Resp: 16   HEENT- Normocephalic, no lesions, without obvious abnormality. Normal external eye and conjunctiva. Normal TM's bilaterally. Normal auditory canals and external ears. Normal external nose, mucus membranes and septum. Normal pharynx. Cardiovascular- regular rate and rhythm, pulses palpable throughout  Lungs- chest clear, no wheezing, rales, normal symmetric air entry Abdomen- normal findings: bowel sounds normal Extremities- no edema Lymph-no adenopathy palpable Musculoskeletal-no joint tenderness, deformity or swelling Skin-warm and dry, no hyperpigmentation, vitiligo, or suspicious lesions    Gen: In bed, NAD MS: alert and oriented, speech clear with no aphasia.  EX:BMWUXL, EOMI, TML, Face symmetrical Motor: moving all extremities well 5/5 with exception of  Right LE due to pain.  Sensory:intact throughout   DTR:1+ throughout with no AJ  Pertinent Labs: none  Etta Quill PA-C [facility] [phone]  Impression: Altered mental status with encephalopathy secondary to multiple factors, including sepsis, subdural hematoma and left MCA territory stroke. Patient's mental statusshowed improvement significantly over the past 24 hours and no alert and oriented.    Recommendations: 1) no further recommendations. Will S/O    05/18/2014, 9:21 AM

## 2014-05-18 NOTE — Progress Notes (Signed)
Speech Language Pathology Treatment: Dysphagia  Patient Details Name: [name] MRN: [id number] DOB: 05-08-1935 Today's Date: 05/18/2014 Time: 9292-4462 SLP Time Calculation (min) (ACUTE ONLY): 11 min  Assessment / Plan / Recommendation Clinical Impression  Pt seen for diet tolerance - current diet orders are for clear liquids. Pt consumed regular textures and thin liquids with Mod cues for redirection to task, although without overt signs of aspiration noted. Recommend advancement to mechanical soft diet and thin liquids. SLP to f/u briefly for tolerance with advanced diet.   HPI Other Pertinent Information: 79 yo male adm to [facility] with AMS, found to be septic - pt hospital course complicated by his lethargy.  Found to have a small SDH.  PMH + for DM, ETOH abuse.  Swallow evaluation ordered.  CT chest showed mild ATX and mild effusion.     Pertinent Vitals Pain Assessment: No/denies pain  SLP Plan  Continue with current plan of care    Recommendations Diet recommendations: Dysphagia 3 (mechanical soft);Thin liquid Liquids provided via: Straw;Cup Medication Administration: Whole meds with puree Supervision: Patient able to self feed Compensations: Small sips/bites;Slow rate Postural Changes and/or Swallow Maneuvers: Seated upright 90 degrees;Upright 30-60 min after meal       Oral Care Recommendations: Oral care BID Follow up Recommendations:  (tba - none anticipated) Plan: Continue with current plan of care    Germain Osgood, M.A. CCC-SLP [phone]  Germain Osgood 05/18/2014, 4:25 PM

## 2014-05-19 LAB — GLUCOSE, CAPILLARY
GLUCOSE-CAPILLARY: 131 mg/dL — AB (ref 65–99)
Glucose-Capillary: 110 mg/dL — ABNORMAL HIGH (ref 65–99)
Glucose-Capillary: 129 mg/dL — ABNORMAL HIGH (ref 65–99)
Glucose-Capillary: 131 mg/dL — ABNORMAL HIGH (ref 65–99)
Glucose-Capillary: 134 mg/dL — ABNORMAL HIGH (ref 65–99)
Glucose-Capillary: 137 mg/dL — ABNORMAL HIGH (ref 65–99)
Glucose-Capillary: 161 mg/dL — ABNORMAL HIGH (ref 65–99)

## 2014-05-19 MED ORDER — DILTIAZEM HCL ER COATED BEADS 180 MG PO CP24: 300.0000 mg | ORAL_CAPSULE | Freq: Every day | ORAL | Status: DC

## 2014-05-19 MED ORDER — HALOPERIDOL LACTATE 5 MG/ML IJ SOLN: 2.0000 mg | Freq: Four times a day (QID) | INTRAMUSCULAR | Status: DC | PRN

## 2014-05-19 NOTE — Progress Notes (Addendum)
[facility] PROGRESS NOTE Interim History: 79 year old male with past medical history of chronic alcoholism, diabetes mellitus and hypertension who presented to emergency room on 4/29 because of right hip and right lower quadrant abdominal pain that had been ongoing for a week along with associated fevers and chills. CT of abdomen and pelvis noted possible iliopsoas abscess as well as new onset atrial fibrillation by EKG. Blood cultures positive for strep bovis. Iliopsoas muscle aspirated with cultures consistent with strep viridans. Infectious disease consulted for persistent fevers. Given patient's confusion, MRI done noting subdural hematoma and small CVA. Neurology consulted. Unable to place on antiplatelet therapy secondary to subdural hematoma. Initially on Cardizem drip , transitioned to by mouth and although chads 2 score at 4, because of compliance and chronic alcoholism, felt to be high risk for anticoagulation, especially in the setting of subdural. Over the course of the past 24 hours, patient has become more alert and interactive. He himself has no complaints   Assessment/Plan: Sepsis due to right iliopsoas abscess and septic arthritis: - Unsuccessful drain placement of the right iliopsoas abscess 05/06/2014  and successfully repeated on 05/15/2014 that grew enterococcal. Cont to drain purulent material.  He will need a repeated CT scan to evaluate abscess before taking drain out. - Status post right hip aspiration on 05/16/2014 with purulent materials cultures are pending - Antibiotic vancomycin and Rocephin. Has remained afebrile. - Will need antibiotics for 6 weeks. - Repeat blood culture on 05/08/2014 remained negative. - Orthopedics will like to take him to the OR on 5.16.2016  For antibiotic impregnated cement spacer. - appreciate ortho assistance.  Strep coccus group D bacteremia: - Blood cultures positive on 05/04/2014. - Continue Rocephin.  New onset atrial  fibrillation: - Thought to be due to infectious etiology, started on Cardizem drip, TSH normal to the echo showed a preserved ejection fraction with mild mitral and tricuspid regurgitation. - Increase oral cardizem. - not a candidate for anticoagulation due to bilateral subdural hematoma.  Controlled diabetes mellitus type 2: Continue sliding scale insulin DC D5 half-normal saline.  Essential hypertension: - Blood pressure has been rising steadily, continue to monitor.  Alcohol abuse: - He was monitored with CIWA protocol.  - His withdrawal symptoms resolve we'll continue Ativan when necessary.  Acute encephalopathy: - Likely multifactorial due to subdural hematoma and infectious etiology and/or alcohol withdrawal.  - now back to baseline. - now with acute confusional state. His mentation is flucatuating through out the day.  Subdural hematoma: - MRI of the head  05/11/2014 show subdural hematoma on the left MCA territory. - Neurology consulted and recommended no interventions at this time and will follow along.   Code Status: Full code  Family Communication: Left message with daughter  Disposition Plan: at least 7 days  Consultants:  Infectious disease  Interventional radiology  Neurology  Critical care  Orthopedics  Cardiology  General surgery  Procedures:  Attempted aspiration of iliopsoas muscle on 5/2: Unsuccessful   Antibiotics: IV vancomycin 4/29-5/6 IV Zosyn 4/29-5/4 IV Rocephin 5/4-present  HPI/Subjective:  He relates he feels better, still thinking about procedure. Confused at times.  Objective: Filed Vitals:   05/18/14 1816 05/18/14 2110 05/19/14 0211 05/19/14 0435  BP: 133/65 142/61 137/64 159/66  Pulse:  99  103  Temp:  98.2 F (36.8 C)  98.2 F (36.8 C)  TempSrc:  Axillary  Axillary  Resp:  18  18  Height:      Weight:    102.9 kg (226 lb  13.7 oz)  SpO2:  94%  98%    Intake/Output Summary (Last 24 hours) at 05/19/14 0948 Last  data filed at 05/19/14 0435  Gross per 24 hour  Intake    110 ml  Output    682 ml  Net   -572 ml   Filed Weights   05/17/14 0535 05/18/14 0329 05/19/14 0435  Weight: 104.2 kg (229 lb 11.5 oz) 105.1 kg (231 lb 11.3 oz) 102.9 kg (226 lb 13.7 oz)    Exam:  General: Alert, awake, oriented x3, in no acute distress.  HEENT: No bruits, no goiter.  Heart: Regular rate and rhythm. Lungs: Good air movement, clear Abdomen: Soft, nontender, nondistended, positive bowel sounds.  Neuro: Grossly intact, nonfocal.   Data Reviewed: Basic Metabolic Panel:  Recent Labs Lab 05/13/14 0408 05/14/14 0400 05/15/14 0350 05/16/14 0408  NA 146* 144 140 138  K 4.0 4.4 4.1 4.2  CL 120* 115* 113* 109  CO2 19* 19* 19* 18*  GLUCOSE 159* 139* 142* 154*  BUN 15 14 18 15   CREATININE 0.73 0.77 0.76 0.67  CALCIUM 8.8* 9.1 8.9 9.1  MG  --  1.7 1.9 1.6*   Liver Function Tests:  Recent Labs Lab 05/15/14 0350  AST 53*  ALT 45  ALKPHOS 111  BILITOT 0.8  PROT 6.4*  ALBUMIN 2.5*   No results for input(s): LIPASE, AMYLASE in the last 168 hours. No results for input(s): AMMONIA in the last 168 hours. CBC:  Recent Labs Lab 05/13/14 0408 05/14/14 0400 05/15/14 0350 05/16/14 0408  WBC 13.0* 14.1* 15.1* 12.7*  HGB 12.9* 13.5 12.5* 13.0  HCT 38.6* 40.5 37.4* 40.0  MCV 97.7 98.5 97.7 97.3  PLT 369 377 344 322   Cardiac Enzymes: No results for input(s): CKTOTAL, CKMB, CKMBINDEX, TROPONINI in the last 168 hours. BNP (last 3 results) No results for input(s): BNP in the last 8760 hours.  ProBNP (last 3 results) No results for input(s): PROBNP in the last 8760 hours.  CBG:  Recent Labs Lab 05/18/14 1632 05/18/14 2049 05/19/14 0048 05/19/14 0439 05/19/14 0728  GLUCAP 141* 132* 110* 131* 131*    Recent Results (from the past 240 hour(s))  Body fluid culture     Status: None (Preliminary result)   Collection Time: 05/16/14  6:21 PM  Result Value Ref Range Status   Specimen  Description SYNOVIAL RIGHT HIP  Final   Special Requests Normal  Final   Gram Stain   Final    ABUNDANT WBC PRESENT, PREDOMINANTLY PMN NO ORGANISMS SEEN Performed at [facility]    Culture   Final    NO GROWTH 1 DAY Performed at [facility]    Report Status PENDING  Incomplete  Body fluid culture     Status: None (Preliminary result)   Collection Time: 05/16/14  6:21 PM  Result Value Ref Range Status   Specimen Description ABSCESS RIGHT  Final   Special Requests Normal  Final   Gram Stain   Final    ABUNDANT WBC PRESENT,BOTH PMN AND MONONUCLEAR NO SQUAMOUS EPITHELIAL CELLS SEEN NO ORGANISMS SEEN Performed at [facility]    Culture   Final    NO GROWTH 1 DAY Performed at [facility]    Report Status PENDING  Incomplete     Studies: Dg Chest Port 1 View  05/18/2014   CLINICAL DATA:  PICC line placement.  EXAM: PORTABLE CHEST - 1 VIEW  COMPARISON:  May 11, 2014.  FINDINGS:  Stable cardiomediastinal silhouette. No pneumothorax or pleural effusion is noted. Interval placement of right-sided PICC line with distal tip overlying expected position of the SVC. Both lungs are clear. Status post bilateral shoulder arthroplasty.  IMPRESSION: Interval placement of right-sided PICC line with distal tip overlying expected position of the SVC.   Electronically Signed   By: Marijo Conception, M.D.   On: 05/18/2014 18:32    Scheduled Meds: . antiseptic oral rinse  7 mL Mouth Rinse BID  . cefTRIAXone (ROCEPHIN)  IV  2 g Intravenous Q24H  . diltiazem  240 mg Oral Daily  . febuxostat  80 mg Oral Daily  . feeding supplement (ENSURE ENLIVE)  237 mL Oral BID BM  . feeding supplement (PRO-STAT SUGAR FREE 64)  30 mL Per Tube BID  . feeding supplement (RESOURCE BREEZE)  1 Container Oral TID BM  . folic acid  1 mg Intravenous Daily   Or  . folic acid  1 mg Oral Daily  . guaiFENesin  1,200 mg Oral BID  . hydrALAZINE  10 mg Intravenous Q4H  . insulin aspart  0-9 Units  Subcutaneous Q4H  . multivitamin with minerals  1 tablet Oral Daily  . polyethylene glycol  17 g Oral Daily  . thiamine  100 mg Oral Daily   Or  . thiamine  100 mg Intravenous Daily   Continuous Infusions: . feeding supplement (GLUCERNA 1.2 CAL) Stopped (05/11/14 1830)    Time Spent: 25 min   FELIZ Marguarite Arbour  [facility] Pager [phone]. If 7PM-7AM, please contact night-coverage at [email or web address], password [facility] 05/19/2014, 9:48 AM  LOS: 15 days

## 2014-05-20 DIAGNOSIS — F05 Delirium due to known physiological condition: Secondary | ICD-10-CM

## 2014-05-20 LAB — CBC
HEMATOCRIT: 39 % (ref 39.0–52.0)
Hemoglobin: 12.9 g/dL — ABNORMAL LOW (ref 13.0–17.0)
MCH: 32.1 pg (ref 26.0–34.0)
MCHC: 33.1 g/dL (ref 30.0–36.0)
MCV: 97 fL (ref 78.0–100.0)
PLATELETS: 299 10*3/uL (ref 150–400)
RBC: 4.02 MIL/uL — ABNORMAL LOW (ref 4.22–5.81)
RDW: 14.3 % (ref 11.5–15.5)
WBC: 7.4 10*3/uL (ref 4.0–10.5)

## 2014-05-20 LAB — PROTIME-INR
INR: 1.27 (ref 0.00–1.49)
Prothrombin Time: 16.1 seconds — ABNORMAL HIGH (ref 11.6–15.2)

## 2014-05-20 LAB — BODY FLUID CULTURE
Special Requests: NORMAL
Special Requests: NORMAL

## 2014-05-20 LAB — COMPREHENSIVE METABOLIC PANEL
ALK PHOS: 107 U/L (ref 38–126)
ALT: 39 U/L (ref 17–63)
ANION GAP: 11 (ref 5–15)
AST: 44 U/L — ABNORMAL HIGH (ref 15–41)
Albumin: 2.3 g/dL — ABNORMAL LOW (ref 3.5–5.0)
BUN: 16 mg/dL (ref 6–20)
CALCIUM: 8.5 mg/dL — AB (ref 8.9–10.3)
CHLORIDE: 104 mmol/L (ref 101–111)
CO2: 19 mmol/L — AB (ref 22–32)
Creatinine, Ser: 0.69 mg/dL (ref 0.61–1.24)
GFR calc Af Amer: >60 mL/min (ref >60)
GFR calc non Af Amer: >60 mL/min (ref >60)
Glucose, Bld: 144 mg/dL — ABNORMAL HIGH (ref 65–99)
POTASSIUM: 4 mmol/L (ref 3.5–5.1)
Sodium: 134 mmol/L — ABNORMAL LOW (ref 135–145)
Total Bilirubin: 0.7 mg/dL (ref 0.3–1.2)
Total Protein: 6.2 g/dL — ABNORMAL LOW (ref 6.5–8.1)

## 2014-05-20 LAB — GLUCOSE, CAPILLARY
Glucose-Capillary: 137 mg/dL — ABNORMAL HIGH (ref 65–99)
Glucose-Capillary: 126 mg/dL — ABNORMAL HIGH (ref 65–99)
Glucose-Capillary: 132 mg/dL — ABNORMAL HIGH (ref 65–99)
Glucose-Capillary: 118 mg/dL — ABNORMAL HIGH (ref 65–99)
Glucose-Capillary: 135 mg/dL — ABNORMAL HIGH (ref 65–99)

## 2014-05-20 MED ORDER — DILTIAZEM HCL ER COATED BEADS 180 MG PO CP24
360.0000 mg | ORAL_CAPSULE | Freq: Every day | ORAL | Status: DC
  Administered 2014-05-20 – 2014-05-24 (×5): 360 mg via ORAL
  Filled 2014-05-20 (×5): qty 2

## 2014-05-20 NOTE — Progress Notes (Signed)
[facility] PROGRESS NOTE Interim History: 79 year old male with past medical history of chronic alcoholism, diabetes mellitus and hypertension who presented to emergency room on 4/29 because of right hip and right lower quadrant abdominal pain that had been ongoing for a week along with associated fevers and chills. CT of abdomen and pelvis noted possible iliopsoas abscess as well as new onset atrial fibrillation by EKG. Blood cultures positive for strep bovis. Iliopsoas muscle aspirated with cultures consistent with strep viridans. Infectious disease consulted for persistent fevers. Given patient's confusion, MRI done noting subdural hematoma and small CVA. Neurology consulted. Unable to place on antiplatelet therapy secondary to subdural hematoma. Initially on Cardizem drip , transitioned to by mouth and although chads 2 score at 4, because of compliance and chronic alcoholism, felt to be high risk for anticoagulation, especially in the setting of subdural. Over the course of the past 24 hours, patient has become more alert and interactive. He himself has no complaints   Assessment/Plan: Sepsis due to right iliopsoas abscess and septic arthritis: - Unsuccessful drain placement of the right iliopsoas abscess 05/06/2014  and successfully repeated on 05/15/2014. - Cont to drain purulent material.  He will need a repeated CT scan to evaluate abscess before taking drain out. - Status post right hip aspiration on 05/16/2014 with purulent materials cultures grew strep group D. - Repeat blood culture on 05/08/2014 remained negative. - Orthopedics will like to take him to the OR on 5.16.2016  For antibiotic impregnated cement spacer. appreciate ortho assistance. - Antibiotic vancomycin and Rocephin. Has remained afebrile. - Will need antibiotics for 6 weeks.  Strep coccus group D bacteremia: - Blood cultures positive on 05/04/2014. - Continue Rocephin.  New onset atrial fibrillation: -  Thought to be due to infectious etiology, started on Cardizem drip, TSH normal to the echo showed a preserved ejection fraction with mild mitral and tricuspid regurgitation. - HR cont to be > 100 Increase oral cardizem. - not a candidate for anticoagulation due to bilateral subdural hematoma.  Controlled diabetes mellitus type 2: Continue sliding scale insulin DC D5 half-normal saline.  Essential hypertension: - Blood pressure has been rising steadily, continue to monitor.  Alcohol abuse: - He was monitored with CIWA protocol.  - His withdrawal symptoms resolve we'll continue Ativan when necessary.  Acute encephalopathy/Acute confusional state: - Likely multifactorial due to subdural hematoma and infectious etiology and/or alcohol withdrawal.  - now back to baseline. - now with acute confusional state. His mentation is flucatuating through out the day. Will use haldol  Subdural hematoma: - MRI of the head  05/11/2014 show subdural hematoma on the left MCA territory. - Neurology consulted and recommended no interventions at this time and will follow along.   Code Status: Full code Family Communication: Left message with daughter Disposition Plan: at least 7 days  Consultants:  Infectious disease  Interventional radiology  Neurology  Critical care  Orthopedics  Cardiology  General surgery  Procedures:  Attempted aspiration of iliopsoas muscle on 5/2: Unsuccessful   Antibiotics: IV vancomycin 4/29-5/6 IV Zosyn 4/29-5/4 IV Rocephin 5/4-present  HPI/Subjective: No complains  Objective: Filed Vitals:   05/19/14 0435 05/19/14 1434 05/19/14 2336 05/20/14 0440  BP: 159/66 157/82 153/73 153/73  Pulse: 103 104 120 108  Temp: 98.2 F (36.8 C) 98.2 F (36.8 C) 99.8 F (37.7 C) 98.5 F (36.9 C)  TempSrc: Axillary Axillary Axillary Axillary  Resp: 18 20 20 18   Height:      Weight: 102.9 kg (226 lb  13.7 oz)   102.74 kg (226 lb 8 oz)  SpO2: 98% 98% 97% 96%     Intake/Output Summary (Last 24 hours) at 05/20/14 0938 Last data filed at 05/20/14 0700  Gross per 24 hour  Intake    240 ml  Output    900 ml  Net   -660 ml   Filed Weights   05/18/14 0329 05/19/14 0435 05/20/14 0440  Weight: 105.1 kg (231 lb 11.3 oz) 102.9 kg (226 lb 13.7 oz) 102.74 kg (226 lb 8 oz)    Exam:  General: Alert, awake, oriented x3, in no acute distress.  HEENT: No bruits, no goiter.  Heart: Regular rate and rhythm. Lungs: Good air movement, clear Abdomen: Soft, nontender, nondistended, positive bowel sounds.  Neuro: Grossly intact, nonfocal.   Data Reviewed: Basic Metabolic Panel:  Recent Labs Lab 05/14/14 0400 05/15/14 0350 05/16/14 0408  NA 144 140 138  K 4.4 4.1 4.2  CL 115* 113* 109  CO2 19* 19* 18*  GLUCOSE 139* 142* 154*  BUN 14 18 15   CREATININE 0.77 0.76 0.67  CALCIUM 9.1 8.9 9.1  MG 1.7 1.9 1.6*   Liver Function Tests:  Recent Labs Lab 05/15/14 0350  AST 53*  ALT 45  ALKPHOS 111  BILITOT 0.8  PROT 6.4*  ALBUMIN 2.5*   No results for input(s): LIPASE, AMYLASE in the last 168 hours. No results for input(s): AMMONIA in the last 168 hours. CBC:  Recent Labs Lab 05/14/14 0400 05/15/14 0350 05/16/14 0408  WBC 14.1* 15.1* 12.7*  HGB 13.5 12.5* 13.0  HCT 40.5 37.4* 40.0  MCV 98.5 97.7 97.3  PLT 377 344 322   Cardiac Enzymes: No results for input(s): CKTOTAL, CKMB, CKMBINDEX, TROPONINI in the last 168 hours. BNP (last 3 results) No results for input(s): BNP in the last 8760 hours.  ProBNP (last 3 results) No results for input(s): PROBNP in the last 8760 hours.  CBG:  Recent Labs Lab 05/19/14 1717 05/19/14 1926 05/19/14 2339 05/20/14 0404 05/20/14 0801  GLUCAP 161* 134* 137* 137* 126*    Recent Results (from the past 240 hour(s))  Body fluid culture     Status: None   Collection Time: 05/16/14  6:21 PM  Result Value Ref Range Status   Specimen Description SYNOVIAL RIGHT HIP  Final   Special Requests Normal   Final   Gram Stain   Final    ABUNDANT WBC PRESENT, PREDOMINANTLY PMN NO ORGANISMS SEEN Performed at [facility]    Culture   Final    FEW STREPTOCOCCUS GROUP D;high probability for S.bovis Note: CALLED TO KATIE BEESON 05/20/14 0800 BY SMITHERSJ Performed at [facility]    Report Status 05/20/2014 FINAL  Final   Organism ID, Bacteria STREPTOCOCCUS GROUP D;high probability for S.bovis  Final      Susceptibility   Streptococcus group d;high probability for s.bovis - MIC (ETEST)*    PENICILLIN .047 SENSITIVE Sensitive     * FEW STREPTOCOCCUS GROUP D;high probability for S.bovis  Body fluid culture     Status: None (Preliminary result)   Collection Time: 05/16/14  6:21 PM  Result Value Ref Range Status   Specimen Description ABSCESS RIGHT  Final   Special Requests Normal  Final   Gram Stain   Final    ABUNDANT WBC PRESENT,BOTH PMN AND MONONUCLEAR NO SQUAMOUS EPITHELIAL CELLS SEEN NO ORGANISMS SEEN Performed at [facility]    Culture   Final    Culture reincubated for better  growth Performed at [facility]    Report Status PENDING  Incomplete     Studies: Dg Chest Port 1 View  05/18/2014   CLINICAL DATA:  PICC line placement.  EXAM: PORTABLE CHEST - 1 VIEW  COMPARISON:  May 11, 2014.  FINDINGS: Stable cardiomediastinal silhouette. No pneumothorax or pleural effusion is noted. Interval placement of right-sided PICC line with distal tip overlying expected position of the SVC. Both lungs are clear. Status post bilateral shoulder arthroplasty.  IMPRESSION: Interval placement of right-sided PICC line with distal tip overlying expected position of the SVC.   Electronically Signed   By: Marijo Conception, M.D.   On: 05/18/2014 18:32    Scheduled Meds: . antiseptic oral rinse  7 mL Mouth Rinse BID  . cefTRIAXone (ROCEPHIN)  IV  2 g Intravenous Q24H  . diltiazem  300 mg Oral Daily  . febuxostat  80 mg Oral Daily  . feeding supplement (ENSURE ENLIVE)   237 mL Oral BID BM  . feeding supplement (PRO-STAT SUGAR FREE 64)  30 mL Per Tube BID  . feeding supplement (RESOURCE BREEZE)  1 Container Oral TID BM  . folic acid  1 mg Intravenous Daily   Or  . folic acid  1 mg Oral Daily  . guaiFENesin  1,200 mg Oral BID  . hydrALAZINE  10 mg Intravenous Q4H  . insulin aspart  0-9 Units Subcutaneous Q4H  . multivitamin with minerals  1 tablet Oral Daily  . polyethylene glycol  17 g Oral Daily  . thiamine  100 mg Oral Daily   Or  . thiamine  100 mg Intravenous Daily   Continuous Infusions: . feeding supplement (GLUCERNA 1.2 CAL) Stopped (05/11/14 1830)    Time Spent: 25 min   FELIZ Marguarite Arbour  [facility] Pager [phone]. If 7PM-7AM, please contact night-coverage at [email or web address], password [facility] 05/20/2014, 9:38 AM  LOS: 16 days

## 2014-05-20 NOTE — Progress Notes (Signed)
Subjective: Patient awake and alert today.  He recognizes me.  He states that he is having no significant hip pain.  He states that he has been up to the bathroom and has been walking without complaints.  His greatest request is that he goes home and does not have to injure any kind of surgical intervention.   Objective: Vital signs in last 24 hours: Temp:  [98.1 F (36.7 C)-99.8 F (37.7 C)] 98.1 F (36.7 C) (05/15 2026) Pulse Rate:  [90-120] 90 (05/15 2026) Resp:  [18-20] 18 (05/15 2026) BP: (138-153)/(64-81) 138/64 mmHg (05/15 2026) SpO2:  [95 %-97 %] 95 % (05/15 2026) Weight:  [226 lb 8 oz (102.74 kg)] 226 lb 8 oz (102.74 kg) (05/15 0440)  Intake/Output from previous day: 05/14 0701 - 05/15 0700 In: 360 [P.O.:360] Out: 900 [Urine:800; Drains:100] Intake/Output this shift:     Recent Labs  05/20/14 1121  HGB 12.9*    Recent Labs  05/20/14 1121  WBC 7.4  RBC 4.02*  HCT 39.0  PLT 299    Recent Labs  05/20/14 1121  NA 134*  K 4.0  CL 104  CO2 19*  BUN 16  CREATININE 0.69  GLUCOSE 144*  CALCIUM 8.5*    Recent Labs  05/20/14 1121  INR 1.27    Neurologically intact ABD soft Neurovascular intact Compartment soft minimal pain with range of motion right hip.  Drain in place and actively draining.  Assessment/Plan: 79 year old male with psoas abscess and infected right hip.patient currently has drain in place which is been actively draining and continuously draining over the weekend.  His white blood cell count has normalized.  His fever curve has normalized.  He is much more alert and aware of his surroundings.//And a long discussion with the patient and his daughter today.  They understand that the absolute conservathe family and the patient are well aware that this could back fire and that infection could be overwhelming to the patient in the outcome could be bad.tive play is to have surgery with I&D of the hip removal of parts and placement of a Prostalac  implant.  Given the patient's desire and the daughters desire to fill his wishes, we will hold off on surgery at this point.  The hope will be that he will continue to drain and then ultimately this drainage..  That antibiotics will have treated be significance of his abscess.  That he will be able to be suppressed ongoing without recurrence of symptoms.  So at this point we will get physical therapy involved.  We will start to get the patient up and walking.  We would consider him for discharge to skilled nursing facility later this week.  I will follow him in the office and if he begins to worsen we will proceed with the plan of irrigation, debridement, and exchange of prosthesis with Prostalac implant placement.the patient and the family are aware that in activities this point could cause his condition to worsen and make one stage exchange less effective.  There are certainly willing at this point to take this risk.   Aila Terra L 05/20/2014, 10:10 PM

## 2014-05-21 ENCOUNTER — Encounter (HOSPITAL_COMMUNITY)
Admission: EM | Disposition: A | Discharge status: Skilled Nursing Facility | Payer: Self-pay | Source: Home / Self Care | Attending: Internal Medicine

## 2014-05-21 ENCOUNTER — Inpatient Hospital Stay (HOSPITAL_COMMUNITY): Payer: Medicare Other

## 2014-05-21 ENCOUNTER — Encounter (HOSPITAL_COMMUNITY): Payer: Self-pay | Admitting: Radiology

## 2014-05-21 DIAGNOSIS — Y839 Surgical procedure, unspecified as the cause of abnormal reaction of the patient, or of later complication, without mention of misadventure at the time of the procedure: Secondary | ICD-10-CM

## 2014-05-21 LAB — GLUCOSE, CAPILLARY
GLUCOSE-CAPILLARY: 118 mg/dL — AB (ref 65–99)
GLUCOSE-CAPILLARY: 190 mg/dL — AB (ref 65–99)
GLUCOSE-CAPILLARY: 119 mg/dL — AB (ref 65–99)
Glucose-Capillary: 121 mg/dL — ABNORMAL HIGH (ref 65–99)
Glucose-Capillary: 129 mg/dL — ABNORMAL HIGH (ref 65–99)
Glucose-Capillary: 132 mg/dL — ABNORMAL HIGH (ref 65–99)

## 2014-05-21 SURGERY — TOTAL HIP REVISION
Anesthesia: General | Laterality: Right

## 2014-05-21 MED ORDER — IOHEXOL 300 MG/ML  SOLN
100.0000 mL | Freq: Once | INTRAMUSCULAR | Status: AC | PRN
  Administered 2014-05-21: 100 mL via INTRAVENOUS

## 2014-05-21 NOTE — Progress Notes (Signed)
Patient ID: [name], male   DOB: 1935/06/13, 79 y.o.   MRN: [id number]    Referring Physician(s): [facility]  Subjective:  Pt without new c/o; more alert today, talkative; still with some rt hip discomfort but improved  Allergies: Allopurinol and Motrin  Medications: Prior to Admission medications   Medication Sig Start Date End Date Taking? Authorizing Provider  atenolol (TENORMIN) 50 MG tablet Take 50 mg by mouth daily. 04/16/14  Yes Historical Provider, MD  celecoxib (CELEBREX) 200 MG capsule Take 200 mg by mouth 2 (two) times daily. 02/26/14  Yes Historical Provider, MD  CRESTOR 10 MG tablet Take 10 mg by mouth daily. Takes one every other day 03/05/14  Yes Historical Provider, MD  furosemide (LASIX) 20 MG tablet Take 20 mg by mouth daily. 02/21/14  Yes Historical Provider, MD  LYRICA 75 MG capsule Take 75 mg by mouth 2 (two) times daily. 75mg  in the morning and 150mg  at night 04/27/14  Yes Historical Provider, MD  metFORMIN (GLUCOPHAGE) 500 MG tablet Take 1,000 mg by mouth. 02/24/14  Yes Historical Provider, MD  traMADol (ULTRAM) 50 MG tablet Take 100 mg by mouth. Every 4 hours 04/10/14  Yes Historical Provider, MD  ULORIC 80 MG TABS Take 1 tablet by mouth daily. 02/22/14  Yes Historical Provider, MD     Vital Signs: BP 147/79 mmHg  Pulse 106  Temp(Src) 97.7 F (36.5 C) (Oral)  Resp 20  Ht 5\' 7"  (1.702 m)  Wt 226 lb 13.7 oz (102.9 kg)  BMI 35.52 kg/m2  SpO2 99%  Physical Exam RLQ drain intact, output 30 cc's turbid, red fluid; cx's- few group D strept; insertion site ok, NT  Imaging: Dg Chest Port 1 View  05/18/2014   CLINICAL DATA:  PICC line placement.  EXAM: PORTABLE CHEST - 1 VIEW  COMPARISON:  May 11, 2014.  FINDINGS: Stable cardiomediastinal silhouette. No pneumothorax or pleural effusion is noted. Interval placement of right-sided PICC line with distal tip overlying expected position of the SVC. Both lungs are clear. Status post bilateral shoulder arthroplasty.  IMPRESSION:  Interval placement of right-sided PICC line with distal tip overlying expected position of the SVC.   Electronically Signed   By: Marijo Conception, M.D.   On: 05/18/2014 18:32    Labs:  CBC:  Recent Labs  05/14/14 0400 05/15/14 0350 05/16/14 0408 05/20/14 1121  WBC 14.1* 15.1* 12.7* 7.4  HGB 13.5 12.5* 13.0 12.9*  HCT 40.5 37.4* 40.0 39.0  PLT 377 344 322 299    COAGS:  Recent Labs  05/05/14 0055 05/20/14 1121  INR 1.18 1.27  APTT 32  --     BMP:  Recent Labs  05/14/14 0400 05/15/14 0350 05/16/14 0408 05/20/14 1121  NA 144 140 138 134*  K 4.4 4.1 4.2 4.0  CL 115* 113* 109 104  CO2 19* 19* 18* 19*  GLUCOSE 139* 142* 154* 144*  BUN 14 18 15 16   CALCIUM 9.1 8.9 9.1 8.5*  CREATININE 0.77 0.76 0.67 0.69  GFRNONAA >60 >60 >60 >60  GFRAA >60 >60 >60 >60    LIVER FUNCTION TESTS:  Recent Labs  05/05/14 0500 05/12/14 0355 05/15/14 0350 05/20/14 1121  BILITOT 1.8* 1.3* 0.8 0.7  AST 167* 50* 53* 44*  ALT 111* 54 45 39  ALKPHOS 120* 115 111 107  PROT 7.4 6.6 6.4* 6.2*  ALBUMIN 3.5 3.0* 2.5* 2.3*    Assessment and Plan: S/p rt iliopsoas abscess drainage 5/11; afebrile; last WBC nl; cont  current tx; plans as per ortho   Signed: D. Rowe Robert 05/21/2014, 2:16 PM   I spent a total of 15 MINUTES in face to face in clinical consultation/evaluation, greater than 50% of which was counseling/coordinating care for right iliopsoas abscess drain

## 2014-05-21 NOTE — Clinical Social Work Placement (Signed)
   CLINICAL SOCIAL WORK PLACEMENT  NOTE  Date:  05/21/2014  Patient Details  Name: [name] MRN: [id number] Date of Birth: 1935-11-14  Clinical Social Work is seeking post-discharge placement for this patient at the [place] level of care (*CSW will initial, date and re-position this form in  chart as items are completed):  Yes   Patient/family provided with [place] Work Department's list of facilities offering this level of care within the geographic area requested by the patient (or if unable, by the patient's family).  Yes   Patient/family informed of their freedom to choose among providers that offer the needed level of care, that participate in Medicare, Medicaid or managed care program needed by the patient, have an available bed and are willing to accept the patient.  Yes   Patient/family informed of [facility]'s ownership interest in [facility] and [facility], as well as of the fact that they are under no obligation to receive care at these facilities.  PASRR submitted to EDS on 05/21/14     PASRR number received on 05/21/14     Existing PASRR number confirmed on       FL2 transmitted to all facilities in geographic area requested by pt/family on 05/21/14     FL2 transmitted to all facilities within larger geographic area on       Patient informed that his/her managed care company has contracts with or will negotiate with certain facilities, including the following:        Yes   Patient/family informed of bed offers received.  Patient chooses bed at       Physician recommends and patient chooses bed at      Patient to be transferred to   on  .  Patient to be transferred to facility by       Patient family notified on   of transfer.  Name of family member notified:        PHYSICIAN       Additional Comment:    _______________________________________________ Standley Brooking, LCSW 05/21/2014, 12:55 PM

## 2014-05-21 NOTE — Progress Notes (Signed)
Speech Language Pathology Treatment: Dysphagia  Patient Details Name: [name] MRN: [id number] DOB: 1935/08/21 Today's Date: 05/21/2014 Time: 1150-1200 SLP Time Calculation (min) (ACUTE ONLY): 10 min  Assessment / Plan / Recommendation Clinical Impression  Family/pt education re: swallowing safety and precautions.  Pt reports he has been eating "too much" although intake documented is 10%.  Daughter reports pt with poor intake- even of foods brought in from outside.  Pt's poor dentition likely impacts his mastication abilities (few lower teeth) and his daughter reports she would like for him to have them extracted (at the same time) if pt is to have a surgical procedure. Pt denies shortness of breath and states he is doing well with soft foods.  Pt is agreeable to continue diet due to his acute illness. Expiratory pursed lip breathing noted which pt attributes to "trying to get warm"- denying dyspnea.    Educated daughter/pt to recommendations to maximize airway protection with intake.  Dysphagia has resolved during this acute setting due to improved medical/mental status.  SLP to sign off as all education completed.        HPI Other Pertinent Information: 79 yo male adm to [facility] with AMS, found to be septic - pt hospital course complicated by his lethargy.  Found to have a small SDH.  PMH + for DM, ETOH abuse.  Swallow evaluation ordered.  CT chest showed mild ATX and mild effusion.     Pertinent Vitals Pain Assessment: Faces Pain Score: 4  Pain Location: butt, pt wants to go back to bed Pain Intervention(s): Monitored during session;Other (comment) (assisted pt to use call bell to call for assist)  SLP Plan  All goals met    Recommendations Diet recommendations: Dysphagia 3 (mechanical soft);Thin liquid Liquids provided via: Straw;Cup Medication Administration: Whole meds with liquid (as tolerated) Supervision: Patient able to self feed Compensations: Small sips/bites;Slow rate Postural  Changes and/or Swallow Maneuvers: Seated upright 90 degrees;Upright 30-60 min after meal              Oral Care Recommendations: Oral care BID Plan: All goals met    IXL, [place] [facility] SLP [phone]

## 2014-05-21 NOTE — Clinical Social Work Note (Signed)
Clinical Social Work Assessment  Patient Details  Name: [name] MRN: [id number] Date of Birth: [birth year]  Date of referral:  05/21/14               Reason for consult:  Facility Placement                Permission sought to share information with:  Chartered certified accountant granted to share information::  Yes, Verbal Permission Granted  Name::        Agency::     Relationship::     Contact Information:     Housing/Transportation Living arrangements for the past 2 months:  Single Family Home Source of Information:  Patient, Adult Children Patient Interpreter Needed:  None Criminal Activity/Legal Involvement Pertinent to Current Situation/Hospitalization:    Significant Relationships:  Adult Children Lives with:  Adult Children Do you feel safe going back to the place where you live?  No Need for family participation in patient care:  Yes (Comment)  Care giving concerns:  CSW reviewed PT evaluation recommending SNF at discharge.    Social Worker assessment / plan:  CSW spoke with patient, daughter [name] & son-in-law, Quillian Quince at bedside re: d/c plan.   Employment status:  Retired Nurse, adult PT Recommendations:  [place] / Referral to community resources:  [place]  Patient/Family's Response to care:  Patient & daughter are agreeable with plan for SNF - will provide SNF bed offers when available. CSW provided SNF list to daughter who states she will talk to some of her friends who have gone through this with their parents.   Patient/Family's Understanding of and Emotional Response to Diagnosis, Current Treatment, and Prognosis:  Patient is currently refusing surgery, daughter is not pushing for it either.   Emotional Assessment Appearance:  Appears stated age Attitude/Demeanor/Rapport:    Affect (typically observed):  Pleasant Orientation:  Oriented to Self, Oriented to Place,  Oriented to  Time, Oriented to Situation Alcohol / Substance use:    Psych involvement (Current and /or in the community):  No (Comment)  Discharge Needs  Concerns to be addressed:  Discharge Planning Concerns Readmission within the last 30 days:  No Current discharge risk:    Barriers to Discharge:      Standley Brooking, LCSW 05/21/2014, 12:52 PM

## 2014-05-21 NOTE — Progress Notes (Addendum)
[facility] PROGRESS NOTE Interim History: 79 year old male with past medical history of chronic alcoholism, diabetes mellitus and hypertension who presented to emergency room on 4/29 because of right hip and right lower quadrant abdominal pain that had been ongoing for a week along with associated fevers and chills. CT of abdomen and pelvis noted possible iliopsoas abscess as well as new onset atrial fibrillation by EKG. Blood cultures positive for strep bovis. Iliopsoas muscle aspirated with cultures consistent with strep viridans. Infectious disease consulted for persistent fevers. Given patient's confusion, MRI done noting subdural hematoma and small CVA. Neurology consulted. Unable to place on antiplatelet therapy secondary to subdural hematoma. Initially on Cardizem drip , transitioned to by mouth and although chads 2 score at 4, because of compliance and chronic alcoholism, felt to be high risk for anticoagulation, especially in the setting of subdural. Over the course of the past 24 hours, patient has become more alert and interactive. He himself has no complaints   Assessment/Plan: Sepsis due to right iliopsoas abscess and septic arthritis: - Unsuccessful drain placement of the right iliopsoas abscess 05/06/2014  and successfully repeated on 05/15/2014. - Cont to drain purulent material.  He will need a repeated CT scan to evaluate abscess before taking drain out. - Status post right hip aspiration on 05/16/2014 with purulent materials cultures grew strep group D. - Repeat blood culture on 05/08/2014 remained negative. - Family d/w with orthopedic surgeon that they would like to think about right hip replacement. - Antibiotic vancomycin and Rocephin. Has remained afebrile. - Will need antibiotics for 6 weeks. - Repeat Ct abd and pelvis.  Strep coccus group D bacteremia: - Blood cultures positive on 05/04/2014. - Continue Rocephin.  New onset atrial fibrillation: - Thought to  be due to infectious etiology, started on Cardizem drip, TSH normal to the echo showed a preserved ejection fraction with mild mitral and tricuspid regurgitation. - HR cont to be > 100 Increase oral cardizem. - not a candidate for anticoagulation due to bilateral subdural hematoma.  Controlled diabetes mellitus type 2: Continue sliding scale insulin DC D5 half-normal saline.  Essential hypertension: - Blood pressure has been rising steadily, continue to monitor.  Alcohol abuse: - He was monitored with CIWA protocol.  - His withdrawal symptoms resolved. Avoid ativan.  Acute encephalopathy/Acute confusional state: - Likely multifactorial due to subdural hematoma and infectious etiology and/or alcohol withdrawal.  - now back to baseline. - Will use haldol PRN.  Subdural hematoma: - MRI of the head  05/11/2014 show subdural hematoma on the left MCA territory. - Neurology consulted and recommended no interventions at this time and will follow along.   Code Status: Full code Family Communication: Left message with daughter Disposition Plan: 3-4 days  Consultants:  Infectious disease  Interventional radiology  Neurology  Critical care  Orthopedics  Cardiology  General surgery  Procedures:  Attempted aspiration of iliopsoas muscle on 5/2: Unsuccessful   Antibiotics: IV vancomycin 4/29-5/6 IV Zosyn 4/29-5/4 IV Rocephin 5/4-present  HPI/Subjective: No complains  Objective: Filed Vitals:   05/20/14 1834 05/20/14 2026 05/21/14 0253 05/21/14 0500  BP: 150/79 138/64 145/67 135/65  Pulse:  90  90  Temp:  98.1 F (36.7 C)  97.7 F (36.5 C)  TempSrc:  Axillary  Oral  Resp:  18  18  Height:      Weight:    102.9 kg (226 lb 13.7 oz)  SpO2:  95%  98%    Intake/Output Summary (Last 24 hours) at 05/21/14 1022  Last data filed at 05/21/14 1000  Gross per 24 hour  Intake    220 ml  Output    705 ml  Net   -485 ml   Filed Weights   05/19/14 0435 05/20/14 0440  05/21/14 0500  Weight: 102.9 kg (226 lb 13.7 oz) 102.74 kg (226 lb 8 oz) 102.9 kg (226 lb 13.7 oz)    Exam:  General: Alert, awake, oriented x3, in no acute distress.  HEENT: No bruits, no goiter.  Heart: Regular rate and rhythm. Lungs: Good air movement, clear Abdomen: Soft, nontender, nondistended, positive bowel sounds.  Neuro: Grossly intact, nonfocal.   Data Reviewed: Basic Metabolic Panel:  Recent Labs Lab 05/15/14 0350 05/16/14 0408 05/20/14 1121  NA 140 138 134*  K 4.1 4.2 4.0  CL 113* 109 104  CO2 19* 18* 19*  GLUCOSE 142* 154* 144*  BUN 18 15 16   CREATININE 0.76 0.67 0.69  CALCIUM 8.9 9.1 8.5*  MG 1.9 1.6*  --    Liver Function Tests:  Recent Labs Lab 05/15/14 0350 05/20/14 1121  AST 53* 44*  ALT 45 39  ALKPHOS 111 107  BILITOT 0.8 0.7  PROT 6.4* 6.2*  ALBUMIN 2.5* 2.3*   No results for input(s): LIPASE, AMYLASE in the last 168 hours. No results for input(s): AMMONIA in the last 168 hours. CBC:  Recent Labs Lab 05/15/14 0350 05/16/14 0408 05/20/14 1121  WBC 15.1* 12.7* 7.4  HGB 12.5* 13.0 12.9*  HCT 37.4* 40.0 39.0  MCV 97.7 97.3 97.0  PLT 344 322 299   Cardiac Enzymes: No results for input(s): CKTOTAL, CKMB, CKMBINDEX, TROPONINI in the last 168 hours. BNP (last 3 results) No results for input(s): BNP in the last 8760 hours.  ProBNP (last 3 results) No results for input(s): PROBNP in the last 8760 hours.  CBG:  Recent Labs Lab 05/20/14 1649 05/20/14 2025 05/20/14 2347 05/21/14 0504 05/21/14 0812  GLUCAP 118* 135* 121* 118* 132*    Recent Results (from the past 240 hour(s))  Body fluid culture     Status: None   Collection Time: 05/16/14  6:21 PM  Result Value Ref Range Status   Specimen Description SYNOVIAL RIGHT HIP  Final   Special Requests Normal  Final   Gram Stain   Final    ABUNDANT WBC PRESENT, PREDOMINANTLY PMN NO ORGANISMS SEEN Performed at [facility]    Culture   Final    FEW STREPTOCOCCUS GROUP  D;high probability for S.bovis Note: CALLED TO KATIE BEESON 05/20/14 0800 BY SMITHERSJ Performed at [facility]    Report Status 05/20/2014 FINAL  Final   Organism ID, Bacteria STREPTOCOCCUS GROUP D;high probability for S.bovis  Final      Susceptibility   Streptococcus group d;high probability for s.bovis - MIC (ETEST)*    PENICILLIN .047 SENSITIVE Sensitive     * FEW STREPTOCOCCUS GROUP D;high probability for S.bovis  Body fluid culture     Status: None   Collection Time: 05/16/14  6:21 PM  Result Value Ref Range Status   Specimen Description ABSCESS RIGHT  Final   Special Requests Normal  Final   Gram Stain   Final    ABUNDANT WBC PRESENT,BOTH PMN AND MONONUCLEAR NO SQUAMOUS EPITHELIAL CELLS SEEN NO ORGANISMS SEEN Performed at [facility]    Culture   Final    FEW MICROAEROPHILIC STREPTOCOCCI Note: Standardized susceptibility testing for this organism is not available. Performed at [facility]    Report Status  05/20/2014 FINAL  Final     Studies: No results found.  Scheduled Meds: . antiseptic oral rinse  7 mL Mouth Rinse BID  . cefTRIAXone (ROCEPHIN)  IV  2 g Intravenous Q24H  . diltiazem  360 mg Oral Daily  . febuxostat  80 mg Oral Daily  . feeding supplement (ENSURE ENLIVE)  237 mL Oral BID BM  . feeding supplement (PRO-STAT SUGAR FREE 64)  30 mL Per Tube BID  . feeding supplement (RESOURCE BREEZE)  1 Container Oral TID BM  . folic acid  1 mg Intravenous Daily   Or  . folic acid  1 mg Oral Daily  . guaiFENesin  1,200 mg Oral BID  . hydrALAZINE  10 mg Intravenous Q4H  . insulin aspart  0-9 Units Subcutaneous Q4H  . multivitamin with minerals  1 tablet Oral Daily  . polyethylene glycol  17 g Oral Daily  . thiamine  100 mg Oral Daily   Or  . thiamine  100 mg Intravenous Daily   Continuous Infusions: . feeding supplement (GLUCERNA 1.2 CAL) Stopped (05/11/14 1830)    Time Spent: 25 min   FELIZ Marguarite Arbour  [facility] Pager [phone]. If 7PM-7AM, please contact night-coverage at [email or web address], password [facility] 05/21/2014, 10:22 AM  LOS: 17 days

## 2014-05-21 NOTE — Evaluation (Addendum)
Physical Therapy Evaluation Patient Details Name: [name] MRN: [id number] DOB: 1935/11/22 Today's Date: 05/21/2014   History of Present Illness  [name] is a 79 y.o. male with history of diabetes mellitus type 2, chronic alcoholism, hypertension, gout presents to the ER because of worsening pain in the right hip and right lower quadrant. Dx of sepsis, R psoas abscess, has drain, subdural hematoma, small CVA. Pt refusing removal of R THA.   Clinical Impression  Pt admitted with above diagnosis. Pt currently with functional limitations due to the deficits listed below (see PT Problem List). +2 total assist for supine to sit and sit to stand, pt unable to stand safely with RW and +2 assist so used mechanical lift for bed to recliner transfer.  Noted in progress notes pt has been telling MDs he's been walking in the room which is not correct. Pt is oriented but has times where he makes statements completely unrelated to the situation and doesn't demonstrate understanding of his limitations with mobility. SNF recommended.  Pt will benefit from skilled PT to increase their independence and safety with mobility to allow discharge to the venue listed below.       Follow Up Recommendations SNF;Supervision/Assistance - 24 hour    Equipment Recommendations  Other (comment) (to be determined)    Recommendations for Other Services OT consult     Precautions / Restrictions Precautions Precautions: Fall Precaution Comments: R hip drain Restrictions Weight Bearing Restrictions: No      Mobility  Bed Mobility Overal bed mobility: +2 for physical assistance;+ 2 for safety/equipment;Needs Assistance Bed Mobility: Supine to Sit;Sit to Supine     Supine to sit: +2 for physical assistance;Total assist Sit to supine: +2 for physical assistance;Total assist  Rolling: +2 total assist for rolling L and R, x 3 each direction for dressing changes and placement of lift pad    General bed mobility  comments: +2 to raise trunk and advance BLEs, pt 25%  Transfers Overall transfer level: Needs assistance Equipment used: Rolling walker (2 wheeled) Transfers: Sit to/from Stand Sit to Stand: +2 physical assistance;Max assist         General transfer comment: +2 for sit to stand, assist to rise, pt unable to stand fully erect; used mechanical lift for bed to recliner transfer  Ambulation/Gait                Stairs            Wheelchair Mobility    Modified Rankin (Stroke Patients Only)       Balance Overall balance assessment: Modified Independent;Needs assistance (sat on EOB x 10 minutes, feet supported)   Sitting balance-Leahy Scale: Fair                                       Pertinent Vitals/Pain Pain Assessment: No/denies pain    Home Living Family/patient expects to be discharged to:: Skilled nursing facility Living Arrangements: Children (lives with daughter) Available Help at Discharge: Family           Home Equipment: Gilford Rile - 2 wheels;Cane - single point      Prior Function Level of Independence: Independent with assistive device(s)         Comments: pt stated he walked with cane independently     Hand Dominance        Extremity/Trunk Assessment   Upper Extremity Assessment:  Generalized weakness           Lower Extremity Assessment: RLE deficits/detail;LLE deficits/detail RLE Deficits / Details: h/o TKA, R knee flexion limited to approx 50*, knee extension +3/5, edema and redness R lower leg, ankle PF/DF+3/5, Hip AAROM decr 50%; drain R hip LLE Deficits / Details: h/o TKA, knee flexion limited to approx 75*, knee extension -4/5     Communication   Communication: No difficulties  Cognition Arousal/Alertness: Awake/alert Behavior During Therapy: WFL for tasks assessed/performed Overall Cognitive Status: Impaired/Different from baseline Area of Impairment: Safety/judgement;Problem solving                General Comments: pt oriented to self, place, date, situation however made several inconsistent comments such as, "I'll vote for you." and "I'll sing at your wedding." when I introduced myself. Pt also not realistic about current mobility status. He stated he could walk, however requires +2 total assist for supine to sit and sit to stand.     General Comments      Exercises        Assessment/Plan    PT Assessment Patient needs continued PT services  PT Diagnosis Difficulty walking;Generalized weakness;Altered mental status   PT Problem List Decreased strength;Decreased range of motion;Decreased activity tolerance;Decreased mobility;Decreased balance;Decreased cognition;Decreased safety awareness  PT Treatment Interventions Gait training;Functional mobility training;Therapeutic activities;Patient/family education;Therapeutic exercise;Balance training   PT Goals (Current goals can be found in the Care Plan section) Acute Rehab PT Goals Patient Stated Goal: to walk PT Goal Formulation: With patient Time For Goal Achievement: 06/04/14 Potential to Achieve Goals: Fair    Frequency Min 3X/week   Barriers to discharge        Co-evaluation               End of Session   Activity Tolerance: Patient tolerated treatment well Patient left: in chair;with call bell/phone within reach;with chair alarm set Nurse Communication: Mobility status;Need for lift equipment         Time: 0923-1021 PT Time Calculation (min) (ACUTE ONLY): 58 min   Charges:   PT Evaluation $Initial PT Evaluation Tier I: 1 Procedure PT Treatments $Therapeutic Activity: 38-52 mins   PT G Codes:        Philomena Doheny 05/21/2014, 10:41 AM [phone]

## 2014-05-21 NOTE — Progress Notes (Signed)
[facility]  Date of Admission:  05/04/2014  Antibiotics: ceftriaxone  Subjective: Feels good, walking  Objective: Temp:  [97.7 F (36.5 C)-98.5 F (36.9 C)] 97.7 F (36.5 C) (05/16 0500) Pulse Rate:  [90-96] 90 (05/16 0500) Resp:  [18] 18 (05/16 0500) BP: (135-150)/(64-81) 135/65 mmHg (05/16 0500) SpO2:  [95 %-98 %] 98 % (05/16 0500) Weight:  [226 lb 13.7 oz (102.9 kg)] 226 lb 13.7 oz (102.9 kg) (05/16 0500)  General: Awake, alert, nad Skin: no rashes Lungs: CTA B Cor: RRR Neuro: alert, oriented to place, time, person  Lab Results Lab Results  Component Value Date   WBC 7.4 05/20/2014   HGB 12.9* 05/20/2014   HCT 39.0 05/20/2014   MCV 97.0 05/20/2014   PLT 299 05/20/2014    Lab Results  Component Value Date   CREATININE 0.69 05/20/2014   BUN 16 05/20/2014   NA 134* 05/20/2014   K 4.0 05/20/2014   CL 104 05/20/2014   CO2 19* 05/20/2014    Lab Results  Component Value Date   ALT 39 05/20/2014   AST 44* 05/20/2014   ALKPHOS 107 05/20/2014   BILITOT 0.7 05/20/2014      Microbiology: Recent Results (from the past 240 hour(s))  Body fluid culture     Status: None   Collection Time: 05/16/14  6:21 PM  Result Value Ref Range Status   Specimen Description SYNOVIAL RIGHT HIP  Final   Special Requests Normal  Final   Gram Stain   Final    ABUNDANT WBC PRESENT, PREDOMINANTLY PMN NO ORGANISMS SEEN Performed at [facility]    Culture   Final    FEW STREPTOCOCCUS GROUP D;high probability for S.bovis Note: CALLED TO KATIE BEESON 05/20/14 0800 BY SMITHERSJ Performed at [facility]    Report Status 05/20/2014 FINAL  Final   Organism ID, Bacteria STREPTOCOCCUS GROUP D;high probability for S.bovis  Final      Susceptibility   Streptococcus group d;high probability for s.bovis - MIC (ETEST)*    PENICILLIN .047 SENSITIVE Sensitive     * FEW STREPTOCOCCUS GROUP D;high probability for S.bovis  Body fluid culture      Status: None   Collection Time: 05/16/14  6:21 PM  Result Value Ref Range Status   Specimen Description ABSCESS RIGHT  Final   Special Requests Normal  Final   Gram Stain   Final    ABUNDANT WBC PRESENT,BOTH PMN AND MONONUCLEAR NO SQUAMOUS EPITHELIAL CELLS SEEN NO ORGANISMS SEEN Performed at [facility]    Culture   Final    FEW MICROAEROPHILIC STREPTOCOCCI Note: Standardized susceptibility testing for this organism is not available. Performed at [facility]    Report Status 05/20/2014 FINAL  Final    Studies/Results: No results found.  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 Luiz Blare noted.  I also expressed my concern of him keeping the infected hip.  I discussed risks of continuing infection and possible overwhelming infection.  He continues to refuse.   Will need at least 6 weeks of IV ceftriaxone and can then convert to oral amoxicillin if drain is out and he is in recovery phase.  May consider lifetime suppression if things go well, though I think is unlikely.   Will do baseline ESR and CRP tomorrow.  Staci Righter, MD [email or web address] [phone] pager   [phone] cell 05/21/2014, 9:29  AM

## 2014-05-22 DIAGNOSIS — M00251 Other streptococcal arthritis, right hip: Secondary | ICD-10-CM

## 2014-05-22 LAB — GLUCOSE, CAPILLARY
GLUCOSE-CAPILLARY: 119 mg/dL — AB (ref 65–99)
GLUCOSE-CAPILLARY: 127 mg/dL — AB (ref 65–99)
Glucose-Capillary: 119 mg/dL — ABNORMAL HIGH (ref 65–99)
Glucose-Capillary: 142 mg/dL — ABNORMAL HIGH (ref 65–99)
Glucose-Capillary: 142 mg/dL — ABNORMAL HIGH (ref 65–99)
Glucose-Capillary: 149 mg/dL — ABNORMAL HIGH (ref 65–99)

## 2014-05-22 LAB — C-REACTIVE PROTEIN: CRP: 1.1 mg/dL — ABNORMAL HIGH (ref <1.0)

## 2014-05-22 LAB — SEDIMENTATION RATE: SED RATE: 55 mm/h — AB (ref 0–16)

## 2014-05-22 MED ORDER — SACCHAROMYCES BOULARDII 250 MG PO CAPS
250.0000 mg | ORAL_CAPSULE | Freq: Two times a day (BID) | ORAL | Status: DC
  Administered 2014-05-22 – 2014-05-24 (×4): 250 mg via ORAL
  Filled 2014-05-22 (×5): qty 1

## 2014-05-22 NOTE — Progress Notes (Addendum)
[facility] PROGRESS NOTE Interim History: 79 year old male with past medical history of chronic alcoholism, diabetes mellitus and hypertension who presented to emergency room on 4/29 because of right hip and right lower quadrant abdominal pain that had been ongoing for a week along with associated fevers and chills. CT of abdomen and pelvis noted possible iliopsoas abscess as well as new onset atrial fibrillation by EKG, cardiology consulted and echo done. Blood cultures positive for strep bovis. Iliopsoas muscle aspirated with cultures consistent with strep viridans. Infectious disease consulted for persistent fevers. Given patient's confusion, MRI done 5.6.2016 noting subdural hematoma and small CVA. Neurology consulted. Unable to place on antiplatelet therapy secondary to subdural hematoma. Initially on Cardizem drip , transitioned to by mouth and although chads 2 score at 4, because of compliance and chronic alcoholism, felt to be high risk for anticoagulation, especially in the setting of subdural. Over the course of the past 24 hours, patient has become more alert and interactive. I spoke with the daughter as sometimes pt is confabulating and/or delirious.   Assessment/Plan: Sepsis due to right iliopsoas abscess and septic arthritis: - Unsuccessful drain placement of the right iliopsoas abscess 05/06/2014  and successfully repeated on 05/15/2014. - Cont to drain purulent material.  Repeated Ct abd and pelvis showed improved abscess, output cont to be between 80 & 100 cc, awaiting IR recommendations, has a fluid collection anterior to the drain. - Status post right hip aspiration on 05/16/2014 with purulent materials cultures grew strep group D. - Repeat blood culture on 05/08/2014 remained negative. Inserted PICC on 5.12.2016  - Family d/w with orthopedic surgeon that they would like conservative measurement and re-evaluate as an outpatient. - On Antibiotic vancomycin and Rocephin. Has  remained afebrile. - Will need to cont antibiotics indefinitely. And he will then be at high of c. Dif. Held PPI started florastor. 6 weeks of current IV therapy, then oral amoxicillin. - CPR elevated.  Strep coccus group D bacteremia: - Blood cultures positive on 05/04/2014. - Continue Rocephin.  New onset atrial fibrillation: - Thought to be due to infectious etiology, started on Cardizem drip, TSH normal to the echo showed a preserved ejection fraction with mild mitral and tricuspid regurgitation. - HR cont to be > 100 Increase oral cardizem. - not a candidate for anticoagulation due to bilateral subdural hematoma, compliance and chronic ETOH abuse.  Controlled diabetes mellitus type 2: Continue sliding scale insulin DC D5 half-normal saline.  Essential hypertension: - Blood pressure has been rising steadily, continue to monitor.  Alcohol abuse: - He was monitored with CIWA protocol.  - His withdrawal symptoms resolved. Avoid ativan.  Acute encephalopathy/Acute confusional state: - Likely multifactorial due to subdural hematoma and infectious etiology and/or alcohol withdrawal.  - Develop acute confucional state on 5.16.2016 started haldol with some improvement, cont haldol PRN. - ? If he has a degree of Wernicke/korsacoff encephalopathy  Subdural hematoma: - MRI of the head  05/11/2014 show subdural hematoma on the left MCA territory. - Neurology consulted and recommended no interventions at this time.   Code Status: Full code Family Communication: Left message with daughter Disposition Plan: 3-4 days  Consultants:  Infectious disease  Interventional radiology  Neurology  Critical care  Orthopedics  Cardiology  General surgery  Procedures:  Attempted aspiration of iliopsoas muscle on 5/2: Unsuccessful   Antibiotics: IV vancomycin 4/29-5/6 IV Zosyn 4/29-5/4 IV Rocephin 5/4-present  HPI/Subjective: No complains.  Objective: Filed Vitals:    05/21/14 1345 05/21/14 2007 05/22/14 0026 05/22/14  0500  BP: 147/79 151/76 114/53 126/70  Pulse: 106 91 104 83  Temp: 97.7 F (36.5 C) 98.4 F (36.9 C) 97.7 F (36.5 C) 99.7 F (37.6 C)  TempSrc: Oral Oral Oral Oral  Resp: 20 20 20 20   Height:      Weight:    102.1 kg (225 lb 1.4 oz)  SpO2: 99% 97% 98% 95%    Intake/Output Summary (Last 24 hours) at 05/22/14 1148 Last data filed at 05/22/14 0824  Gross per 24 hour  Intake    120 ml  Output    825 ml  Net   -705 ml   Filed Weights   05/20/14 0440 05/21/14 0500 05/22/14 0500  Weight: 102.74 kg (226 lb 8 oz) 102.9 kg (226 lb 13.7 oz) 102.1 kg (225 lb 1.4 oz)    Exam:  General: Alert, awake, oriented x3, in no acute distress.  HEENT: No bruits, no goiter.  Heart: Regular rate and rhythm. Lungs: Good air movement, clear Abdomen: Soft, nontender, nondistended, positive bowel sounds.  Neuro: Grossly intact, nonfocal.   Data Reviewed: Basic Metabolic Panel:  Recent Labs Lab 05/16/14 0408 05/20/14 1121  NA 138 134*  K 4.2 4.0  CL 109 104  CO2 18* 19*  GLUCOSE 154* 144*  BUN 15 16  CREATININE 0.67 0.69  CALCIUM 9.1 8.5*  MG 1.6*  --    Liver Function Tests:  Recent Labs Lab 05/20/14 1121  AST 44*  ALT 39  ALKPHOS 107  BILITOT 0.7  PROT 6.2*  ALBUMIN 2.3*   No results for input(s): LIPASE, AMYLASE in the last 168 hours. No results for input(s): AMMONIA in the last 168 hours. CBC:  Recent Labs Lab 05/16/14 0408 05/20/14 1121  WBC 12.7* 7.4  HGB 13.0 12.9*  HCT 40.0 39.0  MCV 97.3 97.0  PLT 322 299   Cardiac Enzymes: No results for input(s): CKTOTAL, CKMB, CKMBINDEX, TROPONINI in the last 168 hours. BNP (last 3 results) No results for input(s): BNP in the last 8760 hours.  ProBNP (last 3 results) No results for input(s): PROBNP in the last 8760 hours.  CBG:  Recent Labs Lab 05/21/14 2004 05/22/14 0022 05/22/14 0514 05/22/14 0731 05/22/14 1143  GLUCAP 129* 142* 119* 119* 149*     Recent Results (from the past 240 hour(s))  Body fluid culture     Status: None   Collection Time: 05/16/14  6:21 PM  Result Value Ref Range Status   Specimen Description SYNOVIAL RIGHT HIP  Final   Special Requests Normal  Final   Gram Stain   Final    ABUNDANT WBC PRESENT, PREDOMINANTLY PMN NO ORGANISMS SEEN Performed at [facility]    Culture   Final    FEW STREPTOCOCCUS GROUP D;high probability for S.bovis Note: CALLED TO KATIE BEESON 05/20/14 0800 BY SMITHERSJ Performed at [facility]    Report Status 05/20/2014 FINAL  Final   Organism ID, Bacteria STREPTOCOCCUS GROUP D;high probability for S.bovis  Final      Susceptibility   Streptococcus group d;high probability for s.bovis - MIC (ETEST)*    PENICILLIN .047 SENSITIVE Sensitive     * FEW STREPTOCOCCUS GROUP D;high probability for S.bovis  Body fluid culture     Status: None   Collection Time: 05/16/14  6:21 PM  Result Value Ref Range Status   Specimen Description ABSCESS RIGHT  Final   Special Requests Normal  Final   Gram Stain   Final    ABUNDANT WBC  PRESENT,BOTH PMN AND MONONUCLEAR NO SQUAMOUS EPITHELIAL CELLS SEEN NO ORGANISMS SEEN Performed at [facility]    Culture   Final    FEW MICROAEROPHILIC STREPTOCOCCI Note: Standardized susceptibility testing for this organism is not available. Performed at [facility]    Report Status 05/20/2014 FINAL  Final     Studies: Ct Abdomen Pelvis W Contrast  05/21/2014   CLINICAL DATA:  RIGHT psoas abscess. RIGHT hip pain. Follow-up. Drainage of the RIGHT psoas abscess. Evaluate for collection.  EXAM: CT ABDOMEN AND PELVIS WITH CONTRAST  TECHNIQUE: Multidetector CT imaging of the abdomen and pelvis was performed using the standard protocol following bolus administration of intravenous contrast.  CONTRAST:  140mL OMNIPAQUE IOHEXOL 300 MG/ML  SOLN  COMPARISON:  None.  FINDINGS: Dependent atelectasis in the lungs. Small volume of ascites  remains present. Atherosclerosis. Unchanged appearance of the liver and spleen. Small hiatal hernia. Stomach and gallbladder grossly appear within normal limits. There is no intra-abdominal free air. Dense aortoiliac atherosclerosis. Small and large bowel appears similar to prior exam. Small RIGHT iliopsoas abscess remains present although decreased in size when compared to the prior exam of 05/10/2014. This is draining through the pigtail drainage catheter anterior to the RIGHT hip. The abscess today measures 3.5 cm x 2.1 cm (image 72 series 2).  Review of the bone windows shows no evidence of discitis/osteomyelitis by CT in this patient with psoas abscess. Unchanged degenerative nitrogen gas in the sacroiliac joints.  IMPRESSION: 1. Pigtail drainage catheter is in the fluid collection anterior to the RIGHT hip, which probably communicates with the RIGHT hip arthroplasty. 2. Iliopsoas abscess has significantly improved compared to prior. Small residual iliopsoas abscess measures 35 mm x 21 mm (image 72 series 2). 3. Small volume ascites.   Electronically Signed   By: Dereck Ligas M.D.   On: 05/21/2014 15:42    Scheduled Meds: . antiseptic oral rinse  7 mL Mouth Rinse BID  . cefTRIAXone (ROCEPHIN)  IV  2 g Intravenous Q24H  . diltiazem  360 mg Oral Daily  . febuxostat  80 mg Oral Daily  . feeding supplement (ENSURE ENLIVE)  237 mL Oral BID BM  . feeding supplement (PRO-STAT SUGAR FREE 64)  30 mL Per Tube BID  . feeding supplement (RESOURCE BREEZE)  1 Container Oral TID BM  . folic acid  1 mg Intravenous Daily   Or  . folic acid  1 mg Oral Daily  . guaiFENesin  1,200 mg Oral BID  . hydrALAZINE  10 mg Intravenous Q4H  . insulin aspart  0-9 Units Subcutaneous Q4H  . multivitamin with minerals  1 tablet Oral Daily  . polyethylene glycol  17 g Oral Daily  . thiamine  100 mg Oral Daily   Or  . thiamine  100 mg Intravenous Daily   Continuous Infusions: . feeding supplement (GLUCERNA 1.2 CAL)  Stopped (05/11/14 1830)    Time Spent: 25 min   FELIZ Marguarite Arbour  [facility] Pager [phone]. If 7PM-7AM, please contact night-coverage at [email or web address], password [facility] 05/22/2014, 11:48 AM  LOS: 18 days

## 2014-05-22 NOTE — Plan of Care (Signed)
Problem: Phase III Progression Outcomes Goal: Voiding independently Outcome: Not Met (add Reason) Pt voids, inct ----condom cath in place.

## 2014-05-22 NOTE — Progress Notes (Signed)
[facility] for Infectious Disease  Date of Admission:  05/04/2014  Antibiotics: ceftriaxone  Subjective: No new issues  Objective: Temp:  [97.7 F (36.5 C)-99.7 F (37.6 C)] 98.7 F (37.1 C) (05/17 1350) Pulse Rate:  [83-104] 83 (05/17 1350) Resp:  [18-20] 18 (05/17 1350) BP: (114-151)/(53-76) 126/75 mmHg (05/17 1350) SpO2:  [95 %-98 %] 96 % (05/17 1350) Weight:  [225 lb 1.4 oz (102.1 kg)] 225 lb 1.4 oz (102.1 kg) (05/17 0500)  General: Awake, alert, nad Skin: no rashes Lungs: CTA B Cor: RRR   Lab Results Lab Results  Component Value Date   WBC 7.4 05/20/2014   HGB 12.9* 05/20/2014   HCT 39.0 05/20/2014   MCV 97.0 05/20/2014   PLT 299 05/20/2014    Lab Results  Component Value Date   CREATININE 0.69 05/20/2014   BUN 16 05/20/2014   NA 134* 05/20/2014   K 4.0 05/20/2014   CL 104 05/20/2014   CO2 19* 05/20/2014    Lab Results  Component Value Date   ALT 39 05/20/2014   AST 44* 05/20/2014   ALKPHOS 107 05/20/2014   BILITOT 0.7 05/20/2014      Microbiology: Recent Results (from the past 240 hour(s))  Body fluid culture     Status: None   Collection Time: 05/16/14  6:21 PM  Result Value Ref Range Status   Specimen Description SYNOVIAL RIGHT HIP  Final   Special Requests Normal  Final   Gram Stain   Final    ABUNDANT WBC PRESENT, PREDOMINANTLY PMN NO ORGANISMS SEEN Performed at [facility]    Culture   Final    FEW STREPTOCOCCUS GROUP D;high probability for S.bovis Note: CALLED TO KATIE BEESON 05/20/14 0800 BY SMITHERSJ Performed at [facility]    Report Status 05/20/2014 FINAL  Final   Organism ID, Bacteria STREPTOCOCCUS GROUP D;high probability for S.bovis  Final      Susceptibility   Streptococcus group d;high probability for s.bovis - MIC (ETEST)*    PENICILLIN .047 SENSITIVE Sensitive     * FEW STREPTOCOCCUS GROUP D;high probability for S.bovis  Body fluid culture     Status: None   Collection Time: 05/16/14  6:21 PM   Result Value Ref Range Status   Specimen Description ABSCESS RIGHT  Final   Special Requests Normal  Final   Gram Stain   Final    ABUNDANT WBC PRESENT,BOTH PMN AND MONONUCLEAR NO SQUAMOUS EPITHELIAL CELLS SEEN NO ORGANISMS SEEN Performed at [facility]    Culture   Final    FEW MICROAEROPHILIC STREPTOCOCCI Note: Standardized susceptibility testing for this organism is not available. Performed at [facility]    Report Status 05/20/2014 FINAL  Final    Studies/Results: Ct Abdomen Pelvis W Contrast  05/21/2014   CLINICAL DATA:  RIGHT psoas abscess. RIGHT hip pain. Follow-up. Drainage of the RIGHT psoas abscess. Evaluate for collection.  EXAM: CT ABDOMEN AND PELVIS WITH CONTRAST  TECHNIQUE: Multidetector CT imaging of the abdomen and pelvis was performed using the standard protocol following bolus administration of intravenous contrast.  CONTRAST:  157m OMNIPAQUE IOHEXOL 300 MG/ML  SOLN  COMPARISON:  None.  FINDINGS: Dependent atelectasis in the lungs. Small volume of ascites remains present. Atherosclerosis. Unchanged appearance of the liver and spleen. Small hiatal hernia. Stomach and gallbladder grossly appear within normal limits. There is no intra-abdominal free air. Dense aortoiliac atherosclerosis. Small and large bowel appears similar to prior exam. Small RIGHT iliopsoas abscess remains  present although decreased in size when compared to the prior exam of 05/10/2014. This is draining through the pigtail drainage catheter anterior to the RIGHT hip. The abscess today measures 3.5 cm x 2.1 cm (image 72 series 2).  Review of the bone windows shows no evidence of discitis/osteomyelitis by CT in this patient with psoas abscess. Unchanged degenerative nitrogen gas in the sacroiliac joints.  IMPRESSION: 1. Pigtail drainage catheter is in the fluid collection anterior to the RIGHT hip, which probably communicates with the RIGHT hip arthroplasty. 2. Iliopsoas abscess has  significantly improved compared to prior. Small residual iliopsoas abscess measures 35 mm x 21 mm (image 72 series 2). 3. Small volume ascites.   Electronically Signed   By: Dereck Ligas M.D.   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 Berenice Primas noted. Refuses definitive therapy. Will need at least 6 weeks of IV ceftriaxone and can then convert to oral amoxicillin if drain is out and he is in recovery phase.  May consider lifetime suppression if things go well, though I think this is unlikely.   ESR 55, CRP 1.1  Eura Radabaugh, [place] [facility] [email or web address] [phone] pager   [phone] cell 05/22/2014, 4:45 PM

## 2014-05-22 NOTE — Progress Notes (Signed)
Patient ID: [name], male   DOB: 06-Nov-1935, 79 y.o.   MRN: [id number]    Subjective:  Pt doing well today. No complaints.    Allergies: Allopurinol and Motrin  Medications: Prior to Admission medications   Medication Sig Start Date End Date Taking? Authorizing Provider  atenolol (TENORMIN) 50 MG tablet Take 50 mg by mouth daily. 04/16/14  Yes Historical Provider, MD  celecoxib (CELEBREX) 200 MG capsule Take 200 mg by mouth 2 (two) times daily. 02/26/14  Yes Historical Provider, MD  CRESTOR 10 MG tablet Take 10 mg by mouth daily. Takes one every other day 03/05/14  Yes Historical Provider, MD  furosemide (LASIX) 20 MG tablet Take 20 mg by mouth daily. 02/21/14  Yes Historical Provider, MD  LYRICA 75 MG capsule Take 75 mg by mouth 2 (two) times daily. 75mg  in the morning and 150mg  at night 04/27/14  Yes Historical Provider, MD  metFORMIN (GLUCOPHAGE) 500 MG tablet Take 1,000 mg by mouth. 02/24/14  Yes Historical Provider, MD  traMADol (ULTRAM) 50 MG tablet Take 100 mg by mouth. Every 4 hours 04/10/14  Yes Historical Provider, MD  ULORIC 80 MG TABS Take 1 tablet by mouth daily. 02/22/14  Yes Historical Provider, MD     Vital Signs: BP 126/70 mmHg  Pulse 83  Temp(Src) 99.7 F (37.6 C) (Oral)  Resp 20  Ht 5\' 7"  (1.702 m)  Wt 225 lb 1.4 oz (102.1 kg)  BMI 35.25 kg/m2  SpO2 95%  Physical Exam   Awake and Alert Right thigh drain in place. No erythema.  ~ 80 mls output, serous-blood tinged drainage.  Imaging: Ct Abdomen Pelvis W Contrast  05/21/2014   CLINICAL DATA:  RIGHT psoas abscess. RIGHT hip pain. Follow-up. Drainage of the RIGHT psoas abscess. Evaluate for collection.  EXAM: CT ABDOMEN AND PELVIS WITH CONTRAST  TECHNIQUE: Multidetector CT imaging of the abdomen and pelvis was performed using the standard protocol following bolus administration of intravenous contrast.  CONTRAST:  157mL OMNIPAQUE IOHEXOL 300 MG/ML  SOLN  COMPARISON:  None.  FINDINGS: Dependent atelectasis in the  lungs. Small volume of ascites remains present. Atherosclerosis. Unchanged appearance of the liver and spleen. Small hiatal hernia. Stomach and gallbladder grossly appear within normal limits. There is no intra-abdominal free air. Dense aortoiliac atherosclerosis. Small and large bowel appears similar to prior exam. Small RIGHT iliopsoas abscess remains present although decreased in size when compared to the prior exam of 05/10/2014. This is draining through the pigtail drainage catheter anterior to the RIGHT hip. The abscess today measures 3.5 cm x 2.1 cm (image 72 series 2).  Review of the bone windows shows no evidence of discitis/osteomyelitis by CT in this patient with psoas abscess. Unchanged degenerative nitrogen gas in the sacroiliac joints.  IMPRESSION: 1. Pigtail drainage catheter is in the fluid collection anterior to the RIGHT hip, which probably communicates with the RIGHT hip arthroplasty. 2. Iliopsoas abscess has significantly improved compared to prior. Small residual iliopsoas abscess measures 35 mm x 21 mm (image 72 series 2). 3. Small volume ascites.   Electronically Signed   By: Dereck Ligas M.D.   On: 05/21/2014 15:42   Dg Chest Port 1 View  05/18/2014   CLINICAL DATA:  PICC line placement.  EXAM: PORTABLE CHEST - 1 VIEW  COMPARISON:  May 11, 2014.  FINDINGS: Stable cardiomediastinal silhouette. No pneumothorax or pleural effusion is noted. Interval placement of right-sided PICC line with distal tip overlying expected position of the SVC. Both lungs  are clear. Status post bilateral shoulder arthroplasty.  IMPRESSION: Interval placement of right-sided PICC line with distal tip overlying expected position of the SVC.   Electronically Signed   By: Marijo Conception, M.D.   On: 05/18/2014 18:32    Labs:  CBC:  Recent Labs  05/14/14 0400 05/15/14 0350 05/16/14 0408 05/20/14 1121  WBC 14.1* 15.1* 12.7* 7.4  HGB 13.5 12.5* 13.0 12.9*  HCT 40.5 37.4* 40.0 39.0  PLT 377 344 322 299     COAGS:  Recent Labs  05/05/14 0055 05/20/14 1121  INR 1.18 1.27  APTT 32  --     BMP:  Recent Labs  05/14/14 0400 05/15/14 0350 05/16/14 0408 05/20/14 1121  NA 144 140 138 134*  K 4.4 4.1 4.2 4.0  CL 115* 113* 109 104  CO2 19* 19* 18* 19*  GLUCOSE 139* 142* 154* 144*  BUN 14 18 15 16   CALCIUM 9.1 8.9 9.1 8.5*  CREATININE 0.77 0.76 0.67 0.69  GFRNONAA >60 >60 >60 >60  GFRAA >60 >60 >60 >60    LIVER FUNCTION TESTS:  Recent Labs  05/05/14 0500 05/12/14 0355 05/15/14 0350 05/20/14 1121  BILITOT 1.8* 1.3* 0.8 0.7  AST 167* 50* 53* 44*  ALT 111* 54 45 39  ALKPHOS 120* 115 111 107  PROT 7.4 6.6 6.4* 6.2*  ALBUMIN 3.5 3.0* 2.5* 2.3*    Assessment and Plan:  S/P Rt Iliopsoas abscess drainage 5/11.  Afebrile.  Continue current care. Plan per Ortho.  SignedNevin Bloodgood 05/22/2014, 9:00 AM   I spent a total of 15 in face to face in clinical consultation/evaluation, greater than 50% of which was counseling/coordinating care for Rt Iliopsoas drain.

## 2014-05-22 NOTE — Progress Notes (Signed)
Nutrition Follow-up  DOCUMENTATION CODES:  Obesity unspecified  INTERVENTION:  Boost Breeze TID, Ensure Enlive BID, Prostat BID  NUTRITION DIAGNOSIS:  Inadequate oral intake related to inability to eat as evidenced by  (now CL diet).  GOAL:  Patient will meet greater than or equal to 90% of their needs -variably met  MONITOR:  PO intake, Supplement acceptance, Labs, Weight trends, Skin, I & O's  ASSESSMENT: 79 y.o. male with history of diabetes mellitus type 2, chronic alcoholism, hypertension, gout presents to the ER because of worsening pain in the right hip and right lower quadrant.  5/3: - Pt s/p CT guided aspiration of left psoas hematoma 5/2 - Ongoing encephalopathy, pt currently NPO. Nutritional history obtained from RN and pt medical chart.  - No weight history.   5/6: - Consult received for TF initiation and management. Panda tube placed 5/6. - RD to order TF. Pt with history of DM.  - Will monitor for TF tolerance. - Pt is at risk for re-feeding syndrome given that he has been NPO for 8 days and has a history of ETOH abuse.   5/9: -RD consulted for nutritional assessment.  -Pt pulled out panda tube 5/7.  -SLP evaluation showed no s/s of aspiration. States that his mental status is his biggest barrier to PO intake. Recommend Dysphagia 1 diet with thin liquids. -Unable to obtain history from pt d/t confusion. -PT would benefit from nutritional supplementation d/t malnutrition status and ETOH abuse history.  5/12: -Pt's mental status has improved. RD was able to speak with patient. -Pt reports feeling hungry. -Pt with clear liquid tray, untouched. RD assisted pt with opening containers. Provided pt with Resource Breeze supplement. -Pt would like Ensure supplements when diet is advanced.   5/17: -Pt eating 0-25% since last assessment but reports very good appetite -Pt with some forgetfulness; remembers he had eggs for breakfast but unsure of other  intakes -He states that he really likes the ordered nutrition supplements and would like to continue receiving them -Pt agitated about waiting on a staff member who was to provide him with information about possible d/c, pt unwilling to talk further at this time -Pt not meeting needs. Order for Glucerna 1.2 @ 70 mL/hr in place but pt not receiving TF.  Labs: CBGs: 118-190 mg/dL  Height:  Ht Readings from Last 1 Encounters:  05/04/14 5' 7"  (1.702 m)    Weight:  Wt Readings from Last 1 Encounters:  05/22/14 225 lb 1.4 oz (102.1 kg)    Ideal Body Weight:  61.4 kg  Wt Readings from Last 10 Encounters:  05/22/14 225 lb 1.4 oz (102.1 kg)  05/11/14 222 lb (100.699 kg)    BMI:  Body mass index is 35.25 kg/(m^2).  Estimated Nutritional Needs:  Kcal:  2100-2300  Protein:  120-130 g  Fluid:  2.1 L/day  Skin:  Wound (see comment) (hip incision, skin tear, MSAD)  Diet Order:  DIET DYS 3 Room service appropriate?: Yes; Fluid consistency:: Thin  EDUCATION NEEDS:  Education needs no appropriate at this time   Intake/Output Summary (Last 24 hours) at 05/22/14 1353 Last data filed at 05/22/14 0824  Gross per 24 hour  Intake    120 ml  Output    825 ml  Net   -705 ml    Last BM:  5/12   Jarome Matin, RD, LDN Inpatient Clinical Dietitian Pager # [phone] After hours/weekend pager # [phone]

## 2014-05-23 DIAGNOSIS — K6812 Psoas muscle abscess: Secondary | ICD-10-CM

## 2014-05-23 DIAGNOSIS — T8451XD Infection and inflammatory reaction due to internal right hip prosthesis, subsequent encounter: Secondary | ICD-10-CM

## 2014-05-23 LAB — GLUCOSE, CAPILLARY
Glucose-Capillary: 111 mg/dL — ABNORMAL HIGH (ref 65–99)
Glucose-Capillary: 108 mg/dL — ABNORMAL HIGH (ref 65–99)
Glucose-Capillary: 120 mg/dL — ABNORMAL HIGH (ref 65–99)
Glucose-Capillary: 156 mg/dL — ABNORMAL HIGH (ref 65–99)
Glucose-Capillary: 162 mg/dL — ABNORMAL HIGH (ref 65–99)
Glucose-Capillary: 115 mg/dL — ABNORMAL HIGH (ref 65–99)

## 2014-05-23 MED ORDER — HYDRALAZINE HCL 25 MG PO TABS
25.0000 mg | ORAL_TABLET | Freq: Three times a day (TID) | ORAL | Status: DC
  Administered 2014-05-23 – 2014-05-24 (×3): 25 mg via ORAL
  Filled 2014-05-23 (×3): qty 1

## 2014-05-23 MED ORDER — INSULIN ASPART 100 UNIT/ML ~~LOC~~ SOLN: 0.0000 [IU] | Freq: Every day | SUBCUTANEOUS | Status: DC

## 2014-05-23 MED ORDER — INSULIN ASPART 100 UNIT/ML ~~LOC~~ SOLN
0.0000 [IU] | Freq: Three times a day (TID) | SUBCUTANEOUS | Status: DC
  Administered 2014-05-23 (×2): 2 [IU] via SUBCUTANEOUS
  Administered 2014-05-24: 1 [IU] via SUBCUTANEOUS

## 2014-05-23 NOTE — Progress Notes (Signed)
[facility]  Date of Admission:  05/04/2014  Antibiotics: ceftriaxone  Subjective: No new issues  Objective: Temp:  [97.9 F (36.6 C)-99.1 F (37.3 C)] 97.9 F (36.6 C) (05/18 1329) Pulse Rate:  [61-91] 82 (05/18 1329) Resp:  [18-20] 18 (05/18 1329) BP: (122-142)/(49-79) 142/79 mmHg (05/18 1329) SpO2:  [97 %-98 %] 98 % (05/18 1329) Weight:  [225 lb 12 oz (102.4 kg)] 225 lb 12 oz (102.4 kg) (05/18 0456)  General: Awake, alert, nad Skin: no rashes Lungs: CTA B Cor: RRR   Lab Results Lab Results  Component Value Date   WBC 7.4 05/20/2014   HGB 12.9* 05/20/2014   HCT 39.0 05/20/2014   MCV 97.0 05/20/2014   PLT 299 05/20/2014    Lab Results  Component Value Date   CREATININE 0.69 05/20/2014   BUN 16 05/20/2014   NA 134* 05/20/2014   K 4.0 05/20/2014   CL 104 05/20/2014   CO2 19* 05/20/2014    Lab Results  Component Value Date   ALT 39 05/20/2014   AST 44* 05/20/2014   ALKPHOS 107 05/20/2014   BILITOT 0.7 05/20/2014      Microbiology: Recent Results (from the past 240 hour(s))  Body fluid culture     Status: None   Collection Time: 05/16/14  6:21 PM  Result Value Ref Range Status   Specimen Description SYNOVIAL RIGHT HIP  Final   Special Requests Normal  Final   Gram Stain   Final    ABUNDANT WBC PRESENT, PREDOMINANTLY PMN NO ORGANISMS SEEN Performed at [facility]    Culture   Final    FEW STREPTOCOCCUS GROUP D;high probability for S.bovis Note: CALLED TO KATIE BEESON 05/20/14 0800 BY SMITHERSJ Performed at [facility]    Report Status 05/20/2014 FINAL  Final   Organism ID, Bacteria STREPTOCOCCUS GROUP D;high probability for S.bovis  Final      Susceptibility   Streptococcus group d;high probability for s.bovis - MIC (ETEST)*    PENICILLIN .047 SENSITIVE Sensitive     * FEW STREPTOCOCCUS GROUP D;high probability for S.bovis  Body fluid culture     Status: None   Collection Time: 05/16/14  6:21 PM    Result Value Ref Range Status   Specimen Description ABSCESS RIGHT  Final   Special Requests Normal  Final   Gram Stain   Final    ABUNDANT WBC PRESENT,BOTH PMN AND MONONUCLEAR NO SQUAMOUS EPITHELIAL CELLS SEEN NO ORGANISMS SEEN Performed at [facility]    Culture   Final    FEW MICROAEROPHILIC STREPTOCOCCI Note: Standardized susceptibility testing for this organism is not available. Performed at [facility]    Report Status 05/20/2014 FINAL  Final    Studies/Results: Ct Abdomen Pelvis W Contrast  05/21/2014   CLINICAL DATA:  RIGHT psoas abscess. RIGHT hip pain. Follow-up. Drainage of the RIGHT psoas abscess. Evaluate for collection.  EXAM: CT ABDOMEN AND PELVIS WITH CONTRAST  TECHNIQUE: Multidetector CT imaging of the abdomen and pelvis was performed using the standard protocol following bolus administration of intravenous contrast.  CONTRAST:  182m OMNIPAQUE IOHEXOL 300 MG/ML  SOLN  COMPARISON:  None.  FINDINGS: Dependent atelectasis in the lungs. Small volume of ascites remains present. Atherosclerosis. Unchanged appearance of the liver and spleen. Small hiatal hernia. Stomach and gallbladder grossly appear within normal limits. There is no intra-abdominal free air. Dense aortoiliac atherosclerosis. Small and large bowel appears similar to prior exam. Small RIGHT iliopsoas abscess  remains present although decreased in size when compared to the prior exam of 05/10/2014. This is draining through the pigtail drainage catheter anterior to the RIGHT hip. The abscess today measures 3.5 cm x 2.1 cm (image 72 series 2).  Review of the bone windows shows no evidence of discitis/osteomyelitis by CT in this patient with psoas abscess. Unchanged degenerative nitrogen gas in the sacroiliac joints.  IMPRESSION: 1. Pigtail drainage catheter is in the fluid collection anterior to the RIGHT hip, which probably communicates with the RIGHT hip arthroplasty. 2. Iliopsoas abscess has  significantly improved compared to prior. Small residual iliopsoas abscess measures 35 mm x 21 mm (image 72 series 2). 3. Small volume ascites.   Electronically Signed   By: Dereck Ligas M.D.   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 Berenice Primas noted. Refuses definitive therapy. Will need at least 6 weeks of IV ceftriaxone through June 21st and can then convert to oral amoxicillin if drain is out and he is in recovery phase.  May consider lifetime suppression if things go well, though I think this is unlikely.   ESR 55, CRP 1.1 Weekly cbc, cmp  Antibiotics per home health protocol    Scharlene Gloss, [email or web address] [phone] pager   [phone] cell 05/23/2014, 2:28 PM

## 2014-05-23 NOTE — Progress Notes (Signed)
Patient ID: [name], male   DOB: 01/15/1935, 79 y.o.   MRN: [id number]     Subjective:  Pt doing ok today.  No new complaints.  Allergies: Allopurinol and Motrin  Medications: Prior to Admission medications   Medication Sig Start Date End Date Taking? Authorizing Provider  atenolol (TENORMIN) 50 MG tablet Take 50 mg by mouth daily. 04/16/14  Yes Historical Provider, MD  celecoxib (CELEBREX) 200 MG capsule Take 200 mg by mouth 2 (two) times daily. 02/26/14  Yes Historical Provider, MD  CRESTOR 10 MG tablet Take 10 mg by mouth daily. Takes one every other day 03/05/14  Yes Historical Provider, MD  furosemide (LASIX) 20 MG tablet Take 20 mg by mouth daily. 02/21/14  Yes Historical Provider, MD  LYRICA 75 MG capsule Take 75 mg by mouth 2 (two) times daily. 75mg  in the morning and 150mg  at night 04/27/14  Yes Historical Provider, MD  metFORMIN (GLUCOPHAGE) 500 MG tablet Take 1,000 mg by mouth. 02/24/14  Yes Historical Provider, MD  traMADol (ULTRAM) 50 MG tablet Take 100 mg by mouth. Every 4 hours 04/10/14  Yes Historical Provider, MD  ULORIC 80 MG TABS Take 1 tablet by mouth daily. 02/22/14  Yes Historical Provider, MD     Vital Signs: BP 142/79 mmHg  Pulse 82  Temp(Src) 97.9 F (36.6 C) (Oral)  Resp 18  Ht 5\' 7"  (1.702 m)  Wt 225 lb 12 oz (102.4 kg)  BMI 35.35 kg/m2  SpO2 98%  Physical Exam Awake and Alert Right hip drain in place.  Mild inflammatory erythema only at the drain entrance site. No surrounding erythema. ~ 20 mls output.  Imaging: Ct Abdomen Pelvis W Contrast  05/21/2014   CLINICAL DATA:  RIGHT psoas abscess. RIGHT hip pain. Follow-up. Drainage of the RIGHT psoas abscess. Evaluate for collection.  EXAM: CT ABDOMEN AND PELVIS WITH CONTRAST  TECHNIQUE: Multidetector CT imaging of the abdomen and pelvis was performed using the standard protocol following bolus administration of intravenous contrast.  CONTRAST:  18mL OMNIPAQUE IOHEXOL 300 MG/ML  SOLN  COMPARISON:  None.   FINDINGS: Dependent atelectasis in the lungs. Small volume of ascites remains present. Atherosclerosis. Unchanged appearance of the liver and spleen. Small hiatal hernia. Stomach and gallbladder grossly appear within normal limits. There is no intra-abdominal free air. Dense aortoiliac atherosclerosis. Small and large bowel appears similar to prior exam. Small RIGHT iliopsoas abscess remains present although decreased in size when compared to the prior exam of 05/10/2014. This is draining through the pigtail drainage catheter anterior to the RIGHT hip. The abscess today measures 3.5 cm x 2.1 cm (image 72 series 2).  Review of the bone windows shows no evidence of discitis/osteomyelitis by CT in this patient with psoas abscess. Unchanged degenerative nitrogen gas in the sacroiliac joints.  IMPRESSION: 1. Pigtail drainage catheter is in the fluid collection anterior to the RIGHT hip, which probably communicates with the RIGHT hip arthroplasty. 2. Iliopsoas abscess has significantly improved compared to prior. Small residual iliopsoas abscess measures 35 mm x 21 mm (image 72 series 2). 3. Small volume ascites.   Electronically Signed   By: Dereck Ligas M.D.   On: 05/21/2014 15:42    Labs:  CBC:  Recent Labs  05/14/14 0400 05/15/14 0350 05/16/14 0408 05/20/14 1121  WBC 14.1* 15.1* 12.7* 7.4  HGB 13.5 12.5* 13.0 12.9*  HCT 40.5 37.4* 40.0 39.0  PLT 377 344 322 299    COAGS:  Recent Labs  05/05/14 0055 05/20/14  1121  INR 1.18 1.27  APTT 32  --     BMP:  Recent Labs  05/14/14 0400 05/15/14 0350 05/16/14 0408 05/20/14 1121  NA 144 140 138 134*  K 4.4 4.1 4.2 4.0  CL 115* 113* 109 104  CO2 19* 19* 18* 19*  GLUCOSE 139* 142* 154* 144*  BUN 14 18 15 16   CALCIUM 9.1 8.9 9.1 8.5*  CREATININE 0.77 0.76 0.67 0.69  GFRNONAA >60 >60 >60 >60  GFRAA >60 >60 >60 >60    LIVER FUNCTION TESTS:  Recent Labs  05/05/14 0500 05/12/14 0355 05/15/14 0350 05/20/14 1121  BILITOT 1.8*  1.3* 0.8 0.7  AST 167* 50* 53* 44*  ALT 111* 54 45 39  ALKPHOS 120* 115 111 107  PROT 7.4 6.6 6.4* 6.2*  ALBUMIN 3.5 3.0* 2.5* 2.3*    Assessment and Plan:  S/P Rt Iliopsoas abscess drainage 5/11. Afebrile. Continue current care. Plan per Ortho Dr. Vernard Gambles reviewed CT scan. No need for any additional drain placement per Dr. Gabriel Carina current drain in place for now.  SignedNevin Bloodgood 05/23/2014, 2:34 PM   I spent a total of 15 in face to face in clinical consultation/evaluation, greater than 50% of which was counseling/coordinating care for psoas drain

## 2014-05-23 NOTE — Progress Notes (Signed)
PT Cancellation Note  Patient Details Name: TAYO MAUTE MRN: [id number] DOB: 1935/12/31   Cancelled Treatment:    Reason Eval/Treat Not Completed: pt reports not feeling well and he politely declines PT on today.    Weston Anna, MPT Pager: [phone]

## 2014-05-23 NOTE — Progress Notes (Signed)
Progress Note   [name] RXY:[id number] DOB: Jun 27, 1935 DOA: 05/04/2014 PCP: Kenneth Amel, MD   Brief Narrative:   [name] is an 79 y.o. male with a PMH of chronic alcoholism, diabetes and hypertension who was admitted 05/04/14 with a one-week history of right hip and RLQ abdominal pain associated with fever. CT of abdomen and pelvis showed iliopsoas abscess. Patient also noted to have new onset atrial fibrillation, treated with Cardizem drip. Cardiology subsequently consulted and 2-D echo done. The patient's CHADS2vasc score is 4, but he was felt to be at undue high risk for anticoagulation given issues with compliance and ongoing alcoholism. Blood cultures grew strep bovis. Patient subsequently underwent iliopsoas abscess drainage with cultures growing strep viridans.  ID has been following the patient. During the course of his hospitalization, the patient was consistently confused and an MRI was done 05/11/14 which showed a subdural hematoma and small CVA. Neurology was consulted. Antiplatelet therapy held secondary to subdural hematoma.  Assessment/Plan:   Principal Problem:   Sepsis/streptococcus D bacteremia secondary to right iliopsoas abscess and septic arthritis of hip - Unsuccessful drain placement of the right iliopsoas abscess 05/06/2014 and successfully repeated on 05/15/2014. - Repeat CT abd and pelvis showed improved abscess,  awaiting IR recommendations, has a fluid collection anterior to the drain. - Status post right hip aspiration on 05/16/2014 with purulent material, cultures grew strep group D. - Initial blood cultures positive for group D streptococcus. Repeat blood culture on 05/08/2014 negative. Inserted PICC on 5.12.2016.  - Family d/w with orthopedic surgeon that they would like conservative therapy and re-evaluation as an outpatient. - On Antibiotic vancomycin and Rocephin. Will need 6 weeks of IV therapy then transitioned to oral amoxicillin indefinitely.    - High risk of C. Diff. PPI on hold. Continue Florastor.  - CPR /ESR elevated.  Active Problems:   New onset atrial fibrillation - Rate controlled on Cardizem drip. - TSH WNL. - 2-D echo showed preserved EF, mild mitral and tricuspid regurgitation. - Not a candidate for anticoagulation secondary to bilateral subdural hematoma, noncompliance, and chronic alcohol abuse.    Diabetes mellitus type 2, controlled - Currently being managed with insulin sensitive SSI every 4 hours. CBGs 108-149. - Switch to SSI before meals and at bedtime.    Essential hypertension - Currently being managed with hydralazine and as needed metoprolol. - Was on atenolol prior to admission. Change hydralazine to by mouth route.    Alcoholism - Status post detoxification with Ativan per CIWA protocol. - Continue to supplement thiamine and folic acid.    Acute encephalopathy - Continue Haldol as needed. - Ammonia level was 29 on admission.    SDH (subdural hematoma) - MRI of the head 05/11/2014 show subdural hematoma on the left MCA territory. - Neurology consulted and recommended no interventions at this time.    DVT Prophylaxis  Code Status: Full. Family Communication: No family at the bedside. Disposition Plan: SNF in next 24-48 hours.   IV Access:    Peripheral IV   Procedures and diagnostic studies:   Dg Abd 1 View  05/11/2014   CLINICAL DATA:  79 year old male with a history of nasogastric tube placement  EXAM: ABDOMEN - 1 VIEW  COMPARISON:  None.  FINDINGS: Gas within small bowel and colon. No abnormally distended small bowel loops.  Tip with surgical tubing projects within the mid mediastinum, below the level of the carina, potentially in the distal esophagus. No gastric tube within the left  upper quadrant.  Calcifications of the aorta.  Formed stool within the rectum.  Surgical changes of right hip arthroplasty  IMPRESSION: Tip of gastric tube projects within the distal esophagus, below the  level of the carina though not in the stomach.  Atherosclerosis.  Unremarkable bowel gas pattern with formed stool in the rectum, better characterized on recent prior CT.  Signed,  Dulcy Fanny. Earleen Newport, DO  [facility]  [facility]   Electronically Signed   By: Corrie Mckusick D.O.   On: 05/11/2014 12:32   Ct Head Wo Contrast  05/11/2014   CLINICAL DATA:  Encephalopathy, agitation  EXAM: CT HEAD WITHOUT CONTRAST  TECHNIQUE: Contiguous axial images were obtained from the base of the skull through the vertex without intravenous contrast.  COMPARISON:  None.  FINDINGS: Motion degraded images.  No evidence of parenchymal hemorrhage or extra-axial fluid collection. No mass lesion, mass effect, or midline shift.  No CT evidence of acute infarction.  Subcortical white matter and periventricular small vessel ischemic changes.  Global cortical atrophy.  Secondary ventricular prominence.  The visualized paranasal sinuses are essentially clear. The mastoid air cells are unopacified. Nasoenteric tube.  No evidence of calvarial fracture.  IMPRESSION: Motion degraded images.  No evidence of acute intracranial abnormality.  Atrophy with small vessel ischemic changes.   Electronically Signed   By: Julian Hy M.D.   On: 05/11/2014 15:49   Ct Angio Chest Pe W/cm &/or Wo Cm  05/11/2014   CLINICAL DATA:  Tachycardia, fevers  EXAM: CT ANGIOGRAPHY CHEST WITH CONTRAST  TECHNIQUE: Multidetector CT imaging of the chest was performed using the standard protocol during bolus administration of intravenous contrast. Multiplanar CT image reconstructions and MIPs were obtained to evaluate the vascular anatomy.  CONTRAST:  113m OMNIPAQUE IOHEXOL 350 MG/ML SOLN  COMPARISON:  Plain film from earlier in the same day.  FINDINGS: Images are significantly limited by patient motion artifact. The lungs show mild dependent atelectasis. No focal confluent infiltrate is seen. Small bilateral pleural  effusions are noted. A nasogastric catheter is noted in place. The thoracic aorta demonstrates a normal branching pattern is well as significant calcific plaque. No dissection or aneurysmal dilatation is seen.  The pulmonary artery shows no large central pulmonary embolus. Evaluation of the smaller vessels is limited due to patient motion artifact. No significant hilar or mediastinal adenopathy is noted. Coronary calcifications are seen. Scanning into the upper abdomen demonstrates mild ascites. The osseous structures show no acute abnormality.  Review of the MIP images confirms the above findings.  IMPRESSION: Severely limited exam. Mild dependent atelectasis and small effusions are seen.  Mild ascites is noted.  No definitive pulmonary emboli are seen.   Electronically Signed   By: MInez CatalinaM.D.   On: 05/11/2014 15:50   Ct Pelvis W Contrast  05/15/2014   CLINICAL DATA:  Reassess known right iliopsoas abscess. Previously drained. Patient continues to have fevers.  EXAM: CT PELVIS WITH CONTRAST  TECHNIQUE: Multidetector CT imaging of the pelvis was performed using the standard protocol following the bolus administration of intravenous contrast.  CONTRAST:  1039mOMNIPAQUE IOHEXOL 300 MG/ML  SOLN  COMPARISON:  05/10/2014 and 05/04/2014  FINDINGS: Right hip prosthesis causes moderate streak artifact. Again noted is patient's fluid collection within the right iliopsoas muscle/bursa at the level of the hip and extending superiorly to approximately the L5 level. This measures approximately 4 x 4.7 cm in transverse in AP dimension at the level of the right acetabulum (  previously 3.8 x 5.1 cm). No adjacent osteolysis.  Ascites unchanged. Moderate calcified plaque over the abdominal aorta and iliac arteries. Small paraumbilical hernia and small bilateral inguinal hernias unchanged. Moderate diverticulosis of the colon and moderate fecal retention over the rectum. Mild degenerate change of the left hip and moderate  degenerative change of the spine. Remainder the exam is unchanged.  IMPRESSION: Continued evidence of patient's known right iliopsoas muscle/bursal abscess measuring 4 x 4.7 cm in transverse in AP dimension (previously 3.8 x 5.1 cm).  Other chronic findings as described unchanged.   Electronically Signed   By: Marin Olp M.D.   On: 05/15/2014 16:54   Ct Pelvis W Contrast  05/04/2014   CLINICAL DATA:  Right hip pain and fever for 3 days. History of total hip replacement 2009.  EXAM: CT PELVIS WITH CONTRAST  TECHNIQUE: Multidetector CT imaging of the pelvis was performed using the standard protocol following the bolus administration of intravenous contrast.  CONTRAST:  56m OMNIPAQUE IOHEXOL 300 MG/ML  SOLN  COMPARISON:  05/04/2014 and MR 02/26/2010  FINDINGS: There is soft tissue density fullness with faint rim enhancement along the lower aspect of the right iliopsoas muscle. This measures 5.2 x 3.5 x 8.5 cm. Centrally this measures soft tissue density. Considerations include abscess, phlegmon, myositis, or other soft tissue mass. Further evaluation with MRI may be helpful. Patient has had previous right total hip arthroplasty. Prosthetic components appear well seated. There is no evidence for periprosthetic loosening. Degenerative changes are seen in the lower lumbar spine. There is dense atherosclerosis of the abdominal aorta. There is significant sigmoid diverticulosis.  IMPRESSION: 1. Rim enhancing mass along the lower aspect of the iliopsoas muscle, suspicious for abscess or phlegmon. Myositis or other soft tissue mass is also a consideration. Further evaluation with MRI may be helpful. 2. Status post right total hip arthroplasty without adverse features. 3. Significant sigmoid diverticulosis.   Electronically Signed   By: ENolon NationsM.D.   On: 05/04/2014 20:39   Mr Brain Wo Contrast  05/11/2014   ADDENDUM REPORT: 05/11/2014 21:44  ADDENDUM: Study discussed by telephone with Provider T.Rogue Bussingon  05/11/2014 at 2140 hours.  In discussion of the findings, it occurs to me that the small cortical focus of diffusion abnormality along the left frontal lobe might alternatively reflect a small cerebral contusion (on the same side as the small left subdural hematoma).  I inquired about the possibility of recent fall, but Provider CRogue Bussingwas not aware of and did not immediately find documentation of any recent trauma.   Electronically Signed   By: HGenevie AnnM.D.   On: 05/11/2014 21:44   05/11/2014   CLINICAL DATA:  79year old male with increased agitation and confusion. Acute encephalopathy. Tachycardia and fever. Right iliopsoas abscess. Initial encounter.  EXAM: MRI HEAD WITHOUT CONTRAST  TECHNIQUE: Multiplanar, multiecho pulse sequences of the brain and surrounding structures were obtained without intravenous contrast.  COMPARISON:  Head CT without contrast 1525 hours today.  FINDINGS: Study is intermittently degraded by motion artifact despite repeated imaging attempts.  Cerebral volume is within normal limits for age. No ventriculomegaly. No midline shift or significant intracranial mass effect.  Subtle left subdural hematoma is identified (series 7, images 15 and 16). This is up to 2 mm in thickness. There is mild associated diffusion artifact (series 402, image 17). However, there is a smaller more intense focus of increased trace diffusion signal along the left frontal operculum on series 402, image 20. This is  also seen on coronal diffusion (series 5, image 27). No associated T2 or FLAIR hyperintensity.  No other diffusion abnormality. Major intracranial vascular flow voids are within normal limits.  No other intracranial hemorrhage is identified. Patchy nonspecific periventricular white matter T2 and FLAIR hyperintensity corresponds hypodensity on the recent CT. No temporal lobe edema or signal abnormality is identified. Deep gray matter nuclei, brainstem and cerebellum are within normal limits for age.  Negative pituitary, cervicomedullary junction and visualized cervical spine.  Visible internal auditory structures appear normal. Mastoids are clear. Trace paranasal sinus mucosal thickening. Postoperative changes to the right globe. Visualized scalp soft tissues are within normal limits. Visualized bone marrow signal is within normal limits.  IMPRESSION: 1. Tiny left subdural hematoma, up to 2 mm in thickness. No associated mass effect. This is currently occult by CT. 2. Evidence of a tiny cortically based infarct in the low anterior left MCA territory. No mass effect or associated hemorrhage. 3. Mild to moderate for age nonspecific cerebral white matter signal changes  Electronically Signed: By: Genevie Ann M.D. On: 05/11/2014 21:31   Ct Abdomen Pelvis W Contrast  05/21/2014   CLINICAL DATA:  RIGHT psoas abscess. RIGHT hip pain. Follow-up. Drainage of the RIGHT psoas abscess. Evaluate for collection.  EXAM: CT ABDOMEN AND PELVIS WITH CONTRAST  TECHNIQUE: Multidetector CT imaging of the abdomen and pelvis was performed using the standard protocol following bolus administration of intravenous contrast.  CONTRAST:  170m OMNIPAQUE IOHEXOL 300 MG/ML  SOLN  COMPARISON:  None.  FINDINGS: Dependent atelectasis in the lungs. Small volume of ascites remains present. Atherosclerosis. Unchanged appearance of the liver and spleen. Small hiatal hernia. Stomach and gallbladder grossly appear within normal limits. There is no intra-abdominal free air. Dense aortoiliac atherosclerosis. Small and large bowel appears similar to prior exam. Small RIGHT iliopsoas abscess remains present although decreased in size when compared to the prior exam of 05/10/2014. This is draining through the pigtail drainage catheter anterior to the RIGHT hip. The abscess today measures 3.5 cm x 2.1 cm (image 72 series 2).  Review of the bone windows shows no evidence of discitis/osteomyelitis by CT in this patient with psoas abscess. Unchanged  degenerative nitrogen gas in the sacroiliac joints.  IMPRESSION: 1. Pigtail drainage catheter is in the fluid collection anterior to the RIGHT hip, which probably communicates with the RIGHT hip arthroplasty. 2. Iliopsoas abscess has significantly improved compared to prior. Small residual iliopsoas abscess measures 35 mm x 21 mm (image 72 series 2). 3. Small volume ascites.   Electronically Signed   By: GDereck LigasM.D.   On: 05/21/2014 15:42   Ct Abdomen Pelvis W Contrast  05/10/2014   CLINICAL DATA:  Fever, follow-up iliopsoas abscess. History of right hip replacement.  EXAM: CT ABDOMEN AND PELVIS WITH CONTRAST  TECHNIQUE: Multidetector CT imaging of the abdomen and pelvis was performed using the standard protocol following bolus administration of intravenous contrast.  CONTRAST:  1077mOMNIPAQUE IOHEXOL 300 MG/ML  SOLN  COMPARISON:  CT pelvis dated 05/04/2014  FINDINGS: Motion degraded images.  Lower chest:  Small bilateral pleural effusions.  Cardiomegaly.  Coronary atherosclerosis.  Hepatobiliary: Liver is grossly unremarkable.  Gallbladder is severely motion degraded. No intrahepatic or extrahepatic ductal dilatation.  Pancreas: Within normal limits.  Spleen: Severely motion degraded but grossly unremarkable.  Adrenals/Urinary Tract: Adrenal glands are motion degraded but unremarkable.  Kidneys are grossly unremarkable.  No hydronephrosis.  Bladder is within normal limits.  Stomach/Bowel: Stomach is poorly evaluated but notable  for a small hiatal hernia.  No evidence of bowel obstruction.  Colonic diverticulosis.  Moderate distal colonic stool burden.  Vascular/Lymphatic: Atherosclerotic calcifications of the abdominal aorta and branch vessels.  No suspicious abdominopelvic lymphadenopathy  Reproductive: Prostate is grossly unremarkable  Other: Moderate abdominopelvic ascites.  Small fat containing bilateral inguinal hernias.  Musculoskeletal: 5.1 x 3.8 cm right iliopsoas fluid collection/abscess  (series 2/image 78), previously 5.3 x 3.5 cm, grossly unchanged.  Mild body wall edema.  Degenerative changes of the visualized thoracolumbar spine.  IMPRESSION: Motion degraded images.  5.1 x 3.8 cm right iliopsoas fluid collection/abscess, grossly unchanged.  Small bilateral pleural effusions. Moderate abdominopelvic ascites. Mild body wall edema.  No evidence of bowel obstruction. Moderate distal colonic stool burden.   Electronically Signed   By: Julian Hy M.D.   On: 05/10/2014 15:45   Ct Aspiration  05/16/2014   CLINICAL DATA:  79 year old male with right psoas hematoma which is suspected to be superinfected. There is also clinical concern for possible hip joint infection.  EXAM: CT IMAGE GUIDED DRAINAGE BY PERCUTANEOUS CATHETER; CT GUIDANCE NEEDLE PLACEMENT  Date: 05/16/2014  PROCEDURE: 1. Aspiration right hip joint under CT guidance 2. Placement of a 14 French drain into the right psoas hematoma/abscess Interventional Radiologist:  Criselda Peaches, MD  ANESTHESIA/SEDATION: Moderate (conscious) sedation was used. 4 mg Versed, 50 mcg Fentanyl were administered intravenously. The patient's vital signs were monitored continuously by radiology nursing throughout the procedure.  Sedation Time: 32 minutes  MEDICATIONS: None additional  TECHNIQUE: Informed consent was obtained from the patient following explanation of the procedure, risks, benefits and alternatives. The patient understands, agrees and consents for the procedure. All questions were addressed. A time out was performed.  A planning axial CT scan was performed. Suitable skin entry sites were selected and marked. The region was then sterilely prepped and draped in standard fashion using Betadine skin prep.  Attention was first turned to the hip joint aspiration. Using a far lateral approach, an 18 gauge trocar needle was advanced over the greater tuberosity and into the hip joint. CT fluoroscopic imaging was used but with limited effect  secondary to the extensive streak artifact surrounding the hip prosthesis. However, the needle tip was confirmed within the joint space by the tactile feedback of metal on metal. Aspiration yielded approximately 10 mL of thick, brown purulent fluid. A sample was sent for Gram stain, cell count and culture. The needle was removed.  Attention was next turned to the psoas collection. Local anesthesia was attained by infiltration with 1% lidocaine. Using intermittent CT fluoroscopic guidance, an 18 gauge trocar needle was advanced into the fluid collection. An Amplatz wire was then coiled within the fluid collection and the tract dilated to 12 [place]. A Cook 5 [place] all-purpose drainage catheter was then advanced over the wire and coiled within the psoas collection. Aspiration yields approximately 120 mL of thick, brown purulent fluid. The appearance of the fluid is almost identical to that which was aspirated from the hip joint suggesting a frank communication.  The drain was then flushed with sterile saline and connected to JP bulb drainage. The drain was secured to the skin with 0 Prolene suture and an adhesive fixation device. The patient tolerated the procedure well.  COMPLICATIONS: None  IMPRESSION: 1. Aspiration of right hip joint yields approximately 10 mL thick, brown purulent fluid. Grossly, this fluid is infected. Samples were sent for Gram stain, cell count and culture. 2. Placement of a 79 French drain into  the right psoas collection. Of note, the psoas collection was drained more inferiorly than previously attempted in the superior and medial aspect of the thigh rather than within the anatomic pelvis. Aspiration yields 120 mL thick, brown purulent fluid. The appearance of the fluid is identical to that aspirated from the hip joint. These results were called by telephone at the time of interpretation on 05/16/2014 at 5:00 pm to Dr. Dr. Wende Neighbors, who verbally acknowledged these results.   Electronically  Signed   By: Jacqulynn Cadet M.D.   On: 05/16/2014 17:55   Ct Aspiration  05/07/2014   CLINICAL DATA:  Right hip and pelvic pain, fever. Right psoas process on recent CT. Recent aspiration was nondiagnostic.  EXAM: CT GUIDED NEEDLE ASPIRATE BIOPSY OF RIGHT PSOAS PROCESS  PROCEDURE: The procedure risks, benefits, and alternatives were explained to the family. Questions regarding the procedure were encouraged and answered. The family understands and consents to the procedure.  Select axial scans through the lower pelvis were obtained and the collection was localized. An appropriate skin entry site was determined and marked.  The operative field was prepped with Betadinein a sterile fashion, and a sterile drape was applied covering the operative field. A sterile gown and sterile gloves were used for the procedure. Local anesthesia was provided with 1% Lidocaine.  Under CT fluoroscopic guidance, an 18 gauge trocar needle was advanced into the lesion. Once needle tip position was confirmed, aspiration returned only 5 mL of bloody purulent material. The patient tolerated the procedure well.  COMPLICATIONS: None immediate  FINDINGS: Asymmetric enlargement right psoas musculature. Aspiration of the right psoas process returned 5 mL of bloody purulent material. This was sent for Gram stain, culture and sensitivity.  IMPRESSION: Aspiration of right psoas process returns 5 mL bloody purulent material, sent for Gram stain, culture and sensitivity.   Electronically Signed   By: Lucrezia Europe M.D.   On: 05/07/2014 16:40   Ct Aspiration  05/06/2014   CLINICAL DATA:  79 year old male with a history of bacteremia and sepsis.  CT imaging demonstrates a heterogeneous collection within the right psoas musculature. He has been referred for potential drainage of a presumed abscess.  EXAM: CT GUIDED DRAINAGE OF RIGHT PSOAS ABSCESS  ANESTHESIA/SEDATION: Zero Mg IV Versed 0 mcg IV Fentanyl  Total Moderate Sedation Time:  0 minutes   PROCEDURE: The procedure, risks, benefits, and alternatives were explained to the patient's family. Questions regarding the procedure were encouraged and answered. The patient understands and consents to the procedure.  Patient is position in the supine position on the CT gantry table. CT scan of the pelvis was acquired.  The the right inguinal region was prepped with Betadinein a sterile fashion, and a sterile drape was applied covering the operative field. A sterile gown and sterile gloves were used for the procedure. Local anesthesia was provided with 1% Lidocaine.  Once the patient is prepped and draped sterilely, the skin and subcutaneous tissues overlying the right psoas were generously infiltrated with 1% lidocaine for local anesthesia. A small stab incision was made with 11 blade scalpel.  A trocar needle was advanced with CT guidance into the greatest diameter of the collection involving the right psoas. Once we confirmed the tip of the needle with imaging, the stylet was removed and a wire was placed.  Serial dilation of the soft tissues was performed, and then a 10 [place] drain was placed into the collection.  Vigorous aspiration was attempted while manipulating the 10 French tube.  No fluid was aspirated.  The patient tolerated the procedure well and remained hemodynamically stable throughout.  No complications were encountered and no blood loss was encounter.  A sterile dressing was placed.  COMPLICATIONS: None  FINDINGS: CT study of the pelvis demonstrates heterogeneously dense collection within the right psoas musculature, similar in size to the comparison CT. No significant peripheral stranding.  When placing the needle into the collection, the patient experienced some discomfort.  CT images demonstrate placement of pigtail catheter into the lateral collection, safely lateral to the femoral vasculature as well as the inguinal canal.  No fluid could be aspirated with vigorous aspiration and replacement  of the tube. Upon withdrawal of the drain no fluid could be aspirated.  IMPRESSION: Status post attempted aspiration of dense fluid collection within the right psoas musculature. No fluid could be aspirated once the 10 French drain was placed into the fluid collection, suggesting non liquified hematoma at this time. No drain was left, as this could lead to potential seeding with bacteria of a sterile hematoma.  Signed,  Dulcy Fanny. Earleen Newport, DO  [facility]  [facility]   Electronically Signed   By: Corrie Mckusick D.O.   On: 05/06/2014 16:29   Dg Chest Port 1 View  05/18/2014   CLINICAL DATA:  PICC line placement.  EXAM: PORTABLE CHEST - 1 VIEW  COMPARISON:  May 11, 2014.  FINDINGS: Stable cardiomediastinal silhouette. No pneumothorax or pleural effusion is noted. Interval placement of right-sided PICC line with distal tip overlying expected position of the SVC. Both lungs are clear. Status post bilateral shoulder arthroplasty.  IMPRESSION: Interval placement of right-sided PICC line with distal tip overlying expected position of the SVC.   Electronically Signed   By: Marijo Conception, M.D.   On: 05/18/2014 18:32   Dg Chest Port 1 View  05/11/2014   CLINICAL DATA:  Altered mental status, history of hypertension, atrial fibrillation, diabetes.  EXAM: PORTABLE CHEST - 1 VIEW  COMPARISON:  Portable chest x-ray of May 08, 2014  FINDINGS: The lungs are reasonably well inflated and exhibit coarse interstitial markings bilaterally especially in the right infrahilar region. There is no pleural effusion. The cardiac silhouette is mildly enlarged. The mediastinum is normal in width. There is dense calcification in the wall of the ascending and proximal descending thoracic aorta.  IMPRESSION: Subsegmental atelectasis or early interstitial infiltrate in the right infrahilar region. This could be related to aspiration. There is no pulmonary edema.   Electronically Signed   By: David   Martinique M.D.   On: 05/11/2014 09:40   Dg Chest Port 1 View  05/08/2014   CLINICAL DATA:  Dyspnea.  Unresponsive.  EXAM: PORTABLE CHEST - 1 VIEW  COMPARISON:  05/06/2014  FINDINGS: There is moderate unchanged cardiomegaly. No airspace consolidation is evident. There is no large effusion.  IMPRESSION: Unchanged cardiomegaly. No airspace consolidation or large effusion.   Electronically Signed   By: Andreas Newport M.D.   On: 05/08/2014 05:52   Dg Chest Port 1 View  05/06/2014   CLINICAL DATA:  Fever and worsening shortness of breath began today.  EXAM: PORTABLE CHEST - 1 VIEW  COMPARISON:  05/04/2014  FINDINGS: Patient motion degrades film quality. Patient has had prior bilateral shoulder arthroplasty. The heart is enlarged. Suspect developing infiltrate at the left lung base. Mild interstitial edema noted.  IMPRESSION: 1. Cardiomegaly and mild edema. 2. Suspect developing infiltrate in the left lower lobe.   Electronically Signed  By: Nolon Nations M.D.   On: 05/06/2014 17:31   Dg Chest Port 1 View  05/04/2014   CLINICAL DATA:  Fever.  Fatigue.  Altered mental status.  EXAM: PORTABLE CHEST - 1 VIEW  COMPARISON:  05/13/2010  FINDINGS: Heart size is within normal limits and stable allowing for portable technique. Atherosclerotic calcification of thoracic aorta again noted. Both lungs are clear. No evidence of pneumothorax or pleural effusion. Bilateral shoulder prostheses again noted.  IMPRESSION: Stable exam.  No active disease.   Electronically Signed   By: Earle Gell M.D.   On: 05/04/2014 16:25   Dg Knee Complete 4 Views Right  05/04/2014   CLINICAL DATA:  Fatigue and fever. Right lower extremity pain. Altered mental status.  EXAM: RIGHT KNEE - COMPLETE 4+ VIEW  COMPARISON:  None.  FINDINGS: Total knee prosthesis observed. No fracture or complicating feature identified. Polymer spacer remains in place.  Distal SFA and popliteal artery atherosclerotic calcification.  IMPRESSION: 1. Total knee  prosthesis without complicating feature identified. 2. Atherosclerosis.   Electronically Signed   By: Van Clines M.D.   On: 05/04/2014 16:28   Dg Hip Unilat With Pelvis 2-3 Views Right  05/04/2014   CLINICAL DATA:  Right lateral hip pain. History of arthroplasty. Altered mental status. Denies fall. Fatigue.  EXAM: RIGHT HIP (WITH PELVIS) 2-3 VIEWS  COMPARISON:  05/13/2010  FINDINGS: Patient has had previous right hip arthroplasty. There is no evidence for loosening or subluxation. There is dense atherosclerotic calcification of the femoral arteries. Degenerative changes are noted in the lower lumbar spine. No acute fracture.  IMPRESSION: No evidence for acute  abnormality.   Electronically Signed   By: Nolon Nations M.D.   On: 05/04/2014 16:27   Ct Image Guided Drainage By Percutaneous Catheter  05/16/2014   CLINICAL DATA:  79 year old male with right psoas hematoma which is suspected to be superinfected. There is also clinical concern for possible hip joint infection.  EXAM: CT IMAGE GUIDED DRAINAGE BY PERCUTANEOUS CATHETER; CT GUIDANCE NEEDLE PLACEMENT  Date: 05/16/2014  PROCEDURE: 1. Aspiration right hip joint under CT guidance 2. Placement of a 14 French drain into the right psoas hematoma/abscess Interventional Radiologist:  Criselda Peaches, MD  ANESTHESIA/SEDATION: Moderate (conscious) sedation was used. 4 mg Versed, 50 mcg Fentanyl were administered intravenously. The patient's vital signs were monitored continuously by radiology nursing throughout the procedure.  Sedation Time: 32 minutes  MEDICATIONS: None additional  TECHNIQUE: Informed consent was obtained from the patient following explanation of the procedure, risks, benefits and alternatives. The patient understands, agrees and consents for the procedure. All questions were addressed. A time out was performed.  A planning axial CT scan was performed. Suitable skin entry sites were selected and marked. The region was then sterilely  prepped and draped in standard fashion using Betadine skin prep.  Attention was first turned to the hip joint aspiration. Using a far lateral approach, an 18 gauge trocar needle was advanced over the greater tuberosity and into the hip joint. CT fluoroscopic imaging was used but with limited effect secondary to the extensive streak artifact surrounding the hip prosthesis. However, the needle tip was confirmed within the joint space by the tactile feedback of metal on metal. Aspiration yielded approximately 10 mL of thick, brown purulent fluid. A sample was sent for Gram stain, cell count and culture. The needle was removed.  Attention was next turned to the psoas collection. Local anesthesia was attained by infiltration with 1% lidocaine. Using intermittent  CT fluoroscopic guidance, an 18 gauge trocar needle was advanced into the fluid collection. An Amplatz wire was then coiled within the fluid collection and the tract dilated to 12 [place]. A Cook 77 [place] all-purpose drainage catheter was then advanced over the wire and coiled within the psoas collection. Aspiration yields approximately 120 mL of thick, brown purulent fluid. The appearance of the fluid is almost identical to that which was aspirated from the hip joint suggesting a frank communication.  The drain was then flushed with sterile saline and connected to JP bulb drainage. The drain was secured to the skin with 0 Prolene suture and an adhesive fixation device. The patient tolerated the procedure well.  COMPLICATIONS: None  IMPRESSION: 1. Aspiration of right hip joint yields approximately 10 mL thick, brown purulent fluid. Grossly, this fluid is infected. Samples were sent for Gram stain, cell count and culture. 2. Placement of a 28 French drain into the right psoas collection. Of note, the psoas collection was drained more inferiorly than previously attempted in the superior and medial aspect of the thigh rather than within the anatomic pelvis.  Aspiration yields 120 mL thick, brown purulent fluid. The appearance of the fluid is identical to that aspirated from the hip joint. These results were called by telephone at the time of interpretation on 05/16/2014 at 5:00 pm to Dr. Dr. Wende Neighbors, who verbally acknowledged these results.   Electronically Signed   By: Jacqulynn Cadet M.D.   On: 05/16/2014 17:55     Medical Consultants:    None.  Anti-Infectives:    IV vancomycin 4/29-5/6  IV Zosyn 4/29-5/4  IV Rocephin 5/4-present  Subjective:   [name] denies pain, dyspnea, nausea/vomiting.  Feels "hot".  Appetite is good.  Having BMs.  Objective:    Filed Vitals:   05/22/14 1741 05/22/14 1950 05/23/14 0057 05/23/14 0456  BP: 122/63 135/56 124/49 140/70  Pulse:  91 61 75  Temp:  98 F (36.7 C) 99.1 F (37.3 C) 98.2 F (36.8 C)  TempSrc:  Oral Axillary Oral  Resp:  _0 Height:      Weight:    102.4 kg (225 lb 12 oz)  SpO2:  98% 97% 97%    Intake/Output Summary (Last 24 hours) at 05/23/14 0743 Last data filed at 05/22/14 1900  Gross per 24 hour  Intake    340 ml  Output     95 ml  Net    245 ml    Exam: Gen:  NAD Cardiovascular:  RRR, No M/R/G Respiratory:  Lungs with bilateral rhonchi Gastrointestinal:  Abdomen soft, NT/ND, + BS Extremities:  1+ edema, venous stasis dermatitis   Data Reviewed:    Labs: Basic Metabolic Panel:  Recent Labs Lab 05/20/14 1121  NA 134*  K 4.0  CL 104  CO2 19*  GLUCOSE 144*  BUN 16  CREATININE 0.69  CALCIUM 8.5*   GFR Estimated Creatinine Clearance: 88.2 mL/min (by C-G formula based on Cr of 0.69). Liver Function Tests:  Recent Labs Lab 05/20/14 1121  AST 44*  ALT 39  ALKPHOS 107  BILITOT 0.7  PROT 6.2*  ALBUMIN 2.3*   Coagulation profile  Recent Labs Lab 05/20/14 1121  INR 1.27    CBC:  Recent Labs Lab 05/20/14 1121  WBC 7.4  HGB 12.9*  HCT 39.0  MCV 97.0  PLT 299   CBG:  Recent Labs Lab 05/22/14 1143 05/22/14 1626  05/22/14 1944 05/23/14 0056 05/23/14 0451  GLUCAP  149* 142* 127* 111* 108*    Microbiology Recent Results (from the past 240 hour(s))  Body fluid culture     Status: None   Collection Time: 05/16/14  6:21 PM  Result Value Ref Range Status   Specimen Description SYNOVIAL RIGHT HIP  Final   Special Requests Normal  Final   Gram Stain   Final    ABUNDANT WBC PRESENT, PREDOMINANTLY PMN NO ORGANISMS SEEN Performed at [facility]    Culture   Final    FEW STREPTOCOCCUS GROUP D;high probability for S.bovis Note: CALLED TO KATIE BEESON 05/20/14 0800 BY SMITHERSJ Performed at [facility]    Report Status 05/20/2014 FINAL  Final   Organism ID, Bacteria STREPTOCOCCUS GROUP D;high probability for S.bovis  Final      Susceptibility   Streptococcus group d;high probability for s.bovis - MIC (ETEST)*    PENICILLIN .047 SENSITIVE Sensitive     * FEW STREPTOCOCCUS GROUP D;high probability for S.bovis  Body fluid culture     Status: None   Collection Time: 05/16/14  6:21 PM  Result Value Ref Range Status   Specimen Description ABSCESS RIGHT  Final   Special Requests Normal  Final   Gram Stain   Final    ABUNDANT WBC PRESENT,BOTH PMN AND MONONUCLEAR NO SQUAMOUS EPITHELIAL CELLS SEEN NO ORGANISMS SEEN Performed at [facility]    Culture   Final    FEW MICROAEROPHILIC STREPTOCOCCI Note: Standardized susceptibility testing for this organism is not available. Performed at [facility]    Report Status 05/20/2014 FINAL  Final     Medications:   . antiseptic oral rinse  7 mL Mouth Rinse BID  . cefTRIAXone (ROCEPHIN)  IV  2 g Intravenous Q24H  . diltiazem  360 mg Oral Daily  . febuxostat  80 mg Oral Daily  . feeding supplement (ENSURE ENLIVE)  237 mL Oral BID BM  . feeding supplement (PRO-STAT SUGAR FREE 64)  30 mL Per Tube BID  . feeding supplement (RESOURCE BREEZE)  1 Container Oral TID BM  . folic acid  1 mg Intravenous Daily   Or  . folic acid   1 mg Oral Daily  . guaiFENesin  1,200 mg Oral BID  . hydrALAZINE  10 mg Intravenous Q4H  . insulin aspart  0-9 Units Subcutaneous Q4H  . multivitamin with minerals  1 tablet Oral Daily  . polyethylene glycol  17 g Oral Daily  . saccharomyces boulardii  250 mg Oral BID  . thiamine  100 mg Oral Daily   Or  . thiamine  100 mg Intravenous Daily   Continuous Infusions: . feeding supplement (GLUCERNA 1.2 CAL) Stopped (05/11/14 1830)    Time spent: 25 minutes.   LOS: 19 days   [facility] Pager [phone]. If unable to reach me by pager, please call my cell phone at [phone].  *Please refer to [email or web address], password [email or web address] to get updated schedule on who will round on this patient, as hospitalists switch teams weekly. If 7PM-7AM, please contact night-coverage at [email or web address], password [email or web address] for any overnight needs.  05/23/2014, 7:43 AM

## 2014-05-24 ENCOUNTER — Telehealth: Payer: Self-pay | Admitting: Radiology

## 2014-05-24 ENCOUNTER — Encounter (HOSPITAL_COMMUNITY): Payer: Self-pay | Admitting: Internal Medicine

## 2014-05-24 LAB — BASIC METABOLIC PANEL
Anion gap: 9 (ref 5–15)
BUN: 13 mg/dL (ref 6–20)
CALCIUM: 8.4 mg/dL — AB (ref 8.9–10.3)
CHLORIDE: 105 mmol/L (ref 101–111)
CO2: 21 mmol/L — ABNORMAL LOW (ref 22–32)
Creatinine, Ser: 0.61 mg/dL (ref 0.61–1.24)
GFR calc Af Amer: >60 mL/min (ref >60)
GFR calc non Af Amer: >60 mL/min (ref >60)
GLUCOSE: 124 mg/dL — AB (ref 65–99)
Potassium: 3.4 mmol/L — ABNORMAL LOW (ref 3.5–5.1)
Sodium: 135 mmol/L (ref 135–145)

## 2014-05-24 LAB — CBC
HEMATOCRIT: 37.1 % — AB (ref 39.0–52.0)
Hemoglobin: 12.3 g/dL — ABNORMAL LOW (ref 13.0–17.0)
MCH: 31.8 pg (ref 26.0–34.0)
MCHC: 33.2 g/dL (ref 30.0–36.0)
MCV: 95.9 fL (ref 78.0–100.0)
Platelets: 219 10*3/uL (ref 150–400)
RBC: 3.87 MIL/uL — AB (ref 4.22–5.81)
RDW: 14.4 % (ref 11.5–15.5)
WBC: 6.2 10*3/uL (ref 4.0–10.5)

## 2014-05-24 LAB — GLUCOSE, CAPILLARY: GLUCOSE-CAPILLARY: 126 mg/dL — AB (ref 65–99)

## 2014-05-24 MED ORDER — ADULT MULTIVITAMIN W/MINERALS CH: 1.0000 | ORAL_TABLET | Freq: Every day | ORAL | Status: AC

## 2014-05-24 MED ORDER — HEPARIN SOD (PORK) LOCK FLUSH 100 UNIT/ML IV SOLN
250.0000 [IU] | INTRAVENOUS | Status: AC | PRN
  Administered 2014-05-24: 250 [IU]

## 2014-05-24 MED ORDER — PRO-STAT SUGAR FREE PO LIQD: 30.0000 mL | Freq: Two times a day (BID) | ORAL | Status: DC

## 2014-05-24 MED ORDER — TRAMADOL HCL 50 MG PO TABS: 100.0000 mg | ORAL_TABLET | Freq: Four times a day (QID) | ORAL | Status: DC | PRN

## 2014-05-24 MED ORDER — HYDRALAZINE HCL 25 MG PO TABS: 25.0000 mg | ORAL_TABLET | Freq: Three times a day (TID) | ORAL | Status: AC

## 2014-05-24 MED ORDER — DILTIAZEM HCL ER COATED BEADS 360 MG PO CP24: 360.0000 mg | ORAL_CAPSULE | Freq: Every day | ORAL | Status: DC

## 2014-05-24 MED ORDER — FOLIC ACID 1 MG PO TABS: 1.0000 mg | ORAL_TABLET | Freq: Every day | ORAL | Status: DC

## 2014-05-24 MED ORDER — CEFTRIAXONE SODIUM IN DEXTROSE 40 MG/ML IV SOLN: 2.0000 g | INTRAVENOUS | Status: AC

## 2014-05-24 MED ORDER — POLYETHYLENE GLYCOL 3350 17 G PO PACK: 17.0000 g | PACK | Freq: Every day | ORAL | Status: DC

## 2014-05-24 MED ORDER — THIAMINE HCL 100 MG PO TABS: 100.0000 mg | ORAL_TABLET | Freq: Every day | ORAL | Status: DC

## 2014-05-24 MED ORDER — ENSURE ENLIVE PO LIQD: 237.0000 mL | Freq: Two times a day (BID) | ORAL | Status: DC

## 2014-05-24 MED ORDER — SACCHAROMYCES BOULARDII 250 MG PO CAPS
250.0000 mg | ORAL_CAPSULE | Freq: Two times a day (BID) | ORAL | Status: DC
Start: 2014-05-24 — End: 2021-10-07

## 2014-05-24 NOTE — Discharge Summary (Addendum)
Physician Discharge Summary  [name] MVH:[id number] DOB: [birth year] DOA: 05/04/2014  PCP: Lujean Amel, MD  Admit date: 05/04/2014 Discharge date: 05/24/2014   Recommendations for Outpatient Follow-Up:   1. The patient is being discharged to a SNF for rehabilitation. 2. Follow-up arranged to see Dr. Berenice Primas of orthopedic surgery on 06/07/14 at 9:30 am. 3. Once the patient completes his recommended course of Rocephin therapy, ID recommends indefinite suppressive therapy with oral amoxicillin once drain is out and he is in the recovery phase. 4. The patient will need follow-up drain care with interventional radiology.   Discharge Diagnosis:   Principal Problem:    Sepsis secondary to group D Streptococcus abscess/prosthetic joint infection with bacteremia Active Problems:    New onset atrial fibrillation    Diabetes mellitus type 2, controlled    Essential hypertension    Psoas abscess, right    Iliopsoas abscess on right    Alcoholism    Streptococcal bacteremia    Acute encephalopathy    SDH (subdural hematoma)    Encephalopathy acute    Septic arthritis of hip    Iliopsoas abscess    Right carotid artery stenosis 40-59 percent   Discharge disposition:  SNF:  Discharge Condition: Improved.  Diet recommendation: Carbohydrate-modified.    Wound care: Has abscess drain in place.  Change hip dressing as needed for leakage.   History of Present Illness:   [name] is an 79 y.o. male with a PMH of chronic alcoholism, diabetes and hypertension who was admitted 05/04/14 with a one-week history of right hip and RLQ abdominal pain associated with fever. CT of abdomen and pelvis showed iliopsoas abscess. Patient also noted to have new onset atrial fibrillation, treated with Cardizem drip. Cardiology subsequently consulted and 2-D echo done. The patient's CHADS2vasc score is 4, but he was felt to be at undue high risk for anticoagulation given issues with  compliance and ongoing alcoholism. Blood cultures grew strep bovis. Patient subsequently underwent iliopsoas abscess drainage with cultures growing strep viridans. ID has been following the patient. During the course of his hospitalization, the patient was consistently confused and an MRI was done 05/11/14 which showed a subdural hematoma and small CVA. Neurology was consulted. Antiplatelet therapy held secondary to subdural hematoma.   Hospital Course by Problem:   Principal Problem:  Sepsis/streptococcus D bacteremia secondary to right iliopsoas abscess and septic arthritis of hip - Unsuccessful drain placement of the right iliopsoas abscess 05/06/2014 and successfully repeated on 05/15/2014. - Dr. Berenice Primas of orthopedic surgery consulted with recommendations for hardware removal however patient declined. - Status post right hip aspiration on 05/16/2014 with purulent material, cultures grew strep group D. - Inserted PICC on 05/17/2014 in anticipation of prolonged antibiotic therapy. - Repeat CT abd and pelvis 05/21/14 showed improved abscess, no need for additional drain placement. - Initial blood cultures positive for group D streptococcus. Repeat blood culture on 05/08/2014 negative.  - Family d/w with orthopedic surgeon that they would like conservative therapy and re-evaluation as an outpatient. - On Rocephin for 6 weeks per ID recommendations, then can be transitioned to oral amoxicillin indefinitely.  - Due to high risk of C. Diff., PPI on hold. Continue Florastor.  - CPR /ESR elevated, and may need definitive therapy with hardware removal at some point in the future.  Active Problems:  New onset atrial fibrillation - Rate controlled on Cardizem drip, which has been transitioned to oral Cardizem. - TSH WNL. - 2-D echo showed preserved EF, mild mitral  and tricuspid regurgitation. - Not a candidate for anticoagulation secondary to bilateral subdural hematoma, noncompliance, and chronic  alcohol abuse.   Diabetes mellitus type 2, controlled - Resume metformin at discharge.   Essential hypertension - He will be discharged on Cardizem and hydralazine. Metoprolol discontinued given need for Cardizem.   Alcoholism - Status post detoxification with Ativan per CIWA protocol. - Continue to supplement thiamine and folic acid.   Acute encephalopathy - Was treated with Haldol as needed. - Ammonia level was 29 on admission.   SDH (subdural hematoma) - MRI of the head 05/11/2014 show subdural hematoma on the left MCA territory. - Neurology consulted and recommended no interventions at this time.   Medical Consultants:    Infectious disease  Interventional radiology  Neurology  Critical care  Orthopedics  Cardiology  General surgery   Discharge Exam:   Filed Vitals:   05/24/14 0954  BP: 133/73  Pulse: 92  Temp:   Resp:    Filed Vitals:   05/23/14 1329 05/23/14 2112 05/24/14 0541 05/24/14 0954  BP: 142/79 144/79 141/75 133/73  Pulse: 82 81 86 92  Temp: 97.9 F (36.6 C) 97.6 F (36.4 C) 97.3 F (36.3 C)   TempSrc: Oral Oral Oral   Resp: 18 18 18    Height:      Weight:   102.3 kg (225 lb 8.5 oz)   SpO2: 98% 97% 98%     Gen: NAD Cardiovascular: RRR, No M/R/G Respiratory: Lungs with bilateral rhonchi Gastrointestinal: Abdomen soft, NT/ND, + BS Extremities: 1+ edema, venous stasis dermatitis   The results of significant diagnostics from this hospitalization (including imaging, microbiology, ancillary and laboratory) are listed below for reference.     Procedures and Diagnostic Studies:   Dg Abd 1 View  05/11/2014   CLINICAL DATA:  79 year old male with a history of nasogastric tube placement  EXAM: ABDOMEN - 1 VIEW  COMPARISON:  None.  FINDINGS: Gas within small bowel and colon. No abnormally distended small bowel loops.  Tip with surgical tubing projects within the mid mediastinum, below the level of the carina, potentially in the  distal esophagus. No gastric tube within the left upper quadrant.  Calcifications of the aorta.  Formed stool within the rectum.  Surgical changes of right hip arthroplasty  IMPRESSION: Tip of gastric tube projects within the distal esophagus, below the level of the carina though not in the stomach.  Atherosclerosis.  Unremarkable bowel gas pattern with formed stool in the rectum, better characterized on recent prior CT.  Signed,  Dulcy Fanny. Earleen Newport, DO  [facility]  [facility]   Electronically Signed   By: Corrie Mckusick D.O.   On: 05/11/2014 12:32   Ct Head Wo Contrast  05/11/2014   CLINICAL DATA:  Encephalopathy, agitation  EXAM: CT HEAD WITHOUT CONTRAST  TECHNIQUE: Contiguous axial images were obtained from the base of the skull through the vertex without intravenous contrast.  COMPARISON:  None.  FINDINGS: Motion degraded images.  No evidence of parenchymal hemorrhage or extra-axial fluid collection. No mass lesion, mass effect, or midline shift.  No CT evidence of acute infarction.  Subcortical white matter and periventricular small vessel ischemic changes.  Global cortical atrophy.  Secondary ventricular prominence.  The visualized paranasal sinuses are essentially clear. The mastoid air cells are unopacified. Nasoenteric tube.  No evidence of calvarial fracture.  IMPRESSION: Motion degraded images.  No evidence of acute intracranial abnormality.  Atrophy with small vessel ischemic changes.   Electronically  Signed   By: Julian Hy M.D.   On: 05/11/2014 15:49   Ct Angio Chest Pe W/cm &/or Wo Cm  05/11/2014   CLINICAL DATA:  Tachycardia, fevers  EXAM: CT ANGIOGRAPHY CHEST WITH CONTRAST  TECHNIQUE: Multidetector CT imaging of the chest was performed using the standard protocol during bolus administration of intravenous contrast. Multiplanar CT image reconstructions and MIPs were obtained to evaluate the vascular anatomy.  CONTRAST:  141m OMNIPAQUE IOHEXOL  350 MG/ML SOLN  COMPARISON:  Plain film from earlier in the same day.  FINDINGS: Images are significantly limited by patient motion artifact. The lungs show mild dependent atelectasis. No focal confluent infiltrate is seen. Small bilateral pleural effusions are noted. A nasogastric catheter is noted in place. The thoracic aorta demonstrates a normal branching pattern is well as significant calcific plaque. No dissection or aneurysmal dilatation is seen.  The pulmonary artery shows no large central pulmonary embolus. Evaluation of the smaller vessels is limited due to patient motion artifact. No significant hilar or mediastinal adenopathy is noted. Coronary calcifications are seen. Scanning into the upper abdomen demonstrates mild ascites. The osseous structures show no acute abnormality.  Review of the MIP images confirms the above findings.  IMPRESSION: Severely limited exam. Mild dependent atelectasis and small effusions are seen.  Mild ascites is noted.  No definitive pulmonary emboli are seen.   Electronically Signed   By: MInez CatalinaM.D.   On: 05/11/2014 15:50   Ct Pelvis W Contrast  05/15/2014   CLINICAL DATA:  Reassess known right iliopsoas abscess. Previously drained. Patient continues to have fevers.  EXAM: CT PELVIS WITH CONTRAST  TECHNIQUE: Multidetector CT imaging of the pelvis was performed using the standard protocol following the bolus administration of intravenous contrast.  CONTRAST:  1033mOMNIPAQUE IOHEXOL 300 MG/ML  SOLN  COMPARISON:  05/10/2014 and 05/04/2014  FINDINGS: Right hip prosthesis causes moderate streak artifact. Again noted is patient's fluid collection within the right iliopsoas muscle/bursa at the level of the hip and extending superiorly to approximately the L5 level. This measures approximately 4 x 4.7 cm in transverse in AP dimension at the level of the right acetabulum (previously 3.8 x 5.1 cm). No adjacent osteolysis.  Ascites unchanged. Moderate calcified plaque over the  abdominal aorta and iliac arteries. Small paraumbilical hernia and small bilateral inguinal hernias unchanged. Moderate diverticulosis of the colon and moderate fecal retention over the rectum. Mild degenerate change of the left hip and moderate degenerative change of the spine. Remainder the exam is unchanged.  IMPRESSION: Continued evidence of patient's known right iliopsoas muscle/bursal abscess measuring 4 x 4.7 cm in transverse in AP dimension (previously 3.8 x 5.1 cm).  Other chronic findings as described unchanged.   Electronically Signed   By: DaMarin Olp.D.   On: 05/15/2014 16:54   Ct Pelvis W Contrast  05/04/2014   CLINICAL DATA:  Right hip pain and fever for 3 days. History of total hip replacement 2009.  EXAM: CT PELVIS WITH CONTRAST  TECHNIQUE: Multidetector CT imaging of the pelvis was performed using the standard protocol following the bolus administration of intravenous contrast.  CONTRAST:  8021mMNIPAQUE IOHEXOL 300 MG/ML  SOLN  COMPARISON:  05/04/2014 and MR 02/26/2010  FINDINGS: There is soft tissue density fullness with faint rim enhancement along the lower aspect of the right iliopsoas muscle. This measures 5.2 x 3.5 x 8.5 cm. Centrally this measures soft tissue density. Considerations include abscess, phlegmon, myositis, or other soft tissue mass. Further evaluation with  MRI may be helpful. Patient has had previous right total hip arthroplasty. Prosthetic components appear well seated. There is no evidence for periprosthetic loosening. Degenerative changes are seen in the lower lumbar spine. There is dense atherosclerosis of the abdominal aorta. There is significant sigmoid diverticulosis.  IMPRESSION: 1. Rim enhancing mass along the lower aspect of the iliopsoas muscle, suspicious for abscess or phlegmon. Myositis or other soft tissue mass is also a consideration. Further evaluation with MRI may be helpful. 2. Status post right total hip arthroplasty without adverse features. 3.  Significant sigmoid diverticulosis.   Electronically Signed   By: Nolon Nations M.D.   On: 05/04/2014 20:39   Mr Brain Wo Contrast  05/11/2014   ADDENDUM REPORT: 05/11/2014 21:44  ADDENDUM: Study discussed by telephone with Provider TRogue Bussing on 05/11/2014 at 2140 hours.  In discussion of the findings, it occurs to me that the small cortical focus of diffusion abnormality along the left frontal lobe might alternatively reflect a small cerebral contusion (on the same side as the small left subdural hematoma).  I inquired about the possibility of recent fall, but Provider Rogue Bussing was not aware of and did not immediately find documentation of any recent trauma.   Electronically Signed   By: Genevie Ann M.D.   On: 05/11/2014 21:44   05/11/2014   CLINICAL DATA:  79 year old male with increased agitation and confusion. Acute encephalopathy. Tachycardia and fever. Right iliopsoas abscess. Initial encounter.  EXAM: MRI HEAD WITHOUT CONTRAST  TECHNIQUE: Multiplanar, multiecho pulse sequences of the brain and surrounding structures were obtained without intravenous contrast.  COMPARISON:  Head CT without contrast 1525 hours today.  FINDINGS: Study is intermittently degraded by motion artifact despite repeated imaging attempts.  Cerebral volume is within normal limits for age. No ventriculomegaly. No midline shift or significant intracranial mass effect.  Subtle left subdural hematoma is identified (series 7, images 15 and 16). This is up to 2 mm in thickness. There is mild associated diffusion artifact (series 402, image 17). However, there is a smaller more intense focus of increased trace diffusion signal along the left frontal operculum on series 402, image 20. This is also seen on coronal diffusion (series 5, image 27). No associated T2 or FLAIR hyperintensity.  No other diffusion abnormality. Major intracranial vascular flow voids are within normal limits.  No other intracranial hemorrhage is identified. Patchy  nonspecific periventricular white matter T2 and FLAIR hyperintensity corresponds hypodensity on the recent CT. No temporal lobe edema or signal abnormality is identified. Deep gray matter nuclei, brainstem and cerebellum are within normal limits for age. Negative pituitary, cervicomedullary junction and visualized cervical spine.  Visible internal auditory structures appear normal. Mastoids are clear. Trace paranasal sinus mucosal thickening. Postoperative changes to the right globe. Visualized scalp soft tissues are within normal limits. Visualized bone marrow signal is within normal limits.  IMPRESSION: 1. Tiny left subdural hematoma, up to 2 mm in thickness. No associated mass effect. This is currently occult by CT. 2. Evidence of a tiny cortically based infarct in the low anterior left MCA territory. No mass effect or associated hemorrhage. 3. Mild to moderate for age nonspecific cerebral white matter signal changes  Electronically Signed: By: Genevie Ann M.D. On: 05/11/2014 21:31   Ct Abdomen Pelvis W Contrast  05/21/2014   CLINICAL DATA:  RIGHT psoas abscess. RIGHT hip pain. Follow-up. Drainage of the RIGHT psoas abscess. Evaluate for collection.  EXAM: CT ABDOMEN AND PELVIS WITH CONTRAST  TECHNIQUE: Multidetector CT imaging of  the abdomen and pelvis was performed using the standard protocol following bolus administration of intravenous contrast.  CONTRAST:  123m OMNIPAQUE IOHEXOL 300 MG/ML  SOLN  COMPARISON:  None.  FINDINGS: Dependent atelectasis in the lungs. Small volume of ascites remains present. Atherosclerosis. Unchanged appearance of the liver and spleen. Small hiatal hernia. Stomach and gallbladder grossly appear within normal limits. There is no intra-abdominal free air. Dense aortoiliac atherosclerosis. Small and large bowel appears similar to prior exam. Small RIGHT iliopsoas abscess remains present although decreased in size when compared to the prior exam of 05/10/2014. This is draining through  the pigtail drainage catheter anterior to the RIGHT hip. The abscess today measures 3.5 cm x 2.1 cm (image 72 series 2).  Review of the bone windows shows no evidence of discitis/osteomyelitis by CT in this patient with psoas abscess. Unchanged degenerative nitrogen gas in the sacroiliac joints.  IMPRESSION: 1. Pigtail drainage catheter is in the fluid collection anterior to the RIGHT hip, which probably communicates with the RIGHT hip arthroplasty. 2. Iliopsoas abscess has significantly improved compared to prior. Small residual iliopsoas abscess measures 35 mm x 21 mm (image 72 series 2). 3. Small volume ascites.   Electronically Signed   By: GDereck LigasM.D.   On: 05/21/2014 15:42   Ct Abdomen Pelvis W Contrast  05/10/2014   CLINICAL DATA:  Fever, follow-up iliopsoas abscess. History of right hip replacement.  EXAM: CT ABDOMEN AND PELVIS WITH CONTRAST  TECHNIQUE: Multidetector CT imaging of the abdomen and pelvis was performed using the standard protocol following bolus administration of intravenous contrast.  CONTRAST:  1070mOMNIPAQUE IOHEXOL 300 MG/ML  SOLN  COMPARISON:  CT pelvis dated 05/04/2014  FINDINGS: Motion degraded images.  Lower chest:  Small bilateral pleural effusions.  Cardiomegaly.  Coronary atherosclerosis.  Hepatobiliary: Liver is grossly unremarkable.  Gallbladder is severely motion degraded. No intrahepatic or extrahepatic ductal dilatation.  Pancreas: Within normal limits.  Spleen: Severely motion degraded but grossly unremarkable.  Adrenals/Urinary Tract: Adrenal glands are motion degraded but unremarkable.  Kidneys are grossly unremarkable.  No hydronephrosis.  Bladder is within normal limits.  Stomach/Bowel: Stomach is poorly evaluated but notable for a small hiatal hernia.  No evidence of bowel obstruction.  Colonic diverticulosis.  Moderate distal colonic stool burden.  Vascular/Lymphatic: Atherosclerotic calcifications of the abdominal aorta and branch vessels.  No suspicious  abdominopelvic lymphadenopathy  Reproductive: Prostate is grossly unremarkable  Other: Moderate abdominopelvic ascites.  Small fat containing bilateral inguinal hernias.  Musculoskeletal: 5.1 x 3.8 cm right iliopsoas fluid collection/abscess (series 2/image 78), previously 5.3 x 3.5 cm, grossly unchanged.  Mild body wall edema.  Degenerative changes of the visualized thoracolumbar spine.  IMPRESSION: Motion degraded images.  5.1 x 3.8 cm right iliopsoas fluid collection/abscess, grossly unchanged.  Small bilateral pleural effusions. Moderate abdominopelvic ascites. Mild body wall edema.  No evidence of bowel obstruction. Moderate distal colonic stool burden.   Electronically Signed   By: SrJulian Hy.D.   On: 05/10/2014 15:45   Ct Aspiration  05/16/2014   CLINICAL DATA:  [age]ear old male with right psoas hematoma which is suspected to be superinfected. There is also clinical concern for possible hip joint infection.  EXAM: CT IMAGE GUIDED DRAINAGE BY PERCUTANEOUS CATHETER; CT GUIDANCE NEEDLE PLACEMENT  Date: 05/16/2014  PROCEDURE: 1. Aspiration right hip joint under CT guidance 2. Placement of a 14 French drain into the right psoas hematoma/abscess Interventional Radiologist:  HeCriselda PeachesMD  ANESTHESIA/SEDATION: Moderate (conscious) sedation was used. 4 mg  Versed, 50 mcg Fentanyl were administered intravenously. The patient's vital signs were monitored continuously by radiology nursing throughout the procedure.  Sedation Time: 32 minutes  MEDICATIONS: None additional  TECHNIQUE: Informed consent was obtained from the patient following explanation of the procedure, risks, benefits and alternatives. The patient understands, agrees and consents for the procedure. All questions were addressed. A time out was performed.  A planning axial CT scan was performed. Suitable skin entry sites were selected and marked. The region was then sterilely prepped and draped in standard fashion using Betadine skin  prep.  Attention was first turned to the hip joint aspiration. Using a far lateral approach, an 18 gauge trocar needle was advanced over the greater tuberosity and into the hip joint. CT fluoroscopic imaging was used but with limited effect secondary to the extensive streak artifact surrounding the hip prosthesis. However, the needle tip was confirmed within the joint space by the tactile feedback of metal on metal. Aspiration yielded approximately 10 mL of thick, brown purulent fluid. A sample was sent for Gram stain, cell count and culture. The needle was removed.  Attention was next turned to the psoas collection. Local anesthesia was attained by infiltration with 1% lidocaine. Using intermittent CT fluoroscopic guidance, an 18 gauge trocar needle was advanced into the fluid collection. An Amplatz wire was then coiled within the fluid collection and the tract dilated to 12 [place]. A Cook 58 [place] all-purpose drainage catheter was then advanced over the wire and coiled within the psoas collection. Aspiration yields approximately 120 mL of thick, brown purulent fluid. The appearance of the fluid is almost identical to that which was aspirated from the hip joint suggesting a frank communication.  The drain was then flushed with sterile saline and connected to JP bulb drainage. The drain was secured to the skin with 0 Prolene suture and an adhesive fixation device. The patient tolerated the procedure well.  COMPLICATIONS: None  IMPRESSION: 1. Aspiration of right hip joint yields approximately 10 mL thick, brown purulent fluid. Grossly, this fluid is infected. Samples were sent for Gram stain, cell count and culture. 2. Placement of a 86 French drain into the right psoas collection. Of note, the psoas collection was drained more inferiorly than previously attempted in the superior and medial aspect of the thigh rather than within the anatomic pelvis. Aspiration yields 120 mL thick, brown purulent fluid. The  appearance of the fluid is identical to that aspirated from the hip joint. These results were called by telephone at the time of interpretation on 05/16/2014 at 5:00 pm to Dr. Dr. Wende Neighbors, who verbally acknowledged these results.   Electronically Signed   By: Jacqulynn Cadet M.D.   On: 05/16/2014 17:55   Ct Aspiration  05/07/2014   CLINICAL DATA:  Right hip and pelvic pain, fever. Right psoas process on recent CT. Recent aspiration was nondiagnostic.  EXAM: CT GUIDED NEEDLE ASPIRATE BIOPSY OF RIGHT PSOAS PROCESS  PROCEDURE: The procedure risks, benefits, and alternatives were explained to the family. Questions regarding the procedure were encouraged and answered. The family understands and consents to the procedure.  Select axial scans through the lower pelvis were obtained and the collection was localized. An appropriate skin entry site was determined and marked.  The operative field was prepped with Betadinein a sterile fashion, and a sterile drape was applied covering the operative field. A sterile gown and sterile gloves were used for the procedure. Local anesthesia was provided with 1% Lidocaine.  Under CT fluoroscopic  guidance, an 18 gauge trocar needle was advanced into the lesion. Once needle tip position was confirmed, aspiration returned only 5 mL of bloody purulent material. The patient tolerated the procedure well.  COMPLICATIONS: None immediate  FINDINGS: Asymmetric enlargement right psoas musculature. Aspiration of the right psoas process returned 5 mL of bloody purulent material. This was sent for Gram stain, culture and sensitivity.  IMPRESSION: Aspiration of right psoas process returns 5 mL bloody purulent material, sent for Gram stain, culture and sensitivity.   Electronically Signed   By: Lucrezia Europe M.D.   On: 05/07/2014 16:40   Ct Aspiration  05/06/2014   CLINICAL DATA:  79 year old male with a history of bacteremia and sepsis.  CT imaging demonstrates a heterogeneous collection within the  right psoas musculature. He has been referred for potential drainage of a presumed abscess.  EXAM: CT GUIDED DRAINAGE OF RIGHT PSOAS ABSCESS  ANESTHESIA/SEDATION: Zero Mg IV Versed 0 mcg IV Fentanyl  Total Moderate Sedation Time:  0 minutes  PROCEDURE: The procedure, risks, benefits, and alternatives were explained to the patient's family. Questions regarding the procedure were encouraged and answered. The patient understands and consents to the procedure.  Patient is position in the supine position on the CT gantry table. CT scan of the pelvis was acquired.  The the right inguinal region was prepped with Betadinein a sterile fashion, and a sterile drape was applied covering the operative field. A sterile gown and sterile gloves were used for the procedure. Local anesthesia was provided with 1% Lidocaine.  Once the patient is prepped and draped sterilely, the skin and subcutaneous tissues overlying the right psoas were generously infiltrated with 1% lidocaine for local anesthesia. A small stab incision was made with 11 blade scalpel.  A trocar needle was advanced with CT guidance into the greatest diameter of the collection involving the right psoas. Once we confirmed the tip of the needle with imaging, the stylet was removed and a wire was placed.  Serial dilation of the soft tissues was performed, and then a 10 [place] drain was placed into the collection.  Vigorous aspiration was attempted while manipulating the 10 French tube. No fluid was aspirated.  The patient tolerated the procedure well and remained hemodynamically stable throughout.  No complications were encountered and no blood loss was encounter.  A sterile dressing was placed.  COMPLICATIONS: None  FINDINGS: CT study of the pelvis demonstrates heterogeneously dense collection within the right psoas musculature, similar in size to the comparison CT. No significant peripheral stranding.  When placing the needle into the collection, the patient experienced  some discomfort.  CT images demonstrate placement of pigtail catheter into the lateral collection, safely lateral to the femoral vasculature as well as the inguinal canal.  No fluid could be aspirated with vigorous aspiration and replacement of the tube. Upon withdrawal of the drain no fluid could be aspirated.  IMPRESSION: Status post attempted aspiration of dense fluid collection within the right psoas musculature. No fluid could be aspirated once the 10 French drain was placed into the fluid collection, suggesting non liquified hematoma at this time. No drain was left, as this could lead to potential seeding with bacteria of a sterile hematoma.  Signed,  Dulcy Fanny. Earleen Newport, DO  [facility]  [facility]   Electronically Signed   By: Corrie Mckusick D.O.   On: 05/06/2014 16:29   Dg Chest Port 1 View  05/18/2014   CLINICAL DATA:  PICC line placement.  EXAM: PORTABLE CHEST - 1 VIEW  COMPARISON:  May 11, 2014.  FINDINGS: Stable cardiomediastinal silhouette. No pneumothorax or pleural effusion is noted. Interval placement of right-sided PICC line with distal tip overlying expected position of the SVC. Both lungs are clear. Status post bilateral shoulder arthroplasty.  IMPRESSION: Interval placement of right-sided PICC line with distal tip overlying expected position of the SVC.   Electronically Signed   By: Marijo Conception, M.D.   On: 05/18/2014 18:32   Dg Chest Port 1 View  05/11/2014   CLINICAL DATA:  Altered mental status, history of hypertension, atrial fibrillation, diabetes.  EXAM: PORTABLE CHEST - 1 VIEW  COMPARISON:  Portable chest x-ray of May 08, 2014  FINDINGS: The lungs are reasonably well inflated and exhibit coarse interstitial markings bilaterally especially in the right infrahilar region. There is no pleural effusion. The cardiac silhouette is mildly enlarged. The mediastinum is normal in width. There is dense calcification in the wall of the ascending and  proximal descending thoracic aorta.  IMPRESSION: Subsegmental atelectasis or early interstitial infiltrate in the right infrahilar region. This could be related to aspiration. There is no pulmonary edema.   Electronically Signed   By: David  Martinique M.D.   On: 05/11/2014 09:40   Dg Chest Port 1 View  05/08/2014   CLINICAL DATA:  Dyspnea.  Unresponsive.  EXAM: PORTABLE CHEST - 1 VIEW  COMPARISON:  05/06/2014  FINDINGS: There is moderate unchanged cardiomegaly. No airspace consolidation is evident. There is no large effusion.  IMPRESSION: Unchanged cardiomegaly. No airspace consolidation or large effusion.   Electronically Signed   By: Andreas Newport M.D.   On: 05/08/2014 05:52   Dg Chest Port 1 View  05/06/2014   CLINICAL DATA:  Fever and worsening shortness of breath began today.  EXAM: PORTABLE CHEST - 1 VIEW  COMPARISON:  05/04/2014  FINDINGS: Patient motion degrades film quality. Patient has had prior bilateral shoulder arthroplasty. The heart is enlarged. Suspect developing infiltrate at the left lung base. Mild interstitial edema noted.  IMPRESSION: 1. Cardiomegaly and mild edema. 2. Suspect developing infiltrate in the left lower lobe.   Electronically Signed   By: Nolon Nations M.D.   On: 05/06/2014 17:31   Dg Chest Port 1 View  05/04/2014   CLINICAL DATA:  Fever.  Fatigue.  Altered mental status.  EXAM: PORTABLE CHEST - 1 VIEW  COMPARISON:  05/13/2010  FINDINGS: Heart size is within normal limits and stable allowing for portable technique. Atherosclerotic calcification of thoracic aorta again noted. Both lungs are clear. No evidence of pneumothorax or pleural effusion. Bilateral shoulder prostheses again noted.  IMPRESSION: Stable exam.  No active disease.   Electronically Signed   By: Earle Gell M.D.   On: 05/04/2014 16:25   Dg Knee Complete 4 Views Right  05/04/2014   CLINICAL DATA:  Fatigue and fever. Right lower extremity pain. Altered mental status.  EXAM: RIGHT KNEE - COMPLETE 4+ VIEW   COMPARISON:  None.  FINDINGS: Total knee prosthesis observed. No fracture or complicating feature identified. Polymer spacer remains in place.  Distal SFA and popliteal artery atherosclerotic calcification.  IMPRESSION: 1. Total knee prosthesis without complicating feature identified. 2. Atherosclerosis.   Electronically Signed   By: Van Clines M.D.   On: 05/04/2014 16:28   Dg Hip Unilat With Pelvis 2-3 Views Right  05/04/2014   CLINICAL DATA:  Right lateral hip pain. History of arthroplasty. Altered mental status. Denies fall. Fatigue.  EXAM: RIGHT HIP (WITH  PELVIS) 2-3 VIEWS  COMPARISON:  05/13/2010  FINDINGS: Patient has had previous right hip arthroplasty. There is no evidence for loosening or subluxation. There is dense atherosclerotic calcification of the femoral arteries. Degenerative changes are noted in the lower lumbar spine. No acute fracture.  IMPRESSION: No evidence for acute  abnormality.   Electronically Signed   By: Nolon Nations M.D.   On: 05/04/2014 16:27   Ct Image Guided Drainage By Percutaneous Catheter  05/16/2014   CLINICAL DATA:  79 year old male with right psoas hematoma which is suspected to be superinfected. There is also clinical concern for possible hip joint infection.  EXAM: CT IMAGE GUIDED DRAINAGE BY PERCUTANEOUS CATHETER; CT GUIDANCE NEEDLE PLACEMENT  Date: 05/16/2014  PROCEDURE: 1. Aspiration right hip joint under CT guidance 2. Placement of a 14 French drain into the right psoas hematoma/abscess Interventional Radiologist:  Criselda Peaches, MD  ANESTHESIA/SEDATION: Moderate (conscious) sedation was used. 4 mg Versed, 50 mcg Fentanyl were administered intravenously. The patient's vital signs were monitored continuously by radiology nursing throughout the procedure.  Sedation Time: 32 minutes  MEDICATIONS: None additional  TECHNIQUE: Informed consent was obtained from the patient following explanation of the procedure, risks, benefits and alternatives. The  patient understands, agrees and consents for the procedure. All questions were addressed. A time out was performed.  A planning axial CT scan was performed. Suitable skin entry sites were selected and marked. The region was then sterilely prepped and draped in standard fashion using Betadine skin prep.  Attention was first turned to the hip joint aspiration. Using a far lateral approach, an 18 gauge trocar needle was advanced over the greater tuberosity and into the hip joint. CT fluoroscopic imaging was used but with limited effect secondary to the extensive streak artifact surrounding the hip prosthesis. However, the needle tip was confirmed within the joint space by the tactile feedback of metal on metal. Aspiration yielded approximately 10 mL of thick, brown purulent fluid. A sample was sent for Gram stain, cell count and culture. The needle was removed.  Attention was next turned to the psoas collection. Local anesthesia was attained by infiltration with 1% lidocaine. Using intermittent CT fluoroscopic guidance, an 18 gauge trocar needle was advanced into the fluid collection. An Amplatz wire was then coiled within the fluid collection and the tract dilated to 12 [place]. A Cook 13 [place] all-purpose drainage catheter was then advanced over the wire and coiled within the psoas collection. Aspiration yields approximately 120 mL of thick, brown purulent fluid. The appearance of the fluid is almost identical to that which was aspirated from the hip joint suggesting a frank communication.  The drain was then flushed with sterile saline and connected to JP bulb drainage. The drain was secured to the skin with 0 Prolene suture and an adhesive fixation device. The patient tolerated the procedure well.  COMPLICATIONS: None  IMPRESSION: 1. Aspiration of right hip joint yields approximately 10 mL thick, brown purulent fluid. Grossly, this fluid is infected. Samples were sent for Gram stain, cell count and culture. 2.  Placement of a 30 French drain into the right psoas collection. Of note, the psoas collection was drained more inferiorly than previously attempted in the superior and medial aspect of the thigh rather than within the anatomic pelvis. Aspiration yields 120 mL thick, brown purulent fluid. The appearance of the fluid is identical to that aspirated from the hip joint. These results were called by telephone at the time of interpretation on 05/16/2014 at 5:00  pm to Dr. Dr. Wende Neighbors, who verbally acknowledged these results.   Electronically Signed   By: Jacqulynn Cadet M.D.   On: 05/16/2014 17:55    2-D echocardiogram   05/06/14  Study Conclusions  - Left ventricle: The cavity size was normal. Wall thickness was increased in a pattern of severe LVH. Systolic function was normal. The estimated ejection fraction was in the range of 60% to 65%. Wall motion was normal; there were no regional wall motion abnormalities. The study is not technically sufficient to allow evaluation of LV diastolic function. - Aortic valve: Sclerosis without stenosis. There was trivial regurgitation. - Mitral valve: Mildly thickened leaflets . There was moderate regurgitation. - Left atrium: The atrium was at the upper limits of normal in size. - Right ventricle: The cavity size was moderately dilated. - Right atrium: The atrium was mildly dilated. - Tricuspid valve: There was moderate regurgitation. - Pulmonary arteries: PA peak pressure: 41 mm Hg (S). - Inferior vena cava: The vessel was dilated. The respirophasic diameter changes were blunted (< 50%), consistent with elevated central venous pressure.  Impressions:  - LVEF 60-65%, moderate LVH, aortic sclerosis with trivial AI, mild RAE, moderate MR and TR, RVSP 41 mmHg, dilated IVC.  EEG   05/11/14  Impression: This EEG is abnormal due to moderate diffuse slowing of the  Background.  Lower extremity Dopplers  05/11/14  Summary:  -  No evidence of deep vein thrombosis involving the right lower extremity. - No evidence of deep vein thrombosis involving the left lower extremity.   Carotid Dopplers  05/12/14  Summary: Right: 40-59% ICA stenosis. Bilateral: Vertebral artery flow is antegrade. Left: ICA not adequately evaluated because the patient became agitated.  Labs:   Basic Metabolic Panel:  Recent Labs Lab 05/20/14 1121 05/24/14 0415  NA 134* 135  K 4.0 3.4*  CL 104 105  CO2 19* 21*  GLUCOSE 144* 124*  BUN 16 13  CREATININE 0.69 0.61  CALCIUM 8.5* 8.4*   GFR Estimated Creatinine Clearance: 88.2 mL/min (by C-G formula based on Cr of 0.61). Liver Function Tests:  Recent Labs Lab 05/20/14 1121  AST 44*  ALT 39  ALKPHOS 107  BILITOT 0.7  PROT 6.2*  ALBUMIN 2.3*   Coagulation profile  Recent Labs Lab 05/20/14 1121  INR 1.27    CBC:  Recent Labs Lab 05/20/14 1121 05/24/14 0415  WBC 7.4 6.2  HGB 12.9* 12.3*  HCT 39.0 37.1*  MCV 97.0 95.9  PLT 299 219   CBG:  Recent Labs Lab 05/23/14 0745 05/23/14 1158 05/23/14 1727 05/23/14 2110 05/24/14 0755  GLUCAP 120* 156* 162* 115* 126*   Microbiology Recent Results (from the past 240 hour(s))  Body fluid culture     Status: None   Collection Time: 05/16/14  6:21 PM  Result Value Ref Range Status   Specimen Description SYNOVIAL RIGHT HIP  Final   Special Requests Normal  Final   Gram Stain   Final    ABUNDANT WBC PRESENT, PREDOMINANTLY PMN NO ORGANISMS SEEN Performed at [facility]    Culture   Final    FEW STREPTOCOCCUS GROUP D;high probability for S.bovis Note: CALLED TO KATIE BEESON 05/20/14 0800 BY SMITHERSJ Performed at [facility]    Report Status 05/20/2014 FINAL  Final   Organism ID, Bacteria STREPTOCOCCUS GROUP D;high probability for S.bovis  Final      Susceptibility   Streptococcus group d;high probability for s.bovis - MIC (ETEST)*    PENICILLIN .  047 SENSITIVE Sensitive     * FEW  STREPTOCOCCUS GROUP D;high probability for S.bovis  Body fluid culture     Status: None   Collection Time: 05/16/14  6:21 PM  Result Value Ref Range Status   Specimen Description ABSCESS RIGHT  Final   Special Requests Normal  Final   Gram Stain   Final    ABUNDANT WBC PRESENT,BOTH PMN AND MONONUCLEAR NO SQUAMOUS EPITHELIAL CELLS SEEN NO ORGANISMS SEEN Performed at [facility]    Culture   Final    FEW MICROAEROPHILIC STREPTOCOCCI Note: Standardized susceptibility testing for this organism is not available. Performed at [facility]    Report Status 05/20/2014 FINAL  Final     Discharge Instructions:       Discharge Instructions    Call MD for:  persistant nausea and vomiting    Complete by:  As directed      Call MD for:  severe uncontrolled pain    Complete by:  As directed      Call MD for:  temperature >100.4    Complete by:  As directed      Diet Carb Modified    Complete by:  As directed      Increase activity slowly    Complete by:  As directed      Walk with assistance    Complete by:  As directed      Walker     Complete by:  As directed             Medication List    STOP taking these medications        atenolol 50 MG tablet  Commonly known as:  TENORMIN     furosemide 20 MG tablet  Commonly known as:  LASIX      TAKE these medications        cefTRIAXone 40 MG/ML IVPB  Commonly known as:  ROCEPHIN  Inject 50 mLs (2 g total) into the vein daily.     celecoxib 200 MG capsule  Commonly known as:  CELEBREX  Take 200 mg by mouth 2 (two) times daily.     CRESTOR 10 MG tablet  Generic drug:  rosuvastatin  Take 10 mg by mouth daily. Takes one every other day     diltiazem 360 MG 24 hr capsule  Commonly known as:  CARDIZEM CD  Take 1 capsule (360 mg total) by mouth daily.     feeding supplement (ENSURE ENLIVE) Liqd  Take 237 mLs by mouth 2 (two) times daily between meals.     feeding supplement (PRO-STAT SUGAR FREE 64) Liqd   Place 30 mLs into feeding tube 2 (two) times daily.     folic acid 1 MG tablet  Commonly known as:  FOLVITE  Take 1 tablet (1 mg total) by mouth daily.     hydrALAZINE 25 MG tablet  Commonly known as:  APRESOLINE  Take 1 tablet (25 mg total) by mouth every 8 (eight) hours.     LYRICA 75 MG capsule  Generic drug:  pregabalin  Take 75 mg by mouth 2 (two) times daily. 94m in the morning and 1589mat night     metFORMIN 500 MG tablet  Commonly known as:  GLUCOPHAGE  Take 1,000 mg by mouth.     multivitamin with minerals Tabs tablet  Take 1 tablet by mouth daily.     polyethylene glycol packet  Commonly known as:  MIRALAX / GLYCOLAX  Take  17 g by mouth daily.     saccharomyces boulardii 250 MG capsule  Commonly known as:  FLORASTOR  Take 1 capsule (250 mg total) by mouth 2 (two) times daily.     thiamine 100 MG tablet  Take 1 tablet (100 mg total) by mouth daily.     traMADol 50 MG tablet  Commonly known as:  ULTRAM  Take 2 tablets (100 mg total) by mouth every 6 (six) hours as needed. Every 4 hours     ULORIC 80 MG Tabs  Generic drug:  Febuxostat  Take 1 tablet by mouth daily.       Follow-up Information    Follow up with Lujean Amel, MD.   Specialty:  Family Medicine   Contact information:   [address] [place] [address] [phone]       Follow up with Alta Corning, MD. Go on 06/07/2014.   Specialty:  Orthopedic Surgery   Why:  at 9:30am   For Post Hospitalization and hip intection Follow Up, Bring a list of medications or bottles., [employer] Take your Discharge Instruction to your appt, Arrive 30 minutes prior to appointment   Contact information:   [place] [address] [phone]        Time coordinating discharge: 40 minutes.  Signed:  Quintella Mura  Pager [phone] [facility] 05/24/2014, 11:25 AM

## 2014-05-24 NOTE — Clinical Social Work Placement (Signed)
Patient is set to discharge to [facility] today. Patient & daughter, [name] aware. Discharge packet given to RN, Gregary Signs. [facility] called for transport to pickup at 11:30am.    Raynaldo Opitz, [facility] Worker cell #: [phone]    CLINICAL SOCIAL WORK PLACEMENT  NOTE  Date:  05/24/2014  Patient Details  Name: [name] MRN: [id number] Date of Birth: 10-11-1935  Clinical Social Work is seeking post-discharge placement for this patient at the [place] level of care (*CSW will initial, date and re-position this form in  chart as items are completed):  Yes   Patient/family provided with [facility] Work Department's list of facilities offering this level of care within the geographic area requested by the patient (or if unable, by the patient's family).  Yes   Patient/family informed of their freedom to choose among providers that offer the needed level of care, that participate in Medicare, Medicaid or managed care program needed by the patient, have an available bed and are willing to accept the patient.  Yes   Patient/family informed of [facility]'s ownership interest in [facility] and [facility], as well as of the fact that they are under no obligation to receive care at these facilities.  PASRR submitted to EDS on 05/21/14     PASRR number received on 05/21/14     Existing PASRR number confirmed on       FL2 transmitted to all facilities in geographic area requested by pt/family on 05/21/14     FL2 transmitted to all facilities within larger geographic area on       Patient informed that his/her managed care company has contracts with or will negotiate with certain facilities, including the following:        Yes   Patient/family informed of bed offers received.  Patient chooses bed at [facility]     Physician recommends and patient chooses bed at      Patient to be  transferred to [facility] on 05/24/14.  Patient to be transferred to facility by [facility]     Patient family notified on 05/24/14 of transfer.  Name of family member notified:  patient's daughter, [name] via phone     PHYSICIAN       Additional Comment:    _______________________________________________ Standley Brooking, LCSW 05/24/2014, 10:19 AM

## 2014-05-24 NOTE — Progress Notes (Signed)
Patient with 92 F perc drain placement on 5/11 in right psoas hematoma/abscess. Reviewed case with Dr. Laurence Ferrari, will scheduled drain clinic appt at [facility] on 06/05/14 with CT pelvis with contrast. Anderson Malta scheduler of drain clinic will call patient for appt time.  Tsosie Billing PA-C Interventional Radiology  05/24/2014 3:15 PM

## 2014-05-31 ENCOUNTER — Other Ambulatory Visit (HOSPITAL_COMMUNITY): Payer: Self-pay | Admitting: Interventional Radiology

## 2014-05-31 DIAGNOSIS — K6812 Psoas muscle abscess: Secondary | ICD-10-CM

## 2014-06-19 ENCOUNTER — Inpatient Hospital Stay: Payer: Medicare Other | Admitting: Internal Medicine

## 2014-06-19 ENCOUNTER — Telehealth: Payer: Self-pay | Admitting: *Deleted

## 2014-06-19 NOTE — Telephone Encounter (Signed)
Patient at [facility] and [facility].  They were unaware of his hospital follow up.  Per Dr. Linus Salmons, RN gave verbal order to [facility], LPN to continue the IV Rocephin as ordered through 07/12/14.  Patient is rescheduled for hospital follow up on 7/7 9:00 with Dr. Linus Salmons. Confirmed this appointment with both Joelene Millin and the Financial controller.   Landis Gandy, RN

## 2014-07-12 ENCOUNTER — Ambulatory Visit (INDEPENDENT_AMBULATORY_CARE_PROVIDER_SITE_OTHER): Payer: Medicare Other | Admitting: Internal Medicine

## 2014-07-12 ENCOUNTER — Encounter: Payer: Self-pay | Admitting: Internal Medicine

## 2014-07-12 ENCOUNTER — Ambulatory Visit: Payer: Medicare Other | Admitting: Internal Medicine

## 2014-07-12 VITALS — BP 173/88 | HR 97 | Temp 98.3°F

## 2014-07-12 DIAGNOSIS — M009 Pyogenic arthritis, unspecified: Secondary | ICD-10-CM | POA: Diagnosis not present

## 2014-07-12 DIAGNOSIS — R7881 Bacteremia: Secondary | ICD-10-CM

## 2014-07-12 DIAGNOSIS — B954 Other streptococcus as the cause of diseases classified elsewhere: Secondary | ICD-10-CM

## 2014-07-12 DIAGNOSIS — K6812 Psoas muscle abscess: Secondary | ICD-10-CM

## 2014-07-12 DIAGNOSIS — B955 Unspecified streptococcus as the cause of diseases classified elsewhere: Secondary | ICD-10-CM

## 2014-07-12 MED ORDER — AMOXICILLIN 500 MG PO TABS: 500.0000 mg | ORAL_TABLET | Freq: Three times a day (TID) | ORAL | Status: DC

## 2014-07-12 NOTE — Assessment & Plan Note (Signed)
Had Strep bovis in blood and hip which can be associated with colonic polyps, he states he has had a colonoscopy though does not remember how long ago.  Will need GI follow up if not recent.  Will defer to his PCP who has the records.

## 2014-07-12 NOTE — Progress Notes (Signed)
   Subjective:    Patient ID: [name], male    DOB: [birth year], 79 y.o.   MRN: [id number]  HPI [name] is a 79 y.o. male with chronic alcoholism, DM, HTN and gout came in with complaint of pain in right abdomen and hip. History of hip replacement in right. Was febrile and also noted to be in Afib. CT revealed iliopsoas abscess which was drained and blood cultures with 1/2 Strep Group D. Also has been somnolent and requiring ativan for presumed DTs. High BPs and tachycardic due to the Afib. Came in to hospital with sepsis and had 1 blood culture positive for Strep bovis and noted Psoas abscess which grew Strep viridans.  Then with continued leukocytosis, he had his hip joint aspirated and noted pus and also grew Strep bovis.  He was seen by Dr. Berenice Primas who recommended removal of prosthetic hip.  Patient however refused despite the information that it will likely not clear without prosthesis removal.  He continued to refuse and plan was to continue with IV antibiotics for 6 weeks and then convert to oral chronic suppression with amoxicillin.    He was initially scheduled to return to [facility] 6/14 but no showed appt.     Review of Systems  Constitutional: Negative for fever and chills.  Gastrointestinal: Negative for nausea and diarrhea.  Musculoskeletal: Negative for arthralgias.  Skin: Negative for rash.  Neurological: Negative for dizziness and light-headedness.       Objective:   Physical Exam  Constitutional: He appears well-developed and well-nourished. No distress.  Cardiovascular: Normal rate, regular rhythm and normal heart sounds.   No murmur heard. Pulmonary/Chest: Effort normal and breath sounds normal. No respiratory distress.  Musculoskeletal: He exhibits no edema.  Moves hip well but limited exam in wheelchair  Skin: No rash noted.          Assessment & Plan:

## 2014-07-12 NOTE — Assessment & Plan Note (Signed)
Hip treated.  Continued pain but similar to pain he had prior to infection.  No other concerning signs of obvious infection.  Has hip movement.  No warmth.   I will transition him to oral amoxicillin 500 mg tid then have him return in 4-5 months and consider long term suppression with amoxicillin 500 mg bid.

## 2014-07-12 NOTE — Assessment & Plan Note (Signed)
Drains out, doing well.  Treated adequately IV.

## 2014-07-21 ENCOUNTER — Inpatient Hospital Stay (HOSPITAL_COMMUNITY): Payer: Medicare Other

## 2014-07-21 ENCOUNTER — Encounter (HOSPITAL_COMMUNITY): Payer: Self-pay | Admitting: Nurse Practitioner

## 2014-07-21 ENCOUNTER — Emergency Department (HOSPITAL_COMMUNITY): Payer: Medicare Other

## 2014-07-21 ENCOUNTER — Inpatient Hospital Stay (HOSPITAL_COMMUNITY)
Admission: EM | Admit: 2014-07-21 | Discharge: 2014-07-24 | DRG: 917 | Disposition: A | Discharge status: Home Health | Payer: Medicare Other | Attending: Internal Medicine | Admitting: Internal Medicine

## 2014-07-21 DIAGNOSIS — L89610 Pressure ulcer of right heel, unstageable: Secondary | ICD-10-CM | POA: Diagnosis present

## 2014-07-21 DIAGNOSIS — Z8673 Personal history of transient ischemic attack (TIA), and cerebral infarction without residual deficits: Secondary | ICD-10-CM

## 2014-07-21 DIAGNOSIS — J9601 Acute respiratory failure with hypoxia: Secondary | ICD-10-CM | POA: Diagnosis not present

## 2014-07-21 DIAGNOSIS — I10 Essential (primary) hypertension: Secondary | ICD-10-CM | POA: Diagnosis present

## 2014-07-21 DIAGNOSIS — E871 Hypo-osmolality and hyponatremia: Secondary | ICD-10-CM

## 2014-07-21 DIAGNOSIS — Z993 Dependence on wheelchair: Secondary | ICD-10-CM

## 2014-07-21 DIAGNOSIS — R0902 Hypoxemia: Secondary | ICD-10-CM

## 2014-07-21 DIAGNOSIS — Z96653 Presence of artificial knee joint, bilateral: Secondary | ICD-10-CM | POA: Diagnosis present

## 2014-07-21 DIAGNOSIS — L89620 Pressure ulcer of left heel, unstageable: Secondary | ICD-10-CM | POA: Diagnosis present

## 2014-07-21 DIAGNOSIS — R140 Abdominal distension (gaseous): Secondary | ICD-10-CM

## 2014-07-21 DIAGNOSIS — I4891 Unspecified atrial fibrillation: Secondary | ICD-10-CM | POA: Diagnosis present

## 2014-07-21 DIAGNOSIS — Z886 Allergy status to analgesic agent status: Secondary | ICD-10-CM

## 2014-07-21 DIAGNOSIS — R0600 Dyspnea, unspecified: Secondary | ICD-10-CM

## 2014-07-21 DIAGNOSIS — I959 Hypotension, unspecified: Secondary | ICD-10-CM | POA: Diagnosis present

## 2014-07-21 DIAGNOSIS — Z96641 Presence of right artificial hip joint: Secondary | ICD-10-CM | POA: Diagnosis present

## 2014-07-21 DIAGNOSIS — S065XAA Traumatic subdural hemorrhage with loss of consciousness status unknown, initial encounter: Secondary | ICD-10-CM

## 2014-07-21 DIAGNOSIS — Z87891 Personal history of nicotine dependence: Secondary | ICD-10-CM | POA: Diagnosis not present

## 2014-07-21 DIAGNOSIS — Z888 Allergy status to other drugs, medicaments and biological substances status: Secondary | ICD-10-CM | POA: Diagnosis not present

## 2014-07-21 DIAGNOSIS — S065X9A Traumatic subdural hemorrhage with loss of consciousness of unspecified duration, initial encounter: Secondary | ICD-10-CM

## 2014-07-21 DIAGNOSIS — Z008 Encounter for other general examination: Secondary | ICD-10-CM

## 2014-07-21 DIAGNOSIS — I5031 Acute diastolic (congestive) heart failure: Secondary | ICD-10-CM | POA: Diagnosis present

## 2014-07-21 DIAGNOSIS — Z79899 Other long term (current) drug therapy: Secondary | ICD-10-CM

## 2014-07-21 DIAGNOSIS — I9589 Other hypotension: Secondary | ICD-10-CM | POA: Diagnosis present

## 2014-07-21 DIAGNOSIS — T465X1A Poisoning by other antihypertensive drugs, accidental (unintentional), initial encounter: Principal | ICD-10-CM | POA: Diagnosis present

## 2014-07-21 DIAGNOSIS — L8990 Pressure ulcer of unspecified site, unspecified stage: Secondary | ICD-10-CM | POA: Insufficient documentation

## 2014-07-21 DIAGNOSIS — M109 Gout, unspecified: Secondary | ICD-10-CM | POA: Diagnosis present

## 2014-07-21 DIAGNOSIS — N179 Acute kidney failure, unspecified: Secondary | ICD-10-CM

## 2014-07-21 DIAGNOSIS — Z833 Family history of diabetes mellitus: Secondary | ICD-10-CM

## 2014-07-21 DIAGNOSIS — R001 Bradycardia, unspecified: Secondary | ICD-10-CM

## 2014-07-21 DIAGNOSIS — R031 Nonspecific low blood-pressure reading: Secondary | ICD-10-CM | POA: Diagnosis not present

## 2014-07-21 DIAGNOSIS — E119 Type 2 diabetes mellitus without complications: Secondary | ICD-10-CM

## 2014-07-21 DIAGNOSIS — M009 Pyogenic arthritis, unspecified: Secondary | ICD-10-CM | POA: Diagnosis present

## 2014-07-21 LAB — URINALYSIS, ROUTINE W REFLEX MICROSCOPIC
Glucose, UA: NEGATIVE mg/dL
Hgb urine dipstick: NEGATIVE
Ketones, ur: 15 mg/dL — AB
NITRITE: NEGATIVE
PH: 5 (ref 5.0–8.0)
Protein, ur: 30 mg/dL — AB
Specific Gravity, Urine: 1.024 (ref 1.005–1.030)
Urobilinogen, UA: 0.2 mg/dL (ref 0.0–1.0)

## 2014-07-21 LAB — CBC WITH DIFFERENTIAL/PLATELET
BASOS ABS: 0.1 10*3/uL (ref 0.0–0.1)
Basophils Relative: 1 % (ref 0–1)
Eosinophils Absolute: 0.1 10*3/uL (ref 0.0–0.7)
Eosinophils Relative: 2 % (ref 0–5)
HEMATOCRIT: 34.1 % — AB (ref 39.0–52.0)
Hemoglobin: 11.4 g/dL — ABNORMAL LOW (ref 13.0–17.0)
LYMPHS PCT: 34 % (ref 12–46)
Lymphs Abs: 2.8 10*3/uL (ref 0.7–4.0)
MCH: 31.3 pg (ref 26.0–34.0)
MCHC: 33.4 g/dL (ref 30.0–36.0)
MCV: 93.7 fL (ref 78.0–100.0)
Monocytes Absolute: 0.9 10*3/uL (ref 0.1–1.0)
Monocytes Relative: 11 % (ref 3–12)
NEUTROS ABS: 4.3 10*3/uL (ref 1.7–7.7)
NEUTROS PCT: 52 % (ref 43–77)
Platelets: 300 10*3/uL (ref 150–400)
RBC: 3.64 MIL/uL — ABNORMAL LOW (ref 4.22–5.81)
RDW: 15 % (ref 11.5–15.5)
WBC: 8.2 10*3/uL (ref 4.0–10.5)

## 2014-07-21 LAB — GLUCOSE, CAPILLARY: Glucose-Capillary: 108 mg/dL — ABNORMAL HIGH (ref 65–99)

## 2014-07-21 LAB — I-STAT TROPONIN, ED: TROPONIN I, POC: 0 ng/mL (ref 0.00–0.08)

## 2014-07-21 LAB — URINE MICROSCOPIC-ADD ON

## 2014-07-21 LAB — BASIC METABOLIC PANEL
Anion gap: 8 (ref 5–15)
BUN: 23 mg/dL — ABNORMAL HIGH (ref 6–20)
CALCIUM: 9.7 mg/dL (ref 8.9–10.3)
CO2: 23 mmol/L (ref 22–32)
Chloride: 98 mmol/L — ABNORMAL LOW (ref 101–111)
Creatinine, Ser: 1.42 mg/dL — ABNORMAL HIGH (ref 0.61–1.24)
GFR, EST AFRICAN AMERICAN: 53 mL/min — AB (ref >60)
GFR, EST NON AFRICAN AMERICAN: 46 mL/min — AB (ref >60)
Glucose, Bld: 132 mg/dL — ABNORMAL HIGH (ref 65–99)
Potassium: 5 mmol/L (ref 3.5–5.1)
SODIUM: 129 mmol/L — AB (ref 135–145)

## 2014-07-21 LAB — I-STAT CG4 LACTIC ACID, ED
LACTIC ACID, VENOUS: 1.44 mmol/L (ref 0.5–2.0)
Lactic Acid, Venous: 2.32 mmol/L (ref 0.5–2.0)

## 2014-07-21 LAB — BRAIN NATRIURETIC PEPTIDE: B Natriuretic Peptide: 676 pg/mL — ABNORMAL HIGH (ref 0.0–100.0)

## 2014-07-21 LAB — TROPONIN I: Troponin I: <0.03 ng/mL (ref <0.031)

## 2014-07-21 LAB — CREATININE, URINE, RANDOM: CREATININE, URINE: 37.28 mg/dL

## 2014-07-21 LAB — SODIUM, URINE, RANDOM: Sodium, Ur: <10 mmol/L

## 2014-07-21 MED ORDER — ONDANSETRON HCL 4 MG/2ML IJ SOLN: 4.0000 mg | Freq: Four times a day (QID) | INTRAMUSCULAR | Status: DC | PRN

## 2014-07-21 MED ORDER — POLYETHYLENE GLYCOL 3350 17 G PO PACK
17.0000 g | PACK | Freq: Every day | ORAL | Status: DC
Start: 2014-07-21 — End: 2014-07-24
  Filled 2014-07-21 (×4): qty 1

## 2014-07-21 MED ORDER — PREGABALIN 75 MG PO CAPS
150.0000 mg | ORAL_CAPSULE | Freq: Every day | ORAL | Status: DC
  Administered 2014-07-21 – 2014-07-23 (×3): 150 mg via ORAL
  Filled 2014-07-21 (×3): qty 2

## 2014-07-21 MED ORDER — VITAMIN B-1 100 MG PO TABS
100.0000 mg | ORAL_TABLET | Freq: Every day | ORAL | Status: DC
  Administered 2014-07-22 – 2014-07-24 (×3): 100 mg via ORAL
  Filled 2014-07-21 (×3): qty 1

## 2014-07-21 MED ORDER — SACCHAROMYCES BOULARDII 250 MG PO CAPS
250.0000 mg | ORAL_CAPSULE | Freq: Two times a day (BID) | ORAL | Status: DC
  Administered 2014-07-21 – 2014-07-23 (×5): 250 mg via ORAL
  Filled 2014-07-21 (×8): qty 1

## 2014-07-21 MED ORDER — ALBUTEROL SULFATE (2.5 MG/3ML) 0.083% IN NEBU: 2.5000 mg | INHALATION_SOLUTION | RESPIRATORY_TRACT | Status: DC | PRN

## 2014-07-21 MED ORDER — PREGABALIN 75 MG PO CAPS
75.0000 mg | ORAL_CAPSULE | Freq: Every day | ORAL | Status: DC
  Administered 2014-07-22 – 2014-07-24 (×3): 75 mg via ORAL
  Filled 2014-07-21 (×4): qty 1

## 2014-07-21 MED ORDER — ONDANSETRON HCL 4 MG PO TABS: 4.0000 mg | ORAL_TABLET | Freq: Four times a day (QID) | ORAL | Status: DC | PRN

## 2014-07-21 MED ORDER — VANCOMYCIN HCL 10 G IV SOLR
2000.0000 mg | Freq: Once | INTRAVENOUS | Status: DC
  Filled 2014-07-21: qty 2000

## 2014-07-21 MED ORDER — SODIUM CHLORIDE 0.9 % IJ SOLN
3.0000 mL | Freq: Two times a day (BID) | INTRAMUSCULAR | Status: DC
  Administered 2014-07-21 – 2014-07-24 (×6): 3 mL via INTRAVENOUS

## 2014-07-21 MED ORDER — ACETAMINOPHEN 325 MG PO TABS: 650.0000 mg | ORAL_TABLET | Freq: Four times a day (QID) | ORAL | Status: DC | PRN

## 2014-07-21 MED ORDER — ACETAMINOPHEN 650 MG RE SUPP: 650.0000 mg | Freq: Four times a day (QID) | RECTAL | Status: DC | PRN

## 2014-07-21 MED ORDER — PIPERACILLIN-TAZOBACTAM 3.375 G IVPB 30 MIN
3.3750 g | Freq: Once | INTRAVENOUS | Status: AC
Start: 2014-07-21 — End: 2014-07-21
  Administered 2014-07-21: 3.375 g via INTRAVENOUS
  Filled 2014-07-21: qty 50

## 2014-07-21 MED ORDER — AMOXICILLIN 500 MG PO CAPS
500.0000 mg | ORAL_CAPSULE | Freq: Three times a day (TID) | ORAL | Status: DC
Start: 2014-07-21 — End: 2014-07-24
  Administered 2014-07-21 – 2014-07-24 (×8): 500 mg via ORAL
  Filled 2014-07-21 (×11): qty 1

## 2014-07-21 MED ORDER — VANCOMYCIN HCL IN DEXTROSE 750-5 MG/150ML-% IV SOLN
750.0000 mg | Freq: Two times a day (BID) | INTRAVENOUS | Status: DC
  Filled 2014-07-21: qty 150

## 2014-07-21 MED ORDER — VANCOMYCIN HCL IN DEXTROSE 1-5 GM/200ML-% IV SOLN
1000.0000 mg | Freq: Once | INTRAVENOUS | Status: DC
  Filled 2014-07-21: qty 200

## 2014-07-21 MED ORDER — SODIUM CHLORIDE 0.9 % IV BOLUS (SEPSIS)
1000.0000 mL | INTRAVENOUS | Status: DC
  Administered 2014-07-21 (×2): 1000 mL via INTRAVENOUS

## 2014-07-21 MED ORDER — TRAMADOL HCL 50 MG PO TABS: 100.0000 mg | ORAL_TABLET | Freq: Four times a day (QID) | ORAL | Status: DC | PRN

## 2014-07-21 MED ORDER — ROSUVASTATIN CALCIUM 10 MG PO TABS
10.0000 mg | ORAL_TABLET | ORAL | Status: DC
  Administered 2014-07-23: 10 mg via ORAL
  Filled 2014-07-21: qty 1

## 2014-07-21 MED ORDER — HYDROCODONE-ACETAMINOPHEN 5-325 MG PO TABS: 1.0000 | ORAL_TABLET | ORAL | Status: DC | PRN

## 2014-07-21 MED ORDER — RIVAROXABAN 20 MG PO TABS
20.0000 mg | ORAL_TABLET | Freq: Every day | ORAL | Status: DC
  Administered 2014-07-22 – 2014-07-24 (×3): 20 mg via ORAL
  Filled 2014-07-21 (×4): qty 1

## 2014-07-21 MED ORDER — INSULIN ASPART 100 UNIT/ML ~~LOC~~ SOLN
0.0000 [IU] | Freq: Three times a day (TID) | SUBCUTANEOUS | Status: DC
  Administered 2014-07-22: 2 [IU] via SUBCUTANEOUS

## 2014-07-21 MED ORDER — VANCOMYCIN HCL IN DEXTROSE 1-5 GM/200ML-% IV SOLN
1000.0000 mg | INTRAVENOUS | Status: AC
Start: 2014-07-21 — End: 2014-07-21
  Administered 2014-07-21 (×2): 1000 mg via INTRAVENOUS
  Filled 2014-07-21: qty 200

## 2014-07-21 MED ORDER — INSULIN ASPART 100 UNIT/ML ~~LOC~~ SOLN: 0.0000 [IU] | Freq: Every day | SUBCUTANEOUS | Status: DC

## 2014-07-21 MED ORDER — PIPERACILLIN-TAZOBACTAM 3.375 G IVPB
3.3750 g | Freq: Three times a day (TID) | INTRAVENOUS | Status: DC
  Filled 2014-07-21: qty 50

## 2014-07-21 MED ORDER — FOLIC ACID 1 MG PO TABS
1.0000 mg | ORAL_TABLET | Freq: Every day | ORAL | Status: DC
  Administered 2014-07-22 – 2014-07-24 (×3): 1 mg via ORAL
  Filled 2014-07-21 (×3): qty 1

## 2014-07-21 NOTE — ED Notes (Signed)
Pt transported to CT. Will take to floor after CT.

## 2014-07-21 NOTE — Progress Notes (Signed)
UR COMPLETED  

## 2014-07-21 NOTE — ED Notes (Addendum)
Called and gave results of Lactic Acid to attending RN. Jerene Pitch, Therapist, sports.  EDP was unable to answer phone

## 2014-07-21 NOTE — ED Notes (Addendum)
Placed pt on 2 L nasal cannula.  Pt SpO2 86% RA when this RN entered room.  Notified [place] PA.

## 2014-07-21 NOTE — H&P (Signed)
PCP:  Lujean Amel, MD  [facility]thopedics Dr. Berenice Primas Referring provider Lorre Munroe   Chief Complaint:  Fatigue and Toe nail on left toe ready to fall off.   HPI: [name] is a 79 y.o. 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 Orthopedics have seen and her hospitalization recommended hardware removal well however patient has declined. Patient has undergone drain placement by interventional radiology he was treated initially with IV Rocephin x 6 weeks and  discharged to [facility] with PICC line then drain was removed on July 7th and he was transitioned to  amoxicillin indefinitely per ID consult. Repeat blood cultures from May 3 were negative.  His hospital stay was complicated by confusion he has undergone MRI on May 6 which showed subdural hematoma and small CVA neurology was consulted and at that time antiplatelets therapy was held secondary to subdural hematoma.   Also during hospitalization he developed new onset atrial fibrillation for which he was started on Cardizem IV then converted to by mouth. Her gram showed preserved EF mild mitral and tricuspid regurgitation. He was thought to be not a candidate for anticoagulation secondary to bilateral subdural hematoma and history of alcohol abuse.  On 07/18/14 he was started on Xarelto  by MD at [facility]. 3 days ago he was discharged home. He reports taking his home medications including atenolol and Lasix  on top of his prescribed Cardizem since then he has been feeling very fatigued and unwell every time he takes his medicine this usually wears off a few hours afterwards. He denies syncope no chest pain. His discharge summary patient was recommended to discontinue his atenolol and Lasix with patient states he doesn't remember that. She states that nobody is helping him with his medications he's been administering them himself.  No headache no confusion. No fever. He has home health nurse who comes to help him 3 times a week to take care of his decubitus ulcers on his feet. She noticed some problem with his great toe on the left and told him to go to ER. In ER he was found to be bradycardic and hypotensive. He was given  Bolus 1L x 2 recent infection patient was started on vancomycin and Zosyn. White blood cell, was found to be within normal limits patient was noted to have acute renal failure with cranial to 1.4 and evidence of hyponatremia. At the time of my evaluation bradycardia has improved his heart rate was noted to go up as high as 94.  Patient was noted to have new oxygen requirement up to 3 L shortness of breath got somewhat worse after administration of IV boluses. Of note patient has had an echogram done on May 1 that showed preserved EF but was insufficient and diastolic function he did have evidence of moderate MR and TR. Chest x-ray shows mild CHF.     Hospitalist was called for admission for acute renal failure, hyponatremia, bradycardia, hypoxia  Review of Systems:  Pertinent positives include:  fatigue,   Constitutional:  No weight loss, night sweats, Fevers, chills,weight loss  HEENT:  No headaches, Difficulty swallowing,Tooth/dental problems,Sore throat,  No sneezing, itching, ear ache, nasal congestion, post nasal drip,  Cardio-vascular:  No chest pain,  Orthopnea, PND, anasarca, dizziness, palpitations.no Bilateral lower extremity swelling  GI:  No heartburn, indigestion, abdominal pain, nausea, vomiting, diarrhea, change in bowel habits, loss of appetite, melena, blood in stool, hematemesis Resp:  no shortness of breath at rest. No dyspnea on exertion, No excess mucus, no productive cough, No non-productive cough, No coughing up of blood.No change in color of mucus.No wheezing. Skin:  no rash or lesions. No jaundice GU:  no dysuria, change in color of urine, no urgency or frequency. No straining to urinate.  No flank pain.  Musculoskeletal:  No joint pain or no joint swelling. No decreased range of motion. No back pain.  Psych:  No change in mood or affect. No depression or anxiety. No memory loss.  Neuro: no localizing neurological complaints, no tingling, no weakness, no double vision, no gait abnormality, no slurred speech, no confusion  Otherwise ROS are negative except for above, 10 systems were reviewed  Past Medical History: Past Medical History  Diagnosis Date  . Hypertension   . Arthritis   . Stenosis of right carotid artery     40-59 percent   . Sepsis 05/04/2014  . 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 New onset atrial fibrillation 05/04/2014  . Diabetes mellitus type 2, controlled 05/04/2014  . Essential hypertension 05/04/2014  . Gout 05/04/2014  . Psoas abscess, right   . Alcoholism   . Streptococcal bacteremia 05/09/2014  . Acute encephalopathy 05/09/2014  . Cerebral infarction due to embolism of left middle cerebral artery   . SDH (subdural hematoma) 05/12/2014  . Septic arthritis of hip 05/18/2014   Past Surgical History  Procedure Laterality Date  . Hip arthroplasty Right   . Joint replacement      right hip, bilateral knees  . Right cataract extraction       Medications: Prior to Admission medications   Medication Sig Start Date End Date Taking? Authorizing Provider  ALPHA LIPOIC ACID PO Take 1 capsule by mouth 2  (two) times daily. For gout   Yes Historical Provider, MD  amoxicillin (AMOXIL) 500 MG tablet Take 1 tablet (500 mg total) by mouth 3 (three) times daily. 07/12/14  Yes Thayer Headings, MD  atenolol (TENORMIN) 50 MG tablet Take 50 mg by mouth 2 (two) times daily.  04/16/14  Yes Historical Provider, MD  celecoxib (CELEBREX) 200 MG capsule Take 200 mg by mouth 2 (two) times daily. 02/26/14  Yes Historical Provider, MD  Cholecalciferol (D3-1000) 1000 UNITS capsule Take 1,000 Units by mouth daily.   Yes Historical Provider, MD  cloNIDine (CATAPRES) 0.2 MG tablet Take 0.2 mg by mouth 2 (two) times daily. 07/04/14  Yes Historical Provider, MD  CRESTOR 10 MG tablet Take 10 mg by mouth daily. Takes one every other day 03/05/14  Yes Historical Provider, MD  diltiazem (CARDIZEM CD) 360 MG 24 hr capsule Take 1 capsule (360 mg total) by mouth daily. 05/24/14  Yes Venetia Maxon Rama, MD  folic acid (FOLVITE) 1 MG tablet Take 1 tablet (1 mg total) by mouth daily. 05/24/14  Yes Venetia Maxon Rama, MD  hydrALAZINE (APRESOLINE) 25 MG tablet Take 1 tablet (25 mg total) by mouth every 8 (eight) hours. 05/24/14  Yes Venetia Maxon Rama, MD  LYRICA 75 MG capsule  Take 75-150 mg by mouth 2 (two) times daily. 75mg  in the morning and 150mg  at night 04/27/14  Yes Historical Provider, MD  metFORMIN (GLUCOPHAGE) 1000 MG tablet Take 1,000 mg by mouth daily with breakfast.  07/16/14  Yes Historical Provider, MD  Multiple Vitamin (MULTIVITAMIN WITH MINERALS) TABS tablet Take 1 tablet by mouth daily. 05/24/14  Yes Venetia Maxon Rama, MD  saccharomyces boulardii (FLORASTOR) 250 MG capsule Take 1 capsule (250 mg total) by mouth 2 (two) times daily. 05/24/14  Yes Venetia Maxon Rama, MD  thiamine 100 MG tablet Take 1 tablet (100 mg total) by mouth daily. 05/24/14  Yes Venetia Maxon Rama, MD  traMADol (ULTRAM) 50 MG tablet Take 2 tablets (100 mg total) by mouth every 6 (six) hours as needed. Every 4 hours 05/24/14  Yes Christina P Rama, MD  ULORIC 80 MG TABS Take 1  tablet by mouth daily. 02/22/14  Yes Historical Provider, MD  vitamin C (ASCORBIC ACID) 500 MG tablet Take 500 mg by mouth every other day.   Yes Historical Provider, MD  XARELTO 20 MG TABS tablet Take 20 mg by mouth every morning. 07/16/14  Yes Historical Provider, MD  Amino Acids-Protein Hydrolys (FEEDING SUPPLEMENT, PRO-STAT SUGAR FREE 64,) LIQD Place 30 mLs into feeding tube 2 (two) times daily. 05/24/14   Venetia Maxon Rama, MD  feeding supplement, ENSURE ENLIVE, (ENSURE ENLIVE) LIQD Take 237 mLs by mouth 2 (two) times daily between meals. 05/24/14   Venetia Maxon Rama, MD  polyethylene glycol (MIRALAX / GLYCOLAX) packet Take 17 g by mouth daily. 05/24/14   Venetia Maxon Rama, MD    Allergies:   Allergies  Allergen Reactions  . Allopurinol Other (See Comments)    Makes head 'feel funny'  . Atorvastatin Other (See Comments)  . Motrin [Ibuprofen] Other (See Comments)    Blisters in groin area    Social History:  Ambulatory    wheelchair bound,    From facility [facility]   reports that he has quit smoking. His smoking use included Cigarettes. He smoked 25.00 packs per day. He has never used smokeless tobacco. He reports that he drinks alcohol. He reports that he does not use illicit drugs.    Family History: family history includes Diabetes Mellitus II in his daughter.    Physical Exam: Patient Vitals for the past 24 hrs:  BP Temp Temp src Pulse Resp SpO2 Height Weight  07/21/14 1849 - - - - - 95 % - -  07/21/14 1845 - - - - - 92 % - -  07/21/14 1844 - - - - - 91 % - -  07/21/14 1843 - - - - - (!) 87 % - -  07/21/14 1838 - - - - - (!) 86 % - -  07/21/14 1836 - - - - - 91 % - -  07/21/14 1830 120/71 mmHg - - 61 13 (!) 89 % - -  07/21/14 1815 - - - (!) 37 17 92 % - -  07/21/14 1800 116/63 mmHg - - 68 19 - - -  07/21/14 1743 - - - - - - - 95.709 kg (211 lb)  07/21/14 1741 - - - - - - 5' 8.5" (1.74 m) 95.709 kg (211 lb)  07/21/14 1730 119/64 mmHg - - (!) 52 15 94 % - -  07/21/14  1715 123/81 mmHg - - (!) 39 13 91 % - -  07/21/14 1700 118/63 mmHg - - (!) 59 17 94 % - -  07/21/14 1645 115/58 mmHg - - 103 13 95 % - -  07/21/14 1637 108/64 mmHg - - (!) 44 14 96 % - -  07/21/14 1532 - 98.7 F (37.1 C) Rectal - - - - -  07/21/14 1531 - 98.7 F (37.1 C) Rectal - - - - -  07/21/14 1530 (!) 107/50 mmHg - - 85 16 94 % - -  07/21/14 1528 144/94 mmHg - - (!) 45 14 94 % - -  07/21/14 1500 109/58 mmHg - - (!) 39 14 96 % - -  07/21/14 1445 112/58 mmHg - - - 17 - - -  07/21/14 1402 94/60 mmHg 97.7 F (36.5 C) Oral 72 20 97 % - -    1. General:  in No Acute distress 2. Psychological: Alert and   Oriented 3. Head/ENT:   Dry  Mucous Membranes                          Head Non traumatic, neck supple                            Poor Dentition 4. SKIN: normal    Skin turgor,  Skin clean Dry and intact no rash 5. Heart: Regular rate and rhythm no Murmur, Rub or gallop 6. Lungs: no wheezes mild  crackles   7. Abdomen: Soft, non-tender, distended 8. Lower extremities: no clubbing, cyanosis, or edema 9. Neurologically Grossly intact, moving all 4 extremities equally 10. MSK: Normal range of motion  body mass index is 31.61 kg/(m^2).   Labs on Admission:   Results for orders placed or performed during the hospital encounter of 07/21/14 (from the past 24 hour(s))  CBC with Differential/Platelet     Status: Abnormal   Collection Time: 07/21/14  3:15 PM  Result Value Ref Range   WBC 8.2 4.0 - 10.5 K/uL   RBC 3.64 (L) 4.22 - 5.81 MIL/uL   Hemoglobin 11.4 (L) 13.0 - 17.0 g/dL   HCT 34.1 (L) 39.0 - 52.0 %   MCV 93.7 78.0 - 100.0 fL   MCH 31.3 26.0 - 34.0 pg   MCHC 33.4 30.0 - 36.0 g/dL   RDW 15.0 11.5 - 15.5 %   Platelets 300 150 - 400 K/uL   Neutrophils Relative % 52 43 - 77 %   Neutro Abs 4.3 1.7 - 7.7 K/uL   Lymphocytes Relative 34 12 - 46 %   Lymphs Abs 2.8 0.7 - 4.0 K/uL   Monocytes Relative 11 3 - 12 %   Monocytes Absolute 0.9 0.1 - 1.0 K/uL   Eosinophils Relative 2  0 - 5 %   Eosinophils Absolute 0.1 0.0 - 0.7 K/uL   Basophils Relative 1 0 - 1 %   Basophils Absolute 0.1 0.0 - 0.1 K/uL  Basic metabolic panel     Status: Abnormal   Collection Time: 07/21/14  3:15 PM  Result Value Ref Range   Sodium 129 (L) 135 - 145 mmol/L   Potassium 5.0 3.5 - 5.1 mmol/L   Chloride 98 (L) 101 - 111 mmol/L   CO2 23 22 - 32 mmol/L   Glucose, Bld 132 (H) 65 - 99 mg/dL   BUN 23 (H) 6 - 20 mg/dL   Creatinine, Ser 1.42 (H) 0.61 - 1.24 mg/dL   Calcium 9.7 8.9 - 10.3 mg/dL   GFR calc non Af Amer 46 (L) >60 mL/min  GFR calc Af Amer 53 (L) >60 mL/min   Anion gap 8 5 - 15  I-stat troponin, ED     Status: None   Collection Time: 07/21/14  3:28 PM  Result Value Ref Range   Troponin i, poc 0.00 0.00 - 0.08 ng/mL   Comment 3          I-Stat CG4 Lactic Acid, ED     Status: Abnormal   Collection Time: 07/21/14  4:15 PM  Result Value Ref Range   Lactic Acid, Venous 2.32 (HH) 0.5 - 2.0 mmol/L   Comment NOTIFIED PHYSICIAN   Urinalysis, Routine w reflex microscopic (not at [facility])     Status: Abnormal   Collection Time: 07/21/14  4:35 PM  Result Value Ref Range   Color, Urine AMBER (A) YELLOW   APPearance CLOUDY (A) CLEAR   Specific Gravity, Urine 1.024 1.005 - 1.030   pH 5.0 5.0 - 8.0   Glucose, UA NEGATIVE NEGATIVE mg/dL   Hgb urine dipstick NEGATIVE NEGATIVE   Bilirubin Urine SMALL (A) NEGATIVE   Ketones, ur 15 (A) NEGATIVE mg/dL   Protein, ur 30 (A) NEGATIVE mg/dL   Urobilinogen, UA 0.2 0.0 - 1.0 mg/dL   Nitrite NEGATIVE NEGATIVE   Leukocytes, UA TRACE (A) NEGATIVE  Urine microscopic-add on     Status: Abnormal   Collection Time: 07/21/14  4:35 PM  Result Value Ref Range   Squamous Epithelial / LPF FEW (A) RARE   WBC, UA 3-6 <3 WBC/hpf   RBC / HPF 0-2 <3 RBC/hpf   Bacteria, UA FEW (A) RARE   Casts HYALINE CASTS (A) NEGATIVE  I-Stat CG4 Lactic Acid, ED     Status: None   Collection Time: 07/21/14  6:14 PM  Result Value Ref Range   Lactic Acid, Venous 1.44 0.5 -  2.0 mmol/L    UA 3-6 WBC specific gravity 1.024  No results found for: HGBA1C  Estimated Creatinine Clearance: 49.3 mL/min (by C-G formula based on Cr of 1.42).  BNP (last 3 results) No results for input(s): PROBNP in the last 8760 hours.  Other results:  I have pearsonaly reviewed this: ECG REPORT  Rate: 44  Rhythm: Atrial fibrillation with slow response ST&T Change: No ischemic changes QTC 404  Filed Weights   07/21/14 1741 07/21/14 1743  Weight: 95.709 kg (211 lb) 95.709 kg (211 lb)     Cultures:    Component Value Date/Time   SDES SYNOVIAL RIGHT HIP 05/16/2014 1821   SDES ABSCESS RIGHT 05/16/2014 1821   SPECREQUEST Normal 05/16/2014 1821   SPECREQUEST Normal 05/16/2014 1821   CULT  05/16/2014 1821    FEW STREPTOCOCCUS GROUP D;high probability for S.bovis Note: CALLED TO KATIE BEESON 05/20/14 0800 BY SMITHERSJ Performed at [facility]  05/16/2014 1821    FEW MICROAEROPHILIC STREPTOCOCCI Note: Standardized susceptibility testing for this organism is not available. Performed at [facility] 05/20/2014 FINAL 05/16/2014 1821   REPTSTATUS 05/20/2014 FINAL 05/16/2014 1821     Radiological Exams on Admission: Dg Chest 2 View  07/21/2014   CLINICAL DATA:  Weakness for 3 days  EXAM: CHEST  2 VIEW  COMPARISON:  05/18/2014  FINDINGS: The heart size is moderately enlarged. There is aortic atherosclerosis noted. Coarsened interstitial markings are noted bilaterally. Small bilateral pleural effusions are identified. Prior bilateral shoulder arthroplasty.  IMPRESSION: Mild CHF.   Electronically Signed   By: Kerby Moors M.D.   On: 07/21/2014 16:20  Chart has been reviewed  Family   at  Bedside  plan of care was discussed with [facility] daughter Rhys Martini [phone]  Assessment/Plan 79 year old with complex medical history including alcoholism, intra-psoas abscess, history of CVA left MCA territory and small left subdural  hematoma here with bradycardia due to taking wrong meds  Present on Admission:  . Hyponatremia - will obtain urine electrolytes, he was given IVF bolus and surrently has new O2 requirement. Will hold off on additional IVF if BP allows may need gentle Lasix . Acute renal failure - Patient resume taken home dose of Lasix as well as have had some decreased by mouth intake. Urged department patient was given IV fluids. Currently appears to be euvolemic will repeat creatinine  . Transient hypotension - regular complication of intentional overdose of blood pressure medications this patient took his Cardizem as well as atenolol currently blood pressure has improved will monitor and step down overnight  . Bradycardia currently improved most likely secondary to overdose from Cardizem and atenolol currently heart rate has improved monitor step down if needed would benefit from glucagon drip or dopamine but currently stable  . Essential hypertension given transient hypotension will hold by mouth meds for now we will gently restart tomorrow  . Septic arthritis of hip continue home dose of amoxicillin History of atrial fibrillation currently rate controlled. Will restart home dose of Cardizem when able. Bradycardia was likely secondary to patient unintentionally taking atenolol in addition to Cardizem  History of CVA - as well as small subdural hematoma seen on MRI on May 6 repeat CT scan today showed no evidence of intracranial bleeding. discussed with neurology if patient able to restart his Xarelto and based on repeat CT no bleeding ok to give Xarelto   Prophylaxis: SCD     CODE STATUS:  FULL CODE   as per patient    Disposition: To home with home health needs someone to help with his meds                            Other plan as per orders.  I have spent a total of 100 min on this admission extra time was taken to discuss case with cardiology, radiology, Neurology and  PCCM  [place] 07/21/2014, 7:06 PM  [facility]  Pager [phone]   after 2 AM please page floor coverage PA If 7AM-7PM, please contact the day team taking care of the patient  [email or web address]  Password [email or web address]

## 2014-07-21 NOTE — ED Notes (Signed)
Attempt to call report to floor x1. 

## 2014-07-21 NOTE — Progress Notes (Signed)
ANTIBIOTIC CONSULT NOTE - INITIAL  Pharmacy Consult for vancomycin and zosyn Indication: rule out sepsis  Allergies  Allergen Reactions  . Allopurinol Other (See Comments)    Makes head 'feel funny'  . Atorvastatin Other (See Comments)  . Motrin [Ibuprofen] Other (See Comments)    Blisters in groin area    Patient Measurements: Height: 5' 8.5" (174 cm) Weight: 211 lb (95.709 kg) IBW/kg (Calculated) : 69.55  Vital Signs: Temp: 98.7 F (37.1 C) (07/16 1532) Temp Source: Rectal (07/16 1532) BP: 119/64 mmHg (07/16 1730) Pulse Rate: 52 (07/16 1730) Intake/Output from previous day:   Intake/Output from this shift:    Labs:  Recent Labs  07/21/14 1515  WBC 8.2  HGB 11.4*  PLT 300  CREATININE 1.42*   Estimated Creatinine Clearance: 49.3 mL/min (by C-G formula based on Cr of 1.42). No results for input(s): VANCOTROUGH, VANCOPEAK, VANCORANDOM, GENTTROUGH, GENTPEAK, GENTRANDOM, TOBRATROUGH, TOBRAPEAK, TOBRARND, AMIKACINPEAK, AMIKACINTROU, AMIKACIN in the last 72 hours.   Microbiology: No results found for this or any previous visit (from the past 720 hour(s)).  Medical History: Past Medical History  Diagnosis Date  . Hypertension   . Arthritis   . Stenosis of right carotid artery     40-59 percent   . Sepsis 05/04/2014  . 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 New onset atrial fibrillation 05/04/2014  . Diabetes mellitus type 2, controlled 05/04/2014  . Essential hypertension 05/04/2014  . Gout 05/04/2014  . Psoas abscess, right   . Alcoholism   . Streptococcal bacteremia 05/09/2014  . Acute encephalopathy 05/09/2014  . Cerebral infarction due to embolism of left middle cerebral artery   . SDH (subdural hematoma) 05/12/2014  . Septic arthritis of hip 05/18/2014   Assessment: CC: Weak and tired  PMH: DM, HTN, Septic prosthetic hip s/p IV Abx now on oral PO amoxicillin, afib on xarelto  ID: septic prosthetic hip. WBC 8.2, LA 2.32. AF. Pt previously therapeutic on 1250 mg IV q12 h  (SCr WNL in May 2016)  Vanc 7/16>> Zosyn 7/16>>  7/16 Blood x 2 7/16 Urine  Renal: SCr 1.42  Goal of Therapy:  Vancomycin trough level 15-20 mcg/ml  Plan:  -Initiate vancomycin 2000 mg x 1 then 750 mg IV q12h (Will start lower than previously tolerated doses based on renal fx) -Initiate Zosyn 3.375 gm IV q8h -F/u clinical status, renal function, culture results, VT prn  Levester Fresh, PharmD, [facility] Clinical Pharmacist Pager [phone] 07/21/2014 6:06 PM

## 2014-07-21 NOTE — ED Provider Notes (Signed)
CSN: [id number]     Arrival date & time 07/21/14  1353 History   First MD Initiated Contact with Patient 07/21/14 1500     Chief Complaint  Patient presents with  . Nail Problem  . Fatigue     (Consider location/radiation/quality/duration/timing/severity/associated sxs/prior Treatment) HPI Comments: Patient with hx of DM, HTN, sepsis, presents to the emergency department with chief complaint of weakness. He states that he was recently admitted for a septic prosthetic hip.  He was getting IV abx through a PICC and getting physical therapy at a skilled nursing facility.  Patient states that he has now transitioned to oral abx.  Patient states that he has been feeling very weak and tired lately.  States that his heart rate has been very slow.  He takes atenolol, hydralazine, and cardizem.  States that he is anticoagulated with Xarelto.  He denies any recent fever, cough, or dysuria.  Denies CP, SOB, or abdominal pain.  States that he also developed bed sores on bilateral heels while he was in the SNF.  Additionally, home health sent the patient to have a toenail looked at.  The history is provided by the patient. No language interpreter was used.    Past Medical History  Diagnosis Date  . Hypertension   . Arthritis   . Stenosis of right carotid artery     40-59 percent   . Sepsis 05/04/2014  . 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 New onset atrial fibrillation 05/04/2014  . Diabetes mellitus type 2, controlled 05/04/2014  . Essential hypertension 05/04/2014  . Gout 05/04/2014  . Psoas abscess, right   . Alcoholism   . Streptococcal bacteremia 05/09/2014  . Acute encephalopathy 05/09/2014  . Cerebral infarction due to embolism of left middle cerebral artery   . SDH (subdural hematoma) 05/12/2014  . Septic arthritis of hip 05/18/2014   Past Surgical History  Procedure Laterality Date  . Hip arthroplasty Right   . Joint replacement      right hip, bilateral knees  . Right cataract extraction     Family  History  Problem Relation Age of Onset  . Diabetes Mellitus II Daughter    History  Substance Use Topics  . Smoking status: Former Smoker -- 25.00 packs/day    Types: Cigarettes  . Smokeless tobacco: Never Used  . Alcohol Use: 0.0 oz/week    0 Standard drinks or equivalent per week     Comment: 3 glasses of vodka/day    Review of Systems  Constitutional: Positive for fatigue. Negative for fever and chills.  Respiratory: Negative for shortness of breath.   Cardiovascular: Negative for chest pain.  Gastrointestinal: Negative for nausea, vomiting, diarrhea and constipation.  Genitourinary: Negative for dysuria.  Neurological: Positive for weakness.  All other systems reviewed and are negative.     Allergies  Allopurinol and Motrin  Home Medications   Prior to Admission medications   Medication Sig Start Date End Date Taking? Authorizing Provider  Amino Acids-Protein Hydrolys (FEEDING SUPPLEMENT, PRO-STAT SUGAR FREE 64,) LIQD Place 30 mLs into feeding tube 2 (two) times daily. 05/24/14   Venetia Maxon Rama, MD  amoxicillin (AMOXIL) 500 MG tablet Take 1 tablet (500 mg total) by mouth 3 (three) times daily. 07/12/14   Thayer Headings, MD  celecoxib (CELEBREX) 200 MG capsule Take 200 mg by mouth 2 (two) times daily. 02/26/14   Historical Provider, MD  CRESTOR 10 MG tablet Take 10 mg by mouth daily. Takes one every other day 03/05/14  Historical Provider, MD  diltiazem (CARDIZEM CD) 360 MG 24 hr capsule Take 1 capsule (360 mg total) by mouth daily. 05/24/14   Venetia Maxon Rama, MD  feeding supplement, ENSURE ENLIVE, (ENSURE ENLIVE) LIQD Take 237 mLs by mouth 2 (two) times daily between meals. 05/24/14   Venetia Maxon Rama, MD  folic acid (FOLVITE) 1 MG tablet Take 1 tablet (1 mg total) by mouth daily. 05/24/14   Venetia Maxon Rama, MD  hydrALAZINE (APRESOLINE) 25 MG tablet Take 1 tablet (25 mg total) by mouth every 8 (eight) hours. 05/24/14   Christina P Rama, MD  LYRICA 75 MG capsule Take 75 mg by  mouth 2 (two) times daily. 75mg  in the morning and 150mg  at night 04/27/14   Historical Provider, MD  metFORMIN (GLUCOPHAGE) 500 MG tablet Take 1,000 mg by mouth. 02/24/14   Historical Provider, MD  Multiple Vitamin (MULTIVITAMIN WITH MINERALS) TABS tablet Take 1 tablet by mouth daily. 05/24/14   Venetia Maxon Rama, MD  polyethylene glycol (MIRALAX / GLYCOLAX) packet Take 17 g by mouth daily. 05/24/14   Venetia Maxon Rama, MD  saccharomyces boulardii (FLORASTOR) 250 MG capsule Take 1 capsule (250 mg total) by mouth 2 (two) times daily. 05/24/14   Venetia Maxon Rama, MD  thiamine 100 MG tablet Take 1 tablet (100 mg total) by mouth daily. 05/24/14   Venetia Maxon Rama, MD  traMADol (ULTRAM) 50 MG tablet Take 2 tablets (100 mg total) by mouth every 6 (six) hours as needed. Every 4 hours 05/24/14   Venetia Maxon Rama, MD  ULORIC 80 MG TABS Take 1 tablet by mouth daily. 02/22/14   Historical Provider, MD   BP 112/58 mmHg  Pulse 72  Temp(Src) 97.7 F (36.5 C) (Oral)  Resp 17  SpO2 97% Physical Exam  Constitutional: He is oriented to person, place, and time. He appears well-developed and well-nourished.  HENT:  Head: Normocephalic and atraumatic.  Eyes: Conjunctivae and EOM are normal. Pupils are equal, round, and reactive to light. Right eye exhibits no discharge. Left eye exhibits no discharge. No scleral icterus.  Neck: Normal range of motion. Neck supple. No JVD present.  Cardiovascular: Normal heart sounds.  Exam reveals no gallop and no friction rub.   No murmur heard. Bradycardic  Pulmonary/Chest: Effort normal and breath sounds normal. No respiratory distress. He has no wheezes. He has no rales. He exhibits no tenderness.  CTAB  Abdominal: Soft. He exhibits no distension and no mass. There is no tenderness. There is no rebound and no guarding.  No focal abdominal tenderness, no RLQ tenderness or pain at McBurney's point, no RUQ tenderness or Murphy's sign, no left-sided abdominal tenderness, no fluid wave,  or signs of peritonitis   Musculoskeletal: Normal range of motion. He exhibits no edema or tenderness.  Neurological: He is alert and oriented to person, place, and time.  Skin: Skin is warm and dry.  Bilateral heels with grade 1 pressure ulcers, no evidence of infection, onycomycosis noted on toe nails  Psychiatric: He has a normal mood and affect. His behavior is normal. Judgment and thought content normal.  Nursing note and vitals reviewed.   ED Course  Procedures (including critical care time) Results for orders placed or performed during the hospital encounter of 07/21/14  CBC with Differential/Platelet  Result Value Ref Range   WBC 8.2 4.0 - 10.5 K/uL   RBC 3.64 (L) 4.22 - 5.81 MIL/uL   Hemoglobin 11.4 (L) 13.0 - 17.0 g/dL   HCT 34.1 (L) 39.0 -  52.0 %   MCV 93.7 78.0 - 100.0 fL   MCH 31.3 26.0 - 34.0 pg   MCHC 33.4 30.0 - 36.0 g/dL   RDW 15.0 11.5 - 15.5 %   Platelets 300 150 - 400 K/uL   Neutrophils Relative % 52 43 - 77 %   Neutro Abs 4.3 1.7 - 7.7 K/uL   Lymphocytes Relative 34 12 - 46 %   Lymphs Abs 2.8 0.7 - 4.0 K/uL   Monocytes Relative 11 3 - 12 %   Monocytes Absolute 0.9 0.1 - 1.0 K/uL   Eosinophils Relative 2 0 - 5 %   Eosinophils Absolute 0.1 0.0 - 0.7 K/uL   Basophils Relative 1 0 - 1 %   Basophils Absolute 0.1 0.0 - 0.1 K/uL  Basic metabolic panel  Result Value Ref Range   Sodium 129 (L) 135 - 145 mmol/L   Potassium 5.0 3.5 - 5.1 mmol/L   Chloride 98 (L) 101 - 111 mmol/L   CO2 23 22 - 32 mmol/L   Glucose, Bld 132 (H) 65 - 99 mg/dL   BUN 23 (H) 6 - 20 mg/dL   Creatinine, Ser 1.42 (H) 0.61 - 1.24 mg/dL   Calcium 9.7 8.9 - 10.3 mg/dL   GFR calc non Af Amer 46 (L) >60 mL/min   GFR calc Af Amer 53 (L) >60 mL/min   Anion gap 8 5 - 15  Urinalysis, Routine w reflex microscopic (not at [facility])  Result Value Ref Range   Color, Urine AMBER (A) YELLOW   APPearance CLOUDY (A) CLEAR   Specific Gravity, Urine 1.024 1.005 - 1.030   pH 5.0 5.0 - 8.0   Glucose, UA  NEGATIVE NEGATIVE mg/dL   Hgb urine dipstick NEGATIVE NEGATIVE   Bilirubin Urine SMALL (A) NEGATIVE   Ketones, ur 15 (A) NEGATIVE mg/dL   Protein, ur 30 (A) NEGATIVE mg/dL   Urobilinogen, UA 0.2 0.0 - 1.0 mg/dL   Nitrite NEGATIVE NEGATIVE   Leukocytes, UA TRACE (A) NEGATIVE  Urine microscopic-add on  Result Value Ref Range   Squamous Epithelial / LPF FEW (A) RARE   WBC, UA 3-6 <3 WBC/hpf   RBC / HPF 0-2 <3 RBC/hpf   Bacteria, UA FEW (A) RARE   Casts HYALINE CASTS (A) NEGATIVE  I-stat troponin, ED  Result Value Ref Range   Troponin i, poc 0.00 0.00 - 0.08 ng/mL   Comment 3          I-Stat CG4 Lactic Acid, ED  Result Value Ref Range   Lactic Acid, Venous 2.32 (HH) 0.5 - 2.0 mmol/L   Comment NOTIFIED PHYSICIAN   I-Stat CG4 Lactic Acid, ED  Result Value Ref Range   Lactic Acid, Venous 1.44 0.5 - 2.0 mmol/L   Dg Chest 2 View  07/21/2014   CLINICAL DATA:  Weakness for 3 days  EXAM: CHEST  2 VIEW  COMPARISON:  05/18/2014  FINDINGS: The heart size is moderately enlarged. There is aortic atherosclerosis noted. Coarsened interstitial markings are noted bilaterally. Small bilateral pleural effusions are identified. Prior bilateral shoulder arthroplasty.  IMPRESSION: Mild CHF.   Electronically Signed   By: Kerby Moors M.D.   On: 07/21/2014 16:20      EKG Interpretation None      MDM   Final diagnoses:  Encounter for medical assessment  Hyponatremia  AKI (acute kidney injury)  Bradycardia  Hypoxia    Patient with weakness.  Recently admitted for septic prosthetic joint.  Had been getting IV abx  through PICC line, recently transitioned to orals.  During hospital stay was found to have new onset a-fib.   He is taking Xarelto and cardizem.  HR noted to be in the 40s.  This could be the reason for patient's weakness, but given recent sepsis, will broaden workup.   EKG reviewed with Dr. Thurnell Garbe.  Hyponatremia CRITICAL CARE Performed by: Montine Circle   Total critical care  time: 35  Critical care time was exclusive of separately billable procedures and treating other patients.  Critical care was necessary to treat or prevent imminent or life-threatening deterioration.  Critical care was time spent personally by me on the following activities: development of treatment plan with patient and/or surrogate as well as nursing, discussions with consultants, evaluation of patient's response to treatment, examination of patient, obtaining history from patient or surrogate, ordering and performing treatments and interventions, ordering and review of laboratory studies, ordering and review of radiographic studies, pulse oximetry and re-evaluation of patient's condition.  Medications  sodium chloride 0.9 % bolus 1,000 mL (1,000 mLs Intravenous New Bag/Given 07/21/14 1917)  vancomycin (VANCOCIN) IVPB 750 mg/150 ml premix (not administered)  piperacillin-tazobactam (ZOSYN) IVPB 3.375 g (not administered)  vancomycin (VANCOCIN) IVPB 1000 mg/200 mL premix (1,000 mg Intravenous New Bag/Given 07/21/14 1916)  piperacillin-tazobactam (ZOSYN) IVPB 3.375 g (0 g Intravenous Stopped 07/21/14 1846)     Patient discussed with Dr. Thurnell Garbe, who recommends admission.  Appreciate Dr. Roel Cluck for admitting the patient.  Montine Circle, PA-C 07/21/14 [place], DO 07/24/14 2208

## 2014-07-21 NOTE — ED Notes (Signed)
He states hes had a home health nurse coming to check on his feet because he is diabetic and his L great toenail is falling off. Nurse sent him to ER today for possible removal of L great toenail "because its looking worse." he denies pain

## 2014-07-21 NOTE — ED Notes (Signed)
[name], [place] aware lactic acid 2.3

## 2014-07-21 NOTE — ED Notes (Signed)
Pt aware of need for urine specimen. 

## 2014-07-21 NOTE — ED Notes (Signed)
Called Cardiology for Dr.Doutova; 2004

## 2014-07-21 NOTE — Progress Notes (Signed)
ANTICOAGULATION CONSULT NOTE - Initial Consult  Pharmacy Consult for xarelto Indication: atrial fibrillation  Allergies  Allergen Reactions  . Allopurinol Other (See Comments)    Makes head 'feel funny'  . Atorvastatin Other (See Comments)  . Motrin [Ibuprofen] Other (See Comments)    Blisters in groin area    Patient Measurements: Height: 5\' 8"  (172.7 cm) Weight: 214 lb 15.2 oz (97.5 kg) IBW/kg (Calculated) : 68.4  Vital Signs: Temp: 97.5 F (36.4 C) (07/16 2040) Temp Source: Oral (07/16 2040) BP: 107/62 mmHg (07/16 2040) Pulse Rate: 75 (07/16 2040)  Labs:  Recent Labs  07/21/14 1515  HGB 11.4*  HCT 34.1*  PLT 300  CREATININE 1.42*    Estimated Creatinine Clearance: 49.3 mL/min (by C-G formula based on Cr of 1.42).   Medical History: Past Medical History  Diagnosis Date  . Hypertension   . Arthritis   . Stenosis of right carotid artery     40-59 percent   . Sepsis 05/04/2014  . 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 New onset atrial fibrillation 05/04/2014  . Diabetes mellitus type 2, controlled 05/04/2014  . Essential hypertension 05/04/2014  . Gout 05/04/2014  . Psoas abscess, right   . Alcoholism   . Streptococcal bacteremia 05/09/2014  . Acute encephalopathy 05/09/2014  . Cerebral infarction due to embolism of left middle cerebral artery   . SDH (subdural hematoma) 05/12/2014  . Septic arthritis of hip 05/18/2014    Assessment: 70 yom with new afib as of 4/16, started xarelto 07/18/14. Scr elevated but based on TBW he is appropriate for full 20mg  dose. Based on med history it appears he takes the medication with breakfast, last dose given this am will schedule based on this information. No bleeding issues noted, CT head today shows no evidence of IC bleeding ok to resume xarelto.  Goal of Therapy:  Monitor platelets by anticoagulation protocol: Yes   Plan:  Xarelto 20mg  daily Follow for s/s of bleeding  Erin Hearing PharmD., BCPS Clinical Pharmacist Pager  [phone] 07/21/2014 9:53 PM

## 2014-07-21 NOTE — ED Notes (Signed)
Stop fluid bolus per verbal order from Dr. Roel Cluck.

## 2014-07-22 ENCOUNTER — Inpatient Hospital Stay (HOSPITAL_COMMUNITY): Payer: Medicare Other

## 2014-07-22 ENCOUNTER — Encounter (HOSPITAL_COMMUNITY): Payer: Self-pay | Admitting: *Deleted

## 2014-07-22 DIAGNOSIS — E871 Hypo-osmolality and hyponatremia: Secondary | ICD-10-CM

## 2014-07-22 DIAGNOSIS — N179 Acute kidney failure, unspecified: Secondary | ICD-10-CM

## 2014-07-22 DIAGNOSIS — R001 Bradycardia, unspecified: Secondary | ICD-10-CM

## 2014-07-22 DIAGNOSIS — L8990 Pressure ulcer of unspecified site, unspecified stage: Secondary | ICD-10-CM | POA: Insufficient documentation

## 2014-07-22 DIAGNOSIS — M009 Pyogenic arthritis, unspecified: Secondary | ICD-10-CM

## 2014-07-22 LAB — GLUCOSE, CAPILLARY
GLUCOSE-CAPILLARY: 144 mg/dL — AB (ref 65–99)
Glucose-Capillary: 108 mg/dL — ABNORMAL HIGH (ref 65–99)
Glucose-Capillary: 118 mg/dL — ABNORMAL HIGH (ref 65–99)
Glucose-Capillary: 81 mg/dL (ref 65–99)
Glucose-Capillary: 92 mg/dL (ref 65–99)

## 2014-07-22 LAB — CBC
HEMATOCRIT: 30.3 % — AB (ref 39.0–52.0)
Hemoglobin: 10.2 g/dL — ABNORMAL LOW (ref 13.0–17.0)
MCH: 31.5 pg (ref 26.0–34.0)
MCHC: 33.7 g/dL (ref 30.0–36.0)
MCV: 93.5 fL (ref 78.0–100.0)
Platelets: 224 10*3/uL (ref 150–400)
RBC: 3.24 MIL/uL — ABNORMAL LOW (ref 4.22–5.81)
RDW: 14.9 % (ref 11.5–15.5)
WBC: 7.1 10*3/uL (ref 4.0–10.5)

## 2014-07-22 LAB — COMPREHENSIVE METABOLIC PANEL
ALK PHOS: 61 U/L (ref 38–126)
ALT: 12 U/L — ABNORMAL LOW (ref 17–63)
ANION GAP: 4 — AB (ref 5–15)
AST: 22 U/L (ref 15–41)
Albumin: 3 g/dL — ABNORMAL LOW (ref 3.5–5.0)
BILIRUBIN TOTAL: 0.5 mg/dL (ref 0.3–1.2)
BUN: 23 mg/dL — AB (ref 6–20)
CHLORIDE: 101 mmol/L (ref 101–111)
CO2: 25 mmol/L (ref 22–32)
CREATININE: 1 mg/dL (ref 0.61–1.24)
Calcium: 8.8 mg/dL — ABNORMAL LOW (ref 8.9–10.3)
GLUCOSE: 140 mg/dL — AB (ref 65–99)
POTASSIUM: 4.2 mmol/L (ref 3.5–5.1)
Sodium: 130 mmol/L — ABNORMAL LOW (ref 135–145)
Total Protein: 6.1 g/dL — ABNORMAL LOW (ref 6.5–8.1)

## 2014-07-22 LAB — PROTIME-INR
INR: 1.95 — ABNORMAL HIGH (ref 0.00–1.49)
Prothrombin Time: 22.2 seconds — ABNORMAL HIGH (ref 11.6–15.2)

## 2014-07-22 LAB — MRSA PCR SCREENING: MRSA BY PCR: NEGATIVE

## 2014-07-22 LAB — TSH: TSH: 1.73 u[IU]/mL (ref 0.350–4.500)

## 2014-07-22 LAB — MAGNESIUM: Magnesium: 1.9 mg/dL (ref 1.7–2.4)

## 2014-07-22 LAB — OSMOLALITY, URINE: Osmolality, Ur: 192 mOsm/kg — ABNORMAL LOW (ref 390–1090)

## 2014-07-22 LAB — TROPONIN I: Troponin I: <0.03 ng/mL (ref <0.031)

## 2014-07-22 LAB — PHOSPHORUS: Phosphorus: 4.2 mg/dL (ref 2.5–4.6)

## 2014-07-22 MED ORDER — DILTIAZEM HCL 60 MG PO TABS
60.0000 mg | ORAL_TABLET | Freq: Three times a day (TID) | ORAL | Status: DC
  Administered 2014-07-22 – 2014-07-23 (×4): 60 mg via ORAL
  Filled 2014-07-22 (×9): qty 1

## 2014-07-22 MED ORDER — BISACODYL 10 MG RE SUPP: 10.0000 mg | Freq: Every day | RECTAL | Status: DC | PRN

## 2014-07-22 MED ORDER — DOCUSATE SODIUM 100 MG PO CAPS
100.0000 mg | ORAL_CAPSULE | Freq: Two times a day (BID) | ORAL | Status: DC
  Administered 2014-07-22 – 2014-07-24 (×4): 100 mg via ORAL
  Filled 2014-07-22 (×7): qty 1

## 2014-07-22 NOTE — Discharge Instructions (Signed)
Information on my medicine - XARELTO (Rivaroxaban)  This medication education was reviewed with me or my healthcare representative as part of my discharge preparation.  The pharmacist that spoke with me during my hospital stay was:  Kem Parkinson, [place]  Why was Xarelto prescribed for you? Xarelto was prescribed for you to reduce the risk of a blood clot forming that can cause a stroke if you have a medical condition called atrial fibrillation (a type of irregular heartbeat).  What do you need to know about xarelto ? Take your Xarelto ONCE DAILY at the same time every day with your evening meal. If you have difficulty swallowing the tablet whole, you may crush it and mix in applesauce just prior to taking your dose.  Take Xarelto exactly as prescribed by your doctor and DO NOT stop taking Xarelto without talking to the doctor who prescribed the medication.  Stopping without other stroke prevention medication to take the place of Xarelto may increase your risk of developing a clot that causes a stroke.  Refill your prescription before you run out.  After discharge, you should have regular check-up appointments with your healthcare provider that is prescribing your Xarelto.  In the future your dose may need to be changed if your kidney function or weight changes by a significant amount.  What do you do if you miss a dose? If you are taking Xarelto ONCE DAILY and you miss a dose, take it as soon as you remember on the same day then continue your regularly scheduled once daily regimen the next day. Do not take two doses of Xarelto at the same time or on the same day.   Important Safety Information A possible side effect of Xarelto is bleeding. You should call your healthcare provider right away if you experience any of the following: ? Bleeding from an injury or your nose that does not stop. ? Unusual colored urine (red or dark brown) or unusual colored stools (red or black). ? Unusual  bruising for unknown reasons. ? A serious fall or if you hit your head (even if there is no bleeding).  Some medicines may interact with Xarelto and might increase your risk of bleeding while on Xarelto. To help avoid this, consult your healthcare provider or pharmacist prior to using any new prescription or non-prescription medications, including herbals, vitamins, non-steroidal anti-inflammatory drugs (NSAIDs) and supplements.  This website has more information on Xarelto: [email or web address]

## 2014-07-22 NOTE — Progress Notes (Addendum)
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 He was on long-term antibiotics. He refused removal of the prosthetic joint. His hospital stay was also complicated by confusion and MRI revealed subdural hematoma and small stroke. Subsequently, developed atrial fibrillation. He was started on Cardizem and anticoagulation. He was discharged to a skilled nursing facility. He went home from the skilled nursing facility 3 days ago. He came in complaining of weakness and pain in his left first toe. He was found to be bradycardic and was brought into the hospital for further management.  Past medical history:  Past Medical History  Diagnosis Date  . Hypertension   . Arthritis   . Stenosis of right carotid artery     40-59 percent   . Sepsis 05/04/2014  . 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 New onset atrial fibrillation 05/04/2014  . Diabetes mellitus type 2, controlled 05/04/2014  . Essential hypertension 05/04/2014  . Gout 05/04/2014  . Psoas abscess, right   . Alcoholism   . Streptococcal bacteremia 05/09/2014  . Acute encephalopathy 05/09/2014  . Cerebral infarction due to embolism of left middle cerebral artery   . SDH (subdural hematoma) 05/12/2014  . Septic arthritis of hip 05/18/2014    Consultants: None  Procedures: None  Antibiotics: Amoxicillin being continued  Subjective: Patient feels much better this morning. He denies any chest pain or shortness of breath. No nausea, vomiting. No headaches.  Objective: Vital Signs  Filed Vitals:   07/21/14 2000 07/21/14 2040 07/21/14 2258 07/22/14 0408  BP: 129/76 107/62 110/74 103/62  Pulse: 62 75 74 60  Temp:  97.5 F (36.4 C) 97.6 F (36.4 C) 97.9 F (36.6 C)  TempSrc:   Oral Oral Oral  Resp: 16 22 18 16   Height:  5\' 8"  (1.727 m)    Weight:  97.5 kg (214 lb 15.2 oz)    SpO2: 92% 95% 94% 92%    Intake/Output Summary (Last 24 hours) at 07/22/14 0741 Last data filed at 07/21/14 2229  Gross per 24 hour  Intake      0 ml  Output    650 ml  Net   -650 ml   Filed Weights   07/21/14 1741 07/21/14 1743 07/21/14 2040  Weight: 95.709 kg (211 lb) 95.709 kg (211 lb) 97.5 kg (214 lb 15.2 oz)    General appearance: alert, cooperative, appears stated age and no distress Resp: Diminished air entry at the bases without any crackles or wheezing. Cardio: regular rate and rhythm, S1, S2 normal, no murmur, click, rub or gallop and Assessment S2 is irregularly irregular. No S3, S4. No rubs, murmurs, or bruit. No pedal edema. GI: soft, non-tender; bowel sounds normal; no masses,  no organomegaly Extremities: Dry blood noted around the nailbed area and the left first toe. No erythema noted.   Lab Results:  Basic Metabolic Panel:  Recent Labs Lab 07/21/14 1515 07/22/14 0234  NA 129* 130*  K 5.0 4.2  CL 98* 101  CO2 23 25  GLUCOSE 132* 140*  BUN 23* 23*  CREATININE 1.42* 1.00  CALCIUM 9.7 8.8*  MG  --  1.9  PHOS  --  4.2   Liver Function Tests:  Recent Labs Lab 07/22/14 0234  AST 22  ALT 12*  ALKPHOS 61  BILITOT 0.5  PROT 6.1*  ALBUMIN 3.0*   CBC:  Recent Labs Lab 07/21/14 1515 07/22/14 0234  WBC 8.2 7.1  NEUTROABS 4.3  --   HGB 11.4* 10.2*  HCT 34.1* 30.3*  MCV 93.7 93.5  PLT 300 224   Cardiac Enzymes:  Recent Labs Lab 07/21/14 2144 07/22/14 0234  TROPONINI <0.03 <0.03   BNP (last 3 results)  Recent Labs  07/21/14 1505  BNP 676.0*    CBG:  Recent Labs Lab 07/21/14 2057  GLUCAP 108*    Recent Results (from the past 240 hour(s))  MRSA PCR Screening     Status: None   Collection Time: 07/21/14  8:54 PM  Result Value Ref Range Status   MRSA by PCR NEGATIVE NEGATIVE Final    Comment:        The GeneXpert MRSA Assay  (FDA approved for NASAL specimens only), is one component of a comprehensive MRSA colonization surveillance program. It is not intended to diagnose MRSA infection nor to guide or monitor treatment for MRSA infections.       Studies/Results: Dg Chest 2 View  07/21/2014   CLINICAL DATA:  Weakness for 3 days  EXAM: CHEST  2 VIEW  COMPARISON:  05/18/2014  FINDINGS: The heart size is moderately enlarged. There is aortic atherosclerosis noted. Coarsened interstitial markings are noted bilaterally. Small bilateral pleural effusions are identified. Prior bilateral shoulder arthroplasty.  IMPRESSION: Mild CHF.   Electronically Signed   By: Kerby Moors M.D.   On: 07/21/2014 16:20   Dg Abd 1 View  07/21/2014   CLINICAL DATA:  Abdominal distension  EXAM: ABDOMEN - 1 VIEW  COMPARISON:  None.  FINDINGS: Scattered large and small bowel gas is noted. Postsurgical changes are noted in the right hip. Degenerative changes of the lumbar spine are seen. Diffuse aortic calcifications are noted. No free air is seen. Fecal material is noted throughout the colon.  IMPRESSION: Nonspecific abdomen.   Electronically Signed   By: Inez Catalina M.D.   On: 07/21/2014 22:03   Ct Head Wo Contrast  07/21/2014   CLINICAL DATA:  79 year old male with recent subdural hematoma.  EXAM: CT HEAD WITHOUT CONTRAST  TECHNIQUE: Contiguous axial images were obtained from the base of the skull through the vertex without intravenous contrast.  COMPARISON:  Head CT dated 05/11/2014  FINDINGS: There is slight prominence of the ventricles and sulci compatible with age-related volume loss. Periventricular and deep white matter hypodensities represent chronic microvascular ischemic changes. There is no intracranial hemorrhage. No mass effect or midline shift identified.  The visualized paranasal sinuses and mastoid air cells are well aerated. The calvarium is intact.  IMPRESSION: No acute intracranial pathology.  No hemorrhage.  Age-related  atrophy and chronic microvascular ischemic disease.   Electronically Signed   By: Anner Crete M.D.   On: 07/21/2014 21:00    Medications:  Scheduled: . amoxicillin  500 mg Oral TID  . diltiazem  60 mg Oral 3 times per day  . docusate sodium  100 mg Oral BID  . folic acid  1 mg Oral Daily  . insulin aspart  0-15 Units Subcutaneous TID WC  . insulin aspart  0-5 Units Subcutaneous QHS  . polyethylene glycol  17 g Oral Daily  . pregabalin  150 mg Oral QHS  . pregabalin  75 mg Oral Daily  . rivaroxaban  20 mg Oral QPC breakfast  . [START ON 07/23/2014] rosuvastatin  10 mg Oral Q48H  .  saccharomyces boulardii  250 mg Oral BID  . sodium chloride  3 mL Intravenous Q12H  . thiamine  100 mg Oral Daily   Continuous:  GUR:[id number] **OR** acetaminophen, albuterol, bisacodyl, HYDROcodone-acetaminophen, ondansetron **OR** ondansetron (ZOFRAN) IV, traMADol  Assessment/Plan:  Active Problems:   Diabetes mellitus type 2, controlled   Essential hypertension   Septic arthritis of hip   Hyponatremia   Acute renal failure   Transient hypotension   Bradycardia   Pressure ulcer    Fatigue Likely secondary to bradycardia. Improved this morning. We'll have PT and OT evaluate patient. No focal neurological deficits. No evidence for active infection.  Bradycardia This was secondary to the patient taking both Cardizem and beta blocker when the beta blocker had been discontinued. Patient understands that this was a mistake. Heart rate is improved. We will resume Cardizem in the form of short acting version. If heart rate remains stable, he can be transitioned back to his long-acting Cardizem tomorrow.   Hyponatremia Sodium levels are stable.  Acute renal failure Creatinine is improved. Monitor urine output  Mild hypoxia Chest x-ray was read as showing CHF. With examination does not suggest the same. Mobilize patient. Check saturations with ambulation.  Transient hypotension Likely  secondary to use of both Cardizem and beta blocker. Blood pressure has stabilized overnight.   Essential hypertension Blood pressure has stabilized overnight. Resuming Cardizem, mainly for heart rate control as discussed above.  Septic arthritis of hip Continue home dose of amoxicillin  History of atrial fibrillation Resume short-acting Cardizem as discussed earlier. Anticoagulation has been resumed as well.  History of CVA As well as small subdural hematoma seen on MRI on May 6. Repeat CT scan did not suggest any evidence for intracranial bleeding. Anticoagulation was resumed.  History of diabetes mellitus type 2 Continue sliding scale coverage.  DVT Prophylaxis: On full anticoagulation    Code Status: Full code  Family Communication: Discussed with the patient  Disposition Plan: Okay for transfer to telemetry. PT and OT evaluation. Anticipate discharge tomorrow.  Follow-up Appointment?: with PCP in 1 week.   LOS: 1 day   [facility] Pager [phone] 07/22/2014, 7:41 AM  If 7PM-7AM, please contact night-coverage at [email or web address], password [facility]

## 2014-07-22 NOTE — Progress Notes (Signed)
Patient left big toenail fell off on its own. Minimal bleeding from site. Cleansed with NS and covered with gauze and wrapped with gauze to avoid irritation of other toe against it.

## 2014-07-23 DIAGNOSIS — E119 Type 2 diabetes mellitus without complications: Secondary | ICD-10-CM

## 2014-07-23 DIAGNOSIS — J9601 Acute respiratory failure with hypoxia: Secondary | ICD-10-CM

## 2014-07-23 DIAGNOSIS — I10 Essential (primary) hypertension: Secondary | ICD-10-CM

## 2014-07-23 DIAGNOSIS — I5031 Acute diastolic (congestive) heart failure: Secondary | ICD-10-CM

## 2014-07-23 LAB — BASIC METABOLIC PANEL
Anion gap: 9 (ref 5–15)
BUN: 13 mg/dL (ref 6–20)
CALCIUM: 9.3 mg/dL (ref 8.9–10.3)
CHLORIDE: 108 mmol/L (ref 101–111)
CO2: 22 mmol/L (ref 22–32)
Creatinine, Ser: 0.53 mg/dL — ABNORMAL LOW (ref 0.61–1.24)
GFR calc non Af Amer: >60 mL/min (ref >60)
Glucose, Bld: 94 mg/dL (ref 65–99)
Potassium: 3.7 mmol/L (ref 3.5–5.1)
SODIUM: 139 mmol/L (ref 135–145)

## 2014-07-23 LAB — CBC
HCT: 31.6 % — ABNORMAL LOW (ref 39.0–52.0)
HEMOGLOBIN: 10.5 g/dL — AB (ref 13.0–17.0)
MCH: 30.8 pg (ref 26.0–34.0)
MCHC: 33.2 g/dL (ref 30.0–36.0)
MCV: 92.7 fL (ref 78.0–100.0)
PLATELETS: 222 10*3/uL (ref 150–400)
RBC: 3.41 MIL/uL — ABNORMAL LOW (ref 4.22–5.81)
RDW: 15.1 % (ref 11.5–15.5)
WBC: 5.8 10*3/uL (ref 4.0–10.5)

## 2014-07-23 LAB — GLUCOSE, CAPILLARY
GLUCOSE-CAPILLARY: 115 mg/dL — AB (ref 65–99)
Glucose-Capillary: 90 mg/dL (ref 65–99)
Glucose-Capillary: 102 mg/dL — ABNORMAL HIGH (ref 65–99)
Glucose-Capillary: 113 mg/dL — ABNORMAL HIGH (ref 65–99)

## 2014-07-23 LAB — URINE CULTURE: Culture: NO GROWTH

## 2014-07-23 MED ORDER — FUROSEMIDE 10 MG/ML IJ SOLN
40.0000 mg | Freq: Once | INTRAMUSCULAR | Status: AC
  Administered 2014-07-23: 40 mg via INTRAVENOUS
  Filled 2014-07-23: qty 4

## 2014-07-23 MED ORDER — COLLAGENASE 250 UNIT/GM EX OINT
TOPICAL_OINTMENT | Freq: Every day | CUTANEOUS | Status: DC
  Administered 2014-07-23 – 2014-07-24 (×2): via TOPICAL
  Filled 2014-07-23: qty 30

## 2014-07-23 MED ORDER — DILTIAZEM HCL ER COATED BEADS 300 MG PO CP24
300.0000 mg | ORAL_CAPSULE | Freq: Every day | ORAL | Status: DC
  Administered 2014-07-23 – 2014-07-24 (×2): 300 mg via ORAL
  Filled 2014-07-23 (×2): qty 1

## 2014-07-23 NOTE — Progress Notes (Addendum)
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 He was on long-term antibiotics. He refused removal of the prosthetic joint. His hospital stay was also complicated by confusion and MRI revealed subdural hematoma and small stroke. Subsequently, developed atrial fibrillation. He was started on Cardizem and anticoagulation. He was discharged to a skilled nursing facility. He went home from the skilled nursing facility 3 days ago. He came in complaining of weakness and pain in his left first toe. He was found to be bradycardic and was brought into the hospital for further management.  Past medical history:  Past Medical History  Diagnosis Date  . Hypertension   . Arthritis   . Stenosis of right carotid artery     40-59 percent   . Sepsis 05/04/2014  . 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 New onset atrial fibrillation 05/04/2014  . Diabetes mellitus type 2, controlled 05/04/2014  . Essential hypertension 05/04/2014  . Gout 05/04/2014  . Psoas abscess, right   . Alcoholism   . Streptococcal bacteremia 05/09/2014  . Acute encephalopathy 05/09/2014  . Cerebral infarction due to embolism of left middle cerebral artery   . SDH (subdural hematoma) 05/12/2014  . Septic arthritis of hip 05/18/2014    Consultants: None  Procedures: None  Antibiotics: Amoxicillin being continued  Subjective: Patient feels well this morning. Some mention of shortness of breath. No cough, no wheezing. No nausea or vomiting. He wants to go home.   Objective: Vital Signs  Filed Vitals:   07/22/14 1242 07/22/14 2204 07/23/14 0152 07/23/14 0519  BP: 105/77 117/64 116/61 150/62  Pulse: 62 75 81 88  Temp: 99.3 F (37.4 C) 98.3 F (36.8 C) 98.1 F (36.7 C)  98.4 F (36.9 C)  TempSrc: Oral Oral Oral Oral  Resp: 20 18 19 21   Height: 5\' 8"  (1.727 m)     Weight:    94.7 kg (208 lb 12.4 oz)  SpO2: 93% 97% 93% 98%    Intake/Output Summary (Last 24 hours) at 07/23/14 0816 Last data filed at 07/23/14 0600  Gross per 24 hour  Intake    943 ml  Output   2900 ml  Net  -1957 ml   Filed Weights   07/21/14 1743 07/21/14 2040 07/23/14 0519  Weight: 95.709 kg (211 lb) 97.5 kg (214 lb 15.2 oz) 94.7 kg (208 lb 12.4 oz)    General appearance: alert, cooperative, appears stated age and no distress Resp: Crackles noted bilateral bases without any wheezing.  Cardio: S1 S2 is irregularly irregular. No S3, S4. No rubs, murmurs, or bruit. No pedal edema. GI: soft, non-tender; bowel sounds normal; no masses,  no organomegaly No neurological deficits.  Telemetry reviewed. Heart rate has been in the 80s to 90s with occasional PVCs. Possible episode of nonsustained VT, although could be an artifact.   Lab Results:  Basic Metabolic Panel:  Recent Labs Lab 07/21/14 1515 07/22/14 0234 07/23/14 0443  NA 129* 130* 139  K 5.0 4.2 3.7  CL 98* 101 108  CO2 23 25 22   GLUCOSE 132* 140* 94  BUN 23* 23* 13  CREATININE 1.42* 1.00 0.53*  CALCIUM 9.7 8.8* 9.3  MG  --  1.9  --   PHOS  --  4.2  --  Liver Function Tests:  Recent Labs Lab 07/22/14 0234  AST 22  ALT 12*  ALKPHOS 61  BILITOT 0.5  PROT 6.1*  ALBUMIN 3.0*   CBC:  Recent Labs Lab 07/21/14 1515 07/22/14 0234 07/23/14 0443  WBC 8.2 7.1 5.8  NEUTROABS 4.3  --   --   HGB 11.4* 10.2* 10.5*  HCT 34.1* 30.3* 31.6*  MCV 93.7 93.5 92.7  PLT 300 224 222   Cardiac Enzymes:  Recent Labs Lab 07/21/14 2144 07/22/14 0234 07/22/14 0957  TROPONINI <0.03 <0.03 <0.03   BNP (last 3 results)  Recent Labs  07/21/14 1505  BNP 676.0*    CBG:  Recent Labs Lab 07/22/14 1226 07/22/14 1603 07/22/14 2159 07/22/14 2347 07/23/14 0610  GLUCAP 144* 108* 81 118* 90    Recent Results  (from the past 240 hour(s))  Culture, blood (routine x 2)     Status: None (Preliminary result)   Collection Time: 07/21/14  3:15 PM  Result Value Ref Range Status   Specimen Description BLOOD RIGHT ANTECUBITAL  Final   Special Requests BOTTLES DRAWN AEROBIC AND ANAEROBIC 5CC  Final   Culture NO GROWTH < 24 HOURS  Final   Report Status PENDING  Incomplete  Urine culture     Status: None (Preliminary result)   Collection Time: 07/21/14  4:35 PM  Result Value Ref Range Status   Specimen Description URINE, RANDOM  Final   Special Requests NONE  Final   Culture NO GROWTH < 24 HOURS  Final   Report Status PENDING  Incomplete  Culture, blood (routine x 2)     Status: None (Preliminary result)   Collection Time: 07/21/14  5:56 PM  Result Value Ref Range Status   Specimen Description BLOOD LEFT HAND  Final   Special Requests BOTTLES DRAWN AEROBIC AND ANAEROBIC 4CC  Final   Culture NO GROWTH < 24 HOURS  Final   Report Status PENDING  Incomplete  MRSA PCR Screening     Status: None   Collection Time: 07/21/14  8:54 PM  Result Value Ref Range Status   MRSA by PCR NEGATIVE NEGATIVE Final    Comment:        The GeneXpert MRSA Assay (FDA approved for NASAL specimens only), is one component of a comprehensive MRSA colonization surveillance program. It is not intended to diagnose MRSA infection nor to guide or monitor treatment for MRSA infections.       Studies/Results: Dg Chest 2 View  07/22/2014   CLINICAL DATA:  Dyspnea since yesterday. History of diabetes, hypertension, smoking.  EXAM: CHEST  2 VIEW  COMPARISON:  07/21/2014  FINDINGS: Heart is enlarged. There are mild interstitial changes of pulmonary edema. More focal opacity in the medial left lower lobe favored to represent infectious infiltrate. There are bilateral pleural effusions, only seen on the lateral view.  IMPRESSION: 1. Cardiomegaly and mild pulmonary edema. 2. Left lower lobe infiltrate or atelectasis. 3. Bilateral  pleural effusions.   Electronically Signed   By: Nolon Nations M.D.   On: 07/22/2014 11:51   Dg Chest 2 View  07/21/2014   CLINICAL DATA:  Weakness for 3 days  EXAM: CHEST  2 VIEW  COMPARISON:  05/18/2014  FINDINGS: The heart size is moderately enlarged. There is aortic atherosclerosis noted. Coarsened interstitial markings are noted bilaterally. Small bilateral pleural effusions are identified. Prior bilateral shoulder arthroplasty.  IMPRESSION: Mild CHF.   Electronically Signed   By: Kerby Moors M.D.   On: 07/21/2014 16:20  Dg Abd 1 View  07/21/2014   CLINICAL DATA:  Abdominal distension  EXAM: ABDOMEN - 1 VIEW  COMPARISON:  None.  FINDINGS: Scattered large and small bowel gas is noted. Postsurgical changes are noted in the right hip. Degenerative changes of the lumbar spine are seen. Diffuse aortic calcifications are noted. No free air is seen. Fecal material is noted throughout the colon.  IMPRESSION: Nonspecific abdomen.   Electronically Signed   By: Inez Catalina M.D.   On: 07/21/2014 22:03   Ct Head Wo Contrast  07/21/2014   CLINICAL DATA:  79 year old male with recent subdural hematoma.  EXAM: CT HEAD WITHOUT CONTRAST  TECHNIQUE: Contiguous axial images were obtained from the base of the skull through the vertex without intravenous contrast.  COMPARISON:  Head CT dated 05/11/2014  FINDINGS: There is slight prominence of the ventricles and sulci compatible with age-related volume loss. Periventricular and deep white matter hypodensities represent chronic microvascular ischemic changes. There is no intracranial hemorrhage. No mass effect or midline shift identified.  The visualized paranasal sinuses and mastoid air cells are well aerated. The calvarium is intact.  IMPRESSION: No acute intracranial pathology.  No hemorrhage.  Age-related atrophy and chronic microvascular ischemic disease.   Electronically Signed   By: Anner Crete M.D.   On: 07/21/2014 21:00    Medications:  Scheduled: .  amoxicillin  500 mg Oral TID  . diltiazem  300 mg Oral Daily  . docusate sodium  100 mg Oral BID  . folic acid  1 mg Oral Daily  . furosemide  40 mg Intravenous Once  . furosemide  40 mg Intravenous Once  . insulin aspart  0-15 Units Subcutaneous TID WC  . insulin aspart  0-5 Units Subcutaneous QHS  . polyethylene glycol  17 g Oral Daily  . pregabalin  150 mg Oral QHS  . pregabalin  75 mg Oral Daily  . rivaroxaban  20 mg Oral QPC breakfast  . rosuvastatin  10 mg Oral Q48H  . saccharomyces boulardii  250 mg Oral BID  . sodium chloride  3 mL Intravenous Q12H  . thiamine  100 mg Oral Daily   Continuous:  VEL:[id number] **OR** acetaminophen, albuterol, bisacodyl, HYDROcodone-acetaminophen, ondansetron **OR** ondansetron (ZOFRAN) IV, traMADol  Assessment/Plan:  Active Problems:   Diabetes mellitus type 2, controlled   Essential hypertension   Septic arthritis of hip   Hyponatremia   Acute renal failure   Transient hypotension   Bradycardia   Pressure ulcer    Acute respiratory failure with Mild hypoxia secondary to acute diastolic CHF Initial chest x-ray was read as CHF, but at that time. Patient did not have any crackles in his lungs. Chest x-ray was repeated yesterday which also showed persistent findings of congestive heart failure. Possibility of pneumonia was raised, but patient does not have any symptoms suggestive of pneumonia. Small bilateral pleural effusions were noted. In view of these findings, patient will be started on Lasix. Ins and outs will be monitored closely. Hopefully we'll be able to be weaned him off of oxygen after he gets diuretics.  Fatigue Likely secondary to bradycardia. Improved. PT and OT input is pending. No focal neurological deficits. No evidence for active infection.  Bradycardia This was secondary to the patient taking both Cardizem and beta blocker when the beta blocker had been discontinued. Patient understands that this was a mistake. Heart  rate remains in the 80s and stable. Resume his long-acting Cardizem as he has tolerated the short-acting Cardizem. No beta blockers.  Hyponatremia Sodium levels have improved  Acute renal failure Creatinine is improved. Monitor urine output  Transient hypotension Likely secondary to use of both Cardizem and beta blocker. Blood pressure has stabilized.   Essential hypertension Blood pressure has stabilized overnight. He is back on long-acting Cardizem, mainly for heart rate control as discussed above.  Septic arthritis of hip Continue home dose of amoxicillin  History of atrial fibrillation Resume short-acting Cardizem as discussed earlier. Anticoagulation has been resumed as well.  History of CVA As well as small subdural hematoma seen on MRI on May 6. Repeat CT scan did not suggest any evidence for intracranial bleeding. Anticoagulation was resumed.  History of diabetes mellitus type 2 Continue sliding scale coverage.  DVT Prophylaxis: On full anticoagulation    Code Status: Full code  Family Communication: Discussed with the patient  Disposition Plan: Await PT and OT evaluation. Lasix to be given today. Possible discharge tomorrow.   Follow-up Appointment?: with PCP in 1 week.   LOS: 2 days   [facility] Pager [phone] 07/23/2014, 8:16 AM  If 7PM-7AM, please contact night-coverage at [email or web address], password [facility]

## 2014-07-23 NOTE — Care Management Important Message (Signed)
Important Message  Patient Details  Name: [name] MRN: [id number] Date of Birth: [birth year]   Medicare Important Message Given:  Yes-second notification given    Pricilla Handler 07/23/2014, 3:01 PM

## 2014-07-23 NOTE — Consult Note (Signed)
WOC wound consult note Reason for Consult: Consult requested for bilat heels.  Pt states he has been followed by an ortho physician and home health for these sites prior to admission and has been using Santyl.  He is very well-informed regarding topical treatment and plan of care. Wound type: Left heel unstageable pressure injury; 4X2.5cm with 100% loose eschar, edges beginning to lift slightly.  No odor, small amt yellow drainage. Right heel with unstageable pressure injury; 3X2.5cm, 80% red and moist, 20% yellow slough.  Small amt yellow drainage, no odor. Pressure Ulcer POA: Yes Dressing procedure/placement/frequency: Continue present plan of care with Santyl to chemically debride nonviable tissue.  Pt can follow-up with ortho after discharge for sharp debridement when appropriate.  Float heels to reduce pressure.  Discussed plan of care with patient and he verbalized understanding. Please re-consult if further assistance is needed.  Thank-you,  Julien Girt MSN, [place], [place], [place], [place]

## 2014-07-23 NOTE — Evaluation (Signed)
Physical Therapy Evaluation Patient Details Name: [name] MRN: [id number] DOB: [birth year] Today's Date: 07/23/2014   History of Present Illness  79 y/o WM admitted with hyponatremia and bradycardia with pt reporting he accidentally took too much medication. Pt recently d/c from SNF rehab 3 days prior to admission.  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 Pt currently with functional limitations due to the deficits listed below (see PT Problem List).  Pt will benefit from skilled PT to increase their independence and safety with mobility to allow discharge to the venue listed below.  Pt has all DME and states he has 24 hour care at home with children and grandchildren. Pt will need HHPT and could benefit from new diabetic shoes as his sneakers are beginning to fall apart.     Follow Up Recommendations Home health PT    Equipment Recommendations  Other (comment) (Could benefit from referral for diabetic shoes)    Recommendations for Other Services       Precautions / Restrictions        Mobility  Bed Mobility               General bed mobility comments: Pt up in personal w/c upon arrival brushing teeth at sink  Transfers Overall transfer level: Needs assistance Equipment used: Rolling walker (2 wheeled) Transfers: Sit to/from Stand Sit to Stand: Min guard         General transfer comment: good safety with checking of brakes and good use of UE  Ambulation/Gait Ambulation/Gait assistance: Min guard Ambulation Distance (Feet): 60 Feet (x2) Assistive device: Rolling walker (2 wheeled) Gait Pattern/deviations: Step-through pattern     General Gait Details: Pt ambulated with stpe through pattern and then took sitting rest break. On return gait, pt reporting that R leg felt weaker with increased fatigue, but no buckeling.  o2 99% on room air after  gait.  Stairs            Wheelchair Mobility    Modified Rankin (Stroke Patients Only)       Balance Overall balance assessment: Needs assistance           Standing balance-Leahy Scale: Poor Standing balance comment: requires RW                             Pertinent Vitals/Pain Pain Assessment: No/denies pain    Home Living Family/patient expects to be discharged to:: Private residence Living Arrangements: Children Available Help at Discharge: Family;Available 24 hours/day Type of Home: House Home Access: Elevator     Home Layout: Multi-level Home Equipment: Walker - 2 wheels;Cane - single point;Wheelchair - manual      Prior Function Level of Independence: Needs assistance   Gait / Transfers Assistance Needed: Had just returned home from SNF rehab.  He reports at rehab he was walking about 125' with RW with SBA/S.  Had only 1 HHPT session before re-admission.  Had been primarily using w/c for mobility.           Hand Dominance   Dominant Hand: Right    Extremity/Trunk Assessment   Upper Extremity Assessment: Defer to OT evaluation           Lower Extremity Assessment: Generalized weakness         Communication   Communication: No difficulties  Cognition Arousal/Alertness: Awake/alert Behavior  During Therapy: WFL for tasks assessed/performed Overall Cognitive Status: Within Functional Limits for tasks assessed                      General Comments General comments (skin integrity, edema, etc.): Pt ambulated in shoes and noted that they will need to replaced soon. Pt states he really likes these shoes and only wants ones like this with a wider base.  Discussed possibility of diabeteic shoes and that they have many new options. Pt to think about it.    Exercises        Assessment/Plan    PT Assessment Patient needs continued PT services  PT Diagnosis Difficulty walking;Generalized weakness   PT Problem List  Decreased activity tolerance;Decreased balance;Decreased mobility  PT Treatment Interventions Gait training;DME instruction;Functional mobility training;Therapeutic activities;Therapeutic exercise   PT Goals (Current goals can be found in the Care Plan section) Acute Rehab PT Goals Patient Stated Goal: To go home PT Goal Formulation: With patient Time For Goal Achievement: 07/30/14 Potential to Achieve Goals: Good    Frequency Min 3X/week   Barriers to discharge        Co-evaluation               End of Session Equipment Utilized During Treatment: Gait belt Activity Tolerance: Patient tolerated treatment well Patient left: in chair;with nursing/sitter in room;with call bell/phone within reach Nurse Communication: Mobility status (NT)         Time: 1610-9604 PT Time Calculation (min) (ACUTE ONLY): 28 min   Charges:   PT Evaluation $Initial PT Evaluation Tier I: 1 Procedure PT Treatments $Gait Training: 8-22 mins   PT G Codes:        Guillermina Shaft LUBECK 07/23/2014, 10:42 AM

## 2014-07-23 NOTE — Progress Notes (Signed)
Occupational Therapy Evaluation Patient Details Name: CHRISTAN DEFRANCO MRN: [id number] DOB: 24-Sep-1935 Today's Date: 07/23/2014    History of Present Illness 79 y/o WM admitted with hyponatremia and bradycardia with pt reporting he accidentally took too much medication. Pt recently d/c from SNF rehab 3 days prior to admission.  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 Pt close to baseline level PTA, however, will benefit from [facility] to return to PLOF.     Follow Up Recommendations  Home health OT;Supervision - Intermittent    Equipment Recommendations  None recommended by OT    Recommendations for Other Services       Precautions / Restrictions Precautions Precautions: Fall      Mobility Bed Mobility               General bed mobility comments: up in w/c. States he has an adjustable bed at home that he uses  Transfers Overall transfer level: Needs assistance Equipment used: Rolling walker (2 wheeled) Transfers: Sit to/from Stand Sit to Stand: Supervision         General transfer comment: good safety with checking of brakes and good use of UE    Balance Overall balance assessment: Needs assistance   Sitting balance-Leahy Scale: Good       Standing balance-Leahy Scale: Poor Standing balance comment: requires RW                            ADL Overall ADL's : Needs assistance/impaired                                     Functional mobility during ADLs: Supervision/safety;Wheelchair General ADL Comments: Overall set up to S for ADL. Uses AE for LB ADL.      Vision     Perception     Praxis      Pertinent Vitals/Pain Pain Assessment: No/denies pain     Hand Dominance Right   Extremity/Trunk Assessment Upper Extremity Assessment Upper Extremity Assessment:  Generalized weakness (hx B shoulder replacements)   Lower Extremity Assessment Lower Extremity Assessment: Defer to PT evaluation   Cervical / Trunk Assessment Cervical / Trunk Assessment: Normal   Communication Communication Communication: No difficulties   Cognition Arousal/Alertness: Awake/alert Behavior During Therapy: WFL for tasks assessed/performed Overall Cognitive Status: Within Functional Limits for tasks assessed                     General Comments       Exercises       Shoulder Instructions      Home Living Family/patient expects to be discharged to:: Private residence Living Arrangements: Children Available Help at Discharge: Family;Available 24 hours/day Type of Home: House Home Access: Elevator     Home Layout: Multi-level     Bathroom Shower/Tub: Hospital doctor Toilet: Handicapped height Bathroom Accessibility: Yes How Accessible: Accessible via wheelchair Home Equipment: [place] - 2 wheels;Cane - single point;Wheelchair - Brewing technologist - built in;Hand held shower head;Grab bars - tub/shower   Additional Comments: lives with daughter on his own floor      Prior Functioning/Environment Level of Independence: Needs assistance  Gait / Transfers Assistance  Needed: Had just returned home from SNF rehab.  He reports at rehab he was walking about 125' with RW with SBA/S.  Had only 1 HHPT session before re-admission.  Had been primarily using w/c for mobility. ADL's / Homemaking Assistance Needed: set up        OT Diagnosis: Generalized weakness   OT Problem List: Decreased strength;Decreased activity tolerance;Impaired balance (sitting and/or standing);Decreased knowledge of use of DME or AE   OT Treatment/Interventions:      OT Goals(Current goals can be found in the care plan section) Acute Rehab OT Goals Patient Stated Goal: to be independent again OT Goal Formulation: All assessment and education complete, DC therapy   OT Frequency:     Barriers to D/C:            Co-evaluation              End of Session Nurse Communication: Mobility status  Activity Tolerance: Patient tolerated treatment well Patient left: Other (comment);with call bell/phone within reach (in w/c)   Time: 6606-0045 OT Time Calculation (min): 13 min Charges:  OT General Charges $OT Visit: 1 Procedure OT Evaluation $Initial OT Evaluation Tier I: 1 Procedure G-Codes:    Messiah Rovira,HILLARY 2014/08/12, 1:37 PM   [facility], OTR/L  [phone] August 12, 2014

## 2014-07-24 LAB — CBC
HEMATOCRIT: 33.4 % — AB (ref 39.0–52.0)
Hemoglobin: 11.1 g/dL — ABNORMAL LOW (ref 13.0–17.0)
MCH: 30.7 pg (ref 26.0–34.0)
MCHC: 33.2 g/dL (ref 30.0–36.0)
MCV: 92.3 fL (ref 78.0–100.0)
PLATELETS: 240 10*3/uL (ref 150–400)
RBC: 3.62 MIL/uL — AB (ref 4.22–5.81)
RDW: 14.9 % (ref 11.5–15.5)
WBC: 6.4 10*3/uL (ref 4.0–10.5)

## 2014-07-24 LAB — GLUCOSE, CAPILLARY
Glucose-Capillary: 90 mg/dL (ref 65–99)
Glucose-Capillary: 110 mg/dL — ABNORMAL HIGH (ref 65–99)

## 2014-07-24 LAB — BASIC METABOLIC PANEL
Anion gap: 9 (ref 5–15)
BUN: 12 mg/dL (ref 6–20)
CHLORIDE: 105 mmol/L (ref 101–111)
CO2: 23 mmol/L (ref 22–32)
CREATININE: 0.59 mg/dL — AB (ref 0.61–1.24)
Calcium: 9.5 mg/dL (ref 8.9–10.3)
Glucose, Bld: 84 mg/dL (ref 65–99)
POTASSIUM: 3.5 mmol/L (ref 3.5–5.1)
Sodium: 137 mmol/L (ref 135–145)

## 2014-07-24 MED ORDER — POTASSIUM CHLORIDE CRYS ER 20 MEQ PO TBCR
20.0000 meq | EXTENDED_RELEASE_TABLET | Freq: Every day | ORAL | Status: DC
  Administered 2014-07-24: 20 meq via ORAL
  Filled 2014-07-24: qty 1

## 2014-07-24 MED ORDER — FUROSEMIDE 40 MG PO TABS
40.0000 mg | ORAL_TABLET | Freq: Every day | ORAL | Status: DC
  Administered 2014-07-24: 40 mg via ORAL
  Filled 2014-07-24: qty 1

## 2014-07-24 MED ORDER — COLLAGENASE 250 UNIT/GM EX OINT: TOPICAL_OINTMENT | Freq: Every day | CUTANEOUS | Status: DC

## 2014-07-24 MED ORDER — POTASSIUM CHLORIDE CRYS ER 20 MEQ PO TBCR: 20.0000 meq | EXTENDED_RELEASE_TABLET | Freq: Every day | ORAL | Status: DC

## 2014-07-24 MED ORDER — FUROSEMIDE 40 MG PO TABS: 40.0000 mg | ORAL_TABLET | Freq: Every day | ORAL | Status: DC

## 2014-07-24 NOTE — Progress Notes (Signed)
NURSING PROGRESS NOTE  [name] [id number] Discharge Data: 07/24/2014 4:49 PM Attending Provider: No att. providers found JOI:NOMVEHM,CNOBS, MD     [name] to be D/C'd Home per MD order.  Discussed with the patient the After Visit Summary and all questions fully answered. All IV's discontinued with no bleeding noted. All belongings returned to patient for patient to take home.   Last Vital Signs:  Blood pressure 133/64, pulse 84, temperature 97.9 F (36.6 C), temperature source Axillary, resp. rate 19, height 5\' 8"  (1.727 m), weight 89.7 kg (197 lb 12 oz), SpO2 99 %.  Discharge Medication List   Medication List    STOP taking these medications        atenolol 50 MG tablet  Commonly known as:  TENORMIN     cloNIDine 0.2 MG tablet  Commonly known as:  CATAPRES      TAKE these medications        ALPHA LIPOIC ACID PO  Take 1 capsule by mouth 2 (two) times daily. For gout     amoxicillin 500 MG tablet  Commonly known as:  AMOXIL  Take 1 tablet (500 mg total) by mouth 3 (three) times daily.     celecoxib 200 MG capsule  Commonly known as:  CELEBREX  Take 200 mg by mouth 2 (two) times daily.     collagenase ointment  Commonly known as:  SANTYL  Apply topically daily.     CRESTOR 10 MG tablet  Generic drug:  rosuvastatin  Take 10 mg by mouth daily. Takes one every other day     D3-1000 1000 UNITS capsule  Generic drug:  Cholecalciferol  Take 1,000 Units by mouth daily.     diltiazem 360 MG 24 hr capsule  Commonly known as:  CARDIZEM CD  Take 1 capsule (360 mg total) by mouth daily.     feeding supplement (ENSURE ENLIVE) Liqd  Take 237 mLs by mouth 2 (two) times daily between meals.     feeding supplement (PRO-STAT SUGAR FREE 64) Liqd  Place 30 mLs into feeding tube 2 (two) times daily.     folic acid 1 MG tablet  Commonly known as:  FOLVITE  Take 1 tablet (1 mg total) by mouth daily.     furosemide 40 MG tablet  Commonly known as:  LASIX  Take 1  tablet (40 mg total) by mouth daily.     hydrALAZINE 25 MG tablet  Commonly known as:  APRESOLINE  Take 1 tablet (25 mg total) by mouth every 8 (eight) hours.     LYRICA 75 MG capsule  Generic drug:  pregabalin  Take 75-150 mg by mouth 2 (two) times daily. 75mg  in the morning and 150mg  at night     metFORMIN 1000 MG tablet  Commonly known as:  GLUCOPHAGE  Take 1,000 mg by mouth daily with breakfast.     multivitamin with minerals Tabs tablet  Take 1 tablet by mouth daily.     polyethylene glycol packet  Commonly known as:  MIRALAX / GLYCOLAX  Take 17 g by mouth daily.     potassium chloride SA 20 MEQ tablet  Commonly known as:  K-DUR,KLOR-CON  Take 1 tablet (20 mEq total) by mouth daily.     saccharomyces boulardii 250 MG capsule  Commonly known as:  FLORASTOR  Take 1 capsule (250 mg total) by mouth 2 (two) times daily.     thiamine 100 MG tablet  Take 1 tablet (100 mg total)  by mouth daily.     traMADol 50 MG tablet  Commonly known as:  ULTRAM  Take 2 tablets (100 mg total) by mouth every 6 (six) hours as needed. Every 4 hours     ULORIC 80 MG Tabs  Generic drug:  Febuxostat  Take 1 tablet by mouth daily.     vitamin C 500 MG tablet  Commonly known as:  ASCORBIC ACID  Take 500 mg by mouth every other day.     XARELTO 20 MG Tabs tablet  Generic drug:  rivaroxaban  Take 20 mg by mouth every morning.

## 2014-07-24 NOTE — Care Management Note (Signed)
Case Management Note  Patient Details  Name: [name] MRN: [id number] Date of Birth: 10/12/1935  Subjective/Objective:       Admitted with hyponatremia             Action/Plan: Talked to patient about DCP; patient is active with [facility] as prior to admission. Karolee Stamps with [facility] notified of admission and patient is to be discharged home today. No DME needed,   Expected Discharge Date:      07/24/2014            Expected Discharge Plan:  [place]  In-House Referral:     Discharge planning Services  CM Consult Choice offered to:  Patient  HH Arranged:  RN, PT, OT [facility] Agency:  [place]  Status of Service:  In process, will continue to follow  Medicare Important Message Given:  [facility] notification given   Sherrilyn Rist [phone] 07/24/2014, 10:16 AM

## 2014-07-24 NOTE — Discharge Summary (Signed)
[facility]  Physician Discharge Summary   Patient ID: [name] MRN: [id number] DOB/AGE: May 12, 1935 79 y.o.  Admit date: 07/21/2014 Discharge date: 07/24/2014  PCP: Lujean Amel, MD  DISCHARGE DIAGNOSES:  Active Problems:   Diabetes mellitus type 2, controlled   Essential hypertension   Septic arthritis of hip   Hyponatremia   Acute renal failure   Transient hypotension   Bradycardia   Pressure ulcer   RECOMMENDATIONS FOR OUTPATIENT FOLLOW UP: 1. Patient has been started on Lasix and potassium. Please assess volume status at follow-up. He will also need blood work to check renal function and electrolytes. 2. 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 He was on long-term antibiotics. He refused removal of the prosthetic joint. His hospital stay was also complicated by confusion and MRI revealed subdural hematoma and small stroke. Subsequently, developed atrial fibrillation. He was started on Cardizem and anticoagulation. He was discharged to a skilled nursing facility. He went home from the skilled nursing facility 3 days ago. He came in complaining of weakness and pain in his left first toe. He was found to be bradycardic and was brought into the hospital for further management.   HOSPITAL COURSE:   Acute respiratory failure with Mild hypoxia secondary to acute diastolic CHF Initial chest x-ray was read as CHF, but at that time patient did not have any crackles in his lungs. Chest x-ray was repeated which also showed persistent findings of congestive heart  failure and now with pleural effusions. Possibility of pneumonia was raised, but patient does not have any symptoms suggestive of pneumonia. Patient was given 2 doses of IV Lasix. He diuresed more than 3 L. He is symptomatically much improved. His oxygen saturation normal on room air. He was initially requiring oxygen. He will be discharged home with oral Lasix. He will be asked to follow up with his primary care physician.  Fatigue Likely secondary to bradycardia. He did not have any focal neurological deficits. His weakness improved as his heart rate improved. He was seen by physical and occupational therapy. Home health has been ordered.   Bradycardia This was secondary to the patient taking both Cardizem and beta blocker when the beta blocker had been discontinued. Patient understands that this was a mistake. Trait has improved. He was started back on Cardizem. He is tolerating it well.   Hyponatremia Sodium levels have improved. Will need outpatient monitoring of same.  Acute renal failure Creatinine is improved. Will need outpatient monitoring of same.  Transient hypotension Likely secondary to use of both Cardizem and beta blocker. Blood pressure has stabilized.  Essential hypertension He is back on long-acting Cardizem, mainly for heart rate control as discussed above. Blood pressure is somewhat elevated. He may also resume his hydralazine. It is unclear if he was indeed on clonidine or not. But for now we will not recommend continuing it.  History of Septic arthritis of hip Continue home dose of amoxicillin  History of atrial fibrillation Heart rate is now stable. When he was discharged to skilled nursing facility in May he was taken off  of his anticoagulation. But when he was discharged from the skilled nursing facility to home it was noted that he was resumed on his oral anticoagulation. After discussions with neurology and a repeat CT Head showing no evidence for bleed, patient  was started back on Xarelto.  History of CVA As well as small subdural hematoma seen on MRI on May 6. Repeat CT scan did not suggest any evidence for intracranial bleeding. Anticoagulation was resumed after discussions with neurology.  History of diabetes mellitus type 2 Resume home medications  Patient is very keen on going home today. He has significantly improved. He is okay for discharge with home health.  PERTINENT LABS:  The results of significant diagnostics from this hospitalization (including imaging, microbiology, ancillary and laboratory) are listed below for reference.    Microbiology: Recent Results (from the past 240 hour(s))  Culture, blood (routine x 2)     Status: None (Preliminary result)   Collection Time: 07/21/14  3:15 PM  Result Value Ref Range Status   Specimen Description BLOOD RIGHT ANTECUBITAL  Final   Special Requests BOTTLES DRAWN AEROBIC AND ANAEROBIC 5CC  Final   Culture NO GROWTH 2 DAYS  Final   Report Status PENDING  Incomplete  Urine culture     Status: None   Collection Time: 07/21/14  4:35 PM  Result Value Ref Range Status   Specimen Description URINE, RANDOM  Final   Special Requests NONE  Final   Culture NO GROWTH 2 DAYS  Final   Report Status 07/23/2014 FINAL  Final  Culture, blood (routine x 2)     Status: None (Preliminary result)   Collection Time: 07/21/14  5:56 PM  Result Value Ref Range Status   Specimen Description BLOOD LEFT HAND  Final   Special Requests BOTTLES DRAWN AEROBIC AND ANAEROBIC 4CC  Final   Culture NO GROWTH 2 DAYS  Final   Report Status PENDING  Incomplete  MRSA PCR Screening     Status: None   Collection Time: 07/21/14  8:54 PM  Result Value Ref Range Status   MRSA by PCR NEGATIVE NEGATIVE Final    Comment:        The GeneXpert MRSA Assay (FDA approved for NASAL specimens only), is one component of a comprehensive MRSA colonization surveillance program. It is not intended to diagnose MRSA infection nor to  guide or monitor treatment for MRSA infections.      Labs: Basic Metabolic Panel:  Recent Labs Lab 07/21/14 1515 07/22/14 0234 07/23/14 0443 07/24/14 0323  NA 129* 130* 139 137  K 5.0 4.2 3.7 3.5  CL 98* 101 108 105  CO2 23 25 22 23   GLUCOSE 132* 140* 94 84  BUN 23* 23* 13 12  CREATININE 1.42* 1.00 0.53* 0.59*  CALCIUM 9.7 8.8* 9.3 9.5  MG  --  1.9  --   --   PHOS  --  4.2  --   --    Liver Function Tests:  Recent Labs Lab 07/22/14 0234  AST 22  ALT 12*  ALKPHOS 61  BILITOT 0.5  PROT 6.1*  ALBUMIN 3.0*   CBC:  Recent Labs Lab 07/21/14 1515 07/22/14 0234 07/23/14 0443 07/24/14 0323  WBC 8.2 7.1 5.8 6.4  NEUTROABS 4.3  --   --   --   HGB 11.4* 10.2* 10.5* 11.1*  HCT 34.1* 30.3* 31.6* 33.4*  MCV 93.7 93.5 92.7 92.3  PLT 300 224 222 240   Cardiac Enzymes:  Recent Labs Lab 07/21/14  2144 07/22/14 0234 07/22/14 0957  TROPONINI <0.03 <0.03 <0.03   BNP: BNP (last 3 results)  Recent Labs  07/21/14 1505  BNP 676.0*    CBG:  Recent Labs Lab 07/23/14 1115 07/23/14 1622 07/23/14 2105 07/24/14 0636 07/24/14 1116  GLUCAP 115* 102* 113* 90 110*     IMAGING STUDIES Dg Chest 2 View  07/22/2014   CLINICAL DATA:  Dyspnea since yesterday. History of diabetes, hypertension, smoking.  EXAM: CHEST  2 VIEW  COMPARISON:  07/21/2014  FINDINGS: Heart is enlarged. There are mild interstitial changes of pulmonary edema. More focal opacity in the medial left lower lobe favored to represent infectious infiltrate. There are bilateral pleural effusions, only seen on the lateral view.  IMPRESSION: 1. Cardiomegaly and mild pulmonary edema. 2. Left lower lobe infiltrate or atelectasis. 3. Bilateral pleural effusions.   Electronically Signed   By: Nolon Nations M.D.   On: 07/22/2014 11:51   Dg Chest 2 View  07/21/2014   CLINICAL DATA:  Weakness for 3 days  EXAM: CHEST  2 VIEW  COMPARISON:  05/18/2014  FINDINGS: The heart size is moderately enlarged. There is  aortic atherosclerosis noted. Coarsened interstitial markings are noted bilaterally. Small bilateral pleural effusions are identified. Prior bilateral shoulder arthroplasty.  IMPRESSION: Mild CHF.   Electronically Signed   By: Kerby Moors M.D.   On: 07/21/2014 16:20   Dg Abd 1 View  07/21/2014   CLINICAL DATA:  Abdominal distension  EXAM: ABDOMEN - 1 VIEW  COMPARISON:  None.  FINDINGS: Scattered large and small bowel gas is noted. Postsurgical changes are noted in the right hip. Degenerative changes of the lumbar spine are seen. Diffuse aortic calcifications are noted. No free air is seen. Fecal material is noted throughout the colon.  IMPRESSION: Nonspecific abdomen.   Electronically Signed   By: Inez Catalina M.D.   On: 07/21/2014 22:03   Ct Head Wo Contrast  07/21/2014   CLINICAL DATA:  79 year old male with recent subdural hematoma.  EXAM: CT HEAD WITHOUT CONTRAST  TECHNIQUE: Contiguous axial images were obtained from the base of the skull through the vertex without intravenous contrast.  COMPARISON:  Head CT dated 05/11/2014  FINDINGS: There is slight prominence of the ventricles and sulci compatible with age-related volume loss. Periventricular and deep white matter hypodensities represent chronic microvascular ischemic changes. There is no intracranial hemorrhage. No mass effect or midline shift identified.  The visualized paranasal sinuses and mastoid air cells are well aerated. The calvarium is intact.  IMPRESSION: No acute intracranial pathology.  No hemorrhage.  Age-related atrophy and chronic microvascular ischemic disease.   Electronically Signed   By: Anner Crete M.D.   On: 07/21/2014 21:00    DISCHARGE EXAMINATION: Filed Vitals:   07/23/14 1414 07/23/14 2100 07/24/14 0622 07/24/14 1030  BP: 120/51 142/70 159/92 133/64  Pulse: 57 63 89 84  Temp: 98.2 F (36.8 C) 98.2 F (36.8 C) 98.9 F (37.2 C) 97.9 F (36.6 C)  TempSrc: Oral Oral Oral Axillary  Resp: 16 18 19 19   Height:       Weight:   89.7 kg (197 lb 12 oz)   SpO2: 99% 98% 96% 99%   General appearance: alert, cooperative, appears stated age and no distress Resp: clear to auscultation bilaterally Cardio: regular rate and rhythm, S1, S2 normal, no murmur, click, rub or gallop GI: soft, non-tender; bowel sounds normal; no masses,  no organomegaly  DISPOSITION: Home with home health  Discharge Instructions  Call MD for:  difficulty breathing, headache or visual disturbances    Complete by:  As directed      Call MD for:  extreme fatigue    Complete by:  As directed      Call MD for:  persistant dizziness or light-headedness    Complete by:  As directed      Call MD for:  persistant nausea and vomiting    Complete by:  As directed      Call MD for:  temperature >100.4    Complete by:  As directed      Diet Carb Modified    Complete by:  As directed      Discharge instructions    Complete by:  As directed   Please follow up with your PCP in 7 days. Please check weight's daily and call your doctor if you gain more than 2 lbs every 3 days as it could suggest fluid buildup. You will also need to follow up for your wounds.  You were cared for by a hospitalist during your hospital stay. If you have any questions about your discharge medications or the care you received while you were in the hospital after you are discharged, you can call the unit and asked to speak with the hospitalist on call if the hospitalist that took care of you is not available. Once you are discharged, your primary care physician will handle any further medical issues. Please note that NO REFILLS for any discharge medications will be authorized once you are discharged, as it is imperative that you return to your primary care physician (or establish a relationship with a primary care physician if you do not have one) for your aftercare needs so that they can reassess your need for medications and monitor your lab values. If you do not have a  primary care physician, you can call [phone] for a physician referral.     Increase activity slowly    Complete by:  As directed            ALLERGIES:  Allergies  Allergen Reactions  . Allopurinol Other (See Comments)    Makes head 'feel funny'  . Atorvastatin Other (See Comments)  . Motrin [Ibuprofen] Other (See Comments)    Blisters in groin area     Discharge Medication List as of 07/24/2014 11:23 AM    START taking these medications   Details  collagenase (SANTYL) ointment Apply topically daily., Starting 07/24/2014, Until Discontinued, Print    furosemide (LASIX) 40 MG tablet Take 1 tablet (40 mg total) by mouth daily., Starting 07/24/2014, Until Discontinued, Print    potassium chloride SA (K-DUR,KLOR-CON) 20 MEQ tablet Take 1 tablet (20 mEq total) by mouth daily., Starting 07/24/2014, Until Discontinued, Print      CONTINUE these medications which have NOT CHANGED   Details  ALPHA LIPOIC ACID PO Take 1 capsule by mouth 2 (two) times daily. For gout, Until Discontinued, Historical Med    amoxicillin (AMOXIL) 500 MG tablet Take 1 tablet (500 mg total) by mouth 3 (three) times daily., Starting 07/12/2014, Until Discontinued, No Print    celecoxib (CELEBREX) 200 MG capsule Take 200 mg by mouth 2 (two) times daily., Starting 02/26/2014, Until Discontinued, Historical Med    Cholecalciferol (D3-1000) 1000 UNITS capsule Take 1,000 Units by mouth daily., Until Discontinued, Historical Med    CRESTOR 10 MG tablet Take 10 mg by mouth daily. Takes one every other day, Starting 03/05/2014, Until Discontinued, Historical Med  diltiazem (CARDIZEM CD) 360 MG 24 hr capsule Take 1 capsule (360 mg total) by mouth daily., Starting 05/24/2014, Until Discontinued, No Print    folic acid (FOLVITE) 1 MG tablet Take 1 tablet (1 mg total) by mouth daily., Starting 05/24/2014, Until Discontinued, No Print    hydrALAZINE (APRESOLINE) 25 MG tablet Take 1 tablet (25 mg total) by mouth every 8  (eight) hours., Starting 05/24/2014, Until Discontinued, No Print    LYRICA 75 MG capsule Take 75-150 mg by mouth 2 (two) times daily. 75mg  in the morning and 150mg  at night, Starting 04/27/2014, Until Discontinued, Historical Med    metFORMIN (GLUCOPHAGE) 1000 MG tablet Take 1,000 mg by mouth daily with breakfast. , Starting 07/16/2014, Until Discontinued, Historical Med    Multiple Vitamin (MULTIVITAMIN WITH MINERALS) TABS tablet Take 1 tablet by mouth daily., Starting 05/24/2014, Until Discontinued, No Print    saccharomyces boulardii (FLORASTOR) 250 MG capsule Take 1 capsule (250 mg total) by mouth 2 (two) times daily., Starting 05/24/2014, Until Discontinued, No Print    thiamine 100 MG tablet Take 1 tablet (100 mg total) by mouth daily., Starting 05/24/2014, Until Discontinued, No Print    traMADol (ULTRAM) 50 MG tablet Take 2 tablets (100 mg total) by mouth every 6 (six) hours as needed. Every 4 hours, Starting 05/24/2014, Until Discontinued, Normal    ULORIC 80 MG TABS Take 1 tablet by mouth daily., Starting 02/22/2014, Until Discontinued, Historical Med    vitamin C (ASCORBIC ACID) 500 MG tablet Take 500 mg by mouth every other day., Until Discontinued, Historical Med    XARELTO 20 MG TABS tablet Take 20 mg by mouth every morning., Starting 07/16/2014, Until Discontinued, Historical Med    Amino Acids-Protein Hydrolys (FEEDING SUPPLEMENT, PRO-STAT SUGAR FREE 64,) LIQD Place 30 mLs into feeding tube 2 (two) times daily., Starting 05/24/2014, Until Discontinued, No Print    feeding supplement, ENSURE ENLIVE, (ENSURE ENLIVE) LIQD Take 237 mLs by mouth 2 (two) times daily between meals., Starting 05/24/2014, Until Discontinued, No Print    polyethylene glycol (MIRALAX / GLYCOLAX) packet Take 17 g by mouth daily., Starting 05/24/2014, Until Discontinued, No Print      STOP taking these medications     atenolol (TENORMIN) 50 MG tablet      cloNIDine (CATAPRES) 0.2 MG tablet        Follow-up  Information    Follow up with Lujean Amel, MD. Schedule an appointment as soon as possible for a visit on 07/31/2014.   Specialty:  Family Medicine   Why:  Appt. @11 :00am   Contact information:   [address] [place] [address] [phone]       Follow up with [facility].   Specialty:  [facility]   Why:  they will do your home health care at your home   Contact information:   [place] [address] [phone]       TOTAL DISCHARGE TIME: 35 minutes  [facility] Pager [phone]  07/24/2014, 3:00 PM

## 2014-07-26 LAB — CULTURE, BLOOD (ROUTINE X 2)
CULTURE: NO GROWTH
CULTURE: NO GROWTH

## 2014-08-07 ENCOUNTER — Inpatient Hospital Stay: Payer: Medicare Other | Admitting: Internal Medicine

## 2014-08-13 ENCOUNTER — Encounter (HOSPITAL_BASED_OUTPATIENT_CLINIC_OR_DEPARTMENT_OTHER): Payer: Medicare Other

## 2014-08-20 ENCOUNTER — Telehealth: Payer: Self-pay | Admitting: *Deleted

## 2014-08-20 NOTE — Telephone Encounter (Signed)
RN reviewed the pt's Amoxicillin rx.  Amoxicillin rx has additional refills on it.  Pt has not come to [facility] for return office visit since 07/12/14 [facility] visit with Dr. Linus Salmons.  Pt needs to make a follow-up visit.  RN tried to contact the pt x2.  The pt did not answer the phone.

## 2015-03-07 ENCOUNTER — Other Ambulatory Visit: Payer: Self-pay | Admitting: Radiation Oncology

## 2015-03-07 ENCOUNTER — Inpatient Hospital Stay
Admission: RE | Admit: 2015-03-07 | Discharge: 2015-03-07 | Disposition: A | Discharge status: Home / Self Care | Payer: Self-pay | Source: Ambulatory Visit | Attending: Radiation Oncology | Admitting: Radiation Oncology

## 2015-03-07 DIAGNOSIS — K6812 Psoas muscle abscess: Secondary | ICD-10-CM

## 2015-06-13 ENCOUNTER — Telehealth: Payer: Self-pay | Admitting: Lab

## 2015-06-13 NOTE — Telephone Encounter (Signed)
PCP's office called to ask [place] to call pt for follow up appmt.  Pt forgot our phone number and the name of the Dr. He had been seeing here.  I left message for him to call our office for an appmt with Dr. Linus Salmons.  Pt has not been seen here since 07/12/2014.

## 2015-09-19 ENCOUNTER — Ambulatory Visit: Payer: Medicare Other | Admitting: Internal Medicine

## 2015-10-21 ENCOUNTER — Other Ambulatory Visit: Payer: Self-pay | Admitting: *Deleted

## 2015-10-21 MED ORDER — AMOXICILLIN 500 MG PO TABS: 500.0000 mg | ORAL_TABLET | Freq: Three times a day (TID) | ORAL | 0 refills | Status: DC

## 2015-12-02 ENCOUNTER — Ambulatory Visit (INDEPENDENT_AMBULATORY_CARE_PROVIDER_SITE_OTHER): Payer: Medicare Other | Admitting: Internal Medicine

## 2015-12-02 DIAGNOSIS — R7881 Bacteremia: Secondary | ICD-10-CM | POA: Diagnosis not present

## 2015-12-02 DIAGNOSIS — M00251 Other streptococcal arthritis, right hip: Secondary | ICD-10-CM | POA: Diagnosis not present

## 2015-12-02 DIAGNOSIS — B955 Unspecified streptococcus as the cause of diseases classified elsewhere: Secondary | ICD-10-CM

## 2015-12-02 MED ORDER — AMOXICILLIN 500 MG PO TABS: 500.0000 mg | ORAL_TABLET | Freq: Two times a day (BID) | ORAL | 11 refills | Status: DC

## 2015-12-02 NOTE — Assessment & Plan Note (Signed)
He has been changed to BID amoxicillin which was the plan and will continue on that as suppressive antibiotic continuously.  I have refilled it for 1 year and he can continue to get refills by his PCP or return here in 1 year for continued refills.
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
## 2015-12-02 NOTE — Assessment & Plan Note (Signed)
Will need referral to GI for Strep bovis infection for colonoscopy, if not recently done.

## 2015-12-04 ENCOUNTER — Other Ambulatory Visit: Payer: Self-pay | Admitting: Internal Medicine

## 2016-01-26 IMAGING — CT CT GUIDANCE NEEDLE PLACEMENT
3 of 8 series · 12 of 32 positions shown, 17 images · non-contrast
Comparison: none

CLINICAL DATA: 79-year-old male with right psoas hematoma which is
suspected to be superinfected. There is also clinical concern for
possible hip joint infection.
TECHNIQUE: Informed consent was obtained from the patient following explanation
of the procedure, risks, benefits and alternatives. The patient
understands, agrees and consents for the procedure. All questions
were addressed. A time out was performed.

[Series 2: aspiration · axial · 0.98mm/px · z∈[-148,-24]mm · 5 of 39 slices shown, 10 images (1 of 3)]
[im 7/39  soft-tissue]
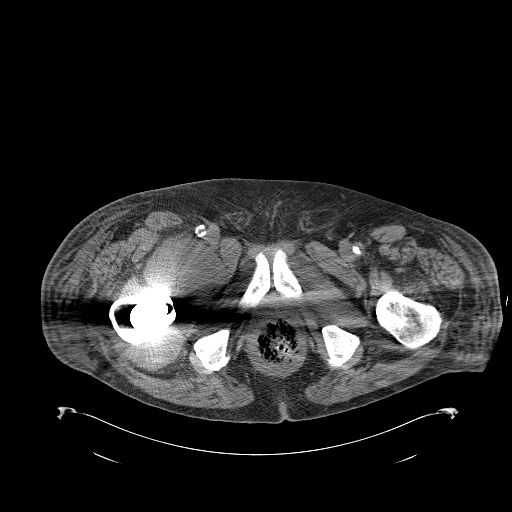
[im 7/39  bone]
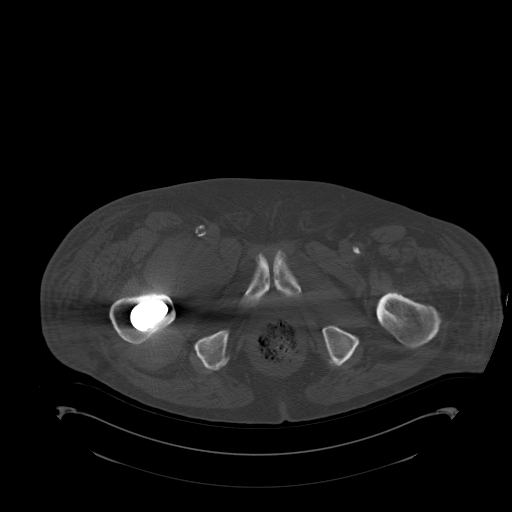
[im 13/39  soft-tissue]
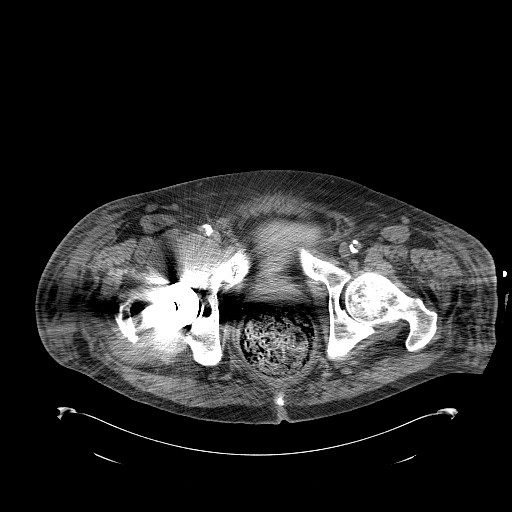
[im 13/39  lung]
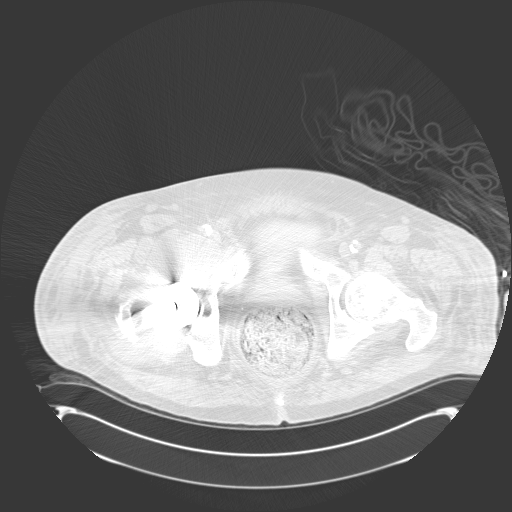
[im 20/39  soft-tissue]
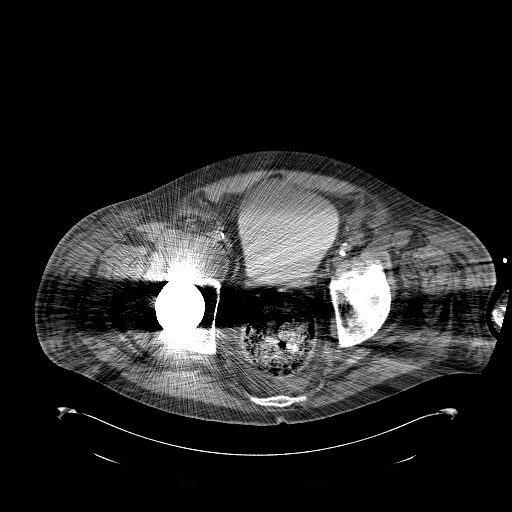
[im 20/39  lung]
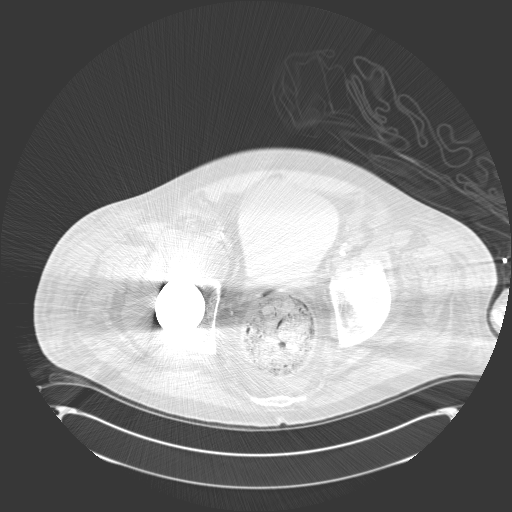
[im 26/39  soft-tissue]
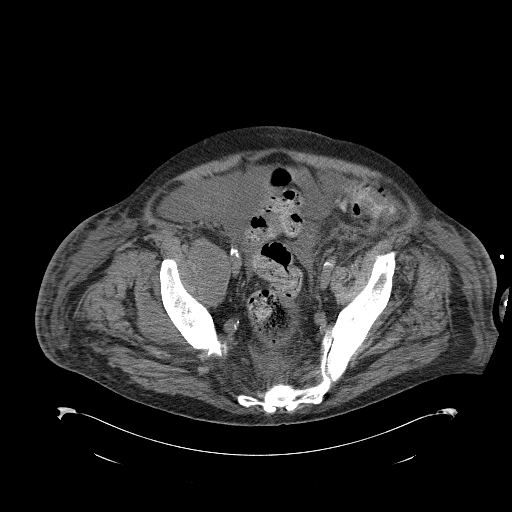
[im 26/39  lung]
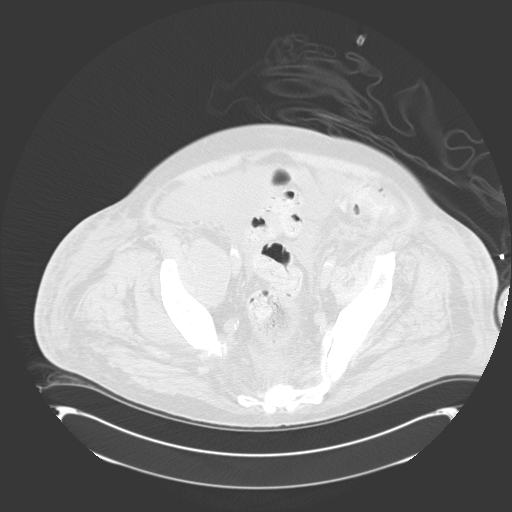
[im 32/39  soft-tissue]
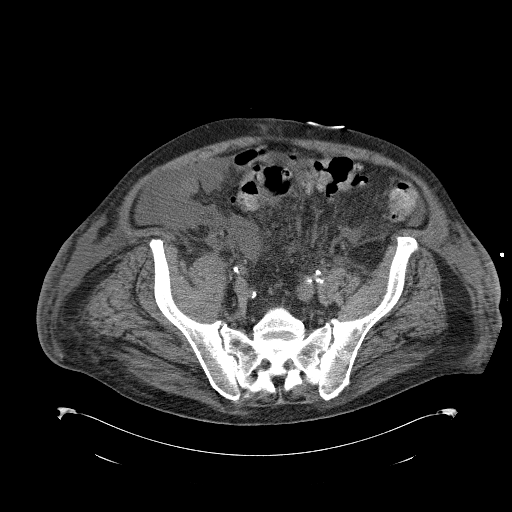
[im 32/39  lung]
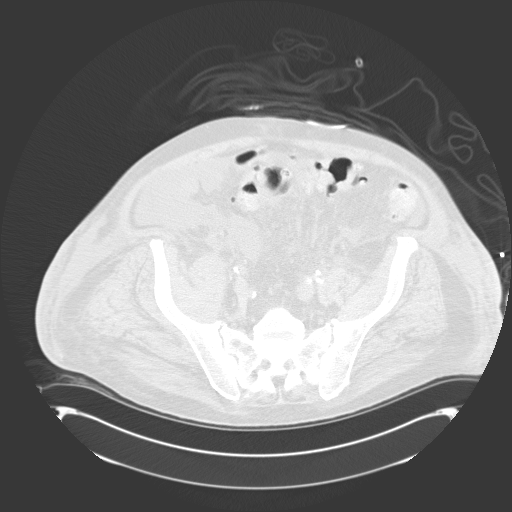

[Series 5: aspiration · axial · 0.82mm/px · z∈[-204,-84]mm · 5 of 38 slices shown (2 of 3)]
[im 7/38  soft-tissue]
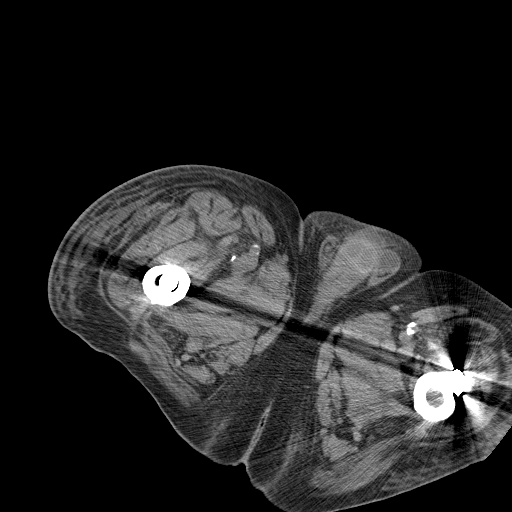
[im 13/38  soft-tissue]
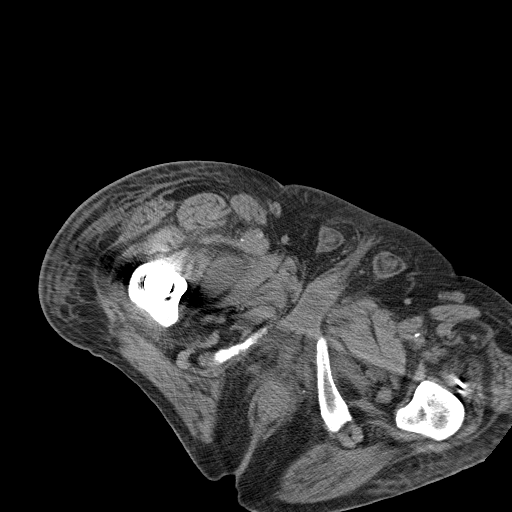
[im 19/38  soft-tissue]
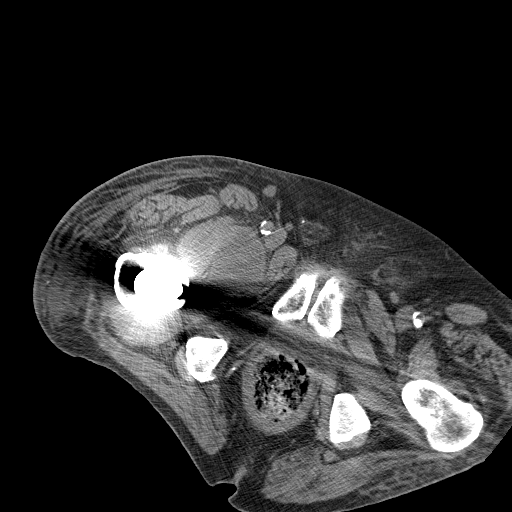
[im 25/38  soft-tissue]
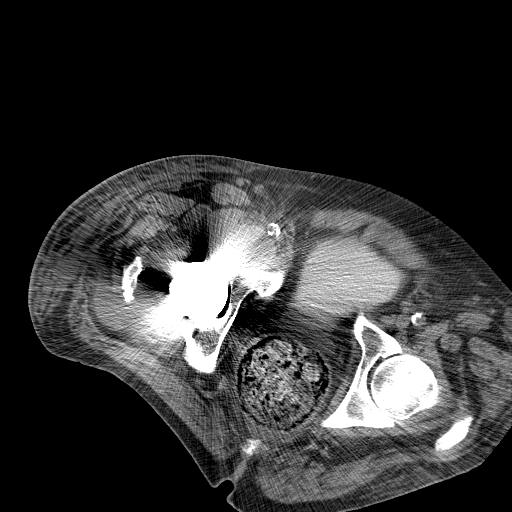
[im 31/38  soft-tissue]
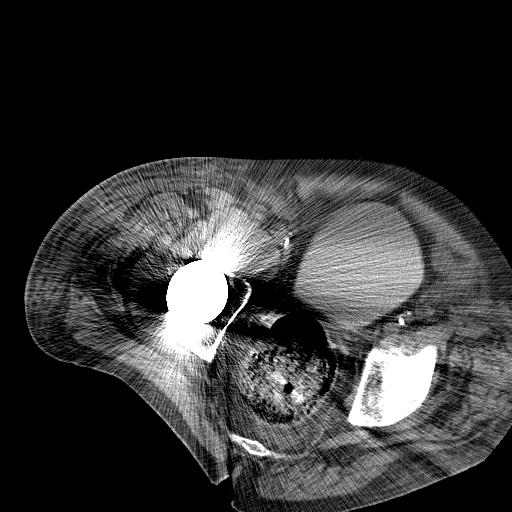

[Series 9: aspiration · axial · 0.57mm/px · z∈[-149,-114]mm · 2 of 27 slices shown (3 of 3)]
[im 7/27  soft-tissue]
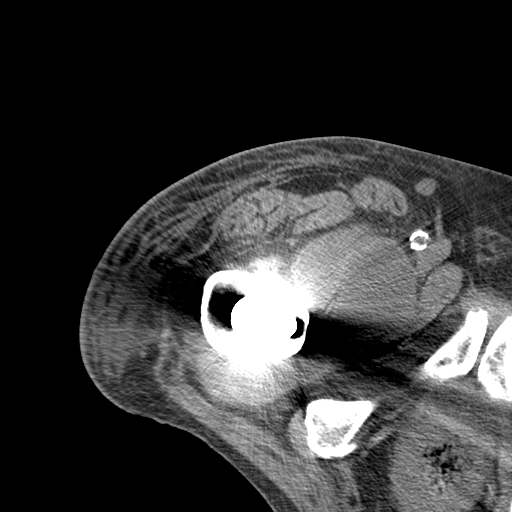
[im 14/27  soft-tissue]
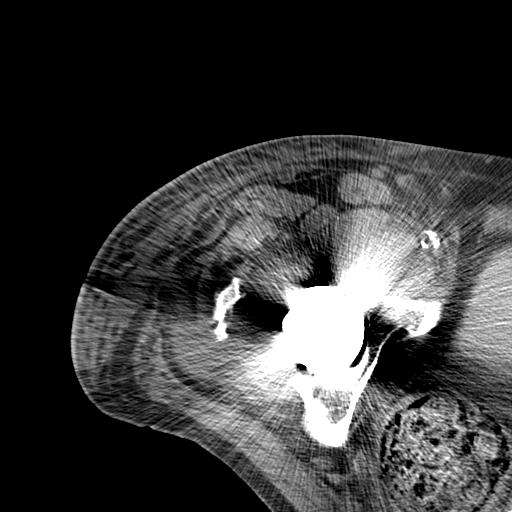

[12 of 32 positions shown; findings below may reference images not displayed]

EXAM:
CT IMAGE GUIDED DRAINAGE BY PERCUTANEOUS CATHETER; CT GUIDANCE
NEEDLE PLACEMENT

Date: 05/16/2014

PROCEDURE:
1. Aspiration right hip joint under CT guidance
2. Placement of a 14 French drain into the right psoas
hematoma/abscess

ANESTHESIA/SEDATION:
Moderate (conscious) sedation was used. 4 mg Versed, 50 mcg Fentanyl
were administered intravenously. The patient's vital signs were
monitored continuously by radiology nursing throughout the
procedure.

Sedation Time: 32 minutes

MEDICATIONS:
None additional
A planning axial CT scan was performed. Suitable skin entry sites
were selected and marked. The region was then sterilely prepped and
draped in standard fashion using Betadine skin prep.

Attention was first turned to the hip joint aspiration. Using a far
lateral approach, an 18 gauge trocar needle was advanced over the
greater tuberosity and into the hip joint. CT fluoroscopic imaging
was used but with limited effect secondary to the extensive streak
artifact surrounding the hip prosthesis. However, the needle tip was
confirmed within the joint space by the tactile feedback of metal on
metal. Aspiration yielded approximately 10 mL of thick, brown
purulent fluid. A sample was sent for Gram stain, cell count and
culture. The needle was removed.

Attention was next turned to the psoas collection. Local anesthesia
was attained by infiltration with 1% lidocaine. Using intermittent
CT fluoroscopic guidance, an 18 gauge trocar needle was advanced
into the fluid collection. An Amplatz wire was then coiled within
the fluid collection and the tract dilated to 12 French. Jnnt Domi 14
French all-purpose drainage catheter was then advanced over the wire
and coiled within the psoas collection. Aspiration yields
approximately 120 mL of thick, brown purulent fluid. The appearance
of the fluid is almost identical to that which was aspirated from
the hip joint suggesting a frank communication.

The drain was then flushed with sterile saline and connected to JP
bulb drainage. The drain was secured to the skin with 0 Prolene
suture and an adhesive fixation device. The patient tolerated the
procedure well.

COMPLICATIONS:
None
IMPRESSION: 1. Aspiration of right hip joint yields approximately 10 mL thick,
brown purulent fluid. Grossly, this fluid is infected. Samples were
sent for Gram stain, cell count and culture.
2. Placement of a 14 French drain into the right psoas collection.
Of note, the psoas collection was drained more inferiorly than
previously attempted in the superior and medial aspect of the thigh
rather than within the anatomic pelvis. Aspiration yields 120 mL
thick, brown purulent fluid. The appearance of the fluid is
identical to that aspirated from the hip joint.
These results were called by telephone at the time of interpretation
on 05/16/2014 at [DATE] to Dr. Dr. Monet Lazo, who verbally
acknowledged these results.

## 2016-01-28 IMAGING — DX DG CHEST 1V PORT
1 series · 1 of 1 positions shown · non-contrast
Comparison: May 11, 2014.

CLINICAL DATA: PICC line placement.

EXAM:
PORTABLE CHEST - 1 VIEW

[chest ap]
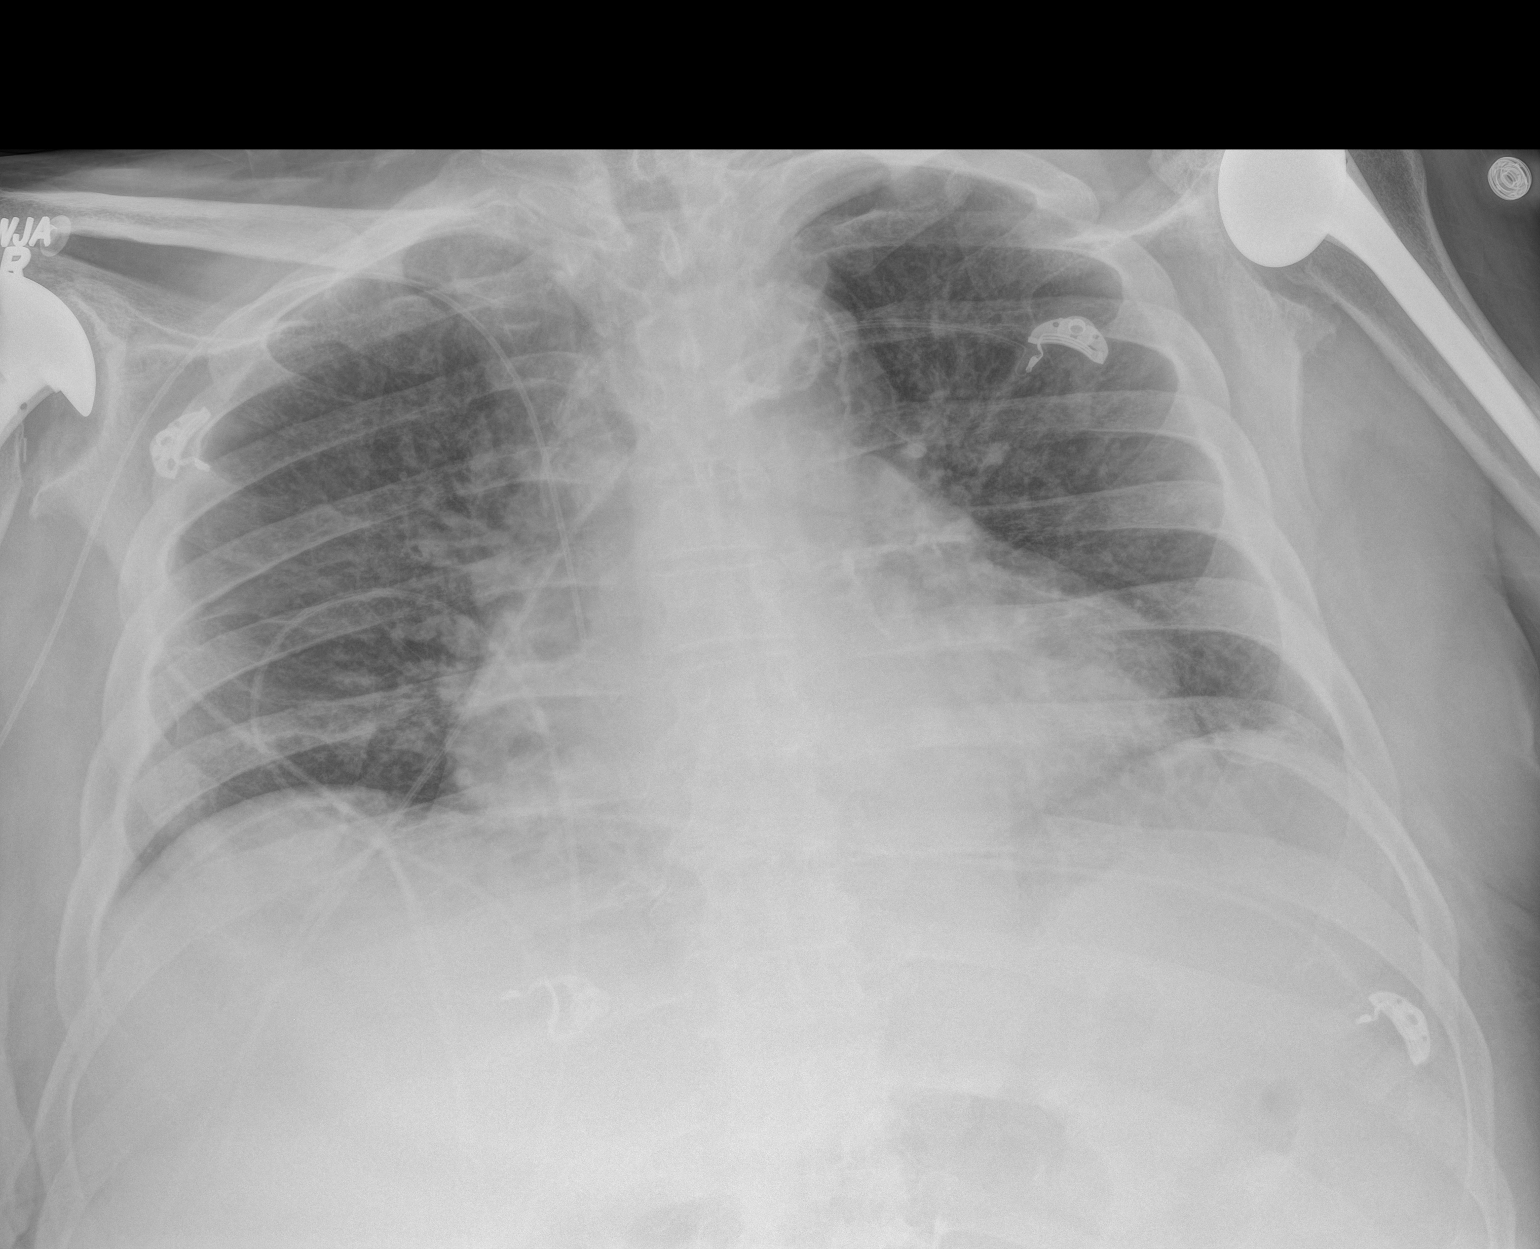

[1 of 1 positions shown; findings below may reference images not displayed]

FINDINGS: Stable cardiomediastinal silhouette. No pneumothorax or pleural
effusion is noted. Interval placement of right-sided PICC line with
distal tip overlying expected position of the SVC. Both lungs are
clear. Status post bilateral shoulder arthroplasty.
IMPRESSION: Interval placement of right-sided PICC line with distal tip
overlying expected position of the SVC.

## 2016-12-09 ENCOUNTER — Other Ambulatory Visit: Payer: Self-pay | Admitting: Internal Medicine

## 2016-12-09 DIAGNOSIS — M00251 Other streptococcal arthritis, right hip: Secondary | ICD-10-CM

## 2017-01-08 ENCOUNTER — Other Ambulatory Visit: Payer: Self-pay | Admitting: Internal Medicine

## 2017-01-08 DIAGNOSIS — M00251 Other streptococcal arthritis, right hip: Secondary | ICD-10-CM

## 2017-01-11 ENCOUNTER — Other Ambulatory Visit: Payer: Self-pay | Admitting: *Deleted

## 2017-01-11 DIAGNOSIS — M00251 Other streptococcal arthritis, right hip: Secondary | ICD-10-CM

## 2017-01-11 MED ORDER — AMOXICILLIN 500 MG PO CAPS: ORAL_CAPSULE | ORAL | 0 refills | Status: DC

## 2017-02-05 ENCOUNTER — Other Ambulatory Visit: Payer: Self-pay | Admitting: Internal Medicine

## 2017-02-05 DIAGNOSIS — M00251 Other streptococcal arthritis, right hip: Secondary | ICD-10-CM

## 2017-02-18 ENCOUNTER — Encounter: Payer: Self-pay | Admitting: Internal Medicine

## 2017-02-18 ENCOUNTER — Ambulatory Visit: Payer: Medicare Other | Admitting: Internal Medicine

## 2017-02-18 DIAGNOSIS — F1020 Alcohol dependence, uncomplicated: Secondary | ICD-10-CM | POA: Diagnosis not present

## 2017-02-18 DIAGNOSIS — M00251 Other streptococcal arthritis, right hip: Secondary | ICD-10-CM

## 2017-02-18 MED ORDER — AMOXICILLIN 500 MG PO CAPS: ORAL_CAPSULE | ORAL | 11 refills | Status: DC

## 2017-02-18 NOTE — Progress Notes (Signed)
   Subjective:    Patient ID: [name], male    DOB: July 12, 1935, 82 y.o.   MRN: [id number]  HPI [name] SEESE is a 82 y.o. male with chronic alcoholism, DM, HTN and gout came in with complaint of pain in right abdomen and hip. History of hip replacement in right. Was febrile and also noted to be in Afib. CT revealed iliopsoas abscess which was drained and blood cultures with 1/2 Strep Group D. Also has been somnolent and requiring ativan for presumed DTs. High BPs and tachycardic due to the Afib. Came in to hospital with sepsis and had 1 blood culture positive for Strep bovis and noted Psoas abscess which grew Strep viridans.  Then with continued leukocytosis, he had his hip joint aspirated and noted pus and also grew Strep bovis.  He was seen by Dr. Berenice Primas who recommended removal of prosthetic hip.  Patient however refused despite the information that it will likely not clear without prosthesis removal.  He continued to refuse and plan was to continue with IV antibiotics for 6 weeks and then convert to oral chronic suppression with amoxicillin which he has continued for over 2 years.    I last saw him in November 2017 and was having no issues with his hip.  Had no significant pain, warmth.  Continues on amoxicillin twice a day.  No associated n/v;d.  No rash.   Review of Systems  Constitutional: Negative for chills and fever.  Gastrointestinal: Negative for diarrhea and nausea.  Musculoskeletal: Negative for arthralgias.  Skin: Negative for rash.  Neurological: Negative for dizziness and light-headedness.       Objective:   Physical Exam  Constitutional: He appears well-developed and well-nourished. No distress.  Cardiovascular: Normal rate, regular rhythm and normal heart sounds.  No murmur heard. Pulmonary/Chest: Effort normal and breath sounds normal. No respiratory distress.  Musculoskeletal: Normal range of motion.     Skin: No rash noted.          Assessment & Plan:

## 2017-02-18 NOTE — Assessment & Plan Note (Signed)
Remains alcohol free now except for occasional beer.

## 2017-02-18 NOTE — Assessment & Plan Note (Signed)
He continues on suppressive amoxicillin indefinitely due to refusal for removal.  rtc 1 year.  1 year of amoxicillin sent.

## 2018-02-21 ENCOUNTER — Ambulatory Visit: Payer: Medicare Other | Admitting: Internal Medicine

## 2018-02-27 ENCOUNTER — Other Ambulatory Visit: Payer: Self-pay | Admitting: Internal Medicine

## 2018-02-27 DIAGNOSIS — M00251 Other streptococcal arthritis, right hip: Secondary | ICD-10-CM

## 2018-02-28 ENCOUNTER — Telehealth: Payer: Self-pay | Admitting: Behavioral Health

## 2018-02-28 NOTE — Telephone Encounter (Signed)
Called patient to reschedule missed appointment.  Left call back number. Pricilla Riffle RN

## 2018-03-27 ENCOUNTER — Other Ambulatory Visit: Payer: Self-pay | Admitting: Internal Medicine

## 2018-03-27 DIAGNOSIS — M00251 Other streptococcal arthritis, right hip: Secondary | ICD-10-CM

## 2018-03-29 ENCOUNTER — Other Ambulatory Visit: Payer: Self-pay | Admitting: Behavioral Health

## 2018-03-29 DIAGNOSIS — M00251 Other streptococcal arthritis, right hip: Secondary | ICD-10-CM

## 2018-03-29 MED ORDER — AMOXICILLIN 500 MG PO CAPS: 500.0000 mg | ORAL_CAPSULE | Freq: Two times a day (BID) | ORAL | 1 refills | Status: DC

## 2018-05-25 ENCOUNTER — Ambulatory Visit: Payer: Medicare Other | Admitting: Internal Medicine

## 2018-07-02 ENCOUNTER — Other Ambulatory Visit: Payer: Self-pay | Admitting: Internal Medicine

## 2018-07-02 DIAGNOSIS — M00251 Other streptococcal arthritis, right hip: Secondary | ICD-10-CM

## 2018-07-10 ENCOUNTER — Other Ambulatory Visit: Payer: Self-pay | Admitting: Internal Medicine

## 2018-07-10 DIAGNOSIS — M00251 Other streptococcal arthritis, right hip: Secondary | ICD-10-CM

## 2018-09-08 ENCOUNTER — Telehealth: Payer: Self-pay

## 2018-09-08 ENCOUNTER — Other Ambulatory Visit: Payer: Self-pay | Admitting: Internal Medicine

## 2018-09-08 DIAGNOSIS — M00251 Other streptococcal arthritis, right hip: Secondary | ICD-10-CM

## 2018-09-08 NOTE — Telephone Encounter (Signed)
Attempted to call patient to schedule overdue appointment with our office. Patient last seen on 02/18/17. Has missed last two appointment with office for follow up.  Left voicemail requesting patient call office back to schedule appointment, Aundria Rud, [place]

## 2018-10-13 ENCOUNTER — Other Ambulatory Visit: Payer: Self-pay | Admitting: Internal Medicine

## 2018-10-13 DIAGNOSIS — M00251 Other streptococcal arthritis, right hip: Secondary | ICD-10-CM

## 2018-11-16 ENCOUNTER — Other Ambulatory Visit: Payer: Self-pay | Admitting: Internal Medicine

## 2018-11-16 ENCOUNTER — Telehealth: Payer: Self-pay

## 2018-11-16 DIAGNOSIS — M00251 Other streptococcal arthritis, right hip: Secondary | ICD-10-CM

## 2018-11-16 NOTE — Telephone Encounter (Signed)
Called patient to schedule overdue appointment. Unable to reach patient at this time. Left voicemail requesting a call back [name], [place]

## 2018-12-01 ENCOUNTER — Other Ambulatory Visit: Payer: Self-pay | Admitting: Internal Medicine

## 2018-12-01 DIAGNOSIS — M00251 Other streptococcal arthritis, right hip: Secondary | ICD-10-CM

## 2018-12-05 ENCOUNTER — Telehealth: Payer: Self-pay | Admitting: *Deleted

## 2018-12-05 DIAGNOSIS — M00251 Other streptococcal arthritis, right hip: Secondary | ICD-10-CM

## 2018-12-05 MED ORDER — AMOXICILLIN 500 MG PO CAPS: 500.0000 mg | ORAL_CAPSULE | Freq: Two times a day (BID) | ORAL | 2 refills | Status: DC

## 2018-12-05 NOTE — Telephone Encounter (Signed)
Received refill request for amoxicillin.  RN left message for the patient that we do offer phone visits if this is more convenient. OK per Dr Linus Salmons to go ahead and send in 1 year as it may be difficult for patient to come in for visit during current pandemic.   Landis Gandy, RN

## 2018-12-28 ENCOUNTER — Other Ambulatory Visit: Payer: Self-pay | Admitting: Internal Medicine

## 2018-12-28 DIAGNOSIS — M00251 Other streptococcal arthritis, right hip: Secondary | ICD-10-CM

## 2019-01-27 ENCOUNTER — Ambulatory Visit: Payer: Medicare Other | Attending: Internal Medicine

## 2019-01-27 DIAGNOSIS — Z23 Encounter for immunization: Secondary | ICD-10-CM

## 2019-01-27 NOTE — Progress Notes (Signed)
   Covid-19 Vaccination Clinic  Name:  [name]    MRN: [id number] DOB: Nov 07, 1935  01/27/2019  [name] was observed post Covid-19 immunization for 15 minutes without incidence. He was provided with Vaccine Information Sheet and instruction to access the V-Safe system.   [name] was instructed to call 911 with any severe reactions post vaccine: Marland Kitchen Difficulty breathing  . Swelling of your face and throat  . A fast heartbeat  . A bad rash all over your body  . Dizziness and weakness    Immunizations Administered    Name Date Dose VIS Date Route   Pfizer COVID-19 Vaccine 01/27/2019  2:05 PM 0.3 mL 12/16/2018 Intramuscular   Manufacturer: [place]   Lot: [id number]   [place]: [id number]

## 2019-02-14 ENCOUNTER — Ambulatory Visit: Payer: Medicare Other | Attending: Internal Medicine

## 2019-02-14 DIAGNOSIS — Z23 Encounter for immunization: Secondary | ICD-10-CM | POA: Insufficient documentation

## 2019-10-08 ENCOUNTER — Other Ambulatory Visit: Payer: Self-pay | Admitting: Internal Medicine

## 2019-10-08 DIAGNOSIS — M00251 Other streptococcal arthritis, right hip: Secondary | ICD-10-CM

## 2019-10-09 ENCOUNTER — Telehealth: Payer: Self-pay

## 2019-10-09 NOTE — Telephone Encounter (Signed)
Called patient to schedule overdue appointment with office. Had to leave voicemail requesting a call back. Will send mychart message requesting patient schedule appointment with office. [name]

## 2019-11-12 ENCOUNTER — Other Ambulatory Visit: Payer: Self-pay | Admitting: Internal Medicine

## 2019-11-12 DIAGNOSIS — M00251 Other streptococcal arthritis, right hip: Secondary | ICD-10-CM

## 2019-12-04 ENCOUNTER — Other Ambulatory Visit: Payer: Self-pay | Admitting: Internal Medicine

## 2019-12-04 DIAGNOSIS — M00251 Other streptococcal arthritis, right hip: Secondary | ICD-10-CM

## 2020-01-07 ENCOUNTER — Other Ambulatory Visit: Payer: Self-pay | Admitting: Internal Medicine

## 2020-01-07 DIAGNOSIS — M00251 Other streptococcal arthritis, right hip: Secondary | ICD-10-CM

## 2020-02-12 ENCOUNTER — Other Ambulatory Visit: Payer: Self-pay | Admitting: Internal Medicine

## 2020-02-12 DIAGNOSIS — M00251 Other streptococcal arthritis, right hip: Secondary | ICD-10-CM

## 2020-02-13 ENCOUNTER — Telehealth: Payer: Self-pay | Admitting: *Deleted

## 2020-02-13 NOTE — Telephone Encounter (Signed)
Left message for patient and his daughter, asked them to please call back. Received refill request for amoxicillin 500 mg twice daily. Patient last seen 2019. Dr Linus Salmons authorized 1 year supply 11/2018, but we have not had any contact from patient. Dr Linus Salmons has availability this week. Asked patient/daughter to please call to schedule an appointment. Landis Gandy, RN

## 2020-02-15 NOTE — Telephone Encounter (Signed)
Message to Dr Linus Salmons

## 2020-02-15 NOTE — Telephone Encounter (Signed)
Please advise on amoxicillin 500 mg twice daily refills. Thanks! Landis Gandy, RN

## 2020-02-15 NOTE — Telephone Encounter (Signed)
Patient returning call, RN attempted to schedule patient with Dr. Linus Salmons. Patient said he will call back later to schedule. RN offered to schedule him at this time while he was on the phone, patient again states he will call back later.   Beryle Flock, RN

## 2020-02-16 ENCOUNTER — Other Ambulatory Visit: Payer: Self-pay | Admitting: Internal Medicine

## 2020-02-16 DIAGNOSIS — M00251 Other streptococcal arthritis, right hip: Secondary | ICD-10-CM

## 2020-02-16 MED ORDER — AMOXICILLIN 500 MG PO CAPS: 500.0000 mg | ORAL_CAPSULE | Freq: Two times a day (BID) | ORAL | 0 refills | Status: DC

## 2020-02-16 NOTE — Telephone Encounter (Signed)
On month refill sent with a message of no more refills until seen.  thanks

## 2020-02-16 NOTE — Telephone Encounter (Signed)
Attempted to call patient to relay that Dr. Linus Salmons has sent in one month of amoxicillin, but that a follow up appointment is needed for additional refills, no answer. Left HIPAA compliant voicemail requesting callback.   Beryle Flock, RN

## 2020-03-01 ENCOUNTER — Other Ambulatory Visit: Payer: Self-pay | Admitting: Internal Medicine

## 2020-03-01 DIAGNOSIS — M00251 Other streptococcal arthritis, right hip: Secondary | ICD-10-CM

## 2020-04-08 ENCOUNTER — Other Ambulatory Visit: Payer: Self-pay | Admitting: Internal Medicine

## 2020-04-08 DIAGNOSIS — M00251 Other streptococcal arthritis, right hip: Secondary | ICD-10-CM

## 2020-04-17 DIAGNOSIS — M170 Bilateral primary osteoarthritis of knee: Secondary | ICD-10-CM | POA: Diagnosis not present

## 2020-04-17 DIAGNOSIS — M109 Gout, unspecified: Secondary | ICD-10-CM | POA: Diagnosis not present

## 2020-04-17 DIAGNOSIS — E7800 Pure hypercholesterolemia, unspecified: Secondary | ICD-10-CM | POA: Diagnosis not present

## 2020-04-17 DIAGNOSIS — I10 Essential (primary) hypertension: Secondary | ICD-10-CM | POA: Diagnosis not present

## 2020-04-17 DIAGNOSIS — T8459XS Infection and inflammatory reaction due to other internal joint prosthesis, sequela: Secondary | ICD-10-CM | POA: Diagnosis not present

## 2020-04-17 DIAGNOSIS — B351 Tinea unguium: Secondary | ICD-10-CM | POA: Diagnosis not present

## 2020-04-17 DIAGNOSIS — Z79899 Other long term (current) drug therapy: Secondary | ICD-10-CM | POA: Diagnosis not present

## 2020-04-17 DIAGNOSIS — E1140 Type 2 diabetes mellitus with diabetic neuropathy, unspecified: Secondary | ICD-10-CM | POA: Diagnosis not present

## 2020-04-17 DIAGNOSIS — Z96649 Presence of unspecified artificial hip joint: Secondary | ICD-10-CM | POA: Diagnosis not present

## 2020-05-07 ENCOUNTER — Other Ambulatory Visit: Payer: Self-pay | Admitting: Internal Medicine

## 2020-05-07 DIAGNOSIS — M00251 Other streptococcal arthritis, right hip: Secondary | ICD-10-CM

## 2020-05-07 NOTE — Telephone Encounter (Signed)
Patient scheduled 05/09/20

## 2020-05-09 ENCOUNTER — Ambulatory Visit: Payer: Medicare Other | Admitting: Podiatry

## 2020-05-09 ENCOUNTER — Encounter: Payer: Self-pay | Admitting: Podiatry

## 2020-05-09 ENCOUNTER — Encounter: Payer: Self-pay | Admitting: Internal Medicine

## 2020-05-09 ENCOUNTER — Ambulatory Visit: Payer: Medicare Other

## 2020-05-09 ENCOUNTER — Other Ambulatory Visit: Payer: Self-pay

## 2020-05-09 ENCOUNTER — Telehealth (INDEPENDENT_AMBULATORY_CARE_PROVIDER_SITE_OTHER): Payer: Medicare Other | Admitting: Internal Medicine

## 2020-05-09 DIAGNOSIS — E118 Type 2 diabetes mellitus with unspecified complications: Secondary | ICD-10-CM

## 2020-05-09 DIAGNOSIS — M79676 Pain in unspecified toe(s): Secondary | ICD-10-CM | POA: Diagnosis not present

## 2020-05-09 DIAGNOSIS — B351 Tinea unguium: Secondary | ICD-10-CM | POA: Diagnosis not present

## 2020-05-09 DIAGNOSIS — M00251 Other streptococcal arthritis, right hip: Secondary | ICD-10-CM

## 2020-05-09 MED ORDER — AMOXICILLIN 500 MG PO CAPS: 500.0000 mg | ORAL_CAPSULE | Freq: Two times a day (BID) | ORAL | 11 refills | Status: DC

## 2020-05-09 NOTE — Progress Notes (Signed)
   Subjective:    Patient ID: [name], male    DOB: 18-Jan-1935, 85 y.o.   MRN: [id number] I connected with  [name] on 05/09/20 by a video enabled telemedicine application and verified that I am speaking with the correct person using two identifiers.   I discussed the limitations of evaluation and management by telemedicine. The patient expressed understanding and agreed to proceed.  Location: Patient - home Physician - clinic  Duration of visit: 12 minutes  HPI He is here for follow up of right hip chronic infection on chronic suppression with amoxicillin.   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  He has not been seen in 3 years but has remained on amoxicillin since that time, except has been out the last 1 week.  He is having no drainage from his hip, it is closed with no signficant change in pain or swelling.  He has no concerns.     Review of Systems  Constitutional: Negative for chills and fever.  Gastrointestinal: Negative for diarrhea and nausea.  Skin: Negative for rash.       Objective:   Physical Exam Constitutional:      Appearance: Normal appearance.  Eyes:     General: No scleral icterus. Neurological:     Mental Status: He is alert.  Psychiatric:        Mood and Affect: Mood normal.           Assessment & Plan:

## 2020-05-09 NOTE — Assessment & Plan Note (Signed)
Chronic infection but he continues to do well and so will continue with suppressive amoxicillin lifelong.  He will rtc in 1 year.

## 2020-05-09 NOTE — Progress Notes (Signed)
Subjective:  Patient ID: [name], male    DOB: 1935/07/12,  MRN: [id number] HPI Chief Complaint  Patient presents with  . Debridement    Trim toenails    85 y.o. male presents with the above complaint.   ROS: Denies fever chills nausea vomiting muscle aches pains calf pain back pain chest pain shortness of breath.  Past Medical History:  Diagnosis Date  . Acute encephalopathy 05/09/2014  . Alcoholism ([place])   . Arthritis   . Cerebral infarction due to embolism of left middle cerebral artery ([place])   . Diabetes mellitus type 2, controlled ([place]) 05/04/2014  . Essential hypertension 05/04/2014  . Gout 05/04/2014  . Hypertension   . New onset atrial fibrillation ([place]) 05/04/2014  . Psoas abscess ([place]) 05/04/2014  . Psoas abscess, right ([place])   . SDH (subdural hematoma) ([place]) 05/12/2014  . Sepsis ([place]) 05/04/2014  . Septic arthritis of hip ([place]) 05/18/2014  . Stenosis of right carotid artery    40-59 percent   . Streptococcal bacteremia 05/09/2014   Past Surgical History:  Procedure Laterality Date  . HIP ARTHROPLASTY Right   . JOINT REPLACEMENT     right hip, bilateral knees  . right cataract extraction      Current Outpatient Medications:  .  ALPHA LIPOIC ACID PO, Take 1 capsule by mouth 2 (two) times daily. For gout, Disp: , Rfl:  .  Amino Acids-Protein Hydrolys (FEEDING SUPPLEMENT, PRO-STAT SUGAR FREE 64,) LIQD, Place 30 mLs into feeding tube 2 (two) times daily., Disp: 900 mL, Rfl: 0 .  amoxicillin (AMOXIL) 500 MG capsule, Take 1 capsule (500 mg total) by mouth 2 (two) times daily., Disp: 60 capsule, Rfl: 11 .  atenolol (TENORMIN) 50 MG tablet, Take 50 mg by mouth daily., Disp: , Rfl:  .  celecoxib (CELEBREX) 200 MG capsule, Take 200 mg by mouth 2 (two) times daily., Disp: , Rfl: 3 .  Cholecalciferol 25 MCG (1000 UT) capsule, Take 1,000 Units by mouth daily., Disp: , Rfl:  .  collagenase (SANTYL) ointment, Apply topically daily., Disp: 15 g, Rfl: 0 .  CRESTOR 10 MG tablet,  Take 10 mg by mouth daily. Takes one every other day, Disp: , Rfl: 0 .  diltiazem (CARDIZEM CD) 360 MG 24 hr capsule, Take 1 capsule (360 mg total) by mouth daily., Disp: , Rfl:  .  feeding supplement, ENSURE ENLIVE, (ENSURE ENLIVE) LIQD, Take 237 mLs by mouth 2 (two) times daily between meals., Disp: 237 mL, Rfl: 12 .  fluticasone (FLONASE) 50 MCG/ACT nasal spray, SMARTSIG:2 Spray(s) Both Nares Daily PRN, Disp: , Rfl:  .  folic acid (FOLVITE) 1 MG tablet, Take 1 tablet (1 mg total) by mouth daily., Disp: , Rfl:  .  furosemide (LASIX) 40 MG tablet, Take 1 tablet (40 mg total) by mouth daily., Disp: 30 tablet, Rfl: 1 .  hydrALAZINE (APRESOLINE) 25 MG tablet, Take 1 tablet (25 mg total) by mouth every 8 (eight) hours., Disp: , Rfl:  .  irbesartan (AVAPRO) 75 MG tablet, Take 75 mg by mouth daily., Disp: , Rfl:  .  LYRICA 75 MG capsule, Take 75-150 mg by mouth 2 (two) times daily. 75mg  in the morning and 150mg  at night, Disp: , Rfl:  .  metFORMIN (GLUCOPHAGE) 1000 MG tablet, Take 1,000 mg by mouth daily with breakfast. , Disp: , Rfl:  .  Multiple Vitamin (MULTIVITAMIN WITH MINERALS) TABS tablet, Take 1 tablet by mouth daily., Disp: , Rfl:  .  polyethylene glycol (MIRALAX /  GLYCOLAX) packet, Take 17 g by mouth daily. (Patient not taking: No sig reported), Disp: 14 each, Rfl: 0 .  potassium chloride SA (K-DUR,KLOR-CON) 20 MEQ tablet, Take 1 tablet (20 mEq total) by mouth daily., Disp: 30 tablet, Rfl: 1 .  saccharomyces boulardii (FLORASTOR) 250 MG capsule, Take 1 capsule (250 mg total) by mouth 2 (two) times daily., Disp: , Rfl:  .  thiamine 100 MG tablet, Take 1 tablet (100 mg total) by mouth daily., Disp: , Rfl:  .  traMADol (ULTRAM) 50 MG tablet, Take 2 tablets (100 mg total) by mouth every 6 (six) hours as needed. Every 4 hours, Disp: 30 tablet, Rfl: 0 .  ULORIC 80 MG TABS, Take 1 tablet by mouth daily., Disp: , Rfl: 3 .  vitamin C (ASCORBIC ACID) 500 MG tablet, Take 500 mg by mouth every other day.,  Disp: , Rfl:  .  XARELTO 20 MG TABS tablet, Take 20 mg by mouth every morning., Disp: , Rfl:   Allergies  Allergen Reactions  . Allopurinol Other (See Comments)    Makes head 'feel funny'  . Atorvastatin Other (See Comments)  . Motrin [Ibuprofen] Other (See Comments)    Blisters in groin area   Review of Systems Objective:  There were no vitals filed for this visit.  General: Well developed, nourished, in no acute distress, alert and oriented x3   Dermatological: Skin is warm, dry and supple bilateral. Nails x 10 are thick yellow dystrophic clinically mycotic painful palpation as well as debridement.  Remaining integument appears dry and xerotic at this time. There are no open sores, no preulcerative lesions, no rash or signs of infection present.  Vascular: Dorsalis Pedis artery and Posterior Tibial artery pedal pulses are 2/4 bilateral with immedate capillary fill time. Pedal hair growth present. No varicosities and no lower extremity edema present bilateral.   Neruologic: Grossly intact via light touch bilateral. Vibratory intact via tuning fork bilateral. Protective threshold with Semmes Wienstein monofilament diminished to all pedal sites bilateral. Patellar and Achilles deep tendon reflexes 2+ bilateral. No Babinski or clonus noted bilateral.   Musculoskeletal: No gross boney pedal deformities bilateral. No pain, crepitus, or limitation noted with foot and ankle range of motion bilateral. Muscular strength 5/5 in all groups tested bilateral.  Gait: Unassisted, Nonantalgic.    Radiographs:  None taken  Assessment & Plan:   Assessment: Pain in limb secondary to onychomycosis  Plan: Debrided toenails 1 through 5 bilateral.     Kojo Liby T. [place], [place]

## 2020-05-09 NOTE — Assessment & Plan Note (Signed)
He reports this has been stable, no issues.

## 2020-08-06 ENCOUNTER — Ambulatory Visit: Payer: Medicare Other | Admitting: Podiatry

## 2020-08-12 ENCOUNTER — Encounter: Payer: Self-pay | Admitting: Podiatry

## 2020-08-12 ENCOUNTER — Other Ambulatory Visit: Payer: Self-pay

## 2020-08-12 ENCOUNTER — Ambulatory Visit: Payer: Medicare Other | Admitting: Podiatry

## 2020-08-12 DIAGNOSIS — M79676 Pain in unspecified toe(s): Secondary | ICD-10-CM | POA: Diagnosis not present

## 2020-08-12 DIAGNOSIS — B351 Tinea unguium: Secondary | ICD-10-CM

## 2020-08-12 DIAGNOSIS — E1159 Type 2 diabetes mellitus with other circulatory complications: Secondary | ICD-10-CM | POA: Insufficient documentation

## 2020-08-12 NOTE — Progress Notes (Signed)
This patient returns to my office for at risk foot care.  This patient requires this care by a professional since this patient will be at risk due to having type 2 diabetes  This patient is unable to cut nails himself since the patient cannot reach his nails.These nails are painful walking and wearing shoes.  This patient presents for at risk foot care today.  General Appearance  Alert, conversant and in no acute stress.  Vascular  Dorsalis pedis and posterior tibial  pulses are  weakly palpable  bilaterally.  Capillary return is within normal limits  bilaterally. Cold feet  bilaterally. Absent digital hair  B/l.  Neurologic  Senn-Weinstein monofilament wire test diminished  bilaterally. Muscle power within normal limits bilaterally.  Nails Thick disfigured discolored nails with subungual debris  from hallux to fifth toes bilaterally. No evidence of bacterial infection or drainage bilaterally.  Orthopedic  No limitations of motion  feet .  No crepitus or effusions noted.  No bony pathology or digital deformities noted.  HAV  B/L.  Skin  normotropic skin with no porokeratosis noted bilaterally.  No signs of infections or ulcers noted.     Onychomycosis  Pain in right toes  Pain in left toes  Consent was obtained for treatment procedures.   Mechanical debridement of nails 1-5  bilaterally performed with a nail nipper.  Filed with dremel without incident.    Return office visit    3 months                  Told patient to return for periodic foot care and evaluation due to potential at risk complications.   Gardiner Barefoot DPM

## 2020-11-08 DIAGNOSIS — Z23 Encounter for immunization: Secondary | ICD-10-CM | POA: Diagnosis not present

## 2020-12-11 DIAGNOSIS — I10 Essential (primary) hypertension: Secondary | ICD-10-CM | POA: Diagnosis not present

## 2020-12-11 DIAGNOSIS — E1140 Type 2 diabetes mellitus with diabetic neuropathy, unspecified: Secondary | ICD-10-CM | POA: Diagnosis not present

## 2020-12-11 DIAGNOSIS — I482 Chronic atrial fibrillation, unspecified: Secondary | ICD-10-CM | POA: Diagnosis not present

## 2020-12-11 DIAGNOSIS — M109 Gout, unspecified: Secondary | ICD-10-CM | POA: Diagnosis not present

## 2020-12-11 DIAGNOSIS — E7800 Pure hypercholesterolemia, unspecified: Secondary | ICD-10-CM | POA: Diagnosis not present

## 2020-12-11 DIAGNOSIS — Z96649 Presence of unspecified artificial hip joint: Secondary | ICD-10-CM | POA: Diagnosis not present

## 2021-06-23 ENCOUNTER — Other Ambulatory Visit: Payer: Self-pay | Admitting: Internal Medicine

## 2021-06-23 DIAGNOSIS — M00251 Other streptococcal arthritis, right hip: Secondary | ICD-10-CM

## 2021-06-23 NOTE — Telephone Encounter (Signed)
Patient scheduled for follow up in August. Please advise if okay to refill.

## 2021-07-23 DIAGNOSIS — M6530 Trigger finger, unspecified finger: Secondary | ICD-10-CM | POA: Diagnosis not present

## 2021-07-23 DIAGNOSIS — Z79899 Other long term (current) drug therapy: Secondary | ICD-10-CM | POA: Diagnosis not present

## 2021-07-23 DIAGNOSIS — E7800 Pure hypercholesterolemia, unspecified: Secondary | ICD-10-CM | POA: Diagnosis not present

## 2021-07-23 DIAGNOSIS — M00251 Other streptococcal arthritis, right hip: Secondary | ICD-10-CM | POA: Diagnosis not present

## 2021-07-23 DIAGNOSIS — M109 Gout, unspecified: Secondary | ICD-10-CM | POA: Diagnosis not present

## 2021-07-23 DIAGNOSIS — L97512 Non-pressure chronic ulcer of other part of right foot with fat layer exposed: Secondary | ICD-10-CM | POA: Diagnosis not present

## 2021-07-23 DIAGNOSIS — Z0000 Encounter for general adult medical examination without abnormal findings: Secondary | ICD-10-CM | POA: Diagnosis not present

## 2021-07-23 DIAGNOSIS — E1140 Type 2 diabetes mellitus with diabetic neuropathy, unspecified: Secondary | ICD-10-CM | POA: Diagnosis not present

## 2021-07-23 DIAGNOSIS — I482 Chronic atrial fibrillation, unspecified: Secondary | ICD-10-CM | POA: Diagnosis not present

## 2021-07-23 DIAGNOSIS — K7581 Nonalcoholic steatohepatitis (NASH): Secondary | ICD-10-CM | POA: Diagnosis not present

## 2021-07-23 DIAGNOSIS — G894 Chronic pain syndrome: Secondary | ICD-10-CM | POA: Diagnosis not present

## 2021-07-23 DIAGNOSIS — I10 Essential (primary) hypertension: Secondary | ICD-10-CM | POA: Diagnosis not present

## 2021-07-28 ENCOUNTER — Ambulatory Visit
Admission: RE | Admit: 2021-07-28 | Discharge: 2021-07-28 | Disposition: A | Discharge status: Home / Self Care | Payer: Medicare Other | Source: Ambulatory Visit | Attending: Family Medicine | Admitting: Family Medicine

## 2021-07-28 ENCOUNTER — Other Ambulatory Visit: Payer: Self-pay | Admitting: Family Medicine

## 2021-07-28 DIAGNOSIS — L97512 Non-pressure chronic ulcer of other part of right foot with fat layer exposed: Secondary | ICD-10-CM

## 2021-07-28 DIAGNOSIS — L97519 Non-pressure chronic ulcer of other part of right foot with unspecified severity: Secondary | ICD-10-CM | POA: Diagnosis not present

## 2021-07-28 DIAGNOSIS — Z87828 Personal history of other (healed) physical injury and trauma: Secondary | ICD-10-CM | POA: Diagnosis not present

## 2021-07-28 DIAGNOSIS — M2011 Hallux valgus (acquired), right foot: Secondary | ICD-10-CM | POA: Diagnosis not present

## 2021-07-30 ENCOUNTER — Other Ambulatory Visit: Payer: Self-pay | Admitting: Family Medicine

## 2021-07-30 DIAGNOSIS — L97512 Non-pressure chronic ulcer of other part of right foot with fat layer exposed: Secondary | ICD-10-CM

## 2021-07-31 ENCOUNTER — Ambulatory Visit (INDEPENDENT_AMBULATORY_CARE_PROVIDER_SITE_OTHER): Payer: Medicare Other | Admitting: Podiatry

## 2021-07-31 DIAGNOSIS — I739 Peripheral vascular disease, unspecified: Secondary | ICD-10-CM

## 2021-07-31 DIAGNOSIS — M86071 Acute hematogenous osteomyelitis, right ankle and foot: Secondary | ICD-10-CM | POA: Diagnosis not present

## 2021-07-31 NOTE — Patient Instructions (Addendum)
I have ordered a circulation test to check the blood flow. This will be done at [facility]  Get blood work done  Change the bandage daily. You can use the OPTIFOAM heel wound dressing

## 2021-07-31 NOTE — Progress Notes (Signed)
Subjective: Chief Complaint  Patient presents with   Foot Ulcer    Right foot heel ulcer, started 4 years ago, pt was in the hospital and heel ulcer came back, pt denies any pain    Patient states he went to the hospital about 4 years ago and he got bedsores on the heels. They did heal but over a period of time the wounds opened back up. It opened up about 2-3 months ago. He is currently on amoxicillin for life due to joint infection.   Previous X-ray: IMPRESSION: 1. Ulceration within the inferior aspect of the posterior heel soft tissues. No high-grade adjacent cortical erosion is seen however there is very subtle decreased density in the cortex closer to the soft tissue wound which raises suspicion for very early radiographic findings of possible acute osteomyelitis. 2. 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 Possible mild erosion of the base of the proximal phalanx of the great toe is nonspecific and may be degenerative.  Objective: AAO x3, NAD DP/PT pulses decreased On the posterior aspect the right heel is approximately the wound with surrounding erythema and hyperkeratotic tissue.  There is no drainage or crusting there is no fluctuation or crepitation.  There is no probing to bone No pain with calf compression, swelling, warmth, erythema     Assessment: Heel ulceration with localized erythema  Plan: -All treatment options discussed with the patient including all alternatives, risks, complications.  -Previous x-rays noted.  Ulceration inferior posterior calcaneous.  Concern for possible early osteomyelitis. -Given concern for osteomyelitis, MRI has been ordered today. -Continue antibiotics -Blood work ordered including CBC, sed rate, CRP, BMP -Allevyn heel cup for dressing changes -Dispensed heel pillow for offloading -ABI -Monitor for any clinical signs or symptoms of infection and directed to call  the office immediately should any occur or go to the ER. -Patient encouraged to call the office with any questions, concerns, change in symptoms.   40 minutes were spent with the patient and greater than 50% of the time was in face to face contact.   Trula Slade DPM

## 2021-08-03 ENCOUNTER — Ambulatory Visit
Admission: RE | Admit: 2021-08-03 | Discharge: 2021-08-03 | Disposition: A | Discharge status: Home / Self Care | Payer: Medicare Other | Source: Ambulatory Visit | Attending: Family Medicine | Admitting: Family Medicine

## 2021-08-03 DIAGNOSIS — E119 Type 2 diabetes mellitus without complications: Secondary | ICD-10-CM | POA: Diagnosis not present

## 2021-08-03 DIAGNOSIS — M86171 Other acute osteomyelitis, right ankle and foot: Secondary | ICD-10-CM | POA: Diagnosis not present

## 2021-08-03 DIAGNOSIS — L97512 Non-pressure chronic ulcer of other part of right foot with fat layer exposed: Secondary | ICD-10-CM

## 2021-08-03 DIAGNOSIS — S91301A Unspecified open wound, right foot, initial encounter: Secondary | ICD-10-CM | POA: Diagnosis not present

## 2021-08-06 ENCOUNTER — Ambulatory Visit (HOSPITAL_COMMUNITY)
Admission: RE | Admit: 2021-08-06 | Discharge: 2021-08-06 | Disposition: A | Discharge status: Home / Self Care | Payer: Medicare Other | Source: Ambulatory Visit | Attending: Vascular Surgery | Admitting: Vascular Surgery

## 2021-08-06 ENCOUNTER — Telehealth (INDEPENDENT_AMBULATORY_CARE_PROVIDER_SITE_OTHER): Payer: Medicare Other | Admitting: Internal Medicine

## 2021-08-06 ENCOUNTER — Encounter: Payer: Self-pay | Admitting: Internal Medicine

## 2021-08-06 ENCOUNTER — Other Ambulatory Visit: Payer: Self-pay | Admitting: Podiatry

## 2021-08-06 ENCOUNTER — Other Ambulatory Visit: Payer: Self-pay

## 2021-08-06 ENCOUNTER — Telehealth: Payer: Self-pay | Admitting: Podiatry

## 2021-08-06 DIAGNOSIS — I739 Peripheral vascular disease, unspecified: Secondary | ICD-10-CM | POA: Diagnosis not present

## 2021-08-06 DIAGNOSIS — M86171 Other acute osteomyelitis, right ankle and foot: Secondary | ICD-10-CM | POA: Diagnosis not present

## 2021-08-06 MED ORDER — CEFADROXIL 500 MG PO CAPS: 500.0000 mg | ORAL_CAPSULE | Freq: Two times a day (BID) | ORAL | 1 refills | Status: DC

## 2021-08-06 MED ORDER — DOXYCYCLINE HYCLATE 100 MG PO TABS: 100.0000 mg | ORAL_TABLET | Freq: Two times a day (BID) | ORAL | 1 refills | Status: DC

## 2021-08-06 NOTE — Progress Notes (Unsigned)
Subjective:    Patient ID: [name], male    DOB: 30-Jun-1935, 86 y.o.   MRN: [id number]  I connected with  [name] on 08/06/21 by a video enabled telemedicine application and verified that I am speaking with the correct person using two identifiers.   I discussed the limitations of evaluation and management by telemedicine. The patient expressed understanding and agreed to proceed.  Location: Patient - home Physician - clinic  Duration of visit:  40 minutes     [facility]      Reason for Consult:calcaneal osteomyelitis    Referring Physician: Dr. Jacqualyn Posey    Patient ID: [name], male    DOB: Apr 18, 1935, 86 y.o.   MRN: [id number]  HPI:   He is here for evaluation for calcaneal osteomyelitis  I have seen him previously for a different problem and now has had an ongoing issue with a heel ulcer that now has developed into acute osteomyelitis.  Plan per Dr. Jacqualyn Posey is to debride this and send off a bone culture to guide treatement.  He has no complaints today. 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   He is having no fever, no chills   Past Medical History:  Diagnosis Date   Acute encephalopathy 05/09/2014   Alcoholism ([place])    Arthritis    Cerebral infarction due to embolism of left middle cerebral artery (HCC)    Diabetes mellitus type 2, controlled ([place]) 05/04/2014   Essential hypertension 05/04/2014   Gout 05/04/2014   Hypertension    New onset atrial fibrillation ([place]) 05/04/2014   Psoas abscess ([place]) 05/04/2014   Psoas abscess, right (HCC)    SDH (subdural hematoma) ([place]) 05/12/2014   Sepsis ([place]) 05/04/2014   Septic arthritis of hip ([place]) 05/18/2014   Stenosis of right carotid artery    40-59 percent    Streptococcal bacteremia 05/09/2014    Prior to Admission medications   Medication Sig Start Date End Date Taking? Authorizing Provider  cefadroxil (DURICEF) 500  MG capsule Take 1 capsule (500 mg total) by mouth 2 (two) times daily. 08/15/21  Yes Dulcy Sida, Okey Regal, MD  doxycycline (VIBRA-TABS) 100 MG tablet Take 1 tablet (100 mg total) by mouth 2 (two) times daily. 08/15/21  Yes Alaira Level, Okey Regal, MD  ALPHA LIPOIC ACID PO Take 1 capsule by mouth 2 (two) times daily. For gout    [provider]  Amino Acids-Protein Hydrolys (FEEDING SUPPLEMENT, PRO-STAT SUGAR FREE 64,) LIQD Place 30 mLs into feeding tube 2 (two) times daily. 05/24/14   Rama, Venetia Maxon, MD  amoxicillin (AMOXIL) 500 MG capsule TAKE 1 CAPSULE BY MOUTH TWICE A DAY 06/23/21   Merryl Buckels, Okey Regal, MD  atenolol (TENORMIN) 50 MG tablet Take 50 mg by mouth daily. 04/20/20   [provider]  celecoxib (CELEBREX) 200 MG capsule Take 200 mg by mouth 2 (two) times daily. 02/26/14   [provider]  Cholecalciferol 25 MCG (1000 UT) capsule Take 1,000 Units by mouth daily.    [provider]  collagenase (SANTYL) ointment Apply topically daily. 07/24/14   Bonnielee Haff, MD  CRESTOR 10 MG tablet Take 10 mg by mouth daily. Takes one every other day 03/05/14   [provider]  diltiazem (CARDIZEM CD) 360 MG 24 hr capsule Take 1 capsule (360 mg total) by mouth daily. 05/24/14   Rama, Venetia Maxon, MD  feeding supplement, ENSURE ENLIVE, (ENSURE ENLIVE) LIQD Take 237 mLs by mouth 2 (two) times daily between meals. 05/24/14   Rama, Venetia Maxon, MD  fluticasone Asencion Islam) 50 MCG/ACT nasal spray SMARTSIG:2 Spray(s) Both Nares Daily PRN 04/20/20   [provider]  folic acid (FOLVITE) 1 MG tablet Take 1 tablet (1 mg total) by mouth daily. 05/24/14   Rama, Venetia Maxon, MD  furosemide (LASIX) 40 MG tablet Take 1 tablet (40 mg total) by mouth daily. 07/24/14   Bonnielee Haff, MD  hydrALAZINE (APRESOLINE) 25 MG tablet Take 1 tablet (25 mg total) by mouth every 8 (eight) hours. 05/24/14   Rama, Venetia Maxon, MD  irbesartan (AVAPRO) 75 MG tablet Take 75 mg by mouth daily. 03/31/20    [provider]  LYRICA 75 MG capsule Take 75-150 mg by mouth 2 (two) times daily. '75mg'$  in the morning and '150mg'$  at night 04/27/14   [provider]  metFORMIN (GLUCOPHAGE) 1000 MG tablet Take 1,000 mg by mouth daily with breakfast.  07/16/14   [provider]  Multiple Vitamin (MULTIVITAMIN WITH MINERALS) TABS tablet Take 1 tablet by mouth daily. 05/24/14   Rama, Venetia Maxon, MD  polyethylene glycol (MIRALAX / GLYCOLAX) packet Take 17 g by mouth daily. Patient not taking: Reported on 02/18/2017 05/24/14   Rama, Venetia Maxon, MD  potassium chloride SA (K-DUR,KLOR-CON) 20 MEQ tablet Take 1 tablet (20 mEq total) by mouth daily. 07/24/14   Bonnielee Haff, MD  saccharomyces boulardii (FLORASTOR) 250 MG capsule Take 1 capsule (250 mg total) by mouth 2 (two) times daily. 05/24/14   Rama, Venetia Maxon, MD  thiamine 100 MG tablet Take 1 tablet (100 mg total) by mouth daily. 05/24/14   Rama, Venetia Maxon, MD  traMADol (ULTRAM) 50 MG tablet Take 2 tablets (100 mg total) by mouth every 6 (six) hours as needed. Every 4 hours 05/24/14   Rama, Venetia Maxon, MD  ULORIC 80 MG TABS Take 1 tablet by mouth daily. 02/22/14   [provider]  vitamin C (ASCORBIC ACID) 500 MG tablet Take 500 mg by mouth every other day.    [provider]  XARELTO 20 MG TABS tablet Take 20 mg by mouth every morning. 07/16/14   [provider]    Allergies  Allergen Reactions   Allopurinol Other (See Comments)    Makes head 'feel funny'   Atorvastatin Other (See Comments)   Motrin [Ibuprofen] Other (See Comments)    Blisters in groin area    Social History   Tobacco Use   Smoking status: Former    Packs/day: 25.00    Types: Cigarettes   Smokeless tobacco: Never  Substance Use Topics   Alcohol use: Yes    Comment: 1-2 beers daily   Drug use: No    Family History  Problem Relation Age of Onset   Diabetes Mellitus II Daughter      Review of Systems  Constitutional: negative  for fevers and chills All other systems reviewed and are negative    Constitutional: in no apparent distress There were no vitals filed for this visit.   Labs: Lab Results  Component Value Date   WBC 6.4 07/24/2014   HGB 11.1 (L) 07/24/2014   HCT 33.4 (L) 07/24/2014   MCV 92.3 07/24/2014   PLT 240 07/24/2014    Lab Results  Component Value Date   CREATININE 0.59 (L) 07/24/2014   BUN 12 07/24/2014   NA 137 07/24/2014   K 3.5 07/24/2014  CL 105 07/24/2014   CO2 23 07/24/2014    Lab Results  Component Value Date   ALT 12 (L) 07/22/2014   AST 22 07/22/2014   ALKPHOS 61 07/22/2014   BILITOT 0.5 07/22/2014   INR 1.95 (H) 07/22/2014     Assessment/Plan: calcaneal osteomyelitis.  I discussed with him the infection and difficulty in treatment for calcaneal ostoemyelitis with a poor success rate with antibiotics.  This is particularly true with any issues with blood flow.  Regardless, will see over time how he does and plan to start doxycycline and cefadroxil after his surgical debridement and will taper antibiotics based on any culture growth.  Will plan on 6-8 weeks of treatment.  He will follow up with me in about 3 weeks.      Objective:        Assessment & Plan:

## 2021-08-06 NOTE — Telephone Encounter (Signed)
Nothing has came through fax today for this patient and nothing has been put in Dr. Leigh Aurora folder today for this patient as well. I am going to call them and see what fax number they sent it to and just have them re-fax it again.

## 2021-08-06 NOTE — Telephone Encounter (Signed)
Left a detailed message for [name] to re-fax over the MRI results to [place] at [phone]

## 2021-08-06 NOTE — Telephone Encounter (Signed)
[place] with [facility] faxed over MRI today. She wanted to be sure Dr. Jacqualyn Posey received it for his appointment because it shows early signs of osteomyelitis.  Please advise

## 2021-08-07 ENCOUNTER — Ambulatory Visit: Payer: Medicare Other | Admitting: Podiatry

## 2021-08-07 ENCOUNTER — Encounter: Payer: Self-pay | Admitting: Internal Medicine

## 2021-08-07 DIAGNOSIS — I739 Peripheral vascular disease, unspecified: Secondary | ICD-10-CM | POA: Diagnosis not present

## 2021-08-07 DIAGNOSIS — M86071 Acute hematogenous osteomyelitis, right ankle and foot: Secondary | ICD-10-CM | POA: Diagnosis not present

## 2021-08-07 DIAGNOSIS — M86171 Other acute osteomyelitis, right ankle and foot: Secondary | ICD-10-CM | POA: Insufficient documentation

## 2021-08-07 NOTE — Patient Instructions (Signed)

## 2021-08-08 ENCOUNTER — Telehealth: Payer: Self-pay | Admitting: Urology

## 2021-08-08 NOTE — Telephone Encounter (Signed)
DOS - 08/13/21  BONE BIOPSY RIGHT --- 20220 ULCER DEBRIDEMENT  RIGHT --- 11042  [employer]HC EFFECTIVE DATE - 01/05/21  PLAN DEDUCTIBLE - $0.00 OUT OF POCKET - $3,600.00 W/ $3,585.00 REMAINING COINSURANCE - 0% COPAY - $295.00  PER [employer] WEBSITE FOR CPT CODES 98264 AND 15830 Notification or Prior Authorization is not required for the requested services  Decision ID #:[id number]

## 2021-08-08 NOTE — Progress Notes (Signed)
Surgery orders requested via Epic inbox. °

## 2021-08-11 ENCOUNTER — Encounter (HOSPITAL_COMMUNITY)
Admission: RE | Admit: 2021-08-11 | Discharge: 2021-08-11 | Disposition: A | Discharge status: Home / Self Care | Payer: Medicare Other | Source: Ambulatory Visit | Attending: Podiatry | Admitting: Podiatry

## 2021-08-11 ENCOUNTER — Encounter (HOSPITAL_COMMUNITY): Payer: Self-pay

## 2021-08-11 VITALS — BP 158/95 | HR 84 | Temp 98.1°F | Resp 14 | Ht 69.0 in | Wt 198.0 lb

## 2021-08-11 DIAGNOSIS — I4891 Unspecified atrial fibrillation: Secondary | ICD-10-CM | POA: Insufficient documentation

## 2021-08-11 DIAGNOSIS — M86071 Acute hematogenous osteomyelitis, right ankle and foot: Secondary | ICD-10-CM | POA: Insufficient documentation

## 2021-08-11 DIAGNOSIS — I10 Essential (primary) hypertension: Secondary | ICD-10-CM | POA: Insufficient documentation

## 2021-08-11 DIAGNOSIS — I6529 Occlusion and stenosis of unspecified carotid artery: Secondary | ICD-10-CM | POA: Insufficient documentation

## 2021-08-11 DIAGNOSIS — Z01818 Encounter for other preprocedural examination: Secondary | ICD-10-CM | POA: Diagnosis not present

## 2021-08-11 DIAGNOSIS — E118 Type 2 diabetes mellitus with unspecified complications: Secondary | ICD-10-CM | POA: Insufficient documentation

## 2021-08-11 DIAGNOSIS — Z7984 Long term (current) use of oral hypoglycemic drugs: Secondary | ICD-10-CM | POA: Diagnosis not present

## 2021-08-11 DIAGNOSIS — Z8673 Personal history of transient ischemic attack (TIA), and cerebral infarction without residual deficits: Secondary | ICD-10-CM | POA: Insufficient documentation

## 2021-08-11 LAB — BASIC METABOLIC PANEL
Anion gap: 8 (ref 5–15)
BUN: 18 mg/dL (ref 8–23)
CO2: 22 mmol/L (ref 22–32)
Calcium: 10 mg/dL (ref 8.9–10.3)
Chloride: 104 mmol/L (ref 98–111)
Creatinine, Ser: 0.76 mg/dL (ref 0.61–1.24)
GFR, Estimated: >60 mL/min (ref >60)
Glucose, Bld: 111 mg/dL — ABNORMAL HIGH (ref 70–99)
Potassium: 5.1 mmol/L (ref 3.5–5.1)
Sodium: 134 mmol/L — ABNORMAL LOW (ref 135–145)

## 2021-08-11 LAB — CBC
HCT: 39.9 % (ref 39.0–52.0)
Hemoglobin: 13.1 g/dL (ref 13.0–17.0)
MCH: 30.5 pg (ref 26.0–34.0)
MCHC: 32.8 g/dL (ref 30.0–36.0)
MCV: 93 fL (ref 80.0–100.0)
Platelets: 279 10*3/uL (ref 150–400)
RBC: 4.29 MIL/uL (ref 4.22–5.81)
RDW: 14.9 % (ref 11.5–15.5)
WBC: 6.5 10*3/uL (ref 4.0–10.5)
nRBC: 0 % (ref 0.0–0.2)

## 2021-08-11 LAB — GLUCOSE, CAPILLARY: Glucose-Capillary: 125 mg/dL — ABNORMAL HIGH (ref 70–99)

## 2021-08-11 LAB — HEMOGLOBIN A1C
Hgb A1c MFr Bld: 5.8 % — ABNORMAL HIGH (ref 4.8–5.6)
Mean Plasma Glucose: 119.76 mg/dL

## 2021-08-11 NOTE — Patient Instructions (Addendum)
SURGICAL WAITING ROOM VISITATION Patients having surgery or a procedure may have no more than 2 support people in the waiting area - these visitors may rotate.   Children under the age of 73 must have an adult with them who is not the patient. If the patient needs to stay at the hospital during part of their recovery, the visitor guidelines for inpatient rooms apply. Pre-op nurse will coordinate an appropriate time for 1 support person to accompany patient in pre-op.  This support person may not rotate.    Please refer to the [facility] website for the visitor guidelines for Inpatients (after your surgery is over and you are in a regular room).    Your procedure is scheduled on: 08/13/21   Report to [facility] Main Entrance    Report to admitting at 11:30 AM   Call this number if you have problems the morning of surgery [phone]   Do not eat food :After Midnight.   After Midnight you may have the following liquids until 10:45 AM DAY OF SURGERY  Water Non-Citrus Juices (without pulp, NO RED) Carbonated Beverages Black Coffee (NO MILK/CREAM OR CREAMERS, sugar ok)  Clear Tea (NO MILK/CREAM OR CREAMERS, sugar ok) regular and decaf                             Plain Jell-O (NO RED)                                           Fruit ices (not with fruit pulp, NO RED)                                     Popsicles (NO RED)                                                               Sports drinks like Gatorade (NO RED)    FOLLOW BOWEL PREP AND ANY ADDITIONAL PRE OP INSTRUCTIONS YOU RECEIVED FROM YOUR SURGEON'S OFFICE!!!     Oral Hygiene is also important to reduce your risk of infection.                                    Remember - BRUSH YOUR TEETH THE MORNING OF SURGERY WITH YOUR REGULAR TOOTHPASTE   Take these medicines the morning of surgery with A SIP OF WATER: Amoxicillin, Atenolol, Crestor, Lyrica, Tramadol   DO NOT TAKE ANY ORAL DIABETIC MEDICATIONS DAY OF YOUR  SURGERY  How to Manage Your Diabetes Before and After Surgery  Why is it important to control my blood sugar before and after surgery? Improving blood sugar levels before and after surgery helps healing and can limit problems. A way of improving blood sugar control is eating a healthy diet by:  Eating less sugar and carbohydrates  Increasing activity/exercise  Talking with your doctor about reaching your blood sugar goals High blood sugars (greater than 180 mg/dL) can raise your risk of infections and  slow your recovery, so you will need to focus on controlling your diabetes during the weeks before surgery. Make sure that the doctor who takes care of your diabetes knows about your planned surgery including the date and location.  How do I manage my blood sugar before surgery? Check your blood sugar at least 4 times a day, starting 2 days before surgery, to make sure that the level is not too high or low. Check your blood sugar the morning of your surgery when you wake up and every 2 hours until you get to the Short Stay unit. If your blood sugar is less than 70 mg/dL, you will need to treat for low blood sugar: Do not take insulin. Treat a low blood sugar (less than 70 mg/dL) with  cup of clear juice (cranberry or apple), 4 glucose tablets, OR glucose gel. Recheck blood sugar in 15 minutes after treatment (to make sure it is greater than 70 mg/dL). If your blood sugar is not greater than 70 mg/dL on recheck, call [phone] for further instructions. Report your blood sugar to the short stay nurse when you get to Short Stay.  If you are admitted to the hospital after surgery: Your blood sugar will be checked by the staff and you will probably be given insulin after surgery (instead of oral diabetes medicines) to make sure you have good blood sugar levels. The goal for blood sugar control after surgery is 80-180 mg/dL.   WHAT DO I DO ABOUT MY DIABETES MEDICATION?  Do not take oral  diabetes medicines (pills) the morning of surgery.  THE DAY BEFORE SURGERY, take Metformin as prescribed.     THE MORNING OF SURGERY, do not take Metformin.  The day of surgery, do not take other diabetes injectables, including Byetta (exenatide), Bydureon (exenatide ER), Victoza (liraglutide), or Trulicity (dulaglutide).  Reviewed and Endorsed by [facility] Patient Education Committee, August 2015                               You may not have any metal on your body including jewelry, and body piercing             Do not wear lotions, powders, cologne, or deodorant              Men may shave face and neck.   Do not bring valuables to the hospital. [place].   Contacts, dentures or bridgework may not be worn into surgery.  DO NOT [place]. PHARMACY WILL DISPENSE MEDICATIONS LISTED ON YOUR MEDICATION LIST TO YOU DURING YOUR ADMISSION [place]!    Patients discharged on the day of surgery will not be allowed to drive home.  Someone NEEDS to stay with you for the first 24 hours after anesthesia.   Special Instructions: Bring a copy of your healthcare power of attorney and living will documents         the day of surgery if you haven't scanned them before.              Please read over the following fact sheets you were given: IF YOU HAVE QUESTIONS ABOUT YOUR PRE-OP INSTRUCTIONS PLEASE CALL [facility] - Preparing for Surgery Before surgery, you can play an important role.  Because  skin is not sterile, your skin needs to be as free of germs as possible.  You can reduce the number of germs on your skin by washing with CHG (chlorahexidine gluconate) soap before surgery.  CHG is an antiseptic cleaner which kills germs and bonds with the skin to continue killing germs even after washing. Please DO NOT use if you have an allergy to CHG or antibacterial soaps.  If your skin  becomes reddened/irritated stop using the CHG and inform your nurse when you arrive at Short Stay. Do not shave (including legs and underarms) for at least 48 hours prior to the first CHG shower.  You may shave your face/neck.  Please follow these instructions carefully:  1.  Shower with CHG Soap the night before surgery and the  morning of surgery.  2.  If you choose to wash your hair, wash your hair first as usual with your normal  shampoo.  3.  After you shampoo, rinse your hair and body thoroughly to remove the shampoo.                             4.  Use CHG as you would any other liquid soap.  You can apply chg directly to the skin and wash.  Gently with a scrungie or clean washcloth.  5.  Apply the CHG Soap to your body ONLY FROM THE NECK DOWN.   Do   not use on face/ open                           Wound or open sores. Avoid contact with eyes, ears mouth and   genitals (private parts).                       Wash face,  Genitals (private parts) with your normal soap.             6.  Wash thoroughly, paying special attention to the area where your    surgery  will be performed.  7.  Thoroughly rinse your body with warm water from the neck down.  8.  DO NOT shower/wash with your normal soap after using and rinsing off the CHG Soap.                9.  Pat yourself dry with a clean towel.            10.  Wear clean pajamas.            11.  Place clean sheets on your bed the night of your first shower and do not  sleep with pets. Day of Surgery : Do not apply any lotions/deodorants the morning of surgery.  Please wear clean clothes to the hospital/surgery center.  FAILURE TO FOLLOW THESE INSTRUCTIONS MAY RESULT IN THE CANCELLATION OF YOUR SURGERY  PATIENT SIGNATURE_________________________________  NURSE SIGNATURE__________________________________  ________________________________________________________________________

## 2021-08-11 NOTE — Progress Notes (Addendum)
COVID Vaccine Completed: yes x2  Date of COVID positive in last 90 days: no  PCP - Dibas Koirala, MD Cardiologist - n/a  Chest x-ray - n/a EKG - 08/11/21 Epic/chart Stress Test - n/a ECHO - 2016 Cardiac Cath - n/a Pacemaker/ICD device last checked: n/a Spinal Cord Stimulator: n/a  Bowel Prep - no  Sleep Study - n/a CPAP -   Fasting Blood Sugar - no checks at home Checks Blood Sugar  times a day  Blood Thinner Instructions: Aspirin Instructions: ASA 81, no instructions per pt Last Dose:  Activity level: Can perform activities of daily living without stopping and without symptoms of chest pain or shortness of breath. No stairs due to mobility issues   Anesthesia review: HTN, DM2, SDH (2016)  Patient denies shortness of breath, fever, cough and chest pain at PAT appointment  Patient verbalized understanding of instructions that were given to them at the PAT appointment. Patient was also instructed that they will need to review over the PAT instructions again at home before surgery.

## 2021-08-12 NOTE — Progress Notes (Signed)
Anesthesia Chart Review:   Case: [id number] Date/Time: 08/13/21 1330   Procedures:      BONE BIOPSY (Right)     DEBRIDEMENT WOUND (Right)   Anesthesia type: Choice   Pre-op diagnosis: ulcer   Location: [facility] / [facility] OR[facility]   Surgeons: Trula Slade, DPM       DISCUSSION: Pt is 86 years old with hx atrial fibrillation, HTN, carotid stenosis, DM, alcohol abuse, CVA, subdural hematoma  Pt hospitalized 2016 for sepsis due to iliopsoas abscess. Complicated by new onset atrial fib, acute encephalopathy, and subdural hematoma/CVA. Noted he was considered a candidate for anticoagulation at that time. PCP office visit notes document pt declined anticoagulation and PCP supported it due to risk of falls.   H&P from PCP is on paper chart.    VS: BP (!) 158/95   Pulse 84   Temp 36.7 C (Oral)   Resp 14   Ht '5\' 9"'$  (1.753 m)   Wt 89.8 kg   SpO2 98%   BMI 29.24 kg/m   PROVIDERS: - PCP is Koirala, Dibas, MD   LABS: Labs reviewed: Acceptable for surgery. (all labs ordered are listed, but only abnormal results are displayed)  Labs Reviewed  HEMOGLOBIN A1C - Abnormal; Notable for the following components:      Result Value   Hgb A1c MFr Bld 5.8 (*)    All other components within normal limits  BASIC METABOLIC PANEL - Abnormal; Notable for the following components:   Sodium 134 (*)    Glucose, Bld 111 (*)    All other components within normal limits  GLUCOSE, CAPILLARY - Abnormal; Notable for the following components:   Glucose-Capillary 125 (*)    All other components within normal limits  CBC     EKG 08/11/21: Atrial fibrillation 78 bpm. Left axis deviation. Possible Anterior infarct , age undetermined   CV: Carotid US 05/12/14:  - Right: 40-59% ICA stenosis. Bilateral: Vertebral artery flow is antegrade. - Left: ICA not adequately evaluated because the patient became agitated.  Echo 05/06/14:  - Left ventricle: The cavity size was normal. Wall thickness was increased in a  pattern of severe LVH. Systolic function was normal. The estimated ejection fraction was in the range of 60% to 65%. Wall motion was normal; there were no regional wall motion abnormalities. The study is not technically sufficient to allow evaluation of LV diastolic function.  - Aortic valve: Sclerosis without stenosis. There was trivial regurgitation.  - Mitral valve: Mildly thickened leaflets . There was moderate regurgitation.  - Left atrium: The atrium was at the upper limits of normal in size.  - Right ventricle: The cavity size was moderately dilated.  - Right atrium: The atrium was mildly dilated.  - Tricuspid valve: There was moderate regurgitation.  - Pulmonary arteries: PA peak pressure: 41 mm Hg (S).  - Inferior vena cava: The vessel was dilated. The respirophasic diameter changes were blunted (< 50%), consistent with elevated central venous pressure   Past Medical History:  Diagnosis Date   Acute encephalopathy 05/09/2014   Alcoholism ([place])    Arthritis    Cerebral infarction due to embolism of left middle cerebral artery (HCC)    Diabetes mellitus type 2, controlled ([place]) 05/04/2014   Essential hypertension 05/04/2014   Gout 05/04/2014   Hypertension    New onset atrial fibrillation ([place]) 05/04/2014   Psoas abscess ([place]) 05/04/2014   Psoas abscess, right (HCC)    SDH (subdural hematoma) ([place]) 05/12/2014  Sepsis ([place]) 05/04/2014   Septic arthritis of hip ([place]) 05/18/2014   Stenosis of right carotid artery    40-59 percent    Streptococcal bacteremia 05/09/2014    Past Surgical History:  Procedure Laterality Date   HIP ARTHROPLASTY Right    JOINT REPLACEMENT     right hip, bilateral knees   left femur surgery Left    with rod   right cataract extraction     TONSILLECTOMY     TOTAL SHOULDER ARTHROPLASTY Bilateral     MEDICATIONS:  acidophilus (RISAQUAD) CAPS capsule   ALPHA LIPOIC ACID PO   Amino Acids-Protein Hydrolys (FEEDING SUPPLEMENT, PRO-STAT SUGAR FREE 64,) LIQD    amoxicillin (AMOXIL) 500 MG capsule   aspirin EC 81 MG tablet   atenolol (TENORMIN) 50 MG tablet   [START ON 08/15/2021] cefadroxil (DURICEF) 500 MG capsule   Cholecalciferol 25 MCG (1000 UT) capsule   collagenase (SANTYL) ointment   CRESTOR 10 MG tablet   diltiazem (CARDIZEM CD) 360 MG 24 hr capsule   [START ON 08/15/2021] doxycycline (VIBRA-TABS) 100 MG tablet   feeding supplement, ENSURE ENLIVE, (ENSURE ENLIVE) LIQD   fluticasone (FLONASE) 50 MCG/ACT nasal spray   folic acid (FOLVITE) 1 MG tablet   furosemide (LASIX) 20 MG tablet   furosemide (LASIX) 40 MG tablet   hydrALAZINE (APRESOLINE) 25 MG tablet   irbesartan (AVAPRO) 75 MG tablet   LYRICA 75 MG capsule   meloxicam (MOBIC) 7.5 MG tablet   metFORMIN (GLUCOPHAGE) 1000 MG tablet   Multiple Vitamin (MULTIVITAMIN WITH MINERALS) TABS tablet   polyethylene glycol (MIRALAX / GLYCOLAX) packet   polyvinyl alcohol (LIQUIFILM TEARS) 1.4 % ophthalmic solution   potassium chloride SA (K-DUR,KLOR-CON) 20 MEQ tablet   protein supplement shake (PREMIER PROTEIN) LIQD   saccharomyces boulardii (FLORASTOR) 250 MG capsule   thiamine 100 MG tablet   traMADol (ULTRAM) 50 MG tablet   ULORIC 80 MG TABS   No current facility-administered medications for this encounter.    If no changes, I anticipate pt can proceed with surgery as scheduled.   Willeen Cass, PhD, FNP-BC [facility] Short Stay Surgical Center/Anesthesiology Phone: [phone] 08/12/2021 9:34 AM

## 2021-08-12 NOTE — Progress Notes (Signed)
Subjective: Chief Complaint  Patient presents with   Diabetic Ulcer     diabetic heel ulcer/wound  X-Rays done 7-30- 2023  Doing the same [place]: wound center on the 9th, Seen by Vein and Vas     86 year old male presents the office today for follow-up evaluation of heel wound.  He is having MRI performed as well which was concerning for osteomyelitis.  He presents today for follow-up.  ABI performed.  This was abnormal and he was referred to vascular surgery.  Previous X-ray: IMPRESSION: 1. Ulceration within the inferior aspect of the posterior heel soft tissues. No high-grade adjacent cortical erosion is seen however there is very subtle decreased density in the cortex closer to the soft tissue wound which raises suspicion for very early radiographic findings of possible acute osteomyelitis. 2. 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 Possible mild erosion of the base of the proximal phalanx of the great toe is nonspecific and may be degenerative.  Objective: AAO x3, NAD DP/PT pulses decreased On the posterior aspect the right heel is approximately the wound with surrounding erythema and hyperkeratotic tissue.  There is no probing to bone.  Wound appears to be about the same.  There is no fluctuation or crepitation. No pain with calf compression, swelling, warmth, erythema    Assessment: Heel ulceration with localized erythema  Plan: -All treatment options discussed with the patient including all alternatives, risks, complications.  -MRI reviewed which shows early acute osteomyelitis of posterior calcaneus.  Also concern for hematoma but not likely abscess. -Given MRI findings I recommend bone biopsy, debridement.  We will plan on doing this next week. -The incision placement as well as the postoperative course was discussed with the patient. I discussed risks of the surgery which include, but not limited to,  infection, bleeding, pain, swelling, need for further surgery, delayed or nonhealing, painful or ugly scar, numbness or sensation changes, over/under correction, recurrence, transfer lesions, further deformity, DVT/PE, loss of toe/foot. Patient understands these risks and wishes to proceed with surgery. The surgical consent was reviewed with the patient all 3 pages were signed. No promises or guarantees were given to the outcome of the procedure. All questions were answered to the best of my ability. Before the surgery the patient was encouraged to call the office if there is any further questions. The surgery will be performed at [facility] along hospital on an outpatient basis. -ABI abnormal.  Awaiting vascular surgery referral. -Follow-up with infectious disease pending cultures. -Allevyn heel cup for dressing changes -Dispensed heel pillow for offloading -Monitor for any clinical signs or symptoms of infection and directed to call the office immediately should any occur or go to the ER. -Patient encouraged to call the office with any questions, concerns, change in symptoms.   35 minutes were spent with the patient and greater than 50% of the time was in face to face contact.   Trula Slade DPM

## 2021-08-12 NOTE — Anesthesia Preprocedure Evaluation (Signed)
Anesthesia Evaluation  Patient identified by MRN, date of birth, ID band Patient awake    Reviewed: Allergy & Precautions, NPO status , Patient's Chart, lab work & pertinent test results, reviewed documented beta blocker date and time   Airway Mallampati: II  TM Distance: >3 FB Neck ROM: Full    Dental  (+) Dental Advisory Given, Upper Dentures, Edentulous Lower   Pulmonary neg pulmonary ROS, former smoker,    Pulmonary exam normal breath sounds clear to auscultation       Cardiovascular hypertension, Pt. on home beta blockers and Pt. on medications + Peripheral Vascular Disease  Normal cardiovascular exam+ dysrhythmias Atrial Fibrillation + Valvular Problems/Murmurs MR  Rhythm:Regular Rate:Normal     Neuro/Psych CVA    GI/Hepatic negative GI ROS, (+)     substance abuse  alcohol use,   Endo/Other  diabetes, Type 2, Oral Hypoglycemic Agents  Renal/GU negative Renal ROS     Musculoskeletal negative musculoskeletal ROS (+)   Abdominal   Peds  Hematology negative hematology ROS (+)   Anesthesia Other Findings Day of surgery medications reviewed with the patient.  Reproductive/Obstetrics                           Anesthesia Physical Anesthesia Plan  ASA: 3  Anesthesia Plan: General   Post-op Pain Management: Tylenol PO (pre-op)*   Induction: Intravenous  PONV Risk Score and Plan: 2 and Dexamethasone and Ondansetron  Airway Management Planned: LMA and Oral ETT  Additional Equipment:   Intra-op Plan:   Post-operative Plan: Extubation in OR  Informed Consent: I have reviewed the patients History and Physical, chart, labs and discussed the procedure including the risks, benefits and alternatives for the proposed anesthesia with the patient or authorized representative who has indicated his/her understanding and acceptance.     Dental advisory given  Plan Discussed with:  CRNA  Anesthesia Plan Comments: (See APP note by Durel Salts, FNP )      Anesthesia Quick Evaluation

## 2021-08-12 NOTE — Progress Notes (Signed)
Called Dr. Leigh Aurora office and left voicemail requesting H&P for surgery.

## 2021-08-12 NOTE — H&P (View-Only) (Signed)
Subjective: Chief Complaint  Patient presents with   Diabetic Ulcer     diabetic heel ulcer/wound  X-Rays done 7-30- 2023  Doing the same [place]: wound center on the 9th, Seen by Vein and Vas     86 year old male presents the office today for follow-up evaluation of heel wound.  He is having MRI performed as well which was concerning for osteomyelitis.  He presents today for follow-up.  ABI performed.  This was abnormal and he was referred to vascular surgery.  Previous X-ray: IMPRESSION: 1. Ulceration within the inferior aspect of the posterior heel soft tissues. No high-grade adjacent cortical erosion is seen however there is very subtle decreased density in the cortex closer to the soft tissue wound which raises suspicion for very early radiographic findings of possible acute osteomyelitis. 2. 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 Possible mild erosion of the base of the proximal phalanx of the great toe is nonspecific and may be degenerative.  Objective: AAO x3, NAD DP/PT pulses decreased On the posterior aspect the right heel is approximately the wound with surrounding erythema and hyperkeratotic tissue.  There is no probing to bone.  Wound appears to be about the same.  There is no fluctuation or crepitation. No pain with calf compression, swelling, warmth, erythema    Assessment: Heel ulceration with localized erythema  Plan: -All treatment options discussed with the patient including all alternatives, risks, complications.  -MRI reviewed which shows early acute osteomyelitis of posterior calcaneus.  Also concern for hematoma but not likely abscess. -Given MRI findings I recommend bone biopsy, debridement.  We will plan on doing this next week. -The incision placement as well as the postoperative course was discussed with the patient. I discussed risks of the surgery which include, but not limited to,  infection, bleeding, pain, swelling, need for further surgery, delayed or nonhealing, painful or ugly scar, numbness or sensation changes, over/under correction, recurrence, transfer lesions, further deformity, DVT/PE, loss of toe/foot. Patient understands these risks and wishes to proceed with surgery. The surgical consent was reviewed with the patient all 3 pages were signed. No promises or guarantees were given to the outcome of the procedure. All questions were answered to the best of my ability. Before the surgery the patient was encouraged to call the office if there is any further questions. The surgery will be performed at [facility] along hospital on an outpatient basis. -ABI abnormal.  Awaiting vascular surgery referral. -Follow-up with infectious disease pending cultures. -Allevyn heel cup for dressing changes -Dispensed heel pillow for offloading -Monitor for any clinical signs or symptoms of infection and directed to call the office immediately should any occur or go to the ER. -Patient encouraged to call the office with any questions, concerns, change in symptoms.   35 minutes were spent with the patient and greater than 50% of the time was in face to face contact.   Trula Slade DPM

## 2021-08-13 ENCOUNTER — Encounter (HOSPITAL_COMMUNITY): Payer: Self-pay | Admitting: Podiatry

## 2021-08-13 ENCOUNTER — Ambulatory Visit (HOSPITAL_COMMUNITY): Payer: Medicare Other | Admitting: Emergency Medicine

## 2021-08-13 ENCOUNTER — Ambulatory Visit (HOSPITAL_COMMUNITY)
Admission: RE | Admit: 2021-08-13 | Discharge: 2021-08-13 | Disposition: A | Discharge status: Home / Self Care | Payer: Medicare Other | Source: Ambulatory Visit | Attending: Podiatry | Admitting: Podiatry

## 2021-08-13 ENCOUNTER — Ambulatory Visit (HOSPITAL_COMMUNITY): Payer: Medicare Other

## 2021-08-13 ENCOUNTER — Encounter (HOSPITAL_COMMUNITY)
Admission: RE | Disposition: A | Discharge status: Home / Self Care | Payer: Self-pay | Source: Ambulatory Visit | Attending: Podiatry

## 2021-08-13 ENCOUNTER — Encounter (HOSPITAL_BASED_OUTPATIENT_CLINIC_OR_DEPARTMENT_OTHER): Payer: Medicare Other | Admitting: Physician Assistant

## 2021-08-13 ENCOUNTER — Other Ambulatory Visit: Payer: Self-pay

## 2021-08-13 ENCOUNTER — Ambulatory Visit (HOSPITAL_BASED_OUTPATIENT_CLINIC_OR_DEPARTMENT_OTHER): Payer: Medicare Other | Admitting: Anesthesiology

## 2021-08-13 DIAGNOSIS — Z7984 Long term (current) use of oral hypoglycemic drugs: Secondary | ICD-10-CM | POA: Diagnosis not present

## 2021-08-13 DIAGNOSIS — Z8673 Personal history of transient ischemic attack (TIA), and cerebral infarction without residual deficits: Secondary | ICD-10-CM | POA: Insufficient documentation

## 2021-08-13 DIAGNOSIS — Z87891 Personal history of nicotine dependence: Secondary | ICD-10-CM

## 2021-08-13 DIAGNOSIS — I4891 Unspecified atrial fibrillation: Secondary | ICD-10-CM | POA: Insufficient documentation

## 2021-08-13 DIAGNOSIS — S91301A Unspecified open wound, right foot, initial encounter: Secondary | ICD-10-CM | POA: Diagnosis not present

## 2021-08-13 DIAGNOSIS — M86071 Acute hematogenous osteomyelitis, right ankle and foot: Secondary | ICD-10-CM | POA: Diagnosis not present

## 2021-08-13 DIAGNOSIS — E11621 Type 2 diabetes mellitus with foot ulcer: Secondary | ICD-10-CM

## 2021-08-13 DIAGNOSIS — E118 Type 2 diabetes mellitus with unspecified complications: Secondary | ICD-10-CM

## 2021-08-13 DIAGNOSIS — L97519 Non-pressure chronic ulcer of other part of right foot with unspecified severity: Secondary | ICD-10-CM

## 2021-08-13 DIAGNOSIS — I10 Essential (primary) hypertension: Secondary | ICD-10-CM | POA: Insufficient documentation

## 2021-08-13 DIAGNOSIS — E1151 Type 2 diabetes mellitus with diabetic peripheral angiopathy without gangrene: Secondary | ICD-10-CM | POA: Insufficient documentation

## 2021-08-13 DIAGNOSIS — M86179 Other acute osteomyelitis, unspecified ankle and foot: Secondary | ICD-10-CM | POA: Diagnosis not present

## 2021-08-13 DIAGNOSIS — L97419 Non-pressure chronic ulcer of right heel and midfoot with unspecified severity: Secondary | ICD-10-CM | POA: Diagnosis not present

## 2021-08-13 HISTORY — PX: BONE BIOPSY: SHX375

## 2021-08-13 HISTORY — PX: WOUND DEBRIDEMENT: SHX247

## 2021-08-13 LAB — GLUCOSE, CAPILLARY
Glucose-Capillary: 125 mg/dL — ABNORMAL HIGH (ref 70–99)
Glucose-Capillary: 117 mg/dL — ABNORMAL HIGH (ref 70–99)

## 2021-08-13 SURGERY — BIOPSY, BONE
Anesthesia: General | Laterality: Right

## 2021-08-13 MED ORDER — PROPOFOL 10 MG/ML IV BOLUS
INTRAVENOUS | Status: DC | PRN
  Administered 2021-08-13: 120 mg via INTRAVENOUS

## 2021-08-13 MED ORDER — ACETAMINOPHEN 500 MG PO TABS
ORAL_TABLET | ORAL | Status: AC
  Administered 2021-08-13: 1000 mg
  Filled 2021-08-13: qty 2

## 2021-08-13 MED ORDER — HYDRALAZINE HCL 20 MG/ML IJ SOLN: 10.0000 mg | Freq: Once | INTRAMUSCULAR | Status: AC

## 2021-08-13 MED ORDER — LIDOCAINE HCL (PF) 1 % IJ SOLN
INTRAMUSCULAR | Status: AC
  Filled 2021-08-13: qty 30

## 2021-08-13 MED ORDER — CHLORHEXIDINE GLUCONATE 0.12 % MT SOLN
15.0000 mL | Freq: Once | OROMUCOSAL | Status: AC
  Administered 2021-08-13: 15 mL via OROMUCOSAL

## 2021-08-13 MED ORDER — ONDANSETRON HCL 4 MG/2ML IJ SOLN
INTRAMUSCULAR | Status: DC | PRN
  Administered 2021-08-13: 4 mg via INTRAVENOUS

## 2021-08-13 MED ORDER — BUPIVACAINE HCL (PF) 0.5 % IJ SOLN
INTRAMUSCULAR | Status: DC | PRN
  Administered 2021-08-13: 5 mL

## 2021-08-13 MED ORDER — BUPIVACAINE HCL (PF) 0.5 % IJ SOLN
INTRAMUSCULAR | Status: AC
  Filled 2021-08-13: qty 30

## 2021-08-13 MED ORDER — ACETAMINOPHEN 500 MG PO TABS
1000.0000 mg | ORAL_TABLET | Freq: Once | ORAL | Status: AC
  Administered 2021-08-13: 1000 mg via ORAL

## 2021-08-13 MED ORDER — LACTATED RINGERS IV SOLN: INTRAVENOUS | Status: DC

## 2021-08-13 MED ORDER — LIDOCAINE 2% (20 MG/ML) 5 ML SYRINGE
INTRAMUSCULAR | Status: DC | PRN
  Administered 2021-08-13: 80 mg via INTRAVENOUS

## 2021-08-13 MED ORDER — LABETALOL HCL 5 MG/ML IV SOLN
INTRAVENOUS | Status: AC
  Administered 2021-08-13: 10 mg via INTRAVENOUS
  Filled 2021-08-13: qty 4

## 2021-08-13 MED ORDER — CEFAZOLIN SODIUM-DEXTROSE 2-3 GM-%(50ML) IV SOLR
INTRAVENOUS | Status: DC | PRN
  Administered 2021-08-13: 2 g via INTRAVENOUS

## 2021-08-13 MED ORDER — FENTANYL CITRATE (PF) 100 MCG/2ML IJ SOLN
INTRAMUSCULAR | Status: AC
  Filled 2021-08-13: qty 2

## 2021-08-13 MED ORDER — CEFAZOLIN SODIUM-DEXTROSE 2-4 GM/100ML-% IV SOLN
INTRAVENOUS | Status: AC
  Filled 2021-08-13: qty 100

## 2021-08-13 MED ORDER — DEXAMETHASONE SODIUM PHOSPHATE 10 MG/ML IJ SOLN
INTRAMUSCULAR | Status: DC | PRN
  Administered 2021-08-13: 10 mg via INTRAVENOUS

## 2021-08-13 MED ORDER — LABETALOL HCL 5 MG/ML IV SOLN: 10.0000 mg | INTRAVENOUS | Status: DC | PRN

## 2021-08-13 MED ORDER — FENTANYL CITRATE PF 50 MCG/ML IJ SOSY: 25.0000 ug | PREFILLED_SYRINGE | INTRAMUSCULAR | Status: DC | PRN

## 2021-08-13 MED ORDER — 0.9 % SODIUM CHLORIDE (POUR BTL) OPTIME
TOPICAL | Status: DC | PRN
  Administered 2021-08-13: 1000 mL

## 2021-08-13 MED ORDER — LABETALOL HCL 5 MG/ML IV SOLN: 10.0000 mg | Freq: Once | INTRAVENOUS | Status: AC

## 2021-08-13 MED ORDER — PROPOFOL 10 MG/ML IV BOLUS
INTRAVENOUS | Status: AC
  Filled 2021-08-13: qty 20

## 2021-08-13 MED ORDER — ONDANSETRON HCL 4 MG/2ML IJ SOLN: 4.0000 mg | Freq: Once | INTRAMUSCULAR | Status: DC | PRN

## 2021-08-13 MED ORDER — HYDRALAZINE HCL 20 MG/ML IJ SOLN
INTRAMUSCULAR | Status: AC
  Administered 2021-08-13: 10 mg via INTRAVENOUS
  Filled 2021-08-13: qty 1

## 2021-08-13 MED ORDER — ORAL CARE MOUTH RINSE: 15.0000 mL | Freq: Once | OROMUCOSAL | Status: AC

## 2021-08-13 MED ORDER — CHLORHEXIDINE GLUCONATE CLOTH 2 % EX PADS: 6.0000 | MEDICATED_PAD | Freq: Once | CUTANEOUS | Status: DC

## 2021-08-13 MED ORDER — FENTANYL CITRATE (PF) 100 MCG/2ML IJ SOLN
INTRAMUSCULAR | Status: DC | PRN
  Administered 2021-08-13: 100 ug via INTRAVENOUS

## 2021-08-13 MED ORDER — LIDOCAINE HCL 1 % IJ SOLN
INTRAMUSCULAR | Status: DC | PRN
  Administered 2021-08-13: 5 mL

## 2021-08-13 SURGICAL SUPPLY — 68 items
BAG COUNTER SPONGE SURGICOUNT (BAG) IMPLANT
BAG SPNG CNTER NS LX DISP (BAG)
BANDAGE ACE 6X5 VEL STRL LF (GAUZE/BANDAGES/DRESSINGS) ×1 IMPLANT
BLADE HEX COATED 2.75 (ELECTRODE) ×1 IMPLANT
BLADE OSCILLATING/SAGITTAL (BLADE)
BLADE SURG 15 STRL LF DISP TIS (BLADE) ×2 IMPLANT
BLADE SURG 15 STRL SS (BLADE) ×2
BLADE SW THK.38XMED LNG THN (BLADE) ×1 IMPLANT
BNDG CMPR 75X21 PLY HI ABS (MISCELLANEOUS)
BNDG CMPR 9X4 STRL LF SNTH (GAUZE/BANDAGES/DRESSINGS)
BNDG ELASTIC 3X5.8 VLCR STR LF (GAUZE/BANDAGES/DRESSINGS) ×1 IMPLANT
BNDG ELASTIC 6X5.8 VLCR STR LF (GAUZE/BANDAGES/DRESSINGS) ×1 IMPLANT
BNDG ESMARK 4X9 LF (GAUZE/BANDAGES/DRESSINGS) ×1 IMPLANT
BNDG GAUZE DERMACEA FLUFF (GAUZE/BANDAGES/DRESSINGS) ×1
BNDG GAUZE DERMACEA FLUFF 4 (GAUZE/BANDAGES/DRESSINGS) ×1 IMPLANT
BNDG GZE DERMACEA 4 6PLY (GAUZE/BANDAGES/DRESSINGS) ×1
BUR EGG ELITE 4.0 (BURR) ×1 IMPLANT
COVER BACK TABLE 60X90IN (DRAPES) ×2 IMPLANT
CUFF TOURN SGL QUICK 18X4 (TOURNIQUET CUFF) IMPLANT
DRAPE 3/4 80X56 (DRAPES) ×1 IMPLANT
DRAPE EXTREMITY T 121X128X90 (DISPOSABLE) ×2 IMPLANT
DRAPE IMP U-DRAPE 54X76 (DRAPES) ×1 IMPLANT
DRAPE OEC MINIVIEW 54X84 (DRAPES) ×1 IMPLANT
DRSG EMULSION OIL 3X3 NADH (GAUZE/BANDAGES/DRESSINGS) ×1 IMPLANT
DURAPREP 26ML APPLICATOR (WOUND CARE) IMPLANT
ELECT REM PT RETURN 15FT ADLT (MISCELLANEOUS) ×2 IMPLANT
GAUZE 4X4 16PLY ~~LOC~~+RFID DBL (SPONGE) ×1 IMPLANT
GAUZE PAD ABD 8X10 STRL (GAUZE/BANDAGES/DRESSINGS) ×2 IMPLANT
GAUZE SPONGE 4X4 12PLY STRL (GAUZE/BANDAGES/DRESSINGS) ×2 IMPLANT
GAUZE STRETCH 2X75IN STRL (MISCELLANEOUS) ×1 IMPLANT
GAUZE XEROFORM 1X8 LF (GAUZE/BANDAGES/DRESSINGS) ×1 IMPLANT
GLOVE BIO SURGEON STRL SZ7.5 (GLOVE) ×3 IMPLANT
GLOVE BIOGEL PI IND STRL 7.5 (GLOVE) ×1 IMPLANT
GLOVE BIOGEL PI IND STRL 8 (GLOVE) IMPLANT
GLOVE BIOGEL PI INDICATOR 7.5 (GLOVE)
GLOVE BIOGEL PI INDICATOR 8 (GLOVE) ×1
GLOVE ECLIPSE 7.5 STRL STRAW (GLOVE) ×1 IMPLANT
GOWN STRL REUS W/ TWL LRG LVL3 (GOWN DISPOSABLE) ×1 IMPLANT
GOWN STRL REUS W/ TWL XL LVL3 (GOWN DISPOSABLE) ×1 IMPLANT
GOWN STRL REUS W/TWL LRG LVL3 (GOWN DISPOSABLE) ×2
GOWN STRL REUS W/TWL XL LVL3 (GOWN DISPOSABLE) ×2
KIT BASIN OR (CUSTOM PROCEDURE TRAY) ×2 IMPLANT
KIT TURNOVER KIT A (KITS) IMPLANT
NDL BIOPSY JAMSHIDI 11X6 (NEEDLE) IMPLANT
NDL HYPO 25X1 1.5 SAFETY (NEEDLE) ×2 IMPLANT
NDL SAFETY ECLIPSE 18X1.5 (NEEDLE) IMPLANT
NEEDLE BIOPSY JAMSHIDI 11X6 (NEEDLE) ×2 IMPLANT
NEEDLE HYPO 18GX1.5 SHARP (NEEDLE)
NEEDLE HYPO 25X1 1.5 SAFETY (NEEDLE) ×2 IMPLANT
NS IRRIG 1000ML POUR BTL (IV SOLUTION) IMPLANT
PADDING CAST ABS 4INX4YD NS (CAST SUPPLIES)
PADDING CAST ABS COTTON 4X4 ST (CAST SUPPLIES) ×1 IMPLANT
PENCIL SMOKE EVACUATOR (MISCELLANEOUS) ×1 IMPLANT
STOCKINETTE 6  STRL (DRAPES)
STOCKINETTE 6 STRL (DRAPES) ×1 IMPLANT
STRIP SUTURE WOUND CLOSURE 1/2 (MISCELLANEOUS) IMPLANT
SUT ETHILON 3 0 PS 1 (SUTURE) ×2 IMPLANT
SUT MNCRL AB 3-0 PS2 18 (SUTURE) ×1 IMPLANT
SUT MNCRL AB 4-0 PS2 18 (SUTURE) IMPLANT
SUT MON AB 5-0 PS2 18 (SUTURE) IMPLANT
SUT PROLENE 2 0 SH DA (SUTURE) ×1 IMPLANT
SUT PROLENE 3 0 PS 2 (SUTURE) ×1 IMPLANT
SYR 10ML LL (SYRINGE) ×1 IMPLANT
SYR BULB EAR ULCER 3OZ GRN STR (SYRINGE) ×2 IMPLANT
SYR CONTROL 10ML LL (SYRINGE) ×3 IMPLANT
TOWEL OR 17X26 10 PK STRL BLUE (TOWEL DISPOSABLE) ×1 IMPLANT
TUBING CONNECTING 10 (TUBING) ×1 IMPLANT
UNDERPAD 30X36 HEAVY ABSORB (UNDERPADS AND DIAPERS) ×2 IMPLANT

## 2021-08-13 NOTE — Anesthesia Procedure Notes (Signed)
Procedure Name: LMA Insertion Date/Time: 08/13/2021 2:33 PM  Performed by: Gean Maidens, CRNAPre-anesthesia Checklist: Patient identified, Emergency Drugs available, Suction available, Patient being monitored and Timeout performed Patient Re-evaluated:Patient Re-evaluated prior to induction Oxygen Delivery Method: Circle system utilized Preoxygenation: Pre-oxygenation with 100% oxygen Induction Type: IV induction Ventilation: Mask ventilation without difficulty LMA: LMA inserted LMA Size: 4.0 Number of attempts: 1 Placement Confirmation: positive ETCO2 and breath sounds checked- equal and bilateral Tube secured with: Tape Dental Injury: Teeth and Oropharynx as per pre-operative assessment

## 2021-08-13 NOTE — Brief Op Note (Signed)
08/13/2021  3:03 PM  PATIENT:  [name]  86 y.o. male  PRE-OPERATIVE DIAGNOSIS:  right foot ulcer  POST-OPERATIVE DIAGNOSIS:  right foot ulcer  PROCEDURE:  Procedure(s): BONE BIOPSY (Right) DEBRIDEMENT WOUND (Right)  SURGEON:  Surgeon(s) and Role:    * Trula Slade, DPM - Primary  PHYSICIAN ASSISTANT:   ASSISTANTS: none   ANESTHESIA:   general  EBL:  5 mL   BLOOD ADMINISTERED:none  DRAINS: none   LOCAL MEDICATIONS USED:  MARCAINE   , BUPIVICAINE , and Amount: 10 ml  SPECIMEN:  Source of Specimen:  Right foot calcaneous pathology and microbiology  DISPOSITION OF SPECIMEN:  PATHOLOGY  COUNTS:  YES  TOURNIQUET:  * No tourniquets in log *  DICTATION: .Dragon Dictation  PLAN OF CARE: Discharge to home after PACU  PATIENT DISPOSITION:  PACU - hemodynamically stable.   Delay start of Pharmacological VTE agent (>24hrs) due to surgical blood loss or risk of bleeding: no  Intraoperative findings: Underwent bone biopsy, bone appeared hard. Debrided ulcer, no pus. Small incision made lateral to the Achilles along the area of concern for the hematoma, no purulence noted.

## 2021-08-13 NOTE — Progress Notes (Signed)
Orthopedic Tech Progress Note Patient Details:  [name] 02/23/1935 [id number]  Patient ID: [name], male   DOB: [birth year], 86 y.o.   MRN: [id number]  [name] 08/13/2021, 3:38 PM Right post op shoe applied in pacu

## 2021-08-13 NOTE — Interval H&P Note (Signed)
History and Physical Interval Note:  08/13/2021 1:59 PM  [name]  has presented today for surgery, with the diagnosis of ulcer.  The various methods of treatment have been discussed with the patient and family. After consideration of risks, benefits and other options for treatment, the patient has consented to  Procedure(s): BONE BIOPSY (Right) DEBRIDEMENT WOUND (Right) as a surgical intervention.  The patient's history has been reviewed, patient examined, no change in status, stable for surgery.  I have reviewed the patient's chart and labs.  Questions were answered to the patient's satisfaction.     Trula Slade

## 2021-08-13 NOTE — H&P (Signed)
Anesthesia H&P Update: History and Physical Exam reviewed; patient is OK for planned anesthetic and procedure. ? ?

## 2021-08-13 NOTE — Discharge Instructions (Addendum)
Resume all home medications as prior, use Tramadol you have at home for pain as needed. Keep right foot wound clean and dry. Do not remove bandages till your appointment with Dr.Wagoner. Do not get right foot wound wet. Elevate foot  when sitting or in bed at the level of your heart as needed for circulation. Weight bearing as tolerated on the right foot. Use your walker for ambulation as you did prior to surgery prevent falls. Make sure you have bowel movement every 3 days if you take the narcotic Tramadol. May use over the counter laxatives as needed aide in bowel movement such as colace,miralax,prune juice etc. Wear the walking shoe you were provided after surgery when ambulating.

## 2021-08-13 NOTE — Anesthesia Postprocedure Evaluation (Signed)
Anesthesia Post Note  Patient: [name]  Procedure(s) Performed: BONE BIOPSY (Right) DEBRIDEMENT WOUND (Right)     Patient location during evaluation: PACU Anesthesia Type: General Level of consciousness: awake and alert Pain management: pain level controlled Vital Signs Assessment: post-procedure vital signs reviewed and stable Respiratory status: spontaneous breathing, nonlabored ventilation, respiratory function stable and patient connected to nasal cannula oxygen Cardiovascular status: blood pressure returned to baseline and stable Postop Assessment: no apparent nausea or vomiting Anesthetic complications: no   No notable events documented.  Last Vitals:  Vitals:   08/13/21 1630 08/13/21 1650  BP: (!) 209/116 (!) 181/109  Pulse: 90 87  Resp: 13 12  Temp:    SpO2: 94% 98%    Last Pain:  Vitals:   08/13/21 1653  TempSrc: Oral  PainSc: 0-No pain                 Santa Lighter

## 2021-08-13 NOTE — Transfer of Care (Signed)
Immediate Anesthesia Transfer of Care Note  Patient: [name]  Procedure(s) Performed: BONE BIOPSY (Right) DEBRIDEMENT WOUND (Right)  Patient Location: PACU  Anesthesia Type:General  Level of Consciousness: sedated, patient cooperative and responds to stimulation  Airway & Oxygen Therapy: Patient Spontanous Breathing and Patient connected to face mask oxygen  Post-op Assessment: Report given to RN and Post -op Vital signs reviewed and stable  Post vital signs: Reviewed and stable  Last Vitals:  Vitals Value Taken Time  BP 173/112 08/13/21 1505  Temp    Pulse 77 08/13/21 1505  Resp 13 08/13/21 1505  SpO2 100 % 08/13/21 1505  Vitals shown include unvalidated device data.  Last Pain:  Vitals:   08/13/21 1145  TempSrc:   PainSc: 2       Patients Stated Pain Goal: 2 ([id number])  Complications: No notable events documented.

## 2021-08-14 ENCOUNTER — Encounter (HOSPITAL_COMMUNITY): Payer: Self-pay | Admitting: Podiatry

## 2021-08-15 LAB — SURGICAL PATHOLOGY

## 2021-08-16 NOTE — Op Note (Signed)
PATIENT:  [name]  86 y.o. male   PRE-OPERATIVE DIAGNOSIS:  right foot ulcer   POST-OPERATIVE DIAGNOSIS:  right foot ulcer   PROCEDURE:  Procedure(s): BONE BIOPSY (Right) DEBRIDEMENT WOUND (Right)   SURGEON:  Surgeon(s) and Role:    * Trula Slade, DPM - Primary   PHYSICIAN ASSISTANT:    ASSISTANTS: none    ANESTHESIA:   general   EBL:  5 mL    BLOOD ADMINISTERED:none   DRAINS: none    LOCAL MEDICATIONS USED:  MARCAINE   , BUPIVICAINE , and Amount: 10 ml   SPECIMEN:  Source of Specimen:  Right foot calcaneous pathology and microbiology   DISPOSITION OF SPECIMEN:  PATHOLOGY   COUNTS:  YES   TOURNIQUET:  * No tourniquets in log *   DICTATION: .Dragon Dictation   PLAN OF CARE: Discharge to home after PACU   PATIENT DISPOSITION:  PACU - hemodynamically stable.   Delay start of Pharmacological VTE agent (>24hrs) due to surgical blood loss or risk of bleeding: no   Intraoperative findings: Underwent bone biopsy, bone appeared hard. Debrided ulcer, no pus. Small incision made lateral to the Achilles along the area of concern for the hematoma, no purulence noted.   Indications for surgery: 86 year old male present to the office with concerns of a wound on the heel.  MRI was performed by his primary care doctor which did show concern for osteomyelitis.  Given this and high risk of limb loss given bone infection recommend debridement of the wound as well as bone biopsy.  Alternatives, risks, complications were discussed.  No promises or guarantees were given aspect of the procedure all questions were answered to the best my ability.  Procedure in detail: The patient was both verbally and visually identified by myself, nursing staff, and the anesthesia staff.  He was then transferred to the operative room via stretcher with the surgery took place.  LMA was placed.  Right lower extremities and scrubbed, prepped, draped in normal sterile fashion.  Timeout was  performed.  Just adjacent to the area of ulceration a small stab incision was made with a 15 with scalpel blunt dissection was then carried down to the bone I utilized a Jamshidi needle in order to remove a piece of bone underneath the area of the wound and this was sent to both microbiology as well as pathology.  The bone did appear to be hard in nature.  There is no purulence.  MRI did show concern for hematoma on the lateral aspect Achilles tendon.  A small stab incision is made to this area and there is no purulence noted.  I copiously irrigated both of the incisions and each of the incisions were closed with nylon.  One of the posterior aspect of calcaneus were to be superficial any probing to bone.  I did sharply debride the wound utilizing a #15 with scalpel and healthy, bleeding tissue.  This was to the level of subcutaneous tissue there is no exposed bone.  Wound measured 2 x 1.5 x 0.2 cm.  Pre and post wound measurements were the same.  Xeroform was applied to the incisions as well as the wound followed by dry sterile dressing.  At this time he was awoken from anesthesia and found to tolerate the procedure well any complications.  He was transferred to PACU vital signs stable vascular status intact.  I spoke to the patient's daughter after the surgery discussed the care of the wound as well as  intraoperative findings.  We will follow-up next week as scheduled or sooner if there is any issues or concerns.

## 2021-08-18 ENCOUNTER — Ambulatory Visit: Payer: Medicare Other | Admitting: Podiatry

## 2021-08-18 ENCOUNTER — Ambulatory Visit (INDEPENDENT_AMBULATORY_CARE_PROVIDER_SITE_OTHER): Payer: Medicare Other

## 2021-08-18 DIAGNOSIS — Z9889 Other specified postprocedural states: Secondary | ICD-10-CM | POA: Diagnosis not present

## 2021-08-18 LAB — AEROBIC/ANAEROBIC CULTURE W GRAM STAIN (SURGICAL/DEEP WOUND): Gram Stain: NONE SEEN

## 2021-08-18 NOTE — Progress Notes (Signed)
Patient seen today at the office for POV #1 DOS 08/13/2021 right foot bone biopsy, debridement of ulcer. Patient denies any fever, nausea, vomiting, chills or chest pain at this time. X-ray was obtained and reviewed by provider.   Dressing was removed. Sutures intact. No pain at this time.  Provider assessed incision site. Provider advise to keep foot clean and dry and to continue using the surgery shoe.   Silvadene was applied to incisions, covered incisions with an non-adhesive dressing, followed by gauze wrap, secured with coban and stockinet.   Patient will return to clinic next week for possible suture removal.   Patient has an ulcer on 2nd toe from hitting it in the car. Provider will continue to monitor ulcer.

## 2021-08-22 ENCOUNTER — Telehealth: Payer: Self-pay | Admitting: *Deleted

## 2021-08-26 ENCOUNTER — Encounter (HOSPITAL_BASED_OUTPATIENT_CLINIC_OR_DEPARTMENT_OTHER): Payer: Medicare Other | Attending: Internal Medicine | Admitting: Internal Medicine

## 2021-08-26 DIAGNOSIS — E1151 Type 2 diabetes mellitus with diabetic peripheral angiopathy without gangrene: Secondary | ICD-10-CM | POA: Insufficient documentation

## 2021-08-26 DIAGNOSIS — I739 Peripheral vascular disease, unspecified: Secondary | ICD-10-CM | POA: Diagnosis not present

## 2021-08-26 DIAGNOSIS — M86171 Other acute osteomyelitis, right ankle and foot: Secondary | ICD-10-CM

## 2021-08-26 DIAGNOSIS — E1142 Type 2 diabetes mellitus with diabetic polyneuropathy: Secondary | ICD-10-CM | POA: Diagnosis not present

## 2021-08-26 DIAGNOSIS — I4891 Unspecified atrial fibrillation: Secondary | ICD-10-CM | POA: Diagnosis not present

## 2021-08-26 DIAGNOSIS — E11621 Type 2 diabetes mellitus with foot ulcer: Secondary | ICD-10-CM

## 2021-08-26 DIAGNOSIS — E1140 Type 2 diabetes mellitus with diabetic neuropathy, unspecified: Secondary | ICD-10-CM | POA: Diagnosis not present

## 2021-08-26 NOTE — Progress Notes (Signed)
[name], Kenneth L. ([id number]) Visit Report for 08/26/2021 Allergy List Details Patient Name: Date of Service: [name], Kenneth MES L. 08/26/2021 1:15 PM Medical Record Number: [id number] Patient Account Number: [id number] Date of Birth/Sex: Treating RN: 1935-11-25 (86 y.o. [name] Primary Care Maleny Candy: Dorthy Cooler, Dibas Other Clinician: Referring Taija Mathias: Treating Aashir Umholtz/Extender: Alanda Slim, [place] Weeks in Treatment: 0 Allergies Active Allergies allopurinol atorvastatin ibuprofen Allergy Notes Electronic Signature(s) Signed: 08/26/2021 3:57:26 PM By: Lorrin Jackson Entered By: Lorrin Jackson on 08/26/2021 13:22:35 -------------------------------------------------------------------------------- Arrival Information Details Patient Name: Date of Service: Kenneth Mons MES L. 08/26/2021 1:15 PM Medical Record Number: [id number] Patient Account Number: [id number] Date of Birth/Sex: Treating RN: [birth year] (86 y.o. [name] Primary Care Brice Kossman: Dorthy Cooler, Dibas Other Clinician: Referring Paxson Harrower: Treating Jaynia Fendley/Extender: Alanda Slim, [place]ib[place] Weeks in Treatment: 0 Visit Information Patient Arrived: Wheel Chair Arrival Time: 13:20 Accompanied By: Daughter Transfer Assistance: Manual Patient Identification Verified: Yes Secondary Verification Process Completed: Yes Patient Requires Transmission-Based Precautions: No Patient Has Alerts: Yes Patient Alerts: Patient on Blood Thinner ABI R=.58 TBI R=[place] Electronic Signature(s) Signed: 08/26/2021 3:57:26 PM By: Lorrin Jackson Entered By: Lorrin Jackson on 08/26/2021 14:13:06 -------------------------------------------------------------------------------- Clinic Level of Care Assessment Details Patient Name: Date of Service: [name], Kenneth MES L. 08/26/2021 1:15 PM Medical Record Number: [id number] Patient Account Number: [id number] Date of Birth/Sex: Treating RN: 04/02/1935 (86 y.o. [name] Primary  Care Aleks Nawrot: Dorthy Cooler, Dibas Other Clinician: Referring Aleeyah Bensen: Treating Ender Rorke/Extender: Alanda Slim, Dibas Weeks in Treatment: 0 Clinic Level of Care Assessment Items TOOL 1 Quantity Score X- 1 0 Use when EandM and Procedure is performed on INITIAL visit ASSESSMENTS - Nursing Assessment / Reassessment X- 1 20 General Physical Exam (combine w/ comprehensive assessment (listed just below) when performed on new pt. evals) X- 1 25 Comprehensive Assessment (HX, ROS, Risk Assessments, Wounds Hx, etc.) ASSESSMENTS - Wound and Skin Assessment / Reassessment '[]'$  - 0 Dermatologic / Skin Assessment (not related to wound area) ASSESSMENTS - Ostomy and/or Continence Assessment and Care '[]'$  - 0 Incontinence Assessment and Management '[]'$  - 0 Ostomy Care Assessment and Management (repouching, etc.) PROCESS - Coordination of Care '[]'$  - 0 Simple Patient / Family Education for ongoing care X- 1 20 Complex (extensive) Patient / Family Education for ongoing care X- 1 10 Staff obtains Programmer, systems, Records, T Results / Process Orders est X- 1 10 Staff telephones HHA, Nursing Homes / Clarify orders / etc '[]'$  - 0 Routine Transfer to another Facility (non-emergent condition) '[]'$  - 0 Routine Hospital Admission (non-emergent condition) '[]'$  - 0 New Admissions / Biomedical engineer / Ordering NPWT Apligraf, etc. , '[]'$  - 0 Emergency Hospital Admission (emergent condition) PROCESS - Special Needs '[]'$  - 0 Pediatric / Minor Patient Management '[]'$  - 0 Isolation Patient Management '[]'$  - 0 Hearing / Language / Visual special needs '[]'$  - 0 Assessment of Community assistance (transportation, D/C planning, etc.) '[]'$  - 0 Additional assistance / Altered mentation '[]'$  - 0 Support Surface(s) Assessment (bed, cushion, seat, etc.) INTERVENTIONS - Miscellaneous '[]'$  - 0 External ear exam '[]'$  - 0 Patient Transfer (multiple staff / Civil Service fast streamer / Similar devices) '[]'$  - 0 Simple Staple / Suture removal  (25 or less) '[]'$  - 0 Complex Staple / Suture removal (26 or more) '[]'$  - 0 Hypo/Hyperglycemic Management (do not check if billed separately) '[]'$  - 0 Ankle / Brachial Index (ABI) - do not check if billed separately Has the patient been seen at the hospital within the last three years: Yes Total Score: 85  Level Of Care: New/Established - Level 3 Electronic Signature(s) Signed: 08/26/2021 3:57:26 PM By: Lorrin Jackson Signed: 08/26/2021 3:57:26 PM By: Lorrin Jackson Entered By: Lorrin Jackson on 08/26/2021 14:41:04 -------------------------------------------------------------------------------- Encounter Discharge Information Details Patient Name: Date of Service: Kenneth Mons MES L. 08/26/2021 1:15 PM Medical Record Number: [id number] Patient Account Number: [id number] Date of Birth/Sex: Treating RN: 08-02-1935 (86 y.o. [name] Primary Care Natalie Leclaire: Dorthy Cooler, Dibas Other Clinician: Referring Nneka Blanda: Treating Finnlee Silvernail/Extender: Alanda Slim, Dibas Weeks in Treatment: 0 Encounter Discharge Information Items Post Procedure Vitals Discharge Condition: Stable Temperature (F): 97.9 Ambulatory Status: Wheelchair Pulse (bpm): 101 Discharge Destination: Home Respiratory Rate (breaths/min): 18 Transportation: Private Auto Blood Pressure (mmHg): 158/82 Accompanied By: Daughter Schedule Follow-up Appointment: Yes Clinical Summary of Care: Provided on 08/26/2021 Form Type Recipient Paper Patient Patient Electronic Signature(s) Signed: 08/26/2021 2:42:34 PM By: Lorrin Jackson Entered By: Lorrin Jackson on 08/26/2021 14:42:34 -------------------------------------------------------------------------------- Lower Extremity Assessment Details Patient Name: Date of Service: Kenneth Mons MES L. 08/26/2021 1:15 PM Medical Record Number: [id number] Patient Account Number: [id number] Date of Birth/Sex: Treating RN: 1935-01-31 (86 y.o. [name] Primary Care Gwendalynn Eckstrom: Dorthy Cooler,  Dibas Other Clinician: Referring Rheannon Cerney: Treating Fujie Dickison/Extender: Alanda Slim, Dibas Weeks in Treatment: 0 Edema Assessment Assessed: [Left: No] [Right: Yes] Edema: [Left: Ye] [Right: s] Calf Left: Right: Point of Measurement: 42 cm From Medial Instep 37 cm Ankle Left: Right: Point of Measurement: 8 cm From Medial Instep 22.1 cm Knee To Floor Left: Right: From Medial Instep 48 cm Vascular Assessment Pulses: Dorsalis Pedis Palpable: [Right:No Yes] Notes ABI's done 08/06/21 R=0.58 L=0.53 TBI's R=[place] L=0.41 Electronic Signature(s) Signed: 08/26/2021 3:57:26 PM By: Lorrin Jackson Entered By: Lorrin Jackson on 08/26/2021 13:38:27 -------------------------------------------------------------------------------- Multi Wound Chart Details Patient Name: Date of Service: Kenneth Mons MES L. 08/26/2021 1:15 PM Medical Record Number: [id number] Patient Account Number: [id number] Date of Birth/Sex: Treating RN: February 23, 1935 (86 y.o. M) Primary Care Jakeira Seeman: Dorthy Cooler, Dibas Other Clinician: Referring Yittel Emrich: Treating Nuvia Hileman/Extender: Alanda Slim, Dibas Weeks in Treatment: 0 Vital Signs Height(in): 68 Pulse(bpm): 101 Weight(lbs): 198 Blood Pressure(mmHg): 158/82 Body Mass Index(BMI): 30.1 Temperature(F): 97.9 Respiratory Rate(breaths/min): 18 Photos: [N/A:N/A] Right Calcaneus N/A N/A Wound Location: Gradually Appeared N/A N/A Wounding Event: Diabetic Wound/Ulcer of the Lower N/A N/A Primary Etiology: Extremity Cataracts, Angina, Type II Diabetes, N/A N/A Comorbid History: Gout, Osteoarthritis 06/05/2021 N/A N/A Date Acquired: 0 N/A N/A Weeks of Treatment: Open N/A N/A Wound Status: No N/A N/A Wound Recurrence: 1.3x1.5x0.3 N/A N/A Measurements L x W x D (cm) 1.532 N/A N/A A (cm) : rea 0.459 N/A N/A Volume (cm) : 0.00% N/A N/A % Reduction in A rea: 0.00% N/A N/A % Reduction in Volume: Grade 1 N/A N/A Classification: Medium N/A  N/A Exudate A mount: Serosanguineous N/A N/A Exudate Type: red, brown N/A N/A Exudate Color: Well defined, not attached N/A N/A Wound Margin: Large (67-100%) N/A N/A Granulation A mount: Red N/A N/A Granulation Quality: Small (1-33%) N/A N/A Necrotic A mount: Fat Layer (Subcutaneous Tissue): Yes N/A N/A Exposed Structures: Fascia: No Tendon: No Muscle: No Joint: No Bone: No None N/A N/A Epithelialization: Chemical/Enzymatic/Mechanical N/A N/A Debridement: Pre-procedure Verification/Time Out 14:10 N/A N/A Taken: N/A N/A N/A Instrument: None N/A N/A Bleeding: Procedure was tolerated well N/A N/A Debridement Treatment Response: 1.3x1.5x0.3 N/A N/A Post Debridement Measurements L x W x D (cm) 0.459 N/A N/A Post Debridement Volume: (cm) Debridement N/A N/A Procedures Performed: Treatment Notes Electronic Signature(s) Signed: 08/26/2021 3:51:23 PM By: Kalman Shan DO Entered By: Kalman Shan on  08/26/2021 14:33:10 -------------------------------------------------------------------------------- Multi-Disciplinary Care Plan Details Patient Name: Date of Service: [name], Kenneth MES L. 08/26/2021 1:15 PM Medical Record Number: [id number] Patient Account Number: [id number] Date of Birth/Sex: Treating RN: 01-06-1935 (86 y.o. [name] Primary Care Eneida Evers: Dorthy Cooler, Dibas Other Clinician: Referring Cesilia Shinn: Treating Henok Heacock/Extender: Alanda Slim, Dibas Weeks in Treatment: 0 Active Inactive Pressure Nursing Diagnoses: Knowledge deficit related to management of pressures ulcers Goals: Patient/caregiver will verbalize understanding of pressure ulcer management Date Initiated: 08/26/2021 Target Resolution Date: 09/23/2021 Goal Status: Active Interventions: Assess: immobility, friction, shearing, incontinence upon admission and as needed Assess offloading mechanisms upon admission and as needed Provide education on pressure  ulcers Notes: Wound/Skin Impairment Nursing Diagnoses: Impaired tissue integrity Goals: Patient/caregiver will verbalize understanding of skin care regimen Date Initiated: 08/26/2021 Target Resolution Date: 09/23/2021 Goal Status: Active Ulcer/skin breakdown will have a volume reduction of 30% by week 4 Date Initiated: 08/26/2021 Target Resolution Date: 09/23/2021 Goal Status: Active Interventions: Assess patient/caregiver ability to obtain necessary supplies Assess patient/caregiver ability to perform ulcer/skin care regimen upon admission and as needed Assess ulceration(s) every visit Provide education on ulcer and skin care Treatment Activities: Topical wound management initiated : 08/26/2021 Notes: Electronic Signature(s) Signed: 08/26/2021 3:57:26 PM By: Lorrin Jackson Entered By: Lorrin Jackson on 08/26/2021 13:39:52 -------------------------------------------------------------------------------- Pain Assessment Details Patient Name: Date of Service: Kenneth Mons MES L. 08/26/2021 1:15 PM Medical Record Number: [id number] Patient Account Number: [id number] Date of Birth/Sex: Treating RN: 1935/07/03 (86 y.o. [name] Primary Care Areana Kosanke: Dorthy Cooler, Dibas Other Clinician: Referring Vanassa Penniman: Treating Menashe Kafer/Extender: Alanda Slim, Dibas Weeks in Treatment: 0 Active Problems Location of Pain Severity and Description of Pain Patient Has Paino No Site Locations Pain Management and Medication Current Pain Management: Electronic Signature(s) Signed: 08/26/2021 3:57:26 PM By: Lorrin Jackson Entered By: Lorrin Jackson on 08/26/2021 14:10:12 -------------------------------------------------------------------------------- Patient/Caregiver Education Details Patient Name: Date of Service: Kenneth Mons MES L. 8/22/2023andnbsp1:15 PM Medical Record Number: [id number] Patient Account Number: [id number] Date of Birth/Gender: Treating RN: Jun 04, 1935 (86 y.o. [name] Primary Care Physician: Dorthy Cooler, Dibas Other Clinician: Referring Physician: Treating Physician/Extender: Alanda Slim, Dibas Weeks in Treatment: 0 Education Assessment Education Provided To: Patient Education Topics Provided Pressure: Methods: Demonstration, Explain/Verbal, Printed Responses: State content correctly Wound/Skin Impairment: Methods: Demonstration, Explain/Verbal, Printed Responses: State content correctly Electronic Signature(s) Signed: 08/26/2021 3:57:26 PM By: Lorrin Jackson Entered By: Lorrin Jackson on 08/26/2021 13:40:15 -------------------------------------------------------------------------------- Wound Assessment Details Patient Name: Date of Service: Kenneth Mons MES L. 08/26/2021 1:15 PM Medical Record Number: [id number] Patient Account Number: [id number] Date of Birth/Sex: Treating RN: 1935/03/11 (86 y.o. M) Primary Care Darriel Utter: Dorthy Cooler, Dibas Other Clinician: Referring Harsha Yusko: Treating Sulo Janczak/Extender: Alanda Slim, Dibas Weeks in Treatment: 0 Wound Status Wound Number: 1 Primary Etiology: Diabetic Wound/Ulcer of the Lower Extremity Wound Location: Right Calcaneus Wound Status: Open Wounding Event: Gradually Appeared Comorbid History: Cataracts, Angina, Type II Diabetes, Gout, Osteoarthritis Date Acquired: 06/05/2021 Weeks Of Treatment: 0 Clustered Wound: No Photos Wound Measurements Length: (cm) 1.3 Width: (cm) 1.5 Depth: (cm) 0.3 Area: (cm) 1.532 Volume: (cm) 0.459 % Reduction in Area: 0% % Reduction in Volume: 0% Epithelialization: None Tunneling: No Undermining: No Wound Description Classification: Grade 1 Wound Margin: Well defined, not attached Exudate Amount: Medium Exudate Type: Serosanguineous Exudate Color: red, brown Foul Odor After Cleansing: No Slough/Fibrino Yes Wound Bed Granulation Amount: Large (67-100%) Exposed Structure Granulation Quality: Red Fascia Exposed:  No Necrotic Amount: Small (1-33%) Fat Layer (Subcutaneous Tissue) Exposed: Yes Necrotic Quality: Adherent Slough Tendon Exposed: No  Muscle Exposed: No Joint Exposed: No Bone Exposed: No Treatment Notes Wound #1 (Calcaneus) Wound Laterality: Right Cleanser Soap and Water Discharge Instruction: May shower and wash wound with dial antibacterial soap and water prior to dressing change. Peri-Wound Care Skin Prep Discharge Instruction: Use skin prep as directed Topical Primary Dressing MediHoney Gel, tube 1.5 (oz) Discharge Instruction: Apply to wound bed as instructed Secondary Dressing ALLEVYN Gentle Border, 3x3 (in/in) Discharge Instruction: Apply over primary dressing as directed. Optifoam Non-Adhesive Dressing, 4x4 in Discharge Instruction: Apply as donut over primary dressing as directed. Secured With Compression Wrap Compression Stockings Add-Ons Cotton Tip Swabs Electronic Signature(s) Signed: 08/26/2021 3:57:26 PM By: Lorrin Jackson Entered By: Lorrin Jackson on 08/26/2021 14:09:58 -------------------------------------------------------------------------------- Vitals Details Patient Name: Date of Service: Kenneth Mons MES L. 08/26/2021 1:15 PM Medical Record Number: [id number] Patient Account Number: [id number] Date of Birth/Sex: Treating RN: [birth year] (86 y.o. [name] Primary Care Latecia Miler: Dorthy Cooler, Dibas Other Clinician: Referring Neriyah Cercone: Treating Tamsyn Owusu/Extender: Alanda Slim, [place]ib[place] Weeks in Treatment: 0 Vital Signs Time Taken: 13:20 Temperature (F): 97.9 Height (in): 68 Pulse (bpm): 101 Source: Stated Respiratory Rate (breaths/min): 18 Weight (lbs): 198 Blood Pressure (mmHg): 158/82 Source: Stated Reference Range: 80 - 120 mg / dl Body Mass Index (BMI): 30.1 Electronic Signature(s) Signed: 08/26/2021 3:57:26 PM By: Lorrin Jackson Entered By: Lorrin Jackson on 08/26/2021 13:20:49

## 2021-08-26 NOTE — Progress Notes (Signed)
Gervase, Ceaser L. ([id number]) Visit Report for 08/26/2021 Chief Complaint Document Details Patient Name: Date of Service: [name], Kenneth MES L. 08/26/2021 1:15 PM Medical Record Number: [id number] Patient Account Number: [id number] Date of Birth/Sex: Treating RN: 07-25-1935 (86 y.o. M) Primary Care Provider: Dorthy Hood, Kenneth Other Clinician: Referring Provider: Treating Provider/Extender: [name], [name] in Treatment: 0 Information Obtained from: Patient Chief Complaint 08/26/2021; right foot wound Electronic Signature(s) Signed: 08/26/2021 3:51:23 PM By: Kenneth Shan DO Entered By: [name] on 08/26/2021 14:33:26 -------------------------------------------------------------------------------- Debridement Details Patient Name: Date of Service: Kenneth Mons MES L. 08/26/2021 1:15 PM Medical Record Number: [id number] Patient Account Number: [id number] Date of Birth/Sex: Treating RN: 28-Dec-1935 (86 y.o. [name] Primary Care Provider: Dorthy Hood, Kenneth Other Clinician: Referring Provider: Treating Provider/Extender: [name], [name] in Treatment: 0 Debridement Performed for Assessment: Wound #1 Right Calcaneus Performed By: Physician Kenneth Shan, DO Debridement Type: Chemical/Enzymatic/Mechanical Agent Used: gauze and wound cleanser Severity of Tissue Pre Debridement: Fat layer exposed Level of Consciousness (Pre-procedure): Awake and Alert Pre-procedure Verification/Time Out Yes - 14:10 Taken: Start Time: 14:11 Bleeding: None End Time: 14:15 Response to Treatment: Procedure was tolerated well Level of Consciousness (Post- Awake and Alert procedure): Post Debridement Measurements of Total Wound Length: (cm) 1.3 Width: (cm) 1.5 Depth: (cm) 0.3 Volume: (cm) 0.459 Character of Wound/Ulcer Post Debridement: Stable Severity of Tissue Post Debridement: Fat layer exposed Post Procedure Diagnosis Same as Pre-procedure Electronic  Signature(s) Signed: 08/26/2021 3:51:23 PM By: Kenneth Shan DO Signed: 08/26/2021 3:57:26 PM By: [name] Entered By: [name] on 08/26/2021 14:17:26 -------------------------------------------------------------------------------- HPI Details Patient Name: Date of Service: Kenneth Mons MES L. 08/26/2021 1:15 PM Medical Record Number: [id number] Patient Account Number: [id number] Date of Birth/Sex: Treating RN: Aug 22, 1935 (86 y.o. M) Primary Care Provider: Dorthy Hood, Kenneth Other Clinician: Referring Provider: Treating Provider/Extender: [name], [name] in Treatment: 0 History of Present Illness HPI Description: Admission 08/26/2021 [name] is an 86 year old male with a past medical history of controlled type 2 diabetes on oral agents with peripheral neuropathy and Atrial fibrillation not on blood thinners that presents to the clinic with a right foot wound that has waxed and waned in healing over the past year. He has peripheral neuropathy and is not sure when the wound started or how it started. He had an MRI done by his primary care physician on 08/03/2021 that showed potential early acute osteomyelitis at the posterior calcaneus. He was seen by [name] on 08/06/2021 and started on doxycycline and cefadroxil due to these findings. He was evaluated by podiatry, [name] and had a bone biopsy done on 08/13/2021 that did not show evidence of osteomyelitis. He had a culture that showed MRSA sensitive to doxycycline. He also had ABIs with TBI's done on 8/2 that showed an ABI of 0.58 and a noncompressible TBI. He is scheduled to see vein and vascular on 8/30. Currently he is keeping the wound covered. He is using a soft surgical shoe. He denies signs of infection. Electronic Signature(s) Signed: 08/26/2021 3:51:23 PM By: Kenneth Shan DO Entered By: [name] on 08/26/2021  14:44:24 -------------------------------------------------------------------------------- Physical Exam Details Patient Name: Date of Service: Kenneth Mons MES L. 08/26/2021 1:15 PM Medical Record Number: [id number] Patient Account Number: [id number] Date of Birth/Sex: Treating RN: June 29, 1935 (86 y.o. M) Primary Care Provider: Dorthy Hood, Kenneth Other Clinician: Referring Provider: Treating Provider/Extender: [name], [name] in Treatment: 0 Constitutional respirations regular, non-labored and within target range for patient.Marland Kitchen Psychiatric pleasant and  cooperative. Notes Right foot: T the calcaneus there is a punched-out wound with pale granulation tissue. No signs of surrounding infection. Difficult to palpate pedal pulses o however heard well on Doppler. Electronic Signature(s) Signed: 08/26/2021 3:51:23 PM By: Kenneth Shan DO Entered By: [name] on 08/26/2021 14:44:56 -------------------------------------------------------------------------------- Physician Orders Details Patient Name: Date of Service: Kenneth Mons MES L. 08/26/2021 1:15 PM Medical Record Number: [id number] Patient Account Number: [id number] Date of Birth/Sex: Treating RN: [birth year] (86 y.o. [name] Primary Care Provider: Dorthy Hood, Kenneth Other Clinician: Referring Provider: Treating Provider/Extender: [name], [name] in Treatment: 0 Verbal / Phone Orders: No Diagnosis Coding ICD-10 Coding Code Description M86.171 Other acute osteomyelitis, right ankle and foot E11.621 Type 2 diabetes mellitus with foot ulcer I73.9 Peripheral vascular disease, unspecified E11.40 Type 2 diabetes mellitus with diabetic neuropathy, unspecified Follow-up Appointments ppointment in 2 Hood. - 09/09/21 @ 11am with Dr. Heber [place] and Leveda Anna, RN (Room 7) Return A Anesthetic (In clinic) Topical Lidocaine 5% applied to wound bed (In clinic) Topical Lidocaine 4% applied to wound  bed Cellular or Tissue Based Products Other Cellular or Tissue Based Products Orders/Instructions: - Will consider after antibiotics and Vascular Appt. Bathing/ Shower/ Hygiene May shower and wash wound with soap and water. Edema Control - Lymphedema / SCD / Other Avoid standing for long periods of time. Moisturize legs daily. Off-Loading Open toe surgical shoe to: Other: - Keep pressure off area as much as is possible Additional Orders / Instructions Follow Nutritious Diet Wound Treatment Wound #1 - Calcaneus Wound Laterality: Right Cleanser: Soap and Water 1 x Per Day/30 Days Discharge Instructions: May shower and wash wound with dial antibacterial soap and water prior to dressing change. Peri-Wound Care: Skin Prep (DME) (Generic) 1 x Per Day/30 Days Discharge Instructions: Use skin prep as directed Prim Dressing: MediHoney Gel, tube 1.5 (oz) 1 x Per Day/30 Days ary Discharge Instructions: Apply to wound bed as instructed Secondary Dressing: ALLEVYN Gentle Border, 3x3 (in/in) (DME) (Dispense As Written) 1 x Per Day/30 Days Discharge Instructions: Apply over primary dressing as directed. Secondary Dressing: Optifoam Non-Adhesive Dressing, 4x4 in 1 x Per Day/30 Days Discharge Instructions: Apply as donut over primary dressing as directed. Add-Ons: Cotton Tip Swabs (DME) (Generic) 1 x Per Day/30 Days Electronic Signature(s) Signed: 08/26/2021 3:51:23 PM By: Kenneth Shan DO Entered By: [name] on 08/26/2021 14:45:05 -------------------------------------------------------------------------------- Problem List Details Patient Name: Date of Service: Kenneth Mons MES L. 08/26/2021 1:15 PM Medical Record Number: [id number] Patient Account Number: [id number] Date of Birth/Sex: Treating RN: 03-23-1935 (86 y.o. M) Primary Care Provider: Dorthy Hood, Kenneth Other Clinician: Referring Provider: Treating Provider/Extender: [name], [name] in Treatment: 0 Active  Problems ICD-10 Encounter Code Description Active Date MDM Diagnosis E11.621 Type 2 diabetes mellitus with foot ulcer 08/26/2021 No Yes I73.9 Peripheral vascular disease, unspecified 08/26/2021 No Yes M86.171 Other acute osteomyelitis, right ankle and foot 08/26/2021 No Yes E11.40 Type 2 diabetes mellitus with diabetic neuropathy, unspecified 08/26/2021 No Yes Inactive Problems Resolved Problems Electronic Signature(s) Signed: 08/26/2021 3:51:23 PM By: Kenneth Shan DO Entered By: [name] on 08/26/2021 14:33:02 -------------------------------------------------------------------------------- Progress Note Details Patient Name: Date of Service: Kenneth Mons MES L. 08/26/2021 1:15 PM Medical Record Number: [id number] Patient Account Number: [id number] Date of Birth/Sex: Treating RN: Mar 18, 1935 (86 y.o. M) Primary Care Provider: Dorthy Hood, Kenneth Other Clinician: Referring Provider: Treating Provider/Extender: [name], [name] in Treatment: 0 Subjective Chief Complaint Information obtained from Patient 08/26/2021; right foot wound History of Present Illness (HPI)  Admission 08/26/2021 [name] is an 86 year old male with a past medical history of controlled type 2 diabetes on oral agents with peripheral neuropathy and Atrial fibrillation not on blood thinners that presents to the clinic with a right foot wound that has waxed and waned in healing over the past year. He has peripheral neuropathy and is not sure when the wound started or how it started. He had an MRI done by his primary care physician on 08/03/2021 that showed potential early acute osteomyelitis at the posterior calcaneus. He was seen by [name] on 08/06/2021 and started on doxycycline and cefadroxil due to these findings. He was evaluated by podiatry, [name] and had a bone biopsy done on 08/13/2021 that did not show evidence of osteomyelitis. He had a culture that showed MRSA sensitive to  doxycycline. He also had ABIs with TBI's done on 8/2 that showed an ABI of 0.58 and a noncompressible TBI. He is scheduled to see vein and vascular on 8/30. Currently he is keeping the wound covered. He is using a soft surgical shoe. He denies signs of infection. Patient History Information obtained from Patient, Chart. Allergies allopurinol, atorvastatin, ibuprofen Family History Diabetes - Child, Stroke - Mother, No family history of Cancer, Heart Disease, Hereditary Spherocytosis, Hypertension, Kidney Disease, Lung Disease, Seizures, Thyroid Problems, Tuberculosis. Social History Former smoker, Marital Status - Divorced, Alcohol Use - Moderate, Drug Use - Prior History, Caffeine Use - Daily. Medical History Eyes Patient has history of Cataracts - Surgery Cardiovascular Patient has history of Angina - AFib Endocrine Patient has history of Type II Diabetes Musculoskeletal Patient has history of Gout, Osteoarthritis Denies history of Rheumatoid Arthritis, Osteomyelitis Neurologic Patient has history of Neuropathy Patient is treated with Controlled Diet, Oral Agents. Blood sugar is not tested. Medical A Surgical History Notes nd Cardiovascular Hx CVA Review of Systems (ROS) Eyes Complains or has symptoms of Glasses / Contacts - Reading. Ear/Nose/Mouth/Throat Denies complaints or symptoms of Chronic sinus problems or rhinitis. Respiratory Denies complaints or symptoms of Chronic or frequent coughs, Shortness of Breath. Gastrointestinal Denies complaints or symptoms of Frequent diarrhea, Nausea, Vomiting. Genitourinary Denies complaints or symptoms of Frequent urination. Integumentary (Skin) Complains or has symptoms of Wounds. Neurologic Denies complaints or symptoms of Numbness/parasthesias. Psychiatric Denies complaints or symptoms of Claustrophobia, Suicidal. Objective Constitutional respirations regular, non-labored and within target range for patient.. Vitals Time  Taken: 1:20 PM, Height: 68 in, Source: Stated, Weight: 198 lbs, Source: Stated, BMI: 30.1, Temperature: 97.9 F, Pulse: 101 bpm, Respiratory Rate: 18 breaths/min, Blood Pressure: 158/82 mmHg. Psychiatric pleasant and cooperative. General Notes: Right foot: T the calcaneus there is a punched-out wound with pale granulation tissue. No signs of surrounding infection. Difficult to palpate o pedal pulses however heard well on Doppler. Integumentary (Hair, Skin) Wound #1 status is Open. Original cause of wound was Gradually Appeared. The date acquired was: 06/05/2021. The wound is located on the Right Calcaneus. The wound measures 1.3cm length x 1.5cm width x 0.3cm depth; 1.532cm^2 area and 0.459cm^3 volume. There is Fat Layer (Subcutaneous Tissue) exposed. There is no tunneling or undermining noted. There is a medium amount of serosanguineous drainage noted. The wound margin is well defined and not attached to the wound base. There is large (67-100%) red granulation within the wound bed. There is a small (1-33%) amount of necrotic tissue within the wound bed including Adherent Slough. Assessment Active Problems ICD-10 Type 2 diabetes mellitus with foot ulcer Peripheral vascular disease, unspecified Other acute osteomyelitis, right ankle  and foot Type 2 diabetes mellitus with diabetic neuropathy, unspecified Patient presents with a 1 year history of nonhealing ulcer to the right foot in the setting of diabetes with peripheral neuropathy. There is a question whether this is osteomyelitis. Bone biopsy was negative however bone culture showed MRSA. He is currently on doxycycline and cefadroxil per infectious disease. He has completed 3 Hood of antibiotics. His ABIs show evidence of arterial insufficiency. He has an appointment with vein and vascular in a week. At this time I recommended aggressive offloading to the wound bed and Medihoney for dressing changes. He has a soft shoe to help with offloading  and I also recommended a phone donut pad. Once he is evaluated By [facility] and there is adequate blood flow he would benefit from a skin substitute. I will see him in follow-up in 2 Hood. 47 minutes is spent on the encounter including face-to-face, EMR review and coordination care Procedures Wound #1 Pre-procedure diagnosis of Wound #1 is a Diabetic Wound/Ulcer of the Lower Extremity located on the Right Calcaneus .Severity of Tissue Pre Debridement is: Fat layer exposed. There was a Chemical/Enzymatic/Mechanical debridement performed by Kenneth Shan, DO.. Other agent used was gauze and wound cleanser. A time out was conducted at 14:10, prior to the start of the procedure. There was no bleeding. The procedure was tolerated well. Post Debridement Measurements: 1.3cm length x 1.5cm width x 0.3cm depth; 0.459cm^3 volume. Character of Wound/Ulcer Post Debridement is stable. Severity of Tissue Post Debridement is: Fat layer exposed. Post procedure Diagnosis Wound #1: Same as Pre-Procedure Plan Follow-up Appointments: Return Appointment in 2 Hood. - 09/09/21 @ 11am with Dr. Heber [place] and Leveda Anna, RN (Room 7) Anesthetic: (In clinic) Topical Lidocaine 5% applied to wound bed (In clinic) Topical Lidocaine 4% applied to wound bed Cellular or Tissue Based Products: Other Cellular or Tissue Based Products Orders/Instructions: - Will consider after antibiotics and Vascular Appt. Bathing/ Shower/ Hygiene: May shower and wash wound with soap and water. Edema Control - Lymphedema / SCD / Other: Avoid standing for long periods of time. Moisturize legs daily. Off-Loading: Open toe surgical shoe to: Other: - Keep pressure off area as much as is possible Additional Orders / Instructions: Follow Nutritious Diet WOUND #1: - Calcaneus Wound Laterality: Right Cleanser: Soap and Water 1 x Per Day/30 Days Discharge Instructions: May shower and wash wound with dial antibacterial soap and water prior to dressing  change. Peri-Wound Care: Skin Prep (DME) (Generic) 1 x Per Day/30 Days Discharge Instructions: Use skin prep as directed Prim Dressing: MediHoney Gel, tube 1.5 (oz) 1 x Per Day/30 Days ary Discharge Instructions: Apply to wound bed as instructed Secondary Dressing: ALLEVYN Gentle Border, 3x3 (in/in) (DME) (Dispense As Written) 1 x Per Day/30 Days Discharge Instructions: Apply over primary dressing as directed. Secondary Dressing: Optifoam Non-Adhesive Dressing, 4x4 in 1 x Per Day/30 Days Discharge Instructions: Apply as donut over primary dressing as directed. Add-Ons: Cotton Tip Swabs (DME) (Generic) 1 x Per Day/30 Days 1. Medihoney 2. Aggressive offloading 3. Follow-up in 2 Hood 4. Continue antibiotics per ID Electronic Signature(s) Signed: 08/26/2021 3:51:23 PM By: Kenneth Shan DO Entered By: [name] on 08/26/2021 14:54:32 -------------------------------------------------------------------------------- HxROS Details Patient Name: Date of Service: Kenneth Mons MES L. 08/26/2021 1:15 PM Medical Record Number: [id number] Patient Account Number: [id number] Date of Birth/Sex: Treating RN: [birth year] (86 y.o. [name] Primary Care Provider: Dorthy Hood, Kenneth Other Clinician: Referring Provider: Treating Provider/Extender: [name], [name] in Treatment: 0 Information Obtained From Patient  Chart Eyes Complaints and Symptoms: Positive for: Glasses / Contacts - Reading Medical History: Positive for: Cataracts - Surgery Ear/Nose/Mouth/Throat Complaints and Symptoms: Negative for: Chronic sinus problems or rhinitis Respiratory Complaints and Symptoms: Negative for: Chronic or frequent coughs; Shortness of Breath Gastrointestinal Complaints and Symptoms: Negative for: Frequent diarrhea; Nausea; Vomiting Genitourinary Complaints and Symptoms: Negative for: Frequent urination Integumentary (Skin) Complaints and Symptoms: Positive for:  Wounds Neurologic Complaints and Symptoms: Negative for: Numbness/parasthesias Medical History: Positive for: Neuropathy Psychiatric Complaints and Symptoms: Negative for: Claustrophobia; Suicidal Hematologic/Lymphatic Cardiovascular Medical History: Positive for: Angina - AFib Past Medical History Notes: Hx CVA Endocrine Medical History: Positive for: Type II Diabetes Treated with: Oral agents, Diet Blood sugar tested every day: No Immunological Musculoskeletal Medical History: Positive for: Gout; Osteoarthritis Negative for: Rheumatoid Arthritis; Osteomyelitis Oncologic HBO Extended History Items Eyes: Cataracts Immunizations Pneumococcal Vaccine: Received Pneumococcal Vaccination: No Implantable Devices None Family and Social History Cancer: No; Diabetes: Yes - Child; Heart Disease: No; Hereditary Spherocytosis: No; Hypertension: No; Kidney Disease: No; Lung Disease: No; Seizures: No; Stroke: Yes - Mother; Thyroid Problems: No; Tuberculosis: No; Former smoker; Marital Status - Divorced; Alcohol Use: Moderate; Drug Use: Prior History; Caffeine Use: Daily; Financial Concerns: No; Food, Clothing or Shelter Needs: No; Support System Lacking: No; Transportation Concerns: No Electronic Signature(s) Signed: 08/26/2021 3:51:23 PM By: Kenneth Shan DO Signed: 08/26/2021 3:57:26 PM By: [name] Entered By: [name] on 08/26/2021 14:10:38 -------------------------------------------------------------------------------- SuperBill Details Patient Name: Date of Service: Kenneth Mons MES L. 08/26/2021 Medical Record Number: [id number] Patient Account Number: [id number] Date of Birth/Sex: Treating RN: [birth year] (86 y.o. [name] Primary Care Provider: Dorthy Hood, Kenneth Other Clinician: Referring Provider: Treating Provider/Extender: [name], [name] in Treatment: 0 Diagnosis Coding ICD-10 Codes Code Description E11.621 Type 2 diabetes  mellitus with foot ulcer I73.9 Peripheral vascular disease, unspecified M86.171 Other acute osteomyelitis, right ankle and foot E11.40 Type 2 diabetes mellitus with diabetic neuropathy, unspecified Facility Procedures CPT4 Code: [id number] Description: 99213 - WOUND CARE VISIT-LEV 3 EST PT Modifier: 25 Quantity: 1 Physician Procedures : CPT4 Code Description Modifier [id number] - WC PHYS LEVEL 4 - NEW PT ICD-10 Diagnosis Description E11.621 Type 2 diabetes mellitus with foot ulcer I73.9 Peripheral vascular disease, unspecified M86.171 Other acute osteomyelitis, right ankle and foot  E11.40 Type 2 diabetes mellitus with diabetic neuropathy, unspecified Quantity: 1 Electronic Signature(s) Signed: 08/26/2021 3:51:23 PM By: Kenneth Shan DO Previous Signature: 08/26/2021 2:41:17 PM Version By: [name] Entered By: [name] on 08/26/2021 14:54:51

## 2021-08-26 NOTE — Progress Notes (Signed)
Callens, Domnique L. ([id number]) Visit Report for 08/26/2021 Abuse Risk Screen Details Patient Name: Date of Service: RAFE, Kenneth MES L. 08/26/2021 1:15 PM Medical Record Number: [id number] Patient Account Number: [id number] Date of Birth/Sex: Treating RN: [birth year] (86 y.o. [name] Primary Care Glenda Spelman: Dorthy Cooler, [place]ib[place] Other Clinician: Referring Jermine Bibbee: Treating Vishwa Dais/Extender: Alanda Slim, [place]ib[place] Weeks in Treatment: 0 Abuse Risk Screen Items Answer ABUSE RISK SCREEN: Has anyone close to you tried to hurt or harm you recentlyo No Do you feel uncomfortable with anyone in your familyo No Has anyone forced you do things that you didnt want to doo No Electronic Signature(s) Signed: 08/26/2021 3:57:26 PM By: Lorrin Jackson Entered By: Lorrin Jackson on 08/26/2021 13:29:33 -------------------------------------------------------------------------------- Activities of Daily Living Details Patient Name: Date of Service: TAEVIN, [name] 08/26/2021 1:15 PM Medical Record Number: [id number] Patient Account Number: [id number] Date of Birth/Sex: Treating RN: October 27, 1935 (86 y.o. [name] Primary Care Leota Maka: Dorthy Cooler, Dibas Other Clinician: Referring Annalisa Colonna: Treating Zahniya Zellars/Extender: Alanda Slim, [place]ib[place] Weeks in Treatment: 0 Activities of Daily Living Items Answer Activities of Daily Living (Please select one for each item) Drive Automobile Not Able T Medications ake Completely Able Use T elephone Completely Able Care for Appearance Need Assistance Use T oilet Completely Able Bath / Shower Need Assistance Dress Self Completely Able Feed Self Completely Able Walk Need Assistance Get In / Out Bed Completely [place] for Self Need Assistance Electronic Signature(s) Signed: 08/26/2021 3:57:26 PM By: Lorrin Jackson Entered By: Lorrin Jackson on 08/26/2021  13:30:43 -------------------------------------------------------------------------------- Education Screening Details Patient Name: Date of Service: Kenneth Mons MES L. 08/26/2021 1:15 PM Medical Record Number: [id number] Patient Account Number: [id number] Date of Birth/Sex: Treating RN: 29-May-1935 (86 y.o. [name] Primary Care Analyse Angst: Dorthy Cooler, Dibas Other Clinician: Referring Kaia Depaolis: Treating Elize Pinon/Extender: Alanda Slim, Dibas Weeks in Treatment: 0 Primary Learner Assessed: Patient Learning Preferences/Education Level/Primary Language Learning Preference: Explanation, Demonstration, Printed Material Highest Education Level: High School Preferred Language: English Cognitive Barrier Language Barrier: No Translator Needed: No Memory Deficit: No Emotional Barrier: No Cultural/Religious Beliefs Affecting Medical Care: No Physical Barrier Impaired Vision: Yes Glasses Impaired Hearing: No Decreased Hand dexterity: No Knowledge/Comprehension Knowledge Level: High Comprehension Level: High Ability to understand written instructions: High Ability to understand verbal instructions: High Motivation Anxiety Level: Calm Cooperation: Cooperative Education Importance: Acknowledges Need Interest in Health Problems: Asks Questions Perception: Coherent Willingness to Engage in Self-Management High Activities: Readiness to Engage in Self-Management High Activities: Electronic Signature(s) Signed: 08/26/2021 3:57:26 PM By: Lorrin Jackson Entered By: Lorrin Jackson on 08/26/2021 13:31:21 -------------------------------------------------------------------------------- Fall Risk Assessment Details Patient Name: Date of Service: Kenneth Mons MES L. 08/26/2021 1:15 PM Medical Record Number: [id number] Patient Account Number: [id number] Date of Birth/Sex: Treating RN: 1935-06-26 (86 y.o. [name] Primary Care Arliene Rosenow: Dorthy Cooler, Dibas Other  Clinician: Referring [place]: Treating Kassidy Dockendorf/Extender: Alanda Slim, Dibas Weeks in Treatment: 0 Fall Risk Assessment Items Have you had 2 or more falls in the last 12 monthso 0 No Have you had any fall that resulted in injury in the last 12 monthso 0 No FALLS RISK SCREEN History of falling - immediate or within 3 months 0 No Secondary diagnosis (Do you have 2 or more medical diagnoseso) 15 Yes Ambulatory aid None/bed rest/wheelchair/nurse 0 No Crutches/cane/walker 15 Yes Furniture 0 No Intravenous therapy Access/Saline/Heparin Lock 0 No Gait/Transferring Normal/ bed rest/ wheelchair 0 No Weak (short steps with or without shuffle, stooped but  able to lift head while walking, may seek 10 Yes support from furniture) Impaired (short steps with shuffle, may have difficulty arising from chair, head down, impaired 0 No balance) Mental Status Oriented to own ability 0 Yes Electronic Signature(s) Signed: 08/26/2021 3:57:26 PM By: Lorrin Jackson Entered By: Lorrin Jackson on 08/26/2021 13:31:35 -------------------------------------------------------------------------------- Foot Assessment Details Patient Name: Date of Service: Kenneth Mons MES L. 08/26/2021 1:15 PM Medical Record Number: [id number] Patient Account Number: [id number] Date of Birth/Sex: Treating RN: 24-Aug-1935 (86 y.o. [name] Primary Care Nancie Bocanegra: Dorthy Cooler, Dibas Other Clinician: Referring Donyea Beverlin: Treating Boyde Grieco/Extender: Alanda Slim, Dibas Weeks in Treatment: 0 Foot Assessment Items Site Locations + = Sensation present, - = Sensation absent, C = Callus, U = Ulcer R = Redness, W = Warmth, M = Maceration, PU = Pre-ulcerative lesion F = Fissure, S = Swelling, D = Dryness Assessment Right: Left: Other Deformity: No No Prior Foot Ulcer: No No Prior Amputation: No No Charcot Joint: No No Ambulatory Status: Ambulatory With Help Assistance Device: Walker Gait:  Steady Electronic Signature(s) Signed: 08/26/2021 3:57:26 PM By: Lorrin Jackson Entered By: Lorrin Jackson on 08/26/2021 13:33:47 -------------------------------------------------------------------------------- Nutrition Risk Screening Details Patient Name: Date of Service: [name], Kenneth MES L. 08/26/2021 1:15 PM Medical Record Number: [id number] Patient Account Number: [id number] Date of Birth/Sex: Treating RN: [birth year] (86 y.o. [name] Primary Care Perla Echavarria: Dorthy Cooler, Dibas Other Clinician: Referring Deldrick Linch: Treating Windsor Goeken/Extender: Alanda Slim, Dibas Weeks in Treatment: 0 Height (in): 68 Weight (lbs): 198 Body Mass Index (BMI): 30.1 Nutrition Risk Screening Items Score Screening NUTRITION RISK SCREEN: I have an illness or condition that made me change the kind and/or amount of food I eat 0 No I eat fewer than two meals per day 0 No I eat few fruits and vegetables, or milk products 0 No I have three or more drinks of beer, liquor or wine almost every day 0 No I have tooth or mouth problems that make it hard for me to eat 0 No I don't always have enough money to buy the food I need 0 No I eat alone most of the time 0 No I take three or more different prescribed or over-the-counter drugs a day 1 Yes Without wanting to, I have lost or gained 10 pounds in the last six months 0 No I am not always physically able to shop, cook and/or feed myself 0 No Nutrition Protocols Good Risk Protocol 0 No interventions needed Moderate Risk Protocol High Risk Proctocol Risk Level: Good Risk Score: 1 Electronic Signature(s) Signed: 08/26/2021 3:57:26 PM By: Lorrin Jackson Entered By: Lorrin Jackson on 08/26/2021 13:31:43

## 2021-08-27 ENCOUNTER — Other Ambulatory Visit: Payer: Self-pay

## 2021-08-27 ENCOUNTER — Telehealth (INDEPENDENT_AMBULATORY_CARE_PROVIDER_SITE_OTHER): Payer: Medicare Other | Admitting: Internal Medicine

## 2021-08-27 ENCOUNTER — Encounter: Payer: Self-pay | Admitting: Internal Medicine

## 2021-08-27 DIAGNOSIS — M86171 Other acute osteomyelitis, right ankle and foot: Secondary | ICD-10-CM

## 2021-08-27 DIAGNOSIS — S91301A Unspecified open wound, right foot, initial encounter: Secondary | ICD-10-CM | POA: Diagnosis not present

## 2021-08-27 NOTE — Progress Notes (Signed)
   Subjective:    Patient ID: [name], male    DOB: [birth year], 86 y.o.   MRN: [id number]  I connected with  [name] on 08/27/21 by a video enabled telemedicine application and verified that I am speaking with the correct person using two identifiers.   I discussed the limitations of evaluation and management by telemedicine. The patient expressed understanding and agreed to proceed.  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  He is followed by Dr. Jacqualyn Posey who did a debridement and bone biopsy on 8/9 and significant for MRSA and he has been on doxyxycline.  He had been on cefadroxil but this has been stopped with the culture result and issues with GI tolerance.   Overall stable and he is also followed by wound care, Dr. Heber Greenwood.  He has follow up with vascular surgery as well for evaluation.   Daughter also present on the visit.    Review of Systems  Constitutional:  Negative for chills and fever.  Gastrointestinal:  Negative for abdominal pain and nausea.       Objective:   Physical Exam Pulmonary:     Effort: Pulmonary effort is normal.  Neurological:     Mental Status: He is alert.           Assessment & Plan:

## 2021-08-27 NOTE — Assessment & Plan Note (Signed)
Seems to be doing well overall with no new issues.  Cultures noted and discussed with Dr. Jacqualyn Posey.  Will plan on 6 weeks of doxycycline through September 16 then he can stop and observe off of antibiotics.  surg path was negative for ostoemyelitis but with the positive culture from the bone, still of concern.    Otherwise he will follow up as needed

## 2021-08-28 ENCOUNTER — Telehealth: Payer: Self-pay | Admitting: Podiatry

## 2021-08-28 ENCOUNTER — Other Ambulatory Visit: Payer: Medicare Other

## 2021-08-28 NOTE — Telephone Encounter (Signed)
Pts daughter called pt has an appt today but has been up all night sick ( throwing up) he is on the nurse schedule and we do not have any nurse appts available for next week. It is his 2nd pov and pts daughter was asking if pt needs to be seen as he was seen at wound care yesterday and it was rewrapped. If they do when can I add to the nurse schedule.

## 2021-08-28 NOTE — Telephone Encounter (Signed)
Pts daughter returned my call and per Dr Jacqualyn Posey I can put them in at 800am one day next week. He had a 830 on hold so I put pt there and added to waitlist to get in later in the day if possible.

## 2021-09-01 ENCOUNTER — Ambulatory Visit (INDEPENDENT_AMBULATORY_CARE_PROVIDER_SITE_OTHER): Payer: Medicare Other | Admitting: Podiatry

## 2021-09-01 DIAGNOSIS — L97412 Non-pressure chronic ulcer of right heel and midfoot with fat layer exposed: Secondary | ICD-10-CM

## 2021-09-01 DIAGNOSIS — M86071 Acute hematogenous osteomyelitis, right ankle and foot: Secondary | ICD-10-CM

## 2021-09-01 NOTE — Telephone Encounter (Signed)
error 

## 2021-09-01 NOTE — Progress Notes (Unsigned)
Subjective: Chief Complaint  Patient presents with   Foot Ulcer    Right heel ulcer, Patient deines any N/V/F/C/SOB, pt is  doing well    He saw ID last week and plan is to continue doxycyline  09/20/2021.  He is followed at the wound care center as well.  Continue with dressing changes.  Denies any fevers or chills.  Objective: AAO x3, NAD DP/PT pulses palpable bilaterally, CRT less than 3 seconds Ulceration still present on the posterior aspect of the right heel with a granular base without any probing, or tunneling.  There is minimal edema there is no erythema or warmth there is no drainage or pus.  No fluctuation or crepitation but there is malodor. No pain with calf compression, swelling, warmth, erythema  Assessment: Ulceration, osteomyelitis  Plan: -All treatment options discussed with the patient including all alternatives, risks, complications.  -With the suture stable complications.  The incision is well coapted.  At this point he is already followed up with wound care center as well as infectious disease.  I will defer to the wound care center for further treatment of the wound at this time.  Offloading. -Monitor for any clinical signs or symptoms of infection and directed to call the office immediately should any occur or go to the ER. -Patient encouraged to call the office with any questions, concerns, change in symptoms.   Trula Slade DPM

## 2021-09-02 DIAGNOSIS — S91301A Unspecified open wound, right foot, initial encounter: Secondary | ICD-10-CM | POA: Diagnosis not present

## 2021-09-03 ENCOUNTER — Encounter: Payer: Medicare Other | Admitting: Vascular Surgery

## 2021-09-09 ENCOUNTER — Encounter (HOSPITAL_BASED_OUTPATIENT_CLINIC_OR_DEPARTMENT_OTHER): Payer: Medicare Other | Attending: Internal Medicine | Admitting: Internal Medicine

## 2021-09-09 DIAGNOSIS — M86171 Other acute osteomyelitis, right ankle and foot: Secondary | ICD-10-CM | POA: Diagnosis not present

## 2021-09-09 DIAGNOSIS — E1151 Type 2 diabetes mellitus with diabetic peripheral angiopathy without gangrene: Secondary | ICD-10-CM | POA: Insufficient documentation

## 2021-09-09 DIAGNOSIS — I739 Peripheral vascular disease, unspecified: Secondary | ICD-10-CM

## 2021-09-09 DIAGNOSIS — Z87891 Personal history of nicotine dependence: Secondary | ICD-10-CM | POA: Insufficient documentation

## 2021-09-09 DIAGNOSIS — E11621 Type 2 diabetes mellitus with foot ulcer: Secondary | ICD-10-CM | POA: Diagnosis not present

## 2021-09-09 DIAGNOSIS — L97512 Non-pressure chronic ulcer of other part of right foot with fat layer exposed: Secondary | ICD-10-CM | POA: Diagnosis not present

## 2021-09-09 DIAGNOSIS — E1169 Type 2 diabetes mellitus with other specified complication: Secondary | ICD-10-CM | POA: Insufficient documentation

## 2021-09-09 DIAGNOSIS — E1142 Type 2 diabetes mellitus with diabetic polyneuropathy: Secondary | ICD-10-CM | POA: Diagnosis not present

## 2021-09-09 DIAGNOSIS — I4891 Unspecified atrial fibrillation: Secondary | ICD-10-CM | POA: Insufficient documentation

## 2021-09-09 DIAGNOSIS — E1140 Type 2 diabetes mellitus with diabetic neuropathy, unspecified: Secondary | ICD-10-CM | POA: Diagnosis not present

## 2021-09-09 DIAGNOSIS — Z8614 Personal history of Methicillin resistant Staphylococcus aureus infection: Secondary | ICD-10-CM | POA: Insufficient documentation

## 2021-09-09 NOTE — Progress Notes (Signed)
Fleeger, Wilberto L. ([id number]) Visit Report for 09/09/2021 Arrival Information Details Patient Name: Date of Service: [name], Kenneth MES L. 09/09/2021 12:30 PM Medical Record Number: [id number] Patient Account Number: [id number] Date of Birth/Sex: Treating RN: November 29, 1935 (86 y.o. [name] Primary Care Copeland Neisen: Dorthy Cooler, Dibas Other Clinician: Referring Zarielle Cea: Treating Ellijah Leffel/Extender: Alanda Slim, Dibas Weeks in Treatment: 2 Visit Information History Since Last Visit All ordered tests and consults were completed: Yes Patient Arrived: Gilford Rile Added or deleted any medications: No Arrival Time: 12:39 Any new allergies or adverse reactions: No Transfer Assistance: [name] Had a fall or experienced change in No Patient Identification Verified: Yes activities of daily living that may affect Secondary Verification Process Completed: Yes risk of falls: Patient Requires Transmission-Based Precautions: No Signs or symptoms of abuse/neglect since last visito No Patient Has Alerts: Yes Hospitalized since last visit: No Patient Alerts: Patient on Blood Thinner Implantable device outside of the clinic excluding No ABI R=.58 TBI R=[place] cellular tissue based products placed in the center since last visit: Has Dressing in Place as Prescribed: Yes Pain Present Now: No Electronic Signature(s) Signed: 09/09/2021 4:38:31 PM By: Lorrin Jackson Entered By: Lorrin Jackson on 09/09/2021 12:44:40 -------------------------------------------------------------------------------- Clinic Level of Care Assessment Details Patient Name: Date of Service: [name], Kenneth MES L. 09/09/2021 12:30 PM Medical Record Number: [id number] Patient Account Number: [id number] Date of Birth/Sex: Treating RN: 1935-08-21 (86 y.o. [name] Primary Care Yusif Gnau: Dorthy Cooler, Dibas Other Clinician: Referring Vanessia Bokhari: Treating Mychelle Kendra/Extender: Alanda Slim, Dibas Weeks in Treatment: 2 Clinic Level of Care  Assessment Items TOOL 4 Quantity Score X- 1 0 Use when only an EandM is performed on FOLLOW-UP visit ASSESSMENTS - Nursing Assessment / Reassessment X- 1 10 Reassessment of Co-morbidities (includes updates in patient status) X- 1 5 Reassessment of Adherence to Treatment Plan ASSESSMENTS - Wound and Skin A ssessment / Reassessment X - Simple Wound Assessment / Reassessment - one wound 1 5 '[]'$  - 0 Complex Wound Assessment / Reassessment - multiple wounds '[]'$  - 0 Dermatologic / Skin Assessment (not related to wound area) ASSESSMENTS - Focused Assessment '[]'$  - 0 Circumferential Edema Measurements - multi extremities '[]'$  - 0 Nutritional Assessment / Counseling / Intervention '[]'$  - 0 Lower Extremity Assessment (monofilament, tuning fork, pulses) '[]'$  - 0 Peripheral Arterial Disease Assessment (using hand held doppler) ASSESSMENTS - Ostomy and/or Continence Assessment and Care '[]'$  - 0 Incontinence Assessment and Management '[]'$  - 0 Ostomy Care Assessment and Management (repouching, etc.) PROCESS - Coordination of Care '[]'$  - 0 Simple Patient / Family Education for ongoing care X- 1 20 Complex (extensive) Patient / Family Education for ongoing care X- 1 10 Staff obtains Programmer, systems, Records, T Results / Process Orders est '[]'$  - 0 Staff telephones HHA, Nursing Homes / Clarify orders / etc '[]'$  - 0 Routine Transfer to another Facility (non-emergent condition) '[]'$  - 0 Routine Hospital Admission (non-emergent condition) '[]'$  - 0 New Admissions / Biomedical engineer / Ordering NPWT Apligraf, etc. , '[]'$  - 0 Emergency Hospital Admission (emergent condition) '[]'$  - 0 Simple Discharge Coordination '[]'$  - 0 Complex (extensive) Discharge Coordination PROCESS - Special Needs '[]'$  - 0 Pediatric / Minor Patient Management '[]'$  - 0 Isolation Patient Management '[]'$  - 0 Hearing / Language / Visual special needs '[]'$  - 0 Assessment of Community assistance (transportation, D/C planning, etc.) '[]'$  -  0 Additional assistance / Altered mentation '[]'$  - 0 Support Surface(s) Assessment (bed, cushion, seat, etc.) INTERVENTIONS - Wound Cleansing / Measurement X - Simple Wound Cleansing - one  wound 1 5 '[]'$  - 0 Complex Wound Cleansing - multiple wounds X- 1 5 Wound Imaging (photographs - any number of wounds) '[]'$  - 0 Wound Tracing (instead of photographs) X- 1 5 Simple Wound Measurement - one wound '[]'$  - 0 Complex Wound Measurement - multiple wounds INTERVENTIONS - Wound Dressings X - Small Wound Dressing one or multiple wounds 1 10 '[]'$  - 0 Medium Wound Dressing one or multiple wounds '[]'$  - 0 Large Wound Dressing one or multiple wounds '[]'$  - 0 Application of Medications - topical '[]'$  - 0 Application of Medications - injection INTERVENTIONS - Miscellaneous '[]'$  - 0 External ear exam '[]'$  - 0 Specimen Collection (cultures, biopsies, blood, body fluids, etc.) '[]'$  - 0 Specimen(s) / Culture(s) sent or taken to Lab for analysis '[]'$  - 0 Patient Transfer (multiple staff / Civil Service fast streamer / Similar devices) '[]'$  - 0 Simple Staple / Suture removal (25 or less) '[]'$  - 0 Complex Staple / Suture removal (26 or more) '[]'$  - 0 Hypo / Hyperglycemic Management (close monitor of Blood Glucose) '[]'$  - 0 Ankle / Brachial Index (ABI) - do not check if billed separately X- 1 5 Vital Signs Has the patient been seen at the hospital within the last three years: Yes Total Score: 80 Level Of Care: New/Established - Level 3 Electronic Signature(s) Signed: 09/09/2021 4:38:31 PM By: Lorrin Jackson Entered By: Lorrin Jackson on 09/09/2021 13:09:48 -------------------------------------------------------------------------------- Encounter Discharge Information Details Patient Name: Date of Service: Kenneth Mons MES L. 09/09/2021 12:30 PM Medical Record Number: [id number] Patient Account Number: [id number] Date of Birth/Sex: Treating RN: 1935/07/22 (86 y.o. [name] Primary Care Quantez Schnyder: Dorthy Cooler, Dibas Other  Clinician: Referring Kyanna Mahrt: Treating Farrell Broerman/Extender: Alanda Slim, Dibas Weeks in Treatment: 2 Encounter Discharge Information Items Discharge Condition: Stable Ambulatory Status: Walker Discharge Destination: Home Transportation: Private Auto Schedule Follow-up Appointment: Yes Clinical Summary of Care: Provided on 09/09/2021 Form Type Recipient Paper Patient Patient Electronic Signature(s) Signed: 09/09/2021 4:38:31 PM By: Lorrin Jackson Entered By: Lorrin Jackson on 09/09/2021 13:10:33 -------------------------------------------------------------------------------- Lower Extremity Assessment Details Patient Name: Date of Service: Kenneth Mons MES L. 09/09/2021 12:30 PM Medical Record Number: [id number] Patient Account Number: [id number] Date of Birth/Sex: Treating RN: [birth year] (86 y.o. [name] Primary Care Irby Fails: Dorthy Cooler, Dibas Other Clinician: Referring Kema Santaella: Treating Takasha Vetere/Extender: Alanda Slim, Dibas Weeks in Treatment: 2 Edema Assessment Assessed: [Left: No] [Right: Yes] Edema: [Left: Ye] [Right: s] Calf Left: Right: Point of Measurement: 42 cm From Medial Instep 36 cm Ankle Left: Right: Point of Measurement: 8 cm From Medial Instep 21.4 cm Vascular Assessment Pulses: Dorsalis Pedis Palpable: [Right:Yes Yes] Electronic Signature(s) Signed: 09/09/2021 4:38:31 PM By: Lorrin Jackson Entered By: Lorrin Jackson on 09/09/2021 12:47:31 -------------------------------------------------------------------------------- Multi Wound Chart Details Patient Name: Date of Service: Kenneth Mons MES L. 09/09/2021 12:30 PM Medical Record Number: [id number] Patient Account Number: [id number] Date of Birth/Sex: Treating RN: 1935-01-17 (86 y.o. [name] Primary Care Leiloni Smithers: Dorthy Cooler, Dibas Other Clinician: Referring Shayna Eblen: Treating Muath Hallam/Extender: Alanda Slim, Dibas Weeks in Treatment: 2 Vital Signs Height(in):  68 Pulse(bpm): 114 Weight(lbs): 198 Blood Pressure(mmHg): 124/80 Body Mass Index(BMI): 30.1 Temperature(F): 97.9 Respiratory Rate(breaths/min): 20 Photos: [N/A:N/A] Right Calcaneus N/A N/A Wound Location: Gradually Appeared N/A N/A Wounding Event: Diabetic Wound/Ulcer of the Lower N/A N/A Primary Etiology: Extremity Cataracts, Angina, Type II Diabetes, N/A N/A Comorbid History: Gout, Osteoarthritis, Neuropathy 06/05/2021 N/A N/A Date Acquired: 2 N/A N/A Weeks of Treatment: Open N/A N/A Wound Status: No N/A N/A Wound Recurrence: 1.8x1.3x0.3 N/A N/A Measurements L  x W x D (cm) 1.838 N/A N/A A (cm) : rea 0.551 N/A N/A Volume (cm) : -20.00% N/A N/A % Reduction in A rea: -20.00% N/A N/A % Reduction in Volume: Grade 1 N/A N/A Classification: Medium N/A N/A Exudate A mount: Serosanguineous N/A N/A Exudate Type: red, brown N/A N/A Exudate Color: Well defined, not attached N/A N/A Wound Margin: Large (67-100%) N/A N/A Granulation A mount: Red N/A N/A Granulation Quality: Small (1-33%) N/A N/A Necrotic A mount: Fat Layer (Subcutaneous Tissue): Yes N/A N/A Exposed Structures: Fascia: No Tendon: No Muscle: No Joint: No Bone: No [name] N/A N/A Epithelialization: Slight maceration N/A N/A Assessment Notes: Treatment Notes Wound #1 (Calcaneus) Wound Laterality: Right Cleanser Soap and Water Discharge Instruction: May shower and wash wound with dial antibacterial soap and water prior to dressing change. Peri-Wound Care Skin Prep Discharge Instruction: Use skin prep as directed Topical Primary Dressing MediHoney Gel, tube 1.5 (oz) Discharge Instruction: Apply to wound bed as instructed Secondary Dressing ALLEVYN Gentle Border, 3x3 (in/in) Discharge Instruction: Apply over primary dressing as directed. Secured With Compression Wrap Compression Stockings Add-Ons Cotton Tip Swabs Electronic Signature(s) Signed: 09/09/2021 4:32:10 PM By: Kalman Shan  DO Signed: 09/09/2021 4:38:31 PM By: Fara Chute By: Kalman Shan on 09/09/2021 13:13:37 -------------------------------------------------------------------------------- Multi-Disciplinary Care Plan Details Patient Name: Date of Service: Kenneth Mons MES L. 09/09/2021 12:30 PM Medical Record Number: [id number] Patient Account Number: [id number] Date of Birth/Sex: Treating RN: 1935/04/25 (86 y.o. [name] Primary Care Akeria Hedstrom: Dorthy Cooler, Dibas Other Clinician: Referring Maelyn Berrey: Treating Aalliyah Kilker/Extender: Alanda Slim, Dibas Weeks in Treatment: 2 Active Inactive Pressure Nursing Diagnoses: Knowledge deficit related to management of pressures ulcers Goals: Patient/caregiver will verbalize understanding of pressure ulcer management Date Initiated: 08/26/2021 Target Resolution Date: 09/23/2021 Goal Status: Active Interventions: Assess: immobility, friction, shearing, incontinence upon admission and as needed Assess offloading mechanisms upon admission and as needed Provide education on pressure ulcers Notes: Wound/Skin Impairment Nursing Diagnoses: Impaired tissue integrity Goals: Patient/caregiver will verbalize understanding of skin care regimen Date Initiated: 08/26/2021 Target Resolution Date: 09/23/2021 Goal Status: Active Ulcer/skin breakdown will have a volume reduction of 30% by week 4 Date Initiated: 08/26/2021 Target Resolution Date: 09/23/2021 Goal Status: Active Interventions: Assess patient/caregiver ability to obtain necessary supplies Assess patient/caregiver ability to perform ulcer/skin care regimen upon admission and as needed Assess ulceration(s) every visit Provide education on ulcer and skin care Treatment Activities: Topical wound management initiated : 08/26/2021 Notes: Electronic Signature(s) Signed: 09/09/2021 4:38:31 PM By: Lorrin Jackson Entered By: Lorrin Jackson on 09/09/2021  12:39:07 -------------------------------------------------------------------------------- Pain Assessment Details Patient Name: Date of Service: Kenneth Mons MES L. 09/09/2021 12:30 PM Medical Record Number: [id number] Patient Account Number: [id number] Date of Birth/Sex: Treating RN: 08-Nov-1935 (86 y.o. [name] Primary Care Nakoma Gotwalt: Dorthy Cooler, Dibas Other Clinician: Referring Shatoria Stooksbury: Treating Neyah Ellerman/Extender: Alanda Slim, Dibas Weeks in Treatment: 2 Active Problems Location of Pain Severity and Description of Pain Patient Has Paino No Site Locations Pain Management and Medication Current Pain Management: Electronic Signature(s) Signed: 09/09/2021 4:38:31 PM By: Lorrin Jackson Entered By: Lorrin Jackson on 09/09/2021 12:46:34 -------------------------------------------------------------------------------- Patient/Caregiver Education Details Patient Name: Date of Service: Kenneth Mons MES L. 9/5/2023andnbsp12:30 PM Medical Record Number: [id number] Patient Account Number: [id number] Date of Birth/Gender: Treating RN: [birth year] (86 y.o. [name] Primary Care Physician: Dorthy Cooler, Dibas Other Clinician: Referring Physician: Treating Physician/Extender: Alanda Slim, Dibas Weeks in Treatment: 2 Education Assessment Education Provided To: Patient Education Topics Provided Pressure: Methods: Explain/Verbal, Printed Responses: State content correctly Wound/Skin Impairment:  Methods: Demonstration, Explain/Verbal, Printed Responses: State content correctly Electronic Signature(s) Signed: 09/09/2021 4:38:31 PM By: Lorrin Jackson Entered By: Lorrin Jackson on 09/09/2021 12:39:27 -------------------------------------------------------------------------------- Wound Assessment Details Patient Name: Date of Service: Kenneth Mons MES L. 09/09/2021 12:30 PM Medical Record Number: [id number] Patient Account Number: [id number] Date of  Birth/Sex: Treating RN: 1935-07-28 (86 y.o. [name] Primary Care Nessie Nong: Dorthy Cooler, Dibas Other Clinician: Referring Yazlyn Wentzel: Treating Maquita Sandoval/Extender: Alanda Slim, Dibas Weeks in Treatment: 2 Wound Status Wound Number: 1 Primary Diabetic Wound/Ulcer of the Lower Extremity Etiology: Wound Location: Right Calcaneus Wound Status: Open Wounding Event: Gradually Appeared Comorbid Cataracts, Angina, Type II Diabetes, Gout, Osteoarthritis, Date Acquired: 06/05/2021 History: Neuropathy Weeks Of Treatment: 2 Clustered Wound: No Photos Wound Measurements Length: (cm) 1.8 Width: (cm) 1.3 Depth: (cm) 0.3 Area: (cm) 1.838 Volume: (cm) 0.551 % Reduction in Area: -20% % Reduction in Volume: -20% Epithelialization: [name] Tunneling: No Undermining: No Wound Description Classification: Grade 1 Wound Margin: Well defined, not attached Exudate Amount: Medium Exudate Type: Serosanguineous Exudate Color: red, brown Foul Odor After Cleansing: No Slough/Fibrino Yes Wound Bed Granulation Amount: Large (67-100%) Exposed Structure Granulation Quality: Red Fascia Exposed: No Necrotic Amount: Small (1-33%) Fat Layer (Subcutaneous Tissue) Exposed: Yes Necrotic Quality: Adherent Slough Tendon Exposed: No Muscle Exposed: No Joint Exposed: No Bone Exposed: No Assessment Notes Slight maceration Treatment Notes Wound #1 (Calcaneus) Wound Laterality: Right Cleanser Soap and Water Discharge Instruction: May shower and wash wound with dial antibacterial soap and water prior to dressing change. Peri-Wound Care Skin Prep Discharge Instruction: Use skin prep as directed Topical Primary Dressing MediHoney Gel, tube 1.5 (oz) Discharge Instruction: Apply to wound bed as instructed Secondary Dressing ALLEVYN Gentle Border, 3x3 (in/in) Discharge Instruction: Apply over primary dressing as directed. Secured With Compression Wrap Compression Stockings Add-Ons Cotton  Tip Swabs Electronic Signature(s) Signed: 09/09/2021 4:38:31 PM By: Lorrin Jackson Signed: 09/09/2021 6:08:07 PM By: Deon Pilling RN, BSN Entered By: Deon Pilling on 09/09/2021 12:52:02 -------------------------------------------------------------------------------- Vitals Details Patient Name: Date of Service: Kenneth Mons MES L. 09/09/2021 12:30 PM Medical Record Number: [id number] Patient Account Number: [id number] Date of Birth/Sex: Treating RN: 22-Feb-1935 (86 y.o. [name] Primary Care Karisha Marlin: Dorthy Cooler, Dibas Other Clinician: Referring Ladavion Savitz: Treating Quinterrius Errington/Extender: Alanda Slim, Dibas Weeks in Treatment: 2 Vital Signs Time Taken: 12:44 Temperature (F): 97.9 Height (in): 68 Pulse (bpm): 114 Weight (lbs): 198 Respiratory Rate (breaths/min): 20 Body Mass Index (BMI): 30.1 Blood Pressure (mmHg): 124/80 Reference Range: 80 - 120 mg / dl Electronic Signature(s) Signed: 09/09/2021 4:38:31 PM By: Lorrin Jackson Entered By: Lorrin Jackson on 09/09/2021 12:46:14

## 2021-09-09 NOTE — Progress Notes (Signed)
Hood, Kenneth L. ([id number]) Visit Report for 09/09/2021 Chief Complaint Document Details Patient Name: Date of Service: [name], Kenneth MES L. 09/09/2021 12:30 PM Medical Record Number: [id number] Patient Account Number: [id number] Date of Birth/Sex: Treating RN: 03-24-1935 (86 y.o. [name] Primary Care Provider: Dorthy Cooler, [place] Other Clinician: Referring Provider: Treating Provider/Extender: Alanda Slim, [place] Weeks in Treatment: 2 Information Obtained from: Patient Chief Complaint 08/26/2021; right foot wound Electronic Signature(s) Signed: 09/09/2021 4:32:10 PM By: Kalman Shan DO Entered By: Kalman Shan on 09/09/2021 13:13:53 -------------------------------------------------------------------------------- HPI Details Patient Name: Date of Service: [name] MES L. 09/09/2021 12:30 PM Medical Record Number: [id number] Patient Account Number: [id number] Date of Birth/Sex: Treating RN: 1935-11-13 (86 y.o. [name] Primary Care Provider: Dorthy Cooler, [place] Other Clinician: Referring Provider: Treating Provider/Extender: Alanda Slim, [place] Weeks in Treatment: 2 History of Present Illness HPI Description: Admission 08/26/2021 [name] is an 86 year old male with a past medical history of controlled type 2 diabetes on oral agents with peripheral neuropathy and Atrial fibrillation not on blood thinners that presents to the clinic with a right foot wound that has waxed and waned in healing over the past year. He has peripheral neuropathy and is not sure when the wound started or how it started. He had an MRI done by his primary care physician on 08/03/2021 that showed potential early acute osteomyelitis at the posterior calcaneus. He was seen by Dr. Linus Salmons on 08/06/2021 and started on doxycycline and cefadroxil due to these findings. He was evaluated by podiatry, Dr. Jacqualyn Posey and had a bone biopsy done on 08/13/2021 that did not show evidence of  osteomyelitis. He had a culture that showed MRSA sensitive to doxycycline. He also had ABIs with TBI's done on 8/2 that showed an ABI of 0.58 and a noncompressible TBI. He is scheduled to see vein and vascular on 8/30. Currently he is keeping the wound covered. He is using a soft surgical shoe. He denies signs of infection. 8/5; patient presents for follow-up. He had follow-up with Dr. Linus Salmons On 8/23. Plan is for 6 weeks of doxycycline through September 16. Although path was negative for osteo there is still concern due to positive culture from the bone. Unfortunately patient missed his vein and vascular appointment to assess blood flow status. This has been rescheduled for 9/8. Patient has been using Medihoney to the wound bed. He tries to offload the wound bed but has trouble at night keeping pressure off of it. He does not have Prevalon boots. He currently denies signs of infection. Electronic Signature(s) Signed: 09/09/2021 4:32:10 PM By: Kalman Shan DO Entered By: Kalman Shan on 09/09/2021 13:15:33 -------------------------------------------------------------------------------- Physical Exam Details Patient Name: Date of Service: [name] MES L. 09/09/2021 12:30 PM Medical Record Number: [id number] Patient Account Number: [id number] Date of Birth/Sex: Treating RN: [birth year] (86 y.o. [name] Primary Care Provider: Dorthy Cooler, [place] Other Clinician: Referring Provider: Treating Provider/Extender: Alanda Slim, [place] Weeks in Treatment: 2 Constitutional respirations regular, non-labored and within target range for patient.Marland Kitchen Psychiatric pleasant and cooperative. Notes Right foot: T the calcaneus there is a circular wound with granulation tissue throughout. No surrounding signs of infection. o Electronic Signature(s) Signed: 09/09/2021 4:32:10 PM By: Kalman Shan DO Entered By: Kalman Shan on 09/09/2021  13:16:29 -------------------------------------------------------------------------------- Physician Orders Details Patient Name: Date of Service: [name] MES L. 09/09/2021 12:30 PM Medical Record Number: [id number] Patient Account Number: [id number] Date of Birth/Sex: Treating RN: 03/02/1935 (86 y.o. [name] Primary Care Provider: Dorthy Cooler, [place] Other  Clinician: Referring Provider: Treating Provider/Extender: Alanda Slim, [place] Weeks in Treatment: 2 Verbal / Phone Orders: No Diagnosis Coding ICD-10 Coding Code Description E11.621 Type 2 diabetes mellitus with foot ulcer I73.9 Peripheral vascular disease, unspecified M86.171 Other acute osteomyelitis, right ankle and foot E11.40 Type 2 diabetes mellitus with diabetic neuropathy, unspecified Follow-up Appointments ppointment in 2 weeks. - 09/23/21 @ 12:30pm with Dr. Heber [place] and Leveda Anna, RN (Room 7) Return A Anesthetic (In clinic) Topical Lidocaine 5% applied to wound bed (In clinic) Topical Lidocaine 4% applied to wound bed Cellular or Tissue Based Products Other Cellular or Tissue Based Products Orders/Instructions: - Will run IVR for Organogenesis products. Bathing/ Shower/ Hygiene May shower and wash wound with soap and water. Edema Control - Lymphedema / SCD / Other Avoid standing for long periods of time. Moisturize legs daily. Off-Loading Open toe surgical shoe to: - Wear socks with your shoes Other: - -Keep pressure off area as much as is possible -Will order Prevalon Boot Additional Orders / Instructions Follow Nutritious Diet Wound Treatment Wound #1 - Calcaneus Wound Laterality: Right Cleanser: Soap and Water 1 x Per Day/30 Days Discharge Instructions: May shower and wash wound with dial antibacterial soap and water prior to dressing change. Peri-Wound Care: Skin Prep (Generic) 1 x Per Day/30 Days Discharge Instructions: Use skin prep as directed Prim Dressing: MediHoney Gel, tube 1.5 (oz) 1 x Per  Day/30 Days ary Discharge Instructions: Apply to wound bed as instructed Secondary Dressing: ALLEVYN Gentle Border, 3x3 (in/in) (Dispense As Written) 1 x Per Day/30 Days Discharge Instructions: Apply over primary dressing as directed. Add-Ons: Cotton Tip Swabs (Generic) 1 x Per Day/30 Days Electronic Signature(s) Signed: 09/09/2021 4:32:10 PM By: Kalman Shan DO Entered By: Kalman Shan on 09/09/2021 13:16:37 -------------------------------------------------------------------------------- Problem List Details Patient Name: Date of Service: [name] MES L. 09/09/2021 12:30 PM Medical Record Number: [id number] Patient Account Number: [id number] Date of Birth/Sex: Treating RN: March 13, 1935 (86 y.o. [name] Primary Care Provider: Dorthy Cooler, [place] Other Clinician: Referring Provider: Treating Provider/Extender: Alanda Slim, [place] Weeks in Treatment: 2 Active Problems ICD-10 Encounter Code Description Active Date MDM Diagnosis E11.621 Type 2 diabetes mellitus with foot ulcer 08/26/2021 No Yes I73.9 Peripheral vascular disease, unspecified 08/26/2021 No Yes M86.171 Other acute osteomyelitis, right ankle and foot 08/26/2021 No Yes E11.40 Type 2 diabetes mellitus with diabetic neuropathy, unspecified 08/26/2021 No Yes Inactive Problems Resolved Problems Electronic Signature(s) Signed: 09/09/2021 4:32:10 PM By: Kalman Shan DO Entered By: Kalman Shan on 09/09/2021 13:12:57 -------------------------------------------------------------------------------- Progress Note Details Patient Name: Date of Service: [name] MES L. 09/09/2021 12:30 PM Medical Record Number: [id number] Patient Account Number: [id number] Date of Birth/Sex: Treating RN: [birth year] (86 y.o. [name] Primary Care Provider: Dorthy Cooler, [place] Other Clinician: Referring Provider: Treating Provider/Extender: Alanda Slim, [place] Weeks in Treatment: 2 Subjective Chief  Complaint Information obtained from Patient 08/26/2021; right foot wound History of Present Illness (HPI) Admission 08/26/2021 Mr. Oliverio Cho is an 86 year old male with a past medical history of controlled type 2 diabetes on oral agents with peripheral neuropathy and Atrial fibrillation not on blood thinners that presents to the clinic with a right foot wound that has waxed and waned in healing over the past year. He has peripheral neuropathy and is not sure when the wound started or how it started. He had an MRI done by his primary care physician on 08/03/2021 that showed potential early acute osteomyelitis at the posterior calcaneus. He was seen by Dr. Linus Salmons on 08/06/2021 and started on  doxycycline and cefadroxil due to these findings. He was evaluated by podiatry, Dr. Jacqualyn Posey and had a bone biopsy done on 08/13/2021 that did not show evidence of osteomyelitis. He had a culture that showed MRSA sensitive to doxycycline. He also had ABIs with TBI's done on 8/2 that showed an ABI of 0.58 and a noncompressible TBI. He is scheduled to see vein and vascular on 8/30. Currently he is keeping the wound covered. He is using a soft surgical shoe. He denies signs of infection. 8/5; patient presents for follow-up. He had follow-up with Dr. Linus Salmons On 8/23. Plan is for 6 weeks of doxycycline through September 16. Although path was negative for osteo there is still concern due to positive culture from the bone. Unfortunately patient missed his vein and vascular appointment to assess blood flow status. This has been rescheduled for 9/8. Patient has been using Medihoney to the wound bed. He tries to offload the wound bed but has trouble at night keeping pressure off of it. He does not have Prevalon boots. He currently denies signs of infection. Patient History Information obtained from Patient, Chart. Family History Diabetes - Child, Stroke - Mother, No family history of Cancer, Heart Disease, Hereditary  Spherocytosis, Hypertension, Kidney Disease, Lung Disease, Seizures, Thyroid Problems, Tuberculosis. Social History Former smoker, Marital Status - Divorced, Alcohol Use - Moderate, Drug Use - Prior History, Caffeine Use - Daily. Medical History Eyes Patient has history of Cataracts - Surgery Cardiovascular Patient has history of Angina - AFib Endocrine Patient has history of Type II Diabetes Musculoskeletal Patient has history of Gout, Osteoarthritis Denies history of Rheumatoid Arthritis, Osteomyelitis Neurologic Patient has history of Neuropathy Medical A Surgical History Notes nd Cardiovascular Hx CVA Objective Constitutional respirations regular, non-labored and within target range for patient.. Vitals Time Taken: 12:44 PM, Height: 68 in, Weight: 198 lbs, BMI: 30.1, Temperature: 97.9 F, Pulse: 114 bpm, Respiratory Rate: 20 breaths/min, Blood Pressure: 124/80 mmHg. Psychiatric pleasant and cooperative. General Notes: Right foot: T the calcaneus there is a circular wound with granulation tissue throughout. No surrounding signs of infection. o Integumentary (Hair, Skin) Wound #1 status is Open. Original cause of wound was Gradually Appeared. The date acquired was: 06/05/2021. The wound has been in treatment 2 weeks. The wound is located on the Right Calcaneus. The wound measures 1.8cm length x 1.3cm width x 0.3cm depth; 1.838cm^2 area and 0.551cm^3 volume. There is Fat Layer (Subcutaneous Tissue) exposed. There is no tunneling or undermining noted. There is a medium amount of serosanguineous drainage noted. The wound margin is well defined and not attached to the wound base. There is large (67-100%) red granulation within the wound bed. There is a small (1-33%) amount of necrotic tissue within the wound bed including Adherent Slough. General Notes: Slight maceration Assessment Active Problems ICD-10 Type 2 diabetes mellitus with foot ulcer Peripheral vascular disease,  unspecified Other acute osteomyelitis, right ankle and foot Type 2 diabetes mellitus with diabetic neuropathy, unspecified Patient's wound appears healthier today with more vibrant granulation tissue. I recommended continuing Medihoney. Continue surgical shoe for offloading. We will order Prevalon boots to help at night offload the wound bed. He has follow-up with vein and vascular on 9/8. He completes antibiotics for his osteomyelitis of the right calcaneus on 9/16. Think he would benefit from a skin substitute after this. We will run organogenesis products. Plan Follow-up Appointments: Return Appointment in 2 weeks. - 09/23/21 @ 12:30pm with Dr. Heber [place] and Leveda Anna, RN (Room 7) Anesthetic: (In clinic) Topical Lidocaine 5%  applied to wound bed (In clinic) Topical Lidocaine 4% applied to wound bed Cellular or Tissue Based Products: Other Cellular or Tissue Based Products Orders/Instructions: - Will run IVR for Organogenesis products. Bathing/ Shower/ Hygiene: May shower and wash wound with soap and water. Edema Control - Lymphedema / SCD / Other: Avoid standing for long periods of time. Moisturize legs daily. Off-Loading: Open toe surgical shoe to: - Wear socks with your shoes Other: - -Keep pressure off area as much as is possible -Will order Prevalon Boot Additional Orders / Instructions: Follow Nutritious Diet WOUND #1: - Calcaneus Wound Laterality: Right Cleanser: Soap and Water 1 x Per Day/30 Days Discharge Instructions: May shower and wash wound with dial antibacterial soap and water prior to dressing change. Peri-Wound Care: Skin Prep (Generic) 1 x Per Day/30 Days Discharge Instructions: Use skin prep as directed Prim Dressing: MediHoney Gel, tube 1.5 (oz) 1 x Per Day/30 Days ary Discharge Instructions: Apply to wound bed as instructed Secondary Dressing: ALLEVYN Gentle Border, 3x3 (in/in) (Dispense As Written) 1 x Per Day/30 Days Discharge Instructions: Apply over primary  dressing as directed. Add-Ons: Cotton Tip Swabs (Generic) 1 x Per Day/30 Days 1. Medihoney 2. Continue antibiotics per ID 3. Run IVR for organogenesis 4. Surgical shoe 5. Prevalon boot 6. Follow-up in 2 weeks Electronic Signature(s) Signed: 09/09/2021 4:32:10 PM By: Kalman Shan DO Entered By: Kalman Shan on 09/09/2021 13:17:59 -------------------------------------------------------------------------------- HxROS Details Patient Name: Date of Service: [name] MES L. 09/09/2021 12:30 PM Medical Record Number: [id number] Patient Account Number: [id number] Date of Birth/Sex: Treating RN: December 16, 1935 (86 y.o. [name] Primary Care Provider: Dorthy Cooler, [place] Other Clinician: Referring Provider: Treating Provider/Extender: Alanda Slim, [place] Weeks in Treatment: 2 Information Obtained From Patient Chart Eyes Medical History: Positive for: Cataracts - Surgery Cardiovascular Medical History: Positive for: Angina - AFib Past Medical History Notes: Hx CVA Endocrine Medical History: Positive for: Type II Diabetes Treated with: Oral agents, Diet Blood sugar tested every day: No Musculoskeletal Medical History: Positive for: Gout; Osteoarthritis Negative for: Rheumatoid Arthritis; Osteomyelitis Neurologic Medical History: Positive for: Neuropathy HBO Extended History Items Eyes: Cataracts Immunizations Pneumococcal Vaccine: Received Pneumococcal Vaccination: No Implantable Devices None Family and Social History Cancer: No; Diabetes: Yes - Child; Heart Disease: No; Hereditary Spherocytosis: No; Hypertension: No; Kidney Disease: No; Lung Disease: No; Seizures: No; Stroke: Yes - Mother; Thyroid Problems: No; Tuberculosis: No; Former smoker; Marital Status - Divorced; Alcohol Use: Moderate; Drug Use: Prior History; Caffeine Use: Daily; Financial Concerns: No; Food, Clothing or Shelter Needs: No; Support System Lacking: No; Transportation Concerns:  No Electronic Signature(s) Signed: 09/09/2021 4:32:10 PM By: Kalman Shan DO Signed: 09/09/2021 4:38:31 PM By: Lorrin Jackson Entered By: Kalman Shan on 09/09/2021 13:15:43 -------------------------------------------------------------------------------- SuperBill Details Patient Name: Date of Service: [name] MES L. 09/09/2021 Medical Record Number: [id number] Patient Account Number: [id number] Date of Birth/Sex: Treating RN: 1935/11/05 (86 y.o. [name] Primary Care Provider: Dorthy Cooler, [place] Other Clinician: Referring Provider: Treating Provider/Extender: Alanda Slim, [place] Weeks in Treatment: 2 Diagnosis Coding ICD-10 Codes Code Description E11.621 Type 2 diabetes mellitus with foot ulcer I73.9 Peripheral vascular disease, unspecified M86.171 Other acute osteomyelitis, right ankle and foot E11.40 Type 2 diabetes mellitus with diabetic neuropathy, unspecified Facility Procedures CPT4 Code: [id number] Description: 99213 - WOUND CARE VISIT-LEV 3 EST PT Modifier: Quantity: 1 Physician Procedures : CPT4 Code Description Modifier [id number] - WC PHYS LEVEL 3 - EST PT ICD-10 Diagnosis Description E11.621 Type 2 diabetes mellitus with foot ulcer I73.9 Peripheral  vascular disease, unspecified M86.171 Other acute osteomyelitis, right ankle and foot  E11.40 Type 2 diabetes mellitus with diabetic neuropathy, unspecified Quantity: 1 Electronic Signature(s) Signed: 09/09/2021 4:32:10 PM By: Kalman Shan DO Entered By: Kalman Shan on 09/09/2021 13:18:11

## 2021-09-11 ENCOUNTER — Encounter: Payer: Medicare Other | Admitting: Podiatry

## 2021-09-11 NOTE — Progress Notes (Signed)
Office Note     CC: Bilateral lower extremity wounds Requesting Provider:  Lujean Amel, MD  HPI: [name] is a 86 y.o. (July 26, 1935) male presenting at the request of Dr. Earleen Newport, [place].M. with known peripheral arterial disease status post right foot ulcer debridement.  [name] presents today accompanied by his daughter.  On exam, he was doing well.  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  Now retired, he lives in [place] with his daughter and her husband.    [name] has had a right foot heel ulcer present for several months.  Healing has been rather stagnant. He denies erythema, induration, drainage, concern for infection.  [name] stated the superficial toe wounds on bilateral lower extremities have also been present for quite some time.  He denies ischemic rest pain and uses a walker to ambulate.  The pt is not on a statin for cholesterol management.  The pt is on a daily aspirin.    The pt is  diabetic.  Tobacco hx:  former  Past Medical History:  Diagnosis Date   Acute encephalopathy 05/09/2014   Alcoholism ([place])    Arthritis    Cerebral infarction due to embolism of left middle cerebral artery ([place])    Diabetes mellitus type 2, controlled ([place]) 05/04/2014   Essential hypertension 05/04/2014   Gout 05/04/2014   Hypertension    New onset atrial fibrillation ([place]) 05/04/2014   Psoas abscess ([place]) 05/04/2014   Psoas abscess, right ([place])    SDH (subdural hematoma) ([place]) 05/12/2014   Sepsis ([place]) 05/04/2014   Septic arthritis of hip ([place]) 05/18/2014   Stenosis of right carotid artery    40-59 percent    Streptococcal bacteremia 05/09/2014    Past Surgical History:  Procedure Laterality Date   BONE BIOPSY Right 08/13/2021   Procedure: BONE BIOPSY;  Surgeon: Trula Slade, DPM;  Location: [facility];  Service: Podiatry;  Laterality: Right;   HIP ARTHROPLASTY Right    JOINT REPLACEMENT     right hip, bilateral knees   left  femur surgery Left    with rod   right cataract extraction     TONSILLECTOMY     TOTAL SHOULDER ARTHROPLASTY Bilateral    WOUND DEBRIDEMENT Right 08/13/2021   Procedure: DEBRIDEMENT WOUND;  Surgeon: Trula Slade, DPM;  Location: [facility];  Service: Podiatry;  Laterality: Right;    Social History   Socioeconomic History   Marital status: Divorced    Spouse name: Not on file   Number of children: Not on file   Years of education: Not on file   Highest education level: Not on file  Occupational History   Not on file  Tobacco Use   Smoking status: Former    Packs/day: 25.00    Types: Cigarettes   Smokeless tobacco: Never  Vaping Use   Vaping Use: Never used  Substance and Sexual Activity   Alcohol use: Yes    Comment: 4 beers daily   Drug use: Not Currently   Sexual activity: Never  Other Topics Concern   Not on file  Social History Narrative   Not on file   Social Determinants of Health   Financial Resource Strain: Not on file  Food Insecurity: Not on file  Transportation Needs: Not on file  Physical Activity: Not on file  Stress: Not on file  Social Connections: Not on file  Intimate Partner Violence: Not on file    Family History  Problem  Relation Age of Onset   Diabetes Mellitus II Daughter     Current Outpatient Medications  Medication Sig Dispense Refill   acidophilus (RISAQUAD) CAPS capsule Take 1 capsule by mouth daily.     ALPHA LIPOIC ACID PO Take 1 capsule by mouth 2 (two) times daily.     Amino Acids-Protein Hydrolys (FEEDING SUPPLEMENT, PRO-STAT SUGAR FREE 64,) LIQD Place 30 mLs into feeding tube 2 (two) times daily. 900 mL 0   amoxicillin (AMOXIL) 500 MG capsule TAKE 1 CAPSULE BY MOUTH TWICE A DAY 60 capsule 2   aspirin EC 81 MG tablet Take 81 mg by mouth every other day. Swallow whole.     atenolol (TENORMIN) 50 MG tablet Take 50 mg by mouth daily.     cefadroxil (DURICEF) 500 MG capsule Take 1 capsule (500 mg total) by mouth 2 (two) times  daily. 60 capsule 1   Cholecalciferol 25 MCG (1000 UT) capsule Take 1,000 Units by mouth daily.     collagenase (SANTYL) ointment Apply topically daily. 15 g 0   CRESTOR 10 MG tablet Take 10 mg by mouth daily.  0   doxycycline (VIBRA-TABS) 100 MG tablet Take 1 tablet (100 mg total) by mouth 2 (two) times daily. 60 tablet 1   feeding supplement, ENSURE ENLIVE, (ENSURE ENLIVE) LIQD Take 237 mLs by mouth 2 (two) times daily between meals. 237 mL 12   fluticasone (FLONASE) 50 MCG/ACT nasal spray Place 2 sprays into both nostrils daily.     folic acid (FOLVITE) 1 MG tablet Take 1 tablet (1 mg total) by mouth daily.     furosemide (LASIX) 20 MG tablet Take 20 mg by mouth daily.     furosemide (LASIX) 40 MG tablet Take 1 tablet (40 mg total) by mouth daily. 30 tablet 1   hydrALAZINE (APRESOLINE) 25 MG tablet Take 1 tablet (25 mg total) by mouth every 8 (eight) hours. (Patient taking differently: Take 25 mg by mouth in the morning and at bedtime.)     irbesartan (AVAPRO) 75 MG tablet Take 75 mg by mouth daily.     LYRICA 75 MG capsule Take 75-150 mg by mouth See admin instructions. Take 75 mg by mouth in the morning and 150 mg at night     meloxicam (MOBIC) 7.5 MG tablet Take 7.5 mg by mouth daily.     metFORMIN (GLUCOPHAGE) 1000 MG tablet Take 1,000 mg by mouth in the morning and at bedtime.     Multiple Vitamin (MULTIVITAMIN WITH MINERALS) TABS tablet Take 1 tablet by mouth daily.     polyethylene glycol (MIRALAX / GLYCOLAX) packet Take 17 g by mouth daily. 14 each 0   polyvinyl alcohol (LIQUIFILM TEARS) 1.4 % ophthalmic solution Place 1 drop into both eyes as needed for dry eyes.     potassium chloride SA (K-DUR,KLOR-CON) 20 MEQ tablet Take 1 tablet (20 mEq total) by mouth daily. 30 tablet 1   protein supplement shake (PREMIER PROTEIN) LIQD Take 11 oz by mouth daily.     saccharomyces boulardii (FLORASTOR) 250 MG capsule Take 1 capsule (250 mg total) by mouth 2 (two) times daily.     thiamine 100 MG  tablet Take 1 tablet (100 mg total) by mouth daily.     traMADol (ULTRAM) 50 MG tablet Take 2 tablets (100 mg total) by mouth every 6 (six) hours as needed. Every 4 hours (Patient taking differently: Take 100 mg by mouth 3 (three) times daily as needed for moderate pain.) 30 tablet  0   ULORIC 80 MG TABS Take 80 mg by mouth daily.  3   No current facility-administered medications for this visit.    Allergies  Allergen Reactions   Allopurinol Other (See Comments)    Makes head 'feel funny'   Atorvastatin Other (See Comments)   Motrin [Ibuprofen] Other (See Comments)    Blisters in groin area     REVIEW OF SYSTEMS:   '[X]'$  denotes positive finding, '[ ]'$  denotes negative finding Cardiac  Comments:  Chest pain or chest pressure:    Shortness of breath upon exertion:    Short of breath when lying flat:    Irregular heart rhythm:        Vascular    Pain in calf, thigh, or hip brought on by ambulation:    Pain in feet at night that wakes you up from your sleep:     Blood clot in your veins:    Leg swelling:         Pulmonary    Oxygen at home:    Productive cough:     Wheezing:         Neurologic    Sudden weakness in arms or legs:     Sudden numbness in arms or legs:     Sudden onset of difficulty speaking or slurred speech:    Temporary loss of vision in one eye:     Problems with dizziness:         Gastrointestinal    Blood in stool:     Vomited blood:         Genitourinary    Burning when urinating:     Blood in urine:        Psychiatric    Major depression:         Hematologic    Bleeding problems:    Problems with blood clotting too easily:        Skin    Rashes or ulcers:        Constitutional    Fever or chills:      PHYSICAL EXAMINATION:  There were no vitals filed for this visit.  General:  WDWN in NAD; vital signs documented above Gait: Not observed HENT: WNL, normocephalic Pulmonary: normal non-labored breathing , without wheezing Cardiac:  irregular HR Abdomen: soft, NT, no masses Skin: without rashes Vascular Exam/Pulses:  Right Left  Radial 2+ (normal) 2+ (normal)  Ulnar            DP absent absent  PT absent absent   Extremities: with ischemic changes, without Gangrene , without cellulitis; with open wounds;  Musculoskeletal: no muscle wasting or atrophy  Neurologic: A&O X 3;  No focal weakness or paresthesias are detected Psychiatric:  The pt has Normal affect.   Non-Invasive Vascular Imaging:   ABI Findings:  +---------+------------------+-----+----------+--------+  Right    Rt Pressure (mmHg)IndexWaveform  Comment   +---------+------------------+-----+----------+--------+  Brachial 143                                        +---------+------------------+-----+----------+--------+  PTA      92                0.58 monophasic          +---------+------------------+-----+----------+--------+  DP       83  0.53 monophasic          +---------+------------------+-----+----------+--------+  Great Toe                       Abnormal            +---------+------------------+-----+----------+--------+   +---------+------------------+-----+----------+-------+  Left     Lt Pressure (mmHg)IndexWaveform  Comment  +---------+------------------+-----+----------+-------+  Brachial 158                                       +---------+------------------+-----+----------+-------+  PTA      83                0.53 monophasic         +---------+------------------+-----+----------+-------+  DP       78                0.49 monophasic         +---------+------------------+-----+----------+-------+  Great Toe65                0.41 Abnormal           +---------+------------------+-----+----------+-------+     ASSESSMENT/PLAN: HERNANDO REALI is a 86 y.o. male presenting with bilateral lower extremity critical limb ischemia with tissue loss.  ABI was  reviewed demonstrating severe peripheral arterial disease bilaterally.  Waveforms are concerning for multilevel occlusive disease. Right toe pressure severely depressed. His wounds will likely not heal with his current perfusion.  I reviewed a prior CT scan from 2016 demonstrating severe inflow disease.  [name] would benefit from repeat CT angiogram with runoff in an effort to define progression.  Imaging will be integral in defining treatment options.  I plan to see Caillou back in the next week or so to discuss his CT scan    [name], MD [facility] [phone]

## 2021-09-12 ENCOUNTER — Encounter: Payer: Self-pay | Admitting: Vascular Surgery

## 2021-09-12 ENCOUNTER — Other Ambulatory Visit: Payer: Self-pay

## 2021-09-12 ENCOUNTER — Ambulatory Visit: Payer: Medicare Other | Admitting: Vascular Surgery

## 2021-09-12 VITALS — BP 125/71 | HR 89 | Temp 97.7°F | Resp 20 | Ht 69.0 in | Wt 178.0 lb

## 2021-09-12 DIAGNOSIS — I70223 Atherosclerosis of native arteries of extremities with rest pain, bilateral legs: Secondary | ICD-10-CM

## 2021-09-15 ENCOUNTER — Ambulatory Visit (HOSPITAL_BASED_OUTPATIENT_CLINIC_OR_DEPARTMENT_OTHER)
Admission: RE | Admit: 2021-09-15 | Discharge: 2021-09-15 | Disposition: A | Discharge status: Home / Self Care | Payer: Medicare Other | Source: Ambulatory Visit | Attending: Vascular Surgery | Admitting: Vascular Surgery

## 2021-09-15 DIAGNOSIS — K5730 Diverticulosis of large intestine without perforation or abscess without bleeding: Secondary | ICD-10-CM | POA: Diagnosis not present

## 2021-09-15 DIAGNOSIS — I70223 Atherosclerosis of native arteries of extremities with rest pain, bilateral legs: Secondary | ICD-10-CM | POA: Diagnosis not present

## 2021-09-15 DIAGNOSIS — I700 Atherosclerosis of aorta: Secondary | ICD-10-CM | POA: Diagnosis not present

## 2021-09-15 MED ORDER — IOHEXOL 350 MG/ML SOLN
100.0000 mL | Freq: Once | INTRAVENOUS | Status: AC | PRN
  Administered 2021-09-15: 100 mL via INTRAVENOUS

## 2021-09-17 ENCOUNTER — Ambulatory Visit (HOSPITAL_COMMUNITY): Admission: RE | Admit: 2021-09-17 | Payer: Medicare Other | Source: Home / Self Care | Admitting: Vascular Surgery

## 2021-09-17 ENCOUNTER — Encounter (HOSPITAL_COMMUNITY): Admission: RE | Payer: Self-pay | Source: Home / Self Care

## 2021-09-17 SURGERY — ABDOMINAL AORTOGRAM W/LOWER EXTREMITY
Anesthesia: LOCAL

## 2021-09-18 NOTE — Progress Notes (Unsigned)
Office Note    HPI: [name] is a 86 y.o. (07/01/1935) male presenting in follow-up with known peripheral arterial disease and right lower extremity wounds.  At his last visit, he had nonpalpable pulses in the groin necessitating CT angiogram for central imaging.  He presents today with his daughter to discuss these findings.    On exam today, [name] was doing well.  He denied any recent changes in wound healing.  This is being followed by Dr. Ardelle Anton at [facility].  [name] was in a wheelchair today, but stated that was only because the chairs hurt his bottom.  He continues to ambulate independently.  The pt is not on a statin for cholesterol management.  The pt is on a daily aspirin.    The pt is  diabetic.  Tobacco hx:  former  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/04/2014   Psoas abscess, right ([facility])    SDH (subdural hematoma) ([facility]) 05/12/2014   Sepsis ([facility]) 05/04/2014   Septic arthritis of hip ([facility]) 05/18/2014   Stenosis of right carotid artery    40-59 percent    Streptococcal bacteremia 05/09/2014    Past Surgical History:  Procedure Laterality Date   BONE BIOPSY Right 08/13/2021   Procedure: BONE BIOPSY;  Surgeon: Vivi Barrack, DPM;  Location: [facility];  Service: Podiatry;  Laterality: Right;   HIP ARTHROPLASTY Right    JOINT REPLACEMENT     right hip, bilateral knees   left femur surgery Left    with rod   right cataract extraction     TONSILLECTOMY     TOTAL SHOULDER ARTHROPLASTY Bilateral    WOUND DEBRIDEMENT Right 08/13/2021   Procedure: DEBRIDEMENT WOUND;  Surgeon: Vivi Barrack, DPM;  Location: [facility];  Service:  Podiatry;  Laterality: Right;    Social History   Socioeconomic History   Marital status: Divorced    Spouse name: Not on file   Number of children: Not on file   Years of education: Not on file   Highest education level: Not on file  Occupational History   Not on file  Tobacco Use   Smoking status: Former    Packs/day: 25.00    Types: Cigarettes   Smokeless tobacco: Never  Vaping Use   Vaping Use: Never used  Substance and Sexual Activity   Alcohol use: Yes    Comment: 4 beers daily   Drug use: Not Currently   Sexual activity: Never  Other Topics Concern   Not on file  Social History Narrative   Not on file   Social Determinants of Health   Financial Resource Strain: Not on file  Food Insecurity: Not on file  Transportation Needs: Not on file  Physical Activity: Not on file  Stress: Not on file  Social Connections: Not on file  Intimate Partner Violence: Not on file    Family History  Problem Relation Age of Onset   Diabetes Mellitus II Daughter     Current Outpatient Medications  Medication Sig Dispense Refill   acidophilus (RISAQUAD) CAPS capsule Take 1 capsule by mouth daily.  ALPHA LIPOIC ACID PO Take 1 capsule by mouth 2 (two) times daily.     Amino Acids-Protein Hydrolys (FEEDING SUPPLEMENT, PRO-STAT SUGAR FREE 64,) LIQD Place 30 mLs into feeding tube 2 (two) times daily. 900 mL 0   amoxicillin (AMOXIL) 500 MG capsule TAKE 1 CAPSULE BY MOUTH TWICE A DAY 60 capsule 2   aspirin EC 81 MG tablet Take 81 mg by mouth every other day. Swallow whole.     atenolol (TENORMIN) 50 MG tablet Take 50 mg by mouth daily.     cefadroxil (DURICEF) 500 MG capsule Take 1 capsule (500 mg total) by mouth 2 (two) times daily. 60 capsule 1   Cholecalciferol 25 MCG (1000 UT) capsule Take 1,000 Units by mouth daily.     collagenase (SANTYL) ointment Apply topically daily. 15 g 0   CRESTOR 10 MG tablet Take 10 mg by mouth daily.  0   doxycycline (VIBRA-TABS) 100 MG tablet  Take 1 tablet (100 mg total) by mouth 2 (two) times daily. 60 tablet 1   feeding supplement, ENSURE ENLIVE, (ENSURE ENLIVE) LIQD Take 237 mLs by mouth 2 (two) times daily between meals. 237 mL 12   fluticasone (FLONASE) 50 MCG/ACT nasal spray Place 2 sprays into both nostrils daily.     folic acid (FOLVITE) 1 MG tablet Take 1 tablet (1 mg total) by mouth daily.     furosemide (LASIX) 20 MG tablet Take 20 mg by mouth daily.     furosemide (LASIX) 40 MG tablet Take 1 tablet (40 mg total) by mouth daily. 30 tablet 1   hydrALAZINE (APRESOLINE) 25 MG tablet Take 1 tablet (25 mg total) by mouth every 8 (eight) hours. (Patient taking differently: Take 25 mg by mouth in the morning and at bedtime.)     irbesartan (AVAPRO) 75 MG tablet Take 75 mg by mouth daily.     LYRICA 75 MG capsule Take 75-150 mg by mouth See admin instructions. Take 75 mg by mouth in the morning and 150 mg at night     meloxicam (MOBIC) 7.5 MG tablet Take 7.5 mg by mouth daily.     metFORMIN (GLUCOPHAGE) 1000 MG tablet Take 1,000 mg by mouth in the morning and at bedtime.     Multiple Vitamin (MULTIVITAMIN WITH MINERALS) TABS tablet Take 1 tablet by mouth daily.     polyethylene glycol (MIRALAX / GLYCOLAX) packet Take 17 g by mouth daily. 14 each 0   polyvinyl alcohol (LIQUIFILM TEARS) 1.4 % ophthalmic solution Place 1 drop into both eyes as needed for dry eyes.     potassium chloride SA (K-DUR,KLOR-CON) 20 MEQ tablet Take 1 tablet (20 mEq total) by mouth daily. 30 tablet 1   protein supplement shake (PREMIER PROTEIN) LIQD Take 11 oz by mouth daily.     saccharomyces boulardii (FLORASTOR) 250 MG capsule Take 1 capsule (250 mg total) by mouth 2 (two) times daily.     thiamine 100 MG tablet Take 1 tablet (100 mg total) by mouth daily.     traMADol (ULTRAM) 50 MG tablet Take 2 tablets (100 mg total) by mouth every 6 (six) hours as needed. Every 4 hours (Patient taking differently: Take 100 mg by mouth 3 (three) times daily as needed for  moderate pain.) 30 tablet 0   ULORIC 80 MG TABS Take 80 mg by mouth daily.  3   No current facility-administered medications for this visit.    Allergies  Allergen Reactions   Allopurinol Other (See Comments)  Makes head 'feel funny'   Atorvastatin Other (See Comments)   Motrin [Ibuprofen] Other (See Comments)    Blisters in groin area     REVIEW OF SYSTEMS:   [X]  denotes positive finding, [ ]  denotes negative finding Cardiac  Comments:  Chest pain or chest pressure:    Shortness of breath upon exertion:    Short of breath when lying flat:    Irregular heart rhythm:        Vascular    Pain in calf, thigh, or hip brought on by ambulation:    Pain in feet at night that wakes you up from your sleep:     Blood clot in your veins:    Leg swelling:         Pulmonary    Oxygen at home:    Productive cough:     Wheezing:         Neurologic    Sudden weakness in arms or legs:     Sudden numbness in arms or legs:     Sudden onset of difficulty speaking or slurred speech:    Temporary loss of vision in one eye:     Problems with dizziness:         Gastrointestinal    Blood in stool:     Vomited blood:         Genitourinary    Burning when urinating:     Blood in urine:        Psychiatric    Major depression:         Hematologic    Bleeding problems:    Problems with blood clotting too easily:        Skin    Rashes or ulcers:        Constitutional    Fever or chills:      PHYSICAL EXAMINATION:  There were no vitals filed for this visit.  General:  WDWN in NAD; vital signs documented above Gait: Not observed HENT: WNL, normocephalic Pulmonary: normal non-labored breathing , without wheezing Cardiac: irregular HR Abdomen: soft, NT, no masses Skin: without rashes Vascular Exam/Pulses:  Right Left  Radial 2+ (normal) 2+ (normal)  Ulnar            DP absent absent  PT absent absent   Extremities: with ischemic changes, without Gangrene , without  cellulitis; with open wounds;  Wounds on the right foot, no wounds on the left foot. Musculoskeletal: no muscle wasting or atrophy  Neurologic: A&O X 3;  No focal weakness or paresthesias are detected Psychiatric:  The pt has Normal affect.   Non-Invasive Vascular Imaging:   ABI Findings:  +---------+------------------+-----+----------+--------+  Right    Rt Pressure (mmHg)IndexWaveform  Comment   +---------+------------------+-----+----------+--------+  Brachial 143                                        +---------+------------------+-----+----------+--------+  PTA      92                0.58 monophasic          +---------+------------------+-----+----------+--------+  DP       83                0.53 monophasic          +---------+------------------+-----+----------+--------+  Great Toe  Abnormal            +---------+------------------+-----+----------+--------+   +---------+------------------+-----+----------+-------+  Left     Lt Pressure (mmHg)IndexWaveform  Comment  +---------+------------------+-----+----------+-------+  Brachial 158                                       +---------+------------------+-----+----------+-------+  PTA      83                0.53 monophasic         +---------+------------------+-----+----------+-------+  DP       78                0.49 monophasic         +---------+------------------+-----+----------+-------+  Great Toe65                0.41 Abnormal           +---------+------------------+-----+----------+-------+     ASSESSMENT/PLAN: SOHUM KLINGERMAN is a 86 y.o. male presenting with bilateral lower extremity critical limb ischemia with right-sided tissue loss.  Recent CT angiogram demonstrated inflow disease in bilateral common iliac arteries, as well as nearly-occlusive lesions in bilateral common femoral arteries.  Runoff illustrated multilevel occlusive disease  with significant atherosclerosis at the popliteal artery bilaterally.  Runoff was difficult to assess due to the amount of calcium but appeared to be posterior tibial bilaterally.  Had a long conversation with [name] and his daughter regarding the above.  At this point, I think he would be best served with bilateral lower extremity angiogram, with emphasis on the right from a left radial approach.  After discussing the risk and benefits, [name] elected to proceed.  This will be scheduled for Wednesday. The data from this angiogram provide insight into the repair needed to establish inline flow to the foot.  Kenneth Sparrow, MD [facility] [phone]

## 2021-09-19 ENCOUNTER — Other Ambulatory Visit: Payer: Self-pay

## 2021-09-19 ENCOUNTER — Ambulatory Visit: Payer: Medicare Other | Admitting: Vascular Surgery

## 2021-09-19 ENCOUNTER — Encounter: Payer: Self-pay | Admitting: Vascular Surgery

## 2021-09-19 VITALS — BP 138/85 | HR 78 | Temp 97.9°F | Resp 20 | Ht 69.0 in | Wt 178.0 lb

## 2021-09-19 DIAGNOSIS — I70223 Atherosclerosis of native arteries of extremities with rest pain, bilateral legs: Secondary | ICD-10-CM

## 2021-09-19 MED ORDER — SODIUM CHLORIDE 0.9% FLUSH: 3.0000 mL | INTRAVENOUS | Status: DC | PRN

## 2021-09-19 MED ORDER — SODIUM CHLORIDE 0.9 % IV SOLN: 250.0000 mL | INTRAVENOUS | Status: DC | PRN

## 2021-09-19 MED ORDER — SODIUM CHLORIDE 0.9% FLUSH: 3.0000 mL | Freq: Two times a day (BID) | INTRAVENOUS | Status: DC

## 2021-09-23 ENCOUNTER — Encounter (HOSPITAL_BASED_OUTPATIENT_CLINIC_OR_DEPARTMENT_OTHER): Payer: Medicare Other | Admitting: Internal Medicine

## 2021-09-23 DIAGNOSIS — Z87891 Personal history of nicotine dependence: Secondary | ICD-10-CM | POA: Diagnosis not present

## 2021-09-23 DIAGNOSIS — I4891 Unspecified atrial fibrillation: Secondary | ICD-10-CM | POA: Diagnosis not present

## 2021-09-23 DIAGNOSIS — E11621 Type 2 diabetes mellitus with foot ulcer: Secondary | ICD-10-CM

## 2021-09-23 DIAGNOSIS — E1142 Type 2 diabetes mellitus with diabetic polyneuropathy: Secondary | ICD-10-CM | POA: Diagnosis not present

## 2021-09-23 DIAGNOSIS — M86171 Other acute osteomyelitis, right ankle and foot: Secondary | ICD-10-CM

## 2021-09-23 DIAGNOSIS — I739 Peripheral vascular disease, unspecified: Secondary | ICD-10-CM | POA: Diagnosis not present

## 2021-09-23 DIAGNOSIS — E1151 Type 2 diabetes mellitus with diabetic peripheral angiopathy without gangrene: Secondary | ICD-10-CM | POA: Diagnosis not present

## 2021-09-23 DIAGNOSIS — B965 Pseudomonas (aeruginosa) (mallei) (pseudomallei) as the cause of diseases classified elsewhere: Secondary | ICD-10-CM | POA: Diagnosis not present

## 2021-09-23 DIAGNOSIS — E1169 Type 2 diabetes mellitus with other specified complication: Secondary | ICD-10-CM | POA: Diagnosis not present

## 2021-09-23 DIAGNOSIS — Z8614 Personal history of Methicillin resistant Staphylococcus aureus infection: Secondary | ICD-10-CM | POA: Diagnosis not present

## 2021-09-23 DIAGNOSIS — L97512 Non-pressure chronic ulcer of other part of right foot with fat layer exposed: Secondary | ICD-10-CM

## 2021-09-23 DIAGNOSIS — L080 Pyoderma: Secondary | ICD-10-CM | POA: Diagnosis not present

## 2021-09-23 NOTE — Progress Notes (Signed)
Roper, Harm L. ([id number]) Visit Report for 09/23/2021 Arrival Information Details Patient Name: Date of Service: [name], Kenneth MES L. 09/23/2021 12:30 PM Medical Record Number: [id number] Patient Account Number: [id number] Date of Birth/Sex: Treating RN: 02/05/1935 (86 y.o. Mare Ferrari Primary Care Majid Mccravy: Dorthy Cooler, Dibas Other Clinician: Referring Herschell Virani: Treating Jannae Fagerstrom/Extender: Alanda Slim, Dibas Weeks in Treatment: 4 Visit Information History Since Last Visit Added or deleted any medications: No Patient Arrived: Gilford Rile Any new allergies or adverse reactions: No Arrival Time: 12:42 Had a fall or experienced change in No Accompanied By: daughter activities of daily living that may affect Transfer Assistance: None risk of falls: Patient Identification Verified: Yes Signs or symptoms of abuse/neglect since last visito No Secondary Verification Process Completed: Yes Hospitalized since last visit: No Patient Requires Transmission-Based Precautions: No Implantable device outside of the clinic excluding No Patient Has Alerts: Yes cellular tissue based products placed in the center Patient Alerts: Patient on Blood Thinner since last visit: ABI R=.58 TBI R= Has Dressing in Place as Prescribed: Yes Pain Present Now: No Electronic Signature(s) Signed: 09/23/2021 5:02:41 PM By: Sharyn Creamer RN, BSN Entered By: Sharyn Creamer on 09/23/2021 12:48:49 -------------------------------------------------------------------------------- Clinic Level of Care Assessment Details Patient Name: Date of Service: [name], [name] MES L. 09/23/2021 12:30 PM Medical Record Number: [id number] Patient Account Number: [id number] Date of Birth/Sex: Treating RN: 1935-07-27 (86 y.o. Mare Ferrari Primary Care Payslie Mccaig: Dorthy Cooler, Dibas Other Clinician: Referring Nataniel Gasper: Treating Roxy Filler/Extender: Alanda Slim, Dibas Weeks in Treatment: 4 Clinic Level of Care Assessment  Items TOOL 4 Quantity Score X- 1 0 Use when only an EandM is performed on FOLLOW-UP visit ASSESSMENTS - Nursing Assessment / Reassessment X- 1 10 Reassessment of Co-morbidities (includes updates in patient status) X- 1 5 Reassessment of Adherence to Treatment Plan ASSESSMENTS - Wound and Skin A ssessment / Reassessment X - Simple Wound Assessment / Reassessment - one wound 1 5 '[]'$  - 0 Complex Wound Assessment / Reassessment - multiple wounds '[]'$  - 0 Dermatologic / Skin Assessment (not related to wound area) ASSESSMENTS - Focused Assessment X- 1 5 Circumferential Edema Measurements - multi extremities '[]'$  - 0 Nutritional Assessment / Counseling / Intervention '[]'$  - 0 Lower Extremity Assessment (monofilament, tuning fork, pulses) '[]'$  - 0 Peripheral Arterial Disease Assessment (using hand held doppler) ASSESSMENTS - Ostomy and/or Continence Assessment and Care '[]'$  - 0 Incontinence Assessment and Management '[]'$  - 0 Ostomy Care Assessment and Management (repouching, etc.) PROCESS - Coordination of Care X - Simple Patient / Family Education for ongoing care 1 15 '[]'$  - 0 Complex (extensive) Patient / Family Education for ongoing care X- 1 10 Staff obtains Programmer, systems, Records, T Results / Process Orders est '[]'$  - 0 Staff telephones HHA, Nursing Homes / Clarify orders / etc '[]'$  - 0 Routine Transfer to another Facility (non-emergent condition) '[]'$  - 0 Routine Hospital Admission (non-emergent condition) '[]'$  - 0 New Admissions / Biomedical engineer / Ordering NPWT Apligraf, etc. , '[]'$  - 0 Emergency Hospital Admission (emergent condition) '[]'$  - 0 Simple Discharge Coordination '[]'$  - 0 Complex (extensive) Discharge Coordination PROCESS - Special Needs '[]'$  - 0 Pediatric / Minor Patient Management '[]'$  - 0 Isolation Patient Management '[]'$  - 0 Hearing / Language / Visual special needs '[]'$  - 0 Assessment of Community assistance (transportation, D/C planning, etc.) '[]'$  - 0 Additional  assistance / Altered mentation '[]'$  - 0 Support Surface(s) Assessment (bed, cushion, seat, etc.) INTERVENTIONS - Wound Cleansing / Measurement X - Simple Wound Cleansing - one wound 1  5 '[]'$  - 0 Complex Wound Cleansing - multiple wounds X- 1 5 Wound Imaging (photographs - any number of wounds) '[]'$  - 0 Wound Tracing (instead of photographs) X- 1 5 Simple Wound Measurement - one wound '[]'$  - 0 Complex Wound Measurement - multiple wounds INTERVENTIONS - Wound Dressings X - Small Wound Dressing one or multiple wounds 1 10 '[]'$  - 0 Medium Wound Dressing one or multiple wounds '[]'$  - 0 Large Wound Dressing one or multiple wounds X- 1 5 Application of Medications - topical '[]'$  - 0 Application of Medications - injection INTERVENTIONS - Miscellaneous '[]'$  - 0 External ear exam '[]'$  - 0 Specimen Collection (cultures, biopsies, blood, body fluids, etc.) X- 1 5 Specimen(s) / Culture(s) sent or taken to Lab for analysis '[]'$  - 0 Patient Transfer (multiple staff / Civil Service fast streamer / Similar devices) '[]'$  - 0 Simple Staple / Suture removal (25 or less) '[]'$  - 0 Complex Staple / Suture removal (26 or more) '[]'$  - 0 Hypo / Hyperglycemic Management (close monitor of Blood Glucose) '[]'$  - 0 Ankle / Brachial Index (ABI) - do not check if billed separately X- 1 5 Vital Signs Has the patient been seen at the hospital within the last three years: Yes Total Score: 90 Level Of Care: New/Established - Level 3 Electronic Signature(s) Signed: 09/23/2021 5:02:41 PM By: Sharyn Creamer RN, BSN Entered By: Sharyn Creamer on 09/23/2021 13:23:06 -------------------------------------------------------------------------------- Encounter Discharge Information Details Patient Name: Date of Service: [name] MES L. 09/23/2021 12:30 PM Medical Record Number: [id number] Patient Account Number: [id number] Date of Birth/Sex: Treating RN: 07-Jun-1935 (86 y.o. Mare Ferrari Primary Care Kalle Bernath: Dorthy Cooler, Dibas Other  Clinician: Referring Jamale Spangler: Treating Elier Zellars/Extender: Alanda Slim, Dibas Weeks in Treatment: 4 Encounter Discharge Information Items Discharge Condition: Stable Ambulatory Status: Walker Discharge Destination: Home Transportation: Private Auto Accompanied By: daughter Schedule Follow-up Appointment: Yes Clinical Summary of Care: Patient Declined Electronic Signature(s) Signed: 09/23/2021 5:02:41 PM By: Sharyn Creamer RN, BSN Entered By: Sharyn Creamer on 09/23/2021 16:23:43 -------------------------------------------------------------------------------- Lower Extremity Assessment Details Patient Name: Date of Service: [name] MES L. 09/23/2021 12:30 PM Medical Record Number: [id number] Patient Account Number: [id number] Date of Birth/Sex: Treating RN: 1935/12/21 (86 y.o. Mare Ferrari Primary Care Udell Blasingame: Dorthy Cooler, Dibas Other Clinician: Referring Jovanne Riggenbach: Treating Tanae Petrosky/Extender: Alanda Slim, Dibas Weeks in Treatment: 4 Edema Assessment Assessed: [Left: No] [Right: No] Edema: [Left: Ye] [Right: s] Calf Left: Right: Point of Measurement: 42 cm From Medial Instep 36.5 cm Ankle Left: Right: Point of Measurement: 8 cm From Medial Instep 22.2 cm Vascular Assessment Pulses: Dorsalis Pedis Palpable: [Right:No] Electronic Signature(s) Signed: 09/23/2021 5:02:41 PM By: Sharyn Creamer RN, BSN Entered By: Sharyn Creamer on 09/23/2021 12:51:11 -------------------------------------------------------------------------------- Multi Wound Chart Details Patient Name: Date of Service: [name] MES L. 09/23/2021 12:30 PM Medical Record Number: [id number] Patient Account Number: [id number] Date of Birth/Sex: Treating RN: Aug 18, 1935 (86 y.o. M) Primary Care Raoul Ciano: Dorthy Cooler, Dibas Other Clinician: Referring Dannon Nguyenthi: Treating Miasha Emmons/Extender: Alanda Slim, Dibas Weeks in Treatment: 4 Vital Signs Height(in): 68 Pulse(bpm):  128 Weight(lbs): 198 Blood Pressure(mmHg): 155/80 Body Mass Index(BMI): 30.1 Temperature(F): 97.6 Respiratory Rate(breaths/min): 20 Photos: [N/A:N/A] Right Calcaneus N/A N/A Wound Location: Gradually Appeared N/A N/A Wounding Event: Diabetic Wound/Ulcer of the Lower N/A N/A Primary Etiology: Extremity Cataracts, Angina, Type II Diabetes, N/A N/A Comorbid History: Gout, Osteoarthritis, Neuropathy 06/05/2021 N/A N/A Date Acquired: 4 N/A N/A Weeks of Treatment: Open N/A N/A Wound Status: No N/A N/A Wound Recurrence: 1.4x1.5x0.2 N/A N/A Measurements L x W x  D (cm) 1.649 N/A N/A A (cm) : rea 0.33 N/A N/A Volume (cm) : -7.60% N/A N/A % Reduction in A rea: 28.10% N/A N/A % Reduction in Volume: Grade 1 N/A N/A Classification: Medium N/A N/A Exudate A mount: Serosanguineous N/A N/A Exudate Type: red, brown N/A N/A Exudate Color: Well defined, not attached N/A N/A Wound Margin: Large (67-100%) N/A N/A Granulation A mount: Red N/A N/A Granulation Quality: Small (1-33%) N/A N/A Necrotic A mount: Fat Layer (Subcutaneous Tissue): Yes N/A N/A Exposed Structures: Fascia: No Tendon: No Muscle: No Joint: No Bone: No None N/A N/A Epithelialization: Treatment Notes Electronic Signature(s) Signed: 09/23/2021 1:17:37 PM By: Kalman Shan DO Entered By: Kalman Shan on 09/23/2021 13:09:59 -------------------------------------------------------------------------------- Multi-Disciplinary Care Plan Details Patient Name: Date of Service: [name] MES L. 09/23/2021 12:30 PM Medical Record Number: [id number] Patient Account Number: [id number] Date of Birth/Sex: Treating RN: [birth year] (86 y.o. Mare Ferrari Primary Care Zariyah Stephens: Dorthy Cooler, Dibas Other Clinician: Referring Vane Yapp: Treating Cordelro Gautreau/Extender: Alanda Slim, Dibas Weeks in Treatment: 4 Active Inactive Pressure Nursing Diagnoses: Knowledge deficit related to management of  pressures ulcers Goals: Patient/caregiver will verbalize understanding of pressure ulcer management Date Initiated: 08/26/2021 Target Resolution Date: 09/23/2021 Goal Status: Active Interventions: Assess: immobility, friction, shearing, incontinence upon admission and as needed Assess offloading mechanisms upon admission and as needed Provide education on pressure ulcers Notes: Wound/Skin Impairment Nursing Diagnoses: Impaired tissue integrity Goals: Patient/caregiver will verbalize understanding of skin care regimen Date Initiated: 08/26/2021 Target Resolution Date: 09/23/2021 Goal Status: Active Ulcer/skin breakdown will have a volume reduction of 30% by week 4 Date Initiated: 08/26/2021 Target Resolution Date: 09/23/2021 Goal Status: Active Interventions: Assess patient/caregiver ability to obtain necessary supplies Assess patient/caregiver ability to perform ulcer/skin care regimen upon admission and as needed Assess ulceration(s) every visit Provide education on ulcer and skin care Treatment Activities: Topical wound management initiated : 08/26/2021 Notes: Electronic Signature(s) Signed: 09/23/2021 5:02:41 PM By: Sharyn Creamer RN, BSN Entered By: Sharyn Creamer on 09/23/2021 12:59:52 -------------------------------------------------------------------------------- Pain Assessment Details Patient Name: Date of Service: [name] MES L. 09/23/2021 12:30 PM Medical Record Number: [id number] Patient Account Number: [id number] Date of Birth/Sex: Treating RN: 10-21-1935 (86 y.o. Mare Ferrari Primary Care Rual Vermeer: Dorthy Cooler, Dibas Other Clinician: Referring Arden Tinoco: Treating Darriel Utter/Extender: Alanda Slim, Dibas Weeks in Treatment: 4 Active Problems Location of Pain Severity and Description of Pain Patient Has Paino No Site Locations Pain Management and Medication Current Pain Management: Electronic Signature(s) Signed: 09/23/2021 5:02:41 PM By: Sharyn Creamer RN, BSN Entered By: Sharyn Creamer on 09/23/2021 12:49:20 -------------------------------------------------------------------------------- Patient/Caregiver Education Details Patient Name: Date of Service: [name] MES L. 9/19/2023andnbsp12:30 PM Medical Record Number: [id number] Patient Account Number: [id number] Date of Birth/Gender: Treating RN: 05-08-1935 (86 y.o. Mare Ferrari Primary Care Physician: Dorthy Cooler, Dibas Other Clinician: Referring Physician: Treating Physician/Extender: Alanda Slim, Dibas Weeks in Treatment: 4 Education Assessment Education Provided To: Patient Education Topics Provided Wound/Skin Impairment: Methods: Explain/Verbal Responses: State content correctly Electronic Signature(s) Signed: 09/23/2021 5:02:41 PM By: Sharyn Creamer RN, BSN Entered By: Sharyn Creamer on 09/23/2021 13:00:10 -------------------------------------------------------------------------------- Wound Assessment Details Patient Name: Date of Service: [name] MES L. 09/23/2021 12:30 PM Medical Record Number: [id number] Patient Account Number: [id number] Date of Birth/Sex: Treating RN: [birth year] (86 y.o. Mare Ferrari Primary Care Samarth Ogle: Dorthy Cooler, Dibas Other Clinician: Referring Miriya Cloer: Treating Yazlynn Birkeland/Extender: Alanda Slim, Dibas Weeks in Treatment: 4 Wound Status Wound Number: 1 Primary Diabetic Wound/Ulcer of the Lower Extremity Etiology: Wound Location: Right Calcaneus Wound Status: Open Wounding  Event: Gradually Appeared Comorbid Cataracts, Angina, Type II Diabetes, Gout, Osteoarthritis, Date Acquired: 06/05/2021 History: Neuropathy Weeks Of Treatment: 4 Clustered Wound: No Photos Wound Measurements Length: (cm) 1.4 Width: (cm) 1.5 Depth: (cm) 0.2 Area: (cm) 1.649 Volume: (cm) 0.33 % Reduction in Area: -7.6% % Reduction in Volume: 28.1% Epithelialization: None Tunneling: No Undermining: No Wound  Description Classification: Grade 1 Wound Margin: Well defined, not attached Exudate Amount: Medium Exudate Type: Serosanguineous Exudate Color: red, brown Foul Odor After Cleansing: No Slough/Fibrino Yes Wound Bed Granulation Amount: Large (67-100%) Exposed Structure Granulation Quality: Red Fascia Exposed: No Necrotic Amount: Small (1-33%) Fat Layer (Subcutaneous Tissue) Exposed: Yes Necrotic Quality: Adherent Slough Tendon Exposed: No Muscle Exposed: No Joint Exposed: No Bone Exposed: No Treatment Notes Wound #1 (Calcaneus) Wound Laterality: Right Cleanser Soap and Water Discharge Instruction: May shower and wash wound with dial antibacterial soap and water prior to dressing change. Peri-Wound Care Skin Prep Discharge Instruction: Use skin prep as directed Topical Primary Dressing Hydrofera Blue Ready Foam, 2.5 x2.5 in Discharge Instruction: Apply to wound bed as instructed MediHoney Gel, tube 1.5 (oz) Discharge Instruction: Apply to wound bed as instructed Secondary Dressing ALLEVYN Gentle Border, 3x3 (in/in) Discharge Instruction: Apply over primary dressing as directed. Secured With Compression Wrap Compression Stockings Add-Ons Cotton Tip Swabs Electronic Signature(s) Signed: 09/23/2021 5:02:41 PM By: Sharyn Creamer RN, BSN Entered By: Sharyn Creamer on 09/23/2021 12:56:43 -------------------------------------------------------------------------------- Vitals Details Patient Name: Date of Service: [name] MES L. 09/23/2021 12:30 PM Medical Record Number: [id number] Patient Account Number: [id number] Date of Birth/Sex: Treating RN: 1935-07-16 (86 y.o. Mare Ferrari Primary Care Jeiden Daughtridge: Dorthy Cooler, Dibas Other Clinician: Referring Nechemia Chiappetta: Treating Glorya Bartley/Extender: Alanda Slim, Dibas Weeks in Treatment: 4 Vital Signs Time Taken: 12:45 Temperature (F): 97.6 Height (in): 68 Pulse (bpm): 128 Weight (lbs): 198 Respiratory Rate  (breaths/min): 20 Body Mass Index (BMI): 30.1 Blood Pressure (mmHg): 155/80 Reference Range: 80 - 120 mg / dl Electronic Signature(s) Signed: 09/23/2021 5:02:41 PM By: Sharyn Creamer RN, BSN Entered By: Sharyn Creamer on 09/23/2021 12:49:12

## 2021-09-24 ENCOUNTER — Ambulatory Visit (HOSPITAL_COMMUNITY)
Admission: RE | Disposition: A | Discharge status: Home / Self Care | Payer: Self-pay | Source: Home / Self Care | Attending: Vascular Surgery

## 2021-09-24 ENCOUNTER — Ambulatory Visit (HOSPITAL_COMMUNITY)
Admission: RE | Admit: 2021-09-24 | Discharge: 2021-09-24 | Disposition: A | Discharge status: Home / Self Care | Payer: Medicare Other | Attending: Vascular Surgery | Admitting: Vascular Surgery

## 2021-09-24 ENCOUNTER — Other Ambulatory Visit: Payer: Self-pay

## 2021-09-24 DIAGNOSIS — I70213 Atherosclerosis of native arteries of extremities with intermittent claudication, bilateral legs: Secondary | ICD-10-CM | POA: Insufficient documentation

## 2021-09-24 DIAGNOSIS — Z7982 Long term (current) use of aspirin: Secondary | ICD-10-CM | POA: Diagnosis not present

## 2021-09-24 DIAGNOSIS — Z87891 Personal history of nicotine dependence: Secondary | ICD-10-CM | POA: Diagnosis not present

## 2021-09-24 DIAGNOSIS — I998 Other disorder of circulatory system: Secondary | ICD-10-CM | POA: Diagnosis not present

## 2021-09-24 DIAGNOSIS — I708 Atherosclerosis of other arteries: Secondary | ICD-10-CM | POA: Insufficient documentation

## 2021-09-24 DIAGNOSIS — S91301A Unspecified open wound, right foot, initial encounter: Secondary | ICD-10-CM | POA: Diagnosis not present

## 2021-09-24 DIAGNOSIS — I70223 Atherosclerosis of native arteries of extremities with rest pain, bilateral legs: Secondary | ICD-10-CM

## 2021-09-24 DIAGNOSIS — E1151 Type 2 diabetes mellitus with diabetic peripheral angiopathy without gangrene: Secondary | ICD-10-CM | POA: Insufficient documentation

## 2021-09-24 HISTORY — PX: ABDOMINAL AORTOGRAM W/LOWER EXTREMITY: CATH118223

## 2021-09-24 LAB — POCT I-STAT, CHEM 8
BUN: 23 mg/dL (ref 8–23)
Calcium, Ion: 1.29 mmol/L (ref 1.15–1.40)
Chloride: 100 mmol/L (ref 98–111)
Creatinine, Ser: 0.6 mg/dL — ABNORMAL LOW (ref 0.61–1.24)
Glucose, Bld: 113 mg/dL — ABNORMAL HIGH (ref 70–99)
HCT: 45 % (ref 39.0–52.0)
Hemoglobin: 15.3 g/dL (ref 13.0–17.0)
Potassium: 4.8 mmol/L (ref 3.5–5.1)
Sodium: 134 mmol/L — ABNORMAL LOW (ref 135–145)
TCO2: 24 mmol/L (ref 22–32)

## 2021-09-24 SURGERY — ABDOMINAL AORTOGRAM W/LOWER EXTREMITY
Anesthesia: LOCAL

## 2021-09-24 MED ORDER — HEPARIN SODIUM (PORCINE) 1000 UNIT/ML IJ SOLN
INTRAMUSCULAR | Status: DC | PRN
  Administered 2021-09-24: 3000 [IU] via INTRAVENOUS

## 2021-09-24 MED ORDER — HEPARIN (PORCINE) IN NACL 1000-0.9 UT/500ML-% IV SOLN
INTRAVENOUS | Status: AC
  Filled 2021-09-24: qty 500

## 2021-09-24 MED ORDER — IODIXANOL 320 MG/ML IV SOLN
INTRAVENOUS | Status: DC | PRN
  Administered 2021-09-24: 137 mL via INTRA_ARTERIAL

## 2021-09-24 MED ORDER — HYDRALAZINE HCL 20 MG/ML IJ SOLN: 5.0000 mg | INTRAMUSCULAR | Status: DC | PRN

## 2021-09-24 MED ORDER — ACETAMINOPHEN 325 MG PO TABS: 650.0000 mg | ORAL_TABLET | ORAL | Status: DC | PRN

## 2021-09-24 MED ORDER — FENTANYL CITRATE (PF) 100 MCG/2ML IJ SOLN
INTRAMUSCULAR | Status: DC | PRN
  Administered 2021-09-24: 50 ug via INTRAVENOUS

## 2021-09-24 MED ORDER — SODIUM CHLORIDE 0.9% FLUSH: 3.0000 mL | INTRAVENOUS | Status: DC | PRN

## 2021-09-24 MED ORDER — LABETALOL HCL 5 MG/ML IV SOLN: 10.0000 mg | INTRAVENOUS | Status: DC | PRN

## 2021-09-24 MED ORDER — LIDOCAINE HCL (PF) 1 % IJ SOLN
INTRAMUSCULAR | Status: DC | PRN
  Administered 2021-09-24: 5 mL

## 2021-09-24 MED ORDER — SODIUM CHLORIDE 0.9 % WEIGHT BASED INFUSION: 1.0000 mL/kg/h | INTRAVENOUS | Status: DC

## 2021-09-24 MED ORDER — LIDOCAINE HCL (PF) 1 % IJ SOLN
INTRAMUSCULAR | Status: AC
  Filled 2021-09-24: qty 30

## 2021-09-24 MED ORDER — ONDANSETRON HCL 4 MG/2ML IJ SOLN: 4.0000 mg | Freq: Four times a day (QID) | INTRAMUSCULAR | Status: DC | PRN

## 2021-09-24 MED ORDER — MIDAZOLAM HCL 2 MG/2ML IJ SOLN
INTRAMUSCULAR | Status: AC
  Filled 2021-09-24: qty 2

## 2021-09-24 MED ORDER — SODIUM CHLORIDE 0.9 % IV SOLN: 250.0000 mL | INTRAVENOUS | Status: DC | PRN

## 2021-09-24 MED ORDER — VERAPAMIL HCL 2.5 MG/ML IV SOLN
INTRAVENOUS | Status: DC | PRN
  Administered 2021-09-24: 10 mL via INTRA_ARTERIAL

## 2021-09-24 MED ORDER — MIDAZOLAM HCL 2 MG/2ML IJ SOLN
INTRAMUSCULAR | Status: DC | PRN
  Administered 2021-09-24: 1 mg via INTRAVENOUS

## 2021-09-24 MED ORDER — HEPARIN (PORCINE) IN NACL 1000-0.9 UT/500ML-% IV SOLN
INTRAVENOUS | Status: AC
  Filled 2021-09-24: qty 1000

## 2021-09-24 MED ORDER — SODIUM CHLORIDE 0.9 % IV SOLN: INTRAVENOUS | Status: DC

## 2021-09-24 MED ORDER — FENTANYL CITRATE (PF) 100 MCG/2ML IJ SOLN
INTRAMUSCULAR | Status: AC
  Filled 2021-09-24: qty 2

## 2021-09-24 MED ORDER — SODIUM CHLORIDE 0.9% FLUSH: 3.0000 mL | Freq: Two times a day (BID) | INTRAVENOUS | Status: DC

## 2021-09-24 MED ORDER — HEPARIN SODIUM (PORCINE) 1000 UNIT/ML IJ SOLN
INTRAMUSCULAR | Status: AC
  Filled 2021-09-24: qty 10

## 2021-09-24 MED ORDER — HEPARIN (PORCINE) IN NACL 1000-0.9 UT/500ML-% IV SOLN
INTRAVENOUS | Status: DC | PRN
  Administered 2021-09-24: 1000 mL

## 2021-09-24 SURGICAL SUPPLY — 11 items
CATH INFINITI 5F PIG 125CM (CATHETERS) IMPLANT
DEVICE RAD COMP TR BAND LRG (VASCULAR PRODUCTS) IMPLANT
GLIDESHEATH SLEND SS 6F .021 (SHEATH) IMPLANT
GLIDEWIRE ADV .035X260CM (WIRE) IMPLANT
KIT PV (KITS) ×1 IMPLANT
SHEATH PROBE COVER 6X72 (BAG) IMPLANT
STOPCOCK MORSE 400PSI 3WAY (MISCELLANEOUS) IMPLANT
SYR MEDRAD MARK V 150ML (SYRINGE) IMPLANT
TRANSDUCER W/STOPCOCK (MISCELLANEOUS) ×1 IMPLANT
TRAY PV CATH (CUSTOM PROCEDURE TRAY) ×1 IMPLANT
TUBING CONTRAST HIGH PRESS 48 (TUBING) IMPLANT

## 2021-09-24 NOTE — Op Note (Signed)
Patient name: [name] MRN: [id number] DOB: 28-Jun-1935 Sex: male  09/24/2021 Pre-operative Diagnosis: Right lower extremity critical ischemia with tissue loss Post-operative diagnosis:  Same Surgeon:  [name], MD Procedure Performed: 1.  Ultrasound-guided micropuncture access of the left radial artery 2.  Aortic angiogram 3.  Bilateral lower extremity angiogram  Indications: [name] is a 86 y.o. male presenting with bilateral lower extremity critical limb ischemia with right-sided tissue loss.   Recent CT angiogram demonstrated inflow disease in bilateral common iliac arteries, as well as nearly-occlusive lesions in bilateral common femoral arteries.  Runoff illustrated multilevel occlusive disease with significant atherosclerosis at the popliteal artery bilaterally.  Runoff was difficult to assess due to the amount of calcium but appeared to be posterior tibial bilaterally.   Had a long conversation with [name] and his daughter regarding the above.  At this point, I think he would be best served with bilateral lower extremity angiogram, with emphasis on the right from a left radial approach.  After discussing the risk and benefits, [name] elected to proceed.  This will be scheduled for Wednesday. The data from this angiogram provide insight into the repair needed to establish inline flow to the foot.  Findings:  Aortogram: Infrarenal aorta widely patent.,  Critical, 80%, flow-limiting stenosis in the right common iliac artery.  No flow-limiting stenosis in the left common iliac artery.critical stenosis at the ostia of the left hypogastric artery.  Right hypogastric artery patent, bilateral external iliac arteries with significant disease, however no flow-limiting stenosis.  On the right, severe, flow-limiting, atherosclerotic disease in the common femoral artery.  Patent profunda.  Superficial femoral artery with severe calcific disease, with multiple areas of flow-limiting  stenoses greater than 60%. Calcific disease continues into the popliteal artery and into tibial vessels.  Greater than 70% flow-limiting stenosis at the distal portion of the tibioperoneal trunk.  Two-vessel runoff  (PT and peroneal arteries) to the level of the ankle with posterior tibial artery continuing into the foot.  On the left: Severe, flow-limiting, atherosclerotic disease in the common femoral artery.  Patent profunda.  The superficial femoral artery occludes roughly 2 cm from its ostia, reconstituting in islands from the profunda.  The P2 segment of the popliteal artery reconstitutes and appears to give off the anterior tibial artery, peroneal, posterior tibial artery, however this is assumed as contrast is blocked by the patient's knee replacement.  Distally, there appears to be single-vessel posterior tibial artery runoff to the ankle.  Angiography was stopped prior to contrast filling the foot.   Procedure:  The patient was identified in the holding area and taken to room 8.  The patient was then placed supine on the table and prepped and draped in the usual sterile fashion.  A time out was called.  Ultrasound was used to evaluate the left radial artery.  Sound guided micropuncture needle was used to access the left radial artery in retrograde fashion.  From this, a slender 5 French sheath was placed.  The patient was heparinized with 3000 units IV heparin and a wire was run from the left radial artery into the terminal aorta.  A pigtail catheter was placed in the infrarenal aorta aorta.  Aortogram and bilateral lower extremity angiogram followed.  See results above. TR band was used for radial access management.  Impression: Patient needs right-sided common femoral endarterectomy, retrograde stenting, for multilevel occlusive disease.  I will discuss this with him.  At the time of surgery, I  am also considering left-sided common femoral artery endarterectomy, retrograde stenting to allow for  future endovascular intervention of SFA, TPT disease if wound healing does not occur.  Cassandria Santee, MD [facility] Office: [phone]

## 2021-09-24 NOTE — H&P (Signed)
Office Note    Patient seen and examined in preop holding.  No complaints. No changes to medication history or physical exam since last seen in clinic. After discussing the risks and benefits of bilateral lower extremity angiogram for critical limb ischemia with tissue loss, [name] elected to proceed.   Kenneth John MD   HPI: [name] is a 86 y.o. ([birth year]) male presenting in follow-up with known peripheral arterial disease and right lower extremity wounds.  At his last visit, he had nonpalpable pulses in the groin necessitating CT angiogram for central imaging.  He presents today with his daughter to discuss these findings.    On exam today, [name] was doing well.  He denied any recent changes in wound healing.  This is being followed by Dr. Jacqualyn Hood at [facility].  [name] was in a wheelchair today, but stated that was only because the chairs hurt his bottom.  He continues to ambulate independently.  The pt is not on a statin for cholesterol management.  The pt is on a daily aspirin.    The pt is  diabetic.  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   Psoas abscess, right ([place])    SDH (subdural hematoma) ([place]) 05/12/2014   Sepsis ([place]) 05/04/2014   Septic arthritis of hip ([place]) 05/18/2014   Stenosis of right carotid artery    40-59 percent    Streptococcal bacteremia 05/09/2014    Past Surgical History:  Procedure Laterality Date   BONE BIOPSY Right 08/13/2021   Procedure: BONE BIOPSY;  Surgeon: Trula Slade, DPM;  Location: [facility];  Service: Podiatry;  Laterality: Right;   HIP ARTHROPLASTY  Right    JOINT REPLACEMENT     right hip, bilateral knees   left femur surgery Left    with rod   right cataract extraction     TONSILLECTOMY     TOTAL SHOULDER ARTHROPLASTY Bilateral    WOUND DEBRIDEMENT Right 08/13/2021   Procedure: DEBRIDEMENT WOUND;  Surgeon: Trula Slade, DPM;  Location: [facility];  Service: Podiatry;  Laterality: Right;    Social History   Socioeconomic History   Marital status: Divorced    Spouse name: Not on file   Number of children: Not on file   Years of education: Not on file   Highest education level: Not on file  Occupational History   Not on file  Tobacco Use   Smoking status: Former    Packs/day: 25.00    Types: Cigarettes   Smokeless tobacco: Never  Vaping Use   Vaping Use: Never used  Substance and Sexual Activity   Alcohol use: Yes    Comment: 4 beers daily   Drug use: Not Currently   Sexual activity: Never  Other Topics Concern   Not on file  Social History Narrative   Not on file   Social Determinants of Health   Financial Resource Strain: Not on file  Food Insecurity: Not on file  Transportation Needs: Not on file  Physical Activity: Not on file  Stress: Not on file  Social  Connections: Not on file  Intimate Partner Violence: Not on file    Family History  Problem Relation Age of Onset   Diabetes Mellitus II Daughter     Current Facility-Administered Medications  Medication Dose Route Frequency Provider Last Rate Last Admin   0.9 %  sodium chloride infusion   Intravenous Continuous Kenneth John, MD 100 mL/hr at 09/24/21 1006 New Bag at 09/24/21 1006   fentaNYL (SUBLIMAZE) injection    PRN Kenneth John, MD   50 mcg at 09/24/21 1233   Heparin (Porcine) in NaCl 1000-0.9 UT/500ML-% SOLN    PRN Kenneth John, MD   1,000 mL at 09/24/21 1307   heparin sodium (porcine) injection    PRN Kenneth John, MD   3,000 Units at 09/24/21 1232   iodixanol (VISIPAQUE) 320 MG/ML injection    PRN Kenneth John, MD   137  mL at 09/24/21 1310   lidocaine (PF) (XYLOCAINE) 1 % injection    PRN Kenneth John, MD   5 mL at 09/24/21 1307   midazolam (VERSED) injection    PRN Kenneth John, MD   1 mg at 09/24/21 1230   Radial Cocktail/Verapamil only    PRN Kenneth John, MD   10 mL at 09/24/21 1232    Allergies  Allergen Reactions   Allopurinol Other (See Comments)    Makes head 'feel funny'   Atorvastatin Other (See Comments)   Motrin [Ibuprofen] Other (See Comments)    Blisters in groin area     REVIEW OF SYSTEMS:   '[X]'$  denotes positive finding, '[ ]'$  denotes negative finding Cardiac  Comments:  Chest pain or chest pressure:    Shortness of breath upon exertion:    Short of breath when lying flat:    Irregular heart rhythm:        Vascular    Pain in calf, thigh, or hip brought on by ambulation:    Pain in feet at night that wakes you up from your sleep:     Blood clot in your veins:    Leg swelling:         Pulmonary    Oxygen at home:    Productive cough:     Wheezing:         Neurologic    Sudden weakness in arms or legs:     Sudden numbness in arms or legs:     Sudden onset of difficulty speaking or slurred speech:    Temporary loss of vision in one eye:     Problems with dizziness:         Gastrointestinal    Blood in stool:     Vomited blood:         Genitourinary    Burning when urinating:     Blood in urine:        Psychiatric    Major depression:         Hematologic    Bleeding problems:    Problems with blood clotting too easily:        Skin    Rashes or ulcers:        Constitutional    Fever or chills:      PHYSICAL EXAMINATION:  Vitals:   09/24/21 1254 09/24/21 1259 09/24/21 1304 09/24/21 1309  BP: (!) 165/96 (!) 165/101 (!) 176/84 (!) 188/119  Pulse: 72 69 74 (!) 0  Resp: (!) '30 13 20   '$ SpO2: 96% 96% 97% 91%  Weight:      Height:        General:  WDWN in NAD; vital signs documented above Gait: Not observed HENT: WNL,  normocephalic Pulmonary: normal non-labored breathing , without wheezing Cardiac: irregular HR Abdomen: soft, NT, no masses Skin: without rashes Vascular Exam/Pulses:  Right Left  Radial 2+ (normal) 2+ (normal)  Ulnar            DP absent absent  PT absent absent   Extremities: with ischemic changes, without Gangrene , without cellulitis; with open wounds;  Wounds on the right foot, no wounds on the left foot. Musculoskeletal: no muscle wasting or atrophy  Neurologic: A&O X 3;  No focal weakness or paresthesias are detected Psychiatric:  The pt has Normal affect.   Non-Invasive Vascular Imaging:   ABI Findings:  +---------+------------------+-----+----------+--------+  Right    Rt Pressure (mmHg)IndexWaveform  Comment   +---------+------------------+-----+----------+--------+  Brachial 143                                        +---------+------------------+-----+----------+--------+  PTA      92                0.58 monophasic          +---------+------------------+-----+----------+--------+  DP       83                0.53 monophasic          +---------+------------------+-----+----------+--------+  Great Toe                       Abnormal            +---------+------------------+-----+----------+--------+   +---------+------------------+-----+----------+-------+  Left     Lt Pressure (mmHg)IndexWaveform  Comment  +---------+------------------+-----+----------+-------+  Brachial 158                                       +---------+------------------+-----+----------+-------+  PTA      83                0.53 monophasic         +---------+------------------+-----+----------+-------+  DP       78                0.49 monophasic         +---------+------------------+-----+----------+-------+  Great Toe65                0.41 Abnormal           +---------+------------------+-----+----------+-------+      ASSESSMENT/PLAN: BYRD RUSHLOW is a 86 y.o. male presenting with bilateral lower extremity critical limb ischemia with right-sided tissue loss.  Recent CT angiogram demonstrated inflow disease in bilateral common iliac arteries, as well as nearly-occlusive lesions in bilateral common femoral arteries.  Runoff illustrated multilevel occlusive disease with significant atherosclerosis at the popliteal artery bilaterally.  Runoff was difficult to assess due to the amount of calcium but appeared to be posterior tibial bilaterally.  Had a long conversation with [name] and his daughter regarding the above.  At this point, I think he would be best served with bilateral lower extremity angiogram, with emphasis on the right from a left radial approach.  After discussing the risk and benefits, [name] elected to proceed.  This will be  scheduled for Wednesday. The data from this angiogram provide insight into the repair needed to establish inline flow to the foot.  Kenneth John, MD [facility] [phone]

## 2021-09-24 NOTE — Progress Notes (Signed)
Lamping, Rad L. ([id number]) Visit Report for 09/23/2021 Chief Complaint Document Details Patient Name: Date of Service: [name], [name] 09/23/2021 12:30 PM Medical Record Number: [id number] Patient Account Number: [id number] Date of Birth/Sex: Treating RN: 1935-10-22 (86 y.o. M) Primary Care Provider: Dorthy Hood, Kenneth Other Clinician: Referring Provider: Treating Provider/Extender: Alanda Slim, [name] in Treatment: 4 Information Obtained from: Patient Chief Complaint 08/26/2021; right foot wound Electronic Signature(s) Signed: 09/23/2021 1:17:37 PM By: Kalman Shan DO Entered By: Kalman Shan on 09/23/2021 13:10:09 -------------------------------------------------------------------------------- HPI Details Patient Name: Date of Service: Kenneth Mons MES L. 09/23/2021 12:30 PM Medical Record Number: [id number] Patient Account Number: [id number] Date of Birth/Sex: Treating RN: Mar 09, 1935 (86 y.o. M) Primary Care Provider: Dorthy Hood, Kenneth Other Clinician: Referring Provider: Treating Provider/Extender: Alanda Slim, [name] in Treatment: 4 History of Present Illness HPI Description: Admission 08/26/2021 [name] is an 86 year old male with a past medical history of controlled type 2 diabetes on oral agents with peripheral neuropathy and Atrial fibrillation not on blood thinners that presents to the clinic with a right foot wound that has waxed and waned in healing over the past year. He has peripheral neuropathy and is not sure when the wound started or how it started. He had an MRI done by his primary care physician on 08/03/2021 that showed potential early acute osteomyelitis at the posterior calcaneus. He was seen by Dr. Linus Hood on 08/06/2021 and started on doxycycline and cefadroxil due to these findings. He was evaluated by podiatry, Dr. Jacqualyn Hood and had a bone biopsy done on 08/13/2021 that did not show evidence of osteomyelitis. He had a culture that  showed MRSA sensitive to doxycycline. He also had ABIs with TBI's done on 8/2 that showed an ABI of 0.58 and a noncompressible TBI. He is scheduled to see vein and vascular on 8/30. Currently he is keeping the wound covered. He is using a soft surgical shoe. He denies signs of infection. 8/5; patient presents for follow-up. He had follow-up with Dr. Linus Hood On 8/23. Plan is for 6 Hood of doxycycline through September 16. Although path was negative for osteo there is still concern due to positive culture from the bone. Unfortunately patient missed his vein and vascular appointment to assess blood flow status. This has been rescheduled for 9/8. Patient has been using Medihoney to the wound bed. He tries to offload the wound bed but has trouble at night keeping pressure off of it. He does not have Prevalon boots. He currently denies signs of infection. 9/19; patient presents for follow-up. He is scheduled to have a bilateral lower extremity angiogram on 9/20. He currently denies signs of infection. He has been using Medihoney to the wound bed. Electronic Signature(s) Signed: 09/23/2021 1:17:37 PM By: Kalman Shan DO Entered By: Kalman Shan on 09/23/2021 13:12:13 -------------------------------------------------------------------------------- Physical Exam Details Patient Name: Date of Service: Kenneth Mons MES L. 09/23/2021 12:30 PM Medical Record Number: [id number] Patient Account Number: [id number] Date of Birth/Sex: Treating RN: 02-Mar-1935 (86 y.o. M) Primary Care Provider: Dorthy Hood, Kenneth Other Clinician: Referring Provider: Treating Provider/Extender: Alanda Slim, [name] in Treatment: 4 Constitutional respirations regular, non-labored and within target range for patient.. Cardiovascular 2+ dorsalis pedis/posterior tibialis pulses. Psychiatric pleasant and cooperative. Notes Right foot: T the calcaneus there is a circular wound with granulation tissue throughout. No  surrounding signs of infection. o Electronic Signature(s) Signed: 09/23/2021 1:17:37 PM By: Kalman Shan DO Entered By: Kalman Shan on 09/23/2021 13:12:37 -------------------------------------------------------------------------------- Physician Orders Details Patient Name: Date of Service:  [name], [name] 09/23/2021 12:30 PM Medical Record Number: [id number] Patient Account Number: [id number] Date of Birth/Sex: Treating RN: [birth year] (86 y.o. Mare Ferrari Primary Care Provider: Dorthy Hood, Kenneth Other Clinician: Referring Provider: Treating Provider/Extender: Alanda Slim, [name] in Treatment: 4 Verbal / Phone Orders: No Diagnosis Coding Follow-up Appointments ppointment in 2 Hood. - Dr. Heber [place] and Leveda Anna, RN (Room 7) Return A Anesthetic (In clinic) Topical Lidocaine 5% applied to wound bed (In clinic) Topical Lidocaine 4% applied to wound bed Cellular or Tissue Based Products Other Cellular or Tissue Based Products Orders/Instructions: - Will run IVR for Organ[employer] products. Bathing/ Shower/ Hygiene May shower and wash wound with soap and water. Edema Control - Lymphedema / SCD / Other Avoid standing for long periods of time. Moisturize legs daily. Off-Loading Open toe surgical shoe to: - Wear socks with your shoes Other: - -Keep pressure off area as much as is possible -Will order Prevalon Boot Additional Orders / Instructions Follow Nutritious Diet Wound Treatment Wound #1 - Calcaneus Wound Laterality: Right Cleanser: Soap and Water 1 x Per Day/7 Days Discharge Instructions: May shower and wash wound with dial antibacterial soap and water prior to dressing change. Peri-Wound Care: Skin Prep (Generic) 1 x Per Day/7 Days Discharge Instructions: Use skin prep as directed Prim Dressing: Hydrofera Blue Ready Foam, 2.5 x2.5 in (DME) (Generic) 1 x Per Day/7 Days ary Discharge Instructions: Apply to wound bed as instructed Prim Dressing:  MediHoney Gel, tube 1.5 (oz) 1 x Per Day/7 Days ary Discharge Instructions: Apply to wound bed as instructed Secondary Dressing: ALLEVYN Gentle Border, 3x3 (in/in) (Dispense As Written) 1 x Per Day/7 Days Discharge Instructions: Apply over primary dressing as directed. Add-Ons: Cotton Tip Swabs (Generic) 1 x Per Day/7 Days Laboratory naerobe culture (MICRO) Bacteria identified in Unspecified specimen by A LOINC Code: 664-4 Convenience Name: Anaerobic culture Patient Medications llergies: allopurinol, atorvastatin, ibuprofen A Notifications Medication Indication Start End prior to debridement 09/23/2021 lidocaine DOSE topical 5 % ointment - ointment topical Electronic Signature(s) Signed: 09/23/2021 5:02:41 PM By: Sharyn Creamer RN, BSN Signed: 09/24/2021 1:20:57 PM By: Kalman Shan DO Previous Signature: 09/23/2021 4:16:01 PM Version By: Kalman Shan DO Previous Signature: 09/23/2021 1:17:37 PM Version By: Kalman Shan DO Entered By: Sharyn Creamer on 09/23/2021 16:21:19 Prescription 09/23/2021 -------------------------------------------------------------------------------- Dierdre Highman L. Kalman Shan DO Patient Name: Provider: Nov 12, 1935 [id number] Date of Birth: NPI#: Jerilynn Mages [id number] Sex: DEA #: [id number] Phone #: License #: [place] Patient Address: [address] [place], [place] [address] [place], [place] [address] [phone] Allergies allopurinol; atorvastatin; ibuprofen Provider's Orders Bacteria identified in Unspecified specimen by Anaerobe culture LOINC Code: 151-7 Convenience Name: Anaerobic culture Hand Signature: Date(s): Electronic Signature(s) Signed: 09/23/2021 5:02:41 PM By: Sharyn Creamer RN, BSN Signed: 09/24/2021 1:20:57 PM By: Kalman Shan DO Previous Signature: 09/23/2021 4:16:01 PM Version By: Kalman Shan DO Entered By: Sharyn Creamer on 09/23/2021  16:21:19 -------------------------------------------------------------------------------- Problem List Details Patient Name: Date of Service: Kenneth Mons MES L. 09/23/2021 12:30 PM Medical Record Number: [id number] Patient Account Number: [id number] Date of Birth/Sex: Treating RN: June 10, 1935 (86 y.o. M) Primary Care Provider: Dorthy Hood, Kenneth Other Clinician: Referring Provider: Treating Provider/Extender: Alanda Slim, [name] in Treatment: 4 Active Problems ICD-10 Encounter Code Description Active Date MDM Diagnosis E11.621 Type 2 diabetes mellitus with foot ulcer 08/26/2021 No Yes I73.9 Peripheral vascular disease, unspecified 08/26/2021 No Yes M86.171 Other acute osteomyelitis, right ankle and foot 08/26/2021 No Yes  E11.40 Type 2 diabetes mellitus with diabetic neuropathy, unspecified 08/26/2021 No Yes L97.512 Non-pressure chronic ulcer of other part of right foot with fat layer exposed 09/23/2021 No Yes Inactive Problems Resolved Problems Electronic Signature(s) Signed: 09/23/2021 1:17:37 PM By: Kalman Shan DO Entered By: Kalman Shan on 09/23/2021 13:16:47 -------------------------------------------------------------------------------- Progress Note Details Patient Name: Date of Service: Kenneth Mons MES L. 09/23/2021 12:30 PM Medical Record Number: [id number] Patient Account Number: [id number] Date of Birth/Sex: Treating RN: 1935-04-12 (86 y.o. M) Primary Care Provider: Dorthy Hood, Kenneth Other Clinician: Referring Provider: Treating Provider/Extender: Alanda Slim, [name] in Treatment: 4 Subjective Chief Complaint Information obtained from Patient 08/26/2021; right foot wound History of Present Illness (HPI) Admission 08/26/2021 Mr. Nareg Breighner is an 86 year old male with a past medical history of controlled type 2 diabetes on oral agents with peripheral neuropathy and Atrial fibrillation not on blood thinners that presents to the clinic  with a right foot wound that has waxed and waned in healing over the past year. He has peripheral neuropathy and is not sure when the wound started or how it started. He had an MRI done by his primary care physician on 08/03/2021 that showed potential early acute osteomyelitis at the posterior calcaneus. He was seen by Dr. Linus Hood on 08/06/2021 and started on doxycycline and cefadroxil due to these findings. He was evaluated by podiatry, Dr. Jacqualyn Hood and had a bone biopsy done on 08/13/2021 that did not show evidence of osteomyelitis. He had a culture that showed MRSA sensitive to doxycycline. He also had ABIs with TBI's done on 8/2 that showed an ABI of 0.58 and a noncompressible TBI. He is scheduled to see vein and vascular on 8/30. Currently he is keeping the wound covered. He is using a soft surgical shoe. He denies signs of infection. 8/5; patient presents for follow-up. He had follow-up with Dr. Linus Hood On 8/23. Plan is for 6 Hood of doxycycline through September 16. Although path was negative for osteo there is still concern due to positive culture from the bone. Unfortunately patient missed his vein and vascular appointment to assess blood flow status. This has been rescheduled for 9/8. Patient has been using Medihoney to the wound bed. He tries to offload the wound bed but has trouble at night keeping pressure off of it. He does not have Prevalon boots. He currently denies signs of infection. 9/19; patient presents for follow-up. He is scheduled to have a bilateral lower extremity angiogram on 9/20. He currently denies signs of infection. He has been using Medihoney to the wound bed. Patient History Information obtained from Patient, Chart. Family History Diabetes - Child, Stroke - Mother, No family history of Cancer, Heart Disease, Hereditary Spherocytosis, Hypertension, Kidney Disease, Lung Disease, Seizures, Thyroid Problems, Tuberculosis. Social History Former smoker, Marital Status -  Divorced, Alcohol Use - Moderate, Drug Use - Prior History, Caffeine Use - Daily. Medical History Eyes Patient has history of Cataracts - Surgery Cardiovascular Patient has history of Angina - AFib Endocrine Patient has history of Type II Diabetes Musculoskeletal Patient has history of Gout, Osteoarthritis Denies history of Rheumatoid Arthritis, Osteomyelitis Neurologic Patient has history of Neuropathy Medical A Surgical History Notes nd Cardiovascular Hx CVA Objective Constitutional respirations regular, non-labored and within target range for patient.. Vitals Time Taken: 12:45 PM, Height: 68 in, Weight: 198 lbs, BMI: 30.1, Temperature: 97.6 F, Pulse: 128 bpm, Respiratory Rate: 20 breaths/min, Blood Pressure: 155/80 mmHg. Cardiovascular 2+ dorsalis pedis/posterior tibialis pulses. Psychiatric pleasant and cooperative. General Notes: Right foot: T the  calcaneus there is a circular wound with granulation tissue throughout. No surrounding signs of infection. o Integumentary (Hair, Skin) Wound #1 status is Open. Original cause of wound was Gradually Appeared. The date acquired was: 06/05/2021. The wound has been in treatment 4 Hood. The wound is located on the Right Calcaneus. The wound measures 1.4cm length x 1.5cm width x 0.2cm depth; 1.649cm^2 area and 0.33cm^3 volume. There is Fat Layer (Subcutaneous Tissue) exposed. There is no tunneling or undermining noted. There is a medium amount of serosanguineous drainage noted. The wound margin is well defined and not attached to the wound base. There is large (67-100%) red granulation within the wound bed. There is a small (1-33%) amount of necrotic tissue within the wound bed including Adherent Slough. Assessment Active Problems ICD-10 Type 2 diabetes mellitus with foot ulcer Peripheral vascular disease, unspecified Other acute osteomyelitis, right ankle and foot Type 2 diabetes mellitus with diabetic neuropathy,  unspecified Non-pressure chronic ulcer of other part of right foot with fat layer exposed Patient's wound is stable. No signs of infection. I recommended continuing Medihoney and adding Hydrofera Blue. He was approved for organogenesis skin substitutes however co-insurance cost was too high. I think he would benefit from [facility] antibiotics and a PCR culture was obtained today. He is scheduled for An abdominal aortogram tomorrow. Plan 1. Medihoney with Hydrofera Blue 2. PCR culture for [facility] antibiotics 3. Follow-up in 2 Hood Electronic Signature(s) Signed: 09/23/2021 1:17:37 PM By: Kalman Shan DO Entered By: Kalman Shan on 09/23/2021 13:16:58 -------------------------------------------------------------------------------- HxROS Details Patient Name: Date of Service: Kenneth Mons MES L. 09/23/2021 12:30 PM Medical Record Number: [id number] Patient Account Number: [id number] Date of Birth/Sex: Treating RN: 12-20-1935 (86 y.o. M) Primary Care Provider: Dorthy Hood, Kenneth Other Clinician: Referring Provider: Treating Provider/Extender: Alanda Slim, [name] in Treatment: 4 Information Obtained From Patient Chart Eyes Medical History: Positive for: Cataracts - Surgery Cardiovascular Medical History: Positive for: Angina - AFib Past Medical History Notes: Hx CVA Endocrine Medical History: Positive for: Type II Diabetes Treated with: Oral agents, Diet Blood sugar tested every day: No Musculoskeletal Medical History: Positive for: Gout; Osteoarthritis Negative for: Rheumatoid Arthritis; Osteomyelitis Neurologic Medical History: Positive for: Neuropathy HBO Extended History Items Eyes: Cataracts Immunizations Pneumococcal Vaccine: Received Pneumococcal Vaccination: No Implantable Devices None Family and Social History Cancer: No; Diabetes: Yes - Child; Heart Disease: No; Hereditary Spherocytosis: No; Hypertension: No; Kidney Disease: No; Lung  Disease: No; Seizures: No; Stroke: Yes - Mother; Thyroid Problems: No; Tuberculosis: No; Former smoker; Marital Status - Divorced; Alcohol Use: Moderate; Drug Use: Prior History; Caffeine Use: Daily; Financial Concerns: No; Food, Clothing or Shelter Needs: No; Support System Lacking: No; Transportation Concerns: No Electronic Signature(s) Signed: 09/23/2021 1:17:37 PM By: Kalman Shan DO Entered By: Kalman Shan on 09/23/2021 13:12:19 -------------------------------------------------------------------------------- SuperBill Details Patient Name: Date of Service: Kenneth Mons MES L. 09/23/2021 Medical Record Number: [id number] Patient Account Number: [id number] Date of Birth/Sex: Treating RN: 05-Oct-1935 (86 y.o. M) Primary Care Provider: Dorthy Hood, Kenneth Other Clinician: Referring Provider: Treating Provider/Extender: Alanda Slim, [name] in Treatment: 4 Diagnosis Coding ICD-10 Codes Code Description E11.621 Type 2 diabetes mellitus with foot ulcer I73.9 Peripheral vascular disease, unspecified M86.171 Other acute osteomyelitis, right ankle and foot E11.40 Type 2 diabetes mellitus with diabetic neuropathy, unspecified L97.512 Non-pressure chronic ulcer of other part of right foot with fat layer exposed Facility Procedures CPT4 Code: [id number] Description: 99213 - WOUND CARE VISIT-LEV 3 EST PT Modifier: 25 Quantity: 1 Physician Procedures Electronic Signature(s) Signed: 09/23/2021 4:16:01  PM By: Kalman Shan DO Signed: 09/23/2021 5:02:41 PM By: Sharyn Creamer RN, BSN Previous Signature: 09/23/2021 1:17:37 PM Version By: Kalman Shan DO Entered By: Sharyn Creamer on 09/23/2021 13:23:32

## 2021-09-25 ENCOUNTER — Encounter (HOSPITAL_COMMUNITY): Payer: Self-pay | Admitting: Vascular Surgery

## 2021-09-26 ENCOUNTER — Telehealth: Payer: Self-pay | Admitting: Interventional Cardiology

## 2021-09-26 ENCOUNTER — Other Ambulatory Visit: Payer: Self-pay

## 2021-09-26 DIAGNOSIS — I70223 Atherosclerosis of native arteries of extremities with rest pain, bilateral legs: Secondary | ICD-10-CM

## 2021-09-26 NOTE — Telephone Encounter (Signed)
   Pre-operative Risk Assessment    Patient Name: [name]  DOB: 18-Nov-1935 MRN: [id number]      Request for Surgical Clearance    Procedure:   right LE critical ischemia with tissue loss  Date of Surgery:  Clearance 10/09/21                                 Surgeon:  Dr. Launa Grill Group [facility] Practice [facility] Phone number:  [phone] Fax number:  [phone]   Type of Clearance Requested:   - Medical    Type of Anesthesia:  General    Additional requests/questions:   patient has NP appt on Monday with dr Irish Lack   Signed, Milbert Coulter   09/26/2021, 12:16 PM

## 2021-09-27 ENCOUNTER — Other Ambulatory Visit: Payer: Self-pay | Admitting: Internal Medicine

## 2021-09-29 ENCOUNTER — Ambulatory Visit: Payer: Medicare Other | Admitting: Vascular Surgery

## 2021-09-29 ENCOUNTER — Encounter: Payer: Self-pay | Admitting: Interventional Cardiology

## 2021-09-29 ENCOUNTER — Ambulatory Visit: Payer: Medicare Other | Attending: Interventional Cardiology | Admitting: Interventional Cardiology

## 2021-09-29 VITALS — BP 132/70 | HR 93 | Ht 68.0 in | Wt 180.6 lb

## 2021-09-29 DIAGNOSIS — E782 Mixed hyperlipidemia: Secondary | ICD-10-CM | POA: Diagnosis not present

## 2021-09-29 DIAGNOSIS — I4891 Unspecified atrial fibrillation: Secondary | ICD-10-CM | POA: Diagnosis not present

## 2021-09-29 DIAGNOSIS — I739 Peripheral vascular disease, unspecified: Secondary | ICD-10-CM

## 2021-09-29 DIAGNOSIS — Z01818 Encounter for other preprocedural examination: Secondary | ICD-10-CM | POA: Diagnosis not present

## 2021-09-29 MED ORDER — ROSUVASTATIN CALCIUM 20 MG PO TABS: 20.0000 mg | ORAL_TABLET | Freq: Every day | ORAL | 3 refills | Status: DC

## 2021-09-29 MED FILL — Heparin Sod (Porcine)-NaCl IV Soln 1000 Unit/500ML-0.9%: INTRAVENOUS | Qty: 1000 | Status: AC

## 2021-09-29 NOTE — Telephone Encounter (Signed)
Per Dr. Henreitta Leber note, patient was to stop doxy 9/16 and follow up PRN.   Beryle Flock, RN

## 2021-09-29 NOTE — Progress Notes (Signed)
Cardiology Office Note   Date:  09/29/2021   ID:  [name], DOB [birth year], MRN [id number]  PCP:  Kenneth Amel, MD    No chief complaint on file.  Preoperative cardiovascular exam due to right lower extremity critical ischemia with tissue loss  Wt Readings from Last 3 Encounters:  09/29/21 180 lb 9.6 oz (81.9 kg)  09/24/21 185 lb (83.9 kg)  09/19/21 178 lb (80.7 kg)       History of Present Illness: [name] is a 86 y.o. male who is being seen today for the evaluation of preoperative cardiovascular exam as noted above at the request of Kenneth John, MD.   He has severe PAD.  Prior records from 2016 showed that he was seen by cardiology in the hospital.  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 Cards saw for atrial fib, new dx. Had  acute encephalopathy, then had [place] 05/07.  Pt had drainage of his psoas abscess today Still in atrial fib.  HR is wll controlled. BP is well controlled.    Still in atrial fib.  Not a candidate for anticoagulation No active cardiac issues. Will sign off. Call for questions "  He now requires vascular surgery.  Per Dr. Unk Lightning: "Patient needs right-sided common femoral endarterectomy, retrograde stenting, for multilevel occlusive disease.  I will discuss this with him.  At the time of surgery, I am also considering left-sided common femoral artery endarterectomy, retrograde stenting to allow for future endovascular intervention of SFA, TPT disease if wound healing does not occur."  Past Medical History:  Diagnosis Date   Acute encephalopathy 05/09/2014   Alcoholism ([place])    Arthritis    Cerebral infarction due to embolism of left middle cerebral artery ([place])    Diabetes mellitus type 2, controlled ([place]) 05/04/2014   Essential hypertension 05/04/2014   Gout 05/04/2014   Hypertension    New  onset atrial fibrillation ([place]) 05/04/2014   Psoas abscess ([place]) 05/04/2014   Psoas abscess, right ([facility])    SDH (subdural hematoma) ([place]) 05/12/2014   Sepsis ([place]) 05/04/2014   Septic arthritis of hip ([place]) 05/18/2014   Stenosis of right carotid artery    40-59 percent    Streptococcal bacteremia 05/09/2014    Past Surgical History:  Procedure Laterality Date   ABDOMINAL AORTOGRAM W/LOWER EXTREMITY N/A 09/24/2021   Procedure: ABDOMINAL AORTOGRAM W/LOWER EXTREMITY;  Surgeon: Kenneth John, MD;  Location: [place] [facility];  Service: Cardiovascular;  Laterality: N/A;   BONE BIOPSY Right 08/13/2021   Procedure: BONE BIOPSY;  Surgeon: Trula Slade, DPM;  Location: [facility];  Service: Podiatry;  Laterality: Right;   HIP ARTHROPLASTY Right    JOINT REPLACEMENT     right hip, bilateral knees   left femur surgery Left    with rod   right cataract extraction     TONSILLECTOMY     TOTAL SHOULDER ARTHROPLASTY Bilateral    WOUND DEBRIDEMENT Right 08/13/2021   Procedure: DEBRIDEMENT WOUND;  Surgeon: Trula Slade, DPM;  Location: [facility];  Service: Podiatry;  Laterality: Right;     Current Outpatient Medications  Medication Sig Dispense Refill   acidophilus (RISAQUAD) CAPS capsule Take 1 capsule by mouth daily.     ALPHA LIPOIC ACID PO Take 1 capsule by mouth 2 (two) times daily.     amoxicillin (  AMOXIL) 500 MG capsule TAKE 1 CAPSULE BY MOUTH TWICE A DAY 60 capsule 2   aspirin EC 81 MG tablet Take 81 mg by mouth every other day. Swallow whole.     atenolol (TENORMIN) 50 MG tablet Take 50 mg by mouth daily.     Cholecalciferol 25 MCG (1000 UT) capsule Take 1,000 Units by mouth daily.     collagenase (SANTYL) ointment Apply topically daily. 15 g 0   feeding supplement, ENSURE ENLIVE, (ENSURE ENLIVE) LIQD Take 237 mLs by mouth 2 (two) times daily between meals. 237 mL 12   fluticasone (FLONASE) 50 MCG/ACT nasal spray Place 2 sprays into both nostrils daily.     folic acid (FOLVITE) 1 MG  tablet Take 1 tablet (1 mg total) by mouth daily.     furosemide (LASIX) 20 MG tablet Take 20 mg by mouth daily.     furosemide (LASIX) 40 MG tablet Take 1 tablet (40 mg total) by mouth daily. 30 tablet 1   hydrALAZINE (APRESOLINE) 25 MG tablet Take 1 tablet (25 mg total) by mouth every 8 (eight) hours.     irbesartan (AVAPRO) 75 MG tablet Take 75 mg by mouth daily.     LYRICA 75 MG capsule Take 75-150 mg by mouth See admin instructions. Take 75 mg by mouth in the morning and 150 mg at night     meloxicam (MOBIC) 7.5 MG tablet Take 7.5 mg by mouth daily.     metFORMIN (GLUCOPHAGE) 1000 MG tablet Take 1,000 mg by mouth in the morning and at bedtime.     Multiple Vitamin (MULTIVITAMIN WITH MINERALS) TABS tablet Take 1 tablet by mouth daily.     polyethylene glycol (MIRALAX / GLYCOLAX) packet Take 17 g by mouth daily. 14 each 0   polyvinyl alcohol (LIQUIFILM TEARS) 1.4 % ophthalmic solution Place 1 drop into both eyes as needed for dry eyes.     potassium chloride SA (K-DUR,KLOR-CON) 20 MEQ tablet Take 1 tablet (20 mEq total) by mouth daily. 30 tablet 1   protein supplement shake (PREMIER PROTEIN) LIQD Take 11 oz by mouth daily.     rosuvastatin (CRESTOR) 20 MG tablet Take 1 tablet (20 mg total) by mouth daily. 90 tablet 3   saccharomyces boulardii (FLORASTOR) 250 MG capsule Take 1 capsule (250 mg total) by mouth 2 (two) times daily.     thiamine 100 MG tablet Take 1 tablet (100 mg total) by mouth daily.     traMADol (ULTRAM) 50 MG tablet Take 2 tablets (100 mg total) by mouth every 6 (six) hours as needed. Every 4 hours 30 tablet 0   ULORIC 80 MG TABS Take 80 mg by mouth daily.  3   Amino Acids-Protein Hydrolys (FEEDING SUPPLEMENT, PRO-STAT SUGAR FREE 64,) LIQD Place 30 mLs into feeding tube 2 (two) times daily. (Patient not taking: Reported on 09/29/2021) 900 mL 0   cefadroxil (DURICEF) 500 MG capsule Take 1 capsule (500 mg total) by mouth 2 (two) times daily. (Patient not taking: Reported on  09/29/2021) 60 capsule 1   doxycycline (VIBRA-TABS) 100 MG tablet Take 1 tablet (100 mg total) by mouth 2 (two) times daily. (Patient not taking: Reported on 09/29/2021) 60 tablet 1   Current Facility-Administered Medications  Medication Dose Route Frequency Provider Last Rate Last Admin   0.9 %  sodium chloride infusion  250 mL Intravenous PRN Kenneth John, MD       sodium chloride flush (NS) 0.9 % injection 3 mL  3  mL Intravenous Q12H Kenneth John, MD       sodium chloride flush (NS) 0.9 % injection 3 mL  3 mL Intravenous PRN Kenneth John, MD        Allergies:   Allopurinol, Atorvastatin, and Motrin [ibuprofen]    Social History:  The patient  reports that he has quit smoking. His smoking use included cigarettes. He smoked an average of 25 packs per day. He has never used smokeless tobacco. He reports current alcohol use. He reports that he does not currently use drugs.   Family History:  The patient's family history includes Diabetes Mellitus II in his daughter.    ROS:  Please see the history of present illness.   Otherwise, review of systems are positive for nonhealing ulcer on his right heel.   All other systems are reviewed and negative.    PHYSICAL EXAM: VS:  BP 132/70   Pulse 93   Ht '5\' 8"'$  (1.727 m)   Wt 180 lb 9.6 oz (81.9 kg)   SpO2 97%   BMI 27.46 kg/m  , BMI Body mass index is 27.46 kg/m. GEN: Well nourished, well developed, in no acute distress HEENT: normal Neck: no JVD, carotid bruits, or masses Cardiac: irregularly irregularly; no murmurs, rubs, or gallops,no edema  Respiratory:  clear to auscultation bilaterally, normal work of breathing GI: soft, nontender, nondistended, + BS MS: no deformity or atrophy Skin: warm and dry, discolored Neuro:  Strength and sensation are intact Psych: euthymic mood, full affect   EKG:   The ekg ordered today demonstrates AFib, rate controlled   Recent Labs: 08/11/2021: Platelets 279 09/24/2021: BUN 23;  Creatinine, Ser 0.60; Hemoglobin 15.3; Potassium 4.8; Sodium 134   Lipid Panel    Component Value Date/Time   CHOL 109 05/15/2014 0350   TRIG 81 05/15/2014 0350   HDL 24 (L) 05/15/2014 0350   CHOLHDL 4.5 05/15/2014 0350   VLDL 16 05/15/2014 0350   LDLCALC 69 05/15/2014 0350     Other studies Reviewed: Additional studies/ records that were reviewed today with results demonstrating: EF 55-60% in 2016.   ASSESSMENT AND PLAN:  Preoperative cardiovascular exam: It appears his surgery will be localized to the groins.  He will likely have iliac stenting as well at the same time, but this will be done endovascularly.  He does have multiple risk factors for heart disease.  Given his critical limb ischemia, the urgency of the surgery is increased.  Since the surgery is outside of his abdominal cavity, I think it is reasonable to proceed. Tolerated bone biopsy a few weks ago.   PAD: RIght heel nonhealing ulcer, been present for  Hyperlipidemia: Increase rosuvastatin to 20 mg daily for a LDL target of 70.  LDL was 111 at last check. Atrial fibrillation: Noted many years ago.  He was not thought to be a candidate for anticoagulation.  He has not had cardiology follow-up since that time when he was hospitalized in 2016.   Took warfarin for stroke prevention but due to bleeding around the eyes (sclera), this was stopped.  He does not remember what his INR was at the time.  He remains in atrial fibrillation today.  He has no symptoms.  Could consider trying a DOAC.  This may be better tolerated from a bleeding standpoint.  Need to assess his falls risk after.  Back in 2016 he was drinking heavily.  Anticoagulation was not considered.  If he is more steady on his feet after surgery  and not drinking heavily, could consider trial of Eliquis.  I believe he would get the 5 mg twice a day dose based on his current renal function and weight.  Since surgery is coming up and he still is having difficulty with a  nonhealing ulcer, will not start any anticoagulation at this time.   Current medicines are reviewed at length with the patient today.  The patient concerns regarding his medicines were addressed.  The following changes have been made:  No change  Labs/ tests ordered today include:   Orders Placed This Encounter  Procedures   Lipid Profile   Hepatic function panel   EKG 12-Lead    Recommend 150 minutes/week of aerobic exercise Low fat, low carb, high fiber diet recommended  Disposition:   FU in 2-3 months, need to decide on trying a different anticoagulant at that time   Signed, Larae Grooms, MD  09/29/2021 4:11 PM    [facility] [address], [place], [place]  [address] Phone: [phone]; Fax: [phone]

## 2021-09-29 NOTE — Patient Instructions (Addendum)
Medication Instructions:  Your physician has recommended you make the following change in your medication: increase Rosuvastatin to 20 mg by mouth daily   *If you need a refill on your cardiac medications before your next appointment, please call your pharmacy*   Lab Work: Your physician recommends that you return for lab work in: 3 months--Lipid and liver profiles.  This will be fasting.   If you have labs (blood work) drawn today and your tests are completely normal, you will receive your results only by: [place] (if you have MyChart) OR A paper copy in the mail If you have any lab test that is abnormal or we need to change your treatment, we will call you to review the results.   Testing/Procedures: none   Follow-Up: At [facility], you and your health needs are our priority.  As part of our continuing mission to provide you with exceptional heart care, we have created designated Provider Care Teams.  These Care Teams include your primary Cardiologist (physician) and Advanced Practice Providers (APPs -  Physician Assistants and Nurse Practitioners) who all work together to provide you with the care you need, when you need it.  We recommend signing up for the patient portal called "MyChart".  Sign up information is provided on this After Visit Summary.  MyChart is used to connect with patients for Virtual Visits (Telemedicine).  Patients are able to view lab/test results, encounter notes, upcoming appointments, etc.  Non-urgent messages can be sent to your provider as well.   To learn more about what you can do with MyChart, go to [email or web address].    Your next appointment:   December 2023 with APP  The format for your next appointment:   In Person  Provider:  APP   Other Instructions    Important Information About Sugar

## 2021-10-07 ENCOUNTER — Encounter (HOSPITAL_BASED_OUTPATIENT_CLINIC_OR_DEPARTMENT_OTHER): Payer: Medicare Other | Admitting: Internal Medicine

## 2021-10-07 NOTE — Progress Notes (Signed)
S.D.W- Instructions   Your procedure is scheduled on Thurs., Oct. 5, 2023 from 7:30AM-1:30PM.  Report to [facility] Main Entrance "A" at 5:30 A.M., then check in with the Admitting office.  Call this number if you have problems the morning of surgery:  [phone]   Remember:  Do not eat or drink after midnight on Oct. 4th    Take these medicines the morning of surgery with A SIP OF WATER: Aspirin EC Atenolol (TENORMIN) Fluticasone (FLONASE) HydrALAZINE (APRESOLINE)  LYRICA Rosuvastatin (CRESTOR) ULORIC   If Needed: Polyvinyl alcohol (LIQUIFILM TEARS) TraMADol (ULTRAM)  As of today, STOP taking any Aspirin (unless otherwise instructed by your surgeon) Aleve, Naproxen, Ibuprofen, Meloxicam (MOBIC), Motrin, Advil, Goody's, BC's, all herbal medications, fish oil, and all vitamins.  How to Manage Your Diabetes Before and After Surgery  How do I manage my blood sugar before surgery? Check your blood sugar the morning of your surgery when you wake up and every 2 hours until you get to the Short Stay unit. If your blood sugar is less than 70 mg/dL, you will need to treat for low blood sugar: Do not take insulin. Treat a low blood sugar (less than 70 mg/dL) with  cup of clear juice (cranberry or apple), 4 glucose tablets, OR glucose gel. Recheck blood sugar in 15 minutes after treatment (to make sure it is greater than 70 mg/dL). If your blood sugar is not greater than 70 mg/dL on recheck, call [phone]  for further instructions. Report your blood sugar to the short stay nurse when you get to Short Stay.  WHAT DO I DO ABOUT MY DIABETES MEDICATION?  Do not take MetFORMIN (GLUCOPHAGE)the morning of surgery.  If your CBG is greater than 220 mg/dL, inform the staff upon arrival to Pre-Op.  Reviewed and Endorsed by [facility] Patient Education Committee, August 2015  Do not wear jewelry. Do not wear lotions, powders,cologne or deodorant. Do not shave 48 hours prior to  surgery.  Men may shave face and neck. Do not bring valuables to the hospital.  [facility] is not responsible for any belongings or valuables.    Do NOT Smoke (Tobacco/Vaping)  24 hours prior to your procedure  If you use a CPAP at night, you may bring your mask for your overnight stay.   Contacts, glasses, hearing aids, dentures or partials may not be worn into surgery, please bring cases for these belongings   For patients admitted to the hospital, discharge time will be determined by your treatment team.   Patients discharged the day of surgery will not be allowed to drive home, and someone needs to stay with them for 24 hours.  Special instructions:    Oral Hygiene is also important to reduce your risk of infection.  Remember - BRUSH YOUR TEETH THE MORNING OF SURGERY WITH YOUR REGULAR TOOTHPASTE  [facility]- Preparing For Surgery  Before surgery, you can play an important role. Because skin is not sterile, your skin needs to be as free of germs as possible. You can reduce the number of germs on your skin by washing with Antibacterial Soap before surgery.     Please follow these instructions carefully.     Shower the NIGHT BEFORE SURGERY and the MORNING OF SURGERY with Antibacterial Soap.   Pat yourself dry with a CLEAN TOWEL.  Wear CLEAN PAJAMAS to bed the night before surgery  Place CLEAN SHEETS on your bed the night before your surgery  DO NOT SLEEP WITH PETS.  Day of Surgery:  Take a shower with Antibacterial soap. Wear Clean/Comfortable clothing the morning of surgery Do not apply any deodorants/lotions.   Remember to brush your teeth WITH YOUR REGULAR TOOTHPASTE.   If you test positive for Covid, or been in contact with anyone that has tested positive in the last 10 days, please notify your surgeon.  SURGICAL WAITING ROOM VISITATION Patients having surgery or a procedure may have no more than 2 support people in the waiting area - these visitors may rotate.    Children under the age of 97 must have an adult with them who is not the patient. If the patient needs to stay at the hospital during part of their recovery, the visitor guidelines for inpatient rooms apply. Pre-op nurse will coordinate an appropriate time for 1 support person to accompany patient in pre-op.  This support person may not rotate.   Please refer to the [facility] website for the visitor guidelines for Inpatients (after your surgery is over and you are in a regular room).

## 2021-10-08 NOTE — Anesthesia Preprocedure Evaluation (Addendum)
Anesthesia Evaluation  Patient identified by MRN, date of birth, ID band Patient awake    Reviewed: Allergy & Precautions, H&P , NPO status , Patient's Chart, lab work & pertinent test results, reviewed documented beta blocker date and time   Airway Mallampati: I  TM Distance: >3 FB Neck ROM: Full    Dental no notable dental hx. (+) Edentulous Upper, Edentulous Lower, Dental Advisory Given   Pulmonary neg pulmonary ROS, former smoker,    Pulmonary exam normal breath sounds clear to auscultation       Cardiovascular Exercise Tolerance: Good hypertension, Pt. on medications and Pt. on home beta blockers + Valvular Problems/Murmurs MR  Rhythm:Regular Rate:Normal     Neuro/Psych negative neurological ROS  negative psych ROS   GI/Hepatic negative GI ROS, Neg liver ROS,   Endo/Other  diabetes, Type 2, Oral Hypoglycemic Agents  Renal/GU negative Renal ROS  negative genitourinary   Musculoskeletal  (+) Arthritis , Osteoarthritis,    Abdominal   Peds  Hematology negative hematology ROS (+)   Anesthesia Other Findings   Reproductive/Obstetrics negative OB ROS                           Anesthesia Physical Anesthesia Plan  ASA: 3  Anesthesia Plan: General   Post-op Pain Management: Tylenol PO (pre-op)*   Induction: Intravenous  PONV Risk Score and Plan: 3 and Ondansetron, Dexamethasone and Treatment may vary due to age or medical condition  Airway Management Planned: Oral ETT  Additional Equipment: Arterial line  Intra-op Plan:   Post-operative Plan: Extubation in OR  Informed Consent: I have reviewed the patients History and Physical, chart, labs and discussed the procedure including the risks, benefits and alternatives for the proposed anesthesia with the patient or authorized representative who has indicated his/her understanding and acceptance.     Dental advisory given  Plan  Discussed with: CRNA  Anesthesia Plan Comments:        Anesthesia Quick Evaluation

## 2021-10-09 ENCOUNTER — Inpatient Hospital Stay (HOSPITAL_COMMUNITY): Payer: Medicare Other | Admitting: Anesthesiology

## 2021-10-09 ENCOUNTER — Other Ambulatory Visit: Payer: Self-pay

## 2021-10-09 ENCOUNTER — Encounter (HOSPITAL_COMMUNITY)
Admission: RE | Disposition: A | Discharge status: Home Health | Payer: Self-pay | Source: Home / Self Care | Attending: Vascular Surgery

## 2021-10-09 ENCOUNTER — Encounter (HOSPITAL_COMMUNITY): Payer: Self-pay | Admitting: Vascular Surgery

## 2021-10-09 ENCOUNTER — Inpatient Hospital Stay (HOSPITAL_COMMUNITY): Payer: Medicare Other

## 2021-10-09 ENCOUNTER — Inpatient Hospital Stay (HOSPITAL_COMMUNITY)
Admission: RE | Admit: 2021-10-09 | Discharge: 2021-10-12 | DRG: 271 | Disposition: A | Discharge status: Home Health | Payer: Medicare Other | Attending: Vascular Surgery | Admitting: Vascular Surgery

## 2021-10-09 DIAGNOSIS — Z96653 Presence of artificial knee joint, bilateral: Secondary | ICD-10-CM | POA: Diagnosis present

## 2021-10-09 DIAGNOSIS — Z87891 Personal history of nicotine dependence: Secondary | ICD-10-CM | POA: Diagnosis not present

## 2021-10-09 DIAGNOSIS — I10 Essential (primary) hypertension: Secondary | ICD-10-CM | POA: Diagnosis not present

## 2021-10-09 DIAGNOSIS — I70223 Atherosclerosis of native arteries of extremities with rest pain, bilateral legs: Secondary | ICD-10-CM | POA: Diagnosis not present

## 2021-10-09 DIAGNOSIS — Z888 Allergy status to other drugs, medicaments and biological substances status: Secondary | ICD-10-CM | POA: Diagnosis not present

## 2021-10-09 DIAGNOSIS — I70202 Unspecified atherosclerosis of native arteries of extremities, left leg: Secondary | ICD-10-CM | POA: Diagnosis not present

## 2021-10-09 DIAGNOSIS — Z833 Family history of diabetes mellitus: Secondary | ICD-10-CM

## 2021-10-09 DIAGNOSIS — L97419 Non-pressure chronic ulcer of right heel and midfoot with unspecified severity: Secondary | ICD-10-CM | POA: Diagnosis present

## 2021-10-09 DIAGNOSIS — I70221 Atherosclerosis of native arteries of extremities with rest pain, right leg: Secondary | ICD-10-CM | POA: Diagnosis present

## 2021-10-09 DIAGNOSIS — E1151 Type 2 diabetes mellitus with diabetic peripheral angiopathy without gangrene: Principal | ICD-10-CM | POA: Diagnosis present

## 2021-10-09 DIAGNOSIS — M109 Gout, unspecified: Secondary | ICD-10-CM | POA: Diagnosis not present

## 2021-10-09 DIAGNOSIS — Z8673 Personal history of transient ischemic attack (TIA), and cerebral infarction without residual deficits: Secondary | ICD-10-CM

## 2021-10-09 DIAGNOSIS — E11621 Type 2 diabetes mellitus with foot ulcer: Secondary | ICD-10-CM | POA: Diagnosis present

## 2021-10-09 DIAGNOSIS — Z7984 Long term (current) use of oral hypoglycemic drugs: Secondary | ICD-10-CM | POA: Diagnosis not present

## 2021-10-09 DIAGNOSIS — Z96641 Presence of right artificial hip joint: Secondary | ICD-10-CM | POA: Diagnosis present

## 2021-10-09 DIAGNOSIS — Z79899 Other long term (current) drug therapy: Secondary | ICD-10-CM

## 2021-10-09 DIAGNOSIS — Z886 Allergy status to analgesic agent status: Secondary | ICD-10-CM | POA: Diagnosis not present

## 2021-10-09 DIAGNOSIS — I70235 Atherosclerosis of native arteries of right leg with ulceration of other part of foot: Secondary | ICD-10-CM | POA: Diagnosis not present

## 2021-10-09 DIAGNOSIS — L97412 Non-pressure chronic ulcer of right heel and midfoot with fat layer exposed: Secondary | ICD-10-CM | POA: Diagnosis not present

## 2021-10-09 DIAGNOSIS — L97519 Non-pressure chronic ulcer of other part of right foot with unspecified severity: Secondary | ICD-10-CM | POA: Diagnosis not present

## 2021-10-09 DIAGNOSIS — I70222 Atherosclerosis of native arteries of extremities with rest pain, left leg: Secondary | ICD-10-CM | POA: Diagnosis not present

## 2021-10-09 HISTORY — PX: INSERTION OF ILIAC STENT: SHX6256

## 2021-10-09 HISTORY — PX: AORTOGRAM: SHX6300

## 2021-10-09 HISTORY — PX: APPLICATION OF WOUND VAC: SHX5189

## 2021-10-09 HISTORY — PX: ULTRASOUND GUIDANCE FOR VASCULAR ACCESS: SHX6516

## 2021-10-09 HISTORY — PX: ENDARTERECTOMY: SHX5162

## 2021-10-09 LAB — COMPREHENSIVE METABOLIC PANEL
ALT: 22 U/L (ref 0–44)
AST: 29 U/L (ref 15–41)
Albumin: 4.1 g/dL (ref 3.5–5.0)
Alkaline Phosphatase: 52 U/L (ref 38–126)
Anion gap: 10 (ref 5–15)
BUN: 21 mg/dL (ref 8–23)
CO2: 23 mmol/L (ref 22–32)
Calcium: 10.2 mg/dL (ref 8.9–10.3)
Chloride: 100 mmol/L (ref 98–111)
Creatinine, Ser: 0.66 mg/dL (ref 0.61–1.24)
GFR, Estimated: >60 mL/min (ref >60)
Glucose, Bld: 114 mg/dL — ABNORMAL HIGH (ref 70–99)
Potassium: 5.2 mmol/L — ABNORMAL HIGH (ref 3.5–5.1)
Sodium: 133 mmol/L — ABNORMAL LOW (ref 135–145)
Total Bilirubin: 0.9 mg/dL (ref 0.3–1.2)
Total Protein: 6.6 g/dL (ref 6.5–8.1)

## 2021-10-09 LAB — APTT: aPTT: 29 seconds (ref 24–36)

## 2021-10-09 LAB — POCT I-STAT 7, (LYTES, BLD GAS, ICA,H+H)
Acid-base deficit: 2 mmol/L (ref 0.0–2.0)
Bicarbonate: 22.7 mmol/L (ref 20.0–28.0)
Calcium, Ion: 1.31 mmol/L (ref 1.15–1.40)
HCT: 36 % — ABNORMAL LOW (ref 39.0–52.0)
Hemoglobin: 12.2 g/dL — ABNORMAL LOW (ref 13.0–17.0)
O2 Saturation: 100 %
Potassium: 4.3 mmol/L (ref 3.5–5.1)
Sodium: 132 mmol/L — ABNORMAL LOW (ref 135–145)
TCO2: 24 mmol/L (ref 22–32)
pCO2 arterial: 37.4 mmHg (ref 32–48)
pH, Arterial: 7.391 (ref 7.35–7.45)
pO2, Arterial: 230 mmHg — ABNORMAL HIGH (ref 83–108)

## 2021-10-09 LAB — GLUCOSE, CAPILLARY
Glucose-Capillary: 129 mg/dL — ABNORMAL HIGH (ref 70–99)
Glucose-Capillary: 144 mg/dL — ABNORMAL HIGH (ref 70–99)
Glucose-Capillary: 143 mg/dL — ABNORMAL HIGH (ref 70–99)
Glucose-Capillary: 177 mg/dL — ABNORMAL HIGH (ref 70–99)

## 2021-10-09 LAB — CBC
HCT: 40.6 % (ref 39.0–52.0)
Hemoglobin: 13.7 g/dL (ref 13.0–17.0)
MCH: 31.6 pg (ref 26.0–34.0)
MCHC: 33.7 g/dL (ref 30.0–36.0)
MCV: 93.5 fL (ref 80.0–100.0)
Platelets: 187 10*3/uL (ref 150–400)
RBC: 4.34 MIL/uL (ref 4.22–5.81)
RDW: 14.4 % (ref 11.5–15.5)
WBC: 4.4 10*3/uL (ref 4.0–10.5)
nRBC: 0 % (ref 0.0–0.2)

## 2021-10-09 LAB — SURGICAL PCR SCREEN
MRSA, PCR: POSITIVE — AB
Staphylococcus aureus: POSITIVE — AB

## 2021-10-09 LAB — POCT ACTIVATED CLOTTING TIME
Activated Clotting Time: 263 seconds
Activated Clotting Time: 233 seconds
Activated Clotting Time: 239 seconds
Activated Clotting Time: 251 seconds

## 2021-10-09 LAB — PROTIME-INR
INR: 1.1 (ref 0.8–1.2)
Prothrombin Time: 14.4 seconds (ref 11.4–15.2)

## 2021-10-09 LAB — PREPARE RBC (CROSSMATCH)

## 2021-10-09 SURGERY — ENDARTERECTOMY, ILIAC
Anesthesia: General | Site: Groin | Laterality: Bilateral

## 2021-10-09 MED ORDER — INSULIN ASPART 100 UNIT/ML IJ SOLN
0.0000 [IU] | Freq: Three times a day (TID) | INTRAMUSCULAR | Status: DC
  Administered 2021-10-09 – 2021-10-11 (×4): 2 [IU] via SUBCUTANEOUS

## 2021-10-09 MED ORDER — SODIUM CHLORIDE 0.9 % IV SOLN: 500.0000 mL | Freq: Once | INTRAVENOUS | Status: DC | PRN

## 2021-10-09 MED ORDER — ROCURONIUM BROMIDE 100 MG/10ML IV SOLN
INTRAVENOUS | Status: DC | PRN
  Administered 2021-10-09 (×2): 20 mg via INTRAVENOUS
  Administered 2021-10-09: 10 mg via INTRAVENOUS
  Administered 2021-10-09: 20 mg via INTRAVENOUS
  Administered 2021-10-09: 70 mg via INTRAVENOUS

## 2021-10-09 MED ORDER — ROSUVASTATIN CALCIUM 20 MG PO TABS
20.0000 mg | ORAL_TABLET | Freq: Every day | ORAL | Status: DC
  Administered 2021-10-10 – 2021-10-12 (×3): 20 mg via ORAL
  Filled 2021-10-09 (×3): qty 1

## 2021-10-09 MED ORDER — METFORMIN HCL 500 MG PO TABS
1000.0000 mg | ORAL_TABLET | Freq: Two times a day (BID) | ORAL | Status: DC
  Administered 2021-10-10 – 2021-10-12 (×5): 1000 mg via ORAL
  Filled 2021-10-09 (×5): qty 2

## 2021-10-09 MED ORDER — SUGAMMADEX SODIUM 200 MG/2ML IV SOLN
INTRAVENOUS | Status: DC | PRN
  Administered 2021-10-09: 200 mg via INTRAVENOUS

## 2021-10-09 MED ORDER — CEFAZOLIN SODIUM-DEXTROSE 2-4 GM/100ML-% IV SOLN
2.0000 g | INTRAVENOUS | Status: AC
  Administered 2021-10-09 (×2): 2 g via INTRAVENOUS
  Filled 2021-10-09: qty 100

## 2021-10-09 MED ORDER — MELOXICAM 7.5 MG PO TABS
7.5000 mg | ORAL_TABLET | Freq: Every day | ORAL | Status: DC
  Administered 2021-10-10 – 2021-10-12 (×3): 7.5 mg via ORAL
  Filled 2021-10-09 (×4): qty 1

## 2021-10-09 MED ORDER — ALUM & MAG HYDROXIDE-SIMETH 200-200-20 MG/5ML PO SUSP: 15.0000 mL | ORAL | Status: DC | PRN

## 2021-10-09 MED ORDER — HEPARIN SODIUM (PORCINE) 1000 UNIT/ML IJ SOLN
INTRAMUSCULAR | Status: AC
  Filled 2021-10-09: qty 10

## 2021-10-09 MED ORDER — CLOPIDOGREL BISULFATE 75 MG PO TABS
75.0000 mg | ORAL_TABLET | Freq: Every day | ORAL | Status: DC
  Administered 2021-10-10 – 2021-10-12 (×3): 75 mg via ORAL
  Filled 2021-10-09 (×3): qty 1

## 2021-10-09 MED ORDER — FUROSEMIDE 20 MG PO TABS
20.0000 mg | ORAL_TABLET | Freq: Every day | ORAL | Status: DC
  Administered 2021-10-10 – 2021-10-12 (×3): 20 mg via ORAL
  Filled 2021-10-09 (×3): qty 1

## 2021-10-09 MED ORDER — BISACODYL 5 MG PO TBEC: 5.0000 mg | DELAYED_RELEASE_TABLET | Freq: Every day | ORAL | Status: DC | PRN

## 2021-10-09 MED ORDER — DOCUSATE SODIUM 100 MG PO CAPS
100.0000 mg | ORAL_CAPSULE | Freq: Every day | ORAL | Status: DC
  Administered 2021-10-10 – 2021-10-12 (×3): 100 mg via ORAL
  Filled 2021-10-09 (×3): qty 1

## 2021-10-09 MED ORDER — METOPROLOL TARTRATE 5 MG/5ML IV SOLN: 2.0000 mg | INTRAVENOUS | Status: DC | PRN

## 2021-10-09 MED ORDER — ATENOLOL 25 MG PO TABS
50.0000 mg | ORAL_TABLET | Freq: Every day | ORAL | Status: DC
  Administered 2021-10-10 – 2021-10-12 (×2): 50 mg via ORAL
  Filled 2021-10-09 (×3): qty 2

## 2021-10-09 MED ORDER — SENNOSIDES-DOCUSATE SODIUM 8.6-50 MG PO TABS: 1.0000 | ORAL_TABLET | Freq: Every evening | ORAL | Status: DC | PRN

## 2021-10-09 MED ORDER — IRBESARTAN 150 MG PO TABS
75.0000 mg | ORAL_TABLET | Freq: Every day | ORAL | Status: DC
  Administered 2021-10-10 – 2021-10-12 (×2): 75 mg via ORAL
  Filled 2021-10-09 (×3): qty 1

## 2021-10-09 MED ORDER — HEMOSTATIC AGENTS (NO CHARGE) OPTIME
TOPICAL | Status: DC | PRN
  Administered 2021-10-09: 2 via TOPICAL
  Administered 2021-10-09: 1 via TOPICAL
  Administered 2021-10-09: 2 via TOPICAL

## 2021-10-09 MED ORDER — ONDANSETRON HCL 4 MG/2ML IJ SOLN: 4.0000 mg | Freq: Four times a day (QID) | INTRAMUSCULAR | Status: DC | PRN

## 2021-10-09 MED ORDER — HYDROMORPHONE HCL 1 MG/ML IJ SOLN
0.5000 mg | INTRAMUSCULAR | Status: DC | PRN
  Administered 2021-10-10: 1 mg via INTRAVENOUS
  Filled 2021-10-09: qty 1

## 2021-10-09 MED ORDER — PANTOPRAZOLE SODIUM 40 MG PO TBEC
40.0000 mg | DELAYED_RELEASE_TABLET | Freq: Every day | ORAL | Status: DC
  Administered 2021-10-10 – 2021-10-12 (×3): 40 mg via ORAL
  Filled 2021-10-09 (×3): qty 1

## 2021-10-09 MED ORDER — ACETAMINOPHEN 325 MG PO TABS: 325.0000 mg | ORAL_TABLET | ORAL | Status: DC | PRN

## 2021-10-09 MED ORDER — 0.9 % SODIUM CHLORIDE (POUR BTL) OPTIME
TOPICAL | Status: DC | PRN
  Administered 2021-10-09: 2000 mL

## 2021-10-09 MED ORDER — ONDANSETRON HCL 4 MG/2ML IJ SOLN
INTRAMUSCULAR | Status: DC | PRN
  Administered 2021-10-09: 4 mg via INTRAVENOUS

## 2021-10-09 MED ORDER — PROTAMINE SULFATE 10 MG/ML IV SOLN
INTRAVENOUS | Status: DC | PRN
  Administered 2021-10-09: 50 mg via INTRAVENOUS

## 2021-10-09 MED ORDER — VASOPRESSIN 20 UNIT/ML IV SOLN
INTRAVENOUS | Status: AC
  Filled 2021-10-09: qty 1

## 2021-10-09 MED ORDER — LIDOCAINE 2% (20 MG/ML) 5 ML SYRINGE
INTRAMUSCULAR | Status: DC | PRN
  Administered 2021-10-09: 60 mg via INTRAVENOUS

## 2021-10-09 MED ORDER — CHLORHEXIDINE GLUCONATE CLOTH 2 % EX PADS: 6.0000 | MEDICATED_PAD | Freq: Once | CUTANEOUS | Status: DC

## 2021-10-09 MED ORDER — CEFAZOLIN SODIUM-DEXTROSE 2-4 GM/100ML-% IV SOLN
2.0000 g | Freq: Three times a day (TID) | INTRAVENOUS | Status: AC
  Administered 2021-10-09 – 2021-10-10 (×2): 2 g via INTRAVENOUS
  Filled 2021-10-09 (×2): qty 100

## 2021-10-09 MED ORDER — PROPOFOL 10 MG/ML IV BOLUS
INTRAVENOUS | Status: AC
  Filled 2021-10-09: qty 20

## 2021-10-09 MED ORDER — PHENOL 1.4 % MT LIQD: 1.0000 | OROMUCOSAL | Status: DC | PRN

## 2021-10-09 MED ORDER — SODIUM CHLORIDE 0.9 % IV SOLN: INTRAVENOUS | Status: DC

## 2021-10-09 MED ORDER — FENTANYL CITRATE (PF) 250 MCG/5ML IJ SOLN
INTRAMUSCULAR | Status: AC
  Filled 2021-10-09: qty 5

## 2021-10-09 MED ORDER — LABETALOL HCL 5 MG/ML IV SOLN: 10.0000 mg | INTRAVENOUS | Status: DC | PRN

## 2021-10-09 MED ORDER — GUAIFENESIN-DM 100-10 MG/5ML PO SYRP: 15.0000 mL | ORAL_SOLUTION | ORAL | Status: DC | PRN

## 2021-10-09 MED ORDER — DEXAMETHASONE SODIUM PHOSPHATE 10 MG/ML IJ SOLN
INTRAMUSCULAR | Status: AC
  Filled 2021-10-09: qty 1

## 2021-10-09 MED ORDER — FENTANYL CITRATE (PF) 100 MCG/2ML IJ SOLN
INTRAMUSCULAR | Status: AC
  Filled 2021-10-09: qty 2

## 2021-10-09 MED ORDER — DEXAMETHASONE SODIUM PHOSPHATE 10 MG/ML IJ SOLN
INTRAMUSCULAR | Status: DC | PRN
  Administered 2021-10-09: 10 mg via INTRAVENOUS

## 2021-10-09 MED ORDER — ADULT MULTIVITAMIN W/MINERALS CH
1.0000 | ORAL_TABLET | Freq: Every day | ORAL | Status: DC
  Administered 2021-10-10 – 2021-10-12 (×3): 1 via ORAL
  Filled 2021-10-09 (×3): qty 1

## 2021-10-09 MED ORDER — EPHEDRINE SULFATE-NACL 50-0.9 MG/10ML-% IV SOSY
PREFILLED_SYRINGE | INTRAVENOUS | Status: DC | PRN
  Administered 2021-10-09 (×3): 5 mg via INTRAVENOUS

## 2021-10-09 MED ORDER — IODIXANOL 320 MG/ML IV SOLN
INTRAVENOUS | Status: DC | PRN
  Administered 2021-10-09: 47 mL

## 2021-10-09 MED ORDER — CHLORHEXIDINE GLUCONATE 0.12 % MT SOLN: 15.0000 mL | Freq: Once | OROMUCOSAL | Status: AC

## 2021-10-09 MED ORDER — FENTANYL CITRATE (PF) 100 MCG/2ML IJ SOLN
25.0000 ug | INTRAMUSCULAR | Status: DC | PRN
  Administered 2021-10-09: 50 ug via INTRAVENOUS

## 2021-10-09 MED ORDER — PREGABALIN 75 MG PO CAPS
75.0000 mg | ORAL_CAPSULE | Freq: Every day | ORAL | Status: DC
  Administered 2021-10-10 – 2021-10-12 (×3): 75 mg via ORAL
  Filled 2021-10-09 (×3): qty 1

## 2021-10-09 MED ORDER — SODIUM CHLORIDE 0.9 % IV SOLN: 10.0000 mL/h | Freq: Once | INTRAVENOUS | Status: DC

## 2021-10-09 MED ORDER — PROPOFOL 10 MG/ML IV BOLUS
INTRAVENOUS | Status: DC | PRN
  Administered 2021-10-09: 100 mg via INTRAVENOUS
  Administered 2021-10-09: 30 mg via INTRAVENOUS
  Administered 2021-10-09: 40 mg via INTRAVENOUS
  Administered 2021-10-09: 30 mg via INTRAVENOUS

## 2021-10-09 MED ORDER — MAGNESIUM CITRATE PO SOLN: 1.0000 | Freq: Once | ORAL | Status: DC | PRN

## 2021-10-09 MED ORDER — PHENYLEPHRINE HCL-NACL 20-0.9 MG/250ML-% IV SOLN
INTRAVENOUS | Status: DC | PRN
  Administered 2021-10-09: 40 ug/min via INTRAVENOUS

## 2021-10-09 MED ORDER — ORAL CARE MOUTH RINSE: 15.0000 mL | Freq: Once | OROMUCOSAL | Status: AC

## 2021-10-09 MED ORDER — POTASSIUM CHLORIDE CRYS ER 20 MEQ PO TBCR: 20.0000 meq | EXTENDED_RELEASE_TABLET | Freq: Every day | ORAL | Status: DC | PRN

## 2021-10-09 MED ORDER — ACETAMINOPHEN 650 MG RE SUPP: 325.0000 mg | RECTAL | Status: DC | PRN

## 2021-10-09 MED ORDER — LACTATED RINGERS IV SOLN: INTRAVENOUS | Status: DC

## 2021-10-09 MED ORDER — MAGNESIUM SULFATE 2 GM/50ML IV SOLN: 2.0000 g | Freq: Every day | INTRAVENOUS | Status: DC | PRN

## 2021-10-09 MED ORDER — HEPARIN 6000 UNIT IRRIGATION SOLUTION
Status: DC | PRN
  Administered 2021-10-09: 1

## 2021-10-09 MED ORDER — FEBUXOSTAT 40 MG PO TABS
80.0000 mg | ORAL_TABLET | Freq: Every day | ORAL | Status: DC
  Administered 2021-10-10 – 2021-10-12 (×3): 80 mg via ORAL
  Filled 2021-10-09 (×4): qty 2

## 2021-10-09 MED ORDER — HEPARIN SODIUM (PORCINE) 1000 UNIT/ML IJ SOLN
INTRAMUSCULAR | Status: DC | PRN
  Administered 2021-10-09 (×2): 2000 [IU] via INTRAVENOUS
  Administered 2021-10-09: 8000 [IU] via INTRAVENOUS

## 2021-10-09 MED ORDER — LACTATED RINGERS IV SOLN: INTRAVENOUS | Status: DC | PRN

## 2021-10-09 MED ORDER — FEBUXOSTAT 80 MG PO TABS: 80.0000 mg | ORAL_TABLET | Freq: Every day | ORAL | Status: DC

## 2021-10-09 MED ORDER — ACETAMINOPHEN 500 MG PO TABS
1000.0000 mg | ORAL_TABLET | Freq: Once | ORAL | Status: AC
  Administered 2021-10-09: 1000 mg via ORAL
  Filled 2021-10-09: qty 2

## 2021-10-09 MED ORDER — PREGABALIN 75 MG PO CAPS: 75.0000 mg | ORAL_CAPSULE | Freq: Every day | ORAL | Status: DC

## 2021-10-09 MED ORDER — AMOXICILLIN 500 MG PO CAPS: 500.0000 mg | ORAL_CAPSULE | Freq: Two times a day (BID) | ORAL | Status: DC

## 2021-10-09 MED ORDER — CEFAZOLIN SODIUM 1 G IJ SOLR
INTRAMUSCULAR | Status: AC
  Filled 2021-10-09: qty 20

## 2021-10-09 MED ORDER — CLEVIDIPINE BUTYRATE 0.5 MG/ML IV EMUL
INTRAVENOUS | Status: DC | PRN
  Administered 2021-10-09: 1 mg/h via INTRAVENOUS

## 2021-10-09 MED ORDER — CHLORHEXIDINE GLUCONATE 0.12 % MT SOLN
OROMUCOSAL | Status: AC
  Administered 2021-10-09: 15 mL via OROMUCOSAL
  Filled 2021-10-09: qty 15

## 2021-10-09 MED ORDER — HEPARIN SODIUM (PORCINE) 5000 UNIT/ML IJ SOLN
5000.0000 [IU] | Freq: Three times a day (TID) | INTRAMUSCULAR | Status: DC
  Administered 2021-10-10 – 2021-10-12 (×7): 5000 [IU] via SUBCUTANEOUS
  Filled 2021-10-09 (×7): qty 1

## 2021-10-09 MED ORDER — ROCURONIUM BROMIDE 10 MG/ML (PF) SYRINGE
PREFILLED_SYRINGE | INTRAVENOUS | Status: AC
  Filled 2021-10-09: qty 10

## 2021-10-09 MED ORDER — HYDRALAZINE HCL 20 MG/ML IJ SOLN: 5.0000 mg | INTRAMUSCULAR | Status: DC | PRN

## 2021-10-09 MED ORDER — PHENYLEPHRINE 80 MCG/ML (10ML) SYRINGE FOR IV PUSH (FOR BLOOD PRESSURE SUPPORT)
PREFILLED_SYRINGE | INTRAVENOUS | Status: DC | PRN
  Administered 2021-10-09 (×4): 80 ug via INTRAVENOUS
  Administered 2021-10-09: 160 ug via INTRAVENOUS
  Administered 2021-10-09: 80 ug via INTRAVENOUS
  Administered 2021-10-09 (×2): 40 ug via INTRAVENOUS
  Administered 2021-10-09: 80 ug via INTRAVENOUS

## 2021-10-09 MED ORDER — HEPARIN 6000 UNIT IRRIGATION SOLUTION
Status: AC
  Filled 2021-10-09: qty 500

## 2021-10-09 MED ORDER — INSULIN ASPART 100 UNIT/ML IJ SOLN: 0.0000 [IU] | Freq: Every day | INTRAMUSCULAR | Status: DC

## 2021-10-09 MED ORDER — PREGABALIN 75 MG PO CAPS
150.0000 mg | ORAL_CAPSULE | Freq: Every day | ORAL | Status: DC
  Administered 2021-10-09 – 2021-10-11 (×3): 150 mg via ORAL
  Filled 2021-10-09 (×2): qty 2

## 2021-10-09 MED ORDER — CEFADROXIL 500 MG PO CAPS
500.0000 mg | ORAL_CAPSULE | Freq: Two times a day (BID) | ORAL | Status: DC
  Filled 2021-10-09: qty 1

## 2021-10-09 MED ORDER — FENTANYL CITRATE (PF) 100 MCG/2ML IJ SOLN
INTRAMUSCULAR | Status: DC | PRN
  Administered 2021-10-09 (×6): 50 ug via INTRAVENOUS

## 2021-10-09 MED ORDER — TRAMADOL HCL 50 MG PO TABS
50.0000 mg | ORAL_TABLET | Freq: Four times a day (QID) | ORAL | Status: DC | PRN
  Administered 2021-10-10 – 2021-10-11 (×5): 50 mg via ORAL
  Filled 2021-10-09 (×8): qty 1

## 2021-10-09 MED ORDER — HYDRALAZINE HCL 25 MG PO TABS
25.0000 mg | ORAL_TABLET | Freq: Two times a day (BID) | ORAL | Status: DC
  Administered 2021-10-09 – 2021-10-12 (×5): 25 mg via ORAL
  Filled 2021-10-09 (×6): qty 1

## 2021-10-09 MED ORDER — GLYCOPYRROLATE 0.2 MG/ML IJ SOLN
INTRAMUSCULAR | Status: DC | PRN
  Administered 2021-10-09: .1 mg via INTRAVENOUS

## 2021-10-09 MED ORDER — ASPIRIN 81 MG PO TBEC
81.0000 mg | DELAYED_RELEASE_TABLET | ORAL | Status: DC
  Administered 2021-10-10 – 2021-10-12 (×2): 81 mg via ORAL
  Filled 2021-10-09 (×2): qty 1

## 2021-10-09 SURGICAL SUPPLY — 84 items
ADH SKN CLS APL DERMABOND .7 (GAUZE/BANDAGES/DRESSINGS) ×4
AGENT HMST KT MTR STRL THRMB (HEMOSTASIS) ×2
APL PRP STRL LF DISP 70% ISPRP (MISCELLANEOUS) ×2
BAG COUNTER SPONGE SURGICOUNT (BAG) ×2 IMPLANT
BAG SPNG CNTER NS LX DISP (BAG) ×2
BALLN MUSTANG 4.0X40 75 (BALLOONS) ×2
BALLN MUSTANG 5X60X75 (BALLOONS) ×2
BALLOON MUSTANG 4.0X40 75 (BALLOONS) IMPLANT
BALLOON MUSTANG 5X60X75 (BALLOONS) IMPLANT
BNDG ELASTIC 4X5.8 VLCR STR LF (GAUZE/BANDAGES/DRESSINGS) IMPLANT
CANISTER SUCT 3000ML PPV (MISCELLANEOUS) ×2 IMPLANT
CANISTER WOUNDNEG PRESSURE 500 (CANNISTER) IMPLANT
CATH BEACON 5 .035 65 KMP TIP (CATHETERS) IMPLANT
CATH OMNI FLUSH .035X70CM (CATHETERS) IMPLANT
CATH OMNI FLUSH 5F 65CM (CATHETERS) ×2 IMPLANT
CHLORAPREP W/TINT 26 (MISCELLANEOUS) ×2 IMPLANT
CLIP LIGATING EXTRA MED SLVR (CLIP) IMPLANT
CLIP LIGATING EXTRA SM BLUE (MISCELLANEOUS) IMPLANT
CONNECTOR Y WND VAC (MISCELLANEOUS) IMPLANT
COVER BACK TABLE 80X110 HD (DRAPES) IMPLANT
COVER DOME SNAP 22 D (MISCELLANEOUS) IMPLANT
COVER PROBE W GEL 5X96 (DRAPES) IMPLANT
COVER SURGICAL LIGHT HANDLE (MISCELLANEOUS) IMPLANT
DERMABOND ADVANCED .7 DNX12 (GAUZE/BANDAGES/DRESSINGS) IMPLANT
DRAIN CHANNEL 15F RND FF W/TCR (WOUND CARE) IMPLANT
DRAPE C-ARM 42X72 X-RAY (DRAPES) IMPLANT
DRAPE INCISE IOBAN 66X45 STRL (DRAPES) IMPLANT
DRESSING PEEL AND PLC PRVNA 13 (GAUZE/BANDAGES/DRESSINGS) IMPLANT
DRSG PEEL AND PLACE PREVENA 13 (GAUZE/BANDAGES/DRESSINGS) ×4
ELECT REM PT RETURN 9FT ADLT (ELECTROSURGICAL) ×2
ELECTRODE REM PT RTRN 9FT ADLT (ELECTROSURGICAL) ×2 IMPLANT
EVACUATOR SILICONE 100CC (DRAIN) IMPLANT
GLIDEWIRE ADV .035X180CM (WIRE) IMPLANT
GLOVE BIO SURGEON STRL SZ7.5 (GLOVE) ×2 IMPLANT
GLOVE BIOGEL PI IND STRL 8 (GLOVE) ×2 IMPLANT
GLOVE SRG 8 PF TXTR STRL LF DI (GLOVE) ×2 IMPLANT
GLOVE SURG POLYISO LF SZ8 (GLOVE) IMPLANT
GLOVE SURG UNDER POLY LF SZ8 (GLOVE) ×2
GOWN STRL REUS W/ TWL LRG LVL3 (GOWN DISPOSABLE) ×6 IMPLANT
GOWN STRL REUS W/TWL 2XL LVL3 (GOWN DISPOSABLE) ×4 IMPLANT
GOWN STRL REUS W/TWL LRG LVL3 (GOWN DISPOSABLE) ×6
GRAFT VASC PATCH XENOSURE 1X14 (Vascular Products) IMPLANT
HEMOSTAT SNOW SURGICEL 2X4 (HEMOSTASIS) IMPLANT
KIT BASIN OR (CUSTOM PROCEDURE TRAY) ×2 IMPLANT
KIT ENCORE 26 ADVANTAGE (KITS) IMPLANT
KIT NAMIC PS PRESSURIZED FLUID (KITS) IMPLANT
KIT TURNOVER KIT B (KITS) ×2 IMPLANT
NDL PERC 18GX7CM (NEEDLE) IMPLANT
NEEDLE PERC 18GX7CM (NEEDLE) ×2 IMPLANT
NS IRRIG 1000ML POUR BTL (IV SOLUTION) ×4 IMPLANT
PACK ENDO MINOR (CUSTOM PROCEDURE TRAY) ×2 IMPLANT
PACK PERIPHERAL VASCULAR (CUSTOM PROCEDURE TRAY) ×2 IMPLANT
PAD ARMBOARD 7.5X6 YLW CONV (MISCELLANEOUS) ×4 IMPLANT
SET MICROPUNCTURE 5F STIFF (MISCELLANEOUS) IMPLANT
SHEATH DESTINATION 8F 45CM (SHEATH) IMPLANT
SHEATH PINNACLE 5F 10CM (SHEATH) ×2 IMPLANT
SHEATH PINNACLE 8F 10CM (SHEATH) ×2 IMPLANT
SPONGE T-LAP 18X18 ~~LOC~~+RFID (SPONGE) IMPLANT
STAPLER VISISTAT 35W (STAPLE) IMPLANT
STENT VIABAHN 9X29X80 VBX (Permanent Stent) IMPLANT
STENT VIABAHNBX 9X29X135 (Permanent Stent) IMPLANT
SURGIFLO W/THROMBIN 8M KIT (HEMOSTASIS) IMPLANT
SUT MNCRL AB 4-0 PS2 18 (SUTURE) ×2 IMPLANT
SUT PROLENE 5 0 C 1 24 (SUTURE) ×2 IMPLANT
SUT PROLENE 5 0 C 1 36 (SUTURE) IMPLANT
SUT PROLENE 6 0 BV (SUTURE) IMPLANT
SUT SILK 3 0 SH CR/8 (SUTURE) IMPLANT
SUT VIC AB 2-0 CT1 27 (SUTURE) ×12
SUT VIC AB 2-0 CT1 TAPERPNT 27 (SUTURE) ×2 IMPLANT
SUT VIC AB 3-0 SH 27 (SUTURE) ×8
SUT VIC AB 3-0 SH 27X BRD (SUTURE) ×2 IMPLANT
SYR 10ML LL (SYRINGE) IMPLANT
SYR 20ML LL LF (SYRINGE) IMPLANT
SYR MEDRAD MARK V 150ML (SYRINGE) ×2 IMPLANT
TOWEL GREEN STERILE (TOWEL DISPOSABLE) ×2 IMPLANT
TRAY FOLEY MTR SLVR 16FR STAT (SET/KITS/TRAYS/PACK) ×2 IMPLANT
TUBE CONNECTING 12X1/4 (SUCTIONS) IMPLANT
TUBING HIGH PRESSURE 120CM (CONNECTOR) IMPLANT
UNDERPAD 30X36 HEAVY ABSORB (UNDERPADS AND DIAPERS) ×2 IMPLANT
WATER STERILE IRR 1000ML POUR (IV SOLUTION) ×2 IMPLANT
WIRE AMPLATZ SS-J .035X180CM (WIRE) IMPLANT
WIRE AMPLATZ SS-J .035X260CM (WIRE) IMPLANT
WIRE BENTSON .035X145CM (WIRE) ×2 IMPLANT
WIRE ROSEN-J .035X260CM (WIRE) IMPLANT

## 2021-10-09 NOTE — Anesthesia Postprocedure Evaluation (Signed)
Anesthesia Post Note  Patient: [name]  Procedure(s) Performed: Raylene Everts ENDARTERECTOMY (Bilateral: Groin) INSERTION OF RETROGRADE ILIAC STENT (Bilateral: Groin) ULTRASOUND GUIDANCE FOR VASCULAR ACCESS (Bilateral: Groin) AORTOGRAM (Abdomen) APPLICATION OF WOUND VAC (Bilateral: Groin)     Patient location during evaluation: PACU Anesthesia Type: General Level of consciousness: awake and alert Pain management: pain level controlled Vital Signs Assessment: post-procedure vital signs reviewed and stable Respiratory status: spontaneous breathing, nonlabored ventilation and respiratory function stable Cardiovascular status: blood pressure returned to baseline and stable Postop Assessment: no apparent nausea or vomiting Anesthetic complications: no   No notable events documented.  Last Vitals:  Vitals:   10/09/21 1300 10/09/21 1315  BP: 117/71 132/74  Pulse: 94 87  Resp: 11 10  Temp:  36.7 C  SpO2: 97% 98%    Last Pain:  Vitals:   10/09/21 1230  TempSrc:   PainSc: 0-No pain                 Riven Beebe,W. EDMOND

## 2021-10-09 NOTE — H&P (Addendum)
Office Note   Patient seen and examined in preop holding.  No complaints. No changes to medication history or physical exam since last seen in clinic. In short, [name] is an 86 year old male with bilateral lower extremity critical limb ischemia, tissue loss on the right foot, currently being managed by podiatry.  Recent angiography demonstrated severe inflow disease which would be best treated with bilateral iliofemoral endarterectomies, retrograde stenting.  After discussing the risk and benefits of this operation both he and his daughter, he elected to proceed.    Kenneth John MD   HPI: [name] is a 86 y.o. (20-May-1935) male presenting in follow-up with known peripheral arterial disease and right lower extremity wounds.  At his last visit, he had nonpalpable pulses in the groin necessitating CT angiogram for central imaging.  He presents today with his daughter to discuss these findings.    On exam today, [name] was doing well.  He denied any recent changes in wound healing.  This is being followed by Dr. Jacqualyn Posey at [facility].  [name] was in a wheelchair today, but stated that was only because the chairs hurt his bottom.  He continues to ambulate independently.  The pt is not on a statin for cholesterol management.  The pt is on a daily aspirin.    The pt is  diabetic.  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   Psoas abscess, right ([facility])    SDH (subdural hematoma) ([place]) 05/12/2014   Sepsis ([place]) 05/04/2014   Septic arthritis of hip ([place]) 05/18/2014   Stenosis of right carotid artery    40-59 percent     Streptococcal bacteremia 05/09/2014    Past Surgical History:  Procedure Laterality Date   ABDOMINAL AORTOGRAM W/LOWER EXTREMITY N/A 09/24/2021   Procedure: ABDOMINAL AORTOGRAM W/LOWER EXTREMITY;  Surgeon: Kenneth John, MD;  Location: [facility];  Service: Cardiovascular;  Laterality: N/A;   BONE BIOPSY Right 08/13/2021   Procedure: BONE BIOPSY;  Surgeon: Trula Slade, DPM;  Location: [facility];  Service: Podiatry;  Laterality: Right;   HIP ARTHROPLASTY Right    JOINT REPLACEMENT     right hip, bilateral knees   left femur surgery Left    with rod   right cataract extraction     TONSILLECTOMY     TOTAL SHOULDER ARTHROPLASTY Bilateral    WOUND DEBRIDEMENT Right 08/13/2021   Procedure: DEBRIDEMENT WOUND;  Surgeon: Trula Slade, DPM;  Location: [facility];  Service: Podiatry;  Laterality: Right;    Social History   Socioeconomic History   Marital status: Divorced    Spouse name: Not on file   Number of children: Not on file   Years of education: Not on file   Highest education level: Not on file  Occupational History   Not on file  Tobacco Use   Smoking status: Former    Packs/day: 25.00    Types: Cigarettes   Smokeless tobacco: Never  Vaping Use   Vaping Use: Never used  Substance and Sexual Activity   Alcohol use: Yes    Comment:  4 beers daily   Drug use: Not Currently   Sexual activity: Never  Other Topics Concern   Not on file  Social History Narrative   Not on file   Social Determinants of Health   Financial Resource Strain: Not on file  Food Insecurity: Not on file  Transportation Needs: Not on file  Physical Activity: Not on file  Stress: Not on file  Social Connections: Not on file  Intimate Partner Violence: Not on file    Family History  Problem Relation Age of Onset   Diabetes Mellitus II Daughter     Current Facility-Administered Medications  Medication Dose Route Frequency Provider Last Rate Last Admin   0.9 %  sodium chloride  infusion   Intravenous Continuous Kenneth John, MD       ceFAZolin (ANCEF) IVPB 2g/100 mL premix  2 g Intravenous 30 min Pre-Op Kenneth John, MD       Chlorhexidine Gluconate Cloth 2 % PADS 6 each  6 each Topical Once Kenneth John, MD       And   Chlorhexidine Gluconate Cloth 2 % PADS 6 each  6 each Topical Once Kenneth John, MD       lactated ringers infusion   Intravenous Continuous Roderic Palau, MD        Allergies  Allergen Reactions   Allopurinol Other (See Comments)    Makes head 'feel funny'   Atorvastatin Other (See Comments)    Unknown reaction    Motrin [Ibuprofen] Other (See Comments)    Blisters in groin area     REVIEW OF SYSTEMS:   '[X]'$  denotes positive finding, '[ ]'$  denotes negative finding Cardiac  Comments:  Chest pain or chest pressure:    Shortness of breath upon exertion:    Short of breath when lying flat:    Irregular heart rhythm:        Vascular    Pain in calf, thigh, or hip brought on by ambulation:    Pain in feet at night that wakes you up from your sleep:     Blood clot in your veins:    Leg swelling:         Pulmonary    Oxygen at home:    Productive cough:     Wheezing:         Neurologic    Sudden weakness in arms or legs:     Sudden numbness in arms or legs:     Sudden onset of difficulty speaking or slurred speech:    Temporary loss of vision in one eye:     Problems with dizziness:         Gastrointestinal    Blood in stool:     Vomited blood:         Genitourinary    Burning when urinating:     Blood in urine:        Psychiatric    Major depression:         Hematologic    Bleeding problems:    Problems with blood clotting too easily:        Skin    Rashes or ulcers:        Constitutional    Fever or chills:      PHYSICAL EXAMINATION:  Vitals:   10/09/21 0613  BP: (!) 183/102  Pulse: 75  Resp: 18  Temp: 98.1 F (36.7 C)  TempSrc: Oral  SpO2: 99%  Weight: 81.6 kg  Height: '5\' 8"'$   (1.727 m)    General:  WDWN in NAD; vital signs documented above Gait: Not observed HENT: WNL, normocephalic Pulmonary: normal non-labored breathing , without wheezing Cardiac: irregular HR Abdomen: soft, NT, no masses Skin: without rashes Vascular Exam/Pulses:  Right Left  Radial 2+ (normal) 2+ (normal)  Ulnar            DP absent absent  PT absent absent   Extremities: with ischemic changes, without Gangrene , without cellulitis; with open wounds;  Wounds on the right foot, no wounds on the left foot. Musculoskeletal: no muscle wasting or atrophy  Neurologic: A&O X 3;  No focal weakness or paresthesias are detected Psychiatric:  The pt has Normal affect.   Non-Invasive Vascular Imaging:   ABI Findings:  +---------+------------------+-----+----------+--------+  Right    Rt Pressure (mmHg)IndexWaveform  Comment   +---------+------------------+-----+----------+--------+  Brachial 143                                        +---------+------------------+-----+----------+--------+  PTA      92                0.58 monophasic          +---------+------------------+-----+----------+--------+  DP       83                0.53 monophasic          +---------+------------------+-----+----------+--------+  Great Toe                       Abnormal            +---------+------------------+-----+----------+--------+   +---------+------------------+-----+----------+-------+  Left     Lt Pressure (mmHg)IndexWaveform  Comment  +---------+------------------+-----+----------+-------+  Brachial 158                                       +---------+------------------+-----+----------+-------+  PTA      83                0.53 monophasic         +---------+------------------+-----+----------+-------+  DP       78                0.49 monophasic         +---------+------------------+-----+----------+-------+  Great Toe65                 0.41 Abnormal           +---------+------------------+-----+----------+-------+     ASSESSMENT/PLAN: MCLEAN MOYA is a 86 y.o. male presenting with bilateral lower extremity critical limb ischemia with right-sided tissue loss.  Recent CT angiogram demonstrated inflow disease in bilateral common iliac arteries, as well as nearly-occlusive lesions in bilateral common femoral arteries.  Runoff illustrated multilevel occlusive disease with significant atherosclerosis at the popliteal artery bilaterally.  Runoff was difficult to assess due to the amount of calcium but appeared to be posterior tibial bilaterally.  Had a long conversation with [name] and his daughter regarding the above.  At this point, I think he would be best served with bilateral lower extremity angiogram, with emphasis on the right from a left radial approach.  After discussing the risk and benefits, [name] elected to proceed.  This will be scheduled for Wednesday. The data from  this angiogram provide insight into the repair needed to establish inline flow to the foot.  Kenneth John, MD [facility] [phone]

## 2021-10-09 NOTE — Anesthesia Procedure Notes (Signed)
Procedure Name: Intubation Date/Time: 10/09/2021 7:58 AM  Performed by: Gwyndolyn Saxon, CRNAPre-anesthesia Checklist: Patient identified, Emergency Drugs available, Suction available and Patient being monitored Patient Re-evaluated:Patient Re-evaluated prior to induction Oxygen Delivery Method: Circle system utilized Preoxygenation: Pre-oxygenation with 100% oxygen Induction Type: IV induction Ventilation: Mask ventilation without difficulty and Oral airway inserted - appropriate to patient size Laryngoscope Size: Sabra Heck and 2 Grade View: Grade I Tube type: Oral Tube size: 7.5 mm Number of attempts: 1 Airway Equipment and Method: Stylet Placement Confirmation: ETT inserted through vocal cords under direct vision, positive ETCO2 and breath sounds checked- equal and bilateral Secured at: 21 cm Tube secured with: Tape Dental Injury: Teeth and Oropharynx as per pre-operative assessment

## 2021-10-09 NOTE — Anesthesia Procedure Notes (Signed)
Arterial Line Insertion Start/End10/05/2021 7:10 AM, 10/09/2021 7:25 AM Performed by: Roderic Palau, MD, Gwyndolyn Saxon, CRNA, CRNA  Patient location: Pre-op. Preanesthetic checklist: patient identified, IV checked, site marked, risks and benefits discussed, surgical consent, monitors and equipment checked, pre-op evaluation, timeout performed and anesthesia consent Lidocaine 1% used for infiltration Left, radial was placed Catheter size: 20 G Hand hygiene performed , maximum sterile barriers used  and Seldinger technique used Allen's test indicative of satisfactory collateral circulation Attempts: 2 Procedure performed using ultrasound guided technique. Ultrasound Notes:anatomy identified, needle tip was noted to be adjacent to the nerve/plexus identified and no ultrasound evidence of intravascular and/or intraneural injection Following insertion, dressing applied and Biopatch. Post procedure assessment: normal  Post procedure complications: unsuccessful attempts. Patient tolerated the procedure well with no immediate complications. Additional procedure comments: First attempt without ultrasound.

## 2021-10-09 NOTE — Transfer of Care (Signed)
Immediate Anesthesia Transfer of Care Note  Patient: [name]  Procedure(s) Performed: [name] ENDARTERECTOMY (Bilateral: Groin) INSERTION OF RETROGRADE ILIAC STENT (Bilateral: Groin) ULTRASOUND GUIDANCE FOR VASCULAR ACCESS (Bilateral: Groin) AORTOGRAM (Abdomen) APPLICATION OF WOUND VAC (Bilateral: Groin)  Patient Location: PACU  Anesthesia Type:General  Level of Consciousness: drowsy and patient cooperative  Airway & Oxygen Therapy: Patient Spontanous Breathing  Post-op Assessment: Report given to RN and Post -op Vital signs reviewed and stable  Post vital signs: Reviewed and stable  Last Vitals:  Vitals Value Taken Time  BP    Temp    Pulse    Resp    SpO2      Last Pain:  Vitals:   10/09/21 0643  TempSrc:   PainSc: 2          Complications: No notable events documented.

## 2021-10-09 NOTE — Progress Notes (Signed)
  Day of Surgery Note    Subjective:  resting comfortably in recovery   Vitals:   10/09/21 0613  BP: (!) 183/102  Pulse: 75  Resp: 18  Temp: 98.1 F (36.7 C)  SpO2: 99%    Incisions:   bilateral groins with Prevena vacs in place with good seal Extremities:  + right DP/PT and left AT/pero doppler signals Lungs:  non labored   Assessment/Plan:  This is a 86 y.o. male who is s/p  Bilateral iliofemoral endarterectomies  -pt with + doppler signals bilateral feet -Prevena vacs with good seal -somewhat hypertensive in recovery-he is on Cleviprex.  Will give prn Hydralazine.  Pt on BB, hydralazine, ARB, diuretic prior to admit-may need to start these back this afternoon. -continue to monitor   Leontine Locket, PA-C 10/09/2021 12:29 PM [phone]

## 2021-10-09 NOTE — Op Note (Signed)
NAME: [name]    MRN: [id number] DOB: 04/29/1935    DATE OF OPERATION: 10/09/2021  PREOP DIAGNOSIS:    Bilateral lower extremity critical limb ischemia, right foot tissue loss  POSTOP DIAGNOSIS:    Same  PROCEDURE:    Bilateral lower extremity iliofemoral endarterectomies with bovine patch plasty Bilateral common iliac artery stenting 9 x 29 mm gore VBX stents  SURGEON: [name]  ASSIST: Marjean Donna, MD  ANESTHESIA: General  EBL: 400 mL  INDICATIONS:    [name] is a 86 y.o. male with bilateral lower extremity critical limb ischemia, tissue loss on the right foot, currently being managed by podiatry.  Recent angiography demonstrated severe inflow disease which would be best treated with bilateral iliofemoral endarterectomies, retrograde stenting.  After discussing the risk and benefits of this operation both he and his daughter, he elected to proceed.     FINDINGS:   Greater than 80% stenosis of bilateral common iliac arteries Near occlusion of distal external iliac arteries, bilateral common femoral arteries.  TECHNIQUE:   Patient was brought to the OR laid in supine position.  General anesthesia was induced and the patient was prepped and draped in standard fashion.  The case began with bilateral common femoral artery exposure through vertical incisions.  Both left and right common femoral arteries were completely calcified, exposures were extended proximally to include the distal common iliac artery.  There was an area that was clampable appreciated in the external iliac artery.  Distally, both profunda and SFAs bilaterally demonstrated severe calcific disease.  Once exposed, we moved to the endovascular portion of the case.  5-0 purse-string suture was placed in the distal external iliac artery bilaterally the artery was accessed under direct supervision with a micropuncture needle, and followed by a micropuncture sheath using Seldinger technique.  Wires  were then driven into the descending aorta bilaterally.  There was significant difficulty navigating the right common iliac artery due to known severe stenosis.  The micropuncture sheaths were then exchanged for 8 French sheaths.  The 8 French sheath tracked into the terminal aorta on the patient's left side. On the right side, the sheath would not track through the common iliac artery.  A 4 mm balloon, then a 5 mm balloons were used in an effort to provide luminal gain to advance the sheath.  Both of these were unsuccessful.  At this point, I elected to heparinize using 8000 units IV heparin to an ACT greater than 250 for the remainder of the case.  Using preoperative imaging, 9 x 29 mm VBX stents were brought onto the field and loaded.  These were positioned in the common femoral arteries bilaterally.  An aortogram under breath-hold followed.  The bifurcation was marked and stents deployed.  Follow-up angiography demonstrated excellent result with resolution of flow-limiting stenosis in bilateral common iliac arteries.  Wires and sheaths were removed and purse-string sutures tightened.  Next, my attention turned to bilateral iliofemoral endarterectomies.  I started with the right side, due to the significant calcific disease.  The artery was controlled with vessel clamps and a long arteriotomy was made extending from the distal external iliac artery into the superficial femoral artery.  The length was over 10 cm.  There was severe atherosclerotic disease requiring extensive iliofemoral endarterectomy.  Special care was taken to ensure the SFA and profunda remained patent.  Next, a long, bovine pericardial patch was brought onto the field and sewn in using 5-0 Prolene suture  in running fashion.  Once completed, clamps were removed.  There were a few areas requiring repair sutures.  There was an excellent Doppler signal both proximally and distally in both the SFA and profunda.  Next, my attention turned to the  left groin, iliofemoral endarterectomy followed in similar fashion to the right.  Special care was taken to ensure the ostia of the SFA was endarterectomized.  Even though there is known distal SFA occlusion, this was done for possible endovascular salvage at a later date.  Another long bovine pericardial patch was used and sewn in using 5-0 Prolene suture in running fashion.  A Doppler ultrasound was once again utilized to ensure appropriate distal signals in the profunda.  There were signals in the peroneal arteries bilaterally at the level of the ankle.  At this point, I elected to reverse heparin with protamine.  Hemostasis was achieved with use of cautery, thrombin product, suture.  Wounds were irrigated and closed using multiple layers of 2-0 Vicryl suture with 4-0 Monocryl and Prevena vacs bilaterally.  Impression: Successful stenting of the bilateral common iliac arteries with resolution of flow-limiting stenosis.  Bilateral iliofemoral endarterectomies with bovine pericardial patch plasty.  Macie Burows, MD [facility] DATE OF DICTATION:   10/09/2021

## 2021-10-10 ENCOUNTER — Encounter (HOSPITAL_COMMUNITY): Payer: Self-pay | Admitting: Vascular Surgery

## 2021-10-10 DIAGNOSIS — L97412 Non-pressure chronic ulcer of right heel and midfoot with fat layer exposed: Secondary | ICD-10-CM

## 2021-10-10 LAB — BASIC METABOLIC PANEL
Anion gap: 11 (ref 5–15)
BUN: 17 mg/dL (ref 8–23)
CO2: 20 mmol/L — ABNORMAL LOW (ref 22–32)
Calcium: 9.1 mg/dL (ref 8.9–10.3)
Chloride: 100 mmol/L (ref 98–111)
Creatinine, Ser: 0.66 mg/dL (ref 0.61–1.24)
GFR, Estimated: >60 mL/min (ref >60)
Glucose, Bld: 149 mg/dL — ABNORMAL HIGH (ref 70–99)
Potassium: 4.2 mmol/L (ref 3.5–5.1)
Sodium: 131 mmol/L — ABNORMAL LOW (ref 135–145)

## 2021-10-10 LAB — TYPE AND SCREEN
ABO/RH(D): A POS
Antibody Screen: NEGATIVE
Unit division: 0
Unit division: 0
Unit division: 0
Unit division: 0

## 2021-10-10 LAB — BPAM RBC
Blood Product Expiration Date: 202310262359
Blood Product Expiration Date: 202310262359
Blood Product Expiration Date: 202310262359
Blood Product Expiration Date: 202310262359
ISSUE DATE / TIME: 202310051039
ISSUE DATE / TIME: 202310051039
Unit Type and Rh: 6200
Unit Type and Rh: 6200
Unit Type and Rh: 6200
Unit Type and Rh: 6200

## 2021-10-10 LAB — CBC
HCT: 35.9 % — ABNORMAL LOW (ref 39.0–52.0)
Hemoglobin: 12.8 g/dL — ABNORMAL LOW (ref 13.0–17.0)
MCH: 31.8 pg (ref 26.0–34.0)
MCHC: 35.7 g/dL (ref 30.0–36.0)
MCV: 89.1 fL (ref 80.0–100.0)
Platelets: 177 10*3/uL (ref 150–400)
RBC: 4.03 MIL/uL — ABNORMAL LOW (ref 4.22–5.81)
RDW: 14.7 % (ref 11.5–15.5)
WBC: 8 10*3/uL (ref 4.0–10.5)
nRBC: 0 % (ref 0.0–0.2)

## 2021-10-10 LAB — GLUCOSE, CAPILLARY
Glucose-Capillary: 117 mg/dL — ABNORMAL HIGH (ref 70–99)
Glucose-Capillary: 139 mg/dL — ABNORMAL HIGH (ref 70–99)
Glucose-Capillary: 117 mg/dL — ABNORMAL HIGH (ref 70–99)
Glucose-Capillary: 167 mg/dL — ABNORMAL HIGH (ref 70–99)

## 2021-10-10 LAB — LIPID PANEL
Cholesterol: 122 mg/dL (ref 0–200)
HDL: 52 mg/dL (ref >40)
LDL Cholesterol: 60 mg/dL (ref 0–99)
Total CHOL/HDL Ratio: 2.3 RATIO
Triglycerides: 49 mg/dL (ref <150)
VLDL: 10 mg/dL (ref 0–40)

## 2021-10-10 MED ORDER — SPIRITUS FRUMENTI: 1.0000 | Freq: Four times a day (QID) | ORAL | Status: DC

## 2021-10-10 MED ORDER — MUPIROCIN 2 % EX OINT
1.0000 | TOPICAL_OINTMENT | Freq: Two times a day (BID) | CUTANEOUS | Status: DC
  Administered 2021-10-10 – 2021-10-12 (×5): 1 via NASAL
  Filled 2021-10-10 (×2): qty 22

## 2021-10-10 MED ORDER — CHLORHEXIDINE GLUCONATE CLOTH 2 % EX PADS
6.0000 | MEDICATED_PAD | Freq: Every day | CUTANEOUS | Status: DC
  Administered 2021-10-12: 6 via TOPICAL

## 2021-10-10 MED ORDER — SPIRITUS FRUMENTI
1.0000 | Freq: Four times a day (QID) | ORAL | Status: DC
  Administered 2021-10-10 – 2021-10-12 (×5): 1 via ORAL
  Filled 2021-10-10 (×10): qty 1

## 2021-10-10 NOTE — Consult Note (Addendum)
WOC Nurse Consult Note: Patient receiving care in [place]. Reason for Consult: care recommendations for right heel wound Wound type: patient tells me that someone from podiatry is coming to see him today and provide care to the right heel.  He further tells me it has hydrofera blue on the wound (we don't have in our formulary), and dry dressing wrapped in kerlix. I did not unwrap the foot.  I did reach out to the podiatrist in his record, Dr. Celesta Gentile, and asked if someone from podiatry is in fact coming to see him today.  I am awaiting his response. Val Riles, RN, MSN, CWOCN, CNS-BC, pager [phone]   (9:36) Addendum:  Dr. Jacqualyn Posey was not aware the patient was in hospital. Dr. Lanae Crumbly will be seeing the patient later today. Val Riles, RN, MSN, CWOCN, CNS-BC, pager [phone]

## 2021-10-10 NOTE — Progress Notes (Signed)
Mobility Specialist Progress Note:   10/10/21 1409  Mobility  Activity Ambulated with assistance in room  Activity Response Tolerated fair  Distance Ambulated (ft) 16 ft  $Mobility charge 1 Mobility  Level of Assistance Standby assist, set-up cues, supervision of patient - no hands on  Assistive Device Four wheel walker   Pt received in chair willing to participate in mobility. Complaints of 9/10 RLE pain. Left in bed with call bell in reach and all needs met.   [facility] Surveyor, mining Chat only

## 2021-10-10 NOTE — Progress Notes (Addendum)
[facility] [place]  Subjective  - no new complaints   Objective 118/60 76 97.9 F (36.6 C) (Oral) 17 97%  Intake/Output Summary (Last 24 hours) at 10/10/2021 0729 Last data filed at 10/10/2021 2202 Gross per 24 hour  Intake 2735 ml  Output 2085 ml  Net 650 ml    B peroneal doppler signals right better than left Dry dressing right foot for tissue loss  Lungs non labored breathing Tolerating breakfast  Assessment/Planning: POD # 1 Bilateral lower extremity iliofemoral endarterectomies with bovine patch plasty Bilateral common iliac artery stenting 9 x 29 mm gore VBX stents Continue Plavix, ASA, and Crestor  PT/OT eval and treat for mobility Will discuss weight bearing status with Dr. Virl Cagey due to heel wound. The foot ulcer has been followed by DPM Dr. Jacqualyn Posey.    Roxy Horseman 10/10/2021 7:29 AM --  VASCULAR STAFF ADDENDUM: I have independently interviewed and examined the patient. I agree with the above.  Doing well, signals distally, groin sites soft, VACs to suction Weight bearing as tolerated.  Dressing changes to ankle daily.  Daily labs.  PT  Cassandria Santee, MD [facility] Phone Number: [phone] 10/10/2021 1:59 PM    Laboratory Lab Results: Recent Labs    10/09/21 0658 10/09/21 1019 10/10/21 0054  WBC 4.4  --  8.0  HGB 13.7 12.2* 12.8*  HCT 40.6 36.0* 35.9*  PLT 187  --  177   BMET Recent Labs    10/09/21 0658 10/09/21 1019 10/10/21 0054  NA 133* 132* 131*  K 5.2* 4.3 4.2  CL 100  --  100  CO2 23  --  20*  GLUCOSE 114*  --  149*  BUN 21  --  17  CREATININE 0.66  --  0.66  CALCIUM 10.2  --  9.1    COAG Lab Results  Component Value Date   INR 1.1 10/09/2021   INR 1.95 (H) 07/22/2014   INR 1.27 05/20/2014   No results found for: "PTT"

## 2021-10-10 NOTE — Evaluation (Signed)
Physical Therapy Evaluation Patient Details Name: [name] MRN: [id number] DOB: February 13, 1935 Today's Date: 10/10/2021  History of Present Illness  86 year old male with BLE critical limb ischemia s/p bilat iliofemoral endarterectomies with bovine patch plasty 10/5. PMH: R foot wounds, DM2, chronic alcoholism, HTN, gout, small CVA, R hip sepsis with R psoas abscess   Clinical Impression  Pt admitted with above diagnosis. PTA pt lived at home with family, mod I mobility/ADLs using rollator.  Pt currently with functional limitations due to the deficits listed below (see PT Problem List). On eval, pt required min assist bed mobility, min assist transfers, and min assist ambulation 25' with rollator. Increased time required to complete all mobility skills. Amb distance limited by pain. Pt will benefit from skilled PT to increase their independence and safety with mobility to allow discharge to the venue listed below.          Recommendations for follow up therapy are one component of a multi-disciplinary discharge planning process, led by the attending physician.  Recommendations may be updated based on patient status, additional functional criteria and insurance authorization.  Follow Up Recommendations Home health PT      Assistance Recommended at Discharge Frequent or constant Supervision/Assistance  Patient can return home with the following  A little help with walking and/or transfers;Assistance with cooking/housework;Assist for transportation;A little help with bathing/dressing/bathroom    Equipment Recommendations None recommended by PT  Recommendations for Other Services       Functional Status Assessment Patient has had a recent decline in their functional status and demonstrates the ability to make significant improvements in function in a reasonable and predictable amount of time.     Precautions / Restrictions Precautions Precautions: Fall;Other (comment) Precaution Comments:  wound vac bilat anterior hips (one vac, two tubes/sites) Restrictions Weight Bearing Restrictions: No Other Position/Activity Restrictions: For amb, wears sock/sneaker on L foot and croc flip flop on R foot      Mobility  Bed Mobility Overal bed mobility: Needs Assistance Bed Mobility: Supine to Sit     Supine to sit: Min assist, HOB elevated     General bed mobility comments: +rail, increased time to maximize pt's independence    Transfers Overall transfer level: Needs assistance Equipment used: Rollator (4 wheels) Transfers: Sit to/from Stand Sit to Stand: Min assist           General transfer comment: increased time, assist to power up    Ambulation/Gait Ambulation/Gait assistance: Min assist Gait Distance (Feet): 25 Feet Assistive device: Rollator (4 wheels) Gait Pattern/deviations: Step-through pattern, Decreased stride length, Steppage, Trunk flexed Gait velocity: decreased Gait velocity interpretation: <1.8 ft/sec, indicate of risk for recurrent falls   General Gait Details: slow, guarded gait. Frequent use of brakes on rollator for increased stability between steps. Distance limited by pain.  Stairs            Wheelchair Mobility    Modified Rankin (Stroke Patients Only)       Balance Overall balance assessment: Needs assistance Sitting-balance support: Feet supported, No upper extremity supported Sitting balance-Leahy Scale: Fair     Standing balance support: Bilateral upper extremity supported, During functional activity, Reliant on assistive device for balance Standing balance-Leahy Scale: Poor                               Pertinent Vitals/Pain Pain Assessment Pain Assessment: Faces Faces Pain Scale: Hurts even more Pain Location: lower abdomen/vac  sites Pain Descriptors / Indicators: Grimacing, Operative site guarding, Discomfort, Moaning Pain Intervention(s): Limited activity within patient's tolerance, Monitored  during session, Repositioned    Home Living Family/patient expects to be discharged to:: Private residence Living Arrangements: Children Available Help at Discharge: Family;Available 24 hours/day Type of Home: House Home Access: Elevator       Home Layout: Multi-level Home Equipment: Rollator (4 wheels);Cane - single point;Shower seat - built in;Grab bars - tub/shower;Hand held shower head;Wheelchair - manual;Other (comment) (adjustable frame bed with rails) Additional Comments: lives with daughter on his own floor    Prior Function Prior Level of Function : Independent/Modified Independent             Mobility Comments: amb household distances with rollator. Rides electric scooter in grocery store. Family drives pt to store and doctor appts.       Hand Dominance   Dominant Hand: Right    Extremity/Trunk Assessment   Upper Extremity Assessment Upper Extremity Assessment: Overall WFL for tasks assessed    Lower Extremity Assessment Lower Extremity Assessment: Generalized weakness;RLE deficits/detail;LLE deficits/detail RLE Deficits / Details: chronic wound R heel with dressing in place RLE: Unable to fully assess due to pain LLE: Unable to fully assess due to pain    Cervical / Trunk Assessment Cervical / Trunk Assessment: Kyphotic  Communication   Communication: No difficulties  Cognition Arousal/Alertness: Awake/alert Behavior During Therapy: WFL for tasks assessed/performed Overall Cognitive Status: Within Functional Limits for tasks assessed                                          General Comments General comments (skin integrity, edema, etc.): VSS on RA    Exercises     Assessment/Plan    PT Assessment Patient needs continued PT services  PT Problem List Decreased strength;Decreased mobility;Pain;Decreased balance;Decreased activity tolerance;Decreased skin integrity       PT Treatment Interventions Therapeutic activities;Gait  training;Therapeutic exercise;Patient/family education;Balance training;Functional mobility training    PT Goals (Current goals can be found in the Care Plan section)  Acute Rehab PT Goals Patient Stated Goal: home PT Goal Formulation: With patient Time For Goal Achievement: 10/24/21 Potential to Achieve Goals: Good    Frequency Min 3X/week     Co-evaluation               AM-PAC PT "6 Clicks" Mobility  Outcome Measure Help needed turning from your back to your side while in a flat bed without using bedrails?: A Little Help needed moving from lying on your back to sitting on the side of a flat bed without using bedrails?: A Lot Help needed moving to and from a bed to a chair (including a wheelchair)?: A Little Help needed standing up from a chair using your arms (e.g., wheelchair or bedside chair)?: A Little Help needed to walk in hospital room?: A Little Help needed climbing 3-5 steps with a railing? : A Lot 6 Click Score: 16    End of Session Equipment Utilized During Treatment: Gait belt Activity Tolerance: Patient tolerated treatment well Patient left: in chair;with call bell/phone within reach;Other (comment) (charm alarm pad in place) Nurse Communication: Mobility status;Other (comment) (alarm pad in place but not on) PT Visit Diagnosis: Other abnormalities of gait and mobility (R26.89);Pain;Muscle weakness (generalized) (M62.81)    Time: 7062-3762 PT Time Calculation (min) (ACUTE ONLY): 28 min   Charges:   PT  Evaluation $PT Eval Moderate Complexity: 1 Mod PT Treatments $Gait Training: 8-22 mins        Lorrin Goodell, [place]  Office # [phone] Pager [phone]   Lorriane Shire 10/10/2021, 12:18 PM

## 2021-10-10 NOTE — Consult Note (Signed)
Reason for Consult:ulcer of right foot  [name] is an 86 y.o. male.  HPI: Patient requested consult from our service for ongoing R heel wound, he underwent vascular surgery yesterday. Doing well. Says the heel feels better.  Past Medical History:  Diagnosis Date   Acute encephalopathy 05/09/2014   Alcoholism ([place])    Arthritis    Cerebral infarction due to embolism of left middle cerebral artery ([place])    Diabetes mellitus type 2, controlled ([place]) 05/04/2014   Essential hypertension 05/04/2014   Gout 05/04/2014   Hypertension    New onset atrial fibrillation ([place]) 05/04/2014   Psoas abscess ([place]) 05/04/2014   Psoas abscess, right ([place])    SDH (subdural hematoma) ([place]) 05/12/2014   Sepsis ([place]) 05/04/2014   Septic arthritis of hip ([place]) 05/18/2014   Stenosis of right carotid artery    40-59 percent    Streptococcal bacteremia 05/09/2014    Past Surgical History:  Procedure Laterality Date   ABDOMINAL AORTOGRAM W/LOWER EXTREMITY N/A 09/24/2021   Procedure: ABDOMINAL AORTOGRAM W/LOWER EXTREMITY;  Surgeon: [name], MD;  Location: [facility];  Service: Cardiovascular;  Laterality: N/A;   AORTOGRAM  10/09/2021   Procedure: AORTOGRAM;  Surgeon: [name], MD;  Location: [facility];  Service: Vascular;;   APPLICATION OF WOUND VAC Bilateral 10/09/2021   Procedure: APPLICATION OF WOUND VAC;  Surgeon: [name], MD;  Location: [facility];  Service: Vascular;  Laterality: Bilateral;   BONE BIOPSY Right 08/13/2021   Procedure: BONE BIOPSY;  Surgeon: Trula Slade, DPM;  Location: [facility];  Service: Podiatry;  Laterality: Right;   ENDARTERECTOMY Bilateral 10/09/2021   Procedure: ILIOFEMORAL ENDARTERECTOMY;  Surgeon: [name], MD;  Location: [facility];  Service: Vascular;  Laterality: Bilateral;   HIP ARTHROPLASTY Right    INSERTION OF ILIAC STENT Bilateral 10/09/2021   Procedure: INSERTION OF RETROGRADE ILIAC STENT;  Surgeon: [name], MD;  Location: [facility];   Service: Vascular;  Laterality: Bilateral;   JOINT REPLACEMENT     right hip, bilateral knees   left femur surgery Left    with rod   right cataract extraction     TONSILLECTOMY     TOTAL SHOULDER ARTHROPLASTY Bilateral    ULTRASOUND GUIDANCE FOR VASCULAR ACCESS Bilateral 10/09/2021   Procedure: ULTRASOUND GUIDANCE FOR VASCULAR ACCESS;  Surgeon: [name], MD;  Location: [facility];  Service: Vascular;  Laterality: Bilateral;   WOUND DEBRIDEMENT Right 08/13/2021   Procedure: DEBRIDEMENT WOUND;  Surgeon: Trula Slade, DPM;  Location[facility];  Service: Podiatry;  Laterality: Right;    Family History  Problem Relation Age of Onset   Diabetes Mellitus II Daughter     Social History:  reports that he has quit smoking. His smoking use included cigarettes. He smoked an average of 25 packs per day. He has never used smokeless tobacco. He reports current alcohol use. He reports that he does not currently use drugs.  Allergies:  Allergies  Allergen Reactions   Allopurinol Other (See Comments)    Makes head 'feel funny'   Atorvastatin Other (See Comments)    Unknown reaction    Motrin [Ibuprofen] Other (See Comments)    Blisters in groin area    Medications: I have reviewed the patient's current medications.  Results for orders placed or performed during the hospital encounter of 10/09/21 (from the past 48 hour(s))  Surgical pcr screen     Status: Abnormal   Collection Time:  10/09/21  6:20 AM   Specimen: Nasal Mucosa; Nasal Swab  Result Value Ref Range   MRSA, PCR POSITIVE (A) NEGATIVE    Comment: RESULT CALLED TO, READ BACK BY AND VERIFIED WITH: RN Edison Pace 10/09/21 '@0832'$  BY AB    Staphylococcus aureus POSITIVE (A) NEGATIVE    Comment: (NOTE) The Xpert SA Assay (FDA approved for NASAL specimens in patients 53 years of age and older), is one component of a comprehensive surveillance program. It is not intended to diagnose infection nor to guide or monitor treatment. Performed at  [facility], [place] [address], [place], [place] [address]   Glucose, capillary     Status: Abnormal   Collection Time: 10/09/21  6:20 AM  Result Value Ref Range   Glucose-Capillary 129 (H) 70 - 99 mg/dL    Comment: Glucose reference range applies only to samples taken after fasting for at least 8 hours.  Type and screen     Status: None   Collection Time: 10/09/21  6:40 AM  Result Value Ref Range   ABO/RH(D) A POS    Antibody Screen NEG    Sample Expiration 10/12/2021,2359    Unit Number [id number]    Blood Component Type RBC LR PHER1    Unit division 00    Status of Unit REL FROM [facility]    Transfusion Status OK TO TRANSFUSE    Crossmatch Result      Compatible Performed at [facility], [place] [address] [place], [place] [address]    Unit Number [id number]    Blood Component Type RED CELLS,LR    Unit division 00    Status of Unit ISSUED,FINAL    Transfusion Status OK TO TRANSFUSE    Crossmatch Result Compatible    Unit Number [id number]    Blood Component Type RED CELLS,LR    Unit division 00    Status of Unit REL FROM [facility]    Transfusion Status OK TO TRANSFUSE    Crossmatch Result Compatible    Unit Number [id number]    Blood Component Type RED CELLS,LR    Unit division 00    Status of Unit REL FROM [facility]    Transfusion Status OK TO TRANSFUSE    Crossmatch Result Compatible   CBC     Status: None   Collection Time: 10/09/21  6:58 AM  Result Value Ref Range   WBC 4.4 4.0 - 10.5 K/uL   RBC 4.34 4.22 - 5.81 MIL/uL   Hemoglobin 13.7 13.0 - 17.0 g/dL   HCT 40.6 39.0 - 52.0 %   MCV 93.5 80.0 - 100.0 fL   MCH 31.6 26.0 - 34.0 pg   MCHC 33.7 30.0 - 36.0 g/dL   RDW 14.4 11.5 - 15.5 %   Platelets 187 150 - 400 K/uL   nRBC 0.0 0.0 - 0.2 %    Comment: Performed at [facility], [place] [address] [place], [place] [address]  Comprehensive metabolic panel     Status: Abnormal   Collection Time: 10/09/21  6:58 AM  Result Value Ref Range   Sodium  133 (L) 135 - 145 mmol/L   Potassium 5.2 (H) 3.5 - 5.1 mmol/L   Chloride 100 98 - 111 mmol/L   CO2 23 22 - 32 mmol/L   Glucose, Bld 114 (H) 70 - 99 mg/dL    Comment: Glucose reference range applies only to samples taken after fasting for at least 8 hours.   BUN 21 8 - 23  mg/dL   Creatinine, Ser 0.66 0.61 - 1.24 mg/dL   Calcium 10.2 8.9 - 10.3 mg/dL   Total Protein 6.6 6.5 - 8.1 g/dL   Albumin 4.1 3.5 - 5.0 g/dL   AST 29 15 - 41 U/L   ALT 22 0 - 44 U/L   Alkaline Phosphatase 52 38 - 126 U/L   Total Bilirubin 0.9 0.3 - 1.2 mg/dL   GFR, Estimated >60 >60 mL/min    Comment: (NOTE) Calculated using the CKD-EPI Creatinine Equation (2021)    Anion gap 10 5 - 15    Comment: Performed at [facility] [address] [place], [place] [address]  Protime-INR     Status: None   Collection Time: 10/09/21  6:58 AM  Result Value Ref Range   Prothrombin Time 14.4 11.4 - 15.2 seconds   INR 1.1 0.8 - 1.2    Comment: (NOTE) INR goal varies based on device and disease states. Performed at [facility], [place] [address], [place], [place] [address]   APTT     Status: None   Collection Time: 10/09/21  6:58 AM  Result Value Ref Range   aPTT 29 24 - 36 seconds    Comment: Performed at [facility] [address], [place], [place] [address]  POCT Activated clotting time     Status: None   Collection Time: 10/09/21  9:07 AM  Result Value Ref Range   Activated Clotting Time 263 seconds    Comment: Reference range 74-137 seconds for patients not on anticoagulant therapy.  POCT Activated clotting time     Status: None   Collection Time: 10/09/21  9:35 AM  Result Value Ref Range   Activated Clotting Time 233 seconds    Comment: Reference range 74-137 seconds for patients not on anticoagulant therapy.  POCT Activated clotting time     Status: None   Collection Time: 10/09/21 10:10 AM  Result Value Ref Range   Activated Clotting Time 239 seconds    Comment: Reference range 74-137  seconds for patients not on anticoagulant therapy.  I-STAT 7, (LYTES, BLD GAS, ICA, H+H)     Status: Abnormal   Collection Time: 10/09/21 10:19 AM  Result Value Ref Range   pH, Arterial 7.391 7.35 - 7.45   pCO2 arterial 37.4 32 - 48 mmHg   pO2, Arterial 230 (H) 83 - 108 mmHg   Bicarbonate 22.7 20.0 - 28.0 mmol/L   TCO2 24 22 - 32 mmol/L   O2 Saturation 100 %   Acid-base deficit 2.0 0.0 - 2.0 mmol/L   Sodium 132 (L) 135 - 145 mmol/L   Potassium 4.3 3.5 - 5.1 mmol/L   Calcium, Ion 1.31 1.15 - 1.40 mmol/L   HCT 36.0 (L) 39.0 - 52.0 %   Hemoglobin 12.2 (L) 13.0 - 17.0 g/dL   Sample type ARTERIAL   Prepare RBC (crossmatch)     Status: None   Collection Time: 10/09/21 10:30 AM  Result Value Ref Range   Order Confirmation      ORDER PROCESSED BY BLOOD BANK Performed at [facility], [address] [place], [place] [address]   POCT Activated clotting time     Status: None   Collection Time: 10/09/21 10:45 AM  Result Value Ref Range   Activated Clotting Time 251 seconds    Comment: Reference range 74-137 seconds for patients not on anticoagulant therapy.  Glucose, capillary     Status: Abnormal   Collection  Time: 10/09/21 12:17 PM  Result Value Ref Range   Glucose-Capillary 144 (H) 70 - 99 mg/dL    Comment: Glucose reference range applies only to samples taken after fasting for at least 8 hours.  Glucose, capillary     Status: Abnormal   Collection Time: 10/09/21  4:12 PM  Result Value Ref Range   Glucose-Capillary 143 (H) 70 - 99 mg/dL    Comment: Glucose reference range applies only to samples taken after fasting for at least 8 hours.  Glucose, capillary     Status: Abnormal   Collection Time: 10/09/21  9:00 PM  Result Value Ref Range   Glucose-Capillary 177 (H) 70 - 99 mg/dL    Comment: Glucose reference range applies only to samples taken after fasting for at least 8 hours.   Comment 1 Notify RN    Comment 2 Document in Chart   CBC     Status: Abnormal   Collection  Time: 10/10/21 12:54 AM  Result Value Ref Range   WBC 8.0 4.0 - 10.5 K/uL   RBC 4.03 (L) 4.22 - 5.81 MIL/uL   Hemoglobin 12.8 (L) 13.0 - 17.0 g/dL   HCT 35.9 (L) 39.0 - 52.0 %   MCV 89.1 80.0 - 100.0 fL   MCH 31.8 26.0 - 34.0 pg   MCHC 35.7 30.0 - 36.0 g/dL   RDW 14.7 11.5 - 15.5 %   Platelets 177 150 - 400 K/uL   nRBC 0.0 0.0 - 0.2 %    Comment: Performed at [facility], [place] [address], [place], [place] [address]  Basic metabolic panel     Status: Abnormal   Collection Time: 10/10/21 12:54 AM  Result Value Ref Range   Sodium 131 (L) 135 - 145 mmol/L   Potassium 4.2 3.5 - 5.1 mmol/L   Chloride 100 98 - 111 mmol/L   CO2 20 (L) 22 - 32 mmol/L   Glucose, Bld 149 (H) 70 - 99 mg/dL    Comment: Glucose reference range applies only to samples taken after fasting for at least 8 hours.   BUN 17 8 - 23 mg/dL   Creatinine, Ser 0.66 0.61 - 1.24 mg/dL   Calcium 9.1 8.9 - 10.3 mg/dL   GFR, Estimated >60 >60 mL/min    Comment: (NOTE) Calculated using the CKD-EPI Creatinine Equation (2021)    Anion gap 11 5 - 15    Comment: Performed at [facility] [address], [place], [place] [address]  Lipid panel     Status: None   Collection Time: 10/10/21 12:54 AM  Result Value Ref Range   Cholesterol 122 0 - 200 mg/dL   Triglycerides 49 <150 mg/dL   HDL 52 >40 mg/dL   Total CHOL/HDL Ratio 2.3 RATIO   VLDL 10 0 - 40 mg/dL   LDL Cholesterol 60 0 - 99 mg/dL    Comment:        Total Cholesterol/HDL:CHD Risk Coronary Heart Disease Risk Table                     Men   Women  1/2 Average Risk   3.4   3.3  Average Risk       5.0   4.4  2 X Average Risk   9.6   7.1  3 X Average Risk  23.4   11.0        Use the calculated Patient Ratio above and the CHD Risk Table to determine  the patient's CHD Risk.        ATP III CLASSIFICATION (LDL):  <100     mg/dL   Optimal  100-129  mg/dL   Near or Above                    Optimal  130-159  mg/dL   Borderline  160-189  mg/dL   High   >190     mg/dL   Very High Performed at [facility] [address], [place], [place] [address]   Glucose, capillary     Status: Abnormal   Collection Time: 10/10/21  6:09 AM  Result Value Ref Range   Glucose-Capillary 117 (H) 70 - 99 mg/dL    Comment: Glucose reference range applies only to samples taken after fasting for at least 8 hours.   Comment 1 Notify RN    Comment 2 Document in Chart   Glucose, capillary     Status: Abnormal   Collection Time: 10/10/21 11:57 AM  Result Value Ref Range   Glucose-Capillary 139 (H) 70 - 99 mg/dL    Comment: Glucose reference range applies only to samples taken after fasting for at least 8 hours.  Glucose, capillary     Status: Abnormal   Collection Time: 10/10/21  4:24 PM  Result Value Ref Range   Glucose-Capillary 117 (H) 70 - 99 mg/dL    Comment: Glucose reference range applies only to samples taken after fasting for at least 8 hours.    HYBRID OR IMAGING ([facility] ONLY)  Result Date: 10/09/2021 There is no interpretation for this exam.  This order is for images obtained during a surgical procedure.  Please See "Surgeries" Tab for more information regarding the procedure.    Review of Systems  Skin:        R heel ulcer  All other systems reviewed and are negative.  Blood pressure 114/70, pulse 75, temperature 98.1 F (36.7 C), temperature source Oral, resp. rate 13, height '5\' 8"'$  (1.727 m), weight 81.6 kg, SpO2 98 %.  Vitals:   10/10/21 0731 10/10/21 1618  BP:  114/70  Pulse:  75  Resp:  13  Temp: 98.3 F (36.8 C) 98.1 F (36.7 C)  SpO2:  98%    General AA&O x3. Normal mood and affect.  Vascular Foot warm and well perfused.   Neurologic Decreased sensation  Dermatologic (Wound) Stable heel ulcer with granular bed no SOI   Orthopedic: Motor intact BLE.    Assessment/Plan:  Heel ulcer R - expect should heal with vascular intervention - Medihoney dressings ordered - F/U as outpatient with the [facility] 10/10/2021, 5:54 PM   Best available via secure chat for questions or concerns.

## 2021-10-10 NOTE — Progress Notes (Signed)
PHARMACIST LIPID MONITORING   [name] is a 86 y.o. male admitted on 10/09/2021 with PVD.  Pharmacy has been consulted to optimize lipid-lowering therapy with the indication of secondary prevention for clinical ASCVD.  Recent Labs:  Lipid Panel (last 6 months):   Lab Results  Component Value Date   CHOL 122 10/10/2021   TRIG 49 10/10/2021   HDL 52 10/10/2021   CHOLHDL 2.3 10/10/2021   VLDL 10 10/10/2021   LDLCALC 60 10/10/2021    Hepatic function panel (last 6 months):   Lab Results  Component Value Date   AST 29 10/09/2021   ALT 22 10/09/2021   ALKPHOS 52 10/09/2021   BILITOT 0.9 10/09/2021    SCr (since admission):   Serum creatinine: 0.66 mg/dL 10/10/21 0054 Estimated creatinine clearance: 66.5 mL/min  Current therapy and lipid therapy tolerance Current lipid-lowering therapy: Crestor '20mg'$ /d Previous lipid-lowering therapies (if applicable): Crestor '10mg'$ /d Documented or reported allergies or intolerances to lipid-lowering therapies (if applicable): none   Plan:    1.Statin intensity (high intensity recommended for all patients regardless of the LDL):  No statin changes. The patient is already on a high intensity statin. -Crestor was increased to '20mg'$ /d on 09/29/21  2.Add ezetimibe (if any one of the following):   Not indicated at this time.  3.Refer to lipid clinic:   No  4.Follow-up with:  Primary care provider - Koirala, Dibas, MD  5.Follow-up labs after discharge:  No changes in lipid therapy, repeat a lipid panel in one year.     Hildred Laser, PharmD Clinical Pharmacist **Pharmacist phone directory can now be found on [email or web address] ([email or web address]).  Listed under [place].

## 2021-10-10 NOTE — Evaluation (Signed)
Occupational Therapy Evaluation and Discharge Patient Details Name: [name] MRN: [id number] DOB: 1935/03/25 Today's Date: 10/10/2021   History of Present Illness 86 year old male with BLE critical limb ischemia s/p bilat iliofemoral endarterectomies with bovine patch plasty 10/5. 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 Currently he is setup/S-Mod A with ADLs and min A with transfers. He will benefit from follow up [place]. Family in room interested in private pay agencies for [facility] CM aware. No further acute OT needs, we will sign off.      Recommendations for follow up therapy are one component of a multi-disciplinary discharge planning process, led by the attending physician.  Recommendations may be updated based on patient status, additional functional criteria and insurance authorization.   Follow Up Recommendations  Home health OT    Assistance Recommended at Discharge Frequent or constant Supervision/Assistance  Patient can return home with the following A little help with walking and/or transfers;A lot of help with bathing/dressing/bathroom;Assistance with cooking/housework;Assist for transportation    Functional Status Assessment  Patient has had a recent decline in their functional status and demonstrates the ability to make significant improvements in function in a reasonable and predictable amount of time.  Equipment Recommendations  None recommended by OT       Precautions / Restrictions Precautions Precautions: Fall Precaution Comments: wound vac bilat anterior hips (one vac, two tubes/sites) Restrictions Weight Bearing Restrictions: No Other Position/Activity Restrictions: For amb, wears sock/sneaker on L foot and croc flip flop on R foot because shoe won't fit it       Mobility Bed Mobility Overal bed mobility: Needs Assistance Bed Mobility: Supine to Sit, Sit to Supine     Supine to sit: Supervision, HOB elevated Sit to supine: Supervision, HOB elevated   General bed mobility comments: increased time    Transfers Overall transfer level: Needs assistance Equipment used: Rollator (4 wheels) Transfers: Sit to/from Stand Sit to Stand: Min assist           General transfer comment: increased time, assist to power up      Balance Overall balance assessment: Needs assistance Sitting-balance support: No upper extremity supported, Feet supported Sitting balance-Leahy Scale: Good     Standing balance support: Bilateral upper extremity supported, During functional activity, Reliant on assistive device for balance Standing balance-Leahy Scale: Poor                             ADL either performed or assessed with clinical judgement   ADL Overall ADL's : Needs assistance/impaired Eating/Feeding: Independent;Sitting   Grooming: Min guard;Standing   Upper Body Bathing: Set up;Supervision/ safety;Sitting   Lower Body Bathing: Minimal assistance Lower Body Bathing Details (indicate cue type and reason): min A sit<>stand from bed Upper Body Dressing : Set up;Sitting   Lower Body Dressing: Moderate assistance Lower Body Dressing Details (indicate cue type and reason): without AE,min A sit<>stand from bed Toilet Transfer: Ambulation;Rollator (4 wheels);Minimal assistance Toilet Transfer Details (indicate cue type and reason): simulated bed>hallway walking>back to bed Toileting- Clothing Manipulation and Hygiene: Sit to/from stand;Minimal assistance               Vision Patient Visual Report: No change from baseline  Pertinent Vitals/Pain Pain Assessment Pain Assessment: Faces Faces Pain Scale: Hurts even more Pain Location: vac sites Pain Descriptors / Indicators: Grimacing, Operative site  guarding Pain Intervention(s): Limited activity within patient's tolerance, Monitored during session, Repositioned     Hand Dominance Right   Extremity/Trunk Assessment Upper Extremity Assessment Upper Extremity Assessment: Overall WFL for tasks assessed           Communication Communication Communication: No difficulties   Cognition Arousal/Alertness: Awake/alert Behavior During Therapy: WFL for tasks assessed/performed Overall Cognitive Status: Within Functional Limits for tasks assessed                                                  Home Living Family/patient expects to be discharged to:: Private residence Living Arrangements: Children Available Help at Discharge: Family;Available 24 hours/day Type of Home: House Home Access: Elevator     Home Layout: Multi-level Alternate Level Stairs-Number of Steps: elevator   Bathroom Shower/Tub: Occupational psychologist: Handicapped height     Home Equipment: Rollator (4 wheels);Cane - single point;Shower seat - built in;Grab bars - tub/shower;Hand held shower head;Wheelchair - manual;Other (comment);Grab bars - toilet;Adaptive equipment Adaptive Equipment: Reacher;Sock aid;Long-handled shoe horn Additional Comments: lives with daughter on his own floor      Prior Functioning/Environment Prior Level of Function : Independent/Modified Independent             Mobility Comments: amb household distances with rollator. Rides electric scooter in grocery store. Family drives pt to store and doctor appts. ADLs Comments: Pt is Mod I with basic ADLs using AE prn        OT Problem List: Decreased range of motion;Impaired balance (sitting and/or standing);Pain         OT Goals(Current goals can be found in the care plan section) Acute Rehab OT Goals Patient Stated Goal: to get a beer         AM-PAC OT "6 Clicks" Daily Activity     Outcome Measure Help from another person eating meals?:  None Help from another person taking care of personal grooming?: A Little Help from another person toileting, which includes using toliet, bedpan, or urinal?: A Little Help from another person bathing (including washing, rinsing, drying)?: A Lot Help from another person to put on and taking off regular upper body clothing?: A Little Help from another person to put on and taking off regular lower body clothing?: A Lot 6 Click Score: 17   End of Session Equipment Utilized During Treatment: Rolling walker (2 wheels);Gait belt  Activity Tolerance: Patient tolerated treatment well Patient left: in bed;with call bell/phone within reach;with bed alarm set;with family/visitor present  OT Visit Diagnosis: Unsteadiness on feet (R26.81);Other abnormalities of gait and mobility (R26.89);Pain Pain - Right/Left:  (both) Pain - part of body:  (wound vac areas at Bil groin)                Time: 0947-0962 OT Time Calculation (min): 37 min Charges:  OT General Charges $OT Visit: 1 Visit OT Evaluation $OT Eval Moderate Complexity: 1 Mod OT Treatments $Self Care/Home Management : 23-37 mins  Golden Circle, OTR/L Acute Rehab Services [facility] [phone] Office [phone]    Almon Register 10/10/2021, 4:14 PM

## 2021-10-11 LAB — GLUCOSE, CAPILLARY
Glucose-Capillary: 139 mg/dL — ABNORMAL HIGH (ref 70–99)
Glucose-Capillary: 115 mg/dL — ABNORMAL HIGH (ref 70–99)
Glucose-Capillary: 135 mg/dL — ABNORMAL HIGH (ref 70–99)
Glucose-Capillary: 75 mg/dL (ref 70–99)

## 2021-10-11 NOTE — Progress Notes (Addendum)
[facility]   Subjective  - no new complaints   Objective 105/66 85 98 F (36.7 C) (Oral) 16 100%  Intake/Output Summary (Last 24 hours) at 10/11/2021 0838 Last data filed at 10/11/2021 4481 Gross per 24 hour  Intake 393.33 ml  Output 1300 ml  Net -906.67 ml   Groins soft with incisional vacs in place Doppler signals intact B peroneals Lungs non labored breathing No acute distress eating breakfast Dry dressing right foot followed by DPM    Assessment/Planning: POD # 2 Bilateral lower extremity iliofemoral endarterectomies with bovine patch plasty Bilateral common iliac artery stenting 9 x 29 mm gore VBX stents Continue Plavix, ASA, and Crestor  PT/OT recommend HH I will place the order Continue to mobilize, will maintain incisional vas for 5-7 days. Possible discharge tomorrow  Roxy Horseman 10/11/2021 8:38 AM --  VASCULAR STAFF ADDENDUM: I have independently interviewed and examined the patient. I agree with the above.  Possible d/c tomorrow pending clinical improvement. Incisional VAC to stay in place for 7 days OOB as tolerated Appreciate podiatry seeing the foot yesterday.   Cassandria Santee, MD [facility] Phone Number: [phone] 10/11/2021 9:32 AM     Laboratory Lab Results: Recent Labs    10/09/21 0658 10/09/21 1019 10/10/21 0054  WBC 4.4  --  8.0  HGB 13.7 12.2* 12.8*  HCT 40.6 36.0* 35.9*  PLT 187  --  177   BMET Recent Labs    10/09/21 0658 10/09/21 1019 10/10/21 0054  NA 133* 132* 131*  K 5.2* 4.3 4.2  CL 100  --  100  CO2 23  --  20*  GLUCOSE 114*  --  149*  BUN 21  --  17  CREATININE 0.66  --  0.66  CALCIUM 10.2  --  9.1    COAG Lab Results  Component Value Date   INR 1.1 10/09/2021   INR 1.95 (H) 07/22/2014   INR 1.27 05/20/2014   No results found for: "PTT"

## 2021-10-11 NOTE — Progress Notes (Signed)
Physical Therapy Treatment Patient Details Name: [name] MRN: [id number] DOB: July 16, 1935 Today's Date: 10/11/2021   History of Present Illness 86 year old male with BLE critical limb ischemia s/p bilat iliofemoral endarterectomies with bovine patch plasty 10/5. 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 Pt with Rt knee pain in addition to bil groin pain and requires physical assist to rise and lower to chair since he uses lift chair at home. Pt educated for management of lines, assist with shoe donning and progressive mobility. Will continue to follow.     Recommendations for follow up therapy are one component of a multi-disciplinary discharge planning process, led by the attending physician.  Recommendations may be updated based on patient status, additional functional criteria and insurance authorization.  Follow Up Recommendations  Home health PT     Assistance Recommended at Discharge Frequent or constant Supervision/Assistance  Patient can return home with the following A little help with walking and/or transfers;Assistance with cooking/housework;Assist for transportation;A little help with bathing/dressing/bathroom   Equipment Recommendations  None recommended by PT    Recommendations for Other Services       Precautions / Restrictions Precautions Precautions: Fall Precaution Comments: wound vac bilat anterior hips (one vac, two tubes/sites) Restrictions Other Position/Activity Restrictions: For amb, wears sock/sneaker on L foot and flip flop on R foot because shoe won't fit it     Mobility  Bed Mobility               General bed mobility comments: in chair on arrival and end of session    Transfers Overall transfer level: Needs assistance   Transfers: Sit to/from Stand Sit to Stand: Mod assist           General transfer comment:  mod assist to rise from recliner, min to control descent with cues for hand and RLE placement    Ambulation/Gait Ambulation/Gait assistance: Min guard Gait Distance (Feet): 180 Feet Assistive device: Rollator (4 wheels) Gait Pattern/deviations: Step-through pattern, Decreased stride length, Trunk flexed   Gait velocity interpretation: <1.8 ft/sec, indicate of risk for recurrent falls   General Gait Details: slow, guarded gait. Frequent use of brakes on rollator for increased stability between steps.   Stairs             Wheelchair Mobility    Modified Rankin (Stroke Patients Only)       Balance Overall balance assessment: Needs assistance Sitting-balance support: No upper extremity supported, Feet supported Sitting balance-Leahy Scale: Good     Standing balance support: Bilateral upper extremity supported, During functional activity, Reliant on assistive device for balance Standing balance-Leahy Scale: Poor Standing balance comment: bil UE support in standing                            Cognition Arousal/Alertness: Awake/alert Behavior During Therapy: WFL for tasks assessed/performed Overall Cognitive Status: Within Functional Limits for tasks assessed                                          Exercises      General Comments        Pertinent Vitals/Pain Pain Assessment Pain Score: 7  Pain Location: bil groin and rt knee  Pain Descriptors / Indicators: Grimacing, Operative site guarding Pain Intervention(s): Limited activity within patient's tolerance, Monitored during session, Repositioned    Home Living                          Prior Function            PT Goals (current goals can now be found in the care plan section) Progress towards PT goals: Progressing toward goals    Frequency    Min 3X/week      PT Plan Current plan remains appropriate    Co-evaluation              AM-PAC PT "6 Clicks"  Mobility   Outcome Measure  Help needed turning from your back to your side while in a flat bed without using bedrails?: A Little Help needed moving from lying on your back to sitting on the side of a flat bed without using bedrails?: A Lot Help needed moving to and from a bed to a chair (including a wheelchair)?: A Little Help needed standing up from a chair using your arms (e.g., wheelchair or bedside chair)?: A Lot Help needed to walk in hospital room?: A Little Help needed climbing 3-5 steps with a railing? : A Lot 6 Click Score: 15    End of Session Equipment Utilized During Treatment: Gait belt Activity Tolerance: Patient tolerated treatment well Patient left: in chair;with call bell/phone within reach Nurse Communication: Mobility status PT Visit Diagnosis: Other abnormalities of gait and mobility (R26.89);Pain;Muscle weakness (generalized) (M62.81) Pain - Right/Left: Right Pain - part of body: Knee     Time: 1223-1253 PT Time Calculation (min) (ACUTE ONLY): 30 min  Charges:  $Gait Training: 8-22 mins $Therapeutic Activity: 8-22 mins                     Bayard Males, PT Acute Rehabilitation Services Office: [place] 10/11/2021, 12:57 PM

## 2021-10-12 LAB — GLUCOSE, CAPILLARY: Glucose-Capillary: 89 mg/dL (ref 70–99)

## 2021-10-12 MED ORDER — CLOPIDOGREL BISULFATE 75 MG PO TABS: 75.0000 mg | ORAL_TABLET | Freq: Every day | ORAL | 3 refills | Status: DC

## 2021-10-12 NOTE — Discharge Instructions (Signed)
Keep purple groin dressings in place to suction do 7 days then remove and throw it away.  You may shower daily once dressing removed.

## 2021-10-12 NOTE — Progress Notes (Addendum)
[facility] [place]  Subjective  - No new complaints   Objective 132/73 74 98 F (36.7 C) (Oral) 16 98%  Intake/Output Summary (Last 24 hours) at 10/12/2021 0724 Last data filed at 10/12/2021 0011 Gross per 24 hour  Intake --  Output 1200 ml  Net -1200 ml   General no acute distress Groins soft with incisional vacs in place Doppler signals intact B peroneals Lungs non labored breathing Dry dressing right foot followed by DPM  Assessment/Planning: POD # 3 Bilateral lower extremity iliofemoral endarterectomies with bovine patch plasty Bilateral common iliac artery stenting 9 x 29 mm gore VBX stents  Good inflow with doppler signals B Peroneal arteries Continue Plavix, ASA, and Crestor   Right heel ulcer is healing per DPM note.  Dressing changes with Medih[place] ordered yesterday by Dr. Sherryle Lis.  F/U OP with wound care.   HH PT recommended orders for [facility] placed yesterday. F/U in 2-3 weeks for incision checks.  Maintain incisional vac for 7 days.    [name] 10/12/2021 7:24 AM -- VASCULAR STAFF ADDENDUM: I have independently interviewed and examined the patient. I agree with the above.  Home today, VACs off Thursday   Cassandria Santee, MD [facility] Phone Number: [phone] 10/12/2021 9:55 AM    Laboratory Lab Results: Recent Labs    10/09/21 1019 10/10/21 0054  WBC  --  8.0  HGB 12.2* 12.8*  HCT 36.0* 35.9*  PLT  --  177   BMET Recent Labs    10/09/21 1019 10/10/21 0054  NA 132* 131*  K 4.3 4.2  CL  --  100  CO2  --  20*  GLUCOSE  --  149*  BUN  --  17  CREATININE  --  0.66  CALCIUM  --  9.1    COAG Lab Results  Component Value Date   INR 1.1 10/09/2021   INR 1.95 (H) 07/22/2014   INR 1.27 05/20/2014   No results found for: "PTT"

## 2021-10-12 NOTE — TOC Transition Note (Signed)
Transition of Care (TOC) - CM/SW Discharge Note Marvetta Gibbons RN, BSN Transitions of Care Unit 4E- RN Case Manager See Treatment Team for direct phone #    Patient Details  Name: [name] MRN: [id number] Date of Birth: 11-Mar-1935  Transition of Care [facility]) CM/SW Contact:  Dawayne Patricia, RN Phone Number: 10/12/2021, 10:53 AM   Clinical Narrative:    Pt stable for transition home today, HHPT/OT orders placed.  CM spoke with pt at bedside, discussed transition needs and HH recommendations. Pt agreeable to [facility] services. List provided for [facility] choice Per CMS guidelines from medicare.gov website with star ratings (copy placed in shadow chart), pt requested that his daughter be called regarding [place] agency.   Per pt he has needed DME, daughter to transport home.   Address, phone # and PCP all confirmed with pt in epic.   Call made to daughter for [facility] choice- awaiting return call  1040- return call received from daughter [name]- per daughter she does not have a preference for [facility] agency and will defer to CM to secure an agency on behalf of patient that is in-network with insurance. Daughter states she will be here in about 30 min. To provide transport home- bedside RN informed  Call made to Amy with [facility] for HHPT/OT referral- referral has been accepted with start of care within 48hr post discharge.   No further TOC needs noted.    Final next level of care: [place] Barriers to Discharge: No Barriers Identified   Patient Goals and CMS Choice Patient states their goals for this hospitalization and ongoing recovery are:: return home CMS Medicare.gov Compare Post Acute Care list provided to:: Patient Choice offered to / list presented to : Patient, Adult Children  Discharge Placement               Home w/ [facility]        Discharge Plan and Services   Discharge Planning Services: CM Consult Post Acute Care Choice: Home Health          DME Arranged: N/A DME  Agency: NA       HH Arranged: PT, OT HH Agency: [place] Date [place]: 10/12/21 Time [place]: 3212 Representative spoke with at [place]: Amy  Social Determinants of Health ([place]) Interventions     Readmission Risk Interventions    10/12/2021   10:53 AM  Readmission Risk Prevention Plan  Post Dischage Appt Complete  Medication Screening Complete  Transportation Screening Complete

## 2021-10-12 NOTE — Progress Notes (Signed)
Patient given discharge instructions. Daughter present. PIV removed. Telemetry box removed, CCMD notified. [place] wound vac placed.   Daymon Larsen, RN

## 2021-10-13 NOTE — Discharge Summary (Signed)
[facility] Discharge Summary   Patient ID:  [name] MRN: [id number] DOB/AGE: [birth year] 87 y.o.  Admit date: 10/09/2021 Discharge date: 10/12/21 Date of Surgery: 10/09/2021 Surgeon: Surgeon(s): [name], MD Cherre Robins, MD  Admission Diagnosis: Critical limb ischemia of both lower extremities [facility]) [I70.223] Critical limb ischemia of right lower extremity ([place]) [I70.221]  Discharge Diagnoses:  Critical limb ischemia of both lower extremities ([place]) [I70.223] Critical limb ischemia of right lower extremity ([place]) [I70.221]  Secondary Diagnoses: Past Medical History:  Diagnosis Date   Acute encephalopathy 05/09/2014   Alcoholism ([place])    Arthritis    Cerebral infarction due to embolism of left middle cerebral artery (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 male presenting with bilateral lower extremity critical limb ischemia with right-sided tissue loss.  Recent CT angiogram demonstrated inflow disease in bilateral common iliac arteries, as well as nearly-occlusive lesions in bilateral common femoral arteries.     Hospital Course:  [name] is a 86 y.o. male is S/P  Procedure(s): ILIOFEMORAL ENDARTERECTOMY INSERTION OF RETROGRADE ILIAC STENT ULTRASOUND GUIDANCE FOR VASCULAR ACCESS AORTOGRAM APPLICATION OF WOUND  VAC  Good inflow with doppler signals B Peroneal arteries Continue Plavix, ASA, and Crestor    Right heel ulcer is healing per DPM note.  Dressing changes with Medihoney ordered yesterday by Dr. Sherryle Lis.  F/U OP with wound care.   HH PT recommended orders for [facility] placed yesterday. F/U in 2-3 weeks for incision checks.  Maintain incisional vac for 7 days.      Significant Diagnostic Studies: CBC Lab Results  Component Value Date   WBC 8.0 10/10/2021   HGB 12.8 (L) 10/10/2021   HCT 35.9 (L) 10/10/2021   MCV 89.1 10/10/2021   PLT 177 10/10/2021    BMET    Component Value Date/Time   NA 131 (L) 10/10/2021 0054   K 4.2 10/10/2021 0054   CL 100 10/10/2021 0054   CO2 20 (L) 10/10/2021 0054   GLUCOSE 149 (H) 10/10/2021 0054   BUN 17 10/10/2021 0054   CREATININE 0.66 10/10/2021 0054   CALCIUM 9.1 10/10/2021 0054   GFRNONAA >60 10/10/2021 0054   GFRAA >60 07/24/2014 0323   COAG Lab Results  Component Value Date   INR 1.1 10/09/2021   INR 1.95 (H) 07/22/2014   INR 1.27 05/20/2014     Disposition:  Discharge to :Home Discharge Instructions     Call MD for:  redness, tenderness, or signs of infection (pain, swelling, bleeding, redness, odor or green/yellow discharge around incision site)   Complete by: As directed    Call MD for:  severe or increased pain, loss or decreased feeling  in affected limb(s)   Complete by: As directed    Call MD for:  temperature >100.5   Complete by: As directed    Discharge instructions  Complete by: As directed    Keep purple sponge dressing to suction for 7 days then remove dressings and throw it away.  Shower daily as needed after dressings are off.   Resume previous diet   Complete by: As directed       Allergies as of 10/12/2021       Reactions   Allopurinol Other (See Comments)   Makes head 'feel funny'   Atorvastatin Other (See Comments)   Unknown reaction    Motrin [ibuprofen] Other (See Comments)   Blisters in groin area         Medication List     TAKE these medications    amoxicillin 500 MG capsule Commonly known as: AMOXIL TAKE 1 CAPSULE BY MOUTH TWICE A DAY   APPLE CIDER VINEGAR PO Take 1 capsule by mouth daily.   aspirin EC 81 MG tablet Take 81 mg by mouth every other day. Swallow whole.   atenolol 50 MG tablet Commonly known as: TENORMIN Take 50 mg by mouth daily.   cefadroxil 500 MG capsule Commonly known as: DURICEF Take 1 capsule (500 mg total) by mouth 2 (two) times daily.   clopidogrel 75 MG tablet Commonly known as: PLAVIX Take 1 tablet (75 mg total) by mouth daily at 6 (six) AM.   doxycycline 100 MG tablet Commonly known as: VIBRA-TABS Take 1 tablet (100 mg total) by mouth 2 (two) times daily.   fluticasone 50 MCG/ACT nasal spray Commonly known as: FLONASE Place 2 sprays into both nostrils daily.   furosemide 20 MG tablet Commonly known as: LASIX Take 20 mg by mouth daily.   hydrALAZINE 25 MG tablet Commonly known as: APRESOLINE Take 1 tablet (25 mg total) by mouth every 8 (eight) hours. What changed: when to take this   irbesartan 75 MG tablet Commonly known as: AVAPRO Take 75 mg by mouth daily.   Lyrica 75 MG capsule Generic drug: pregabalin Take 75-150 mg by mouth See admin instructions. Take 75 mg by mouth in the morning and 150 mg at night   meloxicam 7.5 MG tablet Commonly known as: MOBIC Take 7.5 mg by mouth daily.   metFORMIN 1000 MG tablet Commonly known as: GLUCOPHAGE Take 1,000 mg by mouth in the morning and at bedtime.   multivitamin with minerals Tabs tablet Take 1 tablet by mouth daily.   neomycin-bacitracin-polymyxin [phone] ointment Apply 1 Application topically daily as needed (wound care).   OVER THE COUNTER MEDICATION Take 1 capsule by mouth daily. Balance of Nature Fruits   OVER THE COUNTER MEDICATION Take 1 tablet by mouth daily. Beet supplement   protein supplement shake Liqd Commonly known as: PREMIER PROTEIN Take 11  oz by mouth daily.   rosuvastatin 20 MG tablet Commonly known as: CRESTOR Take 1 tablet (20 mg total) by mouth daily.   traMADol 50 MG tablet Commonly known as: ULTRAM Take 2 tablets (100 mg total) by mouth every 6 (six) hours as needed. Every 4 hours   Uloric 80 MG Tabs Generic drug: Febuxostat Take 80 mg by mouth daily.   VISINE OP Place 1 drop into both eyes daily as needed (burning).       Verbal and written Discharge instructions given to the patient. Wound care per Discharge AVS  Follow-up Information     [name], MD Follow up in 2 week(s).   Specialty: Vascular Surgery Why: Office will call you to arrange your appt (sent) Contact information: [address] [place] [address] [phone]  [place]. Follow up.   Why: [facility] HHPT/OT arranged- they will contact you within 48 hr post discharge to schedule visits. Contact information: [address] [place] [address] [phone]                 Signed: Roxy Horseman 10/13/2021, 10:36 AM

## 2021-10-14 ENCOUNTER — Telehealth: Payer: Self-pay

## 2021-10-14 ENCOUNTER — Telehealth: Payer: Self-pay | Admitting: *Deleted

## 2021-10-14 DIAGNOSIS — G894 Chronic pain syndrome: Secondary | ICD-10-CM | POA: Diagnosis not present

## 2021-10-14 DIAGNOSIS — L97412 Non-pressure chronic ulcer of right heel and midfoot with fat layer exposed: Secondary | ICD-10-CM | POA: Diagnosis not present

## 2021-10-14 DIAGNOSIS — M25551 Pain in right hip: Secondary | ICD-10-CM | POA: Diagnosis not present

## 2021-10-14 DIAGNOSIS — Z48812 Encounter for surgical aftercare following surgery on the circulatory system: Secondary | ICD-10-CM | POA: Diagnosis not present

## 2021-10-14 DIAGNOSIS — I70223 Atherosclerosis of native arteries of extremities with rest pain, bilateral legs: Secondary | ICD-10-CM | POA: Diagnosis not present

## 2021-10-14 DIAGNOSIS — E1140 Type 2 diabetes mellitus with diabetic neuropathy, unspecified: Secondary | ICD-10-CM | POA: Diagnosis not present

## 2021-10-14 DIAGNOSIS — M109 Gout, unspecified: Secondary | ICD-10-CM | POA: Diagnosis not present

## 2021-10-14 DIAGNOSIS — I482 Chronic atrial fibrillation, unspecified: Secondary | ICD-10-CM | POA: Diagnosis not present

## 2021-10-14 DIAGNOSIS — E11621 Type 2 diabetes mellitus with foot ulcer: Secondary | ICD-10-CM | POA: Diagnosis not present

## 2021-10-14 NOTE — Telephone Encounter (Signed)
Pt's [facility] called to get VO for pt to do [facility] Physical Therapy weekly for 6 weeks. She will call podiatry for medihoney wound care orders. Pt will need to remain on Plavix for at least one month and will discuss coming off of this at his f/u next month. I have tried to reach pt to ensure he is aware of this, as [place] mentioned a possible drug to interaction with Mobic and Plavix. I have left VM for him to call us back.

## 2021-10-14 NOTE — Telephone Encounter (Addendum)
Nurse w/[facility]([phone])is calling to request verbal orders care wound care for right foot.Patient has seen at wound center in Sept. and is no longer going. Please advise. Or [phone].  Faxed /called wound care orders to [facility] ([place]), confirmation received 10/16/21.

## 2021-10-15 ENCOUNTER — Telehealth: Payer: Self-pay

## 2021-10-15 DIAGNOSIS — M25551 Pain in right hip: Secondary | ICD-10-CM | POA: Diagnosis not present

## 2021-10-15 DIAGNOSIS — I482 Chronic atrial fibrillation, unspecified: Secondary | ICD-10-CM | POA: Diagnosis not present

## 2021-10-15 DIAGNOSIS — I70223 Atherosclerosis of native arteries of extremities with rest pain, bilateral legs: Secondary | ICD-10-CM | POA: Diagnosis not present

## 2021-10-15 DIAGNOSIS — M109 Gout, unspecified: Secondary | ICD-10-CM | POA: Diagnosis not present

## 2021-10-15 DIAGNOSIS — Z48812 Encounter for surgical aftercare following surgery on the circulatory system: Secondary | ICD-10-CM | POA: Diagnosis not present

## 2021-10-15 DIAGNOSIS — E11621 Type 2 diabetes mellitus with foot ulcer: Secondary | ICD-10-CM | POA: Diagnosis not present

## 2021-10-15 DIAGNOSIS — G894 Chronic pain syndrome: Secondary | ICD-10-CM | POA: Diagnosis not present

## 2021-10-15 DIAGNOSIS — E1140 Type 2 diabetes mellitus with diabetic neuropathy, unspecified: Secondary | ICD-10-CM | POA: Diagnosis not present

## 2021-10-15 DIAGNOSIS — L97412 Non-pressure chronic ulcer of right heel and midfoot with fat layer exposed: Secondary | ICD-10-CM | POA: Diagnosis not present

## 2021-10-15 NOTE — Telephone Encounter (Signed)
Pt's OT with [name]) called for pt with him present due to pt wanting to confirm he was supposed to be on ASA and Plavix. I read them the D/C note from 10/12/21 stating he is to remain on this. Pt was contemplating not taking Plavix due to issues in the past on a blood thinner. We discussed the importance of this medication in regards to his recent stent and that he is to call us if he experiences any side effects. He is aware of his f/u in 3 weeks and will call us sooner if anything arises or he has further questions.

## 2021-10-17 DIAGNOSIS — I739 Peripheral vascular disease, unspecified: Secondary | ICD-10-CM | POA: Diagnosis not present

## 2021-10-20 DIAGNOSIS — E11621 Type 2 diabetes mellitus with foot ulcer: Secondary | ICD-10-CM | POA: Diagnosis not present

## 2021-10-20 DIAGNOSIS — I482 Chronic atrial fibrillation, unspecified: Secondary | ICD-10-CM | POA: Diagnosis not present

## 2021-10-20 DIAGNOSIS — M25551 Pain in right hip: Secondary | ICD-10-CM | POA: Diagnosis not present

## 2021-10-20 DIAGNOSIS — E1140 Type 2 diabetes mellitus with diabetic neuropathy, unspecified: Secondary | ICD-10-CM | POA: Diagnosis not present

## 2021-10-20 DIAGNOSIS — I70223 Atherosclerosis of native arteries of extremities with rest pain, bilateral legs: Secondary | ICD-10-CM | POA: Diagnosis not present

## 2021-10-20 DIAGNOSIS — G894 Chronic pain syndrome: Secondary | ICD-10-CM | POA: Diagnosis not present

## 2021-10-20 DIAGNOSIS — L97412 Non-pressure chronic ulcer of right heel and midfoot with fat layer exposed: Secondary | ICD-10-CM | POA: Diagnosis not present

## 2021-10-20 DIAGNOSIS — Z48812 Encounter for surgical aftercare following surgery on the circulatory system: Secondary | ICD-10-CM | POA: Diagnosis not present

## 2021-10-20 DIAGNOSIS — M109 Gout, unspecified: Secondary | ICD-10-CM | POA: Diagnosis not present

## 2021-10-22 DIAGNOSIS — E11621 Type 2 diabetes mellitus with foot ulcer: Secondary | ICD-10-CM | POA: Diagnosis not present

## 2021-10-22 DIAGNOSIS — M25551 Pain in right hip: Secondary | ICD-10-CM | POA: Diagnosis not present

## 2021-10-22 DIAGNOSIS — E1140 Type 2 diabetes mellitus with diabetic neuropathy, unspecified: Secondary | ICD-10-CM | POA: Diagnosis not present

## 2021-10-22 DIAGNOSIS — M109 Gout, unspecified: Secondary | ICD-10-CM | POA: Diagnosis not present

## 2021-10-22 DIAGNOSIS — G894 Chronic pain syndrome: Secondary | ICD-10-CM | POA: Diagnosis not present

## 2021-10-22 DIAGNOSIS — I70223 Atherosclerosis of native arteries of extremities with rest pain, bilateral legs: Secondary | ICD-10-CM | POA: Diagnosis not present

## 2021-10-22 DIAGNOSIS — Z48812 Encounter for surgical aftercare following surgery on the circulatory system: Secondary | ICD-10-CM | POA: Diagnosis not present

## 2021-10-22 DIAGNOSIS — L97412 Non-pressure chronic ulcer of right heel and midfoot with fat layer exposed: Secondary | ICD-10-CM | POA: Diagnosis not present

## 2021-10-22 DIAGNOSIS — I482 Chronic atrial fibrillation, unspecified: Secondary | ICD-10-CM | POA: Diagnosis not present

## 2021-10-23 ENCOUNTER — Other Ambulatory Visit: Payer: Self-pay | Admitting: *Deleted

## 2021-10-23 DIAGNOSIS — I70223 Atherosclerosis of native arteries of extremities with rest pain, bilateral legs: Secondary | ICD-10-CM

## 2021-10-23 DIAGNOSIS — I482 Chronic atrial fibrillation, unspecified: Secondary | ICD-10-CM | POA: Diagnosis not present

## 2021-10-23 DIAGNOSIS — G894 Chronic pain syndrome: Secondary | ICD-10-CM | POA: Diagnosis not present

## 2021-10-23 DIAGNOSIS — Z48812 Encounter for surgical aftercare following surgery on the circulatory system: Secondary | ICD-10-CM | POA: Diagnosis not present

## 2021-10-23 DIAGNOSIS — L97412 Non-pressure chronic ulcer of right heel and midfoot with fat layer exposed: Secondary | ICD-10-CM | POA: Diagnosis not present

## 2021-10-23 DIAGNOSIS — E11621 Type 2 diabetes mellitus with foot ulcer: Secondary | ICD-10-CM | POA: Diagnosis not present

## 2021-10-23 DIAGNOSIS — E1140 Type 2 diabetes mellitus with diabetic neuropathy, unspecified: Secondary | ICD-10-CM | POA: Diagnosis not present

## 2021-10-23 DIAGNOSIS — M25551 Pain in right hip: Secondary | ICD-10-CM | POA: Diagnosis not present

## 2021-10-23 DIAGNOSIS — M109 Gout, unspecified: Secondary | ICD-10-CM | POA: Diagnosis not present

## 2021-10-25 ENCOUNTER — Other Ambulatory Visit: Payer: Self-pay | Admitting: Internal Medicine

## 2021-10-25 ENCOUNTER — Other Ambulatory Visit: Payer: Self-pay | Admitting: Physician Assistant

## 2021-10-26 ENCOUNTER — Encounter: Payer: Self-pay | Admitting: Vascular Surgery

## 2021-10-27 ENCOUNTER — Ambulatory Visit (HOSPITAL_COMMUNITY)
Admission: RE | Admit: 2021-10-27 | Discharge: 2021-10-27 | Disposition: A | Discharge status: Home / Self Care | Payer: Medicare Other | Source: Ambulatory Visit | Attending: Physician Assistant | Admitting: Physician Assistant

## 2021-10-27 ENCOUNTER — Ambulatory Visit (INDEPENDENT_AMBULATORY_CARE_PROVIDER_SITE_OTHER)
Admission: RE | Admit: 2021-10-27 | Discharge: 2021-10-27 | Disposition: A | Discharge status: Home / Self Care | Payer: Medicare Other | Source: Ambulatory Visit | Attending: Surgery | Admitting: Surgery

## 2021-10-27 ENCOUNTER — Ambulatory Visit (INDEPENDENT_AMBULATORY_CARE_PROVIDER_SITE_OTHER): Payer: Medicare Other | Admitting: Physician Assistant

## 2021-10-27 ENCOUNTER — Telehealth: Payer: Self-pay

## 2021-10-27 VITALS — BP 146/79 | HR 76 | Temp 97.6°F | Resp 16 | Ht 68.0 in | Wt 185.0 lb

## 2021-10-27 DIAGNOSIS — M7989 Other specified soft tissue disorders: Secondary | ICD-10-CM

## 2021-10-27 DIAGNOSIS — I70223 Atherosclerosis of native arteries of extremities with rest pain, bilateral legs: Secondary | ICD-10-CM | POA: Insufficient documentation

## 2021-10-27 NOTE — Progress Notes (Signed)
POST OPERATIVE OFFICE NOTE    CC:  F/u for surgery  HPI:  [name] is a 86 y.o. male who is s/p bilateral iliofemoral endarterectomy with bilateral common iliac artery stents on 10/09/2021 by Dr. Virl Cagey.  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  While in the hospital, podiatry saw the patient. They expected that his heel wound should heal with vascular intervention.  They ordered him to do daily Medihoney dressings to the heel and follow-up outpatient with the wound care center.    The patient is here today earlier than his regularly scheduled follow-up postop.  He states about 2 days ago he started experiencing acute onset of right calf swelling. His left calf was not swollen at all.  He denies any pain in either leg.  He denies any changes to his motor function.  He denies any rest pain or claudication.  He thinks that his right heel wound has been getting better.  He thinks that his bilateral groin incisions have been healing appropriately.  Allergies  Allergen Reactions   Allopurinol Other (See Comments)    Makes head 'feel funny'   Atorvastatin Other (See Comments)    Unknown reaction    Motrin [Ibuprofen] Other (See Comments)    Blisters in groin area    Current Outpatient Medications  Medication Sig Dispense Refill   APPLE CIDER VINEGAR PO Take 1 capsule by mouth daily.     aspirin EC 81 MG tablet Take 81 mg by mouth every other day. Swallow whole.     atenolol (TENORMIN) 50 MG tablet Take 50 mg by mouth daily.     clopidogrel (PLAVIX) 75 MG tablet TAKE 1 TABLET (75 MG TOTAL) BY MOUTH DAILY AT 6 (SIX) AM. 90 tablet 1   doxycycline (VIBRA-TABS) 100 MG tablet Take 1 tablet (100 mg total) by mouth 2 (two) times daily. 60 tablet 1   fluticasone (FLONASE) 50 MCG/ACT nasal spray Place 2 sprays into both nostrils daily.     furosemide (LASIX) 20 MG tablet Take 20 mg by mouth daily.     hydrALAZINE (APRESOLINE) 25 MG tablet Take 1 tablet  (25 mg total) by mouth every 8 (eight) hours. (Patient taking differently: Take 25 mg by mouth 2 (two) times daily.)     irbesartan (AVAPRO) 75 MG tablet Take 75 mg by mouth daily.     LYRICA 75 MG capsule Take 75-150 mg by mouth See admin instructions. Take 75 mg by mouth in the morning and 150 mg at night     meloxicam (MOBIC) 7.5 MG tablet Take 7.5 mg by mouth daily.     metFORMIN (GLUCOPHAGE) 1000 MG tablet Take 1,000 mg by mouth in the morning and at bedtime.     Multiple Vitamin (MULTIVITAMIN WITH MINERALS) TABS tablet Take 1 tablet by mouth daily.     neomycin-bacitracin-polymyxin (NEOSPORIN) [phone] ointment Apply 1 Application topically daily as needed (wound care).     OVER THE COUNTER MEDICATION Take 1 capsule by mouth daily. Balance of Nature Fruits     OVER THE COUNTER MEDICATION Take 1 tablet by mouth daily. Beet supplement     protein supplement shake (PREMIER PROTEIN) LIQD Take 11 oz by mouth daily.     rosuvastatin (CRESTOR) 20 MG tablet Take 1 tablet (20 mg total) by mouth daily. 90 tablet 3   Tetrahydrozoline HCl (VISINE OP) Place 1 drop into both eyes daily as needed (burning).     traMADol (ULTRAM) 50  MG tablet Take 2 tablets (100 mg total) by mouth every 6 (six) hours as needed. Every 4 hours 30 tablet 0   ULORIC 80 MG TABS Take 80 mg by mouth daily.  3   amoxicillin (AMOXIL) 500 MG capsule TAKE 1 CAPSULE BY MOUTH TWICE A DAY (Patient not taking: Reported on 10/07/2021) 60 capsule 2   cefadroxil (DURICEF) 500 MG capsule Take 1 capsule (500 mg total) by mouth 2 (two) times daily. (Patient not taking: Reported on 09/29/2021) 60 capsule 1   No current facility-administered medications for this visit.     ROS:  See HPI  Physical Exam:  Incision: Bilateral groin incisions nearly healed Extremities: Right calf edema, noticeably larger than left calf.  No bruising evident on right calf.  Right heel ulcer improving.  Brisk bilateral peroneal Doppler signals Neuro:  intact  Studies: ABI:  +-------+-----------+-----------+------------+------------+  ABI/TBIToday's ABIToday's TBIPrevious ABIPrevious TBI  +-------+-----------+-----------+------------+------------+  Right  0.71       0.49       0.58        [name]            +-------+-----------+-----------+------------+------------+  Left   0.44       0.31       0.53        0.41          +-------+-----------+-----------+------------+------------+   RLE DVT Study:  RIGHT:  - There is no evidence of deep vein thrombosis in the lower extremity.  - There is no evidence of superficial venous thrombosis.   Assessment/Plan:  This is a 86 y.o. male who is s/p: Bilateral iliofemoral endarterectomies with bilateral common iliac artery stents on 10/09/2021 by Dr. Virl Cagey   -ABIs done at today's visit reveal improvement in the right ABI, it was 0.58 and is now 0.71 postop.  Slight drop in left ABI from 0.53 to 0.44.  Unsure as to why the patient's ABIs in the left leg did not improve postop, potentially issues with his common iliac stent.  We will attempt to visualize this at his next follow-up.  He is without rest pain or claudication.  He has brisk bilateral peroneal doppler signals -His bilateral groin incisions are healing appropriately.  His right heel ulcer is improving.  He will continue the Medihoney dressings and follow-up with the wound care center -Acute onset of right calf swelling 2 days ago in the setting of recent surgery increases my concern for acute DVT.  The patient got a right lower extremity DVT study today, but it was negative.  His swelling is likely due to drastically increased perfusion of the lower extremity.  He is aware that he can elevate his right leg as needed to help with the swelling -He can follow-up with [place] in 2 to 3 weeks on [place] office today with a PA.  At that time we will repeat ABIs and also get bilateral aortoiliac duplex study   Vicente Serene, PA-C [facility] [phone]   Clinic MD:  Trula Slade

## 2021-10-27 NOTE — Telephone Encounter (Signed)
Pt's daughter, [name], sent Mychart msgs with concerns for pt's leg, stating there is some dark discoloration and significant swelling.  Reviewed pt's chart, returned call for clarification, two identifiers used. Pt informed daughter this morning about leg being twice as large as the other, worse than before the surgery. He has been elevating and it continues to worsen. Pt denies any pain. Post-op appts moved earlier to today. Confirmed understanding.

## 2021-10-27 NOTE — Telephone Encounter (Signed)
Patient should have stopped antibiotics 09/20/21 per Dr. Henreitta Leber last note.   Beryle Flock, RN

## 2021-10-29 DIAGNOSIS — E1140 Type 2 diabetes mellitus with diabetic neuropathy, unspecified: Secondary | ICD-10-CM | POA: Diagnosis not present

## 2021-10-29 DIAGNOSIS — M25551 Pain in right hip: Secondary | ICD-10-CM | POA: Diagnosis not present

## 2021-10-29 DIAGNOSIS — I70223 Atherosclerosis of native arteries of extremities with rest pain, bilateral legs: Secondary | ICD-10-CM | POA: Diagnosis not present

## 2021-10-29 DIAGNOSIS — M109 Gout, unspecified: Secondary | ICD-10-CM | POA: Diagnosis not present

## 2021-10-29 DIAGNOSIS — G894 Chronic pain syndrome: Secondary | ICD-10-CM | POA: Diagnosis not present

## 2021-10-29 DIAGNOSIS — E11621 Type 2 diabetes mellitus with foot ulcer: Secondary | ICD-10-CM | POA: Diagnosis not present

## 2021-10-29 DIAGNOSIS — Z48812 Encounter for surgical aftercare following surgery on the circulatory system: Secondary | ICD-10-CM | POA: Diagnosis not present

## 2021-10-29 DIAGNOSIS — I482 Chronic atrial fibrillation, unspecified: Secondary | ICD-10-CM | POA: Diagnosis not present

## 2021-10-29 DIAGNOSIS — L97412 Non-pressure chronic ulcer of right heel and midfoot with fat layer exposed: Secondary | ICD-10-CM | POA: Diagnosis not present

## 2021-10-31 ENCOUNTER — Other Ambulatory Visit: Payer: Self-pay

## 2021-10-31 DIAGNOSIS — I70223 Atherosclerosis of native arteries of extremities with rest pain, bilateral legs: Secondary | ICD-10-CM

## 2021-11-03 DIAGNOSIS — I482 Chronic atrial fibrillation, unspecified: Secondary | ICD-10-CM | POA: Diagnosis not present

## 2021-11-03 DIAGNOSIS — L97412 Non-pressure chronic ulcer of right heel and midfoot with fat layer exposed: Secondary | ICD-10-CM | POA: Diagnosis not present

## 2021-11-03 DIAGNOSIS — E1140 Type 2 diabetes mellitus with diabetic neuropathy, unspecified: Secondary | ICD-10-CM | POA: Diagnosis not present

## 2021-11-03 DIAGNOSIS — M25551 Pain in right hip: Secondary | ICD-10-CM | POA: Diagnosis not present

## 2021-11-03 DIAGNOSIS — M109 Gout, unspecified: Secondary | ICD-10-CM | POA: Diagnosis not present

## 2021-11-03 DIAGNOSIS — Z48812 Encounter for surgical aftercare following surgery on the circulatory system: Secondary | ICD-10-CM | POA: Diagnosis not present

## 2021-11-03 DIAGNOSIS — I70223 Atherosclerosis of native arteries of extremities with rest pain, bilateral legs: Secondary | ICD-10-CM | POA: Diagnosis not present

## 2021-11-03 DIAGNOSIS — G894 Chronic pain syndrome: Secondary | ICD-10-CM | POA: Diagnosis not present

## 2021-11-03 DIAGNOSIS — E11621 Type 2 diabetes mellitus with foot ulcer: Secondary | ICD-10-CM | POA: Diagnosis not present

## 2021-11-04 DIAGNOSIS — M25551 Pain in right hip: Secondary | ICD-10-CM | POA: Diagnosis not present

## 2021-11-04 DIAGNOSIS — E11621 Type 2 diabetes mellitus with foot ulcer: Secondary | ICD-10-CM | POA: Diagnosis not present

## 2021-11-04 DIAGNOSIS — L97412 Non-pressure chronic ulcer of right heel and midfoot with fat layer exposed: Secondary | ICD-10-CM | POA: Diagnosis not present

## 2021-11-04 DIAGNOSIS — I70223 Atherosclerosis of native arteries of extremities with rest pain, bilateral legs: Secondary | ICD-10-CM | POA: Diagnosis not present

## 2021-11-04 DIAGNOSIS — G894 Chronic pain syndrome: Secondary | ICD-10-CM | POA: Diagnosis not present

## 2021-11-04 DIAGNOSIS — E1140 Type 2 diabetes mellitus with diabetic neuropathy, unspecified: Secondary | ICD-10-CM | POA: Diagnosis not present

## 2021-11-04 DIAGNOSIS — Z48812 Encounter for surgical aftercare following surgery on the circulatory system: Secondary | ICD-10-CM | POA: Diagnosis not present

## 2021-11-04 DIAGNOSIS — I482 Chronic atrial fibrillation, unspecified: Secondary | ICD-10-CM | POA: Diagnosis not present

## 2021-11-04 DIAGNOSIS — M109 Gout, unspecified: Secondary | ICD-10-CM | POA: Diagnosis not present

## 2021-11-07 ENCOUNTER — Encounter (HOSPITAL_COMMUNITY): Payer: Medicare Other

## 2021-11-12 DIAGNOSIS — G894 Chronic pain syndrome: Secondary | ICD-10-CM | POA: Diagnosis not present

## 2021-11-12 DIAGNOSIS — Z48812 Encounter for surgical aftercare following surgery on the circulatory system: Secondary | ICD-10-CM | POA: Diagnosis not present

## 2021-11-12 DIAGNOSIS — E1140 Type 2 diabetes mellitus with diabetic neuropathy, unspecified: Secondary | ICD-10-CM | POA: Diagnosis not present

## 2021-11-12 DIAGNOSIS — M109 Gout, unspecified: Secondary | ICD-10-CM | POA: Diagnosis not present

## 2021-11-12 DIAGNOSIS — M25551 Pain in right hip: Secondary | ICD-10-CM | POA: Diagnosis not present

## 2021-11-12 DIAGNOSIS — E11621 Type 2 diabetes mellitus with foot ulcer: Secondary | ICD-10-CM | POA: Diagnosis not present

## 2021-11-12 DIAGNOSIS — I70223 Atherosclerosis of native arteries of extremities with rest pain, bilateral legs: Secondary | ICD-10-CM | POA: Diagnosis not present

## 2021-11-12 DIAGNOSIS — I482 Chronic atrial fibrillation, unspecified: Secondary | ICD-10-CM | POA: Diagnosis not present

## 2021-11-12 DIAGNOSIS — L97412 Non-pressure chronic ulcer of right heel and midfoot with fat layer exposed: Secondary | ICD-10-CM | POA: Diagnosis not present

## 2021-11-13 DIAGNOSIS — L97512 Non-pressure chronic ulcer of other part of right foot with fat layer exposed: Secondary | ICD-10-CM | POA: Diagnosis not present

## 2021-11-13 DIAGNOSIS — T148XXA Other injury of unspecified body region, initial encounter: Secondary | ICD-10-CM | POA: Diagnosis not present

## 2021-11-14 ENCOUNTER — Telehealth: Payer: Self-pay | Admitting: Podiatry

## 2021-11-14 NOTE — Telephone Encounter (Signed)
[name] from [facility] [place] called on behalf of pt who has a wound on his toe. Pt was treated in the their office & received antibiotics for the wound. He is sched to see a wound doctor on Monday, 11/13 for his toe; office also wanted to pt to see [name] for wound as well. Please advise

## 2021-11-17 ENCOUNTER — Encounter (HOSPITAL_BASED_OUTPATIENT_CLINIC_OR_DEPARTMENT_OTHER): Payer: Medicare Other | Attending: Internal Medicine | Admitting: Internal Medicine

## 2021-11-17 DIAGNOSIS — L97512 Non-pressure chronic ulcer of other part of right foot with fat layer exposed: Secondary | ICD-10-CM | POA: Diagnosis not present

## 2021-11-17 DIAGNOSIS — I739 Peripheral vascular disease, unspecified: Secondary | ICD-10-CM

## 2021-11-17 DIAGNOSIS — M86171 Other acute osteomyelitis, right ankle and foot: Secondary | ICD-10-CM | POA: Diagnosis not present

## 2021-11-17 DIAGNOSIS — L97412 Non-pressure chronic ulcer of right heel and midfoot with fat layer exposed: Secondary | ICD-10-CM | POA: Diagnosis not present

## 2021-11-17 DIAGNOSIS — E1142 Type 2 diabetes mellitus with diabetic polyneuropathy: Secondary | ICD-10-CM | POA: Insufficient documentation

## 2021-11-17 DIAGNOSIS — E11621 Type 2 diabetes mellitus with foot ulcer: Secondary | ICD-10-CM

## 2021-11-17 DIAGNOSIS — G894 Chronic pain syndrome: Secondary | ICD-10-CM | POA: Diagnosis not present

## 2021-11-17 DIAGNOSIS — E1151 Type 2 diabetes mellitus with diabetic peripheral angiopathy without gangrene: Secondary | ICD-10-CM | POA: Diagnosis not present

## 2021-11-17 DIAGNOSIS — M25551 Pain in right hip: Secondary | ICD-10-CM | POA: Diagnosis not present

## 2021-11-17 DIAGNOSIS — E1140 Type 2 diabetes mellitus with diabetic neuropathy, unspecified: Secondary | ICD-10-CM

## 2021-11-17 DIAGNOSIS — M109 Gout, unspecified: Secondary | ICD-10-CM | POA: Diagnosis not present

## 2021-11-17 DIAGNOSIS — I4891 Unspecified atrial fibrillation: Secondary | ICD-10-CM | POA: Diagnosis not present

## 2021-11-17 DIAGNOSIS — Z48812 Encounter for surgical aftercare following surgery on the circulatory system: Secondary | ICD-10-CM | POA: Diagnosis not present

## 2021-11-17 DIAGNOSIS — I70223 Atherosclerosis of native arteries of extremities with rest pain, bilateral legs: Secondary | ICD-10-CM | POA: Diagnosis not present

## 2021-11-17 DIAGNOSIS — I482 Chronic atrial fibrillation, unspecified: Secondary | ICD-10-CM | POA: Diagnosis not present

## 2021-11-19 ENCOUNTER — Other Ambulatory Visit: Payer: Self-pay | Admitting: Internal Medicine

## 2021-11-19 NOTE — Progress Notes (Signed)
[name], [name] ([id number]) [id number] Page 1 of 11 Visit Report for 11/17/2021 Arrival Information Details Patient Name: Date of Service: [name], [name] 11/17/2021 12:45 PM Medical Record Number: [id number] Patient Account Number: [id number] Date of Birth/Sex: Treating RN: January 20, 1935 (86 y.o. [name] Primary Care Jailen Coward: Dorthy Cooler, Dibas Other Clinician: Referring Dorotha Hirschi: Treating Elnoria Livingston/Extender: Alanda Slim, Dibas Weeks in Treatment: 11 Visit Information History Since Last Visit Added or deleted any medications: No Patient Arrived: Walker Any new allergies or adverse reactions: No Arrival Time: 12:58 Had a fall or experienced change in No Accompanied By: son activities of daily living that may affect Transfer Assistance: None risk of falls: Patient Identification Verified: Yes Signs or symptoms of abuse/neglect since last visito No Secondary Verification Process Completed: Yes Hospitalized since last visit: Yes Patient Requires Transmission-Based Precautions: No Implantable device outside of the clinic excluding No Patient Has Alerts: Yes cellular tissue based products placed in the center Patient Alerts: Patient on Blood Thinner since last visit: ABI R=.58 TBI R=[place] Pain Present Now: Yes Electronic Signature(s) Signed: 11/18/2021 5:53:09 PM By: Deon Pilling RN, BSN Entered By: Deon Pilling on 11/17/2021 12:59:13 -------------------------------------------------------------------------------- Clinic Level of Care Assessment Details Patient Name: Date of Service: [name], [name] 11/17/2021 12:45 PM Medical Record Number: [id number] Patient Account Number: [id number] Date of Birth/Sex: Treating RN: 1935-09-01 (86 y.o. [name], Lauren Primary Care Bronda Alfred: Dorthy Cooler, Dibas Other Clinician: Referring Graiden Henes: Treating Aarron Wierzbicki/Extender: Alanda Slim, Dibas Weeks in Treatment: 11 Clinic Level of Care  Assessment Items TOOL 4 Quantity Score X- 1 0 Use when only an EandM is performed on FOLLOW-UP visit ASSESSMENTS - Nursing Assessment / Reassessment X- 1 10 Reassessment of Co-morbidities (includes updates in patient status) X- 1 5 Reassessment of Adherence to Treatment Plan ASSESSMENTS - Wound and Skin A ssessment / Reassessment '[]'$  - 0 Simple Wound Assessment / Reassessment - one wound X- 2 5 Complex Wound Assessment / Reassessment - multiple wounds '[]'$  - 0 Dermatologic / Skin Assessment (not related to wound area) ASSESSMENTS - Focused Assessment X- 1 5 Circumferential Edema Measurements - multi extremities '[]'$  - 0 Nutritional Assessment / Counseling / Intervention '[]'$  - 0 Lower Extremity Assessment (monofilament, tuning fork, pulses) [name], [name] L ([id number]) [id number] Page 2 of 11 '[]'$  - 0 Peripheral Arterial Disease Assessment (using hand held doppler) ASSESSMENTS - Ostomy and/or Continence Assessment and Care '[]'$  - 0 Incontinence Assessment and Management '[]'$  - 0 Ostomy Care Assessment and Management (repouching, etc.) PROCESS - Coordination of Care '[]'$  - 0 Simple Patient / Family Education for ongoing care X- 1 20 Complex (extensive) Patient / Family Education for ongoing care X- 1 10 Staff obtains Programmer, systems, Records, T Results / Process Orders est '[]'$  - 0 Staff telephones HHA, Nursing Homes / Clarify orders / etc '[]'$  - 0 Routine Transfer to another Facility (non-emergent condition) '[]'$  - 0 Routine Hospital Admission (non-emergent condition) '[]'$  - 0 New Admissions / Biomedical engineer / Ordering NPWT Apligraf, etc. , '[]'$  - 0 Emergency Hospital Admission (emergent condition) '[]'$  - 0 Simple Discharge Coordination X- 1 15 Complex (extensive) Discharge Coordination PROCESS - Special Needs '[]'$  - 0 Pediatric / Minor Patient Management '[]'$  - 0 Isolation Patient Management '[]'$  - 0 Hearing / Language / Visual special needs '[]'$  - 0 Assessment  of Community assistance (transportation, D/C planning, etc.) '[]'$  - 0 Additional assistance / Altered mentation '[]'$  - 0 Support Surface(s) Assessment (bed, cushion, seat, etc.) INTERVENTIONS - Wound Cleansing / Measurement '[]'$  - 0 Simple  Wound Cleansing - one wound X- 2 5 Complex Wound Cleansing - multiple wounds X- 1 5 Wound Imaging (photographs - any number of wounds) '[]'$  - 0 Wound Tracing (instead of photographs) '[]'$  - 0 Simple Wound Measurement - one wound X- 2 5 Complex Wound Measurement - multiple wounds INTERVENTIONS - Wound Dressings '[]'$  - 0 Small Wound Dressing one or multiple wounds X- 2 15 Medium Wound Dressing one or multiple wounds '[]'$  - 0 Large Wound Dressing one or multiple wounds X- 1 5 Application of Medications - topical '[]'$  - 0 Application of Medications - injection INTERVENTIONS - Miscellaneous '[]'$  - 0 External ear exam '[]'$  - 0 Specimen Collection (cultures, biopsies, blood, body fluids, etc.) '[]'$  - 0 Specimen(s) / Culture(s) sent or taken to Lab for analysis '[]'$  - 0 Patient Transfer (multiple staff / Civil Service fast streamer / Similar devices) '[]'$  - 0 Simple Staple / Suture removal (25 or less) '[]'$  - 0 Complex Staple / Suture removal (26 or more) '[]'$  - 0 Hypo / Hyperglycemic Management (close monitor of Blood Glucose) '[]'$  - 0 Ankle / Brachial Index (ABI) - do not check if billed separately [name], [name] ([id number]) [id number] Page 3 of 11 X- 1 5 Vital Signs Has the patient been seen at the hospital within the last three years: Yes Total Score: 140 Level Of Care: New/Established - Level 4 Electronic Signature(s) Signed: 11/17/2021 4:10:29 PM By: Rhae Hammock RN Entered By: Rhae Hammock on 11/17/2021 13:33:17 -------------------------------------------------------------------------------- Encounter Discharge Information Details Patient Name: Date of Service: Kenneth Mons MES L. 11/17/2021 12:45 PM Medical Record Number:  [id number] Patient Account Number: [id number] Date of Birth/Sex: Treating RN: 09-07-1935 (86 y.o. [name] Primary Care March Joos: Dorthy Cooler, Dibas Other Clinician: Referring Abygayle Deltoro: Treating Dewaine Morocho/Extender: Alanda Slim, Dibas Weeks in Treatment: 11 Encounter Discharge Information Items Discharge Condition: Stable Ambulatory Status: Walker Discharge Destination: Home Transportation: Private Auto Accompanied By: family Schedule Follow-up Appointment: Yes Clinical Summary of Care: Patient Declined Electronic Signature(s) Signed: 11/17/2021 4:10:29 PM By: Rhae Hammock RN Entered By: Rhae Hammock on 11/17/2021 13:33:57 -------------------------------------------------------------------------------- Lower Extremity Assessment Details Patient Name: Date of Service: Kenneth Mons MES L. 11/17/2021 12:45 PM Medical Record Number: [id number] Patient Account Number: [id number] Date of Birth/Sex: Treating RN: 1935-11-17 (86 y.o. [name] Primary Care Alonte Wulff: Dorthy Cooler, Dibas Other Clinician: Referring Cache Bills: Treating Ladene Allocca/Extender: Alanda Slim, Dibas Weeks in Treatment: 11 Edema Assessment Assessed: [Left: No] [Right: Yes] Edema: [Left: Ye] [Right: s] Calf Left: Right: Point of Measurement: 42 cm From Medial Instep 41 cm Ankle Left: Right: Point of Measurement: 8 cm From Medial Instep 25 cm Electronic Signature(s) Signed: 11/18/2021 5:53:09 PM By: Deon Pilling RN, BSN [name] ([id number]) [id number] Page 4 of 11 Entered By: Deon Pilling on 11/17/2021 13:01:32 -------------------------------------------------------------------------------- Multi Wound Chart Details Patient Name: Date of Service: TEANDRE, HAMRE MES L. 11/17/2021 12:45 PM Medical Record Number: [id number] Patient Account Number: [id number] Date of Birth/Sex: Treating RN: 1935/01/24 (86 y.o. M) Primary Care Zivah Mayr: Dorthy Cooler, Dibas  Other Clinician: Referring Elka Satterfield: Treating Shamari Trostel/Extender: Alanda Slim, Dibas Weeks in Treatment: 11 Vital Signs Height(in): 68 Pulse(bpm): 71 Weight(lbs): 198 Blood Pressure(mmHg): 155/86 Body Mass Index(BMI): 30.1 Temperature(F): 98.1 Respiratory Rate(breaths/min): 20 [1:Photos:] [N/A:N/A] Right Calcaneus Right T Great oe N/A Wound Location: Gradually Appeared Trauma N/A Wounding Event: Diabetic Wound/Ulcer of the Lower Diabetic Wound/Ulcer of the Lower N/A Primary Etiology: Extremity Extremity N/A Abrasion N/A Secondary Etiology: Cataracts, Angina, Type II Diabetes, Cataracts, Angina, Type II Diabetes, N/A Comorbid History: Gout, Osteoarthritis, Neuropathy  Gout, Osteoarthritis, Neuropathy 06/05/2021 11/10/2021 N/A Date Acquired: 11 0 N/A Weeks of Treatment: Open Open N/A Wound Status: No No N/A Wound Recurrence: 0.7x0.7x0.1 1x1x0.1 N/A Measurements L x W x D (cm) 0.385 0.785 N/A A (cm) : rea 0.038 0.079 N/A Volume (cm) : 74.90% N/A N/A % Reduction in A rea: 91.70% N/A N/A % Reduction in Volume: Grade 1 Grade 1 N/A Classification: Medium Medium N/A Exudate A mount: Serosanguineous Serosanguineous N/A Exudate Type: red, brown red, brown N/A Exudate Color: Well defined, not attached Distinct, outline attached N/A Wound Margin: Large (67-100%) Small (1-33%) N/A Granulation A mount: Red Red, Pink N/A Granulation Quality: None Present (0%) Large (67-100%) N/A Necrotic A mount: Fat Layer (Subcutaneous Tissue): Yes Fat Layer (Subcutaneous Tissue): Yes N/A Exposed Structures: Fascia: No Fascia: No Tendon: No Tendon: No Muscle: No Muscle: No Joint: No Joint: No Bone: No Bone: No Large (67-100%) Small (1-33%) N/A Epithelialization: Excoriation: No Excoriation: No N/A Periwound Skin Texture: Induration: No Induration: No Callus: No Callus: No Crepitus: No Crepitus: No Rash: No Rash: No Scarring: No Scarring:  No Maceration: No Maceration: No N/A Periwound Skin Moisture: Dry/Scaly: No Dry/Scaly: No Atrophie Blanche: No Atrophie Blanche: No N/A Periwound Skin Color: Cyanosis: No Cyanosis: No Ecchymosis: No Ecchymosis: No Erythema: No Erythema: No Hemosiderin Staining: No Hemosiderin Staining: No [name], [name] ([id number]) [id number] Page 5 of 11 Mottled: No Mottled: No Pallor: No Pallor: No Rubor: No Rubor: No Treatment Notes Wound #1 (Calcaneus) Wound Laterality: Right Cleanser Soap and Water Discharge Instruction: May shower and wash wound with dial antibacterial soap and water prior to dressing change. Peri-Wound Care Skin Prep Discharge Instruction: Use skin prep as directed Topical Primary Dressing Hydrofera Blue Ready Foam, 2.5 x2.5 in Discharge Instruction: Apply to wound bed as instructed MediHoney Gel, tube 1.5 (oz) Discharge Instruction: Apply to wound bed as instructed Secondary Dressing ALLEVYN Gentle Border, 3x3 (in/in) Discharge Instruction: Apply over primary dressing as directed. Secured With Compression Wrap Compression Stockings Add-Ons Cotton Tip Swabs Wound #2 (Toe Great) Wound Laterality: Right Cleanser Soap and Water Discharge Instruction: May shower and wash wound with dial antibacterial soap and water prior to dressing change. Peri-Wound Care Skin Prep Discharge Instruction: Use skin prep as directed Topical Primary Dressing Hydrofera Blue Ready Foam, 2.5 x2.5 in Discharge Instruction: Apply to wound bed as instructed MediHoney Gel, tube 1.5 (oz) Discharge Instruction: Apply to wound bed as instructed Secondary Dressing ALLEVYN Gentle Border, 3x3 (in/in) Discharge Instruction: Apply over primary dressing as directed. Secured With Compression Wrap Compression Stockings Add-Ons Cotton Tip Swabs Electronic Signature(s) Signed: 11/18/2021 6:10:28 PM By: Kalman Shan DO Entered By: Kalman Shan on  11/17/2021 13:35:43 Gallaga, [name] ([id number]) [id number] Page 6 of 11 -------------------------------------------------------------------------------- Multi-Disciplinary Care Plan Details Patient Name: Date of Service: [name], [name] MES L. 11/17/2021 12:45 PM Medical Record Number: [id number] Patient Account Number: [id number] Date of Birth/Sex: Treating RN: 21-Feb-1935 (86 y.o. [name] Primary Care Ygnacio Fecteau: Dorthy Cooler, Dibas Other Clinician: Referring Bradlee Heitman: Treating Karsyn Jamie/Extender: Alanda Slim, Dibas Weeks in Treatment: 11 Active Inactive Pressure Nursing Diagnoses: Knowledge deficit related to management of pressures ulcers Goals: Patient/caregiver will verbalize understanding of pressure ulcer management Date Initiated: 08/26/2021 Target Resolution Date: 12/06/2021 Goal Status: Active Interventions: Assess: immobility, friction, shearing, incontinence upon admission and as needed Assess offloading mechanisms upon admission and as needed Provide education on pressure ulcers Notes: Wound/Skin Impairment Nursing Diagnoses: Impaired tissue integrity Goals: Patient/caregiver will verbalize understanding of skin care regimen Date Initiated: 08/26/2021 Target Resolution Date:  12/06/2021 Goal Status: Active Ulcer/skin breakdown will have a volume reduction of 30% by week 4 Date Initiated: 08/26/2021 Target Resolution Date: 01/03/2022 Goal Status: Active Interventions: Assess patient/caregiver ability to obtain necessary supplies Assess patient/caregiver ability to perform ulcer/skin care regimen upon admission and as needed Assess ulceration(s) every visit Provide education on ulcer and skin care Treatment Activities: Topical wound management initiated : 08/26/2021 Notes: Electronic Signature(s) Signed: 11/17/2021 4:10:29 PM By: Rhae Hammock RN Entered By: Rhae Hammock on 11/17/2021  13:30:24 -------------------------------------------------------------------------------- Pain Assessment Details Patient Name: Date of Service: Kenneth Mons MES L. 11/17/2021 12:45 PM Medical Record Number: [id number] Patient Account Number: [id number] [name], [name] ([id number]) [phone].pdf Page 7 of 11 Date of Birth/Sex: Treating RN: [birth year] (86 y.o. [name] Primary Care Kamree Wiens: Other Clinician: Dorthy Cooler, Dibas Referring Janmichael Giraud: Treating Siara Gorder/Extender: Alanda Slim, Dibas Weeks in Treatment: 11 Active Problems Location of Pain Severity and Description of Pain Patient Has Paino No Site Locations Rate the pain. Current Pain Level: 0 Pain Management and Medication Current Pain Management: Notes currently 0/10. Electronic Signature(s) Signed: 11/18/2021 5:53:09 PM By: Deon Pilling RN, BSN Entered By: Deon Pilling on 11/17/2021 13:05:37 -------------------------------------------------------------------------------- Patient/Caregiver Education Details Patient Name: Date of Service: Kenneth Mons MES L. 11/13/2023andnbsp12:45 PM Medical Record Number: [id number] Patient Account Number: [id number] Date of Birth/Gender: Treating RN: 16-Dec-1935 (86 y.o. [name] Primary Care Physician: Dorthy Cooler, Dibas Other Clinician: Referring Physician: Treating Physician/Extender: Alanda Slim, Dibas Weeks in Treatment: 11 Education Assessment Education Provided To: Patient Education Topics Provided Pressure: Methods: Explain/Verbal Responses: Reinforcements needed, State content correctly Electronic Signature(s) Signed: 11/17/2021 4:10:29 PM By: Rhae Hammock RN Entered By: Rhae Hammock on 11/17/2021 13:30:40 [name] ([id number]) [id number] Page 8 of 11 -------------------------------------------------------------------------------- Wound Assessment Details Patient Name:  Date of Service: [name], [name] MES L. 11/17/2021 12:45 PM Medical Record Number: [id number] Patient Account Number: [id number] Date of Birth/Sex: Treating RN: 08/20/1935 (86 y.o. [name] Primary Care Erynne Kealey: Dorthy Cooler, Dibas Other Clinician: Referring Mahli Glahn: Treating Avraj Lindroth/Extender: Alanda Slim, Dibas Weeks in Treatment: 11 Wound Status Wound Number: 1 Primary Diabetic Wound/Ulcer of the Lower Extremity Etiology: Wound Location: Right Calcaneus Wound Status: Open Wounding Event: Gradually Appeared Comorbid Cataracts, Angina, Type II Diabetes, Gout, Osteoarthritis, Date Acquired: 06/05/2021 History: Neuropathy Weeks Of Treatment: 11 Clustered Wound: No Photos Wound Measurements Length: (cm) 0. Width: (cm) 0. Depth: (cm) 0. Area: (cm) 0 Volume: (cm) 0 7 % Reduction in Area: 74.9% 7 % Reduction in Volume: 91.7% 1 Epithelialization: Large (67-100%) .385 Tunneling: No .038 Undermining: No Wound Description Classification: Grade 1 Wound Margin: Well defined, not attached Exudate Amount: Medium Exudate Type: Serosanguineous Exudate Color: red, brown Foul Odor After Cleansing: No Slough/Fibrino No Wound Bed Granulation Amount: Large (67-100%) Exposed Structure Granulation Quality: Red Fascia Exposed: No Necrotic Amount: None Present (0%) Fat Layer (Subcutaneous Tissue) Exposed: Yes Tendon Exposed: No Muscle Exposed: No Joint Exposed: No Bone Exposed: No Periwound Skin Texture Texture Color No Abnormalities Noted: No No Abnormalities Noted: No Callus: No Atrophie Blanche: No Crepitus: No Cyanosis: No Excoriation: No Ecchymosis: No Induration: No Erythema: No Rash: No Hemosiderin Staining: No Scarring: No Mottled: No Pallor: No Moisture Rubor: No No Abnormalities Noted: No Dry / Scaly: No Maceration: No [name], [name] ([id number]) [id number] Page 9 of 11 Treatment Notes Wound #1 (Calcaneus) Wound  Laterality: Right Cleanser Soap and Water Discharge Instruction: May shower and wash wound with dial antibacterial soap and water prior to dressing change. Peri-Wound Care Skin Prep Discharge Instruction: Use  skin prep as directed Topical Primary Dressing Hydrofera Blue Ready Foam, 2.5 x2.5 in Discharge Instruction: Apply to wound bed as instructed MediHoney Gel, tube 1.5 (oz) Discharge Instruction: Apply to wound bed as instructed Secondary Dressing ALLEVYN Gentle Border, 3x3 (in/in) Discharge Instruction: Apply over primary dressing as directed. Secured With Compression Wrap Compression Stockings Add-Ons Cotton Tip Swabs Electronic Signature(s) Signed: 11/18/2021 5:53:09 PM By: Deon Pilling RN, BSN Entered By: Deon Pilling on 11/17/2021 13:11:24 -------------------------------------------------------------------------------- Wound Assessment Details Patient Name: Date of Service: Kenneth Mons MES L. 11/17/2021 12:45 PM Medical Record Number: [id number] Patient Account Number: [id number] Date of Birth/Sex: Treating RN: Dec 07, 1935 (86 y.o. [name] Primary Care Bronwen Pendergraft: Dorthy Cooler, Dibas Other Clinician: Referring Lizbett Garciagarcia: Treating Sehaj Kolden/Extender: Alanda Slim, Dibas Weeks in Treatment: 11 Wound Status Wound Number: 2 Primary Etiology: Diabetic Wound/Ulcer of the Lower Extremity Wound Location: Right T Great oe Secondary Abrasion Etiology: Wounding Event: Trauma Wound Status: Open Date Acquired: 11/10/2021 Comorbid Cataracts, Angina, Type II Diabetes, Gout, Osteoarthritis, Weeks Of Treatment: 0 History: Neuropathy Clustered Wound: No Photos [name], [name] ([id number]) [id number] Page 10 of 11 Wound Measurements Length: (cm) 1 Width: (cm) 1 Depth: (cm) 0.1 Area: (cm) 0.785 Volume: (cm) 0.079 % Reduction in Area: % Reduction in Volume: Epithelialization: Small (1-33%) Tunneling: No Undermining: No Wound  Description Classification: Grade 1 Wound Margin: Distinct, outline attached Exudate Amount: Medium Exudate Type: Serosanguineous Exudate Color: red, brown Foul Odor After Cleansing: No Slough/Fibrino Yes Wound Bed Granulation Amount: Small (1-33%) Exposed Structure Granulation Quality: Red, Pink Fascia Exposed: No Necrotic Amount: Large (67-100%) Fat Layer (Subcutaneous Tissue) Exposed: Yes Necrotic Quality: Adherent Slough Tendon Exposed: No Muscle Exposed: No Joint Exposed: No Bone Exposed: No Periwound Skin Texture Texture Color No Abnormalities Noted: No No Abnormalities Noted: No Callus: No Atrophie Blanche: No Crepitus: No Cyanosis: No Excoriation: No Ecchymosis: No Induration: No Erythema: No Rash: No Hemosiderin Staining: No Scarring: No Mottled: No Pallor: No Moisture Rubor: No No Abnormalities Noted: No Dry / Scaly: No Maceration: No Treatment Notes Wound #2 (Toe Great) Wound Laterality: Right Cleanser Soap and Water Discharge Instruction: May shower and wash wound with dial antibacterial soap and water prior to dressing change. Peri-Wound Care Skin Prep Discharge Instruction: Use skin prep as directed Topical Primary Dressing Hydrofera Blue Ready Foam, 2.5 x2.5 in Discharge Instruction: Apply to wound bed as instructed MediHoney Gel, tube 1.5 (oz) Discharge Instruction: Apply to wound bed as instructed Secondary Dressing ALLEVYN Gentle Border, 3x3 (in/in) Discharge Instruction: Apply over primary dressing as directed. [name], [name] ([id number]) [id number] Page 11 of 11 Secured With Compression Wrap Compression Stockings Add-Ons Cotton Tip Swabs Electronic Signature(s) Signed: 11/18/2021 5:53:09 PM By: Deon Pilling RN, BSN Entered By: Deon Pilling on 11/17/2021 13:12:44 -------------------------------------------------------------------------------- Vitals Details Patient Name: Date of Service: Kenneth Mons MES  L. 11/17/2021 12:45 PM Medical Record Number: [id number] Patient Account Number: [id number] Date of Birth/Sex: Treating RN: 1935/09/07 (86 y.o. [name] Primary Care Annelle Behrendt: Dorthy Cooler, Dibas Other Clinician: Referring Katilin Raynes: Treating Jelisha Weed/Extender: Alanda Slim, Dibas Weeks in Treatment: 11 Vital Signs Time Taken: 12:59 Temperature (F): 98.1 Height (in): 68 Pulse (bpm): 86 Weight (lbs): 198 Respiratory Rate (breaths/min): 20 Body Mass Index (BMI): 30.1 Blood Pressure (mmHg): 155/86 Reference Range: 80 - 120 mg / dl Electronic Signature(s) Signed: 11/18/2021 5:53:09 PM By: Deon Pilling RN, BSN Entered By: Deon Pilling on 11/17/2021 13:02:21

## 2021-11-19 NOTE — Progress Notes (Signed)
[name] ([id number]) [id number] Page 1 of 8 Visit Report for 11/17/2021 Chief Complaint Document Details Patient Name: Date of Service: [name], Kenneth MES L. 11/17/2021 12:45 PM Medical Record Number: [id number] Patient Account Number: [id number] Date of Birth/Sex: Treating RN: 05-09-1935 (86 y.o. M) Primary Care Provider: Dorthy Cooler, Dibas Other Clinician: Referring Provider: Treating Provider/Extender: Alanda Slim, Dibas Weeks in Treatment: 11 Information Obtained from: Patient Chief Complaint 08/26/2021; right foot wound Electronic Signature(s) Signed: 11/18/2021 6:10:28 PM By: Kalman Shan DO Entered By: Kalman Shan on 11/17/2021 13:36:12 -------------------------------------------------------------------------------- HPI Details Patient Name: Date of Service: Kenneth Mons MES L. 11/17/2021 12:45 PM Medical Record Number: [id number] Patient Account Number: [id number] Date of Birth/Sex: Treating RN: [birth year] (86 y.o. M) Primary Care Provider: Dorthy Cooler, Dibas Other Clinician: Referring Provider: Treating Provider/Extender: Alanda Slim, Dibas Weeks in Treatment: 11 History of Present Illness HPI Description: Admission 08/26/2021 [name] is an 86 year old male with a past medical history of controlled type 2 diabetes on oral agents with peripheral neuropathy and Atrial fibrillation not on blood thinners that presents to the clinic with a right foot wound that has waxed and waned in healing over the past year. He has peripheral neuropathy and is not sure when the wound started or how it started. He had an MRI done by his primary care physician on 08/03/2021 that showed potential early acute osteomyelitis at the posterior calcaneus. He was seen by Dr. Linus Salmons on 08/06/2021 and started on doxycycline and cefadroxil due to these findings. He was evaluated by podiatry, Dr. Jacqualyn Posey and had a bone biopsy done on 08/13/2021 that did  not show evidence of osteomyelitis. He had a culture that showed MRSA sensitive to doxycycline. He also had ABIs with TBI's done on 8/2 that showed an ABI of 0.58 and a noncompressible TBI. He is scheduled to see vein and vascular on 8/30. Currently he is keeping the wound covered. He is using a soft surgical shoe. He denies signs of infection. 8/5; patient presents for follow-up. He had follow-up with Dr. Linus Salmons On 8/23. Plan is for 6 weeks of doxycycline through September 16. Although path was negative for osteo there is still concern due to positive culture from the bone. Unfortunately patient missed his vein and vascular appointment to assess blood flow status. This has been rescheduled for 9/8. Patient has been using Medihoney to the wound bed. He tries to offload the wound bed but has trouble at night keeping pressure off of it. He does not have Prevalon boots. He currently denies signs of infection. 9/19; patient presents for follow-up. He is scheduled to have a bilateral lower extremity angiogram on 9/20. He currently denies signs of infection. He has been using Medihoney to the wound bed. 11/13; patient has missed his last follow-up. In fact he has not been here in 2 months. He had bilateral iliofemoral endarterectomy with retrograde iliac stenting on 10/09/2021. He has been using Medihoney with Hydrofera Blue to the heel wound. He reports improvement in wound healing here. 2 weeks ago he developed a wound to the dorsal aspect of the great toe. He states His shoes cause this issue and he is currently wearing different shoes. He is on clindamycin as there was concern for infection to the right great toe. He currently denies signs of infection. He has been keeping the great toe open to air. Electronic Signature(s) Signed: 11/18/2021 6:10:28 PM By: Kalman Shan DO Entered By: Kalman Shan on 11/17/2021 13:39:17 [name] ([id number]) [id number] Page 2 of  8 -------------------------------------------------------------------------------- Physical Exam Details Patient Name: Date of Service: [name], Kenneth MES L. 11/17/2021 12:45 PM Medical Record Number: [id number] Patient Account Number: [id number] Date of Birth/Sex: Treating RN: 10-18-1935 (86 y.o. M) Primary Care Provider: Dorthy Cooler, Dibas Other Clinician: Referring Provider: Treating Provider/Extender: Alanda Slim, Dibas Weeks in Treatment: 11 Constitutional respirations regular, non-labored and within target range for patient.. Cardiovascular 2+ dorsalis pedis/posterior tibialis pulses. Psychiatric pleasant and cooperative. Notes Right foot: T the calcaneus there is a circular wound with granulation tissue throughout. T the dorsal right great toe there is an open wound with mostly o o granulation tissue and scant nonviable tissue. No surrounding signs of infection. Electronic Signature(s) Signed: 11/18/2021 6:10:28 PM By: Kalman Shan DO Entered By: Kalman Shan on 11/17/2021 14:01:24 -------------------------------------------------------------------------------- Physician Orders Details Patient Name: Date of Service: Kenneth Mons MES L. 11/17/2021 12:45 PM Medical Record Number: [id number] Patient Account Number: [id number] Date of Birth/Sex: Treating RN: Nov 26, 1935 (86 y.o. [name] Primary Care Provider: Dorthy Cooler, Dibas Other Clinician: Referring Provider: Treating Provider/Extender: Alanda Slim, Dibas Weeks in Treatment: 52 Verbal / Phone Orders: No Diagnosis Coding Follow-up Appointments Return Appointment in 2 weeks. Anesthetic (In clinic) Topical Lidocaine 5% applied to wound bed (In clinic) Topical Lidocaine 4% applied to wound bed Cellular or Tissue Based Products Other Cellular or Tissue Based Products Orders/Instructions: - Will run IVR for Organogenesis products. Bathing/ Shower/ Hygiene May shower and wash wound with soap  and water. Edema Control - Lymphedema / SCD / Other Avoid standing for long periods of time. Moisturize legs daily. Off-Loading Open toe surgical shoe to: - Wear socks with your shoes Other: - -Keep pressure off area as much as is possible -Will order [employer] Additional Orders / Instructions NICKOLAS, CHALFIN ([id number]) [id number] Page 3 of 8 Follow Nutritious Diet Wound Treatment Wound #1 - Calcaneus Wound Laterality: Right Cleanser: Soap and Water 1 x Per Day/7 Days Discharge Instructions: May shower and wash wound with dial antibacterial soap and water prior to dressing change. Peri-Wound Care: Skin Prep (Generic) 1 x Per Day/7 Days Discharge Instructions: Use skin prep as directed Prim Dressing: Hydrofera Blue Ready Foam, 2.5 x2.5 in (Generic) 1 x Per Day/7 Days ary Discharge Instructions: Apply to wound bed as instructed Prim Dressing: MediHoney Gel, tube 1.5 (oz) 1 x Per Day/7 Days ary Discharge Instructions: Apply to wound bed as instructed Secondary Dressing: ALLEVYN Gentle Border, 3x3 (in/in) (Dispense As Written) 1 x Per Day/7 Days Discharge Instructions: Apply over primary dressing as directed. Add-Ons: Cotton Tip Swabs (Generic) 1 x Per Day/7 Days Wound #2 - T Great oe Wound Laterality: Right Cleanser: Soap and Water 1 x Per Day/7 Days Discharge Instructions: May shower and wash wound with dial antibacterial soap and water prior to dressing change. Peri-Wound Care: Skin Prep (Generic) 1 x Per Day/7 Days Discharge Instructions: Use skin prep as directed Prim Dressing: Hydrofera Blue Ready Foam, 2.5 x2.5 in (Generic) 1 x Per Day/7 Days ary Discharge Instructions: Apply to wound bed as instructed Prim Dressing: MediHoney Gel, tube 1.5 (oz) 1 x Per Day/7 Days ary Discharge Instructions: Apply to wound bed as instructed Secondary Dressing: ALLEVYN Gentle Border, 3x3 (in/in) (Dispense As Written) 1 x Per Day/7 Days Discharge Instructions:  Apply over primary dressing as directed. Add-Ons: Cotton Tip Swabs (Generic) 1 x Per Day/7 Days Electronic Signature(s) Signed: 11/17/2021 4:10:29 PM By: Rhae Hammock RN Signed: 11/18/2021 6:10:28 PM By: Kalman Shan DO Entered By: Rhae Hammock on 11/17/2021 13:35:57 -------------------------------------------------------------------------------- Problem List  Details Patient Name: Date of Service: [name], Kenneth MES L. 11/17/2021 12:45 PM Medical Record Number: [id number] Patient Account Number: [id number] Date of Birth/Sex: Treating RN: [birth year] (86 y.o. M) Primary Care Provider: Dorthy Cooler, Dibas Other Clinician: Referring Provider: Treating Provider/Extender: Alanda Slim, Dibas Weeks in Treatment: 11 Active Problems ICD-10 Encounter Code Description Active Date MDM Diagnosis E11.621 Type 2 diabetes mellitus with foot ulcer 08/26/2021 No Yes I73.9 Peripheral vascular disease, unspecified 08/26/2021 No Yes SANDON, YOHO ([id number]) [id number] Page 4 of 8 M86.171 Other acute osteomyelitis, right ankle and foot 08/26/2021 No Yes E11.40 Type 2 diabetes mellitus with diabetic neuropathy, unspecified 08/26/2021 No Yes L97.512 Non-pressure chronic ulcer of other part of right foot with fat layer exposed 09/23/2021 No Yes Inactive Problems Resolved Problems Electronic Signature(s) Signed: 11/18/2021 6:10:28 PM By: Kalman Shan DO Entered By: Kalman Shan on 11/17/2021 13:35:37 -------------------------------------------------------------------------------- Progress Note Details Patient Name: Date of Service: Kenneth Mons MES L. 11/17/2021 12:45 PM Medical Record Number: [id number] Patient Account Number: [id number] Date of Birth/Sex: Treating RN: March 15, 1935 (86 y.o. M) Primary Care Provider: Dorthy Cooler, Dibas Other Clinician: Referring Provider: Treating Provider/Extender: Alanda Slim, Dibas Weeks in Treatment:  11 Subjective Chief Complaint Information obtained from Patient 08/26/2021; right foot wound History of Present Illness (HPI) Admission 08/26/2021 Mr. Calum Cormier is an 86 year old male with a past medical history of controlled type 2 diabetes on oral agents with peripheral neuropathy and Atrial fibrillation not on blood thinners that presents to the clinic with a right foot wound that has waxed and waned in healing over the past year. He has peripheral neuropathy and is not sure when the wound started or how it started. He had an MRI done by his primary care physician on 08/03/2021 that showed potential early acute osteomyelitis at the posterior calcaneus. He was seen by Dr. Linus Salmons on 08/06/2021 and started on doxycycline and cefadroxil due to these findings. He was evaluated by podiatry, Dr. Jacqualyn Posey and had a bone biopsy done on 08/13/2021 that did not show evidence of osteomyelitis. He had a culture that showed MRSA sensitive to doxycycline. He also had ABIs with TBI's done on 8/2 that showed an ABI of 0.58 and a noncompressible TBI. He is scheduled to see vein and vascular on 8/30. Currently he is keeping the wound covered. He is using a soft surgical shoe. He denies signs of infection. 8/5; patient presents for follow-up. He had follow-up with Dr. Linus Salmons On 8/23. Plan is for 6 weeks of doxycycline through September 16. Although path was negative for osteo there is still concern due to positive culture from the bone. Unfortunately patient missed his vein and vascular appointment to assess blood flow status. This has been rescheduled for 9/8. Patient has been using Medihoney to the wound bed. He tries to offload the wound bed but has trouble at night keeping pressure off of it. He does not have Prevalon boots. He currently denies signs of infection. 9/19; patient presents for follow-up. He is scheduled to have a bilateral lower extremity angiogram on 9/20. He currently denies signs of infection. He has  been using Medihoney to the wound bed. 11/13; patient has missed his last follow-up. In fact he has not been here in 2 months. He had bilateral iliofemoral endarterectomy with retrograde iliac stenting on 10/09/2021. He has been using Medihoney with Hydrofera Blue to the heel wound. He reports improvement in wound healing here. 2 weeks ago he developed a wound to the dorsal aspect of the great toe. He states  His shoes cause this issue and he is currently wearing different shoes. He is on clindamycin as there was concern for infection to the right great toe. He currently denies signs of infection. He has been keeping the great toe open to air. Patient History Information obtained from Patient, Chart. Family History Diabetes - Child, Stroke - Mother, No family history of Cancer, Heart Disease, Hereditary Spherocytosis, Hypertension, Kidney Disease, Lung Disease, Seizures, Thyroid Problems, Tuberculosis. Social History Former smoker, Marital Status - Divorced, Alcohol Use - Moderate, Drug Use - Prior History, Caffeine Use - Daily. ERVINE, WITUCKI ([id number]) [id number] Page 5 of 8 Medical History Eyes Patient has history of Cataracts - Surgery Cardiovascular Patient has history of Angina - AFib Endocrine Patient has history of Type II Diabetes Musculoskeletal Patient has history of Gout, Osteoarthritis Denies history of Rheumatoid Arthritis, Osteomyelitis Neurologic Patient has history of Neuropathy Hospitalization/Surgery History - inpatient 09/2021 arterial stenting on legs.. Medical A Surgical History Notes nd Cardiovascular Hx CVA Objective Constitutional respirations regular, non-labored and within target range for patient.. Vitals Time Taken: 12:59 PM, Height: 68 in, Weight: 198 lbs, BMI: 30.1, Temperature: 98.1 F, Pulse: 86 bpm, Respiratory Rate: 20 breaths/min, Blood Pressure: 155/86 mmHg. Cardiovascular 2+ dorsalis pedis/posterior tibialis  pulses. Psychiatric pleasant and cooperative. General Notes: Right foot: T the calcaneus there is a circular wound with granulation tissue throughout. T the dorsal right great toe there is an open wound with o o mostly granulation tissue and scant nonviable tissue. No surrounding signs of infection. Integumentary (Hair, Skin) Wound #1 status is Open. Original cause of wound was Gradually Appeared. The date acquired was: 06/05/2021. The wound has been in treatment 11 weeks. The wound is located on the Right Calcaneus. The wound measures 0.7cm length x 0.7cm width x 0.1cm depth; 0.385cm^2 area and 0.038cm^3 volume. There is Fat Layer (Subcutaneous Tissue) exposed. There is no tunneling or undermining noted. There is a medium amount of serosanguineous drainage noted. The wound margin is well defined and not attached to the wound base. There is large (67-100%) red granulation within the wound bed. There is no necrotic tissue within the wound bed. The periwound skin appearance did not exhibit: Callus, Crepitus, Excoriation, Induration, Rash, Scarring, Dry/Scaly, Maceration, Atrophie Blanche, Cyanosis, Ecchymosis, Hemosiderin Staining, Mottled, Pallor, Rubor, Erythema. Wound #2 status is Open. Original cause of wound was Trauma. The date acquired was: 11/10/2021. The wound is located on the Right T Great. The wound oe measures 1cm length x 1cm width x 0.1cm depth; 0.785cm^2 area and 0.079cm^3 volume. There is Fat Layer (Subcutaneous Tissue) exposed. There is no tunneling or undermining noted. There is a medium amount of serosanguineous drainage noted. The wound margin is distinct with the outline attached to the wound base. There is small (1-33%) red, pink granulation within the wound bed. There is a large (67-100%) amount of necrotic tissue within the wound bed including Adherent Slough. The periwound skin appearance did not exhibit: Callus, Crepitus, Excoriation, Induration, Rash, Scarring, Dry/Scaly,  Maceration, Atrophie Blanche, Cyanosis, Ecchymosis, Hemosiderin Staining, Mottled, Pallor, Rubor, Erythema. Assessment Active Problems ICD-10 Type 2 diabetes mellitus with foot ulcer Peripheral vascular disease, unspecified Other acute osteomyelitis, right ankle and foot Type 2 diabetes mellitus with diabetic neuropathy, unspecified Non-pressure chronic ulcer of other part of right foot with fat layer exposed Patient's right heel wound has improved in size and appearance since last clinic visit. He had a PCR culture done at last clinic visit 2 months ago that showed high levels of Pseudomonas aeurogonosa  and Enterococcus bacillus. Keystone antibiotic ointment was ordered however patient did not obtain this. Since the wound has improved greatly, especially after revascularization I recommended just continuing with Medihoney and Hydrofera Blue. He has developed a new wound 2 weeks ago caused by friction from his shoe. I recommended aggressive offloading and using a foam donut pad to this area. He is not using the same shoes that caused this wound. I recommended also Medihoney and Hydrofera Blue here. I do not see signs of infection today. He is on clindamycin. Follow-up in 2 weeks. Plan MACKENZIE, LIA ([id number]) [id number] Page 6 of 8 Follow-up Appointments: Return Appointment in 2 weeks. Anesthetic: (In clinic) Topical Lidocaine 5% applied to wound bed (In clinic) Topical Lidocaine 4% applied to wound bed Cellular or Tissue Based Products: Other Cellular or Tissue Based Products Orders/Instructions: - Will run IVR for [employer] products. Bathing/ Shower/ Hygiene: May shower and wash wound with soap and water. Edema Control - Lymphedema / SCD / Other: Avoid standing for long periods of time. Moisturize legs daily. Off-Loading: Open toe surgical shoe to: - Wear socks with your shoes Other: - -Keep pressure off area as much as is possible -Will order  Prevalon Boot Additional Orders / Instructions: Follow Nutritious Diet WOUND #1: - Calcaneus Wound Laterality: Right Cleanser: Soap and Water 1 x Per Day/7 Days Discharge Instructions: May shower and wash wound with dial antibacterial soap and water prior to dressing change. Peri-Wound Care: Skin Prep (Generic) 1 x Per Day/7 Days Discharge Instructions: Use skin prep as directed Prim Dressing: Hydrofera Blue Ready Foam, 2.5 x2.5 in (Generic) 1 x Per Day/7 Days ary Discharge Instructions: Apply to wound bed as instructed Prim Dressing: MediHoney Gel, tube 1.5 (oz) 1 x Per Day/7 Days ary Discharge Instructions: Apply to wound bed as instructed Secondary Dressing: ALLEVYN Gentle Border, 3x3 (in/in) (Dispense As Written) 1 x Per Day/7 Days Discharge Instructions: Apply over primary dressing as directed. Add-Ons: Cotton Tip Swabs (Generic) 1 x Per Day/7 Days WOUND #2: - T Great Wound Laterality: Right oe Cleanser: Soap and Water 1 x Per Day/7 Days Discharge Instructions: May shower and wash wound with dial antibacterial soap and water prior to dressing change. Peri-Wound Care: Skin Prep (Generic) 1 x Per Day/7 Days Discharge Instructions: Use skin prep as directed Prim Dressing: Hydrofera Blue Ready Foam, 2.5 x2.5 in (Generic) 1 x Per Day/7 Days ary Discharge Instructions: Apply to wound bed as instructed Prim Dressing: MediHoney Gel, tube 1.5 (oz) 1 x Per Day/7 Days ary Discharge Instructions: Apply to wound bed as instructed Secondary Dressing: ALLEVYN Gentle Border, 3x3 (in/in) (Dispense As Written) 1 x Per Day/7 Days Discharge Instructions: Apply over primary dressing as directed. Add-Ons: Cotton Tip Swabs (Generic) 1 x Per Day/7 Days 1. Medihoney and Hydrofera Blue to both wound sites 2. Foam donut to the right great toe wound 3. Aggressive offloading to the right heel 4. Follow-up in 2 weeks 5. Continue antibiotics per Dr. Amedeo Plenty Electronic Signature(s) Signed: 11/18/2021  6:10:28 PM By: Kalman Shan DO Entered By: Kalman Shan on 11/17/2021 14:22:49 -------------------------------------------------------------------------------- HxROS Details Patient Name: Date of Service: Kenneth Mons MES L. 11/17/2021 12:45 PM Medical Record Number: [id number] Patient Account Number: [id number] Date of Birth/Sex: Treating RN: 26-Jul-1935 (86 y.o. [name] Primary Care Provider: Dorthy Cooler, Dibas Other Clinician: Referring Provider: Treating Provider/Extender: Alanda Slim, Dibas Weeks in Treatment: 11 Information Obtained From Patient Chart Eyes Medical History: Positive for: Cataracts - Surgery Cardiovascular JOANDRY, SLAGTER ([id number]) [id number] Page  7 of 8 Medical History: Positive for: Angina - AFib Past Medical History Notes: Hx CVA Endocrine Medical History: Positive for: Type II Diabetes Treated with: Oral agents, Diet Blood sugar tested every day: No Musculoskeletal Medical History: Positive for: Gout; Osteoarthritis Negative for: Rheumatoid Arthritis; Osteomyelitis Neurologic Medical History: Positive for: Neuropathy HBO Extended History Items Eyes: Cataracts Immunizations Pneumococcal Vaccine: Received Pneumococcal Vaccination: No Implantable Devices None Hospitalization / Surgery History Type of Hospitalization/Surgery inpatient 09/2021 arterial stenting on legs. Family and Social History Cancer: No; Diabetes: Yes - Child; Heart Disease: No; Hereditary Spherocytosis: No; Hypertension: No; Kidney Disease: No; Lung Disease: No; Seizures: No; Stroke: Yes - Mother; Thyroid Problems: No; Tuberculosis: No; Former smoker; Marital Status - Divorced; Alcohol Use: Moderate; Drug Use: Prior History; Caffeine Use: Daily; Financial Concerns: No; Food, Clothing or Shelter Needs: No; Support System Lacking: No; Transportation Concerns: No Engineer, maintenance) Signed: 11/18/2021 5:53:09 PM By: Deon Pilling RN, BSN Signed: 11/18/2021 6:10:28 PM By: Kalman Shan DO Previous Signature: 11/17/2021 1:13:56 PM Version By: Kalman Shan DO Entered By: Kalman Shan on 11/17/2021 13:39:27 -------------------------------------------------------------------------------- SuperBill Details Patient Name: Date of Service: Kenneth Mons MES L. 11/17/2021 Medical Record Number: [id number] Patient Account Number: [id number] Date of Birth/Sex: Treating RN: 01-22-1935 (86 y.o. [name] Primary Care Provider: Dorthy Cooler, Dibas Other Clinician: Referring Provider: Treating Provider/Extender: Alanda Slim, Dibas Weeks in Treatment: 11 Diagnosis Coding ICD-10 Codes Code Description E11.621 Type 2 diabetes mellitus with foot ulcer I73.9 Peripheral vascular disease, unspecified [name], [name] ([id number]) [id number] Page 8 of 8 M86.171 Other acute osteomyelitis, right ankle and foot E11.40 Type 2 diabetes mellitus with diabetic neuropathy, unspecified L97.512 Non-pressure chronic ulcer of other part of right foot with fat layer exposed Facility Procedures : CPT4 Code: [id number] Description: 99214 - WOUND CARE VISIT-LEV 4 EST PT Modifier: Quantity: 1 Physician Procedures : CPT4 Code Description Modifier [id number] - WC PHYS LEVEL 4 - EST PT ICD-10 Diagnosis Description E11.621 Type 2 diabetes mellitus with foot ulcer I73.9 Peripheral vascular disease, unspecified E11.40 Type 2 diabetes mellitus with diabetic  neuropathy, unspecified L97.512 Non-pressure chronic ulcer of other part of right foot with fat layer exposed Quantity: 1 Electronic Signature(s) Signed: 11/18/2021 6:10:28 PM By: Kalman Shan DO Entered By: Kalman Shan on 11/17/2021 14:23:12

## 2021-11-20 ENCOUNTER — Ambulatory Visit: Payer: Medicare Other

## 2021-11-20 ENCOUNTER — Ambulatory Visit: Payer: Medicare Other | Admitting: Podiatry

## 2021-11-20 DIAGNOSIS — L97514 Non-pressure chronic ulcer of other part of right foot with necrosis of bone: Secondary | ICD-10-CM | POA: Diagnosis not present

## 2021-11-20 DIAGNOSIS — M86071 Acute hematogenous osteomyelitis, right ankle and foot: Secondary | ICD-10-CM

## 2021-11-20 DIAGNOSIS — L97412 Non-pressure chronic ulcer of right heel and midfoot with fat layer exposed: Secondary | ICD-10-CM

## 2021-11-20 DIAGNOSIS — R234 Changes in skin texture: Secondary | ICD-10-CM

## 2021-11-20 NOTE — Progress Notes (Signed)
Subjective:  Chief Complaint  Patient presents with   Foot Ulcer    Patient came in today for bilateral heel ulcers, and right hallux ulcer, left heel started last week, patient is having pain when anything touches the heels, X-Rays done today, Nail trim    87 year old male presents the above concerns.  Since I saw him last he has developed a wound on the right big toe which she states that he developed after he wore shoes that rubbed.  He saw Dr. Heber [place] at the wound care center on 11/13 and he has a follow up scheduled for 12/01/2021  He was last seen by vascular on 10/23  States that he saw infectious disease and had a virtual appointment recently but I do not see a recent visit.  He is still on doxycycline  Denies any systemic complaints such as fevers, chills, nausea, vomiting.   Objective: AAO x3, NAD Full-thickness ulceration noted on the dorsal aspect of right hallux measuring 1 x 1 cm and there is probing to bone noted with localized edema and erythema.  There is no purulence noted.  There is no fluctuation or crepitation.  There is no malodor.  Along the right heel there is an ulceration measuring 0.5 x 0.5 cm with granular base.  No probing, and or tunneling.  Skin fissure present on the right heel.  See pictures below. No pain with calf compression, swelling, warmth, erythema         Assessment: Ulceration right heel with ulceration right hallux with likely osteomyelitis; left heel fissure  Plan: -All treatment options discussed with the patient including all alternatives, risks, complications.  -X-rays were obtained and reviewed.  The definitive cortical changes's of the hallux today.  There is no soft tissue edema.  There is chronic changes present of the first MPJ.  Calcifications on the Achilles tendon. -I do feel that there is infection, osteomyelitis of the right hallux skin that probes to bone.  I discussed with him high risk of amputation of this toe.  He is  continue antibiotics.  Continue to follow-up with the wound care center for now.  Will discuss with vascular surgery as well as infectious disease. -Offloading -Monitor for any clinical signs or symptoms of infection and directed to call the office immediately should any occur or go to the ER. -Patient encouraged to call the office with any questions, concerns, change in symptoms.    35 minutes were spent with the patient.  Trula Slade DPM

## 2021-11-21 ENCOUNTER — Telehealth: Payer: Self-pay

## 2021-11-21 ENCOUNTER — Ambulatory Visit: Payer: Medicare Other

## 2021-11-21 ENCOUNTER — Ambulatory Visit (HOSPITAL_COMMUNITY): Payer: Medicare Other

## 2021-11-21 NOTE — Progress Notes (Deleted)
[facility] HISTORY AND PHYSICAL   History of Present Illness:  Patient is a 86 y.o. year old male who presents for evaluation of PAD with history of  bilateral lower extremity critical limb ischemia, tissue loss on the right foot, currently being managed by podiatry.  Recent angiography demonstrated severe inflow disease which would be best treated with bilateral iliofemoral endarterectomies, retrograde stenting.  He is now s/p  Bilateral lower extremity iliofemoral endarterectomies with bovine patch plasty Bilateral common iliac artery stenting 9 x 29 mm gore VBX stents by Dr. Virl Cagey on 10/09/21.  Prior to discharge the right heel ulcer is healing per DPM note.  Dressing changes with Medihoney ordered yesterday by Dr. Sherryle Lis.  F/U OP with wound care.  He was discharged on ASA, Plavix and a Statin daily.      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) 05/18/2014   Stenosis of right carotid artery    40-59 percent    Streptococcal bacteremia 05/09/2014    Past Surgical History:  Procedure Laterality Date   ABDOMINAL AORTOGRAM W/LOWER EXTREMITY N/A 09/24/2021   Procedure: ABDOMINAL AORTOGRAM W/LOWER EXTREMITY;  Surgeon: [name], MD;  Location: [facility];  Service: Cardiovascular;  Laterality: N/A;   AORTOGRAM  10/09/2021   Procedure: AORTOGRAM;  Surgeon: [name], MD;  Location: [facility];  Service: Vascular;;   APPLICATION OF WOUND VAC Bilateral 10/09/2021   Procedure:  APPLICATION OF WOUND VAC;  Surgeon: [name], MD;  Location: [facility];  Service: Vascular;  Laterality: Bilateral;   BONE BIOPSY Right 08/13/2021   Procedure: BONE BIOPSY;  Surgeon: Trula Slade, DPM;  Location: [facility];  Service: Podiatry;  Laterality: Right;   ENDARTERECTOMY Bilateral 10/09/2021   Procedure: ILIOFEMORAL ENDARTERECTOMY;  Surgeon: [name], MD;  Location: [facility];  Service: Vascular;  Laterality: Bilateral;   HIP ARTHROPLASTY Right    INSERTION OF ILIAC STENT Bilateral 10/09/2021   Procedure: INSERTION OF RETROGRADE ILIAC STENT;  Surgeon: [name], MD;  Location: [facility];  Service: Vascular;  Laterality: Bilateral;   JOINT REPLACEMENT     right hip, bilateral knees   left femur surgery Left    with rod   right cataract extraction     TONSILLECTOMY     TOTAL SHOULDER ARTHROPLASTY Bilateral    ULTRASOUND GUIDANCE FOR VASCULAR ACCESS Bilateral 10/09/2021   Procedure: ULTRASOUND GUIDANCE FOR VASCULAR ACCESS;  Surgeon: [name], MD;  Location: [facility];  Service: Vascular;  Laterality: Bilateral;   WOUND DEBRIDEMENT Right 08/13/2021   Procedure: DEBRIDEMENT WOUND;  Surgeon: Trula Slade, DPM;  Location: [facility];  Service: Podiatry;  Laterality: Right;    ROS:   General:  No weight loss, Fever, chills  HEENT: No recent headaches, no nasal bleeding, no visual changes, no sore throat  Neurologic: No dizziness, blackouts, seizures. No recent symptoms of stroke or mini- stroke. No recent episodes of slurred speech, or temporary  blindness.  Cardiac: No recent episodes of chest pain/pressure, no shortness of breath at rest.  No shortness of breath with exertion.  Denies history of atrial fibrillation or irregular heartbeat  Vascular: No history of rest pain in feet.  No history of claudication.  No history of non-healing ulcer, No history of DVT   Pulmonary: No home oxygen, no productive cough, no hemoptysis,  No asthma or wheezing  Musculoskeletal:  [  ] Arthritis, '[ ]'$  Low back pain,  '[ ]'$  Joint pain  Hematologic:No history of hypercoagulable state.  No history of easy bleeding.  No history of anemia  Gastrointestinal: No hematochezia or melena,  No gastroesophageal reflux, no trouble swallowing  Urinary: '[ ]'$  chronic Kidney disease, '[ ]'$  on HD - '[ ]'$  MWF or '[ ]'$  TTHS, '[ ]'$  Burning with urination, '[ ]'$  Frequent urination, '[ ]'$  Difficulty urinating;   Skin: No rashes  Psychological: No history of anxiety,  No history of depression  Social History Social History   Tobacco Use   Smoking status: Former    Packs/day: 25.00    Types: Cigarettes   Smokeless tobacco: Never  Vaping Use   Vaping Use: Never used  Substance Use Topics   Alcohol use: Yes    Comment: 4 beers daily   Drug use: Not Currently    Family History Family History  Problem Relation Age of Onset   Diabetes Mellitus II Daughter     Allergies  Allergies  Allergen Reactions   Allopurinol Other (See Comments)    Makes head 'feel funny'   Atorvastatin Other (See Comments)    Unknown reaction    Motrin [Ibuprofen] Other (See Comments)    Blisters in groin area     Current Outpatient Medications  Medication Sig Dispense Refill   amoxicillin (AMOXIL) 500 MG capsule TAKE 1 CAPSULE BY MOUTH TWICE A DAY (Patient not taking: Reported on 10/07/2021) 60 capsule 2   APPLE CIDER VINEGAR PO Take 1 capsule by mouth daily.     aspirin EC 81 MG tablet Take 81 mg by mouth every other day. Swallow whole.     atenolol (TENORMIN) 50 MG tablet Take 50 mg by mouth daily.     cefadroxil (DURICEF) 500 MG capsule Take 1 capsule (500 mg total) by mouth 2 (two) times daily. (Patient not taking: Reported on 09/29/2021) 60 capsule 1   clopidogrel (PLAVIX) 75 MG tablet TAKE 1 TABLET (75 MG TOTAL) BY MOUTH DAILY AT 6 (SIX) AM. 90 tablet 1   doxycycline (VIBRA-TABS) 100 MG tablet Take 1 tablet (100 mg total) by mouth 2 (two) times daily. 60 tablet 1   fluticasone (FLONASE) 50 MCG/ACT nasal  spray Place 2 sprays into both nostrils daily.     furosemide (LASIX) 20 MG tablet Take 20 mg by mouth daily.     hydrALAZINE (APRESOLINE) 25 MG tablet Take 1 tablet (25 mg total) by mouth every 8 (eight) hours. (Patient taking differently: Take 25 mg by mouth 2 (two) times daily.)     irbesartan (AVAPRO) 75 MG tablet Take 75 mg by mouth daily.     LYRICA 75 MG capsule Take 75-150 mg by mouth See admin instructions. Take 75 mg by mouth in the morning and 150 mg at night     meloxicam (MOBIC) 7.5 MG tablet Take 7.5 mg by mouth daily.     metFORMIN (GLUCOPHAGE) 1000 MG tablet Take 1,000 mg by mouth in the morning and at bedtime.     Multiple Vitamin (  MULTIVITAMIN WITH MINERALS) TABS tablet Take 1 tablet by mouth daily.     neomycin-bacitracin-polymyxin (NEOSPORIN) [phone] ointment Apply 1 Application topically daily as needed (wound care).     OVER THE COUNTER MEDICATION Take 1 capsule by mouth daily. Balance of Nature Fruits     OVER THE COUNTER MEDICATION Take 1 tablet by mouth daily. Beet supplement     protein supplement shake (PREMIER PROTEIN) LIQD Take 11 oz by mouth daily.     rosuvastatin (CRESTOR) 20 MG tablet Take 1 tablet (20 mg total) by mouth daily. 90 tablet 3   Tetrahydrozoline HCl (VISINE OP) Place 1 drop into both eyes daily as needed (burning).     traMADol (ULTRAM) 50 MG tablet Take 2 tablets (100 mg total) by mouth every 6 (six) hours as needed. Every 4 hours 30 tablet 0   ULORIC 80 MG TABS Take 80 mg by mouth daily.  3   No current facility-administered medications for this visit.    Physical Examination  There were no vitals filed for this visit.  There is no height or weight on file to calculate BMI.  General:  Alert and oriented, no acute distress HEENT: Normal Neck: No bruit or JVD Pulmonary: Clear to auscultation bilaterally Cardiac: Regular Rate and Rhythm without murmur Abdomen: Soft, non-tender, non-distended, no mass, no scars Skin: No rash Extremity  Pulses:  2+ radial, brachial, femoral, dorsalis pedis, posterior tibial pulses bilaterally Musculoskeletal: No deformity or edema  Neurologic: Upper and lower extremity motor 5/5 and symmetric  DATA: ***   ASSESSMENT: ***   PLAN: ***   Ruta Hinds, MD [facility] Office: [phone] Pager: [phone]

## 2021-11-21 NOTE — Telephone Encounter (Signed)
[name] with [facility] called to report that the pt did not answer the Hood for his visit today, so he did not receive wound care.

## 2021-11-22 NOTE — Telephone Encounter (Signed)
I saw the patient on 11/16 and he needed a 2 week follow up for x-rays, ulcer. I don't see it scheduled. Can someone please contact him? Thanks.

## 2021-11-25 DIAGNOSIS — I70223 Atherosclerosis of native arteries of extremities with rest pain, bilateral legs: Secondary | ICD-10-CM | POA: Diagnosis not present

## 2021-11-25 DIAGNOSIS — M109 Gout, unspecified: Secondary | ICD-10-CM | POA: Diagnosis not present

## 2021-11-25 DIAGNOSIS — E1140 Type 2 diabetes mellitus with diabetic neuropathy, unspecified: Secondary | ICD-10-CM | POA: Diagnosis not present

## 2021-11-25 DIAGNOSIS — I482 Chronic atrial fibrillation, unspecified: Secondary | ICD-10-CM | POA: Diagnosis not present

## 2021-11-25 DIAGNOSIS — E11621 Type 2 diabetes mellitus with foot ulcer: Secondary | ICD-10-CM | POA: Diagnosis not present

## 2021-11-25 DIAGNOSIS — M25551 Pain in right hip: Secondary | ICD-10-CM | POA: Diagnosis not present

## 2021-11-25 DIAGNOSIS — Z48812 Encounter for surgical aftercare following surgery on the circulatory system: Secondary | ICD-10-CM | POA: Diagnosis not present

## 2021-11-25 DIAGNOSIS — L97412 Non-pressure chronic ulcer of right heel and midfoot with fat layer exposed: Secondary | ICD-10-CM | POA: Diagnosis not present

## 2021-11-25 DIAGNOSIS — G894 Chronic pain syndrome: Secondary | ICD-10-CM | POA: Diagnosis not present

## 2021-12-01 ENCOUNTER — Encounter (HOSPITAL_BASED_OUTPATIENT_CLINIC_OR_DEPARTMENT_OTHER): Payer: Medicare Other | Admitting: Internal Medicine

## 2021-12-01 DIAGNOSIS — I70223 Atherosclerosis of native arteries of extremities with rest pain, bilateral legs: Secondary | ICD-10-CM | POA: Diagnosis not present

## 2021-12-01 DIAGNOSIS — L97412 Non-pressure chronic ulcer of right heel and midfoot with fat layer exposed: Secondary | ICD-10-CM | POA: Diagnosis not present

## 2021-12-01 DIAGNOSIS — E1140 Type 2 diabetes mellitus with diabetic neuropathy, unspecified: Secondary | ICD-10-CM | POA: Diagnosis not present

## 2021-12-01 DIAGNOSIS — I482 Chronic atrial fibrillation, unspecified: Secondary | ICD-10-CM | POA: Diagnosis not present

## 2021-12-01 DIAGNOSIS — E11621 Type 2 diabetes mellitus with foot ulcer: Secondary | ICD-10-CM | POA: Diagnosis not present

## 2021-12-01 DIAGNOSIS — M25551 Pain in right hip: Secondary | ICD-10-CM | POA: Diagnosis not present

## 2021-12-01 DIAGNOSIS — Z48812 Encounter for surgical aftercare following surgery on the circulatory system: Secondary | ICD-10-CM | POA: Diagnosis not present

## 2021-12-01 DIAGNOSIS — G894 Chronic pain syndrome: Secondary | ICD-10-CM | POA: Diagnosis not present

## 2021-12-01 DIAGNOSIS — M109 Gout, unspecified: Secondary | ICD-10-CM | POA: Diagnosis not present

## 2021-12-04 ENCOUNTER — Ambulatory Visit: Payer: Medicare Other | Admitting: Podiatry

## 2021-12-04 ENCOUNTER — Telehealth: Payer: Self-pay | Admitting: Podiatry

## 2021-12-04 NOTE — Telephone Encounter (Signed)
Pt was scheduled to see you today but is not sleeping well and when the daughter went to check on him he was sleeping. He asked to reschedule the appt. Next available was 12.11.2023 and I have rescheduled them. They are seeing wound care 12.4.2023 to follow up as well. I did add to waitlist as well for you.

## 2021-12-08 ENCOUNTER — Encounter (HOSPITAL_BASED_OUTPATIENT_CLINIC_OR_DEPARTMENT_OTHER): Payer: Medicare Other | Attending: Internal Medicine | Admitting: Internal Medicine

## 2021-12-08 DIAGNOSIS — M86171 Other acute osteomyelitis, right ankle and foot: Secondary | ICD-10-CM | POA: Diagnosis not present

## 2021-12-08 DIAGNOSIS — E11621 Type 2 diabetes mellitus with foot ulcer: Secondary | ICD-10-CM | POA: Diagnosis not present

## 2021-12-08 DIAGNOSIS — E1169 Type 2 diabetes mellitus with other specified complication: Secondary | ICD-10-CM | POA: Insufficient documentation

## 2021-12-08 DIAGNOSIS — I70223 Atherosclerosis of native arteries of extremities with rest pain, bilateral legs: Secondary | ICD-10-CM | POA: Diagnosis not present

## 2021-12-08 DIAGNOSIS — M25551 Pain in right hip: Secondary | ICD-10-CM | POA: Diagnosis not present

## 2021-12-08 DIAGNOSIS — I739 Peripheral vascular disease, unspecified: Secondary | ICD-10-CM | POA: Diagnosis not present

## 2021-12-08 DIAGNOSIS — I4891 Unspecified atrial fibrillation: Secondary | ICD-10-CM | POA: Diagnosis not present

## 2021-12-08 DIAGNOSIS — Z833 Family history of diabetes mellitus: Secondary | ICD-10-CM | POA: Insufficient documentation

## 2021-12-08 DIAGNOSIS — G894 Chronic pain syndrome: Secondary | ICD-10-CM | POA: Diagnosis not present

## 2021-12-08 DIAGNOSIS — E1142 Type 2 diabetes mellitus with diabetic polyneuropathy: Secondary | ICD-10-CM | POA: Insufficient documentation

## 2021-12-08 DIAGNOSIS — Z48812 Encounter for surgical aftercare following surgery on the circulatory system: Secondary | ICD-10-CM | POA: Diagnosis not present

## 2021-12-08 DIAGNOSIS — L97512 Non-pressure chronic ulcer of other part of right foot with fat layer exposed: Secondary | ICD-10-CM

## 2021-12-08 DIAGNOSIS — Z87891 Personal history of nicotine dependence: Secondary | ICD-10-CM | POA: Insufficient documentation

## 2021-12-08 DIAGNOSIS — E1151 Type 2 diabetes mellitus with diabetic peripheral angiopathy without gangrene: Secondary | ICD-10-CM | POA: Diagnosis not present

## 2021-12-08 DIAGNOSIS — L97514 Non-pressure chronic ulcer of other part of right foot with necrosis of bone: Secondary | ICD-10-CM | POA: Insufficient documentation

## 2021-12-08 DIAGNOSIS — L97412 Non-pressure chronic ulcer of right heel and midfoot with fat layer exposed: Secondary | ICD-10-CM | POA: Diagnosis not present

## 2021-12-08 DIAGNOSIS — M109 Gout, unspecified: Secondary | ICD-10-CM | POA: Diagnosis not present

## 2021-12-08 DIAGNOSIS — E1140 Type 2 diabetes mellitus with diabetic neuropathy, unspecified: Secondary | ICD-10-CM | POA: Diagnosis not present

## 2021-12-08 DIAGNOSIS — I482 Chronic atrial fibrillation, unspecified: Secondary | ICD-10-CM | POA: Diagnosis not present

## 2021-12-08 NOTE — Progress Notes (Signed)
[name] ([id number]) [id number] Page 1 of 9 Visit Report for 12/08/2021 Chief Complaint Document Details Patient Name: Date of Service: [name], [name] MES L. 12/08/2021 1:30 PM Medical Record Number: [id number] Patient Account Number: [id number] Date of Birth/Sex: Treating RN: 17-Oct-1935 (86 y.o. M) Primary Care Provider: Dorthy Cooler, Dibas Other Clinician: Referring Provider: Treating Provider/Extender: Alanda Slim, D[place] Weeks in Treatment: 14 Information Obtained from: Patient Chief Complaint 08/26/2021; right foot wound Electronic Signature(s) Signed: 12/08/2021 3:20:41 PM By: Kalman Shan DO Entered By: Kalman Shan on 12/08/2021 14:22:22 -------------------------------------------------------------------------------- Debridement Details Patient Name: Date of Service: [name] MES L. 12/08/2021 1:30 PM Medical Record Number: [id number] Patient Account Number: [id number] Date of Birth/Sex: Treating RN: 01-17-1935 (86 y.o. [name], Lauren Primary Care Provider: Dorthy Cooler, Dibas Other Clinician: Referring Provider: Treating Provider/Extender: Alanda Slim, Dibas Weeks in Treatment: 14 Debridement Performed for Assessment: Wound #3 Right,Anterior Ankle Performed By: Physician Kalman Shan, DO Debridement Type: Debridement Severity of Tissue Pre Debridement: Fat layer exposed Level of Consciousness (Pre-procedure): Awake and Alert Pre-procedure Verification/Time Out Yes - 13:49 Taken: Start Time: 13:49 Pain Control: Lidocaine T Area Debrided (L x W): otal 1.7 (cm) x 0.8 (cm) = 1.36 (cm) Tissue and other material debrided: Viable, Non-Viable, Slough, Subcutaneous, Slough Level: Skin/Subcutaneous Tissue Debridement Description: Excisional Instrument: Curette Bleeding: Minimum Hemostasis Achieved: Pressure End Time: 13:49 Procedural Pain: 0 Post Procedural Pain: 0 Response to Treatment: Procedure was tolerated  well Level of Consciousness (Post- Awake and Alert procedure): Post Debridement Measurements of Total Wound Length: (cm) 1.7 Width: (cm) 0.8 Depth: (cm) 0.1 Volume: (cm) 0.107 Character of Wound/Ulcer Post Debridement: Improved Severity of Tissue Post Debridement: Fat layer exposed [name], [name] ([id number]) [id number] Page 2 of 9 Post Procedure Diagnosis Same as Pre-procedure Electronic Signature(s) Signed: 12/08/2021 3:20:41 PM By: Kalman Shan DO Signed: 12/08/2021 3:46:46 PM By: Rhae Hammock RN Entered By: Rhae Hammock on 12/08/2021 14:07:45 -------------------------------------------------------------------------------- HPI Details Patient Name: Date of Service: [name] MES L. 12/08/2021 1:30 PM Medical Record Number: [id number] Patient Account Number: [id number] Date of Birth/Sex: Treating RN: [birth year] (86 y.o. M) Primary Care Provider: Dorthy Cooler, Dibas Other Clinician: Referring Provider: Treating Provider/Extender: Alanda Slim, Dibas Weeks in Treatment: 14 History of Present Illness HPI Description: Admission 08/26/2021 [name] is an 86 year old male with a past medical history of controlled type 2 diabetes on oral agents with peripheral neuropathy and Atrial fibrillation not on blood thinners that presents to the clinic with a right foot wound that has waxed and waned in healing over the past year. He has peripheral neuropathy and is not sure when the wound started or how it started. He had an MRI done by his primary care physician on 08/03/2021 that showed potential early acute osteomyelitis at the posterior calcaneus. He was seen by Dr. Linus Salmons on 08/06/2021 and started on doxycycline and cefadroxil due to these findings. He was evaluated by podiatry, Dr. Jacqualyn Posey and had a bone biopsy done on 08/13/2021 that did not show evidence of osteomyelitis. He had a culture that showed MRSA sensitive to doxycycline. He also had  ABIs with TBI's done on 8/2 that showed an ABI of 0.58 and a noncompressible TBI. He is scheduled to see vein and vascular on 8/30. Currently he is keeping the wound covered. He is using a soft surgical shoe. He denies signs of infection. 8/5; patient presents for follow-up. He had follow-up with Dr. Linus Salmons On 8/23. Plan is for 6 weeks of doxycycline through September 16. Although path was negative  for osteo there is still concern due to positive culture from the bone. Unfortunately patient missed his vein and vascular appointment to assess blood flow status. This has been rescheduled for 9/8. Patient has been using Medihoney to the wound bed. He tries to offload the wound bed but has trouble at night keeping pressure off of it. He does not have Prevalon boots. He currently denies signs of infection. 9/19; patient presents for follow-up. He is scheduled to have a bilateral lower extremity angiogram on 9/20. He currently denies signs of infection. He has been using Medihoney to the wound bed. 11/13; patient has missed his last follow-up. In fact he has not been here in 2 months. He had bilateral iliofemoral endarterectomy with retrograde iliac stenting on 10/09/2021. He has been using Medihoney with Hydrofera Blue to the heel wound. He reports improvement in wound healing here. 2 weeks ago he developed a wound to the dorsal aspect of the great toe. He states His shoes cause this issue and he is currently wearing different shoes. He is on clindamycin as there was concern for infection to the right great toe. He currently denies signs of infection. He has been keeping the great toe open to air. 12/4; patient presents for follow-up. He has been using Medihoney and Hydrofera Blue to the heel wound and right great toe wound. The heel wound is closed. The right great toe wound has bone exposed. He now has a new wound to the right ankle that he states he used to shoehorn to scratch the area and created  the wound. He has not been dressing this area. Electronic Signature(s) Signed: 12/08/2021 3:20:41 PM By: Kalman Shan DO Entered By: Kalman Shan on 12/08/2021 14:23:50 -------------------------------------------------------------------------------- Physical Exam Details Patient Name: Date of Service: [name] MES L. 12/08/2021 1:30 PM Medical Record Number: [id number] Patient Account Number: [id number] Date of Birth/Sex: Treating RN: Jun 26, 1935 (86 y.o. M) Primary Care Provider: Dorthy Cooler, Dibas Other Clinician: Referring Provider: Treating Provider/Extender: Alanda Slim, Dibas Weeks in Treatment: [address] REYCE, LUBECK ([id number]) [id number] Page 3 of 9 respirations regular, non-labored and within target range for patient.. Cardiovascular 2+ dorsalis pedis/posterior tibialis pulses. Psychiatric pleasant and cooperative. Notes Right foot: T the calcaneus there is epithelization to the previous wound site. T the dorsal aspect of the right great toe there is an open wound with granulation o o tissue and bone exposed. T the anterior ankle crease there is an open wound with nonviable tissue throughout. There is increased swelling and redness to the o right great toe. Electronic Signature(s) Signed: 12/08/2021 3:20:41 PM By: Kalman Shan DO Entered By: Kalman Shan on 12/08/2021 14:24:43 -------------------------------------------------------------------------------- Physician Orders Details Patient Name: Date of Service: [name] MES L. 12/08/2021 1:30 PM Medical Record Number: [id number] Patient Account Number: [id number] Date of Birth/Sex: Treating RN: 10/22/1935 (86 y.o. [name] Primary Care Provider: Dorthy Cooler, Dibas Other Clinician: Referring Provider: Treating Provider/Extender: Alanda Slim, Dibas Weeks in Treatment: 84 Verbal / Phone Orders: No Diagnosis Coding Follow-up  Appointments ppointment in 1 week. - w/ Dr. Heber [place] ( any day is fine except Wednesday) Return A Anesthetic (In clinic) Topical Lidocaine 5% applied to wound bed (In clinic) Topical Lidocaine 4% applied to wound bed Cellular or Tissue Based Products Other Cellular or Tissue Based Products Orders/Instructions: - Will run IVR for Organogenesis products. Bathing/ Shower/ Hygiene May shower and wash wound with soap and water. Edema Control - Lymphedema / SCD / Other Avoid standing for long periods of time.  Moisturize legs daily. Off-Loading Open toe surgical shoe to: - Wear socks with your shoes Other: - -Keep pressure off area as much as is possible -Will order Prevalon Boot Additional Orders / Instructions Follow Nutritious Diet Wound Treatment Wound #2 - T Great oe Wound Laterality: Right Cleanser: Soap and Water 1 x Per Day/7 Days Discharge Instructions: May shower and wash wound with dial antibacterial soap and water prior to dressing change. Peri-Wound Care: Skin Prep (Generic) 1 x Per Day/7 Days Discharge Instructions: Use skin prep as directed Prim Dressing: MediHoney Gel, tube 1.5 (oz) 1 x Per Day/7 Days ary Discharge Instructions: Apply to wound bed as instructed Prim Dressing: Hydrofera Blue Ready Foam, 2.5 x2.5 in (Generic) 1 x Per Day/7 Days ary Discharge Instructions: Apply to wound bed as instructed Secondary Dressing: ALLEVYN Gentle Border, 3x3 (in/in) (Dispense As Written) 1 x Per Day/7 Days [name], [name] ([id number]) [id number] Page 4 of 9 Discharge Instructions: Apply over primary dressing as directed. Compression Wrap: tubi grip size E 1 x Per Day/7 Days Add-Ons: Cotton Tip Swabs (Generic) 1 x Per Day/7 Days Wound #3 - Ankle Wound Laterality: Right, Anterior Cleanser: Soap and Water 1 x Per Day/7 Days Discharge Instructions: May shower and wash wound with dial antibacterial soap and water prior to dressing change. Peri-Wound Care:  Skin Prep (Generic) 1 x Per Day/7 Days Discharge Instructions: Use skin prep as directed Prim Dressing: MediHoney Gel, tube 1.5 (oz) 1 x Per Day/7 Days ary Discharge Instructions: Apply to wound bed as instructed Prim Dressing: Hydrofera Blue Ready Foam, 2.5 x2.5 in (Generic) 1 x Per Day/7 Days ary Discharge Instructions: Apply to wound bed as instructed Secondary Dressing: ALLEVYN Gentle Border, 3x3 (in/in) (Dispense As Written) 1 x Per Day/7 Days Discharge Instructions: Apply over primary dressing as directed. Compression Wrap: tubi grip size E 1 x Per Day/7 Days Add-Ons: Cotton Tip Swabs (Generic) 1 x Per Day/7 Days Patient Medications llergies: allopurinol, atorvastatin, ibuprofen A Notifications Medication Indication Start End 12/08/2021 doxycycline hyclate DOSE 1 - oral 100 mg tablet - 1 tablet oral twice a day x 14 days Electronic Signature(s) Signed: 12/08/2021 3:20:41 PM By: Kalman Shan DO Previous Signature: 12/08/2021 2:11:58 PM Version By: Kalman Shan DO Entered By: Kalman Shan on 12/08/2021 14:24:56 -------------------------------------------------------------------------------- Problem List Details Patient Name: Date of Service: [name] MES L. 12/08/2021 1:30 PM Medical Record Number: [id number] Patient Account Number: [id number] Date of Birth/Sex: Treating RN: Jun 30, 1935 (86 y.o. M) Primary Care Provider: Dorthy Cooler, Dibas Other Clinician: Referring Provider: Treating Provider/Extender: Alanda Slim, Dibas Weeks in Treatment: 14 Active Problems ICD-10 Encounter Code Description Active Date MDM Diagnosis E11.621 Type 2 diabetes mellitus with foot ulcer 08/26/2021 No Yes I73.9 Peripheral vascular disease, unspecified 08/26/2021 No Yes M86.171 Other acute osteomyelitis, right ankle and foot 08/26/2021 No Yes [name], [name] ([id number]) [id number] Page 5 of 9 E11.40 Type 2 diabetes mellitus with diabetic neuropathy,  unspecified 08/26/2021 No Yes L97.514 Non-pressure chronic ulcer of other part of right foot with necrosis of bone 12/08/2021 No Yes Inactive Problems Resolved Problems Electronic Signature(s) Signed: 12/08/2021 3:20:41 PM By: Kalman Shan DO Entered By: Kalman Shan on 12/08/2021 14:21:53 -------------------------------------------------------------------------------- Progress Note Details Patient Name: Date of Service: [name] MES L. 12/08/2021 1:30 PM Medical Record Number: [id number] Patient Account Number: [id number] Date of Birth/Sex: Treating RN: 1935/12/14 (86 y.o. M) Primary Care Provider: Dorthy Cooler, Dibas Other Clinician: Referring Provider: Treating Provider/Extender: Alanda Slim, Dibas Weeks in Treatment: 14 Subjective Chief Complaint Information obtained from Patient  08/26/2021; right foot wound History of Present Illness (HPI) Admission 08/26/2021 [name] is an 86 year old male with a past medical history of controlled type 2 diabetes on oral agents with peripheral neuropathy and Atrial fibrillation not on blood thinners that presents to the clinic with a right foot wound that has waxed and waned in healing over the past year. He has peripheral neuropathy and is not sure when the wound started or how it started. He had an MRI done by his primary care physician on 08/03/2021 that showed potential early acute osteomyelitis at the posterior calcaneus. He was seen by Dr. Linus Salmons on 08/06/2021 and started on doxycycline and cefadroxil due to these findings. He was evaluated by podiatry, Dr. Jacqualyn Posey and had a bone biopsy done on 08/13/2021 that did not show evidence of osteomyelitis. He had a culture that showed MRSA sensitive to doxycycline. He also had ABIs with TBI's done on 8/2 that showed an ABI of 0.58 and a noncompressible TBI. He is scheduled to see vein and vascular on 8/30. Currently he is keeping the wound covered. He is using a soft surgical shoe. He  denies signs of infection. 8/5; patient presents for follow-up. He had follow-up with Dr. Linus Salmons On 8/23. Plan is for 6 weeks of doxycycline through September 16. Although path was negative for osteo there is still concern due to positive culture from the bone. Unfortunately patient missed his vein and vascular appointment to assess blood flow status. This has been rescheduled for 9/8. Patient has been using Medihoney to the wound bed. He tries to offload the wound bed but has trouble at night keeping pressure off of it. He does not have Prevalon boots. He currently denies signs of infection. 9/19; patient presents for follow-up. He is scheduled to have a bilateral lower extremity angiogram on 9/20. He currently denies signs of infection. He has been using Medihoney to the wound bed. 11/13; patient has missed his last follow-up. In fact he has not been here in 2 months. He had bilateral iliofemoral endarterectomy with retrograde iliac stenting on 10/09/2021. He has been using Medihoney with Hydrofera Blue to the heel wound. He reports improvement in wound healing here. 2 weeks ago he developed a wound to the dorsal aspect of the great toe. He states His shoes cause this issue and he is currently wearing different shoes. He is on clindamycin as there was concern for infection to the right great toe. He currently denies signs of infection. He has been keeping the great toe open to air. 12/4; patient presents for follow-up. He has been using Medihoney and Hydrofera Blue to the heel wound and right great toe wound. The heel wound is closed. The right great toe wound has bone exposed. He now has a new wound to the right ankle that he states he used to shoehorn to scratch the area and created the wound. He has not been dressing this area. Patient History Information obtained from Patient, Chart. Family History Diabetes - Child, Stroke - Mother, No family history of Cancer, Heart Disease, Hereditary  Spherocytosis, Hypertension, Kidney Disease, Lung Disease, Seizures, Thyroid Problems, Tuberculosis. Social History Former smoker, Marital Status - Divorced, Alcohol Use - Moderate, Drug Use - Prior History, Caffeine Use - Daily. Medical History [name], [name] ([id number]) [id number] Page 6 of 9 Eyes Patient has history of Cataracts - Surgery Cardiovascular Patient has history of Angina - AFib Endocrine Patient has history of Type II Diabetes Musculoskeletal Patient has history of Gout, Osteoarthritis Denies  history of Rheumatoid Arthritis, Osteomyelitis Neurologic Patient has history of Neuropathy Hospitalization/Surgery History - inpatient 09/2021 arterial stenting on legs.. Medical A Surgical History Notes nd Cardiovascular Hx CVA Objective Constitutional respirations regular, non-labored and within target range for patient.. Vitals Time Taken: 1:45 PM, Height: 68 in, Weight: 198 lbs, BMI: 30.1, Temperature: 98.4 F, Pulse: 90 bpm, Respiratory Rate: 20 breaths/min, Blood Pressure: 105/62 mmHg. Cardiovascular 2+ dorsalis pedis/posterior tibialis pulses. Psychiatric pleasant and cooperative. General Notes: Right foot: T the calcaneus there is epithelization to the previous wound site. T the dorsal aspect of the right great toe there is an open wound o o with granulation tissue and bone exposed. T the anterior ankle crease there is an open wound with nonviable tissue throughout. There is increased swelling and o redness to the right great toe. Integumentary (Hair, Skin) Wound #1 status is Open. Original cause of wound was Gradually Appeared. The date acquired was: 06/05/2021. The wound has been in treatment 14 weeks. The wound is located on the Right Calcaneus. The wound measures 0cm length x 0cm width x 0cm depth; 0cm^2 area and 0cm^3 volume. There is no tunneling or undermining noted. There is a none present amount of drainage noted. The wound margin  is well defined and not attached to the wound base. There is no granulation within the wound bed. There is no necrotic tissue within the wound bed. The periwound skin appearance did not exhibit: Callus, Crepitus, Excoriation, Induration, Rash, Scarring, Dry/Scaly, Maceration, Atrophie Blanche, Cyanosis, Ecchymosis, Hemosiderin Staining, Mottled, Pallor, Rubor, Erythema. Wound #2 status is Open. Original cause of wound was Trauma. The date acquired was: 11/10/2021. The wound has been in treatment 3 weeks. The wound is located on the Right T Great. The wound measures 0.9cm length x 1cm width x 0.1cm depth; 0.707cm^2 area and 0.071cm^3 volume. There is bone and Fat oe Layer (Subcutaneous Tissue) exposed. There is no tunneling or undermining noted. There is a medium amount of serosanguineous drainage noted. The wound margin is distinct with the outline attached to the wound base. There is large (67-100%) red, pink granulation within the wound bed. There is a small (1-33%) amount of necrotic tissue within the wound bed including Adherent Slough. The periwound skin appearance did not exhibit: Callus, Crepitus, Excoriation, Induration, Rash, Scarring, Dry/Scaly, Maceration, Atrophie Blanche, Cyanosis, Ecchymosis, Hemosiderin Staining, Mottled, Pallor, Rubor, Erythema. Wound #3 status is Open. Original cause of wound was Shear/Friction. The date acquired was: 12/04/2021. The wound is located on the Right,Anterior Ankle. The wound measures 1.7cm length x 0.8cm width x 0.1cm depth; 1.068cm^2 area and 0.107cm^3 volume. There is no tunneling or undermining noted. There is a none present amount of drainage noted. The wound margin is distinct with the outline attached to the wound base. There is a large (67-100%) amount of necrotic tissue within the wound bed including Eschar. The periwound skin appearance did not exhibit: Callus, Crepitus, Excoriation, Induration, Rash, Scarring, Dry/Scaly, Maceration, Atrophie  Blanche, Cyanosis, Ecchymosis, Hemosiderin Staining, Mottled, Pallor, Rubor, Erythema. Assessment Active Problems ICD-10 Type 2 diabetes mellitus with foot ulcer Peripheral vascular disease, unspecified Other acute osteomyelitis, right ankle and foot Type 2 diabetes mellitus with diabetic neuropathy, unspecified Non-pressure chronic ulcer of other part of right foot with necrosis of bone Patient's right heel wound has closed. Unfortunately his right great toe wound taps to bone. He is currently on chronic amoxicillin prescribed by ID. He states he is on no other antibiotic. I recommended doxycycline. He can continue Medihoney and Hydrofera Blue here.  We discussed how he is at high risk for amputation to this site. He expressed understanding. Daughter was present. Apparently he has been following also with podiatry. They were unable to tell me this history. They also noted bone exposed. He has developed a new wound caused by a shoe horn. I recommended Medihoney and Hydrofera Blue here. [name], [name] ([id number]) [id number] Page 7 of 9 recommended offloading the areas with foam padding. Follow-up in 1 week. Procedures Wound #3 Pre-procedure diagnosis of Wound #3 is a Diabetic Wound/Ulcer of the Lower Extremity located on the Right,Anterior Ankle .Severity of Tissue Pre Debridement is: Fat layer exposed. There was a Excisional Skin/Subcutaneous Tissue Debridement with a total area of 1.36 sq cm performed by Kalman Shan, DO. With the following instrument(s): Curette to remove Viable and Non-Viable tissue/material. Material removed includes Subcutaneous Tissue and Slough and after achieving pain control using Lidocaine. No specimens were taken. A time out was conducted at 13:49, prior to the start of the procedure. A Minimum amount of bleeding was controlled with Pressure. The procedure was tolerated well with a pain level of 0 throughout and a pain level of 0  following the procedure. Post Debridement Measurements: 1.7cm length x 0.8cm width x 0.1cm depth; 0.107cm^3 volume. Character of Wound/Ulcer Post Debridement is improved. Severity of Tissue Post Debridement is: Fat layer exposed. Post procedure Diagnosis Wound #3: Same as Pre-Procedure Plan Follow-up Appointments: Return Appointment in 1 week. - w/ Dr. Heber [place] ( any day is fine except Wednesday) Anesthetic: (In clinic) Topical Lidocaine 5% applied to wound bed (In clinic) Topical Lidocaine 4% applied to wound bed Cellular or Tissue Based Products: Other Cellular or Tissue Based Products Orders/Instructions: - Will run IVR for Organ[employer] products. Bathing/ Shower/ Hygiene: May shower and wash wound with soap and water. Edema Control - Lymphedema / SCD / Other: Avoid standing for long periods of time. Moisturize legs daily. Off-Loading: Open toe surgical shoe to: - Wear socks with your shoes Other: - -Keep pressure off area as much as is possible -Will order Prevalon Boot Additional Orders / Instructions: Follow Nutritious Diet The following medication(s) was prescribed: doxycycline hyclate oral 100 mg tablet 1 1 tablet oral twice a day x 14 days starting 12/08/2021 WOUND #2: - T Great Wound Laterality: Right oe Cleanser: Soap and Water 1 x Per Day/7 Days Discharge Instructions: May shower and wash wound with dial antibacterial soap and water prior to dressing change. Peri-Wound Care: Skin Prep (Generic) 1 x Per Day/7 Days Discharge Instructions: Use skin prep as directed Prim Dressing: MediHoney Gel, tube 1.5 (oz) 1 x Per Day/7 Days ary Discharge Instructions: Apply to wound bed as instructed Prim Dressing: Hydrofera Blue Ready Foam, 2.5 x2.5 in (Generic) 1 x Per Day/7 Days ary Discharge Instructions: Apply to wound bed as instructed Secondary Dressing: ALLEVYN Gentle Border, 3x3 (in/in) (Dispense As Written) 1 x Per Day/7 Days Discharge Instructions: Apply over primary dressing  as directed. Com pression Wrap: tubi grip size E 1 x Per Day/7 Days Add-Ons: Cotton Tip Swabs (Generic) 1 x Per Day/7 Days WOUND #3: - Ankle Wound Laterality: Right, Anterior Cleanser: Soap and Water 1 x Per Day/7 Days Discharge Instructions: May shower and wash wound with dial antibacterial soap and water prior to dressing change. Peri-Wound Care: Skin Prep (Generic) 1 x Per Day/7 Days Discharge Instructions: Use skin prep as directed Prim Dressing: MediHoney Gel, tube 1.5 (oz) 1 x Per Day/7 Days ary Discharge Instructions: Apply to wound bed as instructed Prim  Dressing: Hydrofera Blue Ready Foam, 2.5 x2.5 in (Generic) 1 x Per Day/7 Days ary Discharge Instructions: Apply to wound bed as instructed Secondary Dressing: ALLEVYN Gentle Border, 3x3 (in/in) (Dispense As Written) 1 x Per Day/7 Days Discharge Instructions: Apply over primary dressing as directed. Com pression Wrap: tubi grip size E 1 x Per Day/7 Days Add-Ons: Cotton Tip Swabs (Generic) 1 x Per Day/7 Days 1. Medihoney and Hydrofera Blue 2. Doxycycline 3. Aggressive offloadingoofoam padding 4. Follow-up in 1 week Electronic Signature(s) Signed: 12/08/2021 3:20:41 PM By: Kalman Shan DO Entered By: Kalman Shan on 12/08/2021 14:34:07 [name] ([id number]) [id number] Page 8 of 9 -------------------------------------------------------------------------------- HxROS Details Patient Name: Date of Service: [name], [name] MES L. 12/08/2021 1:30 PM Medical Record Number: [id number] Patient Account Number: [id number] Date of Birth/Sex: Treating RN: [birth year] (86 y.o. M) Primary Care Provider: Dorthy Cooler, Dibas Other Clinician: Referring Provider: Treating Provider/Extender: Alanda Slim, Dibas Weeks in Treatment: 14 Information Obtained From Patient Chart Eyes Medical History: Positive for: Cataracts - Surgery Cardiovascular Medical History: Positive for: Angina - AFib Past  Medical History Notes: Hx CVA Endocrine Medical History: Positive for: Type II Diabetes Treated with: Oral agents, Diet Blood sugar tested every day: No Musculoskeletal Medical History: Positive for: Gout; Osteoarthritis Negative for: Rheumatoid Arthritis; Osteomyelitis Neurologic Medical History: Positive for: Neuropathy HBO Extended History Items Eyes: Cataracts Immunizations Pneumococcal Vaccine: Received Pneumococcal Vaccination: No Implantable Devices None Hospitalization / Surgery History Type of Hospitalization/Surgery inpatient 09/2021 arterial stenting on legs. Family and Social History Cancer: No; Diabetes: Yes - Child; Heart Disease: No; Hereditary Spherocytosis: No; Hypertension: No; Kidney Disease: No; Lung Disease: No; Seizures: No; Stroke: Yes - Mother; Thyroid Problems: No; Tuberculosis: No; Former smoker; Marital Status - Divorced; Alcohol Use: Moderate; Drug Use: Prior History; Caffeine Use: Daily; Financial Concerns: No; Food, Clothing or Shelter Needs: No; Support System Lacking: No; Transportation Concerns: No Electronic Signature(s) Signed: 12/08/2021 3:20:41 PM By: Kalman Shan DO Entered By: Kalman Shan on 12/08/2021 14:23:57 Lamke, [name] ([id number]) [id number] Page 9 of 9 -------------------------------------------------------------------------------- SuperBill Details Patient Name: Date of Service: [name], [name] MES L. 12/08/2021 Medical Record Number: [id number] Patient Account Number: [id number] Date of Birth/Sex: Treating RN: April 08, 1935 (86 y.o. [name], Lauren Primary Care Provider: Dorthy Cooler, Dibas Other Clinician: Referring Provider: Treating Provider/Extender: Alanda Slim, Dibas Weeks in Treatment: 14 Diagnosis Coding ICD-10 Codes Code Description E11.621 Type 2 diabetes mellitus with foot ulcer I73.9 Peripheral vascular disease, unspecified M86.171 Other acute osteomyelitis, right ankle  and foot E11.40 Type 2 diabetes mellitus with diabetic neuropathy, unspecified L97.512 Non-pressure chronic ulcer of other part of right foot with fat layer exposed Facility Procedures : CPT4 Code: 23557322 Description: 02542 - WOUND CARE VISIT-LEV 4 EST PT Modifier: Quantity: 1 : CPT4 Code: 70623762 Description: 83151 - DEB SUBQ TISSUE 20 SQ CM/< ICD-10 Diagnosis Description L97.512 Non-pressure chronic ulcer of other part of right foot with fat layer exposed Modifier: Quantity: 1 Physician Procedures : CPT4 Code Description Modifier [id number] 71062 - WC PHYS LEVEL 3 - EST PT ICD-10 Diagnosis Description E11.621 Type 2 diabetes mellitus with foot ulcer M86.171 Other acute osteomyelitis, right ankle and foot L97.512 Non-pressure chronic ulcer of other  part of right foot with fat layer exposed I73.9 Peripheral vascular disease, unspecified Quantity: 1 : [id number] 11042 - WC PHYS SUBQ TISS 20 SQ CM ICD-10 Diagnosis Description L97.512 Non-pressure chronic ulcer of other part of right foot with fat layer exposed Quantity: 1 Electronic Signature(s) Signed: 12/08/2021 3:20:41 PM By: Kalman Shan DO Entered  By: Kalman Shan on 12/08/2021 14:34:33

## 2021-12-08 NOTE — Progress Notes (Signed)
FENTON, CANDEE ([id number]) [id number] Page 1 of 10 Visit Report for 12/08/2021 Arrival Information Details Patient Name: Date of Service: [name]. 12/08/2021 1:30 PM Medical Record Number: [id number] Patient Account Number: [id number] Date of Birth/Sex: Treating RN: January 05, 1936 (86 y.o. [name] Primary Care [name]: [name], [name] Other Clinician: Referring [name]: Treating [name]: [name], [name]: 14 Visit Information History Since Last Visit Added or deleted any medications: No Patient Arrived: Walker Any new allergies or adverse reactions: No Arrival Time: 13:45 Had a fall or experienced change in No Accompanied By: daughter activities of daily living that may affect Transfer Assistance: None risk of falls: Patient Identification Verified: Yes Signs or symptoms of abuse/neglect since last visito No Secondary Verification Process Completed: Yes Hospitalized since last visit: No Patient Requires Transmission-Based Precautions: No Implantable device outside of the clinic excluding No Patient Has Alerts: Yes cellular tissue based products placed in the center Patient Alerts: Patient on Blood Thinner since last visit: ABI R=.58 TBI R=[place] Has Dressing in Place as Prescribed: Yes Pain Present Now: Yes Electronic Signature(s) Signed: 12/08/2021 4:59:17 PM By: Kenneth Pilling RN, BSN Entered By: [name] on 12/08/2021 13:50:15 -------------------------------------------------------------------------------- Clinic Level of Care Assessment Details Patient Name: Date of Service: [name]. 12/08/2021 1:30 PM Medical Record Number: [id number] Patient Account Number: [id number] Date of Birth/Sex: Treating RN: January 01, 1936 (86 y.o. [name] Primary Care [name]: [name], [name] Other Clinician: Referring [name]: Treating [name]: [name], [name] Weeks in  Hood: 14 Clinic Level of Care Assessment Items TOOL 4 Quantity Score X- 1 0 Use when only an EandM is performed on FOLLOW-UP visit ASSESSMENTS - Nursing Assessment / Reassessment X- 1 10 Reassessment of Co-morbidities (includes updates in patient status) X- 1 5 Reassessment of Adherence to Hood Plan ASSESSMENTS - Wound and Skin A ssessment / Reassessment '[]'$  - 0 Simple Wound Assessment / Reassessment - one wound X- 2 5 Complex Wound Assessment / Reassessment - multiple wounds '[]'$  - 0 Dermatologic / Skin Assessment (not related to wound area) ASSESSMENTS - Focused Assessment X- 1 5 Circumferential Edema Measurements - multi extremities '[]'$  - 0 Nutritional Assessment / Counseling / Intervention [name] ([id number]) [id number] Page 2 of 10 '[]'$  - 0 Lower Extremity Assessment (monofilament, tuning fork, pulses) '[]'$  - 0 Peripheral Arterial Disease Assessment (using hand held doppler) ASSESSMENTS - Ostomy and/or Continence Assessment and Care '[]'$  - 0 Incontinence Assessment and Management '[]'$  - 0 Ostomy Care Assessment and Management (repouching, etc.) PROCESS - Coordination of Care '[]'$  - 0 Simple Patient / Family Education for ongoing care X- 1 20 Complex (extensive) Patient / Family Education for ongoing care X- 1 10 Staff obtains Programmer, systems, Records, T Results / Process Orders est '[]'$  - 0 Staff telephones HHA, Nursing Homes / Clarify orders / etc '[]'$  - 0 Routine Transfer to another Facility (non-emergent condition) '[]'$  - 0 Routine Hospital Admission (non-emergent condition) '[]'$  - 0 New Admissions / Biomedical engineer / Ordering NPWT Apligraf, etc. , '[]'$  - 0 Emergency Hospital Admission (emergent condition) '[]'$  - 0 Simple Discharge Coordination X- 1 15 Complex (extensive) Discharge Coordination PROCESS - Special Needs '[]'$  - 0 Pediatric / Minor Patient Management '[]'$  - 0 Isolation Patient Management '[]'$  - 0 Hearing / Language /  Visual special needs '[]'$  - 0 Assessment of Community assistance (transportation, D/C planning, etc.) '[]'$  - 0 Additional assistance / Altered mentation '[]'$  - 0 Support Surface(s) Assessment (bed, cushion, seat, etc.) INTERVENTIONS - Wound  Cleansing / Measurement '[]'$  - 0 Simple Wound Cleansing - one wound X- 2 5 Complex Wound Cleansing - multiple wounds X- 1 5 Wound Imaging (photographs - any number of wounds) '[]'$  - 0 Wound Tracing (instead of photographs) '[]'$  - 0 Simple Wound Measurement - one wound X- 2 5 Complex Wound Measurement - multiple wounds INTERVENTIONS - Wound Dressings '[]'$  - 0 Small Wound Dressing one or multiple wounds X- 2 15 Medium Wound Dressing one or multiple wounds '[]'$  - 0 Large Wound Dressing one or multiple wounds '[]'$  - 0 Application of Medications - topical '[]'$  - 0 Application of Medications - injection INTERVENTIONS - Miscellaneous '[]'$  - 0 External ear exam '[]'$  - 0 Specimen Collection (cultures, biopsies, blood, body fluids, etc.) '[]'$  - 0 Specimen(s) / Culture(s) sent or taken to Lab for analysis '[]'$  - 0 Patient Transfer (multiple staff / Civil Service fast streamer / Similar devices) '[]'$  - 0 Simple Staple / Suture removal (25 or less) '[]'$  - 0 Complex Staple / Suture removal (26 or more) '[]'$  - 0 Hypo / Hyperglycemic Management (close monitor of Blood Glucose) [name] ([id number]) [id number] Page 3 of 10 '[]'$  - 0 Ankle / Brachial Index (ABI) - do not check if billed separately X- 1 5 Vital Signs Has the patient been seen at the hospital within the last three years: Yes Total Score: 135 Level Of Care: New/Established - Level 4 Electronic Signature(s) Signed: 12/08/2021 3:46:46 PM By: Kenneth Hammock RN Entered By: [name] on 12/08/2021 14:14:44 -------------------------------------------------------------------------------- Lower Extremity Assessment Details Patient Name: Date of Service: [name]. 12/08/2021 1:30  PM Medical Record Number: [id number] Patient Account Number: [id number] Date of Birth/Sex: Treating RN: [birth year] (86 y.o. [name] Primary Care Jamarl Pew: [name], [name] Other Clinician: Referring Shereda Graw: Treating Patricia Perales/Extender: [name], [name]: 14 Edema Assessment Assessed: [Left: No] [Right: Yes] Edema: [Left: Ye] [Right: s] Calf Left: Right: Point of Measurement: 42 cm From Medial Instep 39 cm Ankle Left: Right: Point of Measurement: 8 cm From Medial Instep 23 cm Vascular Assessment Pulses: Dorsalis Pedis Palpable: [Right:Yes] Electronic Signature(s) Signed: 12/08/2021 4:59:17 PM By: Kenneth Pilling RN, BSN Entered By: [name] on 12/08/2021 13:50:47 -------------------------------------------------------------------------------- Multi Wound Chart Details Patient Name: Date of Service: [name]. 12/08/2021 1:30 PM Medical Record Number: [id number] Patient Account Number: [id number] Date of Birth/Sex: Treating RN: January 10, 1935 (86 y.o. M) Primary Care Lorrine Killilea: [name], [name] Other Clinician: Referring Berish Bohman: Treating Sandria Mcenroe/Extender: [name], [name]: 14 Vital Signs Height(in): 68 Pulse(bpm): 90 Weight(lbs): 198 Blood Pressure(mmHg): 105/62 Body Mass Index(BMI): 30.1 Temperature(F): 98.4 [name] ([id number]) [id number] Page 4 of 10 Respiratory Rate(breaths/min): 20 [1:Photos:] Right Calcaneus Right T Great oe Right, Anterior Ankle Wound Location: Gradually Appeared Trauma Shear/Friction Wounding Event: Diabetic Wound/Ulcer of the Lower Diabetic Wound/Ulcer of the Lower Diabetic Wound/Ulcer of the Lower Primary Etiology: Extremity Extremity Extremity N/A Abrasion Pressure Ulcer Secondary Etiology: Cataracts, Angina, Type II Diabetes, Cataracts, Angina, Type II Diabetes, Cataracts, Angina, Type II Diabetes, Comorbid History: Gout,  Osteoarthritis, Neuropathy Gout, Osteoarthritis, Neuropathy Gout, Osteoarthritis, Neuropathy 06/05/2021 11/10/2021 12/04/2021 Date Acquired: 14 3 0 Weeks of Hood: Open Open Open Wound Status: No No No Wound Recurrence: 0x0x0 0.9x1x0.1 1.7x0.8x0.1 Measurements Hood x W x D (cm) 0 0.707 1.068 A (cm) : rea 0 0.071 0.107 Volume (cm) : 100.00% 9.90% N/A % Reduction in A rea: 100.00% 10.10% N/A % Reduction in Volume: Grade 1 Grade 2 Unable to visualize wound bed Classification: None  Present Medium None Present Exudate A mount: N/A Serosanguineous N/A Exudate Type: N/A red, brown N/A Exudate Color: Well defined, not attached Distinct, outline attached Distinct, outline attached Wound Margin: None Present (0%) Large (67-100%) N/A Granulation A mount: N/A Red, Pink N/A Granulation Quality: None Present (0%) Small (1-33%) N/A Necrotic A mount: N/A Adherent Slough Eschar Necrotic Tissue: Fascia: No Fat Layer (Subcutaneous Tissue): Yes Fascia: No Exposed Structures: Fat Layer (Subcutaneous Tissue): No Bone: Yes Fat Layer (Subcutaneous Tissue): No Tendon: No Fascia: No Tendon: No Muscle: No Tendon: No Muscle: No Joint: No Muscle: No Joint: No Bone: No Joint: No Bone: No Large (67-100%) Small (1-33%) None Epithelialization: N/A N/A Debridement - Excisional Debridement: Pre-procedure Verification/Time Out N/A N/A 13:49 Taken: N/A N/A Lidocaine Pain Control: N/A N/A Subcutaneous, Slough Tissue Debrided: N/A N/A Skin/Subcutaneous Tissue Level: N/A N/A 1.36 Debridement A (sq cm): rea N/A N/A Curette Instrument: N/A N/A Minimum Bleeding: N/A N/A Pressure Hemostasis A chieved: N/A N/A 0 Procedural Pain: N/A N/A 0 Post Procedural Pain: N/A N/A Procedure was tolerated well Debridement Hood Response: N/A N/A 1.7x0.8x0.1 Post Debridement Measurements Hood x W x D (cm) N/A N/A 0.107 Post Debridement Volume: (cm) Excoriation: No Excoriation:  No Excoriation: No Periwound Skin Texture: Induration: No Induration: No Induration: No Callus: No Callus: No Callus: No Crepitus: No Crepitus: No Crepitus: No Rash: No Rash: No Rash: No Scarring: No Scarring: No Scarring: No Maceration: No Maceration: No Maceration: No Periwound Skin Moisture: Dry/Scaly: No Dry/Scaly: No Dry/Scaly: No Atrophie Blanche: No Atrophie Blanche: No Atrophie Blanche: No Periwound Skin Color: Cyanosis: No Cyanosis: No Cyanosis: No Ecchymosis: No Ecchymosis: No Ecchymosis: No Erythema: No Erythema: No Erythema: No Hemosiderin Staining: No Hemosiderin Staining: No Hemosiderin Staining: No Mottled: No Mottled: No Mottled: No Pallor: No Pallor: No Pallor: No Rubor: No Rubor: No Rubor: No N/A N/A Debridement Procedures Performed: Hood Notes Electronic Signature(s) [name] ([id number]) [id number] Page 5 of 10 Signed: 12/08/2021 3:20:41 PM By: Kalman Shan DO Entered By: Kalman Shan on 12/08/2021 14:22:02 -------------------------------------------------------------------------------- Multi-Disciplinary Care Plan Details Patient Name: Date of Service: [name]. 12/08/2021 1:30 PM Medical Record Number: [id number] Patient Account Number: [id number] Date of Birth/Sex: Treating RN: 1935/03/26 (86 y.o. [name] Primary Care Aizah Gehlhausen: [name], [name] Other Clinician: Referring Jovanne Riggenbach: Treating Roxie Gueye/Extender: [name], [name]: 14 Active Inactive Pressure Nursing Diagnoses: Knowledge deficit related to management of pressures ulcers Goals: Patient/caregiver will verbalize understanding of pressure ulcer management Date Initiated: 08/26/2021 Target Resolution Date: 12/06/2021 Goal Status: Active Interventions: Assess: immobility, friction, shearing, incontinence upon admission and as needed Assess offloading mechanisms upon  admission and as needed Provide education on pressure ulcers Notes: Wound/Skin Impairment Nursing Diagnoses: Impaired tissue integrity Goals: Patient/caregiver will verbalize understanding of skin care regimen Date Initiated: 08/26/2021 Target Resolution Date: 12/06/2021 Goal Status: Active Ulcer/skin breakdown will have a volume reduction of 30% by week 4 Date Initiated: 08/26/2021 Target Resolution Date: 01/03/2022 Goal Status: Active Interventions: Assess patient/caregiver ability to obtain necessary supplies Assess patient/caregiver ability to perform ulcer/skin care regimen upon admission and as needed Assess ulceration(s) every visit Provide education on ulcer and skin care Hood Activities: Topical wound management initiated : 08/26/2021 Notes: Electronic Signature(s) Signed: 12/08/2021 3:46:46 PM By: Kenneth Hammock RN Entered By: [name] on 12/08/2021 14:00:21 [name] ([id number]) [id number] Page 6 of 10 -------------------------------------------------------------------------------- Pain Assessment Details Patient Name: Date of Service: [name]. 12/08/2021 1:30 PM Medical Record Number: [id number] Patient Account Number: [id number]  Date of Birth/Sex: Treating RN: [birth year] (86 y.o. [name] Primary Care Mikenzi Raysor: [name], [name] Other Clinician: Referring Vrishank Moster: Treating Nefertiti Mohamad/Extender: [name], [name]: 14 Active Problems Location of Pain Severity and Description of Pain Patient Has Paino Yes Site Locations Pain Location: Generalized Pain Rate the pain. Current Pain Level: 3 Pain Management and Medication Current Pain Management: Medication: No Cold Application: No Rest: No Massage: No Activity: No T.E.N.S.: No Heat Application: No Leg drop or elevation: No Is the Current Pain Management Adequate: Adequate How does your wound impact your activities of  daily livingo Sleep: No Bathing: No Appetite: No Relationship With Others: No Bladder Continence: No Emotions: No Bowel Continence: No Work: No Toileting: No Drive: No Dressing: No Hobbies: No Engineer, maintenance) Signed: 12/08/2021 4:59:17 PM By: Kenneth Pilling RN, BSN Entered By: [name] on 12/08/2021 13:50:36 -------------------------------------------------------------------------------- Patient/Caregiver Education Details Patient Name: Date of Service: [name]. 12/4/2023andnbsp1:30 PM Medical Record Number: [id number] Patient Account Number: [id number] Date of Birth/Gender: Treating RN: [birth year] (86 y.o. [name] Primary Care Physician: [name], [name] Other Clinician: Referring Physician: Treating Physician/Extender: [name], [name]: 43 [name] ([id number]) [id number] Page 7 of 10 Education Assessment Education Provided To: Patient Education Topics Provided Pressure: Methods: Explain/Verbal Responses: Reinforcements needed, State content correctly Electronic Signature(s) Signed: 12/08/2021 3:46:46 PM By: Kenneth Hammock RN Entered By: [name] on 12/08/2021 14:00:33 -------------------------------------------------------------------------------- Wound Assessment Details Patient Name: Date of Service: [name]. 12/08/2021 1:30 PM Medical Record Number: [id number] Patient Account Number: [id number] Date of Birth/Sex: Treating RN: 02-Dec-1935 (86 y.o. [name] Primary Care Cecil Bixby: [name], [name] Other Clinician: Referring Mithra Spano: Treating Swayzie Choate/Extender: [name], [name]: 14 Wound Status Wound Number: 1 Primary Diabetic Wound/Ulcer of the Lower Extremity Etiology: Wound Location: Right Calcaneus Wound Status: Open Wounding Event: Gradually Appeared Comorbid Cataracts, Angina, Type II Diabetes, Gout,  Osteoarthritis, Date Acquired: 06/05/2021 History: Neuropathy Weeks Of Hood: 14 Clustered Wound: No Photos Wound Measurements Length: (cm) Width: (cm) Depth: (cm) Area: (cm) Volume: (cm) 0 % Reduction in Area: 100% 0 % Reduction in Volume: 100% 0 Epithelialization: Large (67-100%) 0 Tunneling: No 0 Undermining: No Wound Description Classification: Grade 1 Wound Margin: Well defined, not attached Exudate Amount: None Present Foul Odor After Cleansing: No Slough/Fibrino No Wound Bed Granulation Amount: None Present (0%) Exposed Structure Necrotic Amount: None Present (0%) Fascia Exposed: No Fat Layer (Subcutaneous Tissue) Exposed: No Tendon Exposed: No SAMAJ, WESSELLS Hood ([id number]) [id number] Page 8 of 10 Muscle Exposed: No Joint Exposed: No Bone Exposed: No Periwound Skin Texture Texture Color No Abnormalities Noted: No No Abnormalities Noted: No Callus: No Atrophie Blanche: No Crepitus: No Cyanosis: No Excoriation: No Ecchymosis: No Induration: No Erythema: No Rash: No Hemosiderin Staining: No Scarring: No Mottled: No Pallor: No Moisture Rubor: No No Abnormalities Noted: No Dry / Scaly: No Maceration: No Electronic Signature(s) Signed: 12/08/2021 4:59:17 PM By: Kenneth Pilling RN, BSN Entered By: [name] on 12/08/2021 13:54:12 -------------------------------------------------------------------------------- Wound Assessment Details Patient Name: Date of Service: [name]. 12/08/2021 1:30 PM Medical Record Number: [id number] Patient Account Number: [id number] Date of Birth/Sex: Treating RN: [birth year] (86 y.o. [name] Primary Care Osie Amparo: [name], [name] Other Clinician: Referring Emoni Whitworth: Treating Tnia Anglada/Extender: [name], [name]: 14 Wound Status Wound Number: 2 Primary Etiology: Diabetic Wound/Ulcer of the Lower Extremity Wound Location: Right T Great oe  Secondary Abrasion Etiology: Wounding Event: Trauma Wound Status:  Open Date Acquired: 11/10/2021 Comorbid Cataracts, Angina, Type II Diabetes, Gout, Osteoarthritis, Weeks Of Hood: 3 History: Neuropathy Clustered Wound: No Photos Wound Measurements Length: (cm) 0.9 Width: (cm) 1 Depth: (cm) 0.1 Area: (cm) 0.707 Volume: (cm) 0.071 % Reduction in Area: 9.9% % Reduction in Volume: 10.1% Epithelialization: Small (1-33%) Tunneling: No Undermining: No Wound Description Classification: Grade 2 Wound Margin: Distinct, outline attached Exudate Amount: Medium Exudate Type: Serosanguineous [name] ([id number]) Exudate Color: red, brown Foul Odor After Cleansing: No Slough/Fibrino Yes [id number] Page 9 of 10 Wound Bed Granulation Amount: Large (67-100%) Exposed Structure Granulation Quality: Red, Pink Fascia Exposed: No Necrotic Amount: Small (1-33%) Fat Layer (Subcutaneous Tissue) Exposed: Yes Necrotic Quality: Adherent Slough Tendon Exposed: No Muscle Exposed: No Joint Exposed: No Bone Exposed: Yes Periwound Skin Texture Texture Color No Abnormalities Noted: No No Abnormalities Noted: No Callus: No Atrophie Blanche: No Crepitus: No Cyanosis: No Excoriation: No Ecchymosis: No Induration: No Erythema: No Rash: No Hemosiderin Staining: No Scarring: No Mottled: No Pallor: No Moisture Rubor: No No Abnormalities Noted: No Dry / Scaly: No Maceration: No Electronic Signature(s) Signed: 12/08/2021 4:59:17 PM By: Kenneth Pilling RN, BSN Entered By: [name] on 12/08/2021 13:54:32 -------------------------------------------------------------------------------- Wound Assessment Details Patient Name: Date of Service: [name]. 12/08/2021 1:30 PM Medical Record Number: [id number] Patient Account Number: [id number] Date of Birth/Sex: Treating RN: [birth year] (86 y.o. [name] Primary Care Man Bonneau: [name], [name] Other  Clinician: Referring Whitnie Deleon: Treating Starling Christofferson/Extender: [name], [name]: 14 Wound Status Wound Number: 3 Primary Etiology: Diabetic Wound/Ulcer of the Lower Extremity Wound Location: Right, Anterior Ankle Secondary Pressure Ulcer Etiology: Wounding Event: Shear/Friction Wound Status: Open Date Acquired: 12/04/2021 Comorbid Cataracts, Angina, Type II Diabetes, Gout, Osteoarthritis, Weeks Of Hood: 0 History: Neuropathy Clustered Wound: No Photos Wound Measurements Length: (cm) 1.7 Width: (cm) 0.8 Depth: (cm) 0.1 Area: (cm) 1.068 [name] ([id number]) Volume: (cm) 0.107 % Reduction in Area: % Reduction in Volume: Epithelialization: None Tunneling: No [id number] Page 10 of 10 Undermining: No Wound Description Classification: Unable to visualize wound bed Wound Margin: Distinct, outline attached Exudate Amount: None Present Foul Odor After Cleansing: No Slough/Fibrino No Wound Bed Necrotic Amount: Large (67-100%) Exposed Structure Necrotic Quality: Eschar Fascia Exposed: No Fat Layer (Subcutaneous Tissue) Exposed: No Tendon Exposed: No Muscle Exposed: No Joint Exposed: No Bone Exposed: No Periwound Skin Texture Texture Color No Abnormalities Noted: No No Abnormalities Noted: No Callus: No Atrophie Blanche: No Crepitus: No Cyanosis: No Excoriation: No Ecchymosis: No Induration: No Erythema: No Rash: No Hemosiderin Staining: No Scarring: No Mottled: No Pallor: No Moisture Rubor: No No Abnormalities Noted: No Dry / Scaly: No Maceration: No Electronic Signature(s) Signed: 12/08/2021 4:59:17 PM By: Kenneth Pilling RN, BSN Entered By: [name] on 12/08/2021 13:54:49 -------------------------------------------------------------------------------- Vitals Details Patient Name: Date of Service: [name]. 12/08/2021 1:30 PM Medical Record Number: [id number] Patient Account  Number: [id number] Date of Birth/Sex: Treating RN: 05/06/1935 (86 y.o. [name] Primary Care Jamaury Gumz: [name], [name] Other Clinician: Referring Meryn Sarracino: Treating Emmelina Mcloughlin/Extender: [name], [name]: 14 Vital Signs Time Taken: 13:45 Temperature (F): 98.4 Height (in): 68 Pulse (bpm): 90 Weight (lbs): 198 Respiratory Rate (breaths/min): 20 Body Mass Index (BMI): 30.1 Blood Pressure (mmHg): 105/62 Reference Range: 80 - 120 mg / dl Electronic Signature(s) Signed: 12/08/2021 4:59:17 PM By: Kenneth Pilling RN, BSN Entered By: [name] on 12/08/2021 13:50:28

## 2021-12-12 ENCOUNTER — Ambulatory Visit (INDEPENDENT_AMBULATORY_CARE_PROVIDER_SITE_OTHER)
Admission: RE | Admit: 2021-12-12 | Discharge: 2021-12-12 | Disposition: A | Discharge status: Home / Self Care | Payer: Medicare Other | Source: Ambulatory Visit | Attending: Vascular Surgery | Admitting: Vascular Surgery

## 2021-12-12 ENCOUNTER — Ambulatory Visit (HOSPITAL_COMMUNITY)
Admission: RE | Admit: 2021-12-12 | Discharge: 2021-12-12 | Disposition: A | Discharge status: Home / Self Care | Payer: Medicare Other | Source: Ambulatory Visit | Attending: Vascular Surgery | Admitting: Vascular Surgery

## 2021-12-12 ENCOUNTER — Ambulatory Visit: Payer: Medicare Other | Admitting: Physician Assistant

## 2021-12-12 DIAGNOSIS — I70223 Atherosclerosis of native arteries of extremities with rest pain, bilateral legs: Secondary | ICD-10-CM

## 2021-12-12 NOTE — Progress Notes (Signed)
POST OPERATIVE OFFICE NOTE    CC:  F/u for surgery  HPI:  [name] is a 86 y.o. male who is status post bilateral iliofemoral endarterectomy with bilateral common iliac artery stents on 10/09/2021 by Dr. Virl Cagey.  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  During his hospitalization, podiatry felt that his heel would heal after vascular intervention.  He was referred to the wound care center for follow-up outpatient.  At his last follow-up with [place], he was having some issues with right calf swelling.  DVT study was negative and this was thought to be reperfusion swelling.  His right heel was healing better.  His bilateral groin incisions were nearly healed.   At follow-up today, the patient is still having some right calf swelling which improves with leg elevation.  His right heel ulcer finished healing about 2 weeks ago.  However 3 weeks ago he developed a ulcer on his right great toe after his shoe rubbed it raw.  He also developed a small ulcer on his right ankle after an allergic reaction to skin tape.  The wound care center has been following these wounds and still recommend Medihoney and gauze dressings to them.  Both the patient and his daughter think that his right great toe and ankle wounds have improved since they first started.  There is no drainage from either of them or signs of infection.  He was prophylactically placed on doxycycline, which he is still taking at this time.   Allergies  Allergen Reactions   Allopurinol Other (See Comments)    Makes head 'feel funny'   Atorvastatin Other (See Comments)    Unknown reaction    Motrin [Ibuprofen] Other (See Comments)    Blisters in groin area    Current Outpatient Medications  Medication Sig Dispense Refill   amoxicillin (AMOXIL) 500 MG capsule TAKE 1 CAPSULE BY MOUTH TWICE A DAY (Patient not taking: Reported on 10/07/2021) 60 capsule 2   APPLE CIDER VINEGAR PO Take 1 capsule by mouth  daily.     aspirin EC 81 MG tablet Take 81 mg by mouth every other day. Swallow whole.     atenolol (TENORMIN) 50 MG tablet Take 50 mg by mouth daily.     cefadroxil (DURICEF) 500 MG capsule Take 1 capsule (500 mg total) by mouth 2 (two) times daily. (Patient not taking: Reported on 09/29/2021) 60 capsule 1   clopidogrel (PLAVIX) 75 MG tablet TAKE 1 TABLET (75 MG TOTAL) BY MOUTH DAILY AT 6 (SIX) AM. 90 tablet 1   doxycycline (VIBRA-TABS) 100 MG tablet Take 1 tablet (100 mg total) by mouth 2 (two) times daily. 60 tablet 1   fluticasone (FLONASE) 50 MCG/ACT nasal spray Place 2 sprays into both nostrils daily.     furosemide (LASIX) 20 MG tablet Take 20 mg by mouth daily.     hydrALAZINE (APRESOLINE) 25 MG tablet Take 1 tablet (25 mg total) by mouth every 8 (eight) hours. (Patient taking differently: Take 25 mg by mouth 2 (two) times daily.)     irbesartan (AVAPRO) 75 MG tablet Take 75 mg by mouth daily.     LYRICA 75 MG capsule Take 75-150 mg by mouth See admin instructions. Take 75 mg by mouth in the morning and 150 mg at night     meloxicam (MOBIC) 7.5 MG tablet Take 7.5 mg by mouth daily.     metFORMIN (GLUCOPHAGE) 1000 MG tablet Take 1,000 mg by mouth in  the morning and at bedtime.     Multiple Vitamin (MULTIVITAMIN WITH MINERALS) TABS tablet Take 1 tablet by mouth daily.     neomycin-bacitracin-polymyxin (NEOSPORIN) [phone] ointment Apply 1 Application topically daily as needed (wound care).     OVER THE COUNTER MEDICATION Take 1 capsule by mouth daily. Balance of Nature Fruits     OVER THE COUNTER MEDICATION Take 1 tablet by mouth daily. Beet supplement     protein supplement shake (PREMIER PROTEIN) LIQD Take 11 oz by mouth daily.     rosuvastatin (CRESTOR) 20 MG tablet Take 1 tablet (20 mg total) by mouth daily. 90 tablet 3   Tetrahydrozoline HCl (VISINE OP) Place 1 drop into both eyes daily as needed (burning).     traMADol (ULTRAM) 50 MG tablet Take 2 tablets (100 mg total) by mouth  every 6 (six) hours as needed. Every 4 hours 30 tablet 0   ULORIC 80 MG TABS Take 80 mg by mouth daily.  3   No current facility-administered medications for this visit.     ROS:  See HPI  Physical Exam:   Incision: Bilateral groin incisions well-healed without signs of infection or hematoma Extremities: Right heel ulcer resolved.  Small ulcer on top of right great toe and right anterior ankle.  Both of these appear to be superficial with healthy tissue.  Brisk bilateral PT Doppler signals Neuro: intact motor and sensation of RLE     Studies: ABI (12/12/2021) +-------+-----------+-----------+------------+------------+  ABI/TBIToday's ABIToday's TBIPrevious ABIPrevious TBI  +-------+-----------+-----------+------------+------------+  Right 0.84       bandage    0.71        0.49          +-------+-----------+-----------+------------+------------+  Left  0.48       0.39       0.44        0.31          +-------+-----------+-----------+------------+------------+   Aorta/IVC/Iliac Artery Duplex (12/12/2021) Location     AP (cm)Trans (cm)PSV (cm/s)Waveform ThrombusComments  +-------------+-------+----------+----------+---------+--------+--------+  RT CIA Prox                   181       biphasic                   +-------------+-------+----------+----------+---------+--------+--------+  RT CIA Mid                    170       biphasic                   +-------------+-------+----------+----------+---------+--------+--------+  RT CIA Distal                 194       triphasic                  +-------------+-------+----------+----------+---------+--------+--------+  RT EIA Prox                   138       biphasic                   +-------------+-------+----------+----------+---------+--------+--------+  RT EIA Mid                    170       biphasic                    +-------------+-------+----------+----------+---------+--------+--------+  RT EIA Distal  194       biphasic                   +-------------+-------+----------+----------+---------+--------+--------+  LT CIA Prox                   146       biphasic                   +-------------+-------+----------+----------+---------+--------+--------+  LT CIA Mid                    123       biphasic                   +-------------+-------+----------+----------+---------+--------+--------+  LT CIA Distal                 214       triphasic                  +-------------+-------+----------+----------+---------+--------+--------+  LT EIA Prox                   138       biphasic                   +-------------+-------+----------+----------+---------+--------+--------+   Summary:  Stenosis: +------------------+-----------+  Location          Stent        +------------------+-----------+  Right Common Iliacno stenosis  +------------------+-----------+  Left Common Iliac no stenosis  +------------------+-----------+   Assessment/Plan:  This is a 86 y.o. male who is s/p: Bilateral iliofemoral endarterectomy with bilateral common iliac artery stents on 10/09/2021 by Dr.Robins   -Based on vascular studies, the patient's right ABI is improved since surgery from 0.58 to 0.84. His left ABI has slightly dropped from 0.53 to 0.48. However, aorto-iliac duplex study demonstrates patent common iliac artery stents with no stenosis. -The patient has brisk bilateral PT Doppler signals -His right heel ulcer has now resolved.  However he has developed 2 new wounds on his right great toe and right ankle.  These wounds have improved since their first appearance 3 weeks ago.  Dr. Virl Cagey has seen the patient and agrees that if the wounds are improving, we will continue to monitor them for now.  If his wounds worsen, he may require repeat angiogram -He will  follow up with [place] in 1 month for repeat wound check   Vicente Serene, PA-C [facility] [phone]   Clinic MD:  Virl Cagey

## 2021-12-15 ENCOUNTER — Ambulatory Visit: Payer: Medicare Other | Admitting: Podiatry

## 2021-12-15 DIAGNOSIS — L97412 Non-pressure chronic ulcer of right heel and midfoot with fat layer exposed: Secondary | ICD-10-CM | POA: Diagnosis not present

## 2021-12-15 DIAGNOSIS — M86171 Other acute osteomyelitis, right ankle and foot: Secondary | ICD-10-CM | POA: Diagnosis not present

## 2021-12-15 NOTE — Patient Instructions (Signed)
Continue dressings per the wound care center Monitor for any signs/symptoms of infection. Call the office immediately if any occur or go directly to the emergency room. Call with any questions/concerns.

## 2021-12-16 ENCOUNTER — Encounter (HOSPITAL_BASED_OUTPATIENT_CLINIC_OR_DEPARTMENT_OTHER): Payer: Medicare Other | Admitting: Internal Medicine

## 2021-12-18 NOTE — Progress Notes (Deleted)
  Cardiology Office Note:    Date:  12/18/2021   ID:  [name], DOB 1935-06-24, MRN [id number]  PCP:  Lujean Amel, [facility] Cardiologist:  Larae Grooms, MD { Click to update primary MD,subspecialty MD or APP then REFRESH:1}  *** Referring MD: Lujean Amel, MD   Chief Complaint:  No chief complaint on file. 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 male with the above problem list.  He was last seen by Dr. Irish Lack for surgical clearance on 09/29/21. He was not started on anticoagulation. He was deemed a poor candidate for anticoagulation in the past. F/u was arranged today to consider +/- anticoagulation. ***      EKG:  {Desc; done/not:10129}    Reviewed and updated this encounter:***     ROS  Labs/Other Test Reviewed:   Recent Labs: 10/09/2021: ALT 22 10/10/2021: BUN 17; Creatinine, Ser 0.66; Hemoglobin 12.8; Platelets 177; Potassium 4.2; Sodium 131  Recent Lipid Panel Recent Labs    10/10/21 0054  CHOL 122  TRIG 49  HDL 52  VLDL 10  LDLCALC 60    Risk Assessment/Calculations/Metrics:   {Does this patient have ATRIAL FIBRILLATION?:[id number]}     No BP recorded.  {Refresh Note OR Click here to enter BP  :1}***   Physical Exam:   VS:  There were no vitals taken for this visit.   Wt Readings from Last 3 Encounters:  10/27/21 185 lb (83.9 kg)  10/09/21 180 lb (81.6 kg)  09/29/21 180 lb 9.6 oz (81.9 kg)    Physical Exam ***     ASSESSMENT & PLAN:   No problem-specific  Assessment & Plan notes found for this encounter.        {Are you ordering a CV Procedure (e.g. stress test, cath, DCCV, TEE, etc)?   Press F2        :[id number]}  Dispo:  No follow-ups on file.  Medication Adjustments/Labs and Tests Ordered: Current medicines are reviewed at length with the patient today.  Concerns regarding medicines are outlined above.  Tests Ordered: No orders of the defined types were placed in this encounter.  Medication Changes: No orders of the defined types were placed in this encounter.  Signed, Richardson Dopp, PA-C  12/18/2021 10:09 PM    [address], [place], [place]  [address] Phone: [phone]; Fax: [phone]

## 2021-12-19 ENCOUNTER — Other Ambulatory Visit: Payer: Medicare Other

## 2021-12-19 ENCOUNTER — Ambulatory Visit: Payer: Medicare Other | Admitting: Physician Assistant

## 2021-12-19 DIAGNOSIS — I4819 Other persistent atrial fibrillation: Secondary | ICD-10-CM

## 2021-12-19 DIAGNOSIS — M86171 Other acute osteomyelitis, right ankle and foot: Secondary | ICD-10-CM | POA: Diagnosis not present

## 2021-12-19 DIAGNOSIS — E782 Mixed hyperlipidemia: Secondary | ICD-10-CM | POA: Diagnosis not present

## 2021-12-19 LAB — HEPATIC FUNCTION PANEL
ALT: 13 IU/L (ref 0–44)
AST: 20 IU/L (ref 0–40)
Albumin: 4.6 g/dL (ref 3.7–4.7)
Alkaline Phosphatase: 82 IU/L (ref 44–121)
Bilirubin Total: 0.6 mg/dL (ref 0.0–1.2)
Bilirubin, Direct: 0.22 mg/dL (ref 0.00–0.40)
Total Protein: 7 g/dL (ref 6.0–8.5)

## 2021-12-19 LAB — LIPID PANEL
Chol/HDL Ratio: 2.1 ratio (ref 0.0–5.0)
Cholesterol, Total: 120 mg/dL (ref 100–199)
HDL: 57 mg/dL (ref >39)
LDL Chol Calc (NIH): 47 mg/dL (ref 0–99)
Triglycerides: 82 mg/dL (ref 0–149)
VLDL Cholesterol Cal: 16 mg/dL (ref 5–40)

## 2021-12-20 ENCOUNTER — Other Ambulatory Visit: Payer: Self-pay | Admitting: Internal Medicine

## 2021-12-20 LAB — CBC WITH DIFFERENTIAL/PLATELET
Basophils Absolute: 0.1 10*3/uL (ref 0.0–0.2)
Basos: 1 %
EOS (ABSOLUTE): 0.2 10*3/uL (ref 0.0–0.4)
Eos: 3 %
Hematocrit: 35.1 % — ABNORMAL LOW (ref 37.5–51.0)
Hemoglobin: 11.6 g/dL — ABNORMAL LOW (ref 13.0–17.7)
Immature Grans (Abs): 0 10*3/uL (ref 0.0–0.1)
Immature Granulocytes: 0 %
Lymphocytes Absolute: 1.4 10*3/uL (ref 0.7–3.1)
Lymphs: 25 %
MCH: 31.4 pg (ref 26.6–33.0)
MCHC: 33 g/dL (ref 31.5–35.7)
MCV: 95 fL (ref 79–97)
Monocytes Absolute: 0.6 10*3/uL (ref 0.1–0.9)
Monocytes: 10 %
Neutrophils Absolute: 3.3 10*3/uL (ref 1.4–7.0)
Neutrophils: 61 %
Platelets: 258 10*3/uL (ref 150–450)
RBC: 3.69 x10E6/uL — ABNORMAL LOW (ref 4.14–5.80)
RDW: 13.5 % (ref 11.6–15.4)
WBC: 5.5 10*3/uL (ref 3.4–10.8)

## 2021-12-20 LAB — SEDIMENTATION RATE: Sed Rate: 16 mm/hr (ref 0–30)

## 2021-12-20 LAB — C-REACTIVE PROTEIN: CRP: 2 mg/L (ref 0–10)

## 2021-12-22 NOTE — Progress Notes (Signed)
Subjective: Chief Complaint  Patient presents with   Foot Ulcer    Left hallux and ankle ulcer, Patient denies any pain,  TX: [facility] wound center,      86 year old male presents the above concerns.  He is still following up with the wound care center as well.  Any fevers or chills.   He was last seen by vascular on 10/23   Denies any systemic complaints such as fevers, chills, nausea, vomiting.   Objective: AAO x3, NAD Full-thickness ulceration noted on the dorsal aspect of right hallux however with improvement today.  There is no probing to bone, amount or tunneling.  There is still localized edema and erythema present to the distal portion of toe.  There is no fluctuation or crepitation.  There is no malodor. No pain with calf compression, swelling, warmth, erythema            Assessment: Ulceration right heel with ulceration right hallux with likely osteomyelitis; left heel fissure  Plan: -All treatment options discussed with the patient including all alternatives, risks, complications.  -X-rays were obtained and reviewed.  3 views of the right foot were obtained.  There does appear to be cortical changes suggestive of osteomyelitis at this time. -I discussed with him amputation versus limb salvage.  Toe seems to be improving he was to continue with try to save the toe.  He is already on antibiotics.  At the wound care center as well.  He splint was close with any signs or symptoms of worsening infection report to emergency room for any occur. -Blood work ordered today including sed rate, CRP, CBC  Trula Slade DPM

## 2021-12-24 ENCOUNTER — Encounter: Payer: Self-pay | Admitting: Cardiology

## 2021-12-24 ENCOUNTER — Ambulatory Visit: Payer: Medicare Other | Attending: Physician Assistant | Admitting: Cardiology

## 2021-12-24 VITALS — BP 128/84 | HR 84 | Ht 68.0 in | Wt 194.0 lb

## 2021-12-24 DIAGNOSIS — E782 Mixed hyperlipidemia: Secondary | ICD-10-CM | POA: Diagnosis not present

## 2021-12-24 DIAGNOSIS — I4819 Other persistent atrial fibrillation: Secondary | ICD-10-CM | POA: Diagnosis not present

## 2021-12-24 DIAGNOSIS — I739 Peripheral vascular disease, unspecified: Secondary | ICD-10-CM | POA: Diagnosis not present

## 2021-12-24 DIAGNOSIS — I10 Essential (primary) hypertension: Secondary | ICD-10-CM | POA: Diagnosis not present

## 2021-12-24 DIAGNOSIS — R0989 Other specified symptoms and signs involving the circulatory and respiratory systems: Secondary | ICD-10-CM

## 2021-12-24 NOTE — Progress Notes (Signed)
Cardiology Office Note:    Date:  12/24/2021   ID:  [name], DOB Sep 29, 1935, MRN [id number]  PCP:  Lujean Amel, [place] [facility] Cardiologist:  Larae Grooms, MD     Referring MD: Lujean Amel, MD   No chief complaint on file.   History of Present Illness:    [name] is a 86 y.o. male with a hx of PAD with critical limb ischemia (bilateral iliofemoral endarterectomy with bilateral common iliac artery stents on 10/09/2021 by Dr. Virl Cagey)  CVA, HTN, DM2, SDH, AF, ETOH abuse.    Last evaluated by Dr. Irish Lack on 09/29/21 (no previous cardiology f/u since 2016) for preoperative clearance for critical limb ischemia with tissue loss. Noted to be in AF, previously on coumadin but that was stopped due to a hemorrhage in his sclera. Consideration could be given for Eliquis trial but at this visit he was still drinking relatively heavily and had an up coming surgery.   Admitted 10/5-10/8/23 for bilateral iliofemoral endarterectomy with bilateral common iliac artery stents. Recommendations to Continue Plavix, ASA, and Crestor at d/c.   He presents today with his son for a follow up of his AF. Per his son, he has had AF for ~ 8 years. He denies chest pain, palpitations, dyspnea, pnd, orthopnea, n, v, dizziness, syncope, edema, weight gain, or early satiety, tiredness or fatigue. He denies falls but does feel unsteady. He is using a rollator exclusively. He drinks 3 beer every evening. No longer drinks hard liquor. He is going to the wound center tomorrow for continued treatment on two ulcerations on his right foot. Noted spots of dried blood on his socks.     Past Medical History:  Diagnosis Date   Acute encephalopathy 05/09/2014   Alcoholism ([place])    Arthritis    Cerebral infarction due to embolism of left middle cerebral artery ([place])    Diabetes mellitus type 2, controlled ([place]) 05/04/2014   Essential hypertension 05/04/2014   Gout 05/04/2014   Hypertension     New onset atrial fibrillation ([place]) 05/04/2014   Psoas abscess ([place]) 05/04/2014   Psoas abscess, right ([place])    SDH (subdural hematoma) ([place]) 05/12/2014   Sepsis ([place]) 05/04/2014   Septic arthritis of hip ([place]) 05/18/2014   Stenosis of right carotid artery    40-59 percent    Streptococcal bacteremia 05/09/2014    Past Surgical History:  Procedure Laterality Date   ABDOMINAL AORTOGRAM W/LOWER EXTREMITY N/A 09/24/2021   Procedure: ABDOMINAL AORTOGRAM W/LOWER EXTREMITY;  Surgeon: [name], MD;  Location: [facility];  Service: Cardiovascular;  Laterality: N/A;   AORTOGRAM  10/09/2021   Procedure: AORTOGRAM;  Surgeon: [name], MD;  Location: [facility];  Service: Vascular;;   APPLICATION OF WOUND VAC Bilateral 10/09/2021   Procedure: APPLICATION OF WOUND VAC;  Surgeon: [name], MD;  Location: [facility];  Service: Vascular;  Laterality: Bilateral;   BONE BIOPSY Right 08/13/2021   Procedure: BONE BIOPSY;  Surgeon: Trula Slade, DPM;  Location: [facility];  Service: Podiatry;  Laterality: Right;   ENDARTERECTOMY Bilateral 10/09/2021   Procedure: ILIOFEMORAL ENDARTERECTOMY;  Surgeon: [name], MD;  Location: [facility];  Service: Vascular;  Laterality: Bilateral;   HIP ARTHROPLASTY Right    INSERTION OF ILIAC STENT Bilateral 10/09/2021   Procedure: INSERTION OF RETROGRADE ILIAC STENT;  Surgeon: [name], MD;  Location: [facility];  Service: Vascular;  Laterality: Bilateral;   JOINT REPLACEMENT  right hip, bilateral knees   left femur surgery Left    with rod   right cataract extraction     TONSILLECTOMY     TOTAL SHOULDER ARTHROPLASTY Bilateral    ULTRASOUND GUIDANCE FOR VASCULAR ACCESS Bilateral 10/09/2021   Procedure: ULTRASOUND GUIDANCE FOR VASCULAR ACCESS;  Surgeon: [name], MD;  Location: [facility];  Service: Vascular;  Laterality: Bilateral;   WOUND DEBRIDEMENT Right 08/13/2021   Procedure: DEBRIDEMENT WOUND;  Surgeon: Trula Slade, DPM;  Location: [facility];  Service: Podiatry;  Laterality: Right;    Current Medications: Current Meds  Medication Sig   amoxicillin (AMOXIL) 500 MG capsule TAKE 1 CAPSULE BY MOUTH TWICE A DAY   APPLE CIDER VINEGAR PO Take 1 capsule by mouth daily.   aspirin EC 81 MG tablet Take 81 mg by mouth every other day. Swallow whole.   atenolol (TENORMIN) 50 MG tablet Take 50 mg by mouth daily.   cefadroxil (DURICEF) 500 MG capsule Take 1 capsule (500 mg total) by mouth 2 (two) times daily.   clopidogrel (PLAVIX) 75 MG tablet TAKE 1 TABLET (75 MG TOTAL) BY MOUTH DAILY AT 6 (SIX) AM.   doxycycline (VIBRA-TABS) 100 MG tablet Take 1 tablet (100 mg total) by mouth 2 (two) times daily.   fluticasone (FLONASE) 50 MCG/ACT nasal spray Place 2 sprays into both nostrils daily.   furosemide (LASIX) 20 MG tablet Take 20 mg by mouth daily.   hydrALAZINE (APRESOLINE) 25 MG tablet Take 1 tablet (25 mg total) by mouth every 8 (eight) hours.   irbesartan (AVAPRO) 75 MG tablet Take 75 mg by mouth daily.   LYRICA 75 MG capsule Take 75-150 mg by mouth See admin instructions. Take 75 mg by mouth in the morning and 150 mg at night   meloxicam (MOBIC) 7.5 MG tablet Take 7.5 mg by mouth daily.   metFORMIN (GLUCOPHAGE) 1000 MG tablet Take 1,000 mg by mouth in the morning and at bedtime.   Multiple Vitamin (MULTIVITAMIN WITH MINERALS) TABS tablet Take 1 tablet by mouth daily.   neomycin-bacitracin-polymyxin (NEOSPORIN) [phone] ointment Apply 1 Application topically daily as needed (wound care).   OVER THE COUNTER MEDICATION Take 1 capsule by mouth daily. Balance of Nature Fruits   OVER THE COUNTER MEDICATION Take 1 tablet by mouth daily. Beet supplement   protein supplement shake (PREMIER PROTEIN) LIQD Take 11 oz by mouth daily.   rosuvastatin (CRESTOR) 20 MG tablet Take 1 tablet (20 mg total) by mouth daily.   Tetrahydrozoline HCl (VISINE OP) Place 1 drop into both eyes daily as needed (burning).   traMADol (ULTRAM) 50 MG tablet Take 2  tablets (100 mg total) by mouth every 6 (six) hours as needed. Every 4 hours   ULORIC 80 MG TABS Take 80 mg by mouth daily.     Allergies:   Allopurinol, Atorvastatin, and Motrin [ibuprofen]   Social History   Socioeconomic History   Marital status: Divorced    Spouse name: Not on file   Number of children: Not on file   Years of education: Not on file   Highest education level: Not on file  Occupational History   Not on file  Tobacco Use   Smoking status: Former    Packs/day: 25.00    Types: Cigarettes   Smokeless tobacco: Never  Vaping Use   Vaping Use: Never used  Substance and Sexual Activity   Alcohol use: Yes    Comment: 4 beers daily   Drug use: Not  Currently   Sexual activity: Never  Other Topics Concern   Not on file  Social History Narrative   Not on file   Social Determinants of Health   Financial Resource Strain: Not on file  Food Insecurity: No Food Insecurity (10/11/2021)   Hunger Vital Sign    Worried About Running Out of Food in the Last Year: Never true    Ran Out of Food in the Last Year: Never true  Transportation Needs: No Transportation Needs (10/11/2021)   PRAPARE - Hydrologist (Medical): No    Lack of Transportation (Non-Medical): No  Physical Activity: Not on file  Stress: Not on file  Social Connections: Not on file     Family History: The patient's family history includes Diabetes Mellitus II in his daughter.  ROS:   Review of Systems  Constitutional: Negative.   HENT: Negative.    Eyes: Negative.   Respiratory: Negative.    Cardiovascular:  Positive for leg swelling. Negative for chest pain, palpitations, orthopnea and PND.  Gastrointestinal: Negative.   Genitourinary: Negative.   Musculoskeletal:  Negative for falls.  Skin: Negative.   Neurological:  Negative for dizziness.  Endo/Heme/Allergies:  Bruises/bleeds easily.  Psychiatric/Behavioral: Negative.       EKGs/Labs/Other Studies Reviewed:     The following studies were reviewed today:   EKG:  EKG is not ordered today.    Recent Labs: 10/10/2021: BUN 17; Creatinine, Ser 0.66; Potassium 4.2; Sodium 131 12/19/2021: ALT 13; Hemoglobin 11.6; Platelets 258  Recent Lipid Panel    Component Value Date/Time   CHOL 120 12/19/2021 1152   TRIG 82 12/19/2021 1152   HDL 57 12/19/2021 1152   CHOLHDL 2.1 12/19/2021 1152   CHOLHDL 2.3 10/10/2021 0054   VLDL 10 10/10/2021 0054   LDLCALC 47 12/19/2021 1152     Risk Assessment/Calculations:    CHA2DS2-VASc Score = 7   This indicates a 11.2% annual risk of stroke. The patient's score is based upon: CHF History: 0 HTN History: 1 Diabetes History: 1 Stroke History: 2 Vascular Disease History: 1 Age Score: 2 Gender Score: 0               Physical Exam:    VS:  BP 128/84   Pulse 84   Ht '5\' 8"'$  (1.727 m)   Wt 194 lb (88 kg)   SpO2 94%   BMI 29.50 kg/m     Wt Readings from Last 3 Encounters:  12/24/21 194 lb (88 kg)  10/27/21 185 lb (83.9 kg)  10/09/21 180 lb (81.6 kg)    Physical Exam Constitutional:      General: He is not in acute distress.    Appearance: He is ill-appearing. He is not toxic-appearing.  Neck:     Vascular: Carotid bruit (left) present.  Cardiovascular:     Rate and Rhythm: Normal rate. Rhythm irregular.     Heart sounds: No murmur heard. Pulmonary:     Breath sounds: Normal breath sounds.  Abdominal:     General: Abdomen is flat.     Palpations: Abdomen is soft.  Musculoskeletal:     Right lower leg: Edema (much improved per his son) present.     Left lower leg: Edema present.  Skin:    General: Skin is warm and dry.     Findings: Erythema (LLE, mid shin down, much improved per his son) present.  Neurological:     Mental Status: He is oriented to person, place, and  time.  Psychiatric:        Mood and Affect: Mood normal.      ASSESSMENT:    1. Persistent atrial fibrillation ([place])   2. PAD (peripheral artery disease) ([place])    3. Mixed hyperlipidemia   4. Bruit   5. Essential hypertension    PLAN:    In order of problems listed above:  Persistent AF - CHA2DS2-VASc Score = 7 Irregular rhythm noted today. He is not aware that he is in AF. Currently on Plavix and ASA per VVS. Considered adding DOAC, however he is still having mobility issues. He relies solely on a rollator now. He denies falls, but he is unsteady. He has stopped drinking hard liquor but consumes three beers every evening. He is at a high risk for falls. Currently on ASA 81 mg every other day, plavix 75 mg daily per [facility]. His legs are much improved per his son. He does have two new sores that are reportedly healing well. Noted spots of blood on his socks. Will reach out to Dr. Virl Cagey to see what the planned duration of Plavix is. Overall, currently, he is not a good candidate for anticoagulation. If/when he is a candidate, it would be best to start anticoagulation once he can be on antiplatelet monotherapy to reduce bleeding risk.  PAD - He reports his legs are much better and edema is much improved. Two new ulcerations on his right foot that he is going to the wound center for. Currently on ASA 81 mg every other day, plavix 75 mg daily per [facility].  Hyperlipidemia - LDL on 12/19/21 47, well controlled. Continue rosuvastatin 20 mg daily.  Bruit - noted over left carotid. Previous US carotid 2016, Right: 40-59% ICA stenosis. Bilateral: Vertebral artery flow is antegrade. Left: ICA not adequately evaluated because the patient  became agitated. Will order carotid vascular US. LDL currently well controlled.  HTN - BP today well controlled 128/84. Continue atenolol 50 mg daily, lasix 20 mg daily, hydralazine 25 mg three times/day, irbesartan 75 mg daily.            Medication Adjustments/Labs and Tests Ordered: Current medicines are reviewed at length with the patient today.  Concerns regarding medicines are outlined above.  Orders Placed This Encounter   Procedures   VAS US CAROTID   No orders of the defined types were placed in this encounter.   Patient Instructions  Medication Instructions:  Your physician recommends that you continue on your current medications as directed. Please refer to the Current Medication list given to you today.  *If you need a refill on your cardiac medications before your next appointment, please call your pharmacy*   Lab Work: None ordered  If you have labs (blood work) drawn today and your tests are completely normal, you will receive your results only by: [place] (if you have MyChart) OR A paper copy in the mail If you have any lab test that is abnormal or we need to change your treatment, we will call you to review the results.   Testing/Procedures: Your physician has requested that you have a carotid duplex. This test is an ultrasound of the carotid arteries in your neck. It looks at blood flow through these arteries that supply the brain with blood. Allow one hour for this exam. There are no restrictions or special instructions.    Follow-Up: At [facility], you and your health needs are our priority.  As part of our continuing mission to  provide you with exceptional heart care, we have created designated Provider Care Teams.  These Care Teams include your primary Cardiologist (physician) and Advanced Practice Providers (APPs -  Physician Assistants and Nurse Practitioners) who all work together to provide you with the care you need, when you need it.  We recommend signing up for the patient portal called "MyChart".  Sign up information is provided on this After Visit Summary.  MyChart is used to connect with patients for Virtual Visits (Telemedicine).  Patients are able to view lab/test results, encounter notes, upcoming appointments, etc.  Non-urgent messages can be sent to your provider as well.   To learn more about what you can do with MyChart, go to [email or web address].     Your next appointment:   3 month(s)  The format for your next appointment:   In Person  Provider:   Larae Grooms, MD     Other Instructions   Important Information About Sugar         Signed, Trudi Ida, NP  12/24/2021 3:41 PM    [place]

## 2021-12-24 NOTE — Patient Instructions (Signed)
Medication Instructions:  Your physician recommends that you continue on your current medications as directed. Please refer to the Current Medication list given to you today.  *If you need a refill on your cardiac medications before your next appointment, please call your pharmacy*   Lab Work: None ordered  If you have labs (blood work) drawn today and your tests are completely normal, you will receive your results only by: [place] (if you have MyChart) OR A paper copy in the mail If you have any lab test that is abnormal or we need to change your treatment, we will call you to review the results.   Testing/Procedures: Your physician has requested that you have a carotid duplex. This test is an ultrasound of the carotid arteries in your neck. It looks at blood flow through these arteries that supply the brain with blood. Allow one hour for this exam. There are no restrictions or special instructions.    Follow-Up: At [facility], you and your health needs are our priority.  As part of our continuing mission to provide you with exceptional heart care, we have created designated Provider Care Teams.  These Care Teams include your primary Cardiologist (physician) and Advanced Practice Providers (APPs -  Physician Assistants and Nurse Practitioners) who all work together to provide you with the care you need, when you need it.  We recommend signing up for the patient portal called "MyChart".  Sign up information is provided on this After Visit Summary.  MyChart is used to connect with patients for Virtual Visits (Telemedicine).  Patients are able to view lab/test results, encounter notes, upcoming appointments, etc.  Non-urgent messages can be sent to your provider as well.   To learn more about what you can do with MyChart, go to [email or web address].    Your next appointment:   3 month(s)  The format for your next appointment:   In Person  Provider:   Larae Grooms, MD     Other Instructions   Important Information About Sugar

## 2021-12-25 ENCOUNTER — Encounter (HOSPITAL_BASED_OUTPATIENT_CLINIC_OR_DEPARTMENT_OTHER): Payer: Medicare Other | Admitting: Internal Medicine

## 2021-12-25 DIAGNOSIS — E1151 Type 2 diabetes mellitus with diabetic peripheral angiopathy without gangrene: Secondary | ICD-10-CM | POA: Diagnosis not present

## 2021-12-25 DIAGNOSIS — E11621 Type 2 diabetes mellitus with foot ulcer: Secondary | ICD-10-CM | POA: Diagnosis not present

## 2021-12-25 DIAGNOSIS — E1142 Type 2 diabetes mellitus with diabetic polyneuropathy: Secondary | ICD-10-CM | POA: Diagnosis not present

## 2021-12-25 DIAGNOSIS — L97918 Non-pressure chronic ulcer of unspecified part of right lower leg with other specified severity: Secondary | ICD-10-CM | POA: Diagnosis not present

## 2021-12-25 DIAGNOSIS — M86171 Other acute osteomyelitis, right ankle and foot: Secondary | ICD-10-CM | POA: Diagnosis not present

## 2021-12-25 DIAGNOSIS — L97514 Non-pressure chronic ulcer of other part of right foot with necrosis of bone: Secondary | ICD-10-CM | POA: Diagnosis not present

## 2021-12-25 DIAGNOSIS — I4891 Unspecified atrial fibrillation: Secondary | ICD-10-CM | POA: Diagnosis not present

## 2021-12-25 DIAGNOSIS — I739 Peripheral vascular disease, unspecified: Secondary | ICD-10-CM | POA: Diagnosis not present

## 2021-12-25 DIAGNOSIS — Z833 Family history of diabetes mellitus: Secondary | ICD-10-CM | POA: Diagnosis not present

## 2021-12-25 DIAGNOSIS — Z87891 Personal history of nicotine dependence: Secondary | ICD-10-CM | POA: Diagnosis not present

## 2021-12-25 DIAGNOSIS — E1169 Type 2 diabetes mellitus with other specified complication: Secondary | ICD-10-CM | POA: Diagnosis not present

## 2021-12-25 NOTE — Progress Notes (Signed)
[name] ([id number]) [id number] Page 1 of 8 Visit Report for 12/25/2021 Chief Complaint Document Details Patient Name: Date of Service: [name], Kenneth MES L. 12/25/2021 1:15 PM Medical Record Number: [id number] Patient Account Number: [id number] Date of Birth/Sex: Treating RN: 06/09/1935 (86 y.o. M) Primary Care Provider: Dorthy Cooler, [place] Other Clinician: Referring Provider: Treating Provider/Extender: Alanda Slim, [place] Weeks in Treatment: 17 Information Obtained from: Patient Chief Complaint 08/26/2021; right foot wound Electronic Signature(s) Signed: 12/25/2021 4:16:51 PM By: Kalman Shan DO Entered By: Kalman Shan on 12/25/2021 14:15:32 -------------------------------------------------------------------------------- HPI Details Patient Name: Date of Service: Kenneth Mons MES L. 12/25/2021 1:15 PM Medical Record Number: [id number] Patient Account Number: [id number] Date of Birth/Sex: Treating RN: 1935/08/23 (86 y.o. M) Primary Care Provider: Dorthy Cooler, [place] Other Clinician: Referring Provider: Treating Provider/Extender: Alanda Slim, [place] Weeks in Treatment: 17 History of Present Illness HPI Description: Admission 08/26/2021 [name] is an 86 year old male with a past medical history of controlled type 2 diabetes on oral agents with peripheral neuropathy and Atrial fibrillation not on blood thinners that presents to the clinic with a right foot wound that has waxed and waned in healing over the past year. He has peripheral neuropathy and is not sure when the wound started or how it started. He had an MRI done by his primary care physician on 08/03/2021 that showed potential early acute osteomyelitis at the posterior calcaneus. He was seen by Dr. Linus Salmons on 08/06/2021 and started on doxycycline and cefadroxil due to these findings. He was evaluated by podiatry, Dr. Jacqualyn Posey and had a bone biopsy done on 08/13/2021 that did not  show evidence of osteomyelitis. He had a culture that showed MRSA sensitive to doxycycline. He also had ABIs with TBI's done on 8/2 that showed an ABI of 0.58 and a noncompressible TBI. He is scheduled to see vein and vascular on 8/30. Currently he is keeping the wound covered. He is using a soft surgical shoe. He denies signs of infection. 8/5; patient presents for follow-up. He had follow-up with Dr. Linus Salmons On 8/23. Plan is for 6 weeks of doxycycline through September 16. Although path was negative for osteo there is still concern due to positive culture from the bone. Unfortunately patient missed his vein and vascular appointment to assess blood flow status. This has been rescheduled for 9/8. Patient has been using Medihoney to the wound bed. He tries to offload the wound bed but has trouble at night keeping pressure off of it. He does not have Prevalon boots. He currently denies signs of infection. 9/19; patient presents for follow-up. He is scheduled to have a bilateral lower extremity angiogram on 9/20. He currently denies signs of infection. He has been using Medihoney to the wound bed. 11/13; patient has missed his last follow-up. In fact he has not been here in 2 months. He had bilateral iliofemoral endarterectomy with retrograde iliac stenting on 10/09/2021. He has been using Medihoney with Hydrofera Blue to the heel wound. He reports improvement in wound healing here. 2 weeks ago he developed a wound to the dorsal aspect of the great toe. He states His shoes cause this issue and he is currently wearing different shoes. He is on clindamycin as there was concern for infection to the right great toe. He currently denies signs of infection. He has been keeping the great toe open to air. 12/4; patient presents for follow-up. He has been using Medihoney and Hydrofera Blue to the heel wound and right great toe wound. The heel wound  is closed. The right great toe wound has bone exposed. He now has a  new wound to the right ankle that he states he used to shoehorn to scratch the area and created the wound. He has not been dressing this area. 12/21; patient presents for follow-up. He has been using Hydrofera Blue and Medihoney to the right anterior leg wound and right toe wound. There is been improvement in wound healing. He has been taking doxycycline prescribed at last clinic visit. He has no issues or complaints today. [name], [name] ([id number]) [id number] Page 2 of 8 Electronic Signature(s) Signed: 12/25/2021 4:16:51 PM By: Kalman Shan DO Entered By: Kalman Shan on 12/25/2021 14:16:29 -------------------------------------------------------------------------------- Physical Exam Details Patient Name: Date of Service: Kenneth Mons MES L. 12/25/2021 1:15 PM Medical Record Number: [id number] Patient Account Number: [id number] Date of Birth/Sex: Treating RN: [birth year] (86 y.o. M) Primary Care Provider: Dorthy Cooler, [place] Other Clinician: Referring Provider: Treating Provider/Extender: Alanda Slim, Dibas Weeks in Treatment: 17 Constitutional respirations regular, non-labored and within target range for patient.. Cardiovascular 2+ dorsalis pedis/posterior tibialis pulses. Psychiatric pleasant and cooperative. Notes T the dorsal aspect of the right great toe there is an open wound with granulation tissue and fibrinous tissue. No bone exposed. T the anterior ankle crease o o there is an open wound with nonviable tissue throughout tightly adhered. No signs of infection. Electronic Signature(s) Signed: 12/25/2021 4:16:51 PM By: Kalman Shan DO Entered By: Kalman Shan on 12/25/2021 14:17:10 -------------------------------------------------------------------------------- Physician Orders Details Patient Name: Date of Service: Kenneth Mons MES L. 12/25/2021 1:15 PM Medical Record Number: [id number] Patient Account Number: [id number] Date  of Birth/Sex: Treating RN: 1935-11-10 (86 y.o. [name] Primary Care Provider: Dorthy Cooler, [place] Other Clinician: Referring Provider: Treating Provider/Extender: Alanda Slim, Dibas Weeks in Treatment: 5 Verbal / Phone Orders: No Diagnosis Coding Follow-up Appointments ppointment in 2 weeks. - w/ Dr. Heber [place] Return A Anesthetic (In clinic) Topical Lidocaine 5% applied to wound bed (In clinic) Topical Lidocaine 4% applied to wound bed Cellular or Tissue Based Products Other Cellular or Tissue Based Products Orders/Instructions: - Will run IVR for Organogenesis products. Bathing/ Shower/ Hygiene May shower and wash wound with soap and water. Edema Control - Lymphedema / SCD / Other Avoid standing for long periods of time. Moisturize legs daily. [name], [name] ([id number]) [id number] Page 3 of 8 Off-Loading Open toe surgical shoe to: - Wear socks with your shoes Other: - -Keep pressure off area as much as is possible -Will order Prevalon Boot Additional Orders / Instructions Follow Nutritious Diet Wound Treatment Wound #2 - T Great oe Wound Laterality: Right Cleanser: Soap and Water 1 x Per Day/7 Days Discharge Instructions: May shower and wash wound with dial antibacterial soap and water prior to dressing change. Peri-Wound Care: Skin Prep (Generic) 1 x Per Day/7 Days Discharge Instructions: Use skin prep as directed Prim Dressing: MediHoney Gel, tube 1.5 (oz) 1 x Per Day/7 Days ary Discharge Instructions: Apply to wound bed as instructed Prim Dressing: Hydrofera Blue Ready Foam, 2.5 x2.5 in (Generic) 1 x Per Day/7 Days ary Discharge Instructions: Apply to wound bed as instructed Secondary Dressing: ALLEVYN Gentle Border, 3x3 (in/in) (Dispense As Written) 1 x Per Day/7 Days Discharge Instructions: Apply over primary dressing as directed. Compression Wrap: tubi grip size E 1 x Per Day/7 Days Add-Ons: Cotton Tip Swabs (Generic)  1 x Per Day/7 Days Wound #3 - Ankle Wound Laterality: Right, Anterior Cleanser: Soap and Water 1 x Per Day/7 Days Discharge Instructions: May  shower and wash wound with dial antibacterial soap and water prior to dressing change. Peri-Wound Care: Skin Prep (Generic) 1 x Per Day/7 Days Discharge Instructions: Use skin prep as directed Prim Dressing: MediHoney Gel, tube 1.5 (oz) 1 x Per Day/7 Days ary Discharge Instructions: Apply to wound bed as instructed Prim Dressing: Hydrofera Blue Ready Foam, 2.5 x2.5 in (Generic) 1 x Per Day/7 Days ary Discharge Instructions: Apply to wound bed as instructed Secondary Dressing: ABD Pad, 5x9 1 x Per Day/7 Days Discharge Instructions: Apply over primary dressing as directed. Secured With: [employer], 4.5x3.1 (in/yd) 1 x Per Day/7 Days Discharge Instructions: Secure with Kerlix as directed. Secured With: 31M Medipore H Soft Cloth Surgical T ape, 4 x 10 (in/yd) 1 x Per Day/7 Days Discharge Instructions: Secure with tape as directed. Compression Wrap: tubi grip size E 1 x Per Day/7 Days Add-Ons: Cotton Tip Swabs (Generic) 1 x Per Day/7 Days Patient Medications llergies: allopurinol, atorvastatin, ibuprofen A Notifications Medication Indication Start End 12/25/2021 doxycycline hyclate DOSE 1 - oral 100 mg tablet - 1 tablet oral twice a day x 14 days Electronic Signature(s) Signed: 12/25/2021 2:39:51 PM By: Kalman Shan DO Entered By: Kalman Shan on 12/25/2021 14:39:50 [name], [name] ([id number]) [id number] Page 4 of 8 -------------------------------------------------------------------------------- Problem List Details Patient Name: Date of Service: AVELINO, HERREN MES L. 12/25/2021 1:15 PM Medical Record Number: [id number] Patient Account Number: [id number] Date of Birth/Sex: Treating RN: 13-Jul-1935 (86 y.o. M) Primary Care Provider: Dorthy Cooler, [place] Other Clinician: Referring Provider: Treating  Provider/Extender: Alanda Slim, Dibas Weeks in Treatment: 17 Active Problems ICD-10 Encounter Code Description Active Date MDM Diagnosis E11.621 Type 2 diabetes mellitus with foot ulcer 08/26/2021 No Yes I73.9 Peripheral vascular disease, unspecified 08/26/2021 No Yes M86.171 Other acute osteomyelitis, right ankle and foot 08/26/2021 No Yes E11.40 Type 2 diabetes mellitus with diabetic neuropathy, unspecified 08/26/2021 No Yes L97.514 Non-pressure chronic ulcer of other part of right foot with necrosis of bone 12/08/2021 No Yes L97.918 Non-pressure chronic ulcer of unspecified part of right lower leg with other 12/25/2021 No Yes specified severity Inactive Problems Resolved Problems Electronic Signature(s) Signed: 12/25/2021 4:16:51 PM By: Kalman Shan DO Entered By: Kalman Shan on 12/25/2021 14:20:26 -------------------------------------------------------------------------------- Progress Note Details Patient Name: Date of Service: Kenneth Mons MES L. 12/25/2021 1:15 PM Medical Record Number: [id number] Patient Account Number: [id number] Date of Birth/Sex: Treating RN: December 09, 1935 (86 y.o. M) Primary Care Provider: Dorthy Cooler, [place] Other Clinician: Referring Provider: Treating Provider/Extender: Alanda Slim, Dibas Weeks in Treatment: [address] [name], [name] ([id number]) [id number] Page 5 of 8 Information obtained from Patient 08/26/2021; right foot wound History of Present Illness (HPI) Admission 08/26/2021 [name] is an 86 year old male with a past medical history of controlled type 2 diabetes on oral agents with peripheral neuropathy and Atrial fibrillation not on blood thinners that presents to the clinic with a right foot wound that has waxed and waned in healing over the past year. He has peripheral neuropathy and is not sure when the wound started or how it started. He had an MRI done by his  primary care physician on 08/03/2021 that showed potential early acute osteomyelitis at the posterior calcaneus. He was seen by Dr. Linus Salmons on 08/06/2021 and started on doxycycline and cefadroxil due to these findings. He was evaluated by podiatry, Dr. Jacqualyn Posey and had a bone biopsy done on 08/13/2021 that did not show evidence of osteomyelitis. He had a culture that showed MRSA sensitive to doxycycline. He also  had ABIs with TBI's done on 8/2 that showed an ABI of 0.58 and a noncompressible TBI. He is scheduled to see vein and vascular on 8/30. Currently he is keeping the wound covered. He is using a soft surgical shoe. He denies signs of infection. 8/5; patient presents for follow-up. He had follow-up with Dr. Linus Salmons On 8/23. Plan is for 6 weeks of doxycycline through September 16. Although path was negative for osteo there is still concern due to positive culture from the bone. Unfortunately patient missed his vein and vascular appointment to assess blood flow status. This has been rescheduled for 9/8. Patient has been using Medihoney to the wound bed. He tries to offload the wound bed but has trouble at night keeping pressure off of it. He does not have Prevalon boots. He currently denies signs of infection. 9/19; patient presents for follow-up. He is scheduled to have a bilateral lower extremity angiogram on 9/20. He currently denies signs of infection. He has been using Medihoney to the wound bed. 11/13; patient has missed his last follow-up. In fact he has not been here in 2 months. He had bilateral iliofemoral endarterectomy with retrograde iliac stenting on 10/09/2021. He has been using Medihoney with Hydrofera Blue to the heel wound. He reports improvement in wound healing here. 2 weeks ago he developed a wound to the dorsal aspect of the great toe. He states His shoes cause this issue and he is currently wearing different shoes. He is on clindamycin as there was concern for infection to the right  great toe. He currently denies signs of infection. He has been keeping the great toe open to air. 12/4; patient presents for follow-up. He has been using Medihoney and Hydrofera Blue to the heel wound and right great toe wound. The heel wound is closed. The right great toe wound has bone exposed. He now has a new wound to the right ankle that he states he used to shoehorn to scratch the area and created the wound. He has not been dressing this area. 12/21; patient presents for follow-up. He has been using Hydrofera Blue and Medihoney to the right anterior leg wound and right toe wound. There is been improvement in wound healing. He has been taking doxycycline prescribed at last clinic visit. He has no issues or complaints today. Patient History Information obtained from Patient, Chart. Family History Diabetes - Child, Stroke - Mother, No family history of Cancer, Heart Disease, Hereditary Spherocytosis, Hypertension, Kidney Disease, Lung Disease, Seizures, Thyroid Problems, Tuberculosis. Social History Former smoker, Marital Status - Divorced, Alcohol Use - Moderate, Drug Use - Prior History, Caffeine Use - Daily. Medical History Eyes Patient has history of Cataracts - Surgery Cardiovascular Patient has history of Angina - AFib Endocrine Patient has history of Type II Diabetes Musculoskeletal Patient has history of Gout, Osteoarthritis Denies history of Rheumatoid Arthritis, Osteomyelitis Neurologic Patient has history of Neuropathy Hospitalization/Surgery History - inpatient 09/2021 arterial stenting on legs.. Medical A Surgical History Notes nd Cardiovascular Hx CVA Objective Constitutional respirations regular, non-labored and within target range for patient.. Vitals Time Taken: 1:40 PM, Height: 68 in, Weight: 198 lbs, BMI: 30.1, Temperature: 98.1 F, Pulse: 82 bpm, Respiratory Rate: 18 breaths/min, Blood Pressure: 117/68 mmHg. Cardiovascular 2+ dorsalis pedis/posterior  tibialis pulses. Psychiatric pleasant and cooperative. [name], [name] ([id number]) [id number] Page 6 of 8 General Notes: T the dorsal aspect of the right great toe there is an open wound with granulation tissue and fibrinous tissue. No bone exposed. T the anterior  o o ankle crease there is an open wound with nonviable tissue throughout tightly adhered. No signs of infection. Integumentary (Hair, Skin) Wound #2 status is Open. Original cause of wound was Trauma. The date acquired was: 11/10/2021. The wound has been in treatment 5 weeks. The wound is located on the Right T Great. The wound measures 0.2cm length x 0.3cm width x 0.1cm depth; 0.047cm^2 area and 0.005cm^3 volume. There is bone and oe Fat Layer (Subcutaneous Tissue) exposed. There is no tunneling noted. There is a medium amount of serosanguineous drainage noted. The wound margin is distinct with the outline attached to the wound base. There is large (67-100%) red, pink granulation within the wound bed. There is a small (1-33%) amount of necrotic tissue within the wound bed including Adherent Slough. The periwound skin appearance exhibited: Dry/Scaly. The periwound skin appearance did not exhibit: Callus, Crepitus, Excoriation, Induration, Rash, Scarring, Maceration, Atrophie Blanche, Cyanosis, Ecchymosis, Hemosiderin Staining, Mottled, Pallor, Rubor, Erythema. Wound #3 status is Open. Original cause of wound was Shear/Friction. The date acquired was: 12/04/2021. The wound has been in treatment 2 weeks. The wound is located on the Right,Anterior Ankle. The wound measures 1.3cm length x 0.6cm width x 0.1cm depth; 0.613cm^2 area and 0.061cm^3 volume. There is no tunneling or undermining noted. There is a none present amount of drainage noted. The wound margin is distinct with the outline attached to the wound base. There is a large (67-100%) amount of necrotic tissue within the wound bed including Eschar. The  periwound skin appearance exhibited: Dry/Scaly. The periwound skin appearance did not exhibit: Callus, Crepitus, Excoriation, Induration, Rash, Scarring, Maceration, Atrophie Blanche, Cyanosis, Ecchymosis, Hemosiderin Staining, Mottled, Pallor, Rubor, Erythema. Assessment Active Problems ICD-10 Type 2 diabetes mellitus with foot ulcer Peripheral vascular disease, unspecified Other acute osteomyelitis, right ankle and foot Type 2 diabetes mellitus with diabetic neuropathy, unspecified Non-pressure chronic ulcer of other part of right foot with necrosis of bone Patient's wounds have shown improvement in size and appearance since last clinic visit. I recommended continuing the course with Medihoney and Hydrofera Blue. He no longer has exposed bone to the right great toe. He has completed 2 weeks of doxycycline and I recommended 2 additional weeks for total 4 weeks. 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 Plan Follow-up Appointments: Return Appointment in 2 weeks. - w/ Dr. Heber [place] Anesthetic: (In clinic) Topical Lidocaine 5% applied to wound bed (In clinic) Topical Lidocaine 4% applied to wound bed Cellular or Tissue Based Products: Other Cellular or Tissue Based Products Orders/Instructions: - Will run IVR for Organogenesis products. Bathing/ Shower/ Hygiene: May shower and wash wound with soap and water. Edema Control - Lymphedema / SCD / Other: Avoid standing for long periods of time. Moisturize legs daily. Off-Loading: Open toe surgical shoe to: - Wear socks with your shoes Other: - -Keep pressure off area as much as is possible -Will order Prevalon Boot Additional Orders / Instructions: Follow Nutritious Diet WOUND #2: - T Great Wound Laterality: Right oe Cleanser: Soap and Water 1 x Per Day/7 Days Discharge Instructions: May shower and wash wound with dial antibacterial soap and water prior to dressing change. Peri-Wound Care: Skin Prep (Generic) 1 x  Per Day/7 Days Discharge Instructions: Use skin prep as directed Prim Dressing: MediHoney Gel, tube 1.5 (oz) 1 x Per Day/7 Days ary Discharge Instructions: Apply to wound bed as instructed Prim Dressing: Hydrofera Blue Ready Foam, 2.5 x2.5 in (Generic) 1 x Per Day/7 Days ary Discharge Instructions: Apply  to wound bed as instructed Secondary Dressing: ALLEVYN Gentle Border, 3x3 (in/in) (Dispense As Written) 1 x Per Day/7 Days Discharge Instructions: Apply over primary dressing as directed. Com pression Wrap: tubi grip size E 1 x Per Day/7 Days Add-Ons: Cotton Tip Swabs (Generic) 1 x Per Day/7 Days WOUND #3: - Ankle Wound Laterality: Right, Anterior Cleanser: Soap and Water 1 x Per Day/7 Days Discharge Instructions: May shower and wash wound with dial antibacterial soap and water prior to dressing change. Peri-Wound Care: Skin Prep (Generic) 1 x Per Day/7 Days Discharge Instructions: Use skin prep as directed Prim Dressing: MediHoney Gel, tube 1.5 (oz) 1 x Per Day/7 Days ary Discharge Instructions: Apply to wound bed as instructed Prim Dressing: Hydrofera Blue Ready Foam, 2.5 x2.5 in (Generic) 1 x Per Day/7 Days ary Discharge Instructions: Apply to wound bed as instructed Secondary Dressing: ABD Pad, 5x9 1 x Per Day/7 Days Discharge Instructions: Apply over primary dressing as directed. Secured With: [employer], 4.5x3.1 (in/yd) 1 x Per Day/7 Days Discharge Instructions: Secure with Kerlix as directed. Secured With: 53M Medipore H Soft Cloth Surgical T ape, 4 x 10 (in/yd) 1 x Per Day/7 Days Discharge Instructions: Secure with tape as directed. [name], [name] ([id number]) [id number] Page 7 of 8 Compression Wrap: tubi grip size E 1 x Per Day/7 Days Add-Ons: Cotton Tip Swabs (Generic) 1 x Per Day/7 Days 1. Medihoney and Hydrofera Blue 2. Continue doxycycline 3. Continue padding to the right great toe to help with offloading. 4. Follow-up in 2  weeks Electronic Signature(s) Signed: 12/25/2021 4:16:51 PM By: Kalman Shan DO Entered By: Kalman Shan on 12/25/2021 14:19:14 -------------------------------------------------------------------------------- HxROS Details Patient Name: Date of Service: Kenneth Mons MES L. 12/25/2021 1:15 PM Medical Record Number: [id number] Patient Account Number: [id number] Date of Birth/Sex: Treating RN: Mar 03, 1935 (86 y.o. M) Primary Care Provider: Dorthy Cooler, [place] Other Clinician: Referring Provider: Treating Provider/Extender: Alanda Slim, Dibas Weeks in Treatment: 17 Information Obtained From Patient Chart Eyes Medical History: Positive for: Cataracts - Surgery Cardiovascular Medical History: Positive for: Angina - AFib Past Medical History Notes: Hx CVA Endocrine Medical History: Positive for: Type II Diabetes Treated with: Oral agents, Diet Blood sugar tested every day: No Musculoskeletal Medical History: Positive for: Gout; Osteoarthritis Negative for: Rheumatoid Arthritis; Osteomyelitis Neurologic Medical History: Positive for: Neuropathy HBO Extended History Items Eyes: Cataracts Immunizations Pneumococcal Vaccine: Received Pneumococcal Vaccination: No Implantable Devices None [name], [name] ([id number]) [id number] Page 8 of 8 Hospitalization / Surgery History Type of Hospitalization/Surgery inpatient 09/2021 arterial stenting on legs. Family and Social History Cancer: No; Diabetes: Yes - Child; Heart Disease: No; Hereditary Spherocytosis: No; Hypertension: No; Kidney Disease: No; Lung Disease: No; Seizures: No; Stroke: Yes - Mother; Thyroid Problems: No; Tuberculosis: No; Former smoker; Marital Status - Divorced; Alcohol Use: Moderate; Drug Use: Prior History; Caffeine Use: Daily; Financial Concerns: No; Food, Clothing or Shelter Needs: No; Support System Lacking: No; Transportation Concerns: No Electronic Signature(s) Signed:  12/25/2021 4:16:51 PM By: Kalman Shan DO Entered By: Kalman Shan on 12/25/2021 14:16:35 -------------------------------------------------------------------------------- SuperBill Details Patient Name: Date of Service: Kenneth Mons MES L. 12/25/2021 Medical Record Number: [id number] Patient Account Number: [id number] Date of Birth/Sex: Treating RN: 1935/08/14 (86 y.o. 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 with other specified severity Facility Procedures : CPT4 Code: 14436016 [id number] Description: 9214 - WOUND CARE VISIT-LEV 4 EST PT Modifier: Quantity: 1 Physician Procedures : CPT4 Code Description Modifier [id number] - WC PHYS LEVEL 3 - EST PT ICD-10 Diagnosis Description L97.514 Non-pressure chronic ulcer of other part of right foot with necrosis of bone L97.918 Non-pressure chronic ulcer of unspecified part of right  lower leg with other specified severity I73.9 Peripheral vascular disease, unspecified E11.621 Type 2 diabetes mellitus with foot ulcer Quantity: 1 Electronic Signature(s) Signed: 12/25/2021 4:16:51 PM By: Kalman Shan DO Entered By: Kalman Shan on 12/25/2021 14:20:54

## 2021-12-31 ENCOUNTER — Ambulatory Visit (HOSPITAL_COMMUNITY)
Admission: RE | Admit: 2021-12-31 | Payer: Medicare Other | Source: Ambulatory Visit | Attending: Cardiology | Admitting: Cardiology

## 2022-01-01 ENCOUNTER — Ambulatory Visit (HOSPITAL_COMMUNITY)
Admission: RE | Admit: 2022-01-01 | Discharge: 2022-01-01 | Disposition: A | Discharge status: Home / Self Care | Payer: Medicare Other | Source: Ambulatory Visit | Attending: Cardiology | Admitting: Cardiology

## 2022-01-01 DIAGNOSIS — R0989 Other specified symptoms and signs involving the circulatory and respiratory systems: Secondary | ICD-10-CM | POA: Insufficient documentation

## 2022-01-02 DIAGNOSIS — L97412 Non-pressure chronic ulcer of right heel and midfoot with fat layer exposed: Secondary | ICD-10-CM | POA: Diagnosis not present

## 2022-01-08 NOTE — Progress Notes (Signed)
DANIS, PEMBLETON ([id number]) [id number] Page 1 of 10 Visit Report for 12/25/2021 Arrival Information Details Patient Name: Date of Service: [name], Kenneth MES Hood. 12/25/2021 1:15 PM Medical Record Number: [id number] Patient Account Number: [id number] Date of Birth/Sex: Treating RN: Aug 24, 1935 (88 y.o. M) Primary Care Teara Duerksen: Dorthy Cooler, Dibas Other Clinician: Referring Holmes Hays: Treating Gwendlyon Zumbro/Extender: Alanda Slim, Dibas Weeks in Treatment: 67 Visit Information History Since Last Visit Added or deleted any medications: Yes Patient Arrived: Walker Any new allergies or adverse reactions: No Arrival Time: 13:38 Had a fall or experienced change in No Accompanied By: son activities of daily living that may affect Transfer Assistance: None risk of falls: Patient Identification Verified: Yes Signs or symptoms of abuse/neglect since last visito No Secondary Verification Process Completed: Yes Hospitalized since last visit: No Patient Requires Transmission-Based Precautions: No Implantable device outside of the clinic excluding No Patient Has Alerts: Yes cellular tissue based products placed in the center Patient Alerts: Patient on Blood Thinner since last visit: ABI R=.58 TBI R=[place] Has Dressing in Place as Prescribed: No Pain Present Now: No Electronic Signature(s) Signed: 12/26/2021 8:46:25 AM By: Erenest Blank Entered By: Erenest Blank on 12/25/2021 13:38:49 -------------------------------------------------------------------------------- Clinic Level of Care Assessment Details Patient Name: Date of Service: [name], Kenneth MES Hood. 12/25/2021 1:15 PM Medical Record Number: [id number] Patient Account Number: [id number] Date of Birth/Sex: Treating RN: 10-23-1935 (87 y.o. [name] Primary Care Kyrin Gratz: Dorthy Cooler, Dibas Other Clinician: Referring Khalidah Herbold: Treating Onnika Siebel/Extender: Alanda Slim, Dibas Weeks in Treatment: 17 Clinic  Level of Care Assessment Items TOOL 4 Quantity Score X- 1 0 Use when only an EandM is performed on FOLLOW-UP visit ASSESSMENTS - Nursing Assessment / Reassessment X- 1 10 Reassessment of Co-morbidities (includes updates in patient status) X- 1 5 Reassessment of Adherence to Treatment Plan ASSESSMENTS - Wound and Skin A ssessment / Reassessment '[]'$  - 0 Simple Wound Assessment / Reassessment - one wound X- 2 5 Complex Wound Assessment / Reassessment - multiple wounds '[]'$  - 0 Dermatologic / Skin Assessment (not related to wound area) ASSESSMENTS - Focused Assessment X- 1 5 Circumferential Edema Measurements - multi extremities '[]'$  - 0 Nutritional Assessment / Counseling / Intervention [name], [name] ([id number]) [id number] Page 2 of 10 '[]'$  - 0 Lower Extremity Assessment (monofilament, tuning fork, pulses) '[]'$  - 0 Peripheral Arterial Disease Assessment (using hand held doppler) ASSESSMENTS - Ostomy and/or Continence Assessment and Care '[]'$  - 0 Incontinence Assessment and Management '[]'$  - 0 Ostomy Care Assessment and Management (repouching, etc.) PROCESS - Coordination of Care '[]'$  - 0 Simple Patient / Family Education for ongoing care X- 1 20 Complex (extensive) Patient / Family Education for ongoing care X- 1 10 Staff obtains Programmer, systems, Records, T Results / Process Orders est '[]'$  - 0 Staff telephones HHA, Nursing Homes / Clarify orders / etc '[]'$  - 0 Routine Transfer to another Facility (non-emergent condition) '[]'$  - 0 Routine Hospital Admission (non-emergent condition) '[]'$  - 0 New Admissions / Biomedical engineer / Ordering NPWT Apligraf, etc. , '[]'$  - 0 Emergency Hospital Admission (emergent condition) X- 1 10 Simple Discharge Coordination '[]'$  - 0 Complex (extensive) Discharge Coordination PROCESS - Special Needs '[]'$  - 0 Pediatric / Minor Patient Management '[]'$  - 0 Isolation Patient Management '[]'$  - 0 Hearing / Language / Visual special needs '[]'$  -  0 Assessment of Community assistance (transportation, D/C planning, etc.) '[]'$  - 0 Additional assistance / Altered mentation '[]'$  - 0 Support Surface(s) Assessment (bed, cushion, seat, etc.) INTERVENTIONS - Wound Cleansing / Measurement '[]'$  -  0 Simple Wound Cleansing - one wound X- 2 5 Complex Wound Cleansing - multiple wounds X- 1 5 Wound Imaging (photographs - any number of wounds) '[]'$  - 0 Wound Tracing (instead of photographs) '[]'$  - 0 Simple Wound Measurement - one wound X- 2 5 Complex Wound Measurement - multiple wounds INTERVENTIONS - Wound Dressings '[]'$  - 0 Small Wound Dressing one or multiple wounds X- 2 15 Medium Wound Dressing one or multiple wounds '[]'$  - 0 Large Wound Dressing one or multiple wounds X- 1 5 Application of Medications - topical '[]'$  - 0 Application of Medications - injection INTERVENTIONS - Miscellaneous '[]'$  - 0 External ear exam '[]'$  - 0 Specimen Collection (cultures, biopsies, blood, body fluids, etc.) '[]'$  - 0 Specimen(s) / Culture(s) sent or taken to Lab for analysis '[]'$  - 0 Patient Transfer (multiple staff / Civil Service fast streamer / Similar devices) '[]'$  - 0 Simple Staple / Suture removal (25 or less) '[]'$  - 0 Complex Staple / Suture removal (26 or more) '[]'$  - 0 Hypo / Hyperglycemic Management (close monitor of Blood Glucose) [name], [name] ([id number]) [id number] Page 3 of 10 '[]'$  - 0 Ankle / Brachial Index (ABI) - do not check if billed separately X- 1 5 Vital Signs Has the patient been seen at the hospital within the last three years: Yes Total Score: 135 Level Of Care: New/Established - Level 4 Electronic Signature(s) Signed: 01/07/2022 5:35:40 PM By: Rhae Hammock RN Entered By: Rhae Hammock on 12/25/2021 14:16:22 -------------------------------------------------------------------------------- Encounter Discharge Information Details Patient Name: Date of Service: Kenneth Mons MES Hood. 12/25/2021 1:15 PM Medical Record Number:  [id number] Patient Account Number: [id number] Date of Birth/Sex: Treating RN: February 05, 1935 (87 y.o. [name] Primary Care Sujata Maines: Dorthy Cooler, Dibas Other Clinician: Referring Ronnika Collett: Treating Jessamyn Watterson/Extender: Alanda Slim, Dibas Weeks in Treatment: 17 Encounter Discharge Information Items Discharge Condition: Stable Ambulatory Status: Ambulatory Discharge Destination: Home Transportation: Private Auto Accompanied By: son Schedule Follow-up Appointment: Yes Clinical Summary of Care: Patient Declined Electronic Signature(s) Signed: 01/07/2022 5:35:40 PM By: Rhae Hammock RN Entered By: Rhae Hammock on 12/25/2021 14:17:07 -------------------------------------------------------------------------------- Lower Extremity Assessment Details Patient Name: Date of Service: Kenneth Mons MES Hood. 12/25/2021 1:15 PM Medical Record Number: [id number] Patient Account Number: [id number] Date of Birth/Sex: Treating RN: 04-30-1935 (87 y.o. M) Primary Care Amaiya Scruton: Dorthy Cooler, Dibas Other Clinician: Referring Kennie Karapetian: Treating Thunder Bridgewater/Extender: Alanda Slim, Dibas Weeks in Treatment: 17 Edema Assessment Assessed: [Left: No] [Right: No] Edema: [Left: Ye] [Right: s] Calf Left: Right: Point of Measurement: 42 cm From Medial Instep 38 cm Ankle Left: Right: Point of Measurement: 8 cm From Medial Instep 22.6 cm Electronic Signature(s) Signed: 12/26/2021 8:46:25 AM By: Dorice Lamas Hood 12/26/2021 8:46:25 AM By: Erenest Blank Signed: ([id number]) [id number] Page 4 of 10 Entered By: Erenest Blank on 12/25/2021 13:48:16 -------------------------------------------------------------------------------- Multi Wound Chart Details Patient Name: Date of Service: KALLIN, HENK MES Hood. 12/25/2021 1:15 PM Medical Record Number: [id number] Patient Account Number: [id number] Date of Birth/Sex: Treating RN: 1935-12-25 (87 y.o. M) Primary  Care Geraldyn Shain: Dorthy Cooler, Dibas Other Clinician: Referring Krysta Bloomfield: Treating Kaoru Rezendes/Extender: Alanda Slim, Dibas Weeks in Treatment: 17 Vital Signs Height(in): 68 Pulse(bpm): 31 Weight(lbs): 198 Blood Pressure(mmHg): 117/68 Body Mass Index(BMI): 30.1 Temperature(F): 98.1 Respiratory Rate(breaths/min): 18 [2:Photos:] [N/A:N/A] Right T Great oe Right, Anterior Ankle N/A Wound Location: Trauma Shear/Friction N/A Wounding Event: Diabetic Wound/Ulcer of the Lower Diabetic Wound/Ulcer of the Lower N/A Primary Etiology: Extremity Extremity Abrasion Pressure Ulcer N/A Secondary Etiology: Cataracts, Angina, Type II Diabetes, Cataracts, Angina, Type II Diabetes,  N/A Comorbid History: Gout, Osteoarthritis, Neuropathy Gout, Osteoarthritis, Neuropathy 11/10/2021 12/04/2021 N/A Date Acquired: 5 2 N/A Weeks of Treatment: Open Open N/A Wound Status: No No N/A Wound Recurrence: 0.2x0.3x0.1 1.3x0.6x0.1 N/A Measurements Hood x W x D (cm) 0.047 0.613 N/A A (cm) : rea 0.005 0.061 N/A Volume (cm) : 94.00% 42.60% N/A % Reduction in A rea: 93.70% 43.00% N/A % Reduction in Volume: Grade 2 Unable to visualize wound bed N/A Classification: Medium None Present N/A Exudate A mount: Serosanguineous N/A N/A Exudate Type: red, brown N/A N/A Exudate Color: Distinct, outline attached Distinct, outline attached N/A Wound Margin: Large (67-100%) N/A N/A Granulation A mount: Red, Pink N/A N/A Granulation Quality: Small (1-33%) N/A N/A Necrotic A mount: Adherent Slough Eschar N/A Necrotic Tissue: Fat Layer (Subcutaneous Tissue): Yes Fascia: No N/A Exposed Structures: Bone: Yes Fat Layer (Subcutaneous Tissue): No Fascia: No Tendon: No Tendon: No Muscle: No Muscle: No Joint: No Joint: No Bone: No Small (1-33%) None N/A Epithelialization: Excoriation: No Excoriation: No N/A Periwound Skin Texture: Induration: No Induration: No Callus: No Callus: No Crepitus:  No Crepitus: No Rash: No Rash: No Scarring: No Scarring: No Dry/Scaly: Yes Dry/Scaly: Yes N/A Periwound Skin Moisture: Maceration: No Maceration: No Atrophie Blanche: No Atrophie Blanche: No N/A Periwound Skin Color: Cyanosis: No Cyanosis: No Ecchymosis: No Ecchymosis: No [name], [name] ([id number]) [id number] Page 5 of 10 Erythema: No Erythema: No Hemosiderin Staining: No Hemosiderin Staining: No Mottled: No Mottled: No Pallor: No Pallor: No Rubor: No Rubor: No Treatment Notes Electronic Signature(s) Signed: 12/25/2021 4:16:51 PM By: Kalman Shan DO Entered By: Kalman Shan on 12/25/2021 14:15:25 -------------------------------------------------------------------------------- Multi-Disciplinary Care Plan Details Patient Name: Date of Service: Kenneth Mons MES Hood. 12/25/2021 1:15 PM Medical Record Number: [id number] Patient Account Number: [id number] Date of Birth/Sex: Treating RN: 08/24/1935 (87 y.o. [name] Primary Care Toshia Larkin: Dorthy Cooler, Dibas Other Clinician: Referring Vianna Venezia: Treating Briauna Gilmartin/Extender: Alanda Slim, Dibas Weeks in Treatment: 17 Active Inactive Pressure Nursing Diagnoses: Knowledge deficit related to management of pressures ulcers Goals: Patient/caregiver will verbalize understanding of pressure ulcer management Date Initiated: 08/26/2021 Target Resolution Date: 01/03/2022 Goal Status: Active Interventions: Assess: immobility, friction, shearing, incontinence upon admission and as needed Assess offloading mechanisms upon admission and as needed Provide education on pressure ulcers Notes: Wound/Skin Impairment Nursing Diagnoses: Impaired tissue integrity Goals: Patient/caregiver will verbalize understanding of skin care regimen Date Initiated: 08/26/2021 Target Resolution Date: 01/03/2022 Goal Status: Active Ulcer/skin breakdown will have a volume reduction of 30% by week  4 Date Initiated: 08/26/2021 Target Resolution Date: 01/03/2022 Goal Status: Active Interventions: Assess patient/caregiver ability to obtain necessary supplies Assess patient/caregiver ability to perform ulcer/skin care regimen upon admission and as needed Assess ulceration(s) every visit Provide education on ulcer and skin care Treatment Activities: Topical wound management initiated : 08/26/2021 Notes: Electronic Signature(s) [name], [name] ([id number]) [phone].pdf Page 6 of 10 Signed: 01/07/2022 5:35:40 PM By: Rhae Hammock RN Entered By: Rhae Hammock on 12/25/2021 14:08:07 -------------------------------------------------------------------------------- Pain Assessment Details Patient Name: Date of Service: Kenneth Mons MES Hood. 12/25/2021 1:15 PM Medical Record Number: [id number] Patient Account Number: [id number] Date of Birth/Sex: Treating RN: 07/12/1935 (87 y.o. M) Primary Care Elisea Khader: Dorthy Cooler, Dibas Other Clinician: Referring Leannah Guse: Treating Kassidy Dockendorf/Extender: Alanda Slim, Dibas Weeks in Treatment: 17 Active Problems Location of Pain Severity and Description of Pain Patient Has Paino No Site Locations Pain Management and Medication Current Pain Management: Electronic Signature(s) Signed: 12/26/2021 8:46:25 AM By: Erenest Blank Entered By: Erenest Blank on 12/25/2021 13:40:38 -------------------------------------------------------------------------------- Patient/Caregiver Education Details  Patient Name: Date of Service: VERLAN, GROTZ MES Hood. 12/21/2023andnbsp1:15 PM Medical Record Number: [id number] Patient Account Number: [id number] Date of Birth/Gender: Treating RN: [birth year] (87 y.o. [name] Primary Care Physician: Dorthy Cooler, Dibas Other Clinician: Referring Physician: Treating Physician/Extender: Alanda Slim, Dibas Weeks in Treatment: 17 Education Assessment Education Provided  To: Patient [name], [name] ([id number]) [id number] Page 7 of 10 Education Topics Provided Wound/Skin Impairment: Methods: Explain/Verbal Responses: Reinforcements needed, State content correctly Electronic Signature(s) Signed: 01/07/2022 5:35:40 PM By: Rhae Hammock RN Entered By: Rhae Hammock on 12/25/2021 14:08:21 -------------------------------------------------------------------------------- Wound Assessment Details Patient Name: Date of Service: Kenneth Mons MES Hood. 12/25/2021 1:15 PM Medical Record Number: [id number] Patient Account Number: [id number] Date of Birth/Sex: Treating RN: [birth year] (87 y.o. M) Primary Care Boots Mcglown: Dorthy Cooler, Dibas Other Clinician: Referring Phinehas Grounds: Treating Ashly Goethe/Extender: Alanda Slim, Dibas Weeks in Treatment: 17 Wound Status Wound Number: 2 Primary Etiology: Diabetic Wound/Ulcer of the Lower Extremity Wound Location: Right T Great oe Secondary Abrasion Etiology: Wounding Event: Trauma Wound Status: Open Date Acquired: 11/10/2021 Comorbid Cataracts, Angina, Type II Diabetes, Gout, Osteoarthritis, Weeks Of Treatment: 5 History: Neuropathy Clustered Wound: No Photos Wound Measurements Length: (cm) 0.2 Width: (cm) 0.3 Depth: (cm) 0.1 Area: (cm) 0.047 Volume: (cm) 0.005 % Reduction in Area: 94% % Reduction in Volume: 93.7% Epithelialization: Small (1-33%) Tunneling: No Wound Description Classification: Grade 2 Wound Margin: Distinct, outline attached Exudate Amount: Medium Exudate Type: Serosanguineous Exudate Color: red, brown Foul Odor After Cleansing: No Slough/Fibrino Yes Wound Bed Granulation Amount: Large (67-100%) Exposed Structure Granulation Quality: Red, Pink Fascia Exposed: No Necrotic Amount: Small (1-33%) Fat Layer (Subcutaneous Tissue) Exposed: Yes Necrotic Quality: Adherent Slough Tendon Exposed: No Muscle Exposed: No Joint Exposed: No Bone Exposed:  Yes [address] [name], [name] ([id number]) [phone].pdf Page 8 of 10 Texture Color No Abnormalities Noted: No No Abnormalities Noted: No Callus: No Atrophie Blanche: No Crepitus: No Cyanosis: No Excoriation: No Ecchymosis: No Induration: No Erythema: No Rash: No Hemosiderin Staining: No Scarring: No Mottled: No Pallor: No Moisture Rubor: No No Abnormalities Noted: No Dry / Scaly: Yes Maceration: No Treatment Notes Wound #2 (Toe Great) Wound Laterality: Right Cleanser Soap and Water Discharge Instruction: May shower and wash wound with dial antibacterial soap and water prior to dressing change. Peri-Wound Care Skin Prep Discharge Instruction: Use skin prep as directed Topical Primary Dressing MediHoney Gel, tube 1.5 (oz) Discharge Instruction: Apply to wound bed as instructed Hydrofera Blue Ready Foam, 2.5 x2.5 in Discharge Instruction: Apply to wound bed as instructed Secondary Dressing ALLEVYN Gentle Border, 3x3 (in/in) Discharge Instruction: Apply over primary dressing as directed. Secured With Compression Wrap tubi grip size E Compression Stockings Add-Ons Cotton Tip Swabs Electronic Signature(s) Signed: 12/26/2021 8:46:25 AM By: Erenest Blank Entered By: Erenest Blank on 12/25/2021 13:50:10 -------------------------------------------------------------------------------- Wound Assessment Details Patient Name: Date of Service: Kenneth Mons MES Hood. 12/25/2021 1:15 PM Medical Record Number: [id number] Patient Account Number: [id number] Date of Birth/Sex: Treating RN: 1935-05-02 (87 y.o. M) Primary Care Azion Centrella: Dorthy Cooler, Dibas Other Clinician: Referring Benuel Ly: Treating Miriam Liles/Extender: Alanda Slim, Dibas Weeks in Treatment: 17 Wound Status Wound Number: 3 Primary Etiology: Diabetic Wound/Ulcer of the Lower Extremity Wound Location: Right, Anterior Ankle Secondary Pressure Ulcer Etiology: Wounding  Event: Shear/Friction Wound Status: Open Date Acquired: 12/04/2021 Comorbid Cataracts, Angina, Type II Diabetes, Gout, Osteoarthritis, Weeks Of Treatment: 2 History: Neuropathy Clustered Wound: No [name], [name] ([id number]) [id number] Page 9 of 10 Photos Wound Measurements Length: (cm) 1.3 Width: (cm) 0.6 Depth: (cm) 0.1 Area: (  cm) 0.613 Volume: (cm) 0.061 % Reduction in Area: 42.6% % Reduction in Volume: 43% Epithelialization: None Tunneling: No Undermining: No Wound Description Classification: Unable to visualize wound bed Wound Margin: Distinct, outline attached Exudate Amount: None Present Foul Odor After Cleansing: No Slough/Fibrino No Wound Bed Necrotic Amount: Large (67-100%) Exposed Structure Necrotic Quality: Eschar Fascia Exposed: No Fat Layer (Subcutaneous Tissue) Exposed: No Tendon Exposed: No Muscle Exposed: No Joint Exposed: No Bone Exposed: No Periwound Skin Texture Texture Color No Abnormalities Noted: No No Abnormalities Noted: No Callus: No Atrophie Blanche: No Crepitus: No Cyanosis: No Excoriation: No Ecchymosis: No Induration: No Erythema: No Rash: No Hemosiderin Staining: No Scarring: No Mottled: No Pallor: No Moisture Rubor: No No Abnormalities Noted: No Dry / Scaly: Yes Maceration: No Treatment Notes Wound #3 (Ankle) Wound Laterality: Right, Anterior Cleanser Soap and Water Discharge Instruction: May shower and wash wound with dial antibacterial soap and water prior to dressing change. Peri-Wound Care Skin Prep Discharge Instruction: Use skin prep as directed Topical Primary Dressing MediHoney Gel, tube 1.5 (oz) Discharge Instruction: Apply to wound bed as instructed Hydrofera Blue Ready Foam, 2.5 x2.5 in Discharge Instruction: Apply to wound bed as instructed Secondary Dressing ABD Pad, 5x9 Discharge Instruction: Apply over primary dressing as directed. [name], [name] ([id number])  [id number] Page 10 of 10 Secured With [employer], 4.5x3.1 (in/yd) Discharge Instruction: Secure with Kerlix as directed. 23M Medipore H Soft Cloth Surgical T ape, 4 x 10 (in/yd) Discharge Instruction: Secure with tape as directed. Compression Wrap tubi grip size E Compression Stockings Add-Ons Cotton Tip Swabs Electronic Signature(s) Signed: 12/26/2021 8:46:25 AM By: Erenest Blank Entered By: Erenest Blank on 12/25/2021 13:50:39 -------------------------------------------------------------------------------- Vitals Details Patient Name: Date of Service: Kenneth Mons MES Hood. 12/25/2021 1:15 PM Medical Record Number: [id number] Patient Account Number: [id number] Date of Birth/Sex: Treating RN: Feb 08, 1935 (87 y.o. M) Primary Care Toran Murch: Dorthy Cooler, Dibas Other Clinician: Referring Shavy Beachem: Treating Elion Hocker/Extender: Alanda Slim, Dibas Weeks in Treatment: 17 Vital Signs Time Taken: 13:40 Temperature (F): 98.1 Height (in): 68 Pulse (bpm): 82 Weight (lbs): 198 Respiratory Rate (breaths/min): 18 Body Mass Index (BMI): 30.1 Blood Pressure (mmHg): 117/68 Reference Range: 80 - 120 mg / dl Electronic Signature(s) Signed: 12/26/2021 8:46:25 AM By: Erenest Blank Entered By: Erenest Blank on 12/25/2021 13:40:30

## 2022-01-09 ENCOUNTER — Encounter (HOSPITAL_BASED_OUTPATIENT_CLINIC_OR_DEPARTMENT_OTHER): Payer: Medicare Other | Attending: Internal Medicine | Admitting: Internal Medicine

## 2022-01-09 DIAGNOSIS — L97518 Non-pressure chronic ulcer of other part of right foot with other specified severity: Secondary | ICD-10-CM | POA: Insufficient documentation

## 2022-01-09 DIAGNOSIS — L97918 Non-pressure chronic ulcer of unspecified part of right lower leg with other specified severity: Secondary | ICD-10-CM

## 2022-01-09 DIAGNOSIS — E1142 Type 2 diabetes mellitus with diabetic polyneuropathy: Secondary | ICD-10-CM | POA: Diagnosis not present

## 2022-01-09 DIAGNOSIS — Z87891 Personal history of nicotine dependence: Secondary | ICD-10-CM | POA: Diagnosis not present

## 2022-01-09 DIAGNOSIS — M86171 Other acute osteomyelitis, right ankle and foot: Secondary | ICD-10-CM | POA: Insufficient documentation

## 2022-01-09 DIAGNOSIS — Z7984 Long term (current) use of oral hypoglycemic drugs: Secondary | ICD-10-CM | POA: Insufficient documentation

## 2022-01-09 DIAGNOSIS — S81802A Unspecified open wound, left lower leg, initial encounter: Secondary | ICD-10-CM | POA: Diagnosis not present

## 2022-01-09 DIAGNOSIS — L97514 Non-pressure chronic ulcer of other part of right foot with necrosis of bone: Secondary | ICD-10-CM | POA: Diagnosis not present

## 2022-01-09 DIAGNOSIS — I739 Peripheral vascular disease, unspecified: Secondary | ICD-10-CM | POA: Diagnosis not present

## 2022-01-09 DIAGNOSIS — E11621 Type 2 diabetes mellitus with foot ulcer: Secondary | ICD-10-CM | POA: Diagnosis not present

## 2022-01-09 NOTE — Progress Notes (Signed)
[name], [name] ([id number]) [id number] Page 1 of 9 Visit Report for 01/09/2022 Chief Complaint Document Details Patient Name: Date of Service: [name], [name] 01/09/2022 9:30 A M Medical Record Number: [id number] Patient Account Number: [id number] Date of Birth/Sex: Treating RN: [birth year] (87 y.o. M) Primary Care Provider: Dorthy Cooler, Dibas Other Clinician: Referring Provider: Treating Provider/Extender: Alanda Slim, Dibas Weeks in Treatment: 51 Information Obtained from: Patient Chief Complaint right foot wound, Right leg wound and left leg wound Electronic Signature(s) Signed: 01/09/2022 11:39:44 AM By: Kalman Shan DO Entered By: Kalman Shan on 01/09/2022 10:33:07 -------------------------------------------------------------------------------- HPI Details Patient Name: Date of Service: Kenneth Mons MES L. 01/09/2022 9:30 A M Medical Record Number: [id number] Patient Account Number: [id number] Date of Birth/Sex: Treating RN: 1935-12-20 (87 y.o. M) Primary Care Provider: Dorthy Cooler, Dibas Other Clinician: Referring Provider: Treating Provider/Extender: Alanda Slim, Dibas Weeks in Treatment: 69 History of Present Illness HPI Description: Admission 08/26/2021 [name] is an 87 year old male with a past medical history of controlled type 2 diabetes on oral agents with peripheral neuropathy and Atrial fibrillation not on blood thinners that presents to the clinic with a right foot wound that has waxed and waned in healing over the past year. He has peripheral neuropathy and is not sure when the wound started or how it started. He had an MRI done by his primary care physician on 08/03/2021 that showed potential early acute osteomyelitis at the posterior calcaneus. He was seen by Dr. Linus Salmons on 08/06/2021 and started on doxycycline and cefadroxil due to these findings. He was evaluated by podiatry, Dr. Jacqualyn Posey and had a bone biopsy done on  08/13/2021 that did not show evidence of osteomyelitis. He had a culture that showed MRSA sensitive to doxycycline. He also had ABIs with TBI's done on 8/2 that showed an ABI of 0.58 and a noncompressible TBI. He is scheduled to see vein and vascular on 8/30. Currently he is keeping the wound covered. He is using a soft surgical shoe. He denies signs of infection. 8/5; patient presents for follow-up. He had follow-up with Dr. Linus Salmons On 8/23. Plan is for 6 weeks of doxycycline through September 16. Although path was negative for osteo there is still concern due to positive culture from the bone. Unfortunately patient missed his vein and vascular appointment to assess blood flow status. This has been rescheduled for 9/8. Patient has been using Medihoney to the wound bed. He tries to offload the wound bed but has trouble at night keeping pressure off of it. He does not have Prevalon boots. He currently denies signs of infection. 9/19; patient presents for follow-up. He is scheduled to have a bilateral lower extremity angiogram on 9/20. He currently denies signs of infection. He has been using Medihoney to the wound bed. 11/13; patient has missed his last follow-up. In fact he has not been here in 2 months. He had bilateral iliofemoral endarterectomy with retrograde iliac stenting on 10/09/2021. He has been using Medihoney with Hydrofera Blue to the heel wound. He reports improvement in wound healing here. 2 weeks ago he developed a wound to the dorsal aspect of the great toe. He states His shoes cause this issue and he is currently wearing different shoes. He is on clindamycin as there was concern for infection to the right great toe. He currently denies signs of infection. He has been keeping the great toe open to air. 12/4; patient presents for follow-up. He has been using Medihoney and Hydrofera Blue to the heel wound  and right great toe wound. The heel wound is closed. The right great toe wound has bone  exposed. He now has a new wound to the right ankle that he states he used to shoehorn to scratch the area and created the wound. He has not been dressing this area. 12/21; patient presents for follow-up. He has been using Hydrofera Blue and Medihoney to the right anterior leg wound and right toe wound. There is been improvement in wound healing. He has been taking doxycycline prescribed at last clinic visit. He has no issues or complaints today. 1/5; patient presents for follow-up. He has been using Hydrofera Blue and Medihoney to the right anterior leg wound and right toe wound. Unfortunately has developed a new wound to the left lower extremity. He is not sure how this started. He has completed his course of doxycycline. He denies signs of infection. [name], [name] ([id number]) [id number] Page 2 of 9 He is scheduled to see vein and vascular for follow-up on 1/12. Electronic Signature(s) Signed: 01/09/2022 11:39:44 AM By: Kalman Shan DO Entered By: Kalman Shan on 01/09/2022 10:35:43 -------------------------------------------------------------------------------- Physical Exam Details Patient Name: Date of Service: Kenneth Mons MES L. 01/09/2022 9:30 A M Medical Record Number: [id number] Patient Account Number: [id number] Date of Birth/Sex: Treating RN: [birth year] (87 y.o. M) Primary Care Provider: Dorthy Cooler, Dibas Other Clinician: Referring Provider: Treating Provider/Extender: Alanda Slim, Dibas Weeks in Treatment: 65 Constitutional respirations regular, non-labored and within target range for patient.. Cardiovascular 2+ dorsalis pedis/posterior tibialis pulses. Psychiatric pleasant and cooperative. Notes T the dorsal aspect of the right great toe there is an open wound with granulation tissue. No bone exposed. T the right anterior ankle crease there is an open o o wound with dried non viable tissue/drainage and granulation tissue. No signs of  infection. New wound to the left anterior leg with nonviable tissue throughout. Electronic Signature(s) Signed: 01/09/2022 11:39:44 AM By: Kalman Shan DO Entered By: Kalman Shan on 01/09/2022 10:43:55 -------------------------------------------------------------------------------- Physician Orders Details Patient Name: Date of Service: Kenneth Mons MES L. 01/09/2022 9:30 A M Medical Record Number: [id number] Patient Account Number: [id number] Date of Birth/Sex: Treating RN: June 29, 1935 (87 y.o. [name] Primary Care Provider: Dorthy Cooler, Dibas Other Clinician: Referring Provider: Treating Provider/Extender: Alanda Slim, Dibas Weeks in Treatment: 58 Verbal / Phone Orders: No Diagnosis Coding ICD-10 Coding Code Description E11.621 Type 2 diabetes mellitus with foot ulcer I73.9 Peripheral vascular disease, unspecified M86.171 Other acute osteomyelitis, right ankle and foot E11.40 Type 2 diabetes mellitus with diabetic neuropathy, unspecified L97.514 Non-pressure chronic ulcer of other part of right foot with necrosis of bone L97.918 Non-pressure chronic ulcer of unspecified part of right lower leg with other specified severity Follow-up Appointments ppointment in 2 weeks. - Dr. Heber [place] 01/22/2022 2pm. Return A Other: - ensure to Vein and Vascular 01/16/2022. VASCO, CHONG ([id number]) [id number] Page 3 of 9 Anesthetic (In clinic) Topical Lidocaine 5% applied to wound bed (In clinic) Topical Lidocaine 4% applied to wound bed Bathing/ Shower/ Hygiene May shower and wash wound with soap and water. Edema Control - Lymphedema / SCD / Other Elevate legs to the level of the heart or above for 30 minutes daily and/or when sitting for 3-4 times a day throughout the day. Avoid standing for long periods of time. Moisturize legs daily. Off-Loading Open toe surgical shoe to: - Wear socks with your shoes Other: - -Keep pressure off area as much as  is possible wear while resting in bed or chair- Prevalon Boot do  not walk or stand with it. Additional Orders / Instructions Follow Nutritious Diet Wound Treatment Wound #2 - T Great oe Wound Laterality: Right Cleanser: Soap and Water 1 x Per Day/7 Days Discharge Instructions: May shower and wash wound with dial antibacterial soap and water prior to dressing change. Peri-Wound Care: Skin Prep (Generic) 1 x Per Day/7 Days Discharge Instructions: Use skin prep as directed Prim Dressing: MediHoney Gel, tube 1.5 (oz) 1 x Per Day/7 Days ary Discharge Instructions: Apply to wound bed as instructed Prim Dressing: Hydrofera Blue Ready Foam, 2.5 x2.5 in (Generic) 1 x Per Day/7 Days ary Discharge Instructions: Apply to wound bed as instructed Secondary Dressing: Woven Gauze Sponges 2x2 in 1 x Per Day/7 Days Discharge Instructions: Apply over primary dressing as directed. Secured With: 77M Medipore H Soft Cloth Surgical T ape, 4 x 10 (in/yd) 1 x Per Day/7 Days Discharge Instructions: Secure with tape as directed. Add-Ons: Cotton Tip Swabs (Generic) 1 x Per Day/7 Days Wound #3 - Ankle Wound Laterality: Right, Anterior Cleanser: Soap and Water 1 x Per Day/7 Days Discharge Instructions: May shower and wash wound with dial antibacterial soap and water prior to dressing change. Peri-Wound Care: Skin Prep (Generic) 1 x Per Day/7 Days Discharge Instructions: Use skin prep as directed Prim Dressing: MediHoney Gel, tube 1.5 (oz) 1 x Per Day/7 Days ary Discharge Instructions: Apply to wound bed as instructed Prim Dressing: Hydrofera Blue Ready Foam, 2.5 x2.5 in (Generic) 1 x Per Day/7 Days ary Discharge Instructions: Apply to wound bed as instructed Secondary Dressing: Bordered Gauze, 2x2 in 1 x Per Day/7 Days Discharge Instructions: Apply over primary dressing as directed. Add-Ons: Cotton Tip Swabs (Generic) 1 x Per Day/7 Days Wound #4 - Ankle Wound Laterality: Left, Anterior Cleanser: Soap and Water  1 x Per Day/7 Days Discharge Instructions: May shower and wash wound with dial antibacterial soap and water prior to dressing change. Peri-Wound Care: Skin Prep (Generic) 1 x Per Day/7 Days Discharge Instructions: Use skin prep as directed Prim Dressing: MediHoney Gel, tube 1.5 (oz) 1 x Per Day/7 Days ary Discharge Instructions: Apply to wound bed as instructed Prim Dressing: Hydrofera Blue Ready Foam, 2.5 x2.5 in (Generic) 1 x Per Day/7 Days ary Discharge Instructions: Apply to wound bed as instructed Secondary Dressing: Bordered Gauze, 2x2 in 1 x Per Day/7 Days Discharge Instructions: Apply over primary dressing as directed. Add-Ons: Cotton Tip Swabs (Generic) 1 x Per Day/7 Days [name], [name] ([id number]) [id number] Page 4 of 9 Electronic Signature(s) Signed: 01/09/2022 11:39:44 AM By: Kalman Shan DO Entered By: Kalman Shan on 01/09/2022 10:44:04 -------------------------------------------------------------------------------- Problem List Details Patient Name: Date of Service: Kenneth Mons MES L. 01/09/2022 9:30 A M Medical Record Number: [id number] Patient Account Number: [id number] Date of Birth/Sex: Treating RN: 1935-07-25 (87 y.o. [name] Primary Care Provider: Dorthy Cooler, Dibas Other Clinician: Referring Provider: Treating Provider/Extender: Alanda Slim, Dibas Weeks in Treatment: 19 Active Problems ICD-10 Encounter Code Description Active Date MDM Diagnosis E11.621 Type 2 diabetes mellitus with foot ulcer 08/26/2021 No Yes I73.9 Peripheral vascular disease, unspecified 08/26/2021 No Yes M86.171 Other acute osteomyelitis, right ankle and foot 08/26/2021 No Yes E11.40 Type 2 diabetes mellitus with diabetic neuropathy, unspecified 08/26/2021 No Yes L97.514 Non-pressure chronic ulcer of other part of right foot with necrosis of bone 12/08/2021 No Yes L97.918 Non-pressure chronic ulcer of unspecified part of right lower leg with  other 12/25/2021 No Yes specified severity S81.802A Unspecified open wound, left lower leg, initial encounter 01/09/2022 No Yes  Inactive Problems Resolved Problems Electronic Signature(s) Signed: 01/09/2022 11:39:44 AM By: Kalman Shan DO Entered By: Kalman Shan on 01/09/2022 10:31:36 [name] ([id number]) [id number] Page 5 of 9 -------------------------------------------------------------------------------- Progress Note Details Patient Name: Date of Service: [name], [name] MES L. 01/09/2022 9:30 A M Medical Record Number: [id number] Patient Account Number: [id number] Date of Birth/Sex: Treating RN: [birth year] (87 y.o. M) Primary Care Provider: Dorthy Cooler, Dibas Other Clinician: Referring Provider: Treating Provider/Extender: Alanda Slim, Dibas Weeks in Treatment: 19 Subjective Chief Complaint Information obtained from Patient right foot wound, Right leg wound and left leg wound History of Present Illness (HPI) Admission 08/26/2021 [name] is an 87 year old male with a past medical history of controlled type 2 diabetes on oral agents with peripheral neuropathy and Atrial fibrillation not on blood thinners that presents to the clinic with a right foot wound that has waxed and waned in healing over the past year. He has peripheral neuropathy and is not sure when the wound started or how it started. He had an MRI done by his primary care physician on 08/03/2021 that showed potential early acute osteomyelitis at the posterior calcaneus. He was seen by Dr. Linus Salmons on 08/06/2021 and started on doxycycline and cefadroxil due to these findings. He was evaluated by podiatry, Dr. Jacqualyn Posey and had a bone biopsy done on 08/13/2021 that did not show evidence of osteomyelitis. He had a culture that showed MRSA sensitive to doxycycline. He also had ABIs with TBI's done on 8/2 that showed an ABI of 0.58 and a noncompressible TBI. He is scheduled to see vein  and vascular on 8/30. Currently he is keeping the wound covered. He is using a soft surgical shoe. He denies signs of infection. 8/5; patient presents for follow-up. He had follow-up with Dr. Linus Salmons On 8/23. Plan is for 6 weeks of doxycycline through September 16. Although path was negative for osteo there is still concern due to positive culture from the bone. Unfortunately patient missed his vein and vascular appointment to assess blood flow status. This has been rescheduled for 9/8. Patient has been using Medihoney to the wound bed. He tries to offload the wound bed but has trouble at night keeping pressure off of it. He does not have Prevalon boots. He currently denies signs of infection. 9/19; patient presents for follow-up. He is scheduled to have a bilateral lower extremity angiogram on 9/20. He currently denies signs of infection. He has been using Medihoney to the wound bed. 11/13; patient has missed his last follow-up. In fact he has not been here in 2 months. He had bilateral iliofemoral endarterectomy with retrograde iliac stenting on 10/09/2021. He has been using Medihoney with Hydrofera Blue to the heel wound. He reports improvement in wound healing here. 2 weeks ago he developed a wound to the dorsal aspect of the great toe. He states His shoes cause this issue and he is currently wearing different shoes. He is on clindamycin as there was concern for infection to the right great toe. He currently denies signs of infection. He has been keeping the great toe open to air. 12/4; patient presents for follow-up. He has been using Medihoney and Hydrofera Blue to the heel wound and right great toe wound. The heel wound is closed. The right great toe wound has bone exposed. He now has a new wound to the right ankle that he states he used to shoehorn to scratch the area and created the wound. He has not been dressing this area. 12/21; patient  presents for follow-up. He has been using Hydrofera Blue  and Medihoney to the right anterior leg wound and right toe wound. There is been improvement in wound healing. He has been taking doxycycline prescribed at last clinic visit. He has no issues or complaints today. 1/5; patient presents for follow-up. He has been using Hydrofera Blue and Medihoney to the right anterior leg wound and right toe wound. Unfortunately has developed a new wound to the left lower extremity. He is not sure how this started. He has completed his course of doxycycline. He denies signs of infection. He is scheduled to see vein and vascular for follow-up on 1/12. Patient History Information obtained from Patient, Chart. Family History Diabetes - Child, Stroke - Mother, No family history of Cancer, Heart Disease, Hereditary Spherocytosis, Hypertension, Kidney Disease, Lung Disease, Seizures, Thyroid Problems, Tuberculosis. Social History Former smoker, Marital Status - Divorced, Alcohol Use - Moderate, Drug Use - Prior History, Caffeine Use - Daily. Medical History Eyes Patient has history of Cataracts - Surgery Cardiovascular Patient has history of Angina - AFib Endocrine Patient has history of Type II Diabetes Musculoskeletal Patient has history of Gout, Osteoarthritis Denies history of Rheumatoid Arthritis, Osteomyelitis Neurologic Patient has history of Neuropathy Hospitalization/Surgery History - inpatient 09/2021 arterial stenting on legs.. Medical A Surgical History Notes nd Cardiovascular Hx CVA [name], [name] ([id number]) [id number] Page 6 of 9 Objective Constitutional respirations regular, non-labored and within target range for patient.. Vitals Time Taken: 9:49 AM, Height: 68 in, Weight: 198 lbs, BMI: 30.1, Temperature: 98.3 F, Pulse: 79 bpm, Respiratory Rate: 18 breaths/min, Blood Pressure: 113/72 mmHg. Cardiovascular 2+ dorsalis pedis/posterior tibialis pulses. Psychiatric pleasant and cooperative. General Notes: T  the dorsal aspect of the right great toe there is an open wound with granulation tissue. No bone exposed. T the right anterior ankle crease o o there is an open wound with dried non viable tissue/drainage and granulation tissue. No signs of infection. New wound to the left anterior leg with nonviable tissue throughout. Integumentary (Hair, Skin) Wound #2 status is Open. Original cause of wound was Trauma. The date acquired was: 11/10/2021. The wound has been in treatment 7 weeks. The wound is located on the Right T Great. The wound measures 0.5cm length x 0.6cm width x 0.1cm depth; 0.236cm^2 area and 0.024cm^3 volume. There is bone and oe Fat Layer (Subcutaneous Tissue) exposed. There is no tunneling or undermining noted. There is a medium amount of serosanguineous drainage noted. The wound margin is distinct with the outline attached to the wound base. There is no granulation within the wound bed. There is a large (67-100%) amount of necrotic tissue within the wound bed including Adherent Slough. The periwound skin appearance exhibited: Dry/Scaly. The periwound skin appearance did not exhibit: Callus, Crepitus, Excoriation, Induration, Rash, Scarring, Maceration, Atrophie Blanche, Cyanosis, Ecchymosis, Hemosiderin Staining, Mottled, Pallor, Rubor, Erythema. Wound #3 status is Open. Original cause of wound was Shear/Friction. The date acquired was: 12/04/2021. The wound has been in treatment 4 weeks. The wound is located on the Right,Anterior Ankle. The wound measures 1.3cm length x 0.6cm width x 0.1cm depth; 0.613cm^2 area and 0.061cm^3 volume. There is a none present amount of drainage noted. The wound margin is distinct with the outline attached to the wound base. There is a large (67-100%) amount of necrotic tissue within the wound bed including Eschar. The periwound skin appearance exhibited: Dry/Scaly. The periwound skin appearance did not exhibit: Callus, Crepitus, Excoriation, Induration,  Rash, Scarring, Maceration, Atrophie Blanche, Cyanosis, Ecchymosis, Hemosiderin  Staining, Mottled, Pallor, Rubor, Erythema. Wound #4 status is Open. Original cause of wound was Gradually Appeared. The date acquired was: 01/02/2022. The wound is located on the Left,Anterior Ankle. The wound measures 2.3cm length x 1.2cm width x 0.2cm depth; 2.168cm^2 area and 0.434cm^3 volume. There is no tunneling or undermining noted. There is a medium amount of serosanguineous drainage noted. The wound margin is distinct with the outline attached to the wound base. There is no granulation within the wound bed. There is a large (67-100%) amount of necrotic tissue within the wound bed including Eschar and Adherent Slough. The periwound skin appearance did not exhibit: Callus, Crepitus, Excoriation, Induration, Rash, Scarring, Dry/Scaly, Maceration, Atrophie Blanche, Cyanosis, Ecchymosis, Hemosiderin Staining, Mottled, Pallor, Rubor, Erythema. Assessment Active Problems ICD-10 Type 2 diabetes mellitus with foot ulcer Peripheral vascular disease, unspecified Other acute osteomyelitis, right ankle and foot Type 2 diabetes mellitus with diabetic neuropathy, unspecified Non-pressure chronic ulcer of other part of right foot with necrosis of bone Non-pressure chronic ulcer of unspecified part of right lower leg with other specified severity Unspecified open wound, left lower leg, initial encounter Patient's right lower extremity wounds appear well-healing. I recommended continuing Medihoney and Hydrofera Blue. No need for further antibiotics. Unfortunately he has developed a new wound to the left lower extremity. He has significant peripheral arterial disease to this leg. His last ABI was 12/12/2021. His right ABI was 0.84 and his left ABI was 0.48. He has follow-up with vein and vascular on 1/12. I recommended he discuss the development of his new wound on the left leg with them for further potential  revascularization. I also recommended using Medihoney and Hydrofera Blue here. Plan Follow-up Appointments: Return Appointment in 2 weeks. - Dr. Heber Savage 01/22/2022 2pm. Other: - ensure to Vein and Vascular 01/16/2022. Anesthetic: (In clinic) Topical Lidocaine 5% applied to wound bed (In clinic) Topical Lidocaine 4% applied to wound bed Bathing/ Shower/ Hygiene: May shower and wash wound with soap and water. Edema Control - Lymphedema / SCD / OtherMIKHAEL, [name] ([id number]) [id number] Page 7 of 9 Elevate legs to the level of the heart or above for 30 minutes daily and/or when sitting for 3-4 times a day throughout the day. Avoid standing for long periods of time. Moisturize legs daily. Off-Loading: Open toe surgical shoe to: - Wear socks with your shoes Other: - -Keep pressure off area as much as is possible wear while resting in bed or chair- Prevalon Boot do not walk or stand with it. Additional Orders / Instructions: Follow Nutritious Diet WOUND #2: - T Great Wound Laterality: Right oe Cleanser: Soap and Water 1 x Per Day/7 Days Discharge Instructions: May shower and wash wound with dial antibacterial soap and water prior to dressing change. Peri-Wound Care: Skin Prep (Generic) 1 x Per Day/7 Days Discharge Instructions: Use skin prep as directed Prim Dressing: MediHoney Gel, tube 1.5 (oz) 1 x Per Day/7 Days ary Discharge Instructions: Apply to wound bed as instructed Prim Dressing: Hydrofera Blue Ready Foam, 2.5 x2.5 in (Generic) 1 x Per Day/7 Days ary Discharge Instructions: Apply to wound bed as instructed Secondary Dressing: Woven Gauze Sponges 2x2 in 1 x Per Day/7 Days Discharge Instructions: Apply over primary dressing as directed. Secured With: 5M Medipore H Soft Cloth Surgical T ape, 4 x 10 (in/yd) 1 x Per Day/7 Days Discharge Instructions: Secure with tape as directed. Add-Ons: Cotton Tip Swabs (Generic) 1 x Per Day/7 Days WOUND #3: - Ankle  Wound Laterality: Right, Anterior Cleanser: Soap  and Water 1 x Per Day/7 Days Discharge Instructions: May shower and wash wound with dial antibacterial soap and water prior to dressing change. Peri-Wound Care: Skin Prep (Generic) 1 x Per Day/7 Days Discharge Instructions: Use skin prep as directed Prim Dressing: MediHoney Gel, tube 1.5 (oz) 1 x Per Day/7 Days ary Discharge Instructions: Apply to wound bed as instructed Prim Dressing: Hydrofera Blue Ready Foam, 2.5 x2.5 in (Generic) 1 x Per Day/7 Days ary Discharge Instructions: Apply to wound bed as instructed Secondary Dressing: Bordered Gauze, 2x2 in 1 x Per Day/7 Days Discharge Instructions: Apply over primary dressing as directed. Add-Ons: Cotton Tip Swabs (Generic) 1 x Per Day/7 Days WOUND #4: - Ankle Wound Laterality: Left, Anterior Cleanser: Soap and Water 1 x Per Day/7 Days Discharge Instructions: May shower and wash wound with dial antibacterial soap and water prior to dressing change. Peri-Wound Care: Skin Prep (Generic) 1 x Per Day/7 Days Discharge Instructions: Use skin prep as directed Prim Dressing: MediHoney Gel, tube 1.5 (oz) 1 x Per Day/7 Days ary Discharge Instructions: Apply to wound bed as instructed Prim Dressing: Hydrofera Blue Ready Foam, 2.5 x2.5 in (Generic) 1 x Per Day/7 Days ary Discharge Instructions: Apply to wound bed as instructed Secondary Dressing: Bordered Gauze, 2x2 in 1 x Per Day/7 Days Discharge Instructions: Apply over primary dressing as directed. Add-Ons: Cotton Tip Swabs (Generic) 1 x Per Day/7 Days 1. Medihoney and Hydrofera Blue 2. Follow-up in 2 weeks 3. Follow-up with vein and vascular Electronic Signature(s) Signed: 01/09/2022 11:39:44 AM By: Kalman Shan DO Entered By: Kalman Shan on 01/09/2022 10:56:17 -------------------------------------------------------------------------------- HxROS Details Patient Name: Date of Service: Kenneth Mons MES L. 01/09/2022 9:30 A M Medical Record  Number: [id number] Patient Account Number: [id number] Date of Birth/Sex: Treating RN: 12-Nov-1935 (87 y.o. M) Primary Care Provider: Dorthy Cooler, [place] Other Clinician: Referring Provider: Treating Provider/Extender: Alanda Slim, [place] Weeks in Treatment: 44 Information Obtained From Patient Chart Eyes Medical History: Positive for: Cataracts - Surgery [name], [name] ([id number]) [id number] Page 8 of 9 Cardiovascular Medical History: Positive for: Angina - AFib Past Medical History Notes: Hx CVA Endocrine Medical History: Positive for: Type II Diabetes Treated with: Oral agents, Diet Blood sugar tested every day: No Musculoskeletal Medical History: Positive for: Gout; Osteoarthritis Negative for: Rheumatoid Arthritis; Osteomyelitis Neurologic Medical History: Positive for: Neuropathy HBO Extended History Items Eyes: Cataracts Immunizations Pneumococcal Vaccine: Received Pneumococcal Vaccination: No Implantable Devices None Hospitalization / Surgery History Type of Hospitalization/Surgery inpatient 09/2021 arterial stenting on legs. Family and Social History Cancer: No; Diabetes: Yes - Child; Heart Disease: No; Hereditary Spherocytosis: No; Hypertension: No; Kidney Disease: No; Lung Disease: No; Seizures: No; Stroke: Yes - Mother; Thyroid Problems: No; Tuberculosis: No; Former smoker; Marital Status - Divorced; Alcohol Use: Moderate; Drug Use: Prior History; Caffeine Use: Daily; Financial Concerns: No; Food, Clothing or Shelter Needs: No; Support System Lacking: No; Transportation Concerns: No Electronic Signature(s) Signed: 01/09/2022 11:39:44 AM By: Kalman Shan DO Entered By: Kalman Shan on 01/09/2022 10:42:48 -------------------------------------------------------------------------------- SuperBill Details Patient Name: Date of Service: Kenneth Mons MES L. 01/09/2022 Medical Record Number: [id number] Patient Account Number:  [id number] Date of Birth/Sex: Treating RN: Apr 30, 1935 (87 y.o. [name] Primary Care Provider: Dorthy Cooler, [place] Other Clinician: Referring Provider: Treating Provider/Extender: Alanda Slim, [place] Weeks in Treatment: 19 Diagnosis Coding ICD-10 Codes Code Description E11.621 Type 2 diabetes mellitus with foot ulcer I73.9 Peripheral vascular disease, unspecified M86.171 Other acute osteomyelitis, right ankle and foot [name], [name] ([id number]) [id number] Page 9 of 9  E11.40 Type 2 diabetes mellitus with diabetic neuropathy, unspecified L97.514 Non-pressure chronic ulcer of other part of right foot with necrosis of bone L97.918 Non-pressure chronic ulcer of unspecified part of right lower leg with other specified severity S81.802A Unspecified open wound, left lower leg, initial encounter Facility Procedures : CPT4 Code: [id number] Description: 72536 - WOUND CARE VISIT-LEV 5 EST PT Modifier: Quantity: 1 Physician Procedures : CPT4 Code Description Modifier [id number] - WC PHYS LEVEL 3 - EST PT ICD-10 Diagnosis Description S81.802A Unspecified open wound, left lower leg, initial encounter L97.918 Non-pressure chronic ulcer of unspecified part of right lower leg with other  specified severity L97.514 Non-pressure chronic ulcer of other part of right foot with necrosis of bone I73.9 Peripheral vascular disease, unspecified Quantity: 1 Electronic Signature(s) Signed: 01/09/2022 11:39:44 AM By: Kalman Shan DO Entered By: Kalman Shan on 01/09/2022 10:57:02

## 2022-01-10 NOTE — Progress Notes (Signed)
DRAYSEN, WEYGANDT ([id number]) [id number] Page 1 of 12 Visit Report for 01/09/2022 Arrival Information Details Patient Name: Date of Service: LANORRIS, KALISZ MES L. 01/09/2022 9:30 A M Medical Record Number: [id number] Patient Account Number: [id number] Date of Birth/Sex: Treating RN: September 26, 1935 (87 y.o. M) Primary Care Willamina Grieshop: Dorthy Cooler, Dibas Other Clinician: Referring Aurelio Mccamy: Treating Rosabella Edgin/Extender: Alanda Slim, Dibas Weeks in Treatment: 60 Visit Information History Since Last Visit Added or deleted any medications: No Patient Arrived: Walker Any new allergies or adverse reactions: No Arrival Time: 09:48 Had a fall or experienced change in No Accompanied By: self activities of daily living that may affect Transfer Assistance: Manual risk of falls: Patient Identification Verified: Yes Signs or symptoms of abuse/neglect since last visito No Secondary Verification Process Completed: Yes Hospitalized since last visit: No Patient Requires Transmission-Based Precautions: No Implantable device outside of the clinic excluding No Patient Has Alerts: Yes cellular tissue based products placed in the center Patient Alerts: Patient on Blood Thinner since last visit: ABI R=.58 TBI R=[place] Has Dressing in Place as Prescribed: Yes Pain Present Now: Yes Electronic Signature(s) Signed: 01/09/2022 1:57:33 PM By: Sandre Kitty Entered By: Sandre Kitty on 01/09/2022 09:49:05 -------------------------------------------------------------------------------- Clinic Level of Care Assessment Details Patient Name: Date of Service: TWAIN, STENSETH MES L. 01/09/2022 9:30 A M Medical Record Number: [id number] Patient Account Number: [id number] Date of Birth/Sex: Treating RN: 1935/01/26 (87 y.o. [name] Primary Care Soua Lenk: Dorthy Cooler, Dibas Other Clinician: Referring Alizia Greif: Treating Leane Loring/Extender: Alanda Slim, Dibas Weeks in Treatment: 19 Clinic  Level of Care Assessment Items TOOL 4 Quantity Score X- 1 0 Use when only an EandM is performed on FOLLOW-UP visit ASSESSMENTS - Nursing Assessment / Reassessment X- 1 10 Reassessment of Co-morbidities (includes updates in patient status) X- 1 5 Reassessment of Adherence to Treatment Plan ASSESSMENTS - Wound and Skin A ssessment / Reassessment '[]'$  - 0 Simple Wound Assessment / Reassessment - one wound X- 3 5 Complex Wound Assessment / Reassessment - multiple wounds X- 1 10 Dermatologic / Skin Assessment (not related to wound area) ASSESSMENTS - Focused Assessment '[]'$  - 0 Circumferential Edema Measurements - multi extremities X- 1 10 Nutritional Assessment / Counseling / Intervention DAMETRIUS, SANJUAN ([id number]) [id number] Page 2 of 12 '[]'$  - 0 Lower Extremity Assessment (monofilament, tuning fork, pulses) '[]'$  - 0 Peripheral Arterial Disease Assessment (using hand held doppler) ASSESSMENTS - Ostomy and/or Continence Assessment and Care '[]'$  - 0 Incontinence Assessment and Management '[]'$  - 0 Ostomy Care Assessment and Management (repouching, etc.) PROCESS - Coordination of Care '[]'$  - 0 Simple Patient / Family Education for ongoing care X- 1 20 Complex (extensive) Patient / Family Education for ongoing care X- 1 10 Staff obtains Programmer, systems, Records, T Results / Process Orders est '[]'$  - 0 Staff telephones HHA, Nursing Homes / Clarify orders / etc '[]'$  - 0 Routine Transfer to another Facility (non-emergent condition) '[]'$  - 0 Routine Hospital Admission (non-emergent condition) '[]'$  - 0 New Admissions / Biomedical engineer / Ordering NPWT Apligraf, etc. , '[]'$  - 0 Emergency Hospital Admission (emergent condition) '[]'$  - 0 Simple Discharge Coordination X- 1 15 Complex (extensive) Discharge Coordination PROCESS - Special Needs '[]'$  - 0 Pediatric / Minor Patient Management '[]'$  - 0 Isolation Patient Management '[]'$  - 0 Hearing / Language / Visual special needs '[]'$   - 0 Assessment of Community assistance (transportation, D/C planning, etc.) '[]'$  - 0 Additional assistance / Altered mentation '[]'$  - 0 Support Surface(s) Assessment (bed, cushion, seat, etc.) INTERVENTIONS - Wound Cleansing /  Measurement '[]'$  - 0 Simple Wound Cleansing - one wound X- 3 5 Complex Wound Cleansing - multiple wounds X- 1 5 Wound Imaging (photographs - any number of wounds) '[]'$  - 0 Wound Tracing (instead of photographs) '[]'$  - 0 Simple Wound Measurement - one wound X- 3 5 Complex Wound Measurement - multiple wounds INTERVENTIONS - Wound Dressings X - Small Wound Dressing one or multiple wounds 3 10 '[]'$  - 0 Medium Wound Dressing one or multiple wounds '[]'$  - 0 Large Wound Dressing one or multiple wounds X- 1 5 Application of Medications - topical '[]'$  - 0 Application of Medications - injection INTERVENTIONS - Miscellaneous '[]'$  - 0 External ear exam '[]'$  - 0 Specimen Collection (cultures, biopsies, blood, body fluids, etc.) '[]'$  - 0 Specimen(s) / Culture(s) sent or taken to Lab for analysis '[]'$  - 0 Patient Transfer (multiple staff / Civil Service fast streamer / Similar devices) '[]'$  - 0 Simple Staple / Suture removal (25 or less) '[]'$  - 0 Complex Staple / Suture removal (26 or more) '[]'$  - 0 Hypo / Hyperglycemic Management (close monitor of Blood Glucose) TREYTEN, MONESTIME L ([id number]) [id number] Page 3 of 12 '[]'$  - 0 Ankle / Brachial Index (ABI) - do not check if billed separately X- 1 5 Vital Signs Has the patient been seen at the hospital within the last three years: Yes Total Score: 170 Level Of Care: New/Established - Level 5 Electronic Signature(s) Signed: 01/09/2022 4:55:01 PM By: Deon Pilling RN, BSN Entered By: Deon Pilling on 01/09/2022 10:34:22 -------------------------------------------------------------------------------- Encounter Discharge Information Details Patient Name: Date of Service: [name] 01/09/2022 9:30 A M Medical Record Number:  [id number] Patient Account Number: [id number] Date of Birth/Sex: Treating RN: 19-Nov-1935 (87 y.o. [name] Primary Care Rheta Hemmelgarn: Dorthy Cooler, Dibas Other Clinician: Referring Lavonte Palos: Treating Danell Vazquez/Extender: Alanda Slim, Dibas Weeks in Treatment: 41 Encounter Discharge Information Items Discharge Condition: Stable Ambulatory Status: Walker Discharge Destination: Home Transportation: Private Auto Accompanied By: daughter Schedule Follow-up Appointment: Yes Clinical Summary of Care: Electronic Signature(s) Signed: 01/09/2022 4:55:01 PM By: Deon Pilling RN, BSN Entered By: Deon Pilling on 01/09/2022 10:35:58 -------------------------------------------------------------------------------- Lower Extremity Assessment Details Patient Name: Date of Service: [name] 01/09/2022 9:30 A M Medical Record Number: [id number] Patient Account Number: [id number] Date of Birth/Sex: Treating RN: Nov 07, 1935 (87 y.o. [name] Primary Care Jazlene Bares: Dorthy Cooler, Dibas Other Clinician: Referring Alexus Michael: Treating Devrin Monforte/Extender: Alanda Slim, Dibas Weeks in Treatment: 19 Edema Assessment Assessed: [Left: No] [Right: No] Edema: [Left: Ye] [Right: s] Calf Left: Right: Point of Measurement: 42 cm From Medial Instep 38 cm Ankle Left: Right: Point of Measurement: 8 cm From Medial Instep 22.6 cm Electronic Signature(s) Signed: 01/09/2022 4:55:01 PM By: Deon Pilling RN, BSN Dierdre Highman L 01/09/2022 4:55:01 PM By: Deon Pilling RN, BSN Signed: ([id number]) [id number] Page 4 of 12 Entered By: Deon Pilling on 01/09/2022 10:08:28 -------------------------------------------------------------------------------- Multi Wound Chart Details Patient Name: Date of Service: ROLF, FELLS MES L. 01/09/2022 9:30 A M Medical Record Number: [id number] Patient Account Number: [id number] Date of Birth/Sex: Treating RN: [birth year] (87 y.o. M) Primary Care  Breia Ocampo: Dorthy Cooler, Dibas Other Clinician: Referring Koy Lamp: Treating Young Mulvey/Extender: Alanda Slim, Dibas Weeks in Treatment: 19 Vital Signs Height(in): 68 Pulse(bpm): 79 Weight(lbs): 198 Blood Pressure(mmHg): 113/72 Body Mass Index(BMI): 30.1 Temperature(F): 98.3 Respiratory Rate(breaths/min): 18 [2:Photos:] Right T Great oe Right, Anterior Ankle Left, Anterior Ankle Wound Location: Trauma Shear/Friction Gradually Appeared Wounding Event: Diabetic Wound/Ulcer of the Lower Diabetic Wound/Ulcer of the Lower Diabetic Wound/Ulcer of the Lower Primary  Etiology: Extremity Extremity Extremity Abrasion Pressure Ulcer N/A Secondary Etiology: Cataracts, Angina, Type II Diabetes, Cataracts, Angina, Type II Diabetes, Cataracts, Angina, Type II Diabetes, Comorbid History: Gout, Osteoarthritis, Neuropathy Gout, Osteoarthritis, Neuropathy Gout, Osteoarthritis, Neuropathy 11/10/2021 12/04/2021 01/02/2022 Date Acquired: 7 4 0 Weeks of Treatment: Open Open Open Wound Status: No No No Wound Recurrence: 0.5x0.6x0.1 1.3x0.6x0.1 2.3x1.2x0.2 Measurements L x W x D (cm) 0.236 0.613 2.168 A (cm) : rea 0.024 0.061 0.434 Volume (cm) : 69.90% 42.60% N/A % Reduction in A rea: 69.60% 43.00% N/A % Reduction in Volume: Grade 2 Grade 2 Unable to visualize wound bed Classification: Medium None Present Medium Exudate A mount: Serosanguineous N/A Serosanguineous Exudate Type: red, brown N/A red, brown Exudate Color: Distinct, outline attached Distinct, outline attached Distinct, outline attached Wound Margin: None Present (0%) N/A None Present (0%) Granulation A mount: Large (67-100%) N/A Large (67-100%) Necrotic A mount: Adherent Slough Eschar Eschar, Adherent Slough Necrotic Tissue: Fat Layer (Subcutaneous Tissue): Yes Fascia: No Fascia: No Exposed Structures: Bone: Yes Fat Layer (Subcutaneous Tissue): No Fat Layer (Subcutaneous Tissue): No Fascia: No Tendon:  No Tendon: No Tendon: No Muscle: No Muscle: No Muscle: No Joint: No Joint: No Joint: No Bone: No Bone: No Small (1-33%) None None Epithelialization: Excoriation: No Excoriation: No Excoriation: No Periwound Skin Texture: Induration: No Induration: No Induration: No Callus: No Callus: No Callus: No Crepitus: No Crepitus: No Crepitus: No Rash: No Rash: No Rash: No Scarring: No Scarring: No Scarring: No Dry/Scaly: Yes Dry/Scaly: Yes Maceration: No Periwound Skin Moisture: Maceration: No Maceration: No Dry/Scaly: No Atrophie Blanche: No Atrophie Blanche: No Atrophie Blanche: No Periwound Skin Color: Cyanosis: No Cyanosis: No Cyanosis: No Ecchymosis: No Ecchymosis: No Ecchymosis: No Erythema: No Erythema: No Erythema: No DANIELA, HERNAN ([id number]) [id number] Page 5 of 12 Hemosiderin Staining: No Hemosiderin Staining: No Hemosiderin Staining: No Mottled: No Mottled: No Mottled: No Pallor: No Pallor: No Pallor: No Rubor: No Rubor: No Rubor: No Treatment Notes Electronic Signature(s) Signed: 01/09/2022 11:39:44 AM By: Kalman Shan DO Entered By: Kalman Shan on 01/09/2022 10:31:41 -------------------------------------------------------------------------------- Multi-Disciplinary Care Plan Details Patient Name: Date of Service: [name] 01/09/2022 9:30 A M Medical Record Number: [id number] Patient Account Number: [id number] Date of Birth/Sex: Treating RN: [birth year] (87 y.o. [name] Primary Care Bronco Mcgrory: Dorthy Cooler, Dibas Other Clinician: Referring Keirston Saephanh: Treating Judge Duque/Extender: Alanda Slim, Dibas Weeks in Treatment: 67 Active Inactive Pressure Nursing Diagnoses: Knowledge deficit related to management of pressures ulcers Goals: Patient/caregiver will verbalize understanding of pressure ulcer management Date Initiated: 08/26/2021 Target Resolution Date: 03/06/2022 Goal Status:  Active Interventions: Assess: immobility, friction, shearing, incontinence upon admission and as needed Assess offloading mechanisms upon admission and as needed Provide education on pressure ulcers Notes: Wound/Skin Impairment Nursing Diagnoses: Impaired tissue integrity Goals: Patient/caregiver will verbalize understanding of skin care regimen Date Initiated: 08/26/2021 Target Resolution Date: 05/01/2022 Goal Status: Active Ulcer/skin breakdown will have a volume reduction of 30% by week 4 Date Initiated: 08/26/2021 Date Inactivated: 01/09/2022 Target Resolution Date: 01/03/2022 Unmet Reason: see wound Goal Status: Unmet measurement. Interventions: Assess patient/caregiver ability to obtain necessary supplies Assess patient/caregiver ability to perform ulcer/skin care regimen upon admission and as needed Assess ulceration(s) every visit Provide education on ulcer and skin care Treatment Activities: Topical wound management initiated : 08/26/2021 Notes: Electronic Signature(s) DERL, ABALOS ([id number]) [phone].pdf Page 6 of 12 Signed: 01/09/2022 4:55:01 PM By: Deon Pilling RN, BSN Entered By: Deon Pilling on 01/09/2022 10:10:59 -------------------------------------------------------------------------------- Pain Assessment Details Patient Name: Date of Service:  BRICE, KOSSMAN MES L. 01/09/2022 9:30 A M Medical Record Number: [id number] Patient Account Number: [id number] Date of Birth/Sex: Treating RN: 11-17-1935 (87 y.o. M) Primary Care Stevan Eberwein: Dorthy Cooler, Dibas Other Clinician: Referring Melanie Pellot: Treating Jannessa Ogden/Extender: Alanda Slim, Dibas Weeks in Treatment: 3 Active Problems Location of Pain Severity and Description of Pain Patient Has Paino Yes Site Locations Rate the pain. Current Pain Level: 5 Pain Management and Medication Current Pain Management: Electronic Signature(s) Signed: 01/09/2022 1:57:33 PM By: Sandre Kitty Entered  By: Sandre Kitty on 01/09/2022 09:49:30 -------------------------------------------------------------------------------- Patient/Caregiver Education Details Patient Name: Date of Service: [name] 1/5/2024andnbsp9:30 A M Medical Record Number: [id number] Patient Account Number: [id number] Date of Birth/Gender: Treating RN: 12-Aug-1935 (87 y.o. [name] Primary Care Physician: Dorthy Cooler, Dibas Other Clinician: Referring Physician: Treating Physician/Extender: Alanda Slim, Dibas Weeks in Treatment: 38 Education Assessment Education Provided To: Patient BRECKON, REEVES ([id number]) [id number] Page 7 of 12 Education Topics Provided Wound/Skin Impairment: Handouts: Caring for Your Ulcer Methods: Explain/Verbal Responses: Reinforcements needed Electronic Signature(s) Signed: 01/09/2022 4:55:01 PM By: Deon Pilling RN, BSN Entered By: Deon Pilling on 01/09/2022 10:11:56 -------------------------------------------------------------------------------- Wound Assessment Details Patient Name: Date of Service: [name] 01/09/2022 9:30 A M Medical Record Number: [id number] Patient Account Number: [id number] Date of Birth/Sex: Treating RN: 08-17-1935 (87 y.o. [name] Primary Care Meryl Hubers: Dorthy Cooler, Dibas Other Clinician: Referring Tamaka Sawin: Treating Mattye Verdone/Extender: Alanda Slim, Dibas Weeks in Treatment: 19 Wound Status Wound Number: 2 Primary Etiology: Diabetic Wound/Ulcer of the Lower Extremity Wound Location: Right T Great oe Secondary Abrasion Etiology: Wounding Event: Trauma Wound Status: Open Date Acquired: 11/10/2021 Comorbid Cataracts, Angina, Type II Diabetes, Gout, Osteoarthritis, Weeks Of Treatment: 7 History: Neuropathy Clustered Wound: No Photos Wound Measurements Length: (cm) 0.5 Width: (cm) 0.6 Depth: (cm) 0.1 Area: (cm) 0.236 Volume: (cm) 0.024 % Reduction in Area: 69.9% %  Reduction in Volume: 69.6% Epithelialization: Small (1-33%) Tunneling: No Undermining: No Wound Description Classification: Grade 2 Wound Margin: Distinct, outline attached Exudate Amount: Medium Exudate Type: Serosanguineous Exudate Color: red, brown Foul Odor After Cleansing: No Slough/Fibrino Yes Wound Bed Granulation Amount: None Present (0%) Exposed Structure Necrotic Amount: Large (67-100%) Fascia Exposed: No Necrotic Quality: Adherent Slough Fat Layer (Subcutaneous Tissue) Exposed: Yes Tendon Exposed: No Muscle Exposed: No Joint Exposed: No Bone Exposed: Yes DAKHARI, ZUVER L ([id number]) [id number] Page 8 of 12 Periwound Skin Texture Texture Color No Abnormalities Noted: No No Abnormalities Noted: No Callus: No Atrophie Blanche: No Crepitus: No Cyanosis: No Excoriation: No Ecchymosis: No Induration: No Erythema: No Rash: No Hemosiderin Staining: No Scarring: No Mottled: No Pallor: No Moisture Rubor: No No Abnormalities Noted: No Dry / Scaly: Yes Maceration: No Treatment Notes Wound #2 (Toe Great) Wound Laterality: Right Cleanser Soap and Water Discharge Instruction: May shower and wash wound with dial antibacterial soap and water prior to dressing change. Peri-Wound Care Skin Prep Discharge Instruction: Use skin prep as directed Topical Primary Dressing MediHoney Gel, tube 1.5 (oz) Discharge Instruction: Apply to wound bed as instructed Hydrofera Blue Ready Foam, 2.5 x2.5 in Discharge Instruction: Apply to wound bed as instructed Secondary Dressing Woven Gauze Sponges 2x2 in Discharge Instruction: Apply over primary dressing as directed. Secured With 26M Medipore H Soft Cloth Surgical T ape, 4 x 10 (in/yd) Discharge Instruction: Secure with tape as directed. Compression Wrap Compression Stockings Add-Ons Cotton Tip Swabs Electronic Signature(s) Signed: 01/09/2022 4:55:01 PM By: Deon Pilling RN, BSN Entered By: Deon Pilling on 01/09/2022 10:08:53 -------------------------------------------------------------------------------- Wound Assessment Details Patient  Name: Date of Service: MELL, GUIA MES L. 01/09/2022 9:30 A M Medical Record Number: [id number] Patient Account Number: [id number] Date of Birth/Sex: Treating RN: [birth year] (87 y.o. [name] Primary Care Tonishia Steffy: Dorthy Cooler, Dibas Other Clinician: Referring Kiala Faraj: Treating Rosamond Andress/Extender: Alanda Slim, Dibas Weeks in Treatment: 19 Wound Status Wound Number: 3 Primary Etiology: Diabetic Wound/Ulcer of the Lower Extremity Wound Location: Right, Anterior Ankle Secondary Pressure Ulcer Etiology: Wounding Event: Shear/Friction Wound Status: Open Date Acquired: 12/04/2021 FOUAD, TAUL ([id number]) [id number] Page 9 of 12 Comorbid Cataracts, Angina, Type II Diabetes, Gout, Osteoarthritis, Weeks Of Treatment: 4 History: Neuropathy Clustered Wound: No Photos Wound Measurements Length: (cm) 1.3 Width: (cm) 0.6 Depth: (cm) 0.1 Area: (cm) 0.613 Volume: (cm) 0.061 % Reduction in Area: 42.6% % Reduction in Volume: 43% Epithelialization: None Wound Description Classification: Grade 2 Wound Margin: Distinct, outline attached Exudate Amount: None Present Foul Odor After Cleansing: No Slough/Fibrino No Wound Bed Necrotic Amount: Large (67-100%) Exposed Structure Necrotic Quality: Eschar Fascia Exposed: No Fat Layer (Subcutaneous Tissue) Exposed: No Tendon Exposed: No Muscle Exposed: No Joint Exposed: No Bone Exposed: No Periwound Skin Texture Texture Color No Abnormalities Noted: No No Abnormalities Noted: No Callus: No Atrophie Blanche: No Crepitus: No Cyanosis: No Excoriation: No Ecchymosis: No Induration: No Erythema: No Rash: No Hemosiderin Staining: No Scarring: No Mottled: No Pallor: No Moisture Rubor: No No Abnormalities Noted: No Dry / Scaly: Yes Maceration:  No Treatment Notes Wound #3 (Ankle) Wound Laterality: Right, Anterior Cleanser Soap and Water Discharge Instruction: May shower and wash wound with dial antibacterial soap and water prior to dressing change. Peri-Wound Care Skin Prep Discharge Instruction: Use skin prep as directed Topical Primary Dressing MediHoney Gel, tube 1.5 (oz) Discharge Instruction: Apply to wound bed as instructed Hydrofera Blue Ready Foam, 2.5 x2.5 in Discharge Instruction: Apply to wound bed as instructed Secondary Dressing RENZO, VINCELETTE ([id number]) [id number] Page 10 of 12 Bordered Gauze, 2x2 in Discharge Instruction: Apply over primary dressing as directed. Secured With Compression Wrap Compression Stockings Add-Ons Cotton Tip Swabs Electronic Signature(s) Signed: 01/09/2022 4:55:01 PM By: Deon Pilling RN, BSN Entered By: Deon Pilling on 01/09/2022 09:57:20 -------------------------------------------------------------------------------- Wound Assessment Details Patient Name: Date of Service: [name] 01/09/2022 9:30 A M Medical Record Number: [id number] Patient Account Number: [id number] Date of Birth/Sex: Treating RN: [birth year] (87 y.o. [name] Primary Care Taylen Wendland: Dorthy Cooler, Dibas Other Clinician: Referring Zulma Court: Treating Aidel Davisson/Extender: Alanda Slim, Dibas Weeks in Treatment: 19 Wound Status Wound Number: 4 Primary Diabetic Wound/Ulcer of the Lower Extremity Etiology: Wound Location: Left, Anterior Ankle Wound Status: Open Wounding Event: Gradually Appeared Comorbid Cataracts, Angina, Type II Diabetes, Gout, Osteoarthritis, Date Acquired: 01/02/2022 History: Neuropathy Weeks Of Treatment: 0 Clustered Wound: No Photos Wound Measurements Length: (cm) 2.3 Width: (cm) 1.2 Depth: (cm) 0.2 Area: (cm) 2.168 Volume: (cm) 0.434 % Reduction in Area: % Reduction in Volume: Epithelialization: None Tunneling: No Undermining:  No Wound Description Classification: Unable to visualize wound bed Wound Margin: Distinct, outline attached Exudate Amount: Medium Exudate Type: Serosanguineous Exudate Color: red, brown Foul Odor After Cleansing: No Slough/Fibrino Yes Wound Bed Granulation Amount: None Present (0%) Exposed Structure Necrotic Amount: Large (67-100%) Fascia Exposed: No Necrotic Quality: Eschar, Adherent Slough Fat Layer (Subcutaneous Tissue) Exposed: No Tendon Exposed: No Muscle Exposed: No LAW, CORSINO L ([id number]) [id number] Page 11 of 12 Joint Exposed: No Bone Exposed: No Periwound Skin Texture Texture Color No Abnormalities Noted: No No Abnormalities Noted: No Callus: No Atrophie Blanche: No Crepitus: No Cyanosis:  No Excoriation: No Ecchymosis: No Induration: No Erythema: No Rash: No Hemosiderin Staining: No Scarring: No Mottled: No Pallor: No Moisture Rubor: No No Abnormalities Noted: No Dry / Scaly: No Maceration: No Treatment Notes Wound #4 (Ankle) Wound Laterality: Left, Anterior Cleanser Soap and Water Discharge Instruction: May shower and wash wound with dial antibacterial soap and water prior to dressing change. Peri-Wound Care Skin Prep Discharge Instruction: Use skin prep as directed Topical Primary Dressing MediHoney Gel, tube 1.5 (oz) Discharge Instruction: Apply to wound bed as instructed Hydrofera Blue Ready Foam, 2.5 x2.5 in Discharge Instruction: Apply to wound bed as instructed Secondary Dressing Bordered Gauze, 2x2 in Discharge Instruction: Apply over primary dressing as directed. Secured With Compression Wrap Compression Stockings Add-Ons Cotton Tip Swabs Electronic Signature(s) Signed: 01/09/2022 4:55:01 PM By: Deon Pilling RN, BSN Entered By: Deon Pilling on 01/09/2022 10:09:33 -------------------------------------------------------------------------------- Vitals Details Patient Name: Date of Service: [name]  MES L. 01/09/2022 9:30 A M Medical Record Number: [id number] Patient Account Number: [id number] Date of Birth/Sex: Treating RN: 10-18-1935 (87 y.o. M) Primary Care Airyonna Franklyn: Dorthy Cooler, Dibas Other Clinician: Referring Miria Cappelli: Treating Euclide Granito/Extender: Alanda Slim, Dibas Weeks in Treatment: 48 Vital Signs Time Taken: 09:49 Temperature (F): 98.3 Height (in): 68 Pulse (bpm): 79 Weight (lbs): 198 Respiratory Rate (breaths/min): 18 Malenfant, Humza L ([id number]) [id number] Page 12 of 12 Body Mass Index (BMI): 30.1 Blood Pressure (mmHg): 113/72 Reference Range: 80 - 120 mg / dl Electronic Signature(s) Signed: 01/09/2022 1:57:33 PM By: Sandre Kitty Entered By: Sandre Kitty on 01/09/2022 09:49:21

## 2022-01-16 ENCOUNTER — Encounter: Payer: Self-pay | Admitting: Physician Assistant

## 2022-01-16 ENCOUNTER — Other Ambulatory Visit: Payer: Self-pay | Admitting: Internal Medicine

## 2022-01-16 ENCOUNTER — Ambulatory Visit (INDEPENDENT_AMBULATORY_CARE_PROVIDER_SITE_OTHER): Payer: Medicare Other | Admitting: Physician Assistant

## 2022-01-16 VITALS — BP 121/69 | HR 69 | Temp 98.6°F | Resp 20 | Ht 68.0 in | Wt 199.7 lb

## 2022-01-16 DIAGNOSIS — I70223 Atherosclerosis of native arteries of extremities with rest pain, bilateral legs: Secondary | ICD-10-CM | POA: Diagnosis not present

## 2022-01-16 DIAGNOSIS — I872 Venous insufficiency (chronic) (peripheral): Secondary | ICD-10-CM

## 2022-01-16 DIAGNOSIS — I7025 Atherosclerosis of native arteries of other extremities with ulceration: Secondary | ICD-10-CM | POA: Diagnosis not present

## 2022-01-16 NOTE — Progress Notes (Signed)
Office Note     CC:  follow up Requesting Provider:  Lujean Amel, MD  HPI: [name] is a 87 y.o. (12/22/1935) male who presents for follow up wound check. He is status post bilateral iliofemoral endarterectomy with bilateral common iliac artery stents on 10/09/2021 by Dr. Virl Cagey. 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 At his last follow up visit in December his right heel wound had healed however he had developed a new ulcer on his right great toe and right ankle. He has been under the care of the wound car center for management of these. His non invasive studies at his last visit showed adequate perfusion of his RLE and patency of his iliac stents.    He returns today for check on his right foot ulcers. He feels that the ulcer on the right leg and great toe are improving, however he now has a new wound on left anterior distal leg that has been present for a couple weeks now. He says it started off as just some skin peeling off but when the skin came off there was a wound under. He has a had continued swelling in BLE and weeping. He does report elevating his legs in his adjustable bed at night. He has compression stockings but does not currently wear them because they make his legs hurt. He otherwise continues to walk with rolling walker. He sees wound care every 2 weeks for management of his ulcers. He is currently using Medi honey and gauze.   The pt is on a statin for cholesterol management.  The pt is on a daily aspirin.   Other AC:  Plavix The pt is on ARB, BB for hypertension.   The pt is diabetic.   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   Psoas abscess, right ([facility])    SDH (subdural hematoma) ([place]) 05/12/2014   Sepsis ([place]) 05/04/2014   Septic arthritis of hip ([place]) 05/18/2014   Stenosis of right carotid artery    40-59 percent    Streptococcal bacteremia 05/09/2014    Past Surgical History:  Procedure Laterality Date   ABDOMINAL AORTOGRAM W/LOWER EXTREMITY N/A 09/24/2021   Procedure: ABDOMINAL AORTOGRAM W/LOWER EXTREMITY;  Surgeon: [name], MD;  Location: [facility];  Service: Cardiovascular;  Laterality: N/A;   AORTOGRAM  10/09/2021   Procedure: AORTOGRAM;  Surgeon: [name], MD;  Location: [facility];  Service: Vascular;;   APPLICATION OF WOUND VAC Bilateral 10/09/2021   Procedure: APPLICATION OF WOUND VAC;  Surgeon: [name], MD;  Location: [facility];  Service: Vascular;  Laterality: Bilateral;   BONE BIOPSY Right 08/13/2021   Procedure: BONE BIOPSY;  Surgeon: Trula Slade, DPM;  Location: [facility];  Service: Podiatry;  Laterality: Right;   ENDARTERECTOMY Bilateral 10/09/2021   Procedure: ILIOFEMORAL ENDARTERECTOMY;  Surgeon: [name], MD;  Location: [facility];  Service: Vascular;  Laterality: Bilateral;   HIP ARTHROPLASTY Right    INSERTION OF ILIAC STENT Bilateral 10/09/2021   Procedure: INSERTION OF RETROGRADE ILIAC STENT;  Surgeon:  [name], MD;  Location: [facility];  Service: Vascular;  Laterality: Bilateral;   JOINT REPLACEMENT     right hip, bilateral knees   left femur surgery Left    with rod   right cataract extraction     TONSILLECTOMY     TOTAL SHOULDER ARTHROPLASTY Bilateral    ULTRASOUND GUIDANCE FOR VASCULAR ACCESS Bilateral 10/09/2021   Procedure: ULTRASOUND GUIDANCE FOR VASCULAR ACCESS;  Surgeon: [name], MD;  Location: [facility];  Service: Vascular;  Laterality: Bilateral;   WOUND DEBRIDEMENT Right 08/13/2021   Procedure: DEBRIDEMENT WOUND;  Surgeon: Trula Slade, DPM;  Location: [facility];   Service: Podiatry;  Laterality: Right;    Social History   Socioeconomic History   Marital status: Divorced    Spouse name: Not on file   Number of children: Not on file   Years of education: Not on file   Highest education level: Not on file  Occupational History   Not on file  Tobacco Use   Smoking status: Former    Packs/day: 25.00    Types: Cigarettes    Passive exposure: Never   Smokeless tobacco: Never  Vaping Use   Vaping Use: Never used  Substance and Sexual Activity   Alcohol use: Yes    Comment: 4 beers daily   Drug use: Not Currently   Sexual activity: Never  Other Topics Concern   Not on file  Social History Narrative   Not on file   Social Determinants of Health   Financial Resource Strain: Not on file  Food Insecurity: No Food Insecurity (10/11/2021)   Hunger Vital Sign    Worried About Running Out of Food in the Last Year: Never true    Ran Out of Food in the Last Year: Never true  Transportation Needs: No Transportation Needs (10/11/2021)   PRAPARE - Hydrologist (Medical): No    Lack of Transportation (Non-Medical): No  Physical Activity: Not on file  Stress: Not on file  Social Connections: Not on file  Intimate Partner Violence: Not At Risk (10/11/2021)   Humiliation, Afraid, Rape, and Kick questionnaire    Fear of Current or Ex-Partner: No    Emotionally Abused: No    Physically Abused: No    Sexually Abused: No    Family History  Problem Relation Age of Onset   Diabetes Mellitus II Daughter     Current Outpatient Medications  Medication Sig Dispense Refill   amoxicillin (AMOXIL) 500 MG capsule TAKE 1 CAPSULE BY MOUTH TWICE A DAY 60 capsule 2   APPLE CIDER VINEGAR PO Take 1 capsule by mouth daily.     aspirin EC 81 MG tablet Take 81 mg by mouth every other day. Swallow whole.     atenolol (TENORMIN) 50 MG tablet Take 50 mg by mouth daily.     cefadroxil (DURICEF) 500 MG capsule Take 1 capsule (500 mg total)  by mouth 2 (two) times daily. 60 capsule 1   clopidogrel (PLAVIX) 75 MG tablet TAKE 1 TABLET (75 MG TOTAL) BY MOUTH DAILY AT 6 (SIX) AM. 90 tablet 1   doxycycline (VIBRA-TABS) 100 MG tablet Take 1 tablet (100 mg total) by mouth 2 (two) times daily. 60 tablet 1   fluticasone (FLONASE) 50 MCG/ACT nasal spray Place 2 sprays into both nostrils daily.     furosemide (LASIX) 20 MG tablet Take 20 mg by mouth daily.     hydrALAZINE (APRESOLINE) 25 MG tablet Take  1 tablet (25 mg total) by mouth every 8 (eight) hours.     irbesartan (AVAPRO) 75 MG tablet Take 75 mg by mouth daily.     LYRICA 75 MG capsule Take 75-150 mg by mouth See admin instructions. Take 75 mg by mouth in the morning and 150 mg at night     meloxicam (MOBIC) 7.5 MG tablet Take 7.5 mg by mouth daily.     metFORMIN (GLUCOPHAGE) 1000 MG tablet Take 1,000 mg by mouth in the morning and at bedtime.     Multiple Vitamin (MULTIVITAMIN WITH MINERALS) TABS tablet Take 1 tablet by mouth daily.     neomycin-bacitracin-polymyxin (NEOSPORIN) [phone] ointment Apply 1 Application topically daily as needed (wound care).     OVER THE COUNTER MEDICATION Take 1 capsule by mouth daily. Balance of Nature Fruits     OVER THE COUNTER MEDICATION Take 1 tablet by mouth daily. Beet supplement     protein supplement shake (PREMIER PROTEIN) LIQD Take 11 oz by mouth daily.     rosuvastatin (CRESTOR) 20 MG tablet Take 1 tablet (20 mg total) by mouth daily. 90 tablet 3   Tetrahydrozoline HCl (VISINE OP) Place 1 drop into both eyes daily as needed (burning).     traMADol (ULTRAM) 50 MG tablet Take 2 tablets (100 mg total) by mouth every 6 (six) hours as needed. Every 4 hours 30 tablet 0   ULORIC 80 MG TABS Take 80 mg by mouth daily.  3   No current facility-administered medications for this visit.    Allergies  Allergen Reactions   Allopurinol Other (See Comments)    Makes head 'feel funny'   Atorvastatin Other (See Comments)    Unknown reaction    Motrin  [Ibuprofen] Other (See Comments)    Blisters in groin area     REVIEW OF SYSTEMS:  '[X]'$  denotes positive finding, '[ ]'$  denotes negative finding Cardiac  Comments:  Chest pain or chest pressure:    Shortness of breath upon exertion:    Short of breath when lying flat:    Irregular heart rhythm:        Vascular    Pain in calf, thigh, or hip brought on by ambulation:    Pain in feet at night that wakes you up from your sleep:     Blood clot in your veins:    Leg swelling:         Pulmonary    Oxygen at home:    Productive cough:     Wheezing:         Neurologic    Sudden weakness in arms or legs:     Sudden numbness in arms or legs:     Sudden onset of difficulty speaking or slurred speech:    Temporary loss of vision in one eye:     Problems with dizziness:         Gastrointestinal    Blood in stool:     Vomited blood:         Genitourinary    Burning when urinating:     Blood in urine:        Psychiatric    Major depression:         Hematologic    Bleeding problems:    Problems with blood clotting too easily:        Skin    Rashes or ulcers:        Constitutional    Fever or chills:  PHYSICAL EXAMINATION:  Vitals:   01/16/22 1331  BP: 121/69  Pulse: 69  Resp: 20  Temp: 98.6 F (37 C)  TempSrc: Temporal  SpO2: 96%  Weight: 199 lb 11.2 oz (90.6 kg)  Height: '5\' 8"'$  (1.727 m)    General:  WDWN in NAD; vital signs documented above Gait: Normal HENT: WNL, normocephalic Pulmonary: normal non-labored breathing Cardiac: regular HR Vascular Exam/Pulses: right doppler monophasic DP/ PT, left faint monophasic DP and pero signals Extremities: without ischemic changes, without Gangrene , without cellulitis; with open wounds  Dry eschar left posterior leg  Right distal medial leg ulcer  Left anterior distal leg ulcer and right great toe ulcer Musculoskeletal: no muscle wasting or atrophy  Neurologic: A&O X 3;  No focal weakness or paresthesias are  detected Psychiatric:  The pt has Normal affect.    ASSESSMENT/PLAN:: 87 y.o. male here for follow up wound check. S/p Bilateral iliofemoral endarterectomy with bilateral common iliac artery stents on 10/09/2021 by Dr.Robins. Bilateral groin incisions well appearing. His right heel wound resolved after surgery however he developed right 1st toe and ankle ulcer. These are about unchanged from 1 month ago. He now has new left anterior ankle ulcer. He is currently managed by wound care center. His non invasive studies at his last visit showed patient iliac stents and stable ABIs. However he has known SFA and Tibial disease bilaterally. He has mixed venous disease that I think is contributing to a lot of his wound healing difficulties. I have encouraged him to continue to elevate his legs daily. Would not recommend compression at this time with his dampened ABI especially on the left.  - recommend continued diligent wound care and keeping legs/ feet protected - continue Aspirin, Plavix, statin  - Will have him follow up in 2-3 weeks. If worsening or now improvement will likely need repeat Angiography   Karoline Caldwell, PA-C [facility] MD:   Trula Slade

## 2022-01-16 NOTE — Telephone Encounter (Signed)
Left vm for pt to call back regarding refill request.

## 2022-01-22 ENCOUNTER — Encounter (HOSPITAL_BASED_OUTPATIENT_CLINIC_OR_DEPARTMENT_OTHER): Payer: Medicare Other | Admitting: Internal Medicine

## 2022-01-22 DIAGNOSIS — E11621 Type 2 diabetes mellitus with foot ulcer: Secondary | ICD-10-CM | POA: Diagnosis not present

## 2022-01-22 DIAGNOSIS — L97518 Non-pressure chronic ulcer of other part of right foot with other specified severity: Secondary | ICD-10-CM | POA: Diagnosis not present

## 2022-01-22 DIAGNOSIS — Z87891 Personal history of nicotine dependence: Secondary | ICD-10-CM | POA: Diagnosis not present

## 2022-01-22 DIAGNOSIS — I739 Peripheral vascular disease, unspecified: Secondary | ICD-10-CM | POA: Diagnosis not present

## 2022-01-22 DIAGNOSIS — L97918 Non-pressure chronic ulcer of unspecified part of right lower leg with other specified severity: Secondary | ICD-10-CM

## 2022-01-22 DIAGNOSIS — E1142 Type 2 diabetes mellitus with diabetic polyneuropathy: Secondary | ICD-10-CM | POA: Diagnosis not present

## 2022-01-22 DIAGNOSIS — M86171 Other acute osteomyelitis, right ankle and foot: Secondary | ICD-10-CM | POA: Diagnosis not present

## 2022-01-22 DIAGNOSIS — L97514 Non-pressure chronic ulcer of other part of right foot with necrosis of bone: Secondary | ICD-10-CM | POA: Diagnosis not present

## 2022-01-22 DIAGNOSIS — S81802A Unspecified open wound, left lower leg, initial encounter: Secondary | ICD-10-CM

## 2022-01-22 DIAGNOSIS — Z7984 Long term (current) use of oral hypoglycemic drugs: Secondary | ICD-10-CM | POA: Diagnosis not present

## 2022-01-22 NOTE — Progress Notes (Signed)
[name], [name] ([id number]) [id number] Page 1 of 9 Visit Report for 01/22/2022 Chief Complaint Document Details Patient Name: Date of Service: [name], Kenneth MES L. 01/22/2022 2:00 PM Medical Record Number: [id number] Patient Account Number: [id number] Date of Birth/Sex: Treating RN: 01/22/1935 (87 y.o. M) Primary Care Provider: Dorthy Cooler, [place] Other Clinician: Referring Provider: Treating Provider/Extender: Alanda Slim, [place] Weeks in Treatment: 21 Information Obtained from: Patient Chief Complaint right foot wound, Right leg wound and left leg wound Electronic Signature(s) Signed: 01/22/2022 3:08:43 PM By: Kalman Shan DO Entered By: Kalman Shan on 01/22/2022 14:55:05 -------------------------------------------------------------------------------- HPI Details Patient Name: Date of Service: Kenneth Mons MES L. 01/22/2022 2:00 PM Medical Record Number: [id number] Patient Account Number: [id number] Date of Birth/Sex: Treating RN: 1935/03/30 (87 y.o. M) Primary Care Provider: Dorthy Cooler, [place] Other Clinician: Referring Provider: Treating Provider/Extender: Alanda Slim, [place] Weeks in Treatment: 21 History of Present Illness HPI Description: Admission 08/26/2021 [name] is an 87 year old male with a past medical history of controlled type 2 diabetes on oral agents with peripheral neuropathy and Atrial fibrillation not on blood thinners that presents to the clinic with a right foot wound that has waxed and waned in healing over the past year. He has peripheral neuropathy and is not sure when the wound started or how it started. He had an MRI done by his primary care physician on 08/03/2021 that showed potential early acute osteomyelitis at the posterior calcaneus. He was seen by Dr. Linus Salmons on 08/06/2021 and started on doxycycline and cefadroxil due to these findings. He was evaluated by podiatry, Dr. Jacqualyn Posey and had a bone biopsy done on  08/13/2021 that did not show evidence of osteomyelitis. He had a culture that showed MRSA sensitive to doxycycline. He also had ABIs with TBI's done on 8/2 that showed an ABI of 0.58 and a noncompressible TBI. He is scheduled to see vein and vascular on 8/30. Currently he is keeping the wound covered. He is using a soft surgical shoe. He denies signs of infection. 8/5; patient presents for follow-up. He had follow-up with Dr. Linus Salmons On 8/23. Plan is for 6 weeks of doxycycline through September 16. Although path was negative for osteo there is still concern due to positive culture from the bone. Unfortunately patient missed his vein and vascular appointment to assess blood flow status. This has been rescheduled for 9/8. Patient has been using Medihoney to the wound bed. He tries to offload the wound bed but has trouble at night keeping pressure off of it. He does not have Prevalon boots. He currently denies signs of infection. 9/19; patient presents for follow-up. He is scheduled to have a bilateral lower extremity angiogram on 9/20. He currently denies signs of infection. He has been using Medihoney to the wound bed. 11/13; patient has missed his last follow-up. In fact he has not been here in 2 months. He had bilateral iliofemoral endarterectomy with retrograde iliac stenting on 10/09/2021. He has been using Medihoney with Hydrofera Blue to the heel wound. He reports improvement in wound healing here. 2 weeks ago he developed a wound to the dorsal aspect of the great toe. He states His shoes cause this issue and he is currently wearing different shoes. He is on clindamycin as there was concern for infection to the right great toe. He currently denies signs of infection. He has been keeping the great toe open to air. 12/4; patient presents for follow-up. He has been using Medihoney and Hydrofera Blue to the heel wound and right  great toe wound. The heel wound is closed. The right great toe wound has bone  exposed. He now has a new wound to the right ankle that he states he used to shoehorn to scratch the area and created the wound. He has not been dressing this area. 12/21; patient presents for follow-up. He has been using Hydrofera Blue and Medihoney to the right anterior leg wound and right toe wound. There is been improvement in wound healing. He has been taking doxycycline prescribed at last clinic visit. He has no issues or complaints today. 1/5; patient presents for follow-up. He has been using Hydrofera Blue and Medihoney to the right anterior leg wound and right toe wound. Unfortunately has developed a new wound to the left lower extremity. He is not sure how this started. He has completed his course of doxycycline. He denies signs of infection. [name], [name] ([id number]) [id number] Page 2 of 9 He is scheduled to see vein and vascular for follow-up on 1/12. 1/18; patient presents for follow-up. He saw vein and vascular on 1/12 for evaluation of a new wound to his left lower extremity. They recommended he continue to elevate his legs. They stated that if the wound became worse or did not improve he will need repeat angiogram. Currently patient denies signs of infection. He has been using Medihoney and Hydrofera Blue to the wound beds. Electronic Signature(s) Signed: 01/22/2022 3:08:43 PM By: Kalman Shan DO Entered By: Kalman Shan on 01/22/2022 14:59:01 -------------------------------------------------------------------------------- Physical Exam Details Patient Name: Date of Service: Kenneth Mons MES L. 01/22/2022 2:00 PM Medical Record Number: [id number] Patient Account Number: [id number] Date of Birth/Sex: Treating RN: 24-Aug-1935 (87 y.o. M) Primary Care Provider: Dorthy Cooler, [place] Other Clinician: Referring Provider: Treating Provider/Extender: Alanda Slim, [place] Weeks in Treatment: 21 Constitutional respirations regular, non-labored and  within target range for patient.. Cardiovascular 2+ dorsalis pedis/posterior tibialis pulses. Psychiatric pleasant and cooperative. Notes T the dorsal aspect of the right great toe there is an open wound with granulation tissue. No bone exposed. T the right anterior ankle crease there is an open o o wound with dried non viable tissue/drainage and granulation tissue. No signs of infection. Wound to the left anterior leg with nonviable tissue throughout. Electronic Signature(s) Signed: 01/22/2022 3:08:43 PM By: Kalman Shan DO Entered By: Kalman Shan on 01/22/2022 14:59:46 -------------------------------------------------------------------------------- Physician Orders Details Patient Name: Date of Service: Kenneth Mons MES L. 01/22/2022 2:00 PM Medical Record Number: [id number] Patient Account Number: [id number] Date of Birth/Sex: Treating RN: 04/10/1935 (87 y.o. [name] Primary Care Provider: Dorthy Cooler, [place] Other Clinician: Referring Provider: Treating Provider/Extender: Alanda Slim, [place] Weeks in Treatment: 66 Verbal / Phone Orders: No Diagnosis Coding Follow-up Appointments ppointment in 2 weeks. - Dr. Heber [place] Return A Other: - F/U with Vein and Vascular if wounds get worse. Anesthetic (In clinic) Topical Lidocaine 5% applied to wound bed (In clinic) Topical Lidocaine 4% applied to wound bed Bathing/ Shower/ Hygiene May shower and wash wound with soap and water. BAYLON, SANTELLI ([id number]) [id number] Page 3 of 9 Edema Control - Lymphedema / SCD / Other Elevate legs to the level of the heart or above for 30 minutes daily and/or when sitting for 3-4 times a day throughout the day. Avoid standing for long periods of time. Moisturize legs daily. Off-Loading Open toe surgical shoe to: - Wear socks with your shoes Other: - -Keep pressure off area as much as is possible wear while resting in bed or chair- Prevalon Boot do  not  walk or stand with it. Additional Orders / Instructions Follow Nutritious Diet Wound Treatment Wound #2 - T Great oe Wound Laterality: Right Cleanser: Soap and Water 1 x Per Day/7 Days Discharge Instructions: May shower and wash wound with dial antibacterial soap and water prior to dressing change. Peri-Wound Care: Skin Prep (Generic) 1 x Per Day/7 Days Discharge Instructions: Use skin prep as directed Prim Dressing: MediHoney Gel, tube 1.5 (oz) 1 x Per Day/7 Days ary Discharge Instructions: Apply to wound bed as instructed Prim Dressing: Hydrofera Blue Ready Foam, 2.5 x2.5 in (Generic) 1 x Per Day/7 Days ary Discharge Instructions: Apply to wound bed as instructed Secondary Dressing: Woven Gauze Sponges 2x2 in 1 x Per Day/7 Days Discharge Instructions: Apply over primary dressing as directed. Secured With: 38M Medipore H Soft Cloth Surgical T ape, 4 x 10 (in/yd) 1 x Per Day/7 Days Discharge Instructions: Secure with tape as directed. Add-Ons: Cotton Tip Swabs (Generic) 1 x Per Day/7 Days Wound #3 - Ankle Wound Laterality: Right, Anterior Cleanser: Soap and Water 1 x Per Day/7 Days Discharge Instructions: May shower and wash wound with dial antibacterial soap and water prior to dressing change. Peri-Wound Care: Skin Prep (Generic) 1 x Per Day/7 Days Discharge Instructions: Use skin prep as directed Prim Dressing: MediHoney Gel, tube 1.5 (oz) 1 x Per Day/7 Days ary Discharge Instructions: Apply to wound bed as instructed Prim Dressing: Hydrofera Blue Ready Foam, 2.5 x2.5 in (Generic) 1 x Per Day/7 Days ary Discharge Instructions: Apply to wound bed as instructed Secondary Dressing: Bordered Gauze, 2x2 in 1 x Per Day/7 Days Discharge Instructions: Apply over primary dressing as directed. Add-Ons: Cotton Tip Swabs (Generic) 1 x Per Day/7 Days Wound #4 - Ankle Wound Laterality: Left, Anterior Cleanser: Soap and Water 1 x Per Day/7 Days Discharge Instructions: May shower and wash  wound with dial antibacterial soap and water prior to dressing change. Peri-Wound Care: Skin Prep (Generic) 1 x Per Day/7 Days Discharge Instructions: Use skin prep as directed Prim Dressing: MediHoney Gel, tube 1.5 (oz) 1 x Per Day/7 Days ary Discharge Instructions: Apply to wound bed as instructed Prim Dressing: Hydrofera Blue Ready Foam, 2.5 x2.5 in (Generic) 1 x Per Day/7 Days ary Discharge Instructions: Apply to wound bed as instructed Secondary Dressing: Bordered Gauze, 2x2 in 1 x Per Day/7 Days Discharge Instructions: Apply over primary dressing as directed. Add-Ons: Cotton Tip Swabs (Generic) 1 x Per Day/7 Days Electronic Signature(s) Signed: 01/22/2022 3:08:43 PM By: Kalman Shan DO Entered By: Kalman Shan on 01/22/2022 15:00:11 [name] ([id number]) [id number] Page 4 of 9 -------------------------------------------------------------------------------- Problem List Details Patient Name: Date of Service: [name], [name] MES L. 01/22/2022 2:00 PM Medical Record Number: [id number] Patient Account Number: [id number] Date of Birth/Sex: Treating RN: 1935-09-14 (87 y.o. M) Primary Care Provider: Dorthy Cooler, [place] Other Clinician: Referring Provider: Treating Provider/Extender: Alanda Slim, [place] Weeks in Treatment: 21 Active Problems ICD-10 Encounter Code Description Active Date MDM Diagnosis E11.621 Type 2 diabetes mellitus with foot ulcer 08/26/2021 No Yes I73.9 Peripheral vascular disease, unspecified 08/26/2021 No Yes M86.171 Other acute osteomyelitis, right ankle and foot 08/26/2021 No Yes E11.40 Type 2 diabetes mellitus with diabetic neuropathy, unspecified 08/26/2021 No Yes L97.514 Non-pressure chronic ulcer of other part of right foot with necrosis of bone 12/08/2021 No Yes L97.918 Non-pressure chronic ulcer of unspecified part of right lower leg with other 12/25/2021 No Yes specified severity S81.802A Unspecified open wound, left  lower leg, initial encounter 01/09/2022 No Yes Inactive Problems Resolved Problems  Electronic Signature(s) Signed: 01/22/2022 3:08:43 PM By: Kalman Shan DO Entered By: Kalman Shan on 01/22/2022 14:54:52 -------------------------------------------------------------------------------- Progress Note Details Patient Name: Date of Service: Kenneth Mons MES L. 01/22/2022 2:00 PM Medical Record Number: [id number] Patient Account Number: [id number] Date of Birth/Sex: Treating RN: 07/23/1935 (87 y.o. M) Primary Care Provider: [place], [place] Other Clinician: [place], [name] ([id number]) [id number] Page 5 of 9 Referring Provider: Treating Provider/Extender: Alanda Slim, [place] Weeks in Treatment: 21 Subjective Chief Complaint Information obtained from Patient right foot wound, Right leg wound and left leg wound History of Present Illness (HPI) Admission 08/26/2021 [name] is an 87 year old male with a past medical history of controlled type 2 diabetes on oral agents with peripheral neuropathy and Atrial fibrillation not on blood thinners that presents to the clinic with a right foot wound that has waxed and waned in healing over the past year. He has peripheral neuropathy and is not sure when the wound started or how it started. He had an MRI done by his primary care physician on 08/03/2021 that showed potential early acute osteomyelitis at the posterior calcaneus. He was seen by Dr. Linus Salmons on 08/06/2021 and started on doxycycline and cefadroxil due to these findings. He was evaluated by podiatry, Dr. Jacqualyn Posey and had a bone biopsy done on 08/13/2021 that did not show evidence of osteomyelitis. He had a culture that showed MRSA sensitive to doxycycline. He also had ABIs with TBI's done on 8/2 that showed an ABI of 0.58 and a noncompressible TBI. He is scheduled to see vein and vascular on 8/30. Currently he is keeping the wound covered. He is using a soft  surgical shoe. He denies signs of infection. 8/5; patient presents for follow-up. He had follow-up with Dr. Linus Salmons On 8/23. Plan is for 6 weeks of doxycycline through September 16. Although path was negative for osteo there is still concern due to positive culture from the bone. Unfortunately patient missed his vein and vascular appointment to assess blood flow status. This has been rescheduled for 9/8. Patient has been using Medihoney to the wound bed. He tries to offload the wound bed but has trouble at night keeping pressure off of it. He does not have Prevalon boots. He currently denies signs of infection. 9/19; patient presents for follow-up. He is scheduled to have a bilateral lower extremity angiogram on 9/20. He currently denies signs of infection. He has been using Medihoney to the wound bed. 11/13; patient has missed his last follow-up. In fact he has not been here in 2 months. He had bilateral iliofemoral endarterectomy with retrograde iliac stenting on 10/09/2021. He has been using Medihoney with Hydrofera Blue to the heel wound. He reports improvement in wound healing here. 2 weeks ago he developed a wound to the dorsal aspect of the great toe. He states His shoes cause this issue and he is currently wearing different shoes. He is on clindamycin as there was concern for infection to the right great toe. He currently denies signs of infection. He has been keeping the great toe open to air. 12/4; patient presents for follow-up. He has been using Medihoney and Hydrofera Blue to the heel wound and right great toe wound. The heel wound is closed. The right great toe wound has bone exposed. He now has a new wound to the right ankle that he states he used to shoehorn to scratch the area and created the wound. He has not been dressing this area. 12/21; patient presents for follow-up. He has  been using Hydrofera Blue and Medihoney to the right anterior leg wound and right toe wound. There is  been improvement in wound healing. He has been taking doxycycline prescribed at last clinic visit. He has no issues or complaints today. 1/5; patient presents for follow-up. He has been using Hydrofera Blue and Medihoney to the right anterior leg wound and right toe wound. Unfortunately has developed a new wound to the left lower extremity. He is not sure how this started. He has completed his course of doxycycline. He denies signs of infection. He is scheduled to see vein and vascular for follow-up on 1/12. 1/18; patient presents for follow-up. He saw vein and vascular on 1/12 for evaluation of a new wound to his left lower extremity. They recommended he continue to elevate his legs. They stated that if the wound became worse or did not improve he will need repeat angiogram. Currently patient denies signs of infection. He has been using Medihoney and Hydrofera Blue to the wound beds. Patient History Information obtained from Patient, Chart. Family History Diabetes - Child, Stroke - Mother, No family history of Cancer, Heart Disease, Hereditary Spherocytosis, Hypertension, Kidney Disease, Lung Disease, Seizures, Thyroid Problems, Tuberculosis. Social History Former smoker, Marital Status - Divorced, Alcohol Use - Moderate, Drug Use - Prior History, Caffeine Use - Daily. Medical History Eyes Patient has history of Cataracts - Surgery Cardiovascular Patient has history of Angina - AFib Endocrine Patient has history of Type II Diabetes Musculoskeletal Patient has history of Gout, Osteoarthritis Denies history of Rheumatoid Arthritis, Osteomyelitis Neurologic Patient has history of Neuropathy Hospitalization/Surgery History - inpatient 09/2021 arterial stenting on legs.. Medical A Surgical History Notes nd Cardiovascular Hx CVA Objective [name], [name] ([id number]) [id number] Page 6 of 9 Constitutional respirations regular, non-labored and within target  range for patient.. Vitals Time Taken: 2:07 PM, Height: 68 in, Weight: 198 lbs, BMI: 30.1, Temperature: 97.9 F, Pulse: 79 bpm, Respiratory Rate: 19 breaths/min, Blood Pressure: 135/73 mmHg. Cardiovascular 2+ dorsalis pedis/posterior tibialis pulses. Psychiatric pleasant and cooperative. General Notes: T the dorsal aspect of the right great toe there is an open wound with granulation tissue. No bone exposed. T the right anterior ankle crease o o there is an open wound with dried non viable tissue/drainage and granulation tissue. No signs of infection. Wound to the left anterior leg with nonviable tissue throughout. Integumentary (Hair, Skin) Wound #2 status is Open. Original cause of wound was Trauma. The date acquired was: 11/10/2021. The wound has been in treatment 9 weeks. The wound is located on the Right T Great. The wound measures 1cm length x 1cm width x 0.1cm depth; 0.785cm^2 area and 0.079cm^3 volume. There is bone and Fat oe Layer (Subcutaneous Tissue) exposed. There is no tunneling or undermining noted. There is a medium amount of serosanguineous drainage noted. The wound margin is distinct with the outline attached to the wound base. There is no granulation within the wound bed. There is a large (67-100%) amount of necrotic tissue within the wound bed including Eschar. The periwound skin appearance did not exhibit: Callus, Crepitus, Excoriation, Induration, Rash, Scarring, Dry/Scaly, Maceration, Atrophie Blanche, Cyanosis, Ecchymosis, Hemosiderin Staining, Mottled, Pallor, Rubor, Erythema. Wound #3 status is Open. Original cause of wound was Shear/Friction. The date acquired was: 12/04/2021. The wound has been in treatment 6 weeks. The wound is located on the Right,Anterior Ankle. The wound measures 1cm length x 0.5cm width x 0.2cm depth; 0.393cm^2 area and 0.079cm^3 volume. There is no tunneling or undermining noted. There is  a none present amount of drainage noted. The wound margin  is distinct with the outline attached to the wound base. There is a large (67-100%) amount of necrotic tissue within the wound bed including Adherent Slough. The periwound skin appearance did not exhibit: Callus, Crepitus, Excoriation, Induration, Rash, Scarring, Dry/Scaly, Maceration, Atrophie Blanche, Cyanosis, Ecchymosis, Hemosiderin Staining, Mottled, Pallor, Rubor, Erythema. Wound #4 status is Open. Original cause of wound was Gradually Appeared. The date acquired was: 01/02/2022. The wound has been in treatment 1 weeks. The wound is located on the Left,Anterior Ankle. The wound measures 5cm length x 1.8cm width x 0.1cm depth; 7.069cm^2 area and 0.707cm^3 volume. There is no tunneling or undermining noted. There is a medium amount of serosanguineous drainage noted. The wound margin is distinct with the outline attached to the wound base. There is no granulation within the wound bed. There is a large (67-100%) amount of necrotic tissue within the wound bed including Eschar. The periwound skin appearance exhibited: Maceration. The periwound skin appearance did not exhibit: Callus, Crepitus, Excoriation, Induration, Rash, Scarring, Dry/Scaly, Atrophie Blanche, Cyanosis, Ecchymosis, Hemosiderin Staining, Mottled, Pallor, Rubor, Erythema. Assessment Active Problems ICD-10 Type 2 diabetes mellitus with foot ulcer Peripheral vascular disease, unspecified Other acute osteomyelitis, right ankle and foot Type 2 diabetes mellitus with diabetic neuropathy, unspecified Non-pressure chronic ulcer of other part of right foot with necrosis of bone Non-pressure chronic ulcer of unspecified part of right lower leg with other specified severity Unspecified open wound, left lower leg, initial encounter Patient's right lower extremity wounds appear well-healing. I recommended continue Medihoney and Hydrofera Blue here. The left anterior ankle wound has become slightly larger however there is more granulation  tissue present. I will see him back in 2 weeks. If there is no improvement to the left leg wound he will need to go back to [facility] For possible left arteriogram. Plan Follow-up Appointments: Return Appointment in 2 weeks. - Dr. Heber [place] Other: - F/U with Vein and Vascular if wounds get worse. Anesthetic: (In clinic) Topical Lidocaine 5% applied to wound bed (In clinic) Topical Lidocaine 4% applied to wound bed Bathing/ Shower/ Hygiene: May shower and wash wound with soap and water. Edema Control - Lymphedema / SCD / Other: Elevate legs to the level of the heart or above for 30 minutes daily and/or when sitting for 3-4 times a day throughout the day. Avoid standing for long periods of time. Moisturize legs daily. Off-Loading: Open toe surgical shoe to: - Wear socks with your shoes Other: - -Keep pressure off area as much as is possible wear while resting in bed or chair- Prevalon Boot do not walk or stand with it. Additional Orders / Instructions: Follow Nutritious Diet WOUND #2: - T Great Wound Laterality: Right oe Cleanser: Soap and Water 1 x Per Day/7 Days Discharge Instructions: May shower and wash wound with dial antibacterial soap and water prior to dressing change. [name], [name] ([id number]) [id number] Page 7 of 9 Peri-Wound Care: Skin Prep (Generic) 1 x Per Day/7 Days Discharge Instructions: Use skin prep as directed Prim Dressing: MediHoney Gel, tube 1.5 (oz) 1 x Per Day/7 Days ary Discharge Instructions: Apply to wound bed as instructed Prim Dressing: Hydrofera Blue Ready Foam, 2.5 x2.5 in (Generic) 1 x Per Day/7 Days ary Discharge Instructions: Apply to wound bed as instructed Secondary Dressing: Woven Gauze Sponges 2x2 in 1 x Per Day/7 Days Discharge Instructions: Apply over primary dressing as directed. Secured With: 14M Medipore H Soft Cloth Surgical T ape, 4 x  10 (in/yd) 1 x Per Day/7 Days Discharge Instructions: Secure with tape as  directed. Add-Ons: Cotton Tip Swabs (Generic) 1 x Per Day/7 Days WOUND #3: - Ankle Wound Laterality: Right, Anterior Cleanser: Soap and Water 1 x Per Day/7 Days Discharge Instructions: May shower and wash wound with dial antibacterial soap and water prior to dressing change. Peri-Wound Care: Skin Prep (Generic) 1 x Per Day/7 Days Discharge Instructions: Use skin prep as directed Prim Dressing: MediHoney Gel, tube 1.5 (oz) 1 x Per Day/7 Days ary Discharge Instructions: Apply to wound bed as instructed Prim Dressing: Hydrofera Blue Ready Foam, 2.5 x2.5 in (Generic) 1 x Per Day/7 Days ary Discharge Instructions: Apply to wound bed as instructed Secondary Dressing: Bordered Gauze, 2x2 in 1 x Per Day/7 Days Discharge Instructions: Apply over primary dressing as directed. Add-Ons: Cotton Tip Swabs (Generic) 1 x Per Day/7 Days WOUND #4: - Ankle Wound Laterality: Left, Anterior Cleanser: Soap and Water 1 x Per Day/7 Days Discharge Instructions: May shower and wash wound with dial antibacterial soap and water prior to dressing change. Peri-Wound Care: Skin Prep (Generic) 1 x Per Day/7 Days Discharge Instructions: Use skin prep as directed Prim Dressing: MediHoney Gel, tube 1.5 (oz) 1 x Per Day/7 Days ary Discharge Instructions: Apply to wound bed as instructed Prim Dressing: Hydrofera Blue Ready Foam, 2.5 x2.5 in (Generic) 1 x Per Day/7 Days ary Discharge Instructions: Apply to wound bed as instructed Secondary Dressing: Bordered Gauze, 2x2 in 1 x Per Day/7 Days Discharge Instructions: Apply over primary dressing as directed. Add-Ons: Cotton Tip Swabs (Generic) 1 x Per Day/7 Days 1. Medihoney and Hydrofera Blue 2. Follow-up in 2 weeks Electronic Signature(s) Signed: 01/22/2022 3:08:43 PM By: Kalman Shan DO Entered By: Kalman Shan on 01/22/2022 15:02:18 -------------------------------------------------------------------------------- HxROS Details Patient Name: Date of  Service: Kenneth Mons MES L. 01/22/2022 2:00 PM Medical Record Number: [id number] Patient Account Number: [id number] Date of Birth/Sex: Treating RN: August 22, 1935 (87 y.o. M) Primary Care Provider: Dorthy Cooler, [place] Other Clinician: Referring Provider: Treating Provider/Extender: Alanda Slim, [place] Weeks in Treatment: 21 Information Obtained From Patient Chart Eyes Medical History: Positive for: Cataracts - Surgery Cardiovascular Medical History: Positive for: Angina - AFib Past Medical History Notes: Hx CVA Endocrine Medical History: Positive for: Type II Diabetes ELENO, WEIMAR ([id number]) [id number] Page 8 of 9 Treated with: Oral agents, Diet Blood sugar tested every day: No Musculoskeletal Medical History: Positive for: Gout; Osteoarthritis Negative for: Rheumatoid Arthritis; Osteomyelitis Neurologic Medical History: Positive for: Neuropathy HBO Extended History Items Eyes: Cataracts Immunizations Pneumococcal Vaccine: Received Pneumococcal Vaccination: No Implantable Devices None Hospitalization / Surgery History Type of Hospitalization/Surgery inpatient 09/2021 arterial stenting on legs. Family and Social History Cancer: No; Diabetes: Yes - Child; Heart Disease: No; Hereditary Spherocytosis: No; Hypertension: No; Kidney Disease: No; Lung Disease: No; Seizures: No; Stroke: Yes - Mother; Thyroid Problems: No; Tuberculosis: No; Former smoker; Marital Status - Divorced; Alcohol Use: Moderate; Drug Use: Prior History; Caffeine Use: Daily; Financial Concerns: No; Food, Clothing or Shelter Needs: No; Support System Lacking: No; Transportation Concerns: No Electronic Signature(s) Signed: 01/22/2022 3:08:43 PM By: Kalman Shan DO Entered By: Kalman Shan on 01/22/2022 14:59:07 -------------------------------------------------------------------------------- SuperBill Details Patient Name: Date of Service: Kenneth Mons MES L.  01/22/2022 Medical Record Number: [id number] Patient Account Number: [id number] Date of Birth/Sex: Treating RN: Feb 25, 1935 (87 y.o. [name] Primary Care Provider: Dorthy Cooler, [place] Other Clinician: Referring Provider: Treating Provider/Extender: Alanda Slim, [place] Weeks in Treatment: 21 Diagnosis Coding ICD-10 Codes Code Description E11.621 Type  2 diabetes mellitus with foot ulcer I73.9 Peripheral vascular disease, unspecified M86.171 Other acute osteomyelitis, right ankle and foot E11.40 Type 2 diabetes mellitus with diabetic neuropathy, unspecified L97.514 Non-pressure chronic ulcer of other part of right foot with necrosis of bone L97.918 Non-pressure chronic ulcer of unspecified part of right lower leg with other specified severity S81.802A Unspecified open wound, left lower leg, initial encounter Facility Procedures : JAESEAN, [facility] Code: [id number] MES L ([id number]) Description: 2585 - WOUND CARE VISIT-LEV 5 EST PT [phone] Modifier: [id number] Quantity: 1 .pdf Page 9 of 9 Physician Procedures : CPT4 Code Description Modifier [id number] - WC PHYS LEVEL 3 - EST PT ICD-10 Diagnosis Description E11.621 Type 2 diabetes mellitus with foot ulcer L97.918 Non-pressure chronic ulcer of unspecified part of right lower leg with other specified  severity S81.802A Unspecified open wound, left lower leg, initial encounter I73.9 Peripheral vascular disease, unspecified Quantity: 1 Electronic Signature(s) Signed: 01/22/2022 3:08:43 PM By: Kalman Shan DO Entered By: Kalman Shan on 01/22/2022 15:02:51

## 2022-01-28 NOTE — Progress Notes (Signed)
SHIGERU, LAMPERT ([id number]) [id number] Page 1 of 13 Visit Report for 01/22/2022 Arrival Information Details Patient Name: Date of Service: INDIE, NICKERSON MES L. 01/22/2022 2:00 PM Medical Record Number: [id number] Patient Account Number: [id number] Date of Birth/Sex: Treating RN: December 28, 1935 (87 y.o. [name] Primary Care Haadi Santellan: Dorthy Cooler, Dibas Other Clinician: Referring Yichen Gilardi: Treating Lavell Supple/Extender: Alanda Slim, Dibas Weeks in Treatment: 21 Visit Information History Since Last Visit Added or deleted any medications: No Patient Arrived: Walker Any new allergies or adverse reactions: No Arrival Time: 14:06 Had a fall or experienced change in No Accompanied By: son activities of daily living that may affect Transfer Assistance: None risk of falls: Patient Identification Verified: Yes Signs or symptoms of abuse/neglect since last visito No Secondary Verification Process Completed: Yes Hospitalized since last visit: No Patient Requires Transmission-Based Precautions: No Implantable device outside of the clinic excluding No Patient Has Alerts: Yes cellular tissue based products placed in the center Patient Alerts: Patient on Blood Thinner since last visit: ABI R=.58 TBI R=Natural Steps Has Dressing in Place as Prescribed: Yes Pain Present Now: Yes Electronic Signature(s) Signed: 01/23/2022 11:49:43 AM By: Erenest Blank Entered By: Erenest Blank on 01/22/2022 14:34:56 -------------------------------------------------------------------------------- Clinic Level of Care Assessment Details Patient Name: Date of Service: ASSER, LUCENA MES L. 01/22/2022 2:00 PM Medical Record Number: [id number] Patient Account Number: [id number] Date of Birth/Sex: Treating RN: [birth year] (87 y.o. [name] Primary Care Rishab Stoudt: Dorthy Cooler, Dibas Other Clinician: Referring Galia Rahm: Treating Dorreen Valiente/Extender: Alanda Slim, Dibas Weeks in  Treatment: 21 Clinic Level of Care Assessment Items TOOL 4 Quantity Score X- 1 0 Use when only an EandM is performed on FOLLOW-UP visit ASSESSMENTS - Nursing Assessment / Reassessment X- 1 10 Reassessment of Co-morbidities (includes updates in patient status) X- 1 5 Reassessment of Adherence to Treatment Plan ASSESSMENTS - Wound and Skin A ssessment / Reassessment '[]'$  - 0 Simple Wound Assessment / Reassessment - one wound X- 3 5 Complex Wound Assessment / Reassessment - multiple wounds '[]'$  - 0 Dermatologic / Skin Assessment (not related to wound area) ASSESSMENTS - Focused Assessment X- 2 5 Circumferential Edema Measurements - multi extremities '[]'$  - 0 Nutritional Assessment / Counseling / Intervention RC, AMISON ([id number]) [id number] Page 2 of 13 '[]'$  - 0 Lower Extremity Assessment (monofilament, tuning fork, pulses) '[]'$  - 0 Peripheral Arterial Disease Assessment (using hand held doppler) ASSESSMENTS - Ostomy and/or Continence Assessment and Care '[]'$  - 0 Incontinence Assessment and Management '[]'$  - 0 Ostomy Care Assessment and Management (repouching, etc.) PROCESS - Coordination of Care '[]'$  - 0 Simple Patient / Family Education for ongoing care X- 1 20 Complex (extensive) Patient / Family Education for ongoing care X- 1 10 Staff obtains Programmer, systems, Records, T Results / Process Orders est '[]'$  - 0 Staff telephones HHA, Nursing Homes / Clarify orders / etc '[]'$  - 0 Routine Transfer to another Facility (non-emergent condition) '[]'$  - 0 Routine Hospital Admission (non-emergent condition) '[]'$  - 0 New Admissions / Biomedical engineer / Ordering NPWT Apligraf, etc. , '[]'$  - 0 Emergency Hospital Admission (emergent condition) '[]'$  - 0 Simple Discharge Coordination X- 1 15 Complex (extensive) Discharge Coordination PROCESS - Special Needs '[]'$  - 0 Pediatric / Minor Patient Management '[]'$  - 0 Isolation Patient Management '[]'$  - 0 Hearing / Language /  Visual special needs '[]'$  - 0 Assessment of Community assistance (transportation, D/C planning, etc.) '[]'$  - 0 Additional assistance / Altered mentation '[]'$  - 0 Support Surface(s) Assessment (bed, cushion, seat, etc.) INTERVENTIONS - Wound Cleansing /  Measurement '[]'$  - 0 Simple Wound Cleansing - one wound X- 3 5 Complex Wound Cleansing - multiple wounds X- 1 5 Wound Imaging (photographs - any number of wounds) '[]'$  - 0 Wound Tracing (instead of photographs) '[]'$  - 0 Simple Wound Measurement - one wound X- 3 5 Complex Wound Measurement - multiple wounds INTERVENTIONS - Wound Dressings '[]'$  - 0 Small Wound Dressing one or multiple wounds X- 3 15 Medium Wound Dressing one or multiple wounds '[]'$  - 0 Large Wound Dressing one or multiple wounds X- 1 5 Application of Medications - topical '[]'$  - 0 Application of Medications - injection INTERVENTIONS - Miscellaneous '[]'$  - 0 External ear exam '[]'$  - 0 Specimen Collection (cultures, biopsies, blood, body fluids, etc.) '[]'$  - 0 Specimen(s) / Culture(s) sent or taken to Lab for analysis '[]'$  - 0 Patient Transfer (multiple staff / Civil Service fast streamer / Similar devices) '[]'$  - 0 Simple Staple / Suture removal (25 or less) '[]'$  - 0 Complex Staple / Suture removal (26 or more) '[]'$  - 0 Hypo / Hyperglycemic Management (close monitor of Blood Glucose) OLUWATIMILEHIN, BALFOUR L ([id number]) [id number] Page 3 of 13 '[]'$  - 0 Ankle / Brachial Index (ABI) - do not check if billed separately X- 1 5 Vital Signs Has the patient been seen at the hospital within the last three years: Yes Total Score: 175 Level Of Care: New/Established - Level 5 Electronic Signature(s) Signed: 01/28/2022 4:09:44 PM By: Rhae Hammock RN Entered By: Rhae Hammock on 01/22/2022 14:34:23 -------------------------------------------------------------------------------- Encounter Discharge Information Details Patient Name: Date of Service: [name] 01/22/2022 2:00  PM Medical Record Number: [id number] Patient Account Number: [id number] Date of Birth/Sex: Treating RN: [birth year] (86 y.o. [name] Primary Care Yorley Buch: Dorthy Cooler, Dibas Other Clinician: Referring Hildegard Hlavac: Treating Devesh Monforte/Extender: Alanda Slim, Dibas Weeks in Treatment: 21 Encounter Discharge Information Items Discharge Condition: Stable Ambulatory Status: Ambulatory Discharge Destination: Home Transportation: Private Auto Accompanied By: son Schedule Follow-up Appointment: Yes Clinical Summary of Care: Patient Declined Electronic Signature(s) Signed: 01/28/2022 4:09:44 PM By: Rhae Hammock RN Entered By: Rhae Hammock on 01/22/2022 14:35:06 -------------------------------------------------------------------------------- Lower Extremity Assessment Details Patient Name: Date of Service: [name] 01/22/2022 2:00 PM Medical Record Number: [id number] Patient Account Number: [id number] Date of Birth/Sex: Treating RN: Apr 05, 1935 (87 y.o. [name] Primary Care Charlee Whitebread: Dorthy Cooler, Dibas Other Clinician: Referring Kambre Messner: Treating Sirenity Shew/Extender: Alanda Slim, Dibas Weeks in Treatment: 21 Edema Assessment Assessed: [Left: No] [Right: No] Edema: [Left: Ye] [Right: s] Calf Left: Right: Point of Measurement: 42 cm From Medial Instep 38.5 cm 39 cm Ankle Left: Right: Point of Measurement: 8 cm From Medial Instep 24 cm 23.5 cm Electronic Signature(s) Signed: 01/23/2022 11:49:43 AM By: Mellody Memos ([id number]) [id number] Page 4 of 13 Signed: 01/28/2022 4:09:44 PM By: Rhae Hammock RN Entered By: Erenest Blank on 01/22/2022 14:35:13 -------------------------------------------------------------------------------- Multi Wound Chart Details Patient Name: Date of Service: [name] 01/22/2022 2:00 PM Medical Record Number: [id number] Patient Account Number:  [id number] Date of Birth/Sex: Treating RN: 06-09-1935 (87 y.o. M) Primary Care Leonte Horrigan: Dorthy Cooler, Dibas Other Clinician: Referring Kambrey Hagger: Treating Jamear Carbonneau/Extender: Alanda Slim, Dibas Weeks in Treatment: 21 Vital Signs Height(in): 68 Pulse(bpm): 79 Weight(lbs): 198 Blood Pressure(mmHg): 135/73 Body Mass Index(BMI): 30.1 Temperature(F): 97.9 Respiratory Rate(breaths/min): 19 [2:Photos:] Right T Great oe Right, Anterior Ankle Left, Anterior Ankle Wound Location: Trauma Shear/Friction Gradually Appeared Wounding Event: Diabetic Wound/Ulcer of the Lower Diabetic Wound/Ulcer of the Lower Diabetic Wound/Ulcer of the Lower Primary Etiology: Extremity Extremity  Extremity Abrasion Pressure Ulcer N/A Secondary Etiology: Cataracts, Angina, Type II Diabetes, Cataracts, Angina, Type II Diabetes, Cataracts, Angina, Type II Diabetes, Comorbid History: Gout, Osteoarthritis, Neuropathy Gout, Osteoarthritis, Neuropathy Gout, Osteoarthritis, Neuropathy 11/10/2021 12/04/2021 01/02/2022 Date Acquired: '9 6 1 '$ Weeks of Treatment: Open Open Open Wound Status: No No No Wound Recurrence: 1x1x0.1 1x0.5x0.2 5x1.8x0.1 Measurements L x W x D (cm) 0.785 0.393 7.069 A (cm) : rea 0.079 0.079 0.707 Volume (cm) : 0.00% 63.20% -226.10% % Reduction in A rea: 0.00% 26.20% -62.90% % Reduction in Volume: Grade 2 Grade 2 Unable to visualize wound bed Classification: Medium None Present Medium Exudate A mount: Serosanguineous N/A Serosanguineous Exudate Type: red, brown N/A red, brown Exudate Color: Distinct, outline attached Distinct, outline attached Distinct, outline attached Wound Margin: None Present (0%) N/A None Present (0%) Granulation A mount: Large (67-100%) N/A Large (67-100%) Necrotic A mount: Eschar Adherent Slough Eschar Necrotic Tissue: Fat Layer (Subcutaneous Tissue): Yes Fascia: No Fascia: No Exposed Structures: Bone: Yes Fat Layer (Subcutaneous Tissue):  No Fat Layer (Subcutaneous Tissue): No Fascia: No Tendon: No Tendon: No Tendon: No Muscle: No Muscle: No Muscle: No Joint: No Joint: No Joint: No Bone: No Bone: No None None None Epithelialization: Excoriation: No Excoriation: No Excoriation: No Periwound Skin Texture: Induration: No Induration: No Induration: No Callus: No Callus: No Callus: No Crepitus: No Crepitus: No Crepitus: No Rash: No Rash: No Rash: No Scarring: No Scarring: No Scarring: No Maceration: No Maceration: No Maceration: Yes Periwound Skin Moisture: Dry/Scaly: No Dry/Scaly: No Dry/Scaly: No Atrophie Blanche: No Atrophie Blanche: No Atrophie Blanche: No Periwound Skin Color: Cyanosis: No Cyanosis: No Cyanosis: No Ecchymosis: No Ecchymosis: No Ecchymosis: No [place], [place] ([id number]) [id number] Page 5 of 13 Erythema: No Erythema: No Erythema: No Hemosiderin Staining: No Hemosiderin Staining: No Hemosiderin Staining: No Mottled: No Mottled: No Mottled: No Pallor: No Pallor: No Pallor: No Rubor: No Rubor: No Rubor: No Treatment Notes Wound #2 (Toe Great) Wound Laterality: Right Cleanser Soap and Water Discharge Instruction: May shower and wash wound with dial antibacterial soap and water prior to dressing change. Peri-Wound Care Skin Prep Discharge Instruction: Use skin prep as directed Topical Primary Dressing MediHoney Gel, tube 1.5 (oz) Discharge Instruction: Apply to wound bed as instructed Hydrofera Blue Ready Foam, 2.5 x2.5 in Discharge Instruction: Apply to wound bed as instructed Secondary Dressing Woven Gauze Sponges 2x2 in Discharge Instruction: Apply over primary dressing as directed. Secured With 19M Medipore H Soft Cloth Surgical T ape, 4 x 10 (in/yd) Discharge Instruction: Secure with tape as directed. Compression Wrap Compression Stockings Add-Ons Cotton Tip Swabs Wound #3 (Ankle) Wound Laterality: Right,  Anterior Cleanser Soap and Water Discharge Instruction: May shower and wash wound with dial antibacterial soap and water prior to dressing change. Peri-Wound Care Skin Prep Discharge Instruction: Use skin prep as directed Topical Primary Dressing MediHoney Gel, tube 1.5 (oz) Discharge Instruction: Apply to wound bed as instructed Hydrofera Blue Ready Foam, 2.5 x2.5 in Discharge Instruction: Apply to wound bed as instructed Secondary Dressing Bordered Gauze, 2x2 in Discharge Instruction: Apply over primary dressing as directed. Secured With Compression Wrap Compression Stockings Add-Ons Cotton Tip Swabs Wound #4 (Ankle) Wound Laterality: Left, Anterior Cleanser Soap and Water Discharge Instruction: May shower and wash wound with dial antibacterial soap and water prior to dressing change. SUREN, PAYNE ([id number]) [id number] Page 6 of 13 Peri-Wound Care Skin Prep Discharge Instruction: Use skin prep as directed Topical Primary Dressing MediHoney Gel, tube 1.5 (oz) Discharge Instruction: Apply to wound  bed as instructed Hydrofera Blue Ready Foam, 2.5 x2.5 in Discharge Instruction: Apply to wound bed as instructed Secondary Dressing Bordered Gauze, 2x2 in Discharge Instruction: Apply over primary dressing as directed. Secured With Compression Wrap Compression Stockings Add-Ons Cotton Tip Swabs Electronic Signature(s) Signed: 01/22/2022 3:08:43 PM By: Kalman Shan DO Entered By: Kalman Shan on 01/22/2022 14:54:58 -------------------------------------------------------------------------------- Multi-Disciplinary Care Plan Details Patient Name: Date of Service: [name] 01/22/2022 2:00 PM Medical Record Number: [id number] Patient Account Number: [id number] Date of Birth/Sex: Treating RN: [birth year] (87 y.o. [name] Primary Care Toney Lizaola: Dorthy Cooler, Dibas Other Clinician: Referring Staphanie Harbison: Treating Curtez Brallier/Extender:  Alanda Slim, Dibas Weeks in Treatment: 21 Active Inactive Pressure Nursing Diagnoses: Knowledge deficit related to management of pressures ulcers Goals: Patient/caregiver will verbalize understanding of pressure ulcer management Date Initiated: 08/26/2021 Target Resolution Date: 03/06/2022 Goal Status: Active Interventions: Assess: immobility, friction, shearing, incontinence upon admission and as needed Assess offloading mechanisms upon admission and as needed Provide education on pressure ulcers Notes: Wound/Skin Impairment Nursing Diagnoses: Impaired tissue integrity Goals: Patient/caregiver will verbalize understanding of skin care regimen GRIFFEN, FRAYNE ([id number]) [id number] Page 7 of 13 Date Initiated: 08/26/2021 Target Resolution Date: 05/01/2022 Goal Status: Active Ulcer/skin breakdown will have a volume reduction of 30% by week 4 Date Initiated: 08/26/2021 Date Inactivated: 01/09/2022 Target Resolution Date: 01/03/2022 Unmet Reason: see wound Goal Status: Unmet measurement. Interventions: Assess patient/caregiver ability to obtain necessary supplies Assess patient/caregiver ability to perform ulcer/skin care regimen upon admission and as needed Assess ulceration(s) every visit Provide education on ulcer and skin care Treatment Activities: Topical wound management initiated : 08/26/2021 Notes: Electronic Signature(s) Signed: 01/28/2022 4:09:44 PM By: Rhae Hammock RN Entered By: Rhae Hammock on 01/22/2022 14:33:28 -------------------------------------------------------------------------------- Pain Assessment Details Patient Name: Date of Service: [name] 01/22/2022 2:00 PM Medical Record Number: [id number] Patient Account Number: [id number] Date of Birth/Sex: Treating RN: [birth year] (88 y.o. [name] Primary Care Skylan Lara: Dorthy Cooler, Dibas Other Clinician: Referring Kimika Streater: Treating  Peniel Biel/Extender: Alanda Slim, Dibas Weeks in Treatment: 21 Active Problems Location of Pain Severity and Description of Pain Patient Has Paino Yes Site Locations Pain Location: Pain in Ulcers Rate the pain. Current Pain Level: 3 Pain Management and Medication Current Pain Management: Electronic Signature(s) Signed: 01/23/2022 11:49:43 AM By: Erenest Blank Signed: 01/28/2022 4:09:44 PM By: Rhae Hammock RN Entered By: Erenest Blank on 01/22/2022 14:35:07 [name] ([id number]) [id number] Page 8 of 13 -------------------------------------------------------------------------------- Patient/Caregiver Education Details Patient Name: Date of Service: GIOVANY, COSBY MES L. 1/18/2024andnbsp2:00 PM Medical Record Number: [id number] Patient Account Number: [id number] Date of Birth/Gender: Treating RN: 1935-07-18 (87 y.o. [name] Primary Care Physician: Dorthy Cooler, Dibas Other Clinician: Referring Physician: Treating Physician/Extender: Alanda Slim, Dibas Weeks in Treatment: 21 Education Assessment Education Provided To: Patient Education Topics Provided Wound/Skin Impairment: Methods: Explain/Verbal Responses: Reinforcements needed, State content correctly Electronic Signature(s) Signed: 01/28/2022 4:09:44 PM By: Rhae Hammock RN Entered By: Rhae Hammock on 01/22/2022 14:33:42 -------------------------------------------------------------------------------- Wound Assessment Details Patient Name: Date of Service: [name] 01/22/2022 2:00 PM Medical Record Number: [id number] Patient Account Number: [id number] Date of Birth/Sex: Treating RN: 19-Jun-1935 (87 y.o. [name] Primary Care Keah Lamba: Dorthy Cooler, Dibas Other Clinician: Referring Raedyn Wenke: Treating Lamanda Rudder/Extender: Alanda Slim, Dibas Weeks in Treatment: 21 Wound Status Wound Number: 2 Primary Etiology: Diabetic  Wound/Ulcer of the Lower Extremity Wound Location: Right T Great oe Secondary Abrasion Etiology: Wounding Event: Trauma Wound Status: Open Date Acquired: 11/10/2021 Comorbid Cataracts, Angina, Type II Diabetes, Gout,  Osteoarthritis, Weeks Of Treatment: 9 History: Neuropathy Clustered Wound: No Photos Wound Measurements Length: (cm) 1 Width: (cm) 1 Mcginniss, Avian L ([id number]) Depth: (cm) Area: (cm) Volume: (cm) % Reduction in Area: 0% % Reduction in Volume: 0% [id number] Page 9 of 13 0.1 Epithelialization: None 0.785 Tunneling: No 0.079 Undermining: No Wound Description Classification: Grade 2 Wound Margin: Distinct, outline attached Exudate Amount: Medium Exudate Type: Serosanguineous Exudate Color: red, brown Foul Odor After Cleansing: No Slough/Fibrino Yes Wound Bed Granulation Amount: None Present (0%) Exposed Structure Necrotic Amount: Large (67-100%) Fascia Exposed: No Necrotic Quality: Eschar Fat Layer (Subcutaneous Tissue) Exposed: Yes Tendon Exposed: No Muscle Exposed: No Joint Exposed: No Bone Exposed: Yes Periwound Skin Texture Texture Color No Abnormalities Noted: No No Abnormalities Noted: No Callus: No Atrophie Blanche: No Crepitus: No Cyanosis: No Excoriation: No Ecchymosis: No Induration: No Erythema: No Rash: No Hemosiderin Staining: No Scarring: No Mottled: No Pallor: No Moisture Rubor: No No Abnormalities Noted: No Dry / Scaly: No Maceration: No Treatment Notes Wound #2 (Toe Great) Wound Laterality: Right Cleanser Soap and Water Discharge Instruction: May shower and wash wound with dial antibacterial soap and water prior to dressing change. Peri-Wound Care Skin Prep Discharge Instruction: Use skin prep as directed Topical Primary Dressing MediHoney Gel, tube 1.5 (oz) Discharge Instruction: Apply to wound bed as instructed Hydrofera Blue Ready Foam, 2.5 x2.5 in Discharge Instruction: Apply to  wound bed as instructed Secondary Dressing Woven Gauze Sponges 2x2 in Discharge Instruction: Apply over primary dressing as directed. Secured With 43M Medipore H Soft Cloth Surgical T ape, 4 x 10 (in/yd) Discharge Instruction: Secure with tape as directed. Compression Wrap Compression Stockings Add-Ons Cotton Tip Swabs Electronic Signature(s) Signed: 01/23/2022 11:49:43 AM By: Erenest Blank Signed: 01/28/2022 4:09:44 PM By: Rhae Hammock RN Entered By: Erenest Blank on 01/22/2022 14:24:02 [name] ([id number]) [id number] Page 10 of 13 -------------------------------------------------------------------------------- Wound Assessment Details Patient Name: Date of Service: RIELLY, CORLETT MES L. 01/22/2022 2:00 PM Medical Record Number: [id number] Patient Account Number: [id number] Date of Birth/Sex: Treating RN: July 04, 1935 (87 y.o. [name], Lauren Primary Care Teddie Mehta: Dorthy Cooler, Dibas Other Clinician: Referring Vitaly Wanat: Treating Tumeka Chimenti/Extender: Alanda Slim, Dibas Weeks in Treatment: 21 Wound Status Wound Number: 3 Primary Etiology: Diabetic Wound/Ulcer of the Lower Extremity Wound Location: Right, Anterior Ankle Secondary Pressure Ulcer Etiology: Wounding Event: Shear/Friction Wound Status: Open Date Acquired: 12/04/2021 Comorbid Cataracts, Angina, Type II Diabetes, Gout, Osteoarthritis, Weeks Of Treatment: 6 History: Neuropathy Clustered Wound: No Photos Wound Measurements Length: (cm) 1 Width: (cm) 0.5 Depth: (cm) 0.2 Area: (cm) 0.393 Volume: (cm) 0.079 % Reduction in Area: 63.2% % Reduction in Volume: 26.2% Epithelialization: None Tunneling: No Undermining: No Wound Description Classification: Grade 2 Wound Margin: Distinct, outline attached Exudate Amount: None Present Foul Odor After Cleansing: No Slough/Fibrino No Wound Bed Necrotic Amount: Large (67-100%) Exposed Structure Necrotic Quality: Adherent  Slough Fascia Exposed: No Fat Layer (Subcutaneous Tissue) Exposed: No Tendon Exposed: No Muscle Exposed: No Joint Exposed: No Bone Exposed: No Periwound Skin Texture Texture Color No Abnormalities Noted: No No Abnormalities Noted: No Callus: No Atrophie Blanche: No Crepitus: No Cyanosis: No Excoriation: No Ecchymosis: No Induration: No Erythema: No Rash: No Hemosiderin Staining: No Scarring: No Mottled: No Pallor: No Moisture Rubor: No No Abnormalities Noted: No Dry / Scaly: No Maceration: No KORI, COLIN ([id number]) [id number] Page 11 of 13 Treatment Notes Wound #3 (Ankle) Wound Laterality: Right, Anterior Cleanser Soap and Water Discharge Instruction: May shower and wash wound with dial antibacterial soap and  water prior to dressing change. Peri-Wound Care Skin Prep Discharge Instruction: Use skin prep as directed Topical Primary Dressing MediHoney Gel, tube 1.5 (oz) Discharge Instruction: Apply to wound bed as instructed Hydrofera Blue Ready Foam, 2.5 x2.5 in Discharge Instruction: Apply to wound bed as instructed Secondary Dressing Bordered Gauze, 2x2 in Discharge Instruction: Apply over primary dressing as directed. Secured With Compression Wrap Compression Stockings Add-Ons Cotton Tip Swabs Electronic Signature(s) Signed: 01/23/2022 11:49:43 AM By: Erenest Blank Signed: 01/28/2022 4:09:44 PM By: Rhae Hammock RN Entered By: Erenest Blank on 01/22/2022 14:24:25 -------------------------------------------------------------------------------- Wound Assessment Details Patient Name: Date of Service: [name] 01/22/2022 2:00 PM Medical Record Number: [id number] Patient Account Number: [id number] Date of Birth/Sex: Treating RN: 09/11/1935 (87 y.o. [name] Primary Care Oddie Bottger: Dorthy Cooler, Dibas Other Clinician: Referring Chetara Kropp: Treating Maalik Pinn/Extender: Alanda Slim, Dibas Weeks in  Treatment: 21 Wound Status Wound Number: 4 Primary Diabetic Wound/Ulcer of the Lower Extremity Etiology: Wound Location: Left, Anterior Ankle Wound Status: Open Wounding Event: Gradually Appeared Comorbid Cataracts, Angina, Type II Diabetes, Gout, Osteoarthritis, Date Acquired: 01/02/2022 History: Neuropathy Weeks Of Treatment: 1 Clustered Wound: No Photos EMETT, STAPEL ([id number]) [id number] Page 12 of 13 Wound Measurements Length: (cm) 5 Width: (cm) 1.8 Depth: (cm) 0.1 Area: (cm) 7.069 Volume: (cm) 0.707 % Reduction in Area: -226.1% % Reduction in Volume: -62.9% Epithelialization: None Tunneling: No Undermining: No Wound Description Classification: Unable to visualize wound bed Wound Margin: Distinct, outline attached Exudate Amount: Medium Exudate Type: Serosanguineous Exudate Color: red, brown Foul Odor After Cleansing: No Slough/Fibrino Yes Wound Bed Granulation Amount: None Present (0%) Exposed Structure Necrotic Amount: Large (67-100%) Fascia Exposed: No Necrotic Quality: Eschar Fat Layer (Subcutaneous Tissue) Exposed: No Tendon Exposed: No Muscle Exposed: No Joint Exposed: No Bone Exposed: No Periwound Skin Texture Texture Color No Abnormalities Noted: No No Abnormalities Noted: No Callus: No Atrophie Blanche: No Crepitus: No Cyanosis: No Excoriation: No Ecchymosis: No Induration: No Erythema: No Rash: No Hemosiderin Staining: No Scarring: No Mottled: No Pallor: No Moisture Rubor: No No Abnormalities Noted: No Dry / Scaly: No Maceration: Yes Treatment Notes Wound #4 (Ankle) Wound Laterality: Left, Anterior Cleanser Soap and Water Discharge Instruction: May shower and wash wound with dial antibacterial soap and water prior to dressing change. Peri-Wound Care Skin Prep Discharge Instruction: Use skin prep as directed Topical Primary Dressing MediHoney Gel, tube 1.5 (oz) Discharge Instruction: Apply to  wound bed as instructed Hydrofera Blue Ready Foam, 2.5 x2.5 in Discharge Instruction: Apply to wound bed as instructed Secondary Dressing Bordered Gauze, 2x2 in Discharge Instruction: Apply over primary dressing as directed. Secured With Compression Wrap Compression Stockings Add-Ons Cotton Tip Swabs Electronic Signature(s) Signed: 01/23/2022 11:49:43 AM By: Erenest Blank Signed: 01/28/2022 4:09:44 PM By: Rhae Hammock RN Entered By: Erenest Blank on 01/22/2022 14:24:49 [name] ([id number]) [id number] Page 13 of 13 -------------------------------------------------------------------------------- Vitals Details Patient Name: Date of Service: ZYMERE, PATLAN MES L. 01/22/2022 2:00 PM Medical Record Number: [id number] Patient Account Number: [id number] Date of Birth/Sex: Treating RN: Mar 24, 1935 (87 y.o. [name] Primary Care Saladin Petrelli: Dorthy Cooler, Dibas Other Clinician: Referring Sydnei Ohaver: Treating Shital Crayton/Extender: Alanda Slim, Dibas Weeks in Treatment: 21 Vital Signs Time Taken: 14:07 Temperature (F): 97.9 Height (in): 68 Pulse (bpm): 79 Weight (lbs): 198 Respiratory Rate (breaths/min): 19 Body Mass Index (BMI): 30.1 Blood Pressure (mmHg): 135/73 Reference Range: 80 - 120 mg / dl Electronic Signature(s) Signed: 01/23/2022 11:49:43 AM By: Erenest Blank Entered By: Erenest Blank on 01/22/2022 14:35:01

## 2022-02-05 ENCOUNTER — Ambulatory Visit (HOSPITAL_BASED_OUTPATIENT_CLINIC_OR_DEPARTMENT_OTHER): Payer: Medicare Other | Admitting: Internal Medicine

## 2022-02-06 ENCOUNTER — Ambulatory Visit (INDEPENDENT_AMBULATORY_CARE_PROVIDER_SITE_OTHER): Payer: Medicare Other | Admitting: Physician Assistant

## 2022-02-06 VITALS — BP 131/79 | HR 78 | Temp 98.0°F | Resp 20 | Ht 68.0 in | Wt 199.0 lb

## 2022-02-06 DIAGNOSIS — I70223 Atherosclerosis of native arteries of extremities with rest pain, bilateral legs: Secondary | ICD-10-CM | POA: Diagnosis not present

## 2022-02-06 DIAGNOSIS — I872 Venous insufficiency (chronic) (peripheral): Secondary | ICD-10-CM | POA: Diagnosis not present

## 2022-02-06 NOTE — Progress Notes (Unsigned)
Office Note   History of Present Illness   [name] is a 87 y.o. (1936-01-01) male who presents for wound check.  He is status post bilateral iliofemoral endarterectomy with bilateral common iliac artery stents on 10/09/2021 by Dr. Virl Cagey. 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  At his last follow-up with our office in January, his right great toe and ankle wounds were stable.  He had also developed a new ulcer on the left distal anterior shin.  He has been under the care of the wound care center for these wounds.  Currently they are dressing the wounds with manuka honey and gauze.  It was thought that his left anterior shin wound was due to venous disease, and had poor healing due to his continued swelling in BLE.  He returns today for a check on his bilateral lower extremity ulcers.  His ulcer is on the right leg and great toe are improving.  His wound on the left anterior distal shin has not improved, however he has been inconsistent with dressing changes to the wound.  He also missed an appointment with the wound care center yesterday.  He reports elevating his legs and his adjustable bed at night.  During the day he will usually be in his recliner with his legs slightly elevated.   Current Outpatient Medications  Medication Sig Dispense Refill   amoxicillin (AMOXIL) 500 MG capsule TAKE 1 CAPSULE BY MOUTH TWICE A DAY 60 capsule 2   APPLE CIDER VINEGAR PO Take 1 capsule by mouth daily.     aspirin EC 81 MG tablet Take 81 mg by mouth every other day. Swallow whole.     atenolol (TENORMIN) 50 MG tablet Take 50 mg by mouth daily.     cefadroxil (DURICEF) 500 MG capsule Take 1 capsule (500 mg total) by mouth 2 (two) times daily. 60 capsule 1   clindamycin (CLEOCIN) 150 MG capsule Take 450 mg by mouth 3 (three) times daily.     clopidogrel (PLAVIX) 75 MG tablet TAKE 1 TABLET (75 MG TOTAL) BY MOUTH DAILY AT 6 (SIX) AM. 90 tablet 1   doxycycline  (VIBRA-TABS) 100 MG tablet Take 1 tablet (100 mg total) by mouth 2 (two) times daily. 60 tablet 1   fluticasone (FLONASE) 50 MCG/ACT nasal spray Place 2 sprays into both nostrils daily.     furosemide (LASIX) 20 MG tablet Take 20 mg by mouth daily.     hydrALAZINE (APRESOLINE) 25 MG tablet Take 1 tablet (25 mg total) by mouth every 8 (eight) hours.     irbesartan (AVAPRO) 75 MG tablet Take 75 mg by mouth daily.     LYRICA 75 MG capsule Take 75-150 mg by mouth See admin instructions. Take 75 mg by mouth in the morning and 150 mg at night     meloxicam (MOBIC) 7.5 MG tablet Take 7.5 mg by mouth daily.     metFORMIN (GLUCOPHAGE) 1000 MG tablet Take 1,000 mg by mouth in the morning and at bedtime.     Multiple Vitamin (MULTIVITAMIN WITH MINERALS) TABS tablet Take 1 tablet by mouth daily.     neomycin-bacitracin-polymyxin (NEOSPORIN) [phone] ointment Apply 1 Application topically daily as needed (wound care).     OVER THE COUNTER MEDICATION Take 1 capsule by mouth daily. Balance of Nature Fruits     OVER THE COUNTER MEDICATION Take 1 tablet by mouth daily. Beet supplement     protein supplement shake (  PREMIER PROTEIN) LIQD Take 11 oz by mouth daily.     rosuvastatin (CRESTOR) 20 MG tablet Take 1 tablet (20 mg total) by mouth daily. 90 tablet 3   Tetrahydrozoline HCl (VISINE OP) Place 1 drop into both eyes daily as needed (burning).     traMADol (ULTRAM) 50 MG tablet Take 2 tablets (100 mg total) by mouth every 6 (six) hours as needed. Every 4 hours 30 tablet 0   ULORIC 80 MG TABS Take 80 mg by mouth daily.  3   No current facility-administered medications for this visit.    REVIEW OF SYSTEMS (negative unless checked):   Cardiac:  '[]'$  Chest pain or chest pressure? '[]'$  Shortness of breath upon activity? '[]'$  Shortness of breath when lying flat? '[]'$  Irregular heart rhythm?  Vascular:  '[]'$  Pain in calf, thigh, or hip brought on by walking? '[]'$  Pain in feet at night that wakes you up from your  sleep? '[]'$  Blood clot in your veins? '[x]'$  Leg swelling?  Pulmonary:  '[]'$  Oxygen at home? '[]'$  Productive cough? '[]'$  Wheezing?  Neurologic:  '[]'$  Sudden weakness in arms or legs? '[]'$  Sudden numbness in arms or legs? '[]'$  Sudden onset of difficult speaking or slurred speech? '[]'$  Temporary loss of vision in one eye? '[]'$  Problems with dizziness?  Gastrointestinal:  '[]'$  Blood in stool? '[]'$  Vomited blood?  Genitourinary:  '[]'$  Burning when urinating? '[]'$  Blood in urine?  Psychiatric:  '[]'$  Major depression  Hematologic:  '[]'$  Bleeding problems? '[]'$  Problems with blood clotting?  Dermatologic:  '[]'$  Rashes or ulcers?  Constitutional:  '[]'$  Fever or chills?  Ear/Nose/Throat:  '[]'$  Change in hearing? '[]'$  Nose bleeds? '[]'$  Sore throat?  Musculoskeletal:  '[]'$  Back pain? '[]'$  Joint pain? '[]'$  Muscle pain?   Physical Examination   Vitals:   02/06/22 1344  BP: 131/79  Pulse: 78  Resp: 20  Temp: 98 F (36.7 C)  TempSrc: Temporal  SpO2: 98%  Weight: 199 lb (90.3 kg)  Height: '5\' 8"'$  (1.727 m)   Body mass index is 30.26 kg/m.  General:  WDWN in NAD; vital signs documented above Gait: Not observed HENT: WNL, normocephalic Pulmonary: normal non-labored breathing  Cardiac: regular rate and rhythm Abdomen: soft, NT, no masses Skin: without rashes Vascular Exam/Pulses: Monophasic right DP/PT doppler signals, faint monophasic left DP/Peroneal doppler signals Extremities: Slightly improved right great toe and ankle wounds.  Stable left anterior shin wound.  Nearly healed left posterior calf wound      Musculoskeletal: no muscle wasting or atrophy  Neurologic: A&O X 3;  No focal weakness or paresthesias are detected Psychiatric:  The pt has Normal affect.   Medical Decision Making   [name] is a 87 y.o. male who presents for wound check  The patient still has monophasic DP/PT Doppler signals on the right PT he has faint monophasic DP and peroneal signals on the left. He is currently  without rest pain or new wounds.  He does not ambulate enough to claudicate His wounds on the right great toe and ankle have improved since 2 weeks ago.  His wound on the left anterior shin is unchanged.  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  He will also follow back up with the wound care center.  At this time since his wound is stable, we will defer  angiography.  He will follow-up with our office in 1 month with ABIs and bilateral aortoiliac duplex study.  At that time if his left anterior shin wound has not progressed or if he develops new foot wounds, we will pursue repeat angiography   Vicente Serene PA-C [facility] Office: [facility] MD: Virl Cagey

## 2022-02-10 ENCOUNTER — Encounter (HOSPITAL_BASED_OUTPATIENT_CLINIC_OR_DEPARTMENT_OTHER): Payer: Medicare Other | Attending: Internal Medicine | Admitting: Internal Medicine

## 2022-02-10 DIAGNOSIS — L97514 Non-pressure chronic ulcer of other part of right foot with necrosis of bone: Secondary | ICD-10-CM | POA: Insufficient documentation

## 2022-02-10 DIAGNOSIS — M1990 Unspecified osteoarthritis, unspecified site: Secondary | ICD-10-CM | POA: Insufficient documentation

## 2022-02-10 DIAGNOSIS — S81802A Unspecified open wound, left lower leg, initial encounter: Secondary | ICD-10-CM

## 2022-02-10 DIAGNOSIS — X58XXXA Exposure to other specified factors, initial encounter: Secondary | ICD-10-CM | POA: Insufficient documentation

## 2022-02-10 DIAGNOSIS — M109 Gout, unspecified: Secondary | ICD-10-CM | POA: Diagnosis not present

## 2022-02-10 DIAGNOSIS — I739 Peripheral vascular disease, unspecified: Secondary | ICD-10-CM

## 2022-02-10 DIAGNOSIS — Z87891 Personal history of nicotine dependence: Secondary | ICD-10-CM | POA: Diagnosis not present

## 2022-02-10 DIAGNOSIS — I4891 Unspecified atrial fibrillation: Secondary | ICD-10-CM | POA: Insufficient documentation

## 2022-02-10 DIAGNOSIS — L97918 Non-pressure chronic ulcer of unspecified part of right lower leg with other specified severity: Secondary | ICD-10-CM | POA: Diagnosis not present

## 2022-02-10 DIAGNOSIS — E11621 Type 2 diabetes mellitus with foot ulcer: Secondary | ICD-10-CM

## 2022-02-10 DIAGNOSIS — E1140 Type 2 diabetes mellitus with diabetic neuropathy, unspecified: Secondary | ICD-10-CM | POA: Diagnosis not present

## 2022-02-10 DIAGNOSIS — M86171 Other acute osteomyelitis, right ankle and foot: Secondary | ICD-10-CM | POA: Insufficient documentation

## 2022-02-10 DIAGNOSIS — E1151 Type 2 diabetes mellitus with diabetic peripheral angiopathy without gangrene: Secondary | ICD-10-CM | POA: Diagnosis not present

## 2022-02-11 ENCOUNTER — Other Ambulatory Visit: Payer: Self-pay

## 2022-02-11 DIAGNOSIS — I70223 Atherosclerosis of native arteries of extremities with rest pain, bilateral legs: Secondary | ICD-10-CM

## 2022-02-11 DIAGNOSIS — I7025 Atherosclerosis of native arteries of other extremities with ulceration: Secondary | ICD-10-CM

## 2022-02-11 NOTE — Progress Notes (Signed)
[name], [name] ([id number]) [id number] Page 1 of 12 Visit Report for 02/10/2022 Chief Complaint Document Details Patient Name: Date of Service: CORDARIUS, BENNING MES L. 02/10/2022 10:45 A M Medical Record Number: [id number] Patient Account Number: [id number] Date of Birth/Sex: Treating RN: [birth year] (87 y.o. M) Primary Care Provider: Dorthy Cooler, Dibas Other Clinician: Referring Provider: Treating Provider/Extender: Alanda Slim, Dibas Weeks in Treatment: 24 Information Obtained from: Patient Chief Complaint right foot wound, Right leg wound and left leg wound Electronic Signature(s) Signed: 02/10/2022 1:42:51 PM By: Kalman Shan DO Entered By: Kalman Shan on 02/10/2022 10:50:13 -------------------------------------------------------------------------------- Debridement Details Patient Name: Date of Service: [name] MES L. 02/10/2022 10:45 A M Medical Record Number: [id number] Patient Account Number: [id number] Date of Birth/Sex: Treating RN: 1935/04/07 (87 y.o. [name] Primary Care Provider: Dorthy Cooler, Dibas Other Clinician: Referring Provider: Treating Provider/Extender: Alanda Slim, Dibas Weeks in Treatment: 24 Debridement Performed for Assessment: Wound #2 Right T Great oe Performed By: Physician Kalman Shan, DO Debridement Type: Debridement Severity of Tissue Pre Debridement: Fat layer exposed Level of Consciousness (Pre-procedure): Awake and Alert Pre-procedure Verification/Time Out Yes - 11:35 Taken: Start Time: 11:36 Pain Control: Lidocaine 4% T opical Solution T Area Debrided (L x W): otal 0.5 (cm) x 1 (cm) = 0.5 (cm) Tissue and other material debrided: Viable, Non-Viable, Skin: Dermis , Skin: Epidermis Level: Skin/Epidermis Debridement Description: Selective/Open Wound Instrument: Curette, N/A Bleeding: None Hemostasis Achieved: Pressure End Time: 11:40 Procedural Pain: 0 Post Procedural Pain:  0 Response to Treatment: Procedure was tolerated well Level of Consciousness (Post- Awake and Alert procedure): Post Debridement Measurements of Total Wound Length: (cm) 0.5 Width: (cm) 1 Depth: (cm) 0.1 Volume: (cm) 0.039 Character of Wound/Ulcer Post Debridement: Improved Severity of Tissue Post Debridement: Fat layer exposed [name] ([id number]) [id number] Page 2 of 12 Post Procedure Diagnosis Same as Pre-procedure Electronic Signature(s) Signed: 02/10/2022 1:42:51 PM By: Kalman Shan DO Signed: 02/10/2022 6:17:04 PM By: Deon Pilling RN, BSN Entered By: Deon Pilling on 02/10/2022 11:41:04 -------------------------------------------------------------------------------- Debridement Details Patient Name: Date of Service: [name] MES L. 02/10/2022 10:45 A M Medical Record Number: [id number] Patient Account Number: [id number] Date of Birth/Sex: Treating RN: 06-15-1935 (87 y.o. [name] Primary Care Provider: Dorthy Cooler, Dibas Other Clinician: Referring Provider: Treating Provider/Extender: Alanda Slim, Dibas Weeks in Treatment: 24 Debridement Performed for Assessment: Wound #3 Right,Anterior Ankle Performed By: Clinician Deon Pilling, RN Debridement Type: Chemical/Enzymatic/Mechanical Agent Used: Santyl Severity of Tissue Pre Debridement: Fat layer exposed Level of Consciousness (Pre-procedure): Awake and Alert Pre-procedure Verification/Time Out No Taken: Pain Control: Lidocaine 4% Topical Solution Bleeding: None Hemostasis Achieved: Pressure Procedural Pain: 0 Post Procedural Pain: 0 Response to Treatment: Procedure was tolerated well Level of Consciousness (Post- Awake and Alert procedure): Post Debridement Measurements of Total Wound Length: (cm) 1.1 Width: (cm) 0.9 Depth: (cm) 0.2 Volume: (cm) 0.156 Character of Wound/Ulcer Post Debridement: Requires Further Debridement Severity of Tissue Post Debridement:  Fat layer exposed Post Procedure Diagnosis Same as Pre-procedure Electronic Signature(s) Signed: 02/10/2022 1:42:51 PM By: Kalman Shan DO Signed: 02/10/2022 6:17:04 PM By: Deon Pilling RN, BSN Entered By: Deon Pilling on 02/10/2022 11:42:06 -------------------------------------------------------------------------------- Debridement Details Patient Name: Date of Service: [name] MES L. 02/10/2022 10:45 A M Medical Record Number: [id number] Patient Account Number: [id number] Date of Birth/Sex: Treating RN: 1935/06/05 (87 y.o. [name] Primary Care Provider: Dorthy Cooler, Dibas Other Clinician: Referring Provider: Treating Provider/Extender: Alanda Slim, Dibas Weeks in Treatment: 667 [name] ([id number]) [id number] Page 3 of  12 Debridement Performed for Assessment: Wound #4 Left,Anterior Ankle Performed By: Clinician Deon Pilling, RN Debridement Type: Chemical/Enzymatic/Mechanical Agent Used: Santyl Severity of Tissue Pre Debridement: Fat layer exposed Level of Consciousness (Pre-procedure): Awake and Alert Pre-procedure Verification/Time Out No Taken: Pain Control: Lidocaine 4% Topical Solution Bleeding: None Hemostasis Achieved: Pressure Procedural Pain: 0 Post Procedural Pain: 0 Response to Treatment: Procedure was tolerated well Level of Consciousness (Post- Awake and Alert procedure): Post Debridement Measurements of Total Wound Length: (cm) 4.5 Width: (cm) 2 Depth: (cm) 0.1 Volume: (cm) 0.707 Character of Wound/Ulcer Post Debridement: Requires Further Debridement Severity of Tissue Post Debridement: Fat layer exposed Post Procedure Diagnosis Same as Pre-procedure Electronic Signature(s) Signed: 02/10/2022 1:42:51 PM By: Kalman Shan DO Signed: 02/10/2022 6:17:04 PM By: Deon Pilling RN, BSN Entered By: Deon Pilling on 02/10/2022  11:42:28 -------------------------------------------------------------------------------- HPI Details Patient Name: Date of Service: [name] MES L. 02/10/2022 10:45 A M Medical Record Number: [id number] Patient Account Number: [id number] Date of Birth/Sex: Treating RN: [birth year] (87 y.o. M) Primary Care Provider: Dorthy Cooler, Dibas Other Clinician: Referring Provider: Treating Provider/Extender: Alanda Slim, Dibas Weeks in Treatment: 24 History of Present Illness HPI Description: Admission 08/26/2021 Mr. Raymond Bhardwaj is an 87 year old male with a past medical history of controlled type 2 diabetes on oral agents with peripheral neuropathy and Atrial fibrillation not on blood thinners that presents to the clinic with a right foot wound that has waxed and waned in healing over the past year. He has peripheral neuropathy and is not sure when the wound started or how it started. He had an MRI done by his primary care physician on 08/03/2021 that showed potential early acute osteomyelitis at the posterior calcaneus. He was seen by Dr. Linus Salmons on 08/06/2021 and started on doxycycline and cefadroxil due to these findings. He was evaluated by podiatry, Dr. Jacqualyn Posey and had a bone biopsy done on 08/13/2021 that did not show evidence of osteomyelitis. He had a culture that showed MRSA sensitive to doxycycline. He also had ABIs with TBI's done on 8/2 that showed an ABI of 0.58 and a noncompressible TBI. He is scheduled to see vein and vascular on 8/30. Currently he is keeping the wound covered. He is using a soft surgical shoe. He denies signs of infection. 8/5; patient presents for follow-up. He had follow-up with Dr. Linus Salmons On 8/23. Plan is for 6 weeks of doxycycline through September 16. Although path was negative for osteo there is still concern due to positive culture from the bone. Unfortunately patient missed his vein and vascular appointment to assess blood flow status. This has been rescheduled for  9/8. Patient has been using Medihoney to the wound bed. He tries to offload the wound bed but has trouble at night keeping pressure off of it. He does not have Prevalon boots. He currently denies signs of infection. 9/19; patient presents for follow-up. He is scheduled to have a bilateral lower extremity angiogram on 9/20. He currently denies signs of infection. He has been using Medihoney to the wound bed. 11/13; patient has missed his last follow-up. In fact he has not been here in 2 months. He had bilateral iliofemoral endarterectomy with retrograde iliac stenting on 10/09/2021. He has been using Medihoney with Hydrofera Blue to the heel wound. He reports improvement in wound healing here. 2 weeks ago he developed a wound to the dorsal aspect of the great toe. He states His shoes cause this issue and he is currently wearing different shoes. He is on clindamycin as there  was concern for infection to the right great toe. He currently denies signs of infection. He has been keeping the great toe open to air. 12/4; patient presents for follow-up. He has been using Medihoney and Hydrofera Blue to the heel wound and right great toe wound. The heel wound is closed. The right great toe wound has bone exposed. He now has a new wound to the right ankle that he states he used to shoehorn to scratch the area and created the wound. He has not been dressing this area. ZAYIN, VALADEZ ([id number]) [id number] Page 4 of 12 12/21; patient presents for follow-up. He has been using Hydrofera Blue and Medihoney to the right anterior leg wound and right toe wound. There is been improvement in wound healing. He has been taking doxycycline prescribed at last clinic visit. He has no issues or complaints today. 1/5; patient presents for follow-up. He has been using Hydrofera Blue and Medihoney to the right anterior leg wound and right toe wound. Unfortunately has developed a new wound to the left  lower extremity. He is not sure how this started. He has completed his course of doxycycline. He denies signs of infection. He is scheduled to see vein and vascular for follow-up on 1/12. 1/18; patient presents for follow-up. He saw vein and vascular on 1/12 for evaluation of a new wound to his left lower extremity. They recommended he continue to elevate his legs. They stated that if the wound became worse or did not improve he will need repeat angiogram. Currently patient denies signs of infection. He has been using Medihoney and Hydrofera Blue to the wound beds. 2/6; patient missed his last clinic appointment. He has been using Medihoney and Hydrofera Blue to the wound beds. He saw vein and vascular on 2/2. No plan for angiography at this time. Patient has no issues or complaints today. Electronic Signature(s) Signed: 02/10/2022 1:42:51 PM By: Kalman Shan DO Entered By: Kalman Shan on 02/10/2022 11:47:25 -------------------------------------------------------------------------------- Physical Exam Details Patient Name: Date of Service: [name] MES L. 02/10/2022 10:45 A M Medical Record Number: [id number] Patient Account Number: [id number] Date of Birth/Sex: Treating RN: 06/28/1935 (87 y.o. M) Primary Care Provider: Dorthy Cooler, Dibas Other Clinician: Referring Provider: Treating Provider/Extender: Alanda Slim, Dibas Weeks in Treatment: 24 Constitutional respirations regular, non-labored and within target range for patient.. Cardiovascular 2+ dorsalis pedis/posterior tibialis pulses. Psychiatric pleasant and cooperative. Notes T the dorsal aspect of the right great toe there is an open wound with granulation tissue and nonviable tissue. No bone exposed. T the right anterior ankle o o crease there is an open wound with granulation tissue Nonviable tissue. No signs of infection. Wound to the left anterior leg with Mostly granulation tissue throughout. No signs of  infection to any of the wound beds. Electronic Signature(s) Signed: 02/10/2022 1:42:51 PM By: Kalman Shan DO Entered By: Kalman Shan on 02/10/2022 11:48:25 -------------------------------------------------------------------------------- Physician Orders Details Patient Name: Date of Service: [name] MES L. 02/10/2022 10:45 A M Medical Record Number: [id number] Patient Account Number: [id number] Date of Birth/Sex: Treating RN: Jan 20, 1935 (87 y.o. [name] Primary Care Provider: Dorthy Cooler, Dibas Other Clinician: Referring Provider: Treating Provider/Extender: Alanda Slim, Dibas Weeks in Treatment: 80 Verbal / Phone Orders: No Diagnosis Coding ICD-10 Coding Code Description E11.621 Type 2 diabetes mellitus with foot ulcer MICAI, APOLINAR L ([id number]) [id number] Page 5 of 12 I73.9 Peripheral vascular disease, unspecified M86.171 Other acute osteomyelitis, right ankle and foot E11.40 Type 2 diabetes mellitus with diabetic neuropathy, unspecified  L97.514 Non-pressure chronic ulcer of other part of right foot with necrosis of bone L97.918 Non-pressure chronic ulcer of unspecified part of right lower leg with other specified severity S81.802A Unspecified open wound, left lower leg, initial encounter Follow-up Appointments ppointment in 2 weeks. - Dr. Heber Doyline 1245 02/10/2022 overflow Return A Other: - F/U with Vein and Vascular if wounds get worse. pick up santyl from pharmacy. Anesthetic (In clinic) Topical Lidocaine 5% applied to wound bed (In clinic) Topical Lidocaine 4% applied to wound bed Bathing/ Shower/ Hygiene May shower and wash wound with soap and water. Edema Control - Lymphedema / SCD / Other Elevate legs to the level of the heart or above for 30 minutes daily and/or when sitting for 3-4 times a day throughout the day. Avoid standing for long periods of time. Moisturize legs daily. Off-Loading Open toe surgical shoe to: -  Wear socks with your shoes Other: - -Keep pressure off area as much as is possible wear while resting in bed or chair- Prevalon Boot do not walk or stand with it. Additional Orders / Instructions Follow Nutritious Diet Wound Treatment Wound #2 - T Great oe Wound Laterality: Right Cleanser: Soap and Water 1 x Per Day/7 Days Discharge Instructions: May shower and wash wound with dial antibacterial soap and water prior to dressing change. Peri-Wound Care: Skin Prep (Generic) 1 x Per Day/7 Days Discharge Instructions: Use skin prep as directed Prim Dressing: Santyl Ointment 1 x Per Day/7 Days ary Discharge Instructions: Apply nickel thick amount to wound bed as instructed Prim Dressing: Hydrofera Blue Ready Foam, 2.5 x2.5 in (Generic) 1 x Per Day/7 Days ary Discharge Instructions: Apply to wound bed as instructed Secondary Dressing: Woven Gauze Sponges 2x2 in 1 x Per Day/7 Days Discharge Instructions: Apply over primary dressing as directed. Secured With: 30M Medipore H Soft Cloth Surgical T ape, 4 x 10 (in/yd) 1 x Per Day/7 Days Discharge Instructions: Secure with tape as directed. Add-Ons: Cotton Tip Swabs (Generic) 1 x Per Day/7 Days Wound #3 - Ankle Wound Laterality: Right, Anterior Cleanser: Soap and Water 1 x Per Day/7 Days Discharge Instructions: May shower and wash wound with dial antibacterial soap and water prior to dressing change. Peri-Wound Care: Skin Prep (Generic) 1 x Per Day/7 Days Discharge Instructions: Use skin prep as directed Prim Dressing: Santyl Ointment 1 x Per Day/7 Days ary Discharge Instructions: Apply nickel thick amount to wound bed as instructed Prim Dressing: Hydrofera Blue Ready Foam, 2.5 x2.5 in (Generic) 1 x Per Day/7 Days ary Discharge Instructions: Apply to wound bed as instructed Secondary Dressing: Woven Gauze Sponges 2x2 in 1 x Per Day/7 Days Discharge Instructions: Apply over primary dressing as directed. Secured With: 30M Medipore H Soft Cloth  Surgical T ape, 4 x 10 (in/yd) 1 x Per Day/7 Days Discharge Instructions: Secure with tape as directed. Add-Ons: Cotton Tip Swabs (Generic) 1 x Per Day/7 Days Wound #4 - Ankle Wound Laterality: Left, Anterior TYQUAVIOUS, GAMEL ([id number]) [phone].pdf Page 6 of 12 Cleanser: Soap and Water 1 x Per Day/7 Days Discharge Instructions: May shower and wash wound with dial antibacterial soap and water prior to dressing change. Peri-Wound Care: Skin Prep (Generic) 1 x Per Day/7 Days Discharge Instructions: Use skin prep as directed Prim Dressing: Santyl Ointment 1 x Per Day/7 Days ary Discharge Instructions: Apply nickel thick amount to wound bed as instructed Prim Dressing: Hydrofera Blue Ready Foam, 2.5 x2.5 in (Generic) 1 x Per Day/7 Days ary Discharge Instructions: Apply to wound bed  as instructed Secondary Dressing: Woven Gauze Sponges 2x2 in 1 x Per Day/7 Days Discharge Instructions: Apply over primary dressing as directed. Secured With: 52M Medipore H Soft Cloth Surgical T ape, 4 x 10 (in/yd) 1 x Per Day/7 Days Discharge Instructions: Secure with tape as directed. Add-Ons: Cotton Tip Swabs (Generic) 1 x Per Day/7 Days Patient Medications llergies: allopurinol, atorvastatin, ibuprofen A Notifications Medication Indication Start End 02/10/2022 Santyl DOSE 1 - topical 250 unit/gram ointment - Apply once daily to the wound beds Electronic Signature(s) Signed: 02/10/2022 1:42:51 PM By: Kalman Shan DO Previous Signature: 02/10/2022 11:46:38 AM Version By: Kalman Shan DO Entered By: Kalman Shan on 02/10/2022 11:48:34 -------------------------------------------------------------------------------- Problem List Details Patient Name: Date of Service: [name] MES L. 02/10/2022 10:45 A M Medical Record Number: [id number] Patient Account Number: [id number] Date of Birth/Sex: Treating RN: [birth year] (87 y.o. M) Primary Care Provider: Dorthy Cooler, Dibas Other  Clinician: Referring Provider: Treating Provider/Extender: Alanda Slim, Dibas Weeks in Treatment: 24 Active Problems ICD-10 Encounter Code Description Active Date MDM Diagnosis E11.621 Type 2 diabetes mellitus with foot ulcer 08/26/2021 No Yes I73.9 Peripheral vascular disease, unspecified 08/26/2021 No Yes M86.171 Other acute osteomyelitis, right ankle and foot 08/26/2021 No Yes E11.40 Type 2 diabetes mellitus with diabetic neuropathy, unspecified 08/26/2021 No Yes L97.514 Non-pressure chronic ulcer of other part of right foot with necrosis of bone 12/08/2021 No Yes JAISON, PETRAGLIA ([id number]) [phone].pdf Page 7 of 12 L97.918 Non-pressure chronic ulcer of unspecified part of right lower leg with other 12/25/2021 No Yes specified severity S81.802A Unspecified open wound, left lower leg, initial encounter 01/09/2022 No Yes Inactive Problems Resolved Problems Electronic Signature(s) Signed: 02/10/2022 1:42:51 PM By: Kalman Shan DO Entered By: Kalman Shan on 02/10/2022 10:50:00 -------------------------------------------------------------------------------- Progress Note Details Patient Name: Date of Service: [name] MES L. 02/10/2022 10:45 A M Medical Record Number: [id number] Patient Account Number: [id number] Date of Birth/Sex: Treating RN: 28-Jul-1935 (87 y.o. M) Primary Care Provider: Dorthy Cooler, Dibas Other Clinician: Referring Provider: Treating Provider/Extender: Alanda Slim, Dibas Weeks in Treatment: 24 Subjective Chief Complaint Information obtained from Patient right foot wound, Right leg wound and left leg wound History of Present Illness (HPI) Admission 08/26/2021 [name] is an 87 year old male with a past medical history of controlled type 2 diabetes on oral agents with peripheral neuropathy and Atrial fibrillation not on blood thinners that presents to the clinic with a right foot wound that has waxed and  waned in healing over the past year. He has peripheral neuropathy and is not sure when the wound started or how it started. He had an MRI done by his primary care physician on 08/03/2021 that showed potential early acute osteomyelitis at the posterior calcaneus. He was seen by Dr. Linus Salmons on 08/06/2021 and started on doxycycline and cefadroxil due to these findings. He was evaluated by podiatry, Dr. Jacqualyn Posey and had a bone biopsy done on 08/13/2021 that did not show evidence of osteomyelitis. He had a culture that showed MRSA sensitive to doxycycline. He also had ABIs with TBI's done on 8/2 that showed an ABI of 0.58 and a noncompressible TBI. He is scheduled to see vein and vascular on 8/30. Currently he is keeping the wound covered. He is using a soft surgical shoe. He denies signs of infection. 8/5; patient presents for follow-up. He had follow-up with Dr. Linus Salmons On 8/23. Plan is for 6 weeks of doxycycline through September 16. Although path was negative for osteo there is still concern due to positive culture from  the bone. Unfortunately patient missed his vein and vascular appointment to assess blood flow status. This has been rescheduled for 9/8. Patient has been using Medihoney to the wound bed. He tries to offload the wound bed but has trouble at night keeping pressure off of it. He does not have Prevalon boots. He currently denies signs of infection. 9/19; patient presents for follow-up. He is scheduled to have a bilateral lower extremity angiogram on 9/20. He currently denies signs of infection. He has been using Medihoney to the wound bed. 11/13; patient has missed his last follow-up. In fact he has not been here in 2 months. He had bilateral iliofemoral endarterectomy with retrograde iliac stenting on 10/09/2021. He has been using Medihoney with Hydrofera Blue to the heel wound. He reports improvement in wound healing here. 2 weeks ago he developed a wound to the dorsal aspect of the great toe. He  states His shoes cause this issue and he is currently wearing different shoes. He is on clindamycin as there was concern for infection to the right great toe. He currently denies signs of infection. He has been keeping the great toe open to air. 12/4; patient presents for follow-up. He has been using Medihoney and Hydrofera Blue to the heel wound and right great toe wound. The heel wound is closed. The right great toe wound has bone exposed. He now has a new wound to the right ankle that he states he used to shoehorn to scratch the area and created the wound. He has not been dressing this area. 12/21; patient presents for follow-up. He has been using Hydrofera Blue and Medihoney to the right anterior leg wound and right toe wound. There is been improvement in wound healing. He has been taking doxycycline prescribed at last clinic visit. He has no issues or complaints today. 1/5; patient presents for follow-up. He has been using Hydrofera Blue and Medihoney to the right anterior leg wound and right toe wound. Unfortunately has developed a new wound to the left lower extremity. He is not sure how this started. He has completed his course of doxycycline. He denies signs of infection. He is scheduled to see vein and vascular for follow-up on 1/12. 1/18; patient presents for follow-up. He saw vein and vascular on 1/12 for evaluation of a new wound to his left lower extremity. They recommended he continue to elevate his legs. They stated that if the wound became worse or did not improve he will need repeat angiogram. Currently patient denies signs of infection. He has been using Medihoney and Hydrofera Blue to the wound beds. ZAEEM, KANDEL ([id number]) [id number] Page 8 of 12 2/6; patient missed his last clinic appointment. He has been using Medihoney and Hydrofera Blue to the wound beds. He saw vein and vascular on 2/2. No plan for angiography at this time. Patient has no  issues or complaints today. Patient History Information obtained from Patient, Chart. Family History Diabetes - Child, Stroke - Mother, No family history of Cancer, Heart Disease, Hereditary Spherocytosis, Hypertension, Kidney Disease, Lung Disease, Seizures, Thyroid Problems, Tuberculosis. Social History Former smoker, Marital Status - Divorced, Alcohol Use - Moderate, Drug Use - Prior History, Caffeine Use - Daily. Medical History Eyes Patient has history of Cataracts - Surgery Cardiovascular Patient has history of Angina - AFib Endocrine Patient has history of Type II Diabetes Musculoskeletal Patient has history of Gout, Osteoarthritis Denies history of Rheumatoid Arthritis, Osteomyelitis Neurologic Patient has history of Neuropathy Hospitalization/Surgery History - inpatient 09/2021 arterial stenting  on legs.. Medical A Surgical History Notes nd Cardiovascular Hx CVA Objective Constitutional respirations regular, non-labored and within target range for patient.. Vitals Time Taken: 11:05 AM, Height: 68 in, Weight: 198 lbs, BMI: 30.1, Temperature: 98 F, Pulse: 88 bpm, Respiratory Rate: 18 breaths/min, Blood Pressure: 136/70 mmHg. Cardiovascular 2+ dorsalis pedis/posterior tibialis pulses. Psychiatric pleasant and cooperative. General Notes: T the dorsal aspect of the right great toe there is an open wound with granulation tissue and nonviable tissue. No bone exposed. T the right o o anterior ankle crease there is an open wound with granulation tissue Nonviable tissue. No signs of infection. Wound to the left anterior leg with Mostly granulation tissue throughout. No signs of infection to any of the wound beds. Integumentary (Hair, Skin) Wound #2 status is Open. Original cause of wound was Trauma. The date acquired was: 11/10/2021. The wound has been in treatment 12 weeks. The wound is located on the Right T Great. The wound measures 0.5cm length x 1cm width x 0.1cm depth;  0.393cm^2 area and 0.039cm^3 volume. There is bone and Fat oe Layer (Subcutaneous Tissue) exposed. There is no tunneling or undermining noted. There is a medium amount of serosanguineous drainage noted. The wound margin is distinct with the outline attached to the wound base. There is no granulation within the wound bed. There is a large (67-100%) amount of necrotic tissue within the wound bed including Eschar and Adherent Slough. The periwound skin appearance did not exhibit: Callus, Crepitus, Excoriation, Induration, Rash, Scarring, Dry/Scaly, Maceration, Atrophie Blanche, Cyanosis, Ecchymosis, Hemosiderin Staining, Mottled, Pallor, Rubor, Erythema. Wound #3 status is Open. Original cause of wound was Shear/Friction. The date acquired was: 12/04/2021. The wound has been in treatment 9 weeks. The wound is located on the Right,Anterior Ankle. The wound measures 1.1cm length x 0.9cm width x 0.2cm depth; 0.778cm^2 area and 0.156cm^3 volume. There is no tunneling or undermining noted. There is a none present amount of drainage noted. The wound margin is distinct with the outline attached to the wound base. There is small (1-33%) granulation within the wound bed. There is a large (67-100%) amount of necrotic tissue within the wound bed including Adherent Slough. The periwound skin appearance did not exhibit: Callus, Crepitus, Excoriation, Induration, Rash, Scarring, Dry/Scaly, Maceration, Atrophie Blanche, Cyanosis, Ecchymosis, Hemosiderin Staining, Mottled, Pallor, Rubor, Erythema. Wound #4 status is Open. Original cause of wound was Gradually Appeared. The date acquired was: 01/02/2022. The wound has been in treatment 4 weeks. The wound is located on the Left,Anterior Ankle. The wound measures 4.5cm length x 2cm width x 0.1cm depth; 7.069cm^2 area and 0.707cm^3 volume. There is no tunneling or undermining noted. There is a medium amount of serosanguineous drainage noted. The wound margin is distinct with  the outline attached to the wound base. There is medium (34-66%) granulation within the wound bed. There is a medium (34-66%) amount of necrotic tissue within the wound bed including Eschar. The periwound skin appearance exhibited: Maceration. The periwound skin appearance did not exhibit: Callus, Crepitus, Excoriation, Induration, Rash, Scarring, Dry/Scaly, Atrophie Blanche, Cyanosis, Ecchymosis, Hemosiderin Staining, Mottled, Pallor, Rubor, Erythema. Periwound temperature was noted as No Abnormality. Kenneth, Hood ([id number]) [id number] Page 9 of 12 Assessment Active Problems ICD-10 Type 2 diabetes mellitus with foot ulcer Peripheral vascular disease, unspecified Other acute osteomyelitis, right ankle and foot Type 2 diabetes mellitus with diabetic neuropathy, unspecified Non-pressure chronic ulcer of other part of right foot with necrosis of bone Non-pressure chronic ulcer of unspecified part of right lower leg with  other specified severity Unspecified open wound, left lower leg, initial encounter Patient's wounds overall appear well-healing. No signs of infection. I debrided nonviable tissue to the right great toe. I recommended continuing the course with Hydrofera Blue but switching to Santyl and stopping the Medihoney to all wound beds. He has a mixed picture of venous and arterial insufficiency. Plan per vein and vascular is to do an arteriogram in 1 month if the left lower extremity wounds have not healed. For now he is to elevate his legs. Follow-up in 2 weeks. Procedures Wound #2 Pre-procedure diagnosis of Wound #2 is a Diabetic Wound/Ulcer of the Lower Extremity located on the Right T Great .Severity of Tissue Pre Debridement is: oe Fat layer exposed. There was a Selective/Open Wound Skin/Epidermis Debridement with a total area of 0.5 sq cm performed by Kalman Shan, DO. With the following instrument(s): Curette to remove Viable and Non-Viable  tissue/material. Material removed includes Skin: Dermis and Skin: Epidermis and after achieving pain control using Lidocaine 4% T opical Solution. A time out was conducted at 11:35, prior to the start of the procedure. There was no bleeding. The procedure was tolerated well with a pain level of 0 throughout and a pain level of 0 following the procedure. Post Debridement Measurements: 0.5cm length x 1cm width x 0.1cm depth; 0.039cm^3 volume. Character of Wound/Ulcer Post Debridement is improved. Severity of Tissue Post Debridement is: Fat layer exposed. Post procedure Diagnosis Wound #2: Same as Pre-Procedure Wound #3 Pre-procedure diagnosis of Wound #3 is a Diabetic Wound/Ulcer of the Lower Extremity located on the Right,Anterior Ankle .Severity of Tissue Pre Debridement is: Fat layer exposed. There was a Chemical/Enzymatic/Mechanical debridement performed by Deon Pilling, RN. to remove Viable and Non- Viable tissue/material. Material removed includes Skin: Dermis and Skin: Epidermis and after achieving pain control using Lidocaine 4% T opical Solution. Agent used was [employer]. There was no bleeding. The procedure was tolerated well with a pain level of 0 throughout and a pain level of 0 following the procedure. Post Debridement Measurements: 1.1cm length x 0.9cm width x 0.2cm depth; 0.156cm^3 volume. Character of Wound/Ulcer Post Debridement requires further debridement. Severity of Tissue Post Debridement is: Fat layer exposed. Post procedure Diagnosis Wound #3: Same as Pre-Procedure Wound #4 Pre-procedure diagnosis of Wound #4 is a Diabetic Wound/Ulcer of the Lower Extremity located on the Left,Anterior Ankle .Severity of Tissue Pre Debridement is: Fat layer exposed. There was a Selective/Open Wound Skin/Epidermis Chemical/Enzymatic/Mechanical performed by Deon Pilling, RN. to remove Viable and Non-Viable tissue/material. Material removed includes Skin: Dermis and Skin: Epidermis and after  achieving pain control using Lidocaine 4% T opical Solution. Agent used was [employer]. There was no bleeding. The procedure was tolerated well with a pain level of 0 throughout and a pain level of 0 following the procedure. Post Debridement Measurements: 4.5cm length x 2cm width x 0.1cm depth; 0.707cm^3 volume. Character of Wound/Ulcer Post Debridement requires further debridement. Severity of Tissue Post Debridement is: Fat layer exposed. Post procedure Diagnosis Wound #4: Same as Pre-Procedure Plan Follow-up Appointments: Return Appointment in 2 weeks. - Dr. Heber [place] [phone] 02/10/2022 overflow Other: - F/U with Vein and Vascular if wounds get worse. pick up santyl from pharmacy. Anesthetic: (In clinic) Topical Lidocaine 5% applied to wound bed (In clinic) Topical Lidocaine 4% applied to wound bed Bathing/ Shower/ Hygiene: May shower and wash wound with soap and water. Edema Control - Lymphedema / SCD / Other: Elevate legs to the level of the heart or above for 30 minutes  daily and/or when sitting for 3-4 times a day throughout the day. Avoid standing for long periods of time. Moisturize legs daily. Off-Loading: Open toe surgical shoe to: - Wear socks with your shoes Other: - -Keep pressure off area as much as is possible wear while resting in bed or chair- Prevalon Boot do not walk or stand with it. Additional Orders / Instructions: Follow Nutritious Diet The following medication(s) was prescribed: Santyl topical 250 unit/gram ointment 1 Apply once daily to the wound beds starting 02/10/2022 WOUND #2: - T Great Wound Laterality: Right oe Cleanser: Soap and Water 1 x Per Day/7 Days Discharge Instructions: May shower and wash wound with dial antibacterial soap and water prior to dressing change. Peri-Wound Care: Skin Prep (Generic) 1 x Per Day/7 Days Discharge Instructions: Use skin prep as directed Prim Dressing: Santyl Ointment 1 x Per Day/7 Days ary Discharge Instructions: Apply nickel thick  amount to wound bed as instructed OSMIN, WELZ ([id number]) [phone].pdf Page 10 of 12 Prim Dressing: Hydrofera Blue Ready Foam, 2.5 x2.5 in (Generic) 1 x Per Day/7 Days ary Discharge Instructions: Apply to wound bed as instructed Secondary Dressing: Woven Gauze Sponges 2x2 in 1 x Per Day/7 Days Discharge Instructions: Apply over primary dressing as directed. Secured With: 92M Medipore H Soft Cloth Surgical T ape, 4 x 10 (in/yd) 1 x Per Day/7 Days Discharge Instructions: Secure with tape as directed. Add-Ons: Cotton Tip Swabs (Generic) 1 x Per Day/7 Days WOUND #3: - Ankle Wound Laterality: Right, Anterior Cleanser: Soap and Water 1 x Per Day/7 Days Discharge Instructions: May shower and wash wound with dial antibacterial soap and water prior to dressing change. Peri-Wound Care: Skin Prep (Generic) 1 x Per Day/7 Days Discharge Instructions: Use skin prep as directed Prim Dressing: Santyl Ointment 1 x Per Day/7 Days ary Discharge Instructions: Apply nickel thick amount to wound bed as instructed Prim Dressing: Hydrofera Blue Ready Foam, 2.5 x2.5 in (Generic) 1 x Per Day/7 Days ary Discharge Instructions: Apply to wound bed as instructed Secondary Dressing: Woven Gauze Sponges 2x2 in 1 x Per Day/7 Days Discharge Instructions: Apply over primary dressing as directed. Secured With: 92M Medipore H Soft Cloth Surgical T ape, 4 x 10 (in/yd) 1 x Per Day/7 Days Discharge Instructions: Secure with tape as directed. Add-Ons: Cotton Tip Swabs (Generic) 1 x Per Day/7 Days WOUND #4: - Ankle Wound Laterality: Left, Anterior Cleanser: Soap and Water 1 x Per Day/7 Days Discharge Instructions: May shower and wash wound with dial antibacterial soap and water prior to dressing change. Peri-Wound Care: Skin Prep (Generic) 1 x Per Day/7 Days Discharge Instructions: Use skin prep as directed Prim Dressing: Santyl Ointment 1 x Per Day/7 Days ary Discharge Instructions: Apply nickel  thick amount to wound bed as instructed Prim Dressing: Hydrofera Blue Ready Foam, 2.5 x2.5 in (Generic) 1 x Per Day/7 Days ary Discharge Instructions: Apply to wound bed as instructed Secondary Dressing: Woven Gauze Sponges 2x2 in 1 x Per Day/7 Days Discharge Instructions: Apply over primary dressing as directed. Secured With: 92M Medipore H Soft Cloth Surgical T ape, 4 x 10 (in/yd) 1 x Per Day/7 Days Discharge Instructions: Secure with tape as directed. Add-Ons: Cotton Tip Swabs (Generic) 1 x Per Day/7 Days 1. Santyl and Hydrofera Blue to the wound beds 2. In office sharp debridement 3. Follow-up in 2 weeks 4. Elevate legs Electronic Signature(s) Signed: 02/10/2022 1:42:51 PM By: Kalman Shan DO Entered By: Kalman Shan on 02/10/2022 11:50:21 -------------------------------------------------------------------------------- HxROS Details Patient Name:  Date of Service: [name], [name] MES L. 02/10/2022 10:45 A M Medical Record Number: [id number] Patient Account Number: [id number] Date of Birth/Sex: Treating RN: 1935-03-09 (87 y.o. M) Primary Care Provider: Dorthy Cooler, [place] Other Clinician: Referring Provider: Treating Provider/Extender: Alanda Slim, [place] Weeks in Treatment: 24 Information Obtained From Patient Chart Eyes Medical History: Positive for: Cataracts - Surgery Cardiovascular Medical History: Positive for: Angina - AFib Past Medical History Notes: Hx CVA Endocrine Medical HistoryJAMAUL, Hood ([id number]) [phone].pdf Page 11 of 12 Positive for: Type II Diabetes Treated with: Oral agents, Diet Blood Kenneth tested every day: No Musculoskeletal Medical History: Positive for: Gout; Osteoarthritis Negative for: Rheumatoid Arthritis; Osteomyelitis Neurologic Medical History: Positive for: Neuropathy HBO Extended History Items Eyes: Cataracts Immunizations Pneumococcal Vaccine: Received Pneumococcal Vaccination:  No Implantable Devices None Hospitalization / Surgery History Type of Hospitalization/Surgery inpatient 09/2021 arterial stenting on legs. Family and Social History Cancer: No; Diabetes: Yes - Child; Heart Disease: No; Hereditary Spherocytosis: No; Hypertension: No; Kidney Disease: No; Lung Disease: No; Seizures: No; Stroke: Yes - Mother; Thyroid Problems: No; Tuberculosis: No; Former smoker; Marital Status - Divorced; Alcohol Use: Moderate; Drug Use: Prior History; Caffeine Use: Daily; Financial Concerns: No; Food, Clothing or Shelter Needs: No; Support System Lacking: No; Transportation Concerns: No Electronic Signature(s) Signed: 02/10/2022 1:42:51 PM By: Kalman Shan DO Entered By: Kalman Shan on 02/10/2022 11:47:30 -------------------------------------------------------------------------------- SuperBill Details Patient Name: Date of Service: [name] MES L. 02/10/2022 Medical Record Number: [id number] Patient Account Number: [id number] Date of Birth/Sex: Treating RN: 12-11-1935 (87 y.o. M) Primary Care Provider: Dorthy Cooler, [place] Other Clinician: Referring Provider: Treating Provider/Extender: Alanda Slim, [place] Weeks in Treatment: 24 Diagnosis Coding ICD-10 Codes Code Description E11.621 Type 2 diabetes mellitus with foot ulcer I73.9 Peripheral vascular disease, unspecified M86.171 Other acute osteomyelitis, right ankle and foot E11.40 Type 2 diabetes mellitus with diabetic neuropathy, unspecified L97.514 Non-pressure chronic ulcer of other part of right foot with necrosis of bone L97.918 Non-pressure chronic ulcer of unspecified part of right lower leg with other specified severity S81.802A Unspecified open wound, left lower leg, initial encounter Facility Procedures : Tompson, JAM 7 CPT4 Code: ES L ([id number]) [id number] ICD E1 L9 Description: [id number] - DEBRIDE WOUND 1ST 20 SQ CM OR < -10 Diagnosis Description 1.621 Type 2 diabetes mellitus  with foot ulcer 7.918 Non-pressure chronic ulcer of unspecified part of right lower leg with other specified Modifier: [id number]_51227. 1 severity Quantity: pdf Page 12 of 12 : 7 CPT4 Code: [id number] Description: 2 - DEBRIDE W/O ANES NON SELECT 5 Modifier: 9 1 Quantity: Physician Procedures : CPT4 Code Description Modifier [id number] - WC PHYS LEVEL 3 - EST PT ICD-10 Diagnosis Description S81.802A Unspecified open wound, left lower leg, initial encounter I73.9 Peripheral vascular disease, unspecified L97.918 Non-pressure chronic ulcer of  unspecified part of right lower leg with other specified severity Quantity: 1 : [id number] - WC PHYS DEBR WO ANESTH 20 SQ CM ICD-10 Diagnosis Description E11.621 Type 2 diabetes mellitus with foot ulcer L97.918 Non-pressure chronic ulcer of unspecified part of right lower leg with other specified severity Quantity: 1 Electronic Signature(s) Signed: 02/10/2022 6:17:04 PM By: Deon Pilling RN, BSN Signed: 02/11/2022 9:06:34 AM By: Kalman Shan DO Previous Signature: 02/10/2022 1:42:51 PM Version By: Kalman Shan DO Entered By: Deon Pilling on 02/10/2022 17:04:48

## 2022-02-11 NOTE — Progress Notes (Signed)
Kenneth, Hood ([id number]) [id number] Page 1 of 9 Visit Report for 02/10/2022 Arrival Information Details Patient Name: Date of Service: [name], Kenneth MES Hood. 02/10/2022 10:45 A M Medical Record Number: [id number] Patient Account Number: [id number] Date of Birth/Sex: Treating RN: 21-Dec-1935 (87 y.o. M) Primary Care [name]: [name], [name] Other Clinician: Referring [name]: Treating [name]/Extender: [name], [name] Weeks in Treatment: 24 Visit Information History Since Last Visit Added or deleted any medications: No Patient Arrived: Walker Any new allergies or adverse reactions: No Arrival Time: 11:02 Had a fall or experienced change in No Accompanied By: son activities of daily living that may affect Transfer Assistance: None risk of falls: Patient Identification Verified: Yes Signs or symptoms of abuse/neglect since last visito No Secondary Verification Process Completed: Yes Hospitalized since last visit: No Patient Requires Transmission-Based Precautions: No Implantable device outside of the clinic excluding No Patient Has Alerts: Yes cellular tissue based products placed in the center Patient Alerts: Patient on Blood Thinner since last visit: ABI R=.58 TBI R=[name] Has Dressing in Place as Prescribed: No Has Compression in Place as Prescribed: No Pain Present Now: Yes Electronic Signature(s) Signed: 02/11/2022 4:14:43 PM By: [name] Entered By: [name] on 02/10/2022 11:17:22 -------------------------------------------------------------------------------- Encounter Discharge Information Details Patient Name: Date of Service: Kenneth Mons MES Hood. 02/10/2022 10:45 A M Medical Record Number: [id number] Patient Account Number: [id number] Date of Birth/Sex: Treating RN: July 14, 1935 (87 y.o. [name] Primary Care Kara Melching: [name], [name] Other Clinician: Referring Jamy Whyte: Treating Verginia Toohey/Extender: [name],  [name] Weeks in Treatment: 24 Encounter Discharge Information Items Post Procedure Vitals Discharge Condition: Stable Temperature (F): 98 Ambulatory Status: Cane Pulse (bpm): 88 Discharge Destination: Home Respiratory Rate (breaths/min): 18 Transportation: Private Auto Blood Pressure (mmHg): 136/70 Accompanied By: son Schedule Follow-up Appointment: Yes Clinical Summary of Care: Electronic Signature(s) Signed: 02/10/2022 6:17:04 PM By: Kenneth Pilling RN, BSN Entered By: [name] on 02/10/2022 17:05:28 [name] ([id number]) [id number] Page 2 of 9 -------------------------------------------------------------------------------- Lower Extremity Assessment Details Patient Name: Date of Service: [name], Kenneth MES Hood. 02/10/2022 10:45 A M Medical Record Number: [id number] Patient Account Number: [id number] Date of Birth/Sex: Treating RN: [birth year] (87 y.o. M) Primary Care [name]: [name], [name] Other Clinician: Referring [name]: Treating [name]/Extender: [name], [name] Weeks in Treatment: 24 Edema Assessment Assessed: [Left: No] [Right: No] Edema: [Left: Ye] [Right: s] Calf Left: Right: Point of Measurement: 42 cm From Medial Instep 38 cm 38.7 cm Ankle Left: Right: Point of Measurement: 8 cm From Medial Instep 23 cm 23.5 cm Electronic Signature(s) Signed: 02/11/2022 4:14:43 PM By: [name] Entered By: [name] on 02/10/2022 11:11:24 -------------------------------------------------------------------------------- Multi Wound Chart Details Patient Name: Date of Service: Kenneth Mons MES Hood. 02/10/2022 10:45 A M Medical Record Number: [id number] Patient Account Number: [id number] Date of Birth/Sex: Treating RN: 02-12-1935 (87 y.o. M) Primary Care Earnest Thalman: [name], [name] Other Clinician: Referring Tawana Pasch: Treating [name]/Extender: [name], [name] Weeks in Treatment: 13 [Treatment Notes:Wound Assessments  Treatment Notes] Electronic Signature(s) Signed: 02/10/2022 1:42:51 PM By: Kenneth Shan DO Entered By: [name] on 02/10/2022 10:50:05 -------------------------------------------------------------------------------- Multi-Disciplinary Care Plan Details Patient Name: Date of Service: Kenneth Mons MES Hood. 02/10/2022 10:45 A M Medical Record Number: [id number] Patient Account Number: [id number] Date of Birth/Sex: Treating RN: 28-May-1935 (87 y.o. [name] Primary Care [name]: [name], [name] Other Clinician: Referring [name]: Treating [name]/Extender: [name], [name] Weeks in Treatment: [address] [name] ([id number]) [id number] Page 3 of 9 Active Inactive Pressure Nursing Diagnoses: Knowledge deficit related to  management of pressures ulcers Goals: Patient/caregiver will verbalize understanding of pressure ulcer management Date Initiated: 08/26/2021 Target Resolution Date: 03/06/2022 Goal Status: Active Interventions: Assess: immobility, friction, shearing, incontinence upon admission and as needed Assess offloading mechanisms upon admission and as needed Provide education on pressure ulcers Notes: Wound/Skin Impairment Nursing Diagnoses: Impaired tissue integrity Goals: Patient/caregiver will verbalize understanding of skin care regimen Date Initiated: 08/26/2021 Target Resolution Date: 05/01/2022 Goal Status: Active Ulcer/skin breakdown will have a volume reduction of 30% by week 4 Date Initiated: 08/26/2021 Date Inactivated: 01/09/2022 Target Resolution Date: 01/03/2022 Unmet Reason: see wound Goal Status: Unmet measurement. Interventions: Assess patient/caregiver ability to obtain necessary supplies Assess patient/caregiver ability to perform ulcer/skin care regimen upon admission and as needed Assess ulceration(s) every visit Provide education on ulcer and skin care Treatment Activities: Topical wound management  initiated : 08/26/2021 Notes: Electronic Signature(s) Signed: 02/10/2022 6:17:04 PM By: Kenneth Pilling RN, BSN Entered By: [name] on 02/10/2022 11:38:59 -------------------------------------------------------------------------------- Pain Assessment Details Patient Name: Date of Service: Kenneth Mons MES Hood. 02/10/2022 10:45 A M Medical Record Number: [id number] Patient Account Number: [id number] Date of Birth/Sex: Treating RN: Oct 07, 1935 (87 y.o. M) Primary Care Henrique Parekh: [name], [name] Other Clinician: Referring Conner Muegge: Treating Chrishonda Hesch/Extender: [name], [name] Weeks in Treatment: 24 Active Problems Location of Pain Severity and Description of Pain Patient Has Paino Yes Site Locations Pain LocationKEALII, THUESON ([id number]) [id number] Page 4 of 9 Pain Location: Pain in Ulcers Rate the pain. Current Pain Level: 3 Pain Management and Medication Current Pain Management: Electronic Signature(s) Signed: 02/11/2022 4:14:43 PM By: [name] Entered By: [name] on 02/10/2022 11:05:40 -------------------------------------------------------------------------------- Patient/Caregiver Education Details Patient Name: Date of Service: Kenneth Mons MES Hood. 2/6/2024andnbsp10:45 A M Medical Record Number: [id number] Patient Account Number: [id number] Date of Birth/Gender: Treating RN: 1935-10-16 (87 y.o. [name] Primary Care Physician: [name], [name] Other Clinician: Referring Physician: Treating Physician/Extender: [name], [name] Weeks in Treatment: 59 Education Assessment Education Provided To: Patient Education Topics Provided Wound/Skin Impairment: Handouts: Caring for Your Ulcer Methods: Explain/Verbal Responses: Reinforcements needed Electronic Signature(s) Signed: 02/10/2022 6:17:04 PM By: Kenneth Pilling RN, BSN Entered By: [name] on 02/10/2022  17:04:18 -------------------------------------------------------------------------------- Wound Assessment Details Patient Name: Date of Service: Kenneth Mons MES Hood. 02/10/2022 10:45 A M Medical Record Number: [id number] Patient Account Number: [id number] Date of Birth/Sex: Treating RN: 29-Jun-1935 (87 y.o. M) Primary Care Aiyanna Awtrey: Wolsey, [name] Other Clinician: SONNIE, [name] ([id number]) [id number] Page 5 of 9 Referring Dammon Makarewicz: Treating Sherrika Weakland/Extender: [name], [name] Weeks in Treatment: 24 Wound Status Wound Number: 2 Primary Etiology: Diabetic Wound/Ulcer of the Lower Extremity Wound Location: Right T Great oe Secondary Abrasion Etiology: Wounding Event: Trauma Wound Status: Open Date Acquired: 11/10/2021 Comorbid Cataracts, Angina, Type II Diabetes, Gout, Osteoarthritis, Weeks Of Treatment: 12 History: Neuropathy Clustered Wound: No Photos Wound Measurements Length: (cm) 0.5 Width: (cm) 1 Depth: (cm) 0.1 Area: (cm) 0.393 Volume: (cm) 0.039 % Reduction in Area: 49.9% % Reduction in Volume: 50.6% Epithelialization: None Tunneling: No Undermining: No Wound Description Classification: Grade 2 Wound Margin: Distinct, outline attached Exudate Amount: Medium Exudate Type: Serosanguineous Exudate Color: red, brown Foul Odor After Cleansing: No Slough/Fibrino Yes Wound Bed Granulation Amount: None Present (0%) Exposed Structure Necrotic Amount: Large (67-100%) Fascia Exposed: No Necrotic Quality: Eschar, Adherent Slough Fat Layer (Subcutaneous Tissue) Exposed: Yes Tendon Exposed: No Muscle Exposed: No Joint Exposed: No Bone Exposed: Yes Periwound Skin Texture Texture Color No Abnormalities Noted: No No Abnormalities Noted: No Callus: No Atrophie Blanche:  No Crepitus: No Cyanosis: No Excoriation: No Ecchymosis: No Induration: No Erythema: No Rash: No Hemosiderin Staining: No Scarring: No Mottled: No Pallor:  No Moisture Rubor: No No Abnormalities Noted: No Dry / Scaly: No Maceration: No Treatment Notes Wound #2 (Toe Great) Wound Laterality: Right Cleanser Soap and Water Discharge Instruction: May shower and wash wound with dial antibacterial soap and water prior to dressing change. Peri-Wound Care Skin Prep Discharge Instruction: Use skin prep as directed [name], [name] ([id number]) [phone].pdf Page 6 of 9 Topical Primary Dressing Santyl Ointment Discharge Instruction: Apply nickel thick amount to wound bed as instructed Hydrofera Blue Ready Foam, 2.5 x2.5 in Discharge Instruction: Apply to wound bed as instructed Secondary Dressing Woven Gauze Sponges 2x2 in Discharge Instruction: Apply over primary dressing as directed. Secured With 41M Medipore H Soft Cloth Surgical T ape, 4 x 10 (in/yd) Discharge Instruction: Secure with tape as directed. Compression Wrap Compression Stockings Add-Ons Cotton Tip Swabs Electronic Signature(s) Signed: 02/11/2022 4:14:43 PM By: [name] Entered By: [name] on 02/10/2022 11:15:04 -------------------------------------------------------------------------------- Wound Assessment Details Patient Name: Date of Service: Kenneth Mons MES Hood. 02/10/2022 10:45 A M Medical Record Number: [id number] Patient Account Number: [id number] Date of Birth/Sex: Treating RN: 1935/07/28 (87 y.o. M) Primary Care Faiga Stones: [name], [name] Other Clinician: Referring Jonice Cerra: Treating Chery Giusto/Extender: [name], [name] Weeks in Treatment: 24 Wound Status Wound Number: 3 Primary Etiology: Diabetic Wound/Ulcer of the Lower Extremity Wound Location: Right, Anterior Ankle Secondary Pressure Ulcer Etiology: Wounding Event: Shear/Friction Wound Status: Open Date Acquired: 12/04/2021 Comorbid Cataracts, Angina, Type II Diabetes, Gout, Osteoarthritis, Weeks Of Treatment: 9 History: Neuropathy Clustered Wound:  No Photos Wound Measurements Length: (cm) 1.1 Width: (cm) 0.9 Depth: (cm) 0.2 Area: (cm) 0.778 Volume: (cm) 0.156 % Reduction in Area: 27.2% % Reduction in Volume: -45.8% Epithelialization: None Tunneling: No Undermining: No Wound Description [name], [name] ([id number]) Classification: Grade 2 Wound Margin: Distinct, outline attached Exudate Amount: None Present [id number] Page 7 of 9 Foul Odor After Cleansing: No Slough/Fibrino No Wound Bed Granulation Amount: Small (1-33%) Exposed Structure Necrotic Amount: Large (67-100%) Fascia Exposed: No Necrotic Quality: Adherent Slough Fat Layer (Subcutaneous Tissue) Exposed: No Tendon Exposed: No Muscle Exposed: No Joint Exposed: No Bone Exposed: No Periwound Skin Texture Texture Color No Abnormalities Noted: No No Abnormalities Noted: No Callus: No Atrophie Blanche: No Crepitus: No Cyanosis: No Excoriation: No Ecchymosis: No Induration: No Erythema: No Rash: No Hemosiderin Staining: No Scarring: No Mottled: No Pallor: No Moisture Rubor: No No Abnormalities Noted: No Dry / Scaly: No Maceration: No Treatment Notes Wound #3 (Ankle) Wound Laterality: Right, Anterior Cleanser Soap and Water Discharge Instruction: May shower and wash wound with dial antibacterial soap and water prior to dressing change. Peri-Wound Care Skin Prep Discharge Instruction: Use skin prep as directed Topical Primary Dressing Santyl Ointment Discharge Instruction: Apply nickel thick amount to wound bed as instructed Hydrofera Blue Ready Foam, 2.5 x2.5 in Discharge Instruction: Apply to wound bed as instructed Secondary Dressing Woven Gauze Sponges 2x2 in Discharge Instruction: Apply over primary dressing as directed. Secured With 41M Medipore H Soft Cloth Surgical T ape, 4 x 10 (in/yd) Discharge Instruction: Secure with tape as directed. Compression Wrap Compression Stockings Add-Ons Cotton Tip  Swabs Electronic Signature(s) Signed: 02/11/2022 4:14:43 PM By: [name] Entered By: [name] on 02/10/2022 11:15:38 [name] ([id number]) [id number] Page 8 of 9 -------------------------------------------------------------------------------- Wound Assessment Details Patient Name: Date of Service: [name], Kenneth MES Hood. 02/10/2022 10:45 A M Medical Record Number: [id number] Patient  Account Number: [id number] Date of Birth/Sex: Treating RN: [birth year] (87 y.o. M) Primary Care Nohemi Nicklaus: [name], [name] Other Clinician: Referring Austen Wygant: Treating Adriann Thau/Extender: [name], [name] Weeks in Treatment: 24 Wound Status Wound Number: 4 Primary Diabetic Wound/Ulcer of the Lower Extremity Etiology: Wound Location: Left, Anterior Ankle Wound Status: Open Wounding Event: Gradually Appeared Comorbid Cataracts, Angina, Type II Diabetes, Gout, Osteoarthritis, Date Acquired: 01/02/2022 History: Neuropathy Weeks Of Treatment: 4 Clustered Wound: No Photos Wound Measurements Length: (cm) 4.5 Width: (cm) 2 Depth: (cm) 0.1 Area: (cm) 7.069 Volume: (cm) 0.707 % Reduction in Area: -226.1% % Reduction in Volume: -62.9% Epithelialization: None Tunneling: No Undermining: No Wound Description Classification: Grade 1 Wound Margin: Distinct, outline attached Exudate Amount: Medium Exudate Type: Serosanguineous Exudate Color: red, brown Foul Odor After Cleansing: No Slough/Fibrino Yes Wound Bed Granulation Amount: Medium (34-66%) Exposed Structure Necrotic Amount: Medium (34-66%) Fascia Exposed: No Necrotic Quality: Eschar Fat Layer (Subcutaneous Tissue) Exposed: No Tendon Exposed: No Muscle Exposed: No Joint Exposed: No Bone Exposed: No Periwound Skin Texture Texture Color No Abnormalities Noted: No No Abnormalities Noted: No Callus: No Atrophie Blanche: No Crepitus: No Cyanosis: No Excoriation: No Ecchymosis: No Induration:  No Erythema: No Rash: No Hemosiderin Staining: No Scarring: No Mottled: No Pallor: No Moisture Rubor: No No Abnormalities Noted: No Dry / Scaly: No Temperature / Pain Maceration: Yes Temperature: No Abnormality Treatment Notes Wound #4 (Ankle) Wound Laterality: Left, Anterior Cleanser [name], [name] ([id number]) [id number] Page 9 of 9 Soap and Water Discharge Instruction: May shower and wash wound with dial antibacterial soap and water prior to dressing change. Peri-Wound Care Skin Prep Discharge Instruction: Use skin prep as directed Topical Primary Dressing Santyl Ointment Discharge Instruction: Apply nickel thick amount to wound bed as instructed Hydrofera Blue Ready Foam, 2.5 x2.5 in Discharge Instruction: Apply to wound bed as instructed Secondary Dressing Woven Gauze Sponges 2x2 in Discharge Instruction: Apply over primary dressing as directed. Secured With 45M Medipore H Soft Cloth Surgical T ape, 4 x 10 (in/yd) Discharge Instruction: Secure with tape as directed. Compression Wrap Compression Stockings Add-Ons Cotton Tip Swabs Electronic Signature(s) Signed: 02/11/2022 4:14:43 PM By: [name] Entered By: [name] on 02/10/2022 11:17:07 -------------------------------------------------------------------------------- Vitals Details Patient Name: Date of Service: Kenneth Mons MES Hood. 02/10/2022 10:45 A M Medical Record Number: [id number] Patient Account Number: [id number] Date of Birth/Sex: Treating RN: [birth year] (87 y.o. M) Primary Care Satya Buttram: [name], [name] Other Clinician: Referring Nasiir Monts: Treating Elmor Kost/Extender: [name], [name] Weeks in Treatment: 24 Vital Signs Time Taken: 11:05 Temperature (F): 98 Height (in): 68 Pulse (bpm): 88 Weight (lbs): 198 Respiratory Rate (breaths/min): 18 Body Mass Index (BMI): 30.1 Blood Pressure (mmHg): 136/70 Reference Range: 80 - 120 mg / dl Electronic  Signature(s) Signed: 02/11/2022 4:14:43 PM By: [name] Entered By: [name] on 02/10/2022 11:05:26

## 2022-02-24 ENCOUNTER — Encounter (HOSPITAL_BASED_OUTPATIENT_CLINIC_OR_DEPARTMENT_OTHER): Payer: Medicare Other | Admitting: Internal Medicine

## 2022-02-24 DIAGNOSIS — S81802A Unspecified open wound, left lower leg, initial encounter: Secondary | ICD-10-CM | POA: Diagnosis not present

## 2022-02-24 DIAGNOSIS — E11621 Type 2 diabetes mellitus with foot ulcer: Secondary | ICD-10-CM

## 2022-02-24 DIAGNOSIS — I739 Peripheral vascular disease, unspecified: Secondary | ICD-10-CM | POA: Diagnosis not present

## 2022-02-24 DIAGNOSIS — M1990 Unspecified osteoarthritis, unspecified site: Secondary | ICD-10-CM | POA: Diagnosis not present

## 2022-02-24 DIAGNOSIS — L97918 Non-pressure chronic ulcer of unspecified part of right lower leg with other specified severity: Secondary | ICD-10-CM | POA: Diagnosis not present

## 2022-02-24 DIAGNOSIS — M86171 Other acute osteomyelitis, right ankle and foot: Secondary | ICD-10-CM | POA: Diagnosis not present

## 2022-02-24 DIAGNOSIS — Z87891 Personal history of nicotine dependence: Secondary | ICD-10-CM | POA: Diagnosis not present

## 2022-02-24 DIAGNOSIS — E1151 Type 2 diabetes mellitus with diabetic peripheral angiopathy without gangrene: Secondary | ICD-10-CM | POA: Diagnosis not present

## 2022-02-24 DIAGNOSIS — L97514 Non-pressure chronic ulcer of other part of right foot with necrosis of bone: Secondary | ICD-10-CM | POA: Diagnosis not present

## 2022-02-24 DIAGNOSIS — I4891 Unspecified atrial fibrillation: Secondary | ICD-10-CM | POA: Diagnosis not present

## 2022-02-24 DIAGNOSIS — E1140 Type 2 diabetes mellitus with diabetic neuropathy, unspecified: Secondary | ICD-10-CM | POA: Diagnosis not present

## 2022-02-24 DIAGNOSIS — M109 Gout, unspecified: Secondary | ICD-10-CM | POA: Diagnosis not present

## 2022-03-03 NOTE — Progress Notes (Signed)
GOODMAN, TOENNIES ([id number]) [id number] Page 1 of 9 Visit Report for 02/24/2022 Chief Complaint Document Details Patient Name: Date of Service: [name] Hood, [name] Kenneth L. 02/24/2022 10:30 A M Medical Record Number: [id number] Patient Account Number: [id number] Date of Birth/Sex: Treating RN: [birth year]/[name] (87 y.o. M) Primary Care Provider: Dorthy Cooler, Dibas Other Clinician: Referring Provider: Treating Provider/Extender: Alanda Slim, Dibas Weeks in Treatment: 26 Information Obtained from: Patient Chief Complaint right foot wound, Right leg wound and left leg wound Electronic Signature(s) Signed: 02/24/2022 12:37:22 PM By: Kalman Shan DO Entered By: Kalman Shan on 02/24/2022 11:35:00 -------------------------------------------------------------------------------- Debridement Details Patient Name: Date of Service: [name] [name] Kenneth L. 02/24/2022 10:30 A M Medical Record Number: [id number] Patient Account Number: [id number] Date of Birth/Sex: Treating RN: September [birth year], [name] (87 y.o. [name], Lauren Primary Care Provider: Dorthy Cooler, Dibas Other Clinician: Referring Provider: Treating Provider/Extender: Alanda Slim, Dibas Weeks in Treatment: 26 Debridement Performed for Assessment: Wound #4 Left,Anterior Ankle Performed By: Physician Kalman Shan, DO Debridement Type: Debridement Severity of Tissue Pre Debridement: Fat layer exposed Level of Consciousness (Pre-procedure): Awake and Alert Pre-procedure Verification/Time Out Yes - 11:26 Taken: Start Time: 11:26 Pain Control: Lidocaine T Area Debrided (L x W): otal 5.2 (cm) x 2.7 (cm) = 14.04 (cm) Tissue and other material debrided: Viable, Non-Viable, Slough, Slough Level: Non-Viable Tissue Debridement Description: Selective/Open Wound Instrument: Curette Bleeding: Minimum Hemostasis Achieved: Pressure End Time: 11:26 Procedural Pain: 0 Post Procedural Pain: 0 Response to Treatment:  Procedure was tolerated well Level of Consciousness (Post- Awake and Alert procedure): Post Debridement Measurements of Total Wound Length: (cm) 5.2 Width: (cm) 2.7 Depth: (cm) 0.2 Volume: (cm) 2.205 Character of Wound/Ulcer Post Debridement: Improved Severity of Tissue Post Debridement: Fat layer exposed Post Procedure Diagnosis Same as Pre-procedure Electronic Signature(s) Signed: 02/24/2022 12:37:22 PM By: Kalman Shan DO Signed: 03/02/2022 3:50:45 PM By: Rhae Hammock RN Entered By: Rhae Hammock on 02/24/2022 11:27:00 [name] Hood ([id number]) [id number] Page 2 of 9 -------------------------------------------------------------------------------- HPI Details Patient Name: Date of Service: [name] Hood Kenneth L. 02/24/2022 10:30 A M Medical Record Number: [id number] Patient Account Number: [id number] Date of Birth/Sex: Treating RN: [name][birth year] (87 y.o. M) Primary Care Provider: Dorthy Cooler, Dibas Other Clinician: Referring Provider: Treating Provider/Extender: Alanda Slim, Dibas Weeks in Treatment: 26 History of Present Illness HPI Description: Admission 08/26/2021 [name] Hood is an 87 year old male with a past medical history of controlled type 2 diabetes on oral agents with peripheral neuropathy and Atrial fibrillation not on blood thinners that presents to the clinic with a right foot wound that has waxed and waned in healing over the past year. He has peripheral neuropathy and is not sure when the wound started or how it started. He had an MRI done by his primary care physician on 08/03/2021 that showed potential early acute osteomyelitis at the posterior calcaneus. He was seen by Dr. Linus Salmons on 08/06/2021 and started on doxycycline and cefadroxil due to these findings. He was evaluated by podiatry, Dr. Jacqualyn Posey and had a bone biopsy done on 08/13/2021 that did not show evidence of osteomyelitis. He had a culture that showed MRSA sensitive  to doxycycline. He also had ABIs with TBI's done on 8/2 that showed an ABI of 0.58 and a noncompressible TBI. He is scheduled to see vein and vascular on 8/30. Currently he is keeping the wound covered. He is using a soft surgical shoe. He denies signs of infection. 8/5; patient presents for follow-up. He had follow-up with Dr. Linus Salmons On 8/23. Plan is for 6 weeks  of doxycycline through September 16. Although path was negative for osteo there is still concern due to positive culture from the bone. Unfortunately patient missed his vein and vascular appointment to assess blood flow status. This has been rescheduled for 9/8. Patient has been using Medihoney to the wound bed. He tries to offload the wound bed but has trouble at night keeping pressure off of it. He does not have Prevalon boots. He currently denies signs of infection. 9/19; patient presents for follow-up. He is scheduled to have a bilateral lower extremity angiogram on 9/20. He currently denies signs of infection. He has been using Medihoney to the wound bed. 11/13; patient has missed his last follow-up. In fact he has not been here in 2 months. He had bilateral iliofemoral endarterectomy with retrograde iliac stenting on 10/09/2021. He has been using Medihoney with Hydrofera Blue to the heel wound. He reports improvement in wound healing here. 2 weeks ago he developed a wound to the dorsal aspect of the great toe. He states His shoes cause this issue and he is currently wearing different shoes. He is on clindamycin as there was concern for infection to the right great toe. He currently denies signs of infection. He has been keeping the great toe open to air. 12/4; patient presents for follow-up. He has been using Medihoney and Hydrofera Blue to the heel wound and right great toe wound. The heel wound is closed. The right great toe wound has bone exposed. He now has a new wound to the right ankle that he states he used to shoehorn to scratch the  area and created the wound. He has not been dressing this area. 12/21; patient presents for follow-up. He has been using Hydrofera Blue and Medihoney to the right anterior leg wound and right toe wound. There is been improvement in wound healing. He has been taking doxycycline prescribed at last clinic visit. He has no issues or complaints today. 1/5; patient presents for follow-up. He has been using Hydrofera Blue and Medihoney to the right anterior leg wound and right toe wound. Unfortunately has developed a new wound to the left lower extremity. He is not sure how this started. He has completed his course of doxycycline. He denies signs of infection. He is scheduled to see vein and vascular for follow-up on 1/12. 1/18; patient presents for follow-up. He saw vein and vascular on 1/12 for evaluation of a new wound to his left lower extremity. They recommended he continue to elevate his legs. They stated that if the wound became worse or did not improve he will need repeat angiogram. Currently patient denies signs of infection. He has been using Medihoney and Hydrofera Blue to the wound beds. 2/6; patient missed his last clinic appointment. He has been using Medihoney and Hydrofera Blue to the wound beds. He saw vein and vascular on 2/2. No plan for angiography at this time. Patient has no issues or complaints today. 2/20; patient presents for follow-up. He has been using Santyl and Hydrofera Blue to the wound beds however he admits to not dressing the wound beds consistently and leaving them open to air. He denies signs of infection. Electronic Signature(s) Signed: 02/24/2022 12:37:22 PM By: Kalman Shan DO Entered By: Kalman Shan on 02/24/2022 11:35:43 -------------------------------------------------------------------------------- Physical Exam Details Patient Name: Date of Service: [name] [name] Kenneth L. 02/24/2022 10:30 A M Medical Record Number: [id number] Patient Account Number:  [id number] Date of Birth/Sex: Treating RN: [name] Hood, [name] (87 y.o. M) Primary Care Provider: Dorthy Cooler, Dibas Other  Clinician: Referring Provider: Treating Provider/Extender: Alanda Slim, Dibas Weeks in Treatment: 26 Constitutional respirations regular, non-labored and within target range for patient.. Cardiovascular 2+ dorsalis pedis/posterior tibialis pulses. DOUGLAS, RACZ ([id number]) [id number] Page 3 of 9 Psychiatric pleasant and cooperative. Notes T the dorsal aspect of the right great toe there is a small scab. No bone exposed. T the right anterior ankle crease there is an open wound with granulation o o tissue and Nonviable tissue. No signs of infection. Wound to the left anterior leg with Mostly non viable tissue. No signs of infection to any of the wound beds. Electronic Signature(s) Signed: 02/24/2022 12:37:22 PM By: Kalman Shan DO Entered By: Kalman Shan on 02/24/2022 11:36:42 -------------------------------------------------------------------------------- Physician Orders Details Patient Name: Date of Service: [name] [name] Kenneth L. 02/24/2022 10:30 A M Medical Record Number: [id number] Patient Account Number: [id number] Date of Birth/Sex: Treating RN: [name] (87 y.o. [name] Primary Care Provider: Dorthy Cooler, Dibas Other Clinician: Referring Provider: Treating Provider/Extender: Alanda Slim, Dibas Weeks in Treatment: 62 Verbal / Phone Orders: No Diagnosis Coding Follow-up Appointments Return appointment in 1 month. - w/ Dr. Heber [place] any day Other: - F/U with Vein and Vascular if wounds get worse. pick up santyl from pharmacy. Anesthetic (In clinic) Topical Lidocaine 5% applied to wound bed (In clinic) Topical Lidocaine 4% applied to wound bed Bathing/ Shower/ Hygiene May shower and wash wound with soap and water. Edema Control - Lymphedema / SCD / Other Elevate legs to the level of the heart or  above for 30 minutes daily and/or when sitting for 3-4 times a day throughout the day. Avoid standing for long periods of time. Moisturize legs daily. Off-Loading Open toe surgical shoe to: - Wear socks with your shoes Other: - -Keep pressure off area as much as is possible wear while resting in bed or chair- Prevalon Boot do not walk or stand with it. Additional Orders / Instructions Follow Nutritious Diet Wound Treatment Wound #3 - Ankle Wound Laterality: Right, Anterior Cleanser: Soap and Water 1 x Per Day/7 Days Discharge Instructions: May shower and wash wound with dial antibacterial soap and water prior to dressing change. Peri-Wound Care: Skin Prep (Generic) 1 x Per Day/7 Days Discharge Instructions: Use skin prep as directed Prim Dressing: Santyl Ointment 1 x Per Day/7 Days ary Discharge Instructions: Apply nickel thick amount to wound bed as instructed Prim Dressing: Hydrofera Blue Ready Foam, 2.5 x2.5 in (Generic) 1 x Per Day/7 Days ary Discharge Instructions: Apply to wound bed as instructed Secondary Dressing: Woven Gauze Sponges 2x2 in 1 x Per Day/7 Days Discharge Instructions: Apply over primary dressing as directed. Secured With: 21M Medipore H Soft Cloth Surgical T ape, 4 x 10 (in/yd) 1 x Per Day/7 Days Discharge Instructions: Secure with tape as directed. Add-Ons: Cotton Tip Swabs (Generic) 1 x Per Day/7 Days Wound #4 - Ankle Wound Laterality: Left, Anterior Cleanser: Soap and Water 1 x Per Day/7 Days CALLAWAY, KUROWSKI ([id number]) [phone].pdf Page 4 of 9 Discharge Instructions: May shower and wash wound with dial antibacterial soap and water prior to dressing change. Peri-Wound Care: Skin Prep (Generic) 1 x Per Day/7 Days Discharge Instructions: Use skin prep as directed Prim Dressing: Santyl Ointment 1 x Per Day/7 Days ary Discharge Instructions: Apply nickel thick amount to wound bed as instructed Prim Dressing: Hydrofera Blue Ready Foam,  2.5 x2.5 in (Generic) 1 x Per Day/7 Days ary Discharge Instructions: Apply to wound bed as instructed Secondary Dressing: Woven Gauze Sponges 2x2 in  1 x Per Day/7 Days Discharge Instructions: Apply over primary dressing as directed. Secured With: 26M Medipore H Soft Cloth Surgical T ape, 4 x 10 (in/yd) 1 x Per Day/7 Days Discharge Instructions: Secure with tape as directed. Add-Ons: Cotton Tip Swabs (Generic) 1 x Per Day/7 Days Electronic Signature(s) Signed: 02/24/2022 12:37:22 PM By: Kalman Shan DO Entered By: Kalman Shan on 02/24/2022 11:36:51 -------------------------------------------------------------------------------- Problem List Details Patient Name: Date of Service: [name] [name] Kenneth L. 02/24/2022 10:30 A M Medical Record Number: [id number] Patient Account Number: [id number] Date of Birth/Sex: Treating RN: September [birth year], [name] (87 y.o. M) Primary Care Provider: Dorthy Cooler, Dibas Other Clinician: Referring Provider: Treating Provider/Extender: Alanda Slim, Dibas Weeks in Treatment: 26 Active Problems ICD-10 Encounter Code Description Active Date MDM Diagnosis E11.621 Type 2 diabetes mellitus with foot ulcer 08/26/2021 No Yes I73.9 Peripheral vascular disease, unspecified 08/26/2021 No Yes M86.171 Other acute osteomyelitis, right ankle and foot 08/26/2021 No Yes E11.40 Type 2 diabetes mellitus with diabetic neuropathy, unspecified 08/26/2021 No Yes L97.514 Non-pressure chronic ulcer of other part of right foot with necrosis of bone 12/08/2021 No Yes L97.918 Non-pressure chronic ulcer of unspecified part of right lower leg with other 12/25/2021 No Yes specified severity S81.802A Unspecified open wound, left lower leg, initial encounter 01/09/2022 No Yes Inactive Problems Resolved Problems ADAEL, LINDAU ([id number]) [id number] Page 5 of 9 Electronic Signature(s) Signed: 02/24/2022 12:37:22 PM By: Kalman Shan DO Entered By: Kalman Shan on  02/24/2022 11:34:46 -------------------------------------------------------------------------------- Progress Note Details Patient Name: Date of Service: [name] [name] Kenneth L. 02/24/2022 10:30 A M Medical Record Number: [id number] Patient Account Number: [id number] Date of Birth/Sex: Treating RN: [name][birth year] (87 y.o. M) Primary Care Provider: Dorthy Cooler, Dibas Other Clinician: Referring Provider: Treating Provider/Extender: Alanda Slim, Dibas Weeks in Treatment: 26 Subjective Chief Complaint Information obtained from Patient right foot wound, Right leg wound and left leg wound History of Present Illness (HPI) Admission 08/26/2021 Mr. Taiquan Fronda is an 87 year old male with a past medical history of controlled type 2 diabetes on oral agents with peripheral neuropathy and Atrial fibrillation not on blood thinners that presents to the clinic with a right foot wound that has waxed and waned in healing over the past year. He has peripheral neuropathy and is not sure when the wound started or how it started. He had an MRI done by his primary care physician on 08/03/2021 that showed potential early acute osteomyelitis at the posterior calcaneus. He was seen by Dr. Linus Salmons on 08/06/2021 and started on doxycycline and cefadroxil due to these findings. He was evaluated by podiatry, Dr. Jacqualyn Posey and had a bone biopsy done on 08/13/2021 that did not show evidence of osteomyelitis. He had a culture that showed MRSA sensitive to doxycycline. He also had ABIs with TBI's done on 8/2 that showed an ABI of 0.58 and a noncompressible TBI. He is scheduled to see vein and vascular on 8/30. Currently he is keeping the wound covered. He is using a soft surgical shoe. He denies signs of infection. 8/5; patient presents for follow-up. He had follow-up with Dr. Linus Salmons On 8/23. Plan is for 6 weeks of doxycycline through September 16. Although path was negative for osteo there is still concern due to positive culture from the  bone. Unfortunately patient missed his vein and vascular appointment to assess blood flow status. This has been rescheduled for 9/8. Patient has been using Medihoney to the wound bed. He tries to offload the wound bed but has trouble at night keeping pressure off of it. He  does not have Prevalon boots. He currently denies signs of infection. 9/19; patient presents for follow-up. He is scheduled to have a bilateral lower extremity angiogram on 9/20. He currently denies signs of infection. He has been using Medihoney to the wound bed. 11/13; patient has missed his last follow-up. In fact he has not been here in 2 months. He had bilateral iliofemoral endarterectomy with retrograde iliac stenting on 10/09/2021. He has been using Medihoney with Hydrofera Blue to the heel wound. He reports improvement in wound healing here. 2 weeks ago he developed a wound to the dorsal aspect of the great toe. He states His shoes cause this issue and he is currently wearing different shoes. He is on clindamycin as there was concern for infection to the right great toe. He currently denies signs of infection. He has been keeping the great toe open to air. 12/4; patient presents for follow-up. He has been using Medihoney and Hydrofera Blue to the heel wound and right great toe wound. The heel wound is closed. The right great toe wound has bone exposed. He now has a new wound to the right ankle that he states he used to shoehorn to scratch the area and created the wound. He has not been dressing this area. 12/21; patient presents for follow-up. He has been using Hydrofera Blue and Medihoney to the right anterior leg wound and right toe wound. There is been improvement in wound healing. He has been taking doxycycline prescribed at last clinic visit. He has no issues or complaints today. 1/5; patient presents for follow-up. He has been using Hydrofera Blue and Medihoney to the right anterior leg wound and right toe wound.  Unfortunately has developed a new wound to the left lower extremity. He is not sure how this started. He has completed his course of doxycycline. He denies signs of infection. He is scheduled to see vein and vascular for follow-up on 1/12. 1/18; patient presents for follow-up. He saw vein and vascular on 1/12 for evaluation of a new wound to his left lower extremity. They recommended he continue to elevate his legs. They stated that if the wound became worse or did not improve he will need repeat angiogram. Currently patient denies signs of infection. He has been using Medihoney and Hydrofera Blue to the wound beds. 2/6; patient missed his last clinic appointment. He has been using Medihoney and Hydrofera Blue to the wound beds. He saw vein and vascular on 2/2. No plan for angiography at this time. Patient has no issues or complaints today. 2/20; patient presents for follow-up. He has been using Santyl and Hydrofera Blue to the wound beds however he admits to not dressing the wound beds consistently and leaving them open to air. He denies signs of infection. Patient History Information obtained from Patient, Chart. Family History Diabetes - Child, Stroke - Mother, No family history of Cancer, Heart Disease, Hereditary Spherocytosis, Hypertension, Kidney Disease, Lung Disease, Seizures, Thyroid Problems, Tuberculosis. Social History Former smoker, Marital Status - Divorced, Alcohol Use - Moderate, Drug Use - Prior History, Caffeine Use - Daily. Medical History Eyes Patient has history of Cataracts - Surgery Cardiovascular Patient has history of Angina - AFib Endocrine LYNKEN, MECKLEY ([id number]) [id number] Page 6 of 9 Patient has history of Type II Diabetes Musculoskeletal Patient has history of Gout, Osteoarthritis Denies history of Rheumatoid Arthritis, Osteomyelitis Neurologic Patient has history of Neuropathy Hospitalization/Surgery History - inpatient  09/2021 arterial stenting on legs.. Medical A Surgical History Notes nd Cardiovascular Hx  CVA Objective Constitutional respirations regular, non-labored and within target range for patient.. Vitals Time Taken: 10:57 AM, Height: 68 in, Weight: 198 lbs, BMI: 30.1, Temperature: 98.1 F, Pulse: 74 bpm, Respiratory Rate: 17 breaths/min, Blood Pressure: 121/79 mmHg. Cardiovascular 2+ dorsalis pedis/posterior tibialis pulses. Psychiatric pleasant and cooperative. General Notes: T the dorsal aspect of the right great toe there is a small scab. No bone exposed. T the right anterior ankle crease there is an open wound with o o granulation tissue and Nonviable tissue. No signs of infection. Wound to the left anterior leg with Mostly non viable tissue. No signs of infection to any of the wound beds. Integumentary (Hair, Skin) Wound #2 status is Healed - Epithelialized. Original cause of wound was Trauma. The date acquired was: 11/10/2021. The wound has been in treatment 14 weeks. The wound is located on the Right T Great. The wound measures 0cm length x 0cm width x 0cm depth; 0cm^2 area and 0cm^3 volume. There is bone and Fat oe Layer (Subcutaneous Tissue) exposed. There is no tunneling or undermining noted. There is a medium amount of serosanguineous drainage noted. The wound margin is distinct with the outline attached to the wound base. There is no granulation within the wound bed. There is a large (67-100%) amount of necrotic tissue within the wound bed including Eschar. The periwound skin appearance did not exhibit: Callus, Crepitus, Excoriation, Induration, Rash, Scarring, Dry/Scaly, Maceration, Atrophie Blanche, Cyanosis, Ecchymosis, Hemosiderin Staining, Mottled, Pallor, Rubor, Erythema. Wound #3 status is Open. Original cause of wound was Shear/Friction. The date acquired was: 12/04/2021. The wound has been in treatment 11 weeks. The wound is located on the Right,Anterior Ankle. The wound  measures 1cm length x 1cm width x 0.2cm depth; 0.785cm^2 area and 0.157cm^3 volume. There is no tunneling or undermining noted. There is a none present amount of drainage noted. The wound margin is distinct with the outline attached to the wound base. There is small (1-33%) granulation within the wound bed. There is a large (67-100%) amount of necrotic tissue within the wound bed including Adherent Slough. The periwound skin appearance did not exhibit: Callus, Crepitus, Excoriation, Induration, Rash, Scarring, Dry/Scaly, Maceration, Atrophie Blanche, Cyanosis, Ecchymosis, Hemosiderin Staining, Mottled, Pallor, Rubor, Erythema. Wound #4 status is Open. Original cause of wound was Gradually Appeared. The date acquired was: 12/Hood/2023. The wound has been in treatment 6 weeks. The wound is located on the Left,Anterior Ankle. The wound measures 5.2cm length x 2.7cm width x 0.2cm depth; 11.027cm^2 area and 2.205cm^3 volume. There is no tunneling or undermining noted. There is a medium amount of serosanguineous drainage noted. The wound margin is distinct with the outline attached to the wound base. There is medium (34-66%) granulation within the wound bed. There is a medium (34-66%) amount of necrotic tissue within the wound bed including Eschar. The periwound skin appearance exhibited: Maceration, Erythema. The periwound skin appearance did not exhibit: Callus, Crepitus, Excoriation, Induration, Rash, Scarring, Dry/Scaly, Atrophie Blanche, Cyanosis, Ecchymosis, Hemosiderin Staining, Mottled, Pallor, Rubor. The surrounding wound skin color is noted with erythema. Periwound temperature was noted as No Abnormality. The periwound has tenderness on palpation. Assessment Active Problems ICD-10 Type 2 diabetes mellitus with foot ulcer Peripheral vascular disease, unspecified Other acute osteomyelitis, right ankle and foot Type 2 diabetes mellitus with diabetic neuropathy, unspecified Non-pressure chronic  ulcer of other part of right foot with necrosis of bone Non-pressure chronic ulcer of unspecified part of right lower leg with other specified severity Unspecified open wound, left lower leg, initial  encounter Patient's right lower extremity wounds appear well-healing. The right great toe wound is healed. I recommended continuing Hydrofera Blue and Santyl to the right ankle wound. Unfortunately it appears he has not been dressing the left lower extremity wounds. These are very dry. I debrided the slough that came off easily. I recommended continuing Santyl and Hydrofera Blue here and making sure that the wound is covered consistently. Follow-up in 1 month. LUTHUR, LINDMARK ([id number]) [id number] Page 7 of 9 Procedures Wound #4 Pre-procedure diagnosis of Wound #4 is a Diabetic Wound/Ulcer of the Lower Extremity located on the Left,Anterior Ankle .Severity of Tissue Pre Debridement is: Fat layer exposed. There was a Selective/Open Wound Non-Viable Tissue Debridement with a total area of 14.04 sq cm performed by Kalman Shan, DO. With the following instrument(s): Curette to remove Viable and Non-Viable tissue/material. Material removed includes [place] after achieving pain control using Lidocaine. No specimens were taken. A time out was conducted at 11:26, prior to the start of the procedure. A Minimum amount of bleeding was controlled with Pressure. The procedure was tolerated well with a pain level of 0 throughout and a pain level of 0 following the procedure. Post Debridement Measurements: 5.2cm length x 2.7cm width x 0.2cm depth; 2.205cm^3 volume. Character of Wound/Ulcer Post Debridement is improved. Severity of Tissue Post Debridement is: Fat layer exposed. Post procedure Diagnosis Wound #4: Same as Pre-Procedure Plan Follow-up Appointments: Return appointment in 1 month. - w/ Dr. Heber [place] any day Other: - F/U with [facility] if wounds get worse. pick up  santyl from pharmacy. Anesthetic: (In clinic) Topical Lidocaine 5% applied to wound bed (In clinic) Topical Lidocaine 4% applied to wound bed Bathing/ Shower/ Hygiene: May shower and wash wound with soap and water. Edema Control - Lymphedema / SCD / Other: Elevate legs to the level of the heart or above for 30 minutes daily and/or when sitting for 3-4 times a day throughout the day. Avoid standing for long periods of time. Moisturize legs daily. Off-Loading: Open toe surgical shoe to: - Wear socks with your shoes Other: - -Keep pressure off area as much as is possible wear while resting in bed or chair- Prevalon Boot do not walk or stand with it. Additional Orders / Instructions: Follow Nutritious Diet WOUND #3: - Ankle Wound Laterality: Right, Anterior Cleanser: Soap and Water 1 x Per Day/7 Days Discharge Instructions: May shower and wash wound with dial antibacterial soap and water prior to dressing change. Peri-Wound Care: Skin Prep (Generic) 1 x Per Day/7 Days Discharge Instructions: Use skin prep as directed Prim Dressing: Santyl Ointment 1 x Per Day/7 Days ary Discharge Instructions: Apply nickel thick amount to wound bed as instructed Prim Dressing: Hydrofera Blue Ready Foam, 2.5 x2.5 in (Generic) 1 x Per Day/7 Days ary Discharge Instructions: Apply to wound bed as instructed Secondary Dressing: Woven Gauze Sponges 2x2 in 1 x Per Day/7 Days Discharge Instructions: Apply over primary dressing as directed. Secured With: 57M Medipore H Soft Cloth Surgical T ape, 4 x 10 (in/yd) 1 x Per Day/7 Days Discharge Instructions: Secure with tape as directed. Add-Ons: Cotton Tip Swabs (Generic) 1 x Per Day/7 Days WOUND #4: - Ankle Wound Laterality: Left, Anterior Cleanser: Soap and Water 1 x Per Day/7 Days Discharge Instructions: May shower and wash wound with dial antibacterial soap and water prior to dressing change. Peri-Wound Care: Skin Prep (Generic) 1 x Per Day/7 Days Discharge  Instructions: Use skin prep as directed Prim Dressing: Santyl Ointment 1 x  Per Day/7 Days ary Discharge Instructions: Apply nickel thick amount to wound bed as instructed Prim Dressing: Hydrofera Blue Ready Foam, 2.5 x2.5 in (Generic) 1 x Per Day/7 Days ary Discharge Instructions: Apply to wound bed as instructed Secondary Dressing: Woven Gauze Sponges 2x2 in 1 x Per Day/7 Days Discharge Instructions: Apply over primary dressing as directed. Secured With: 6M Medipore H Soft Cloth Surgical T ape, 4 x 10 (in/yd) 1 x Per Day/7 Days Discharge Instructions: Secure with tape as directed. Add-Ons: Cotton Tip Swabs (Generic) 1 x Per Day/7 Days 1. In office sharp debridement 2. Hydroferra blue and santyl 3. follow up in one month Electronic Signature(s) Signed: 02/24/2022 12:37:22 PM By: Kalman Shan DO Entered By: Kalman Shan on 02/24/2022 11:41:02 -------------------------------------------------------------------------------- HxROS Details Patient Name: Date of Service: [name] [name] Kenneth L. 02/24/2022 10:30 A Radene Journey ([id number]) [phone] Page 8 of 9 Medical Record Number: [id number] Patient Account Number: [id number] Date of Birth/Sex: Treating RN: [name] (87 y.o. M) Primary Care Provider: Dorthy Cooler, [place] Other Clinician: Referring Provider: Treating Provider/Extender: Alanda Slim, [place] Weeks in Treatment: 65 Information Obtained From Patient Chart Eyes Medical History: Positive for: Cataracts - Surgery Cardiovascular Medical History: Positive for: Angina - AFib Past Medical History Notes: Hx CVA Endocrine Medical History: Positive for: Type II Diabetes Treated with: Oral agents, Diet Blood sugar tested every day: No Musculoskeletal Medical History: Positive for: Gout; Osteoarthritis Negative for: Rheumatoid Arthritis; Osteomyelitis Neurologic Medical History: Positive for: Neuropathy HBO Extended History  Items Eyes: Cataracts Immunizations Pneumococcal Vaccine: Received Pneumococcal Vaccination: No Implantable Devices None Hospitalization / Surgery History Type of Hospitalization/Surgery inpatient 09/2021 arterial stenting on legs. Family and Social History Cancer: No; Diabetes: Yes - Child; Heart Disease: No; Hereditary Spherocytosis: No; Hypertension: No; Kidney Disease: No; Lung Disease: No; Seizures: No; Stroke: Yes - Mother; Thyroid Problems: No; Tuberculosis: No; Former smoker; Marital Status - Divorced; Alcohol Use: Moderate; Drug Use: Prior History; Caffeine Use: Daily; Financial Concerns: No; Food, Clothing or Shelter Needs: No; Support System Lacking: No; Transportation Concerns: No Electronic Signature(s) Signed: 02/24/2022 12:37:22 PM By: Kalman Shan DO Entered By: Kalman Shan on 02/24/2022 11:35:48 -------------------------------------------------------------------------------- SuperBill Details Patient Name: Date of Service: [name] [name] Kenneth L. 02/24/2022 Medical Record Number: [id number] Patient Account Number: [id number] Date of Birth/Sex: Treating RN: [birth year] (87 y.o. [name] Primary Care Provider: Dorthy Cooler, [place] Other Clinician: Referring Provider: Treating Provider/Extender: Alanda Slim, [place] Weeks in Treatment: [address] [place] ([id number]) [id number] Page 9 of 9 Diagnosis Coding ICD-10 Codes Code Description E11.621 Type 2 diabetes mellitus with foot ulcer I73.9 Peripheral vascular disease, unspecified M86.171 Other acute osteomyelitis, right ankle and foot E11.40 Type 2 diabetes mellitus with diabetic neuropathy, unspecified L97.514 Non-pressure chronic ulcer of other part of right foot with necrosis of bone L97.918 Non-pressure chronic ulcer of unspecified part of right lower leg with other specified severity S81.802A Unspecified open wound, left lower leg, initial encounter Facility  Procedures : CPT4 Code: [id number] Description: 99214 - WOUND CARE VISIT-LEV 4 EST PT Modifier: Quantity: 1 : CPT4 Code: [id number] Description: [id number] - DEBRIDE WOUND 1ST 20 SQ CM OR < ICD-10 Diagnosis Description S81.802A Unspecified open wound, left lower leg, initial encounter I73.9 Peripheral vascular disease, unspecified E11.621 Type 2 diabetes mellitus with foot ulcer Modifier: Quantity: 1 Physician Procedures : CPT4 Code Description Modifier [id number] - WC PHYS LEVEL 3 - EST PT ICD-10 Diagnosis Description L97.514 Non-pressure chronic ulcer of other part of right foot with necrosis of bone L97.918 Non-pressure chronic  ulcer of unspecified part of right  lower leg with other specified severity E11.621 Type 2 diabetes mellitus with foot ulcer I73.9 Peripheral vascular disease, unspecified Quantity: 1 : [id number] - WC PHYS DEBR WO ANESTH 20 SQ CM ICD-10 Diagnosis Description S81.802A Unspecified open wound, left lower leg, initial encounter I73.9 Peripheral vascular disease, unspecified E11.621 Type 2 diabetes mellitus with foot ulcer Quantity: 1 Electronic Signature(s) Signed: 02/24/2022 12:37:22 PM By: Kalman Shan DO Entered By: Kalman Shan on 02/24/2022 11:41:40

## 2022-03-03 NOTE — Progress Notes (Signed)
JASIER, CICCHINI ([id number]) [id number] Page 1 of 11 Visit Report for 02/24/2022 Arrival Information Details Patient Name: Date of Service: [name], Kenneth MES L. 02/24/2022 10:30 A M Medical Record Number: [id number] Patient Account Number: [id number] Date of Birth/Sex: Treating RN: 03/05/1935 (87 y.o. [name] Primary Care Quinteria Chisum: Dorthy Cooler, Dibas Other Clinician: Referring Soriyah Osberg: Treating Alissah Redmon/Extender: Alanda Slim, Dibas Weeks in Treatment: 26 Visit Information History Since Last Visit Added or deleted any medications: No Patient Arrived: Walker Any new allergies or adverse reactions: No Arrival Time: 10:56 Had a fall or experienced change in No Accompanied By: family activities of daily living that may affect Transfer Assistance: Manual risk of falls: Patient Identification Verified: Yes Signs or symptoms of abuse/neglect since last visito No Secondary Verification Process Completed: Yes Hospitalized since last visit: No Patient Requires Transmission-Based Precautions: No Implantable device outside of the clinic excluding No Patient Has Alerts: Yes cellular tissue based products placed in the center Patient Alerts: Patient on Blood Thinner since last visit: ABI R=.58 TBI R=[place] Has Dressing in Place as Prescribed: Yes Pain Present Now: No Electronic Signature(s) Signed: 03/02/2022 3:50:45 PM By: Rhae Hammock RN Entered By: Rhae Hammock on 02/24/2022 10:56:57 -------------------------------------------------------------------------------- Clinic Level of Care Assessment Details Patient Name: Date of Service: [name], Kenneth MES L. 02/24/2022 10:30 A M Medical Record Number: [id number] Patient Account Number: [id number] Date of Birth/Sex: Treating RN: 15-Jun-1935 (87 y.o. [name], Lauren Primary Care Shaniqwa Horsman: Dorthy Cooler, Dibas Other Clinician: Referring Lurlie Wigen: Treating Tirth Cothron/Extender: Alanda Slim,  Dibas Weeks in Treatment: 26 Clinic Level of Care Assessment Items TOOL 4 Quantity Score X- 1 0 Use when only an EandM is performed on FOLLOW-UP visit ASSESSMENTS - Nursing Assessment / Reassessment X- 1 10 Reassessment of Co-morbidities (includes updates in patient status) X- 1 5 Reassessment of Adherence to Treatment Plan ASSESSMENTS - Wound and Skin A ssessment / Reassessment '[]'$  - 0 Simple Wound Assessment / Reassessment - one wound X- 2 5 Complex Wound Assessment / Reassessment - multiple wounds '[]'$  - 0 Dermatologic / Skin Assessment (not related to wound area) ASSESSMENTS - Focused Assessment X- 1 5 Circumferential Edema Measurements - multi extremities '[]'$  - 0 Nutritional Assessment / Counseling / Intervention '[]'$  - 0 Lower Extremity Assessment (monofilament, tuning fork, pulses) '[]'$  - 0 Peripheral Arterial Disease Assessment (using hand held doppler) ASSESSMENTS - Ostomy and/or Continence Assessment and Care '[]'$  - 0 Incontinence Assessment and Management '[]'$  - 0 Ostomy Care Assessment and Management (repouching, etc.) PROCESS - Coordination of Care '[]'$  - 0 Simple Patient / Family Education for ongoing care JEMEL, HARIS ([id number]) [id number] Page 2 of 11 X- 1 20 Complex (extensive) Patient / Family Education for ongoing care X- 1 10 Staff obtains Programmer, systems, Records, T Results / Process Orders est '[]'$  - 0 Staff telephones HHA, Nursing Homes / Clarify orders / etc '[]'$  - 0 Routine Transfer to another Facility (non-emergent condition) '[]'$  - 0 Routine Hospital Admission (non-emergent condition) '[]'$  - 0 New Admissions / Biomedical engineer / Ordering NPWT Apligraf, etc. , '[]'$  - 0 Emergency Hospital Admission (emergent condition) '[]'$  - 0 Simple Discharge Coordination X- 1 15 Complex (extensive) Discharge Coordination PROCESS - Special Needs '[]'$  - 0 Pediatric / Minor Patient Management '[]'$  - 0 Isolation Patient Management '[]'$  - 0 Hearing /  Language / Visual special needs '[]'$  - 0 Assessment of Community assistance (transportation, D/C planning, etc.) '[]'$  - 0 Additional assistance / Altered mentation '[]'$  - 0 Support Surface(s) Assessment (bed, cushion, seat, etc.) INTERVENTIONS -  Wound Cleansing / Measurement '[]'$  - 0 Simple Wound Cleansing - one wound X- 2 5 Complex Wound Cleansing - multiple wounds X- 1 5 Wound Imaging (photographs - any number of wounds) '[]'$  - 0 Wound Tracing (instead of photographs) '[]'$  - 0 Simple Wound Measurement - one wound X- 2 5 Complex Wound Measurement - multiple wounds INTERVENTIONS - Wound Dressings '[]'$  - 0 Small Wound Dressing one or multiple wounds X- 2 15 Medium Wound Dressing one or multiple wounds '[]'$  - 0 Large Wound Dressing one or multiple wounds X- 1 5 Application of Medications - topical '[]'$  - 0 Application of Medications - injection INTERVENTIONS - Miscellaneous '[]'$  - 0 External ear exam '[]'$  - 0 Specimen Collection (cultures, biopsies, blood, body fluids, etc.) '[]'$  - 0 Specimen(s) / Culture(s) sent or taken to Lab for analysis '[]'$  - 0 Patient Transfer (multiple staff / Civil Service fast streamer / Similar devices) '[]'$  - 0 Simple Staple / Suture removal (25 or less) '[]'$  - 0 Complex Staple / Suture removal (26 or more) '[]'$  - 0 Hypo / Hyperglycemic Management (close monitor of Blood Glucose) '[]'$  - 0 Ankle / Brachial Index (ABI) - do not check if billed separately X- 1 5 Vital Signs Has the patient been seen at the hospital within the last three years: Yes Total Score: 140 Level Of Care: New/Established - Level 4 Electronic Signature(s) Signed: 03/02/2022 3:50:45 PM By: Rhae Hammock RN Entered By: Rhae Hammock on 02/24/2022 11:40:28 Chancy, [name] ([id number].pdf Page 3 of 11 -------------------------------------------------------------------------------- Encounter Discharge Information Details Patient Name: Date of Service: JORIS, ARRUE MES L.  02/24/2022 10:30 A M Medical Record Number: [id number] Patient Account Number: [id number] Date of Birth/Sex: Treating RN: 23-Aug-1935 (87 y.o. [name] Primary Care Gay Moncivais: Dorthy Cooler, Dibas Other Clinician: Referring Minh Jasper: Treating Derek Laughter/Extender: Alanda Slim, Dibas Weeks in Treatment: 85 Encounter Discharge Information Items Post Procedure Vitals Discharge Condition: Stable Temperature (F): 98.7 Ambulatory Status: Walker Pulse (bpm): 74 Discharge Destination: Home Respiratory Rate (breaths/min): 17 Transportation: Private Auto Blood Pressure (mmHg): 120/80 Accompanied By: wife Schedule Follow-up Appointment: Yes Clinical Summary of Care: Patient Declined Electronic Signature(s) Signed: 03/02/2022 3:50:45 PM By: Rhae Hammock RN Entered By: Rhae Hammock on 02/24/2022 11:47:03 -------------------------------------------------------------------------------- Lower Extremity Assessment Details Patient Name: Date of Service: Kenneth Mons MES L. 02/24/2022 10:30 A M Medical Record Number: [id number] Patient Account Number: [id number] Date of Birth/Sex: Treating RN: 04/17/1935 (87 y.o. [name] Primary Care Lakyia Behe: Dorthy Cooler, Dibas Other Clinician: Referring Kendyll Huettner: Treating Amaro Mangold/Extender: Alanda Slim, Dibas Weeks in Treatment: 26 Edema Assessment Assessed: [Left: Yes] [Right: Yes] Edema: [Left: Yes] [Right: Yes] Calf Left: Right: Point of Measurement: 42 cm From Medial Instep 37 cm 38 cm Ankle Left: Right: Point of Measurement: 8 cm From Medial Instep 22 cm 23.5 cm Vascular Assessment Pulses: Dorsalis Pedis Palpable: [Left:Yes] [Right:Yes] Posterior Tibial Palpable: [Left:Yes] [Right:Yes] Electronic Signature(s) Signed: 03/02/2022 3:50:45 PM By: Rhae Hammock RN Entered By: Rhae Hammock on 02/24/2022 11:00:43 -------------------------------------------------------------------------------- Multi Wound  Chart Details Patient Name: Date of Service: Kenneth Mons MES L. 02/24/2022 10:30 A M Medical Record Number: [id number] Patient Account Number: [id number] Date of Birth/Sex: Treating RN: [birth year] (87 y.o. M) Primary Care Lior Cartelli: Dorthy Cooler, Dibas Other Clinician: KAILER, ZIPAY ([id number]) [id number] Page 4 of 11 Referring Lexington Krotz: Treating Jasamine Pottinger/Extender: Alanda Slim, Dibas Weeks in Treatment: 26 Vital Signs Height(in): 68 Pulse(bpm): 74 Weight(lbs): 198 Blood Pressure(mmHg): 121/79 Body Mass Index(BMI): 30.1 Temperature(F): 98.1 Respiratory Rate(breaths/min): 17 [2:Photos:] Right T Great oe Right, Anterior Ankle Left,  Anterior Ankle Wound Location: Trauma Shear/Friction Gradually Appeared Wounding Event: Diabetic Wound/Ulcer of the Lower Diabetic Wound/Ulcer of the Lower Diabetic Wound/Ulcer of the Lower Primary Etiology: Extremity Extremity Extremity Abrasion Pressure Ulcer N/A Secondary Etiology: Cataracts, Angina, Type II Diabetes, Cataracts, Angina, Type II Diabetes, Cataracts, Angina, Type II Diabetes, Comorbid History: Gout, Osteoarthritis, Neuropathy Gout, Osteoarthritis, Neuropathy Gout, Osteoarthritis, Neuropathy 11/10/2021 12/04/2021 01/02/2022 Date Acquired: '14 11 6 '$ Weeks of Treatment: Healed - Epithelialized Open Open Wound Status: No No No Wound Recurrence: 0x0x0 1x1x0.2 5.2x2.7x0.2 Measurements L x W x D (cm) 0 0.785 11.027 A (cm) : rea 0 0.157 2.205 Volume (cm) : 100.00% 26.50% -408.60% % Reduction in A rea: 100.00% -46.70% -408.10% % Reduction in Volume: Grade 2 Grade 2 Grade 1 Classification: Medium None Present Medium Exudate A mount: Serosanguineous N/A Serosanguineous Exudate Type: red, brown N/A red, brown Exudate Color: Distinct, outline attached Distinct, outline attached Distinct, outline attached Wound Margin: None Present (0%) Small (1-33%) Medium (34-66%) Granulation A mount: Large  (67-100%) Large (67-100%) Medium (34-66%) Necrotic A mount: Eschar Adherent Slough Eschar Necrotic Tissue: Fat Layer (Subcutaneous Tissue): Yes Fascia: No Fascia: No Exposed Structures: Bone: Yes Fat Layer (Subcutaneous Tissue): No Fat Layer (Subcutaneous Tissue): No Fascia: No Tendon: No Tendon: No Tendon: No Muscle: No Muscle: No Muscle: No Joint: No Joint: No Joint: No Bone: No Bone: No None None None Epithelialization: N/A N/A Debridement - Selective/Open Wound Debridement: Pre-procedure Verification/Time Out N/A N/A 11:26 Taken: N/A N/A Lidocaine Pain Control: N/A N/A Slough Tissue Debrided: N/A N/A Non-Viable Tissue Level: N/A N/A 14.04 Debridement A (sq cm): rea N/A N/A Curette Instrument: N/A N/A Minimum Bleeding: N/A N/A Pressure Hemostasis A chieved: N/A N/A 0 Procedural Pain: N/A N/A 0 Post Procedural Pain: N/A N/A Procedure was tolerated well Debridement Treatment Response: N/A N/A 5.2x2.7x0.2 Post Debridement Measurements L x W x D (cm) N/A N/A 2.205 Post Debridement Volume: (cm) Excoriation: No Excoriation: No Excoriation: No Periwound Skin Texture: Induration: No Induration: No Induration: No Callus: No Callus: No Callus: No Crepitus: No Crepitus: No Crepitus: No Rash: No Rash: No Rash: No Scarring: No Scarring: No Scarring: No Maceration: No Maceration: No Maceration: Yes Periwound Skin Moisture: Dry/Scaly: No Dry/Scaly: No Dry/Scaly: No Atrophie Blanche: No Atrophie Blanche: No Erythema: Yes Periwound Skin Color: Cyanosis: No Cyanosis: No Atrophie Blanche: No Ecchymosis: No Ecchymosis: No Cyanosis: No Erythema: No Erythema: No Ecchymosis: No Hemosiderin Staining: No Hemosiderin Staining: No Hemosiderin Staining: No Mottled: No Mottled: No Mottled: No Pallor: No Pallor: No Pallor: No DOIS, BILLING ([id number] Page 5 of 11 Rubor: No Rubor: No Rubor: No N/A  N/A No Abnormality Temperature: N/A N/A Yes Tenderness on Palpation: N/A N/A Debridement Procedures Performed: Treatment Notes Electronic Signature(s) Signed: 02/24/2022 12:37:22 PM By: Kalman Shan DO Entered By: Kalman Shan on 02/24/2022 11:34:52 -------------------------------------------------------------------------------- Multi-Disciplinary Care Plan Details Patient Name: Date of Service: Kenneth Mons MES L. 02/24/2022 10:30 A M Medical Record Number: [id number] Patient Account Number: [id number] Date of Birth/Sex: Treating RN: 1935-12-13 (87 y.o. [name] Primary Care Makayela Secrest: Dorthy Cooler, Dibas Other Clinician: Referring Bernestine Holsapple: Treating Kelven Flater/Extender: Alanda Slim, Dibas Weeks in Treatment: 26 Active Inactive Pressure Nursing Diagnoses: Knowledge deficit related to management of pressures ulcers Goals: Patient/caregiver will verbalize understanding of pressure ulcer management Date Initiated: 08/26/2021 Target Resolution Date: 03/06/2022 Goal Status: Active Interventions: Assess: immobility, friction, shearing, incontinence upon admission and as needed Assess offloading mechanisms upon admission and as needed Provide education on pressure ulcers Notes: Wound/Skin Impairment Nursing Diagnoses:  Impaired tissue integrity Goals: Patient/caregiver will verbalize understanding of skin care regimen Date Initiated: 08/26/2021 Target Resolution Date: 05/01/2022 Goal Status: Active Ulcer/skin breakdown will have a volume reduction of 30% by week 4 Date Initiated: 08/26/2021 Date Inactivated: 01/09/2022 Target Resolution Date: 01/03/2022 Unmet Reason: see wound Goal Status: Unmet measurement. Interventions: Assess patient/caregiver ability to obtain necessary supplies Assess patient/caregiver ability to perform ulcer/skin care regimen upon admission and as needed Assess ulceration(s) every visit Provide education on ulcer and skin  care Treatment Activities: Topical wound management initiated : 08/26/2021 Notes: Electronic Signature(s) Signed: 03/02/2022 3:50:45 PM By: Rhae Hammock RN Entered By: Rhae Hammock on 02/24/2022 11:14:00 [name] ([id number] Page 6 of 11 -------------------------------------------------------------------------------- Pain Assessment Details Patient Name: Date of Service: [name], Kenneth MES L. 02/24/2022 10:30 A M Medical Record Number: [id number] Patient Account Number: [id number] Date of Birth/Sex: Treating RN: [birth year] (87 y.o. [name] Primary Care Ariyannah Pauling: Dorthy Cooler, Dibas Other Clinician: Referring Carla Rashad: Treating Dailee Manalang/Extender: Alanda Slim, Dibas Weeks in Treatment: 26 Active Problems Location of Pain Severity and Description of Pain Patient Has Paino No Site Locations Pain Management and Medication Current Pain Management: Electronic Signature(s) Signed: 03/02/2022 3:50:45 PM By: Rhae Hammock RN Entered By: Rhae Hammock on 02/24/2022 10:57:26 -------------------------------------------------------------------------------- Patient/Caregiver Education Details Patient Name: Date of Service: Kenneth Mons MES L. 2/20/2024andnbsp10:30 A M Medical Record Number: [id number] Patient Account Number: [id number] Date of Birth/Gender: Treating RN: [birth year] (87 y.o. [name] Primary Care Physician: Dorthy Cooler, Dibas Other Clinician: Referring Physician: Treating Physician/Extender: Alanda Slim, Dibas Weeks in Treatment: 71 Education Assessment Education Provided To: Patient Education Topics Provided Wound/Skin Impairment: Methods: Explain/Verbal Responses: Reinforcements needed, State content correctly Electronic Signature(s) Signed: 03/02/2022 3:50:45 PM By: Rhae Hammock RN Entered By: Rhae Hammock on 02/24/2022 11:14:13 Millett, [name] ([id number] Page 7 of 11 -------------------------------------------------------------------------------- Wound Assessment Details Patient Name: Date of Service: [name], Kenneth MES L. 02/24/2022 10:30 A M Medical Record Number: [id number] Patient Account Number: [id number] Date of Birth/Sex: Treating RN: 05-Jan-1936 (87 y.o. [name], Lauren Primary Care Mainor Hellmann: Dorthy Cooler, Dibas Other Clinician: Referring Malesha Suliman: Treating Yuval Rubens/Extender: Alanda Slim, Dibas Weeks in Treatment: 26 Wound Status Wound Number: 2 Primary Etiology: Diabetic Wound/Ulcer of the Lower Extremity Wound Location: Right T Great oe Secondary Abrasion Etiology: Wounding Event: Trauma Wound Status: Healed - Epithelialized Date Acquired: 11/10/2021 Comorbid Cataracts, Angina, Type II Diabetes, Gout, Osteoarthritis, Weeks Of Treatment: 14 History: Neuropathy Clustered Wound: No Photos Wound Measurements Length: (cm) Width: (cm) Depth: (cm) Area: (cm) Volume: (cm) 0 % Reduction in Area: 100% 0 % Reduction in Volume: 100% 0 Epithelialization: None 0 Tunneling: No 0 Undermining: No Wound Description Classification: Grade 2 Wound Margin: Distinct, outline attached Exudate Amount: Medium Exudate Type: Serosanguineous Exudate Color: red, brown Foul Odor After Cleansing: No Slough/Fibrino Yes Wound Bed Granulation Amount: None Present (0%) Exposed Structure Necrotic Amount: Large (67-100%) Fascia Exposed: No Necrotic Quality: Eschar Fat Layer (Subcutaneous Tissue) Exposed: Yes Tendon Exposed: No Muscle Exposed: No Joint Exposed: No Bone Exposed: Yes Periwound Skin Texture Texture Color No Abnormalities Noted: No No Abnormalities Noted: No Callus: No Atrophie Blanche: No Crepitus: No Cyanosis: No Excoriation: No Ecchymosis: No Induration: No Erythema: No Rash: No Hemosiderin Staining: No Scarring: No Mottled: No Pallor: No Moisture Rubor: No No  Abnormalities Noted: No Dry / Scaly: No Maceration: No Treatment Notes Wound #2 (Toe Great) Wound Laterality: Right JARQUEZ, MCKNIGHT ([id number]) [phone].pdf Page 8 of 11 Peri-Wound Care Topical Primary Dressing Secondary Dressing Secured  With Compression Wrap Compression Stockings Add-Ons Electronic Signature(s) Signed: 03/02/2022 3:50:45 PM By: Rhae Hammock RN Entered By: Rhae Hammock on 02/24/2022 11:24:44 -------------------------------------------------------------------------------- Wound Assessment Details Patient Name: Date of Service: Kenneth Mons MES L. 02/24/2022 10:30 A M Medical Record Number: [id number] Patient Account Number: [id number] Date of Birth/Sex: Treating RN: Sep 16, 1935 (87 y.o. [name], Lauren Primary Care Yazmine Sorey: Dorthy Cooler, Dibas Other Clinician: Referring Everlie Eble: Treating Rosealie Reach/Extender: Alanda Slim, Dibas Weeks in Treatment: 26 Wound Status Wound Number: 3 Primary Etiology: Diabetic Wound/Ulcer of the Lower Extremity Wound Location: Right, Anterior Ankle Secondary Pressure Ulcer Etiology: Wounding Event: Shear/Friction Wound Status: Open Date Acquired: 12/04/2021 Comorbid Cataracts, Angina, Type II Diabetes, Gout, Osteoarthritis, Weeks Of Treatment: 11 History: Neuropathy Clustered Wound: No Photos Wound Measurements Length: (cm) 1 Width: (cm) 1 Depth: (cm) 0.2 Area: (cm) 0.785 Volume: (cm) 0.157 % Reduction in Area: 26.5% % Reduction in Volume: -46.7% Epithelialization: None Tunneling: No Undermining: No Wound Description Classification: Grade 2 Wound Margin: Distinct, outline attached Exudate Amount: None Present Foul Odor After Cleansing: No Slough/Fibrino No Wound Bed Granulation Amount: Small (1-33%) Exposed Structure Necrotic Amount: Large (67-100%) Fascia Exposed: No Necrotic Quality: Adherent Slough Fat Layer (Subcutaneous Tissue) Exposed: No Tendon Exposed:  No Muscle Exposed: No Joint Exposed: No Bone Exposed: No BEOWULF, REPETTI L ([id number] Page 9 of 11 Periwound Skin Texture Texture Color No Abnormalities Noted: No No Abnormalities Noted: No Callus: No Atrophie Blanche: No Crepitus: No Cyanosis: No Excoriation: No Ecchymosis: No Induration: No Erythema: No Rash: No Hemosiderin Staining: No Scarring: No Mottled: No Pallor: No Moisture Rubor: No No Abnormalities Noted: No Dry / Scaly: No Maceration: No Treatment Notes Wound #3 (Ankle) Wound Laterality: Right, Anterior Cleanser Soap and Water Discharge Instruction: May shower and wash wound with dial antibacterial soap and water prior to dressing change. Peri-Wound Care Skin Prep Discharge Instruction: Use skin prep as directed Topical Primary Dressing Santyl Ointment Discharge Instruction: Apply nickel thick amount to wound bed as instructed Hydrofera Blue Ready Foam, 2.5 x2.5 in Discharge Instruction: Apply to wound bed as instructed Secondary Dressing Woven Gauze Sponges 2x2 in Discharge Instruction: Apply over primary dressing as directed. Secured With 19M Medipore H Soft Cloth Surgical T ape, 4 x 10 (in/yd) Discharge Instruction: Secure with tape as directed. Compression Wrap Compression Stockings Add-Ons Cotton Tip Swabs Electronic Signature(s) Signed: 03/02/2022 3:50:45 PM By: Rhae Hammock RN Entered By: Rhae Hammock on 02/24/2022 11:08:31 -------------------------------------------------------------------------------- Wound Assessment Details Patient Name: Date of Service: Kenneth Mons MES L. 02/24/2022 10:30 A M Medical Record Number: [id number] Patient Account Number: [id number] Date of Birth/Sex: Treating RN: [birth year] (87 y.o. [name] Primary Care Melodee Lupe: Dorthy Cooler, Dibas Other Clinician: Referring Kanton Kamel: Treating Elisabeth Strom/Extender: Alanda Slim, Dibas Weeks in Treatment:  26 Wound Status Wound Number: 4 Primary Diabetic Wound/Ulcer of the Lower Extremity Etiology: Wound Location: Left, Anterior Ankle Wound Status: Open Wounding Event: Gradually Appeared Comorbid Cataracts, Angina, Type II Diabetes, Gout, Osteoarthritis, Date Acquired: 01/02/2022 History: Neuropathy Weeks Of Treatment: 6 Clustered Wound: No Photos HARSHAL, BURR ([id number]) [phone].pdf Page 10 of 11 Wound Measurements Length: (cm) 5.2 Width: (cm) 2.7 Depth: (cm) 0.2 Area: (cm) 11.027 Volume: (cm) 2.205 % Reduction in Area: -408.6% % Reduction in Volume: -408.1% Epithelialization: None Tunneling: No Undermining: No Wound Description Classification: Grade 1 Wound Margin: Distinct, outline attached Exudate Amount: Medium Exudate Type: Serosanguineous Exudate Color: red, brown Foul Odor After Cleansing: No Slough/Fibrino Yes Wound Bed Granulation Amount: Medium (34-66%) Exposed Structure Necrotic Amount: Medium (34-66%) Fascia Exposed:  No Necrotic Quality: Eschar Fat Layer (Subcutaneous Tissue) Exposed: No Tendon Exposed: No Muscle Exposed: No Joint Exposed: No Bone Exposed: No Periwound Skin Texture Texture Color No Abnormalities Noted: No No Abnormalities Noted: No Callus: No Atrophie Blanche: No Crepitus: No Cyanosis: No Excoriation: No Ecchymosis: No Induration: No Erythema: Yes Rash: No Hemosiderin Staining: No Scarring: No Mottled: No Pallor: No Moisture Rubor: No No Abnormalities Noted: No Dry / Scaly: No Temperature / Pain Maceration: Yes Temperature: No Abnormality Tenderness on Palpation: Yes Treatment Notes Wound #4 (Ankle) Wound Laterality: Left, Anterior Cleanser Soap and Water Discharge Instruction: May shower and wash wound with dial antibacterial soap and water prior to dressing change. Peri-Wound Care Skin Prep Discharge Instruction: Use skin prep as directed Topical Primary Dressing Santyl  Ointment Discharge Instruction: Apply nickel thick amount to wound bed as instructed Hydrofera Blue Ready Foam, 2.5 x2.5 in Discharge Instruction: Apply to wound bed as instructed Secondary Dressing Woven Gauze Sponges 2x2 in Discharge Instruction: Apply over primary dressing as directed. OLUSEYI, [name] ([id number]) [id number] Page 11 of 11 Secured With 27M Medipore [employer] Surgical T ape, 4 x 10 (in/yd) Discharge Instruction: Secure with tape as directed. Compression Wrap Compression Stockings Add-Ons Cotton Tip Swabs Electronic Signature(s) Signed: 03/02/2022 3:50:45 PM By: Rhae Hammock RN Entered By: Rhae Hammock on 02/24/2022 11:08:52 -------------------------------------------------------------------------------- Vitals Details Patient Name: Date of Service: Kenneth Mons MES L. 02/24/2022 10:30 A M Medical Record Number: [id number] Patient Account Number: [id number] Date of Birth/Sex: Treating RN: July 09, 1935 (87 y.o. [name] Primary Care Rozalyn Osland: Dorthy Cooler, Dibas Other Clinician: Referring Wendolyn Raso: Treating Daiwik Buffalo/Extender: Alanda Slim, Dibas Weeks in Treatment: 26 Vital Signs Time Taken: 10:57 Temperature (F): 98.1 Height (in): 68 Pulse (bpm): 74 Weight (lbs): 198 Respiratory Rate (breaths/min): 17 Body Mass Index (BMI): 30.1 Blood Pressure (mmHg): 121/79 Reference Range: 80 - 120 mg / dl Electronic Signature(s) Signed: 03/02/2022 3:50:45 PM By: Rhae Hammock RN Entered By: Rhae Hammock on 02/24/2022 10:57:19

## 2022-03-13 ENCOUNTER — Other Ambulatory Visit: Payer: Self-pay

## 2022-03-13 ENCOUNTER — Ambulatory Visit (INDEPENDENT_AMBULATORY_CARE_PROVIDER_SITE_OTHER): Payer: Medicare Other | Admitting: Physician Assistant

## 2022-03-13 ENCOUNTER — Ambulatory Visit (INDEPENDENT_AMBULATORY_CARE_PROVIDER_SITE_OTHER)
Admission: RE | Admit: 2022-03-13 | Discharge: 2022-03-13 | Disposition: A | Discharge status: Home / Self Care | Payer: Medicare Other | Source: Ambulatory Visit | Attending: Vascular Surgery | Admitting: Vascular Surgery

## 2022-03-13 ENCOUNTER — Ambulatory Visit (HOSPITAL_COMMUNITY)
Admission: RE | Admit: 2022-03-13 | Discharge: 2022-03-13 | Disposition: A | Discharge status: Home / Self Care | Payer: Medicare Other | Source: Ambulatory Visit | Attending: Vascular Surgery | Admitting: Vascular Surgery

## 2022-03-13 VITALS — BP 164/75 | HR 78 | Temp 97.8°F | Resp 16 | Ht 69.0 in | Wt 190.0 lb

## 2022-03-13 DIAGNOSIS — I7025 Atherosclerosis of native arteries of other extremities with ulceration: Secondary | ICD-10-CM | POA: Diagnosis not present

## 2022-03-13 DIAGNOSIS — I70223 Atherosclerosis of native arteries of extremities with rest pain, bilateral legs: Secondary | ICD-10-CM

## 2022-03-13 LAB — VAS US ABI WITH/WO TBI
Left ABI: 0.52
Right ABI: 0.89

## 2022-03-13 NOTE — Progress Notes (Signed)
Office Note   History of Present Illness   [name] is a 87 y.o. (05/30/1935) male who presents for wound check.  He is status post bilateral iliofemoral endarterectomy with bilateral common iliac artery stents on 10/09/2021 by Dr. Virl Cagey.  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  His right heel ulcer has since healed.  Unfortunately postoperatively he developed a right great toe and ankle wound, which have been slowly healing.  In January he also developed a new ulcer on his left distal anterior shin.  He has been under the care of of the wound care center for these wounds.  At his last visit he was told if his left distal shin wound did not improve we would likely have to proceed with repeat angiogram.  At follow-up today, he believes that his wounds on the right great toe and ankle are improving.  His right ankle wound has scabbed over and his right great toe wound is more superficial.  He is not sure if his left distal shin wound has made any progress.  Neither he or his daughter is sure on how often he needs to have dressing changes, but currently the daughter changes his dressings every 2 days with santyl and blue foam.  He endorses some serous drainage from his left distal shin wound.  He denies any fevers, pus, or erythema.  He tries to keep his legs elevated in his recliner or bed.  Current Outpatient Medications  Medication Sig Dispense Refill   amoxicillin (AMOXIL) 500 MG capsule TAKE 1 CAPSULE BY MOUTH TWICE A DAY 60 capsule 2   APPLE CIDER VINEGAR PO Take 1 capsule by mouth daily.     atenolol (TENORMIN) 50 MG tablet Take 50 mg by mouth daily.     clopidogrel (PLAVIX) 75 MG tablet TAKE 1 TABLET (75 MG TOTAL) BY MOUTH DAILY AT 6 (SIX) AM. 90 tablet 1   fluticasone (FLONASE) 50 MCG/ACT nasal spray Place 2 sprays into both nostrils daily.     furosemide (LASIX) 20 MG tablet Take 20 mg by mouth daily.     hydrALAZINE (APRESOLINE) 25 MG  tablet Take 1 tablet (25 mg total) by mouth every 8 (eight) hours.     irbesartan (AVAPRO) 75 MG tablet Take 75 mg by mouth daily.     LYRICA 75 MG capsule Take 75-150 mg by mouth See admin instructions. Take 75 mg by mouth in the morning and 150 mg at night     meloxicam (MOBIC) 7.5 MG tablet Take 7.5 mg by mouth daily.     metFORMIN (GLUCOPHAGE) 1000 MG tablet Take 1,000 mg by mouth in the morning and at bedtime.     Multiple Vitamin (MULTIVITAMIN WITH MINERALS) TABS tablet Take 1 tablet by mouth daily.     neomycin-bacitracin-polymyxin (NEOSPORIN) [phone] ointment Apply 1 Application topically daily as needed (wound care).     OVER THE COUNTER MEDICATION Take 1 capsule by mouth daily. Balance of Nature Fruits     OVER THE COUNTER MEDICATION Take 1 tablet by mouth daily. Beet supplement     protein supplement shake (PREMIER PROTEIN) LIQD Take 11 oz by mouth daily.     rosuvastatin (CRESTOR) 20 MG tablet Take 1 tablet (20 mg total) by mouth daily. 90 tablet 3   Tetrahydrozoline HCl (VISINE OP) Place 1 drop into both eyes daily as needed (burning).     traMADol (ULTRAM) 50 MG tablet Take 2 tablets (100  mg total) by mouth every 6 (six) hours as needed. Every 4 hours 30 tablet 0   ULORIC 80 MG TABS Take 80 mg by mouth daily.  3   aspirin EC 81 MG tablet Take 81 mg by mouth every other day. Swallow whole. (Patient not taking: Reported on 03/13/2022)     cefadroxil (DURICEF) 500 MG capsule Take 1 capsule (500 mg total) by mouth 2 (two) times daily. (Patient not taking: Reported on 03/13/2022) 60 capsule 1   clindamycin (CLEOCIN) 150 MG capsule Take 450 mg by mouth 3 (three) times daily. (Patient not taking: Reported on 03/13/2022)     doxycycline (VIBRA-TABS) 100 MG tablet Take 1 tablet (100 mg total) by mouth 2 (two) times daily. (Patient not taking: Reported on 03/13/2022) 60 tablet 1   No current facility-administered medications for this visit.    REVIEW OF SYSTEMS (negative unless checked):    Cardiac:  '[]'$  Chest pain or chest pressure? '[]'$  Shortness of breath upon activity? '[]'$  Shortness of breath when lying flat? '[]'$  Irregular heart rhythm?  Vascular:  '[]'$  Pain in calf, thigh, or hip brought on by walking? '[]'$  Pain in feet at night that wakes you up from your sleep? '[]'$  Blood clot in your veins? '[]'$  Leg swelling?  Pulmonary:  '[]'$  Oxygen at home? '[]'$  Productive cough? '[]'$  Wheezing?  Neurologic:  '[]'$  Sudden weakness in arms or legs? '[]'$  Sudden numbness in arms or legs? '[]'$  Sudden onset of difficult speaking or slurred speech? '[]'$  Temporary loss of vision in one eye? '[]'$  Problems with dizziness?  Gastrointestinal:  '[]'$  Blood in stool? '[]'$  Vomited blood?  Genitourinary:  '[]'$  Burning when urinating? '[]'$  Blood in urine?  Psychiatric:  '[]'$  Major depression  Hematologic:  '[]'$  Bleeding problems? '[]'$  Problems with blood clotting?  Dermatologic:  '[]'$  Rashes or ulcers?  Constitutional:  '[]'$  Fever or chills?  Ear/Nose/Throat:  '[]'$  Change in hearing? '[]'$  Nose bleeds? '[]'$  Sore throat?  Musculoskeletal:  '[]'$  Back pain? '[]'$  Joint pain? '[]'$  Muscle pain?   Physical Examination   Vitals:   03/13/22 0947  BP: (!) 164/75  Pulse: 78  Resp: 16  Temp: 97.8 F (36.6 C)  TempSrc: Temporal  SpO2: 94%  Weight: 190 lb (86.2 kg)  Height: '5\' 9"'$  (1.753 m)   Body mass index is 28.06 kg/m.  General:  WDWN in NAD; vital signs documented above Gait: Not observed HENT: WNL, normocephalic Pulmonary: normal non-labored breathing  Cardiac: regular rate and rhythm Abdomen: soft, NT, no masses Skin: without rashes Vascular Exam/Pulses: monophasic DP/PT doppler signals on the left. Biphasic DP/PT doppler signals on the right Extremities: Right great toe ulceration with improved appearance and more superficial.  Right ankle wound improved in appearance with scab.  Left anterior distal shin wound with no change in appearance. Edematous lower legs bilaterally Musculoskeletal: no muscle wasting  or atrophy  Neurologic: A&O X 3;  No focal weakness or paresthesias are detected Psychiatric:  The pt has Normal affect.         Non-Invasive Vascular imaging   ABI (03/13/2022) R:  ABI: 0.89 (0.84),  PT: bi DP: bi TBI:  0.56 L:  ABI: 0.52 (0.48),  PT: mono DP: mono TBI: 0.43  Aortoiliac Duplex (03/13/2022) Patent bilateral common iliac artery stents with no stenosis   Medical Decision Making   [name] is a 87 y.o. male who presents for wound check  Based on the patient's vascular studies, his ABIs are slightly improved since his last visit.  His  right ABI was 0.84 and is now 0.89.  His left ABI was 0.4 and is now 0.52.  He has biphasic flow on the right and monophasic flow in the left  Duplex demonstrates patent bilateral common iliac artery stents with no stenosis  The wounds on his right lower extremity are slowly improving.  Unfortunately the wound on his left anterior distal shin has not improved over the past month  He will continue with wound care management.  He will be scheduled for left lower extremity angiogram with possible intervention on 03/18/2022 with Dr. Virl Cagey Continue plavix and statin  Vicente Serene PA-C [facility] [place] [facility] Office: [facility] MD: Virl Cagey

## 2022-03-18 ENCOUNTER — Ambulatory Visit (HOSPITAL_COMMUNITY)
Admission: RE | Disposition: A | Discharge status: Home / Self Care | Payer: Self-pay | Source: Home / Self Care | Attending: Vascular Surgery

## 2022-03-18 ENCOUNTER — Ambulatory Visit (HOSPITAL_COMMUNITY)
Admission: RE | Admit: 2022-03-18 | Discharge: 2022-03-18 | Disposition: A | Discharge status: Home / Self Care | Payer: Medicare Other | Attending: Vascular Surgery | Admitting: Vascular Surgery

## 2022-03-18 ENCOUNTER — Other Ambulatory Visit: Payer: Self-pay

## 2022-03-18 DIAGNOSIS — I70242 Atherosclerosis of native arteries of left leg with ulceration of calf: Secondary | ICD-10-CM

## 2022-03-18 DIAGNOSIS — L97829 Non-pressure chronic ulcer of other part of left lower leg with unspecified severity: Secondary | ICD-10-CM | POA: Diagnosis not present

## 2022-03-18 DIAGNOSIS — Z7902 Long term (current) use of antithrombotics/antiplatelets: Secondary | ICD-10-CM | POA: Insufficient documentation

## 2022-03-18 DIAGNOSIS — I70248 Atherosclerosis of native arteries of left leg with ulceration of other part of lower left leg: Secondary | ICD-10-CM | POA: Diagnosis not present

## 2022-03-18 DIAGNOSIS — I7025 Atherosclerosis of native arteries of other extremities with ulceration: Secondary | ICD-10-CM

## 2022-03-18 DIAGNOSIS — Z79899 Other long term (current) drug therapy: Secondary | ICD-10-CM | POA: Diagnosis not present

## 2022-03-18 DIAGNOSIS — I70223 Atherosclerosis of native arteries of extremities with rest pain, bilateral legs: Secondary | ICD-10-CM

## 2022-03-18 HISTORY — PX: ABDOMINAL AORTOGRAM W/LOWER EXTREMITY: CATH118223

## 2022-03-18 LAB — POCT I-STAT, CHEM 8
BUN: 17 mg/dL (ref 8–23)
Calcium, Ion: 1.31 mmol/L (ref 1.15–1.40)
Chloride: 100 mmol/L (ref 98–111)
Creatinine, Ser: 0.5 mg/dL — ABNORMAL LOW (ref 0.61–1.24)
Glucose, Bld: 118 mg/dL — ABNORMAL HIGH (ref 70–99)
HCT: 44 % (ref 39.0–52.0)
Hemoglobin: 15 g/dL (ref 13.0–17.0)
Potassium: 4 mmol/L (ref 3.5–5.1)
Sodium: 136 mmol/L (ref 135–145)
TCO2: 25 mmol/L (ref 22–32)

## 2022-03-18 SURGERY — ABDOMINAL AORTOGRAM W/LOWER EXTREMITY
Anesthesia: LOCAL

## 2022-03-18 MED ORDER — LABETALOL HCL 5 MG/ML IV SOLN
INTRAVENOUS | Status: DC | PRN
  Administered 2022-03-18: 10 mg
  Administered 2022-03-18: 10 mg via INTRAVENOUS

## 2022-03-18 MED ORDER — FENTANYL CITRATE (PF) 100 MCG/2ML IJ SOLN
INTRAMUSCULAR | Status: AC
  Filled 2022-03-18: qty 2

## 2022-03-18 MED ORDER — LABETALOL HCL 5 MG/ML IV SOLN
10.0000 mg | INTRAVENOUS | Status: DC | PRN
  Administered 2022-03-18: 10 mg via INTRAVENOUS
  Filled 2022-03-18: qty 4

## 2022-03-18 MED ORDER — FENTANYL CITRATE (PF) 100 MCG/2ML IJ SOLN
INTRAMUSCULAR | Status: DC | PRN
  Administered 2022-03-18: 50 ug via INTRAVENOUS

## 2022-03-18 MED ORDER — SODIUM CHLORIDE 0.9% FLUSH: 3.0000 mL | INTRAVENOUS | Status: DC | PRN

## 2022-03-18 MED ORDER — HEPARIN (PORCINE) IN NACL 1000-0.9 UT/500ML-% IV SOLN
INTRAVENOUS | Status: DC | PRN
  Administered 2022-03-18 (×2): 500 mL

## 2022-03-18 MED ORDER — MIDAZOLAM HCL 5 MG/5ML IJ SOLN
INTRAMUSCULAR | Status: AC
  Filled 2022-03-18: qty 5

## 2022-03-18 MED ORDER — SODIUM CHLORIDE 0.9 % IV SOLN: 250.0000 mL | INTRAVENOUS | Status: DC | PRN

## 2022-03-18 MED ORDER — LIDOCAINE HCL (PF) 1 % IJ SOLN
INTRAMUSCULAR | Status: DC | PRN
  Administered 2022-03-18: 15 mL

## 2022-03-18 MED ORDER — SODIUM CHLORIDE 0.9 % IV SOLN: INTRAVENOUS | Status: DC

## 2022-03-18 MED ORDER — ACETAMINOPHEN 325 MG PO TABS: 650.0000 mg | ORAL_TABLET | ORAL | Status: DC | PRN

## 2022-03-18 MED ORDER — HYDRALAZINE HCL 20 MG/ML IJ SOLN: 5.0000 mg | INTRAMUSCULAR | Status: DC | PRN

## 2022-03-18 MED ORDER — LIDOCAINE HCL (PF) 1 % IJ SOLN
INTRAMUSCULAR | Status: AC
  Filled 2022-03-18: qty 30

## 2022-03-18 MED ORDER — SODIUM CHLORIDE 0.9% FLUSH: 3.0000 mL | Freq: Two times a day (BID) | INTRAVENOUS | Status: DC

## 2022-03-18 MED ORDER — LABETALOL HCL 5 MG/ML IV SOLN
INTRAVENOUS | Status: AC
  Filled 2022-03-18: qty 4

## 2022-03-18 MED ORDER — MIDAZOLAM HCL 2 MG/2ML IJ SOLN
INTRAMUSCULAR | Status: DC | PRN
  Administered 2022-03-18: 1 mg via INTRAVENOUS

## 2022-03-18 MED ORDER — ONDANSETRON HCL 4 MG/2ML IJ SOLN: 4.0000 mg | Freq: Four times a day (QID) | INTRAMUSCULAR | Status: DC | PRN

## 2022-03-18 MED ORDER — SODIUM CHLORIDE 0.9 % WEIGHT BASED INFUSION: 1.0000 mL/kg/h | INTRAVENOUS | Status: DC

## 2022-03-18 MED ORDER — IODIXANOL 320 MG/ML IV SOLN
INTRAVENOUS | Status: DC | PRN
  Administered 2022-03-18: 80 mL

## 2022-03-18 SURGICAL SUPPLY — 16 items
CATH OMNI FLUSH 5F 65CM (CATHETERS) IMPLANT
CATH QUICKCROSS .035X135CM (MICROCATHETER) IMPLANT
DEVICE CLOSURE MYNXGRIP 5F (Vascular Products) IMPLANT
GLIDEWIRE ADV .035X260CM (WIRE) IMPLANT
KIT MICROPUNCTURE NIT STIFF (SHEATH) IMPLANT
KIT PV (KITS) ×1 IMPLANT
SHEATH GLIDE SLENDER 4/5FR (SHEATH) IMPLANT
SHEATH PINNACLE 4F 10CM (SHEATH) IMPLANT
SHEATH PINNACLE 5F 10CM (SHEATH) IMPLANT
SHEATH PROBE COVER 6X72 (BAG) IMPLANT
STOPCOCK MORSE 400PSI 3WAY (MISCELLANEOUS) IMPLANT
SYR MEDRAD MARK 7 150ML (SYRINGE) ×1 IMPLANT
TRANSDUCER W/STOPCOCK (MISCELLANEOUS) ×1 IMPLANT
TRAY PV CATH (CUSTOM PROCEDURE TRAY) ×1 IMPLANT
TUBING CIL FLEX 10 FLL-RA (TUBING) IMPLANT
WIRE HITORQ VERSACORE ST 145CM (WIRE) IMPLANT

## 2022-03-18 NOTE — Discharge Instructions (Signed)
NO METFORMIN FOR 2 DAYS 

## 2022-03-18 NOTE — Progress Notes (Signed)
Up and walked and tolerated well; right groin stable, no bleeding or hematoma 

## 2022-03-18 NOTE — Op Note (Signed)
Patient name: [name] MRN: [id number] DOB: Aug 02, 1935 Sex: male  03/18/2022 Pre-operative Diagnosis: Left lower extremity critical limb ischemia with tissue loss at the calf Post-operative diagnosis:  Same Surgeon:  [name], MD Procedure Performed: 1.  Ultrasound-guided micropuncture access of the right common femoral artery 2.  Aortogram 3.  Secondary cannulation, left lower extremity angiogram 5.  Device assisted closure-Mynx 6.  Moderate sedation time 25 minutes, contrast volume 80 mL 7.  Ultrasound-guided micropuncture access of a deep vein-right basilic vein for IV access    Indications: Patient is an 87 year old male well-known to my service having undergone bilateral iliofemoral endarterectomies, bilateral common iliac artery stents.  He presented to the outpatient clinic with mixed arterial venous disease bilaterally, and wounds on the left calf.  I saw him roughly 1 month ago, and we decided that should the wounds continue to be a problem, we would discuss left lower extremity angiogram in an effort to define and improve distal perfusion for wound healing.  At the 1 month mark, the wounds continued to be present, with limited healing.  After discussing the above, [name] elected to pursue angiogram in an effort to define and possibly improve perfusion for wound healing.  Findings:  No flow-limiting stenosis in the aortoiliac segments bilaterally.  Widely patent common iliac artery stents  On the left: Widely patent common femoral artery, profunda, the superficial femoral artery is atretic, and occludes after roughly 4 cm.  The profunda is large, with large collaterals extending throughout the thigh with reconstitution in the P2 segment of the popliteal artery.  Distally, there appears to be three-vessel runoff to the foot.   Procedure:  The patient was identified in the holding area and taken to room 8.  The patient was then placed supine on the table and prepped and  draped in the usual sterile fashion.  A time out was called.  Ultrasound was used to evaluate the right common femoral artery.  It was patent .  A digital ultrasound image was acquired.  A micropuncture needle was used to access the right common femoral artery under ultrasound guidance.  An 018 wire was advanced without resistance and a micropuncture sheath was placed.  The 018 wire was removed and a benson wire was placed.  The micropuncture sheath was exchanged for a 5 french sheath.  An omniflush catheter was advanced over the wire to the level of L-1.  An abdominal angiogram was obtained.  Next, using the omniflush catheter and a benson wire, the aortic bifurcation was crossed and the catheter was placed into theleft external iliac artery and left runoff was obtained.   The left superficial femoral artery appeared to be chronically occluded.  I initially tried to drive a Glidewire advantage with a quick cross catheter through the lesion, and quickly ended in a subintimal plane.  I had no push ability after roughly 3 cm.  Being that outflow was preserved, and that there was a diffuse network of collaterals at the P2 segment of the popliteal artery, I elected to terminate the case and discuss redo femoral exposure femoral to below-knee popliteal artery bypass for TASC D lesion.  Impression: Chronically occluded superficial femoral artery with reconstitution at the P2 segment of the popliteal artery.  Patient would benefit from redo left common femoral artery exposure, femoral to below-knee popliteal artery bypass.  After discussing this with the patient, he elected to consider his options, and asked for a 1 month follow-up.  I think  this is reasonable as the wounds are located on the calf and are very much from mixed arterial venous disease.    Kenneth Santee, MD [facility] [place] Office: [phone]

## 2022-03-18 NOTE — H&P (Signed)
Patient seen and examined in preop holding.  No complaints. No changes to medication history or physical exam since last seen in clinic. After discussing the risks and benefits of LLE angiogram for critical limb ischemia with tissue loss, [name] elected to proceed.   [name] MD  Office Note   History of Present Illness   [name] is a 87 y.o. (1935/05/14) male who presents for wound check.  He is status post bilateral iliofemoral endarterectomy with bilateral common iliac artery stents on 10/09/2021 by Dr. Virl Cagey.  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  His right heel ulcer has since healed.  Unfortunately postoperatively he developed a right great toe and ankle wound, which have been slowly healing.  In January he also developed a new ulcer on his left distal anterior shin.  He has been under the care of of the wound care center for these wounds.  At his last visit he was told if his left distal shin wound did not improve we would likely have to proceed with repeat angiogram.  At follow-up today, he believes that his wounds on the right great toe and ankle are improving.  His right ankle wound has scabbed over and his right great toe wound is more superficial.  He is not sure if his left distal shin wound has made any progress.  Neither he or his daughter is sure on how often he needs to have dressing changes, but currently the daughter changes his dressings every 2 days with santyl and blue foam.  He endorses some serous drainage from his left distal shin wound.  He denies any fevers, pus, or erythema.  He tries to keep his legs elevated in his recliner or bed.  Current Facility-Administered Medications  Medication Dose Route Frequency Provider Last Rate Last Admin   0.9 %  sodium chloride infusion   Intravenous Continuous [name], MD        REVIEW OF SYSTEMS (negative unless checked):   Cardiac:  '[]'$  Chest pain or  chest pressure? '[]'$  Shortness of breath upon activity? '[]'$  Shortness of breath when lying flat? '[]'$  Irregular heart rhythm?  Vascular:  '[]'$  Pain in calf, thigh, or hip brought on by walking? '[]'$  Pain in feet at night that wakes you up from your sleep? '[]'$  Blood clot in your veins? '[]'$  Leg swelling?  Pulmonary:  '[]'$  Oxygen at home? '[]'$  Productive cough? '[]'$  Wheezing?  Neurologic:  '[]'$  Sudden weakness in arms or legs? '[]'$  Sudden numbness in arms or legs? '[]'$  Sudden onset of difficult speaking or slurred speech? '[]'$  Temporary loss of vision in one eye? '[]'$  Problems with dizziness?  Gastrointestinal:  '[]'$  Blood in stool? '[]'$  Vomited blood?  Genitourinary:  '[]'$  Burning when urinating? '[]'$  Blood in urine?  Psychiatric:  '[]'$  Major depression  Hematologic:  '[]'$  Bleeding problems? '[]'$  Problems with blood clotting?  Dermatologic:  '[]'$  Rashes or ulcers?  Constitutional:  '[]'$  Fever or chills?  Ear/Nose/Throat:  '[]'$  Change in hearing? '[]'$  Nose bleeds? '[]'$  Sore throat?  Musculoskeletal:  '[]'$  Back pain? '[]'$  Joint pain? '[]'$  Muscle pain?   Physical Examination   Vitals:   03/18/22 1009  BP: (!) 176/85  Pulse: 94  Resp: 18  Temp: (!) 97 F (36.1 C)  TempSrc: Temporal  SpO2: 95%  Weight: 86.2 kg  Height: '5\' 9"'$  (1.753 m)   Body mass index is 28.06 kg/m.  General:  WDWN in NAD; vital  signs documented above Gait: Not observed HENT: WNL, normocephalic Pulmonary: normal non-labored breathing  Cardiac: regular rate and rhythm Abdomen: soft, NT, no masses Skin: without rashes Vascular Exam/Pulses: monophasic DP/PT doppler signals on the left. Biphasic DP/PT doppler signals on the right Extremities: Right great toe ulceration with improved appearance and more superficial.  Right ankle wound improved in appearance with scab.  Left anterior distal shin wound with no change in appearance. Edematous lower legs bilaterally Musculoskeletal: no muscle wasting or atrophy  Neurologic: A&O X 3;  No  focal weakness or paresthesias are detected Psychiatric:  The pt has Normal affect.         Non-Invasive Vascular imaging   ABI (03/13/2022) R:  ABI: 0.89 (0.84),  PT: bi DP: bi TBI:  0.56 L:  ABI: 0.52 (0.48),  PT: mono DP: mono TBI: 0.43  Aortoiliac Duplex (03/13/2022) Patent bilateral common iliac artery stents with no stenosis   Medical Decision Making   [name] is a 87 y.o. male who presents for wound check  Based on the patient's vascular studies, his ABIs are slightly improved since his last visit.  His right ABI was 0.84 and is now 0.89.  His left ABI was 0.4 and is now 0.52.  He has biphasic flow on the right and monophasic flow in the left  Duplex demonstrates patent bilateral common iliac artery stents with no stenosis  The wounds on his right lower extremity are slowly improving.  Unfortunately the wound on his left anterior distal shin has not improved over the past month  He will continue with wound care management.  He will be scheduled for left lower extremity angiogram with possible intervention on 03/18/2022 with Dr. Virl Cagey Continue plavix and statin  Vicente Serene PA-C [facility] [place] Office: [facility] MD: Virl Cagey

## 2022-03-19 ENCOUNTER — Encounter (HOSPITAL_COMMUNITY): Payer: Self-pay | Admitting: Vascular Surgery

## 2022-03-19 MED FILL — Midazolam HCl Inj 5 MG/5ML (Base Equivalent): INTRAMUSCULAR | Qty: 1 | Status: AC

## 2022-03-20 ENCOUNTER — Telehealth: Payer: Self-pay | Admitting: Vascular Surgery

## 2022-03-20 NOTE — Telephone Encounter (Signed)
----- Message from [name], MD sent at 03/18/2022  1:59 PM EDT ----- Regarding: Angio Case     Patient name: [name] MRN: [id number] DOB: 06/14/1935 Sex: male  03/18/2022 Pre-operative Diagnosis: Left lower extremity critical limb ischemia with tissue loss at the calf Post-operative diagnosis:  Same Surgeon:  [name], MD Procedure Performed: 1.  Ultrasound-guided micropuncture access of the right common femoral artery 2.  Aortogram 3.  Secondary cannulation, left lower extremity angiogram 5.  Device assisted closure-Mynx 6.  Moderate sedation time 25 minutes, contrast volume 80 mL 7.  Ultrasound-guided micropuncture access of a deep vein-right basilic vein for IV access    Indications: Patient is an 87 year old male well-known to my service having undergone bilateral iliofemoral endarterectomies, bilateral common iliac artery stents.  He presented to the outpatient clinic with mixed arterial venous disease bilaterally, and wounds on the left calf.  I saw him roughly 1 month ago, and we decided that should the wounds continue to be a problem, we would discuss left lower extremity angiogram in an effort to define and improve distal perfusion for wound healing.  At the 1 month mark, the wounds continued to be present, with limited healing.  After discussing the above, [name] elected to pursue angiogram in an effort to define and possibly improve perfusion for wound healing.  Findings:  No flow-limiting stenosis in the aortoiliac segments bilaterally.  Widely patent common iliac artery stents  On the left: Widely patent common femoral artery, profunda, the superficial femoral artery is atretic, and occludes after roughly 4 cm.  The profunda is large, with large collaterals extending throughout the thigh with reconstitution in the P2 segment of the popliteal artery.  Distally, there appears to be three-vessel runoff to the foot.   Procedure:  The patient was identified in the  holding area and taken to room 8.  The patient was then placed supine on the table and prepped and draped in the usual sterile fashion.  A time out was called.  Ultrasound was used to evaluate the right common femoral artery.  It was patent .  A digital ultrasound image was acquired.  A micropuncture needle was used to access the right common femoral artery under ultrasound guidance.  An 018 wire was advanced without resistance and a micropuncture sheath was placed.  The 018 wire was removed and a benson wire was placed.  The micropuncture sheath was exchanged for a 5 french sheath.  An omniflush catheter was advanced over the wire to the level of L-1.  An abdominal angiogram was obtained.  Next, using the omniflush catheter and a benson wire, the aortic bifurcation was crossed and the catheter was placed into theleft external iliac artery and left runoff was obtained.   The left superficial femoral artery appeared to be chronically occluded.  I initially tried to drive a Glidewire advantage with a quick cross catheter through the lesion, and quickly ended in a subintimal plane.  I had no push ability after roughly 3 cm.  Being that outflow was preserved, and that there was a diffuse network of collaterals at the P2 segment of the popliteal artery, I elected to terminate the case and discuss redo femoral exposure femoral to below-knee popliteal artery bypass for TASC D lesion.  Impression: Chronically occluded superficial femoral artery with reconstitution at the P2 segment of the popliteal artery.  Patient would benefit from redo left common femoral artery exposure, femoral to below-knee popliteal artery bypass.  After discussing  this with the patient, he elected to consider his options, and asked for a 1 month follow-up.  I think this is reasonable as the wounds are located on the calf, are very much from mixed arterial venous disease, and he has a toe pressure >77mmhg.  Please schedule 1 month follow up  with me with bilateral lower extremity vein mapping     Cassandria Santee, MD [facility] [place] Office: [phone]

## 2022-03-24 ENCOUNTER — Ambulatory Visit (HOSPITAL_BASED_OUTPATIENT_CLINIC_OR_DEPARTMENT_OTHER): Payer: Medicare Other | Admitting: Internal Medicine

## 2022-03-30 NOTE — Progress Notes (Unsigned)
Cardiology Office Note   Date:  03/31/2022   ID:  [name], DOB 1935-05-09, MRN [id number]  PCP:  Lujean Amel, MD    No chief complaint on file.  AFib  Wt Readings from Last 3 Encounters:  03/31/22 192 lb 12.8 oz (87.5 kg)  03/18/22 190 lb (86.2 kg)  03/13/22 190 lb (86.2 kg)       History of Present Illness: [name] is a 87 y.o. male with severe PAD.  Prior records from 2016 showed that he was seen by cardiology in the hospital.  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 Cards saw for atrial fib, new dx. Had  acute encephalopathy, then had [facility] 05/07.   Pt had drainage of his psoas abscess today Still in atrial fib.  HR is wll controlled. BP is well controlled.    Still in atrial fib.  Not a candidate for anticoagulation No active cardiac issues. Will sign off. Call for questions "  I saw him in September 2023 for preoperative evaluation before vascular surgery.   Discussed his longstanding atrial fibrillation at that time as well: " Noted many years ago.  He was not thought to be a candidate for anticoagulation.  He has not had cardiology follow-up since that time when he was hospitalized in 2016.   Took warfarin for stroke prevention but due to bleeding around the eyes (sclera), this was stopped.  He does not remember what his INR was at the time.  He remains in atrial fibrillation today.  He has no symptoms.  Could consider trying a DOAC.  This may be better tolerated from a bleeding standpoint.  Need to assess his falls risk after.  Back in 2016 he was drinking heavily.  Anticoagulation was not considered.  If he is more steady on his feet after surgery and not drinking heavily, could consider trial of Eliquis.  I believe he would get the 5 mg twice a day dose based on his current renal function and weight.  Since surgery  is coming up and he still is having difficulty with a nonhealing ulcer, will not start any anticoagulation at this time."  Denies : Chest pain. Dizziness. Nitroglycerin use. Orthopnea. Palpitations. Paroxysmal nocturnal dyspnea. Shortness of breath. Syncope.    Biggest issue for him has been his legs.  Legs have improved since iliac stents.  Left leg is still a problem.  Has had issues with poor balance.  Now using a walker more regularly.   Past Medical History:  Diagnosis Date   Acute encephalopathy 05/09/2014   Alcoholism ([place])    Arthritis    Cerebral infarction due to embolism of left middle cerebral artery ([place])    Diabetes mellitus type 2, controlled ([place]) 05/04/2014   Essential hypertension 05/04/2014   Gout 05/04/2014   Hypertension    New onset atrial fibrillation ([place]) 05/04/2014   Psoas abscess ([place]) 05/04/2014   Psoas abscess, right ([place])    SDH (subdural hematoma) ([place]) 05/12/2014   Sepsis ([place]) 05/04/2014   Septic arthritis of hip ([place]) 05/18/2014   Stenosis of right carotid artery    40-59 percent    Streptococcal bacteremia 05/09/2014    Past Surgical History:  Procedure Laterality Date   ABDOMINAL AORTOGRAM W/LOWER EXTREMITY N/A 09/24/2021   Procedure: ABDOMINAL AORTOGRAM W/LOWER EXTREMITY;  Surgeon: [name], MD;  Location: [facility];  Service: Cardiovascular;  Laterality: N/A;   ABDOMINAL AORTOGRAM W/LOWER EXTREMITY N/A 03/18/2022   Procedure: ABDOMINAL AORTOGRAM W/LOWER EXTREMITY;  Surgeon: [name], MD;  Location: [facility];  Service: Cardiovascular;  Laterality: N/A;   AORTOGRAM  10/09/2021   Procedure: AORTOGRAM;  Surgeon: [name], MD;  Location: [facility];  Service: Vascular;;   APPLICATION OF WOUND VAC Bilateral 10/09/2021   Procedure: APPLICATION OF WOUND VAC;  Surgeon: [name], MD;  Location: [facility];  Service: Vascular;  Laterality: Bilateral;   BONE BIOPSY Right 08/13/2021   Procedure: BONE BIOPSY;  Surgeon: Trula Slade, DPM;  Location: [facility];  Service: Podiatry;  Laterality: Right;   ENDARTERECTOMY Bilateral 10/09/2021   Procedure: ILIOFEMORAL ENDARTERECTOMY;  Surgeon: [name], MD;  Location: [facility];  Service: Vascular;  Laterality: Bilateral;   HIP ARTHROPLASTY Right    INSERTION OF ILIAC STENT Bilateral 10/09/2021   Procedure: INSERTION OF RETROGRADE ILIAC STENT;  Surgeon: [name], MD;  Location: [facility];  Service: Vascular;  Laterality: Bilateral;   JOINT REPLACEMENT     right hip, bilateral knees   left femur surgery Left    with rod   right cataract extraction     TONSILLECTOMY     TOTAL SHOULDER ARTHROPLASTY Bilateral    ULTRASOUND GUIDANCE FOR VASCULAR ACCESS Bilateral 10/09/2021   Procedure: ULTRASOUND GUIDANCE FOR VASCULAR ACCESS;  Surgeon: [name], MD;  Location: [facility];  Service: Vascular;  Laterality: Bilateral;   WOUND DEBRIDEMENT Right 08/13/2021   Procedure: DEBRIDEMENT WOUND;  Surgeon: Trula Slade, DPM;  Location: [facility];  Service: Podiatry;  Laterality: Right;     Current Outpatient Medications  Medication Sig Dispense Refill   amoxicillin (AMOXIL) 500 MG capsule TAKE 1 CAPSULE BY MOUTH TWICE A DAY 60 capsule 2   APPLE CIDER VINEGAR PO Take 1 capsule by mouth daily.     atenolol (TENORMIN) 50 MG tablet Take 50 mg by mouth daily.     clopidogrel (PLAVIX) 75 MG tablet TAKE 1 TABLET (75 MG TOTAL) BY MOUTH DAILY AT 6 (SIX) AM. 90 tablet 1   fluticasone (FLONASE) 50 MCG/ACT nasal spray Place 2 sprays into both nostrils daily.     furosemide (LASIX) 20 MG tablet Take 20 mg by mouth daily.     hydrALAZINE (APRESOLINE) 25 MG tablet Take 1 tablet (25 mg total) by mouth every 8 (eight) hours.     irbesartan (AVAPRO) 75 MG tablet Take 75 mg by mouth daily.     LYRICA 75 MG capsule Take 75-150 mg by mouth See admin instructions. Take 75 mg by mouth in the morning and 150 mg at night     meloxicam (MOBIC) 7.5 MG tablet Take 7.5 mg by mouth daily.     metFORMIN  (GLUCOPHAGE) 1000 MG tablet Take 1,000 mg by mouth in the morning and at bedtime.     Multiple Vitamin (MULTIVITAMIN WITH MINERALS) TABS tablet Take 1 tablet by mouth daily.     neomycin-bacitracin-polymyxin (NEOSPORIN) [phone] ointment Apply 1 Application topically daily as needed (wound care).     OVER THE COUNTER MEDICATION Take 1 capsule by mouth daily. Balance of Nature Fruits     OVER THE COUNTER MEDICATION Take 1 tablet by mouth daily. Beet supplement     protein supplement shake (PREMIER PROTEIN) LIQD Take 11 oz by mouth daily.     rosuvastatin (CRESTOR) 20 MG tablet Take 1 tablet (  20 mg total) by mouth daily. 90 tablet 3   SANTYL 250 UNIT/GM ointment Apply 1 Application topically daily.     Tetrahydrozoline HCl (VISINE OP) Place 1 drop into both eyes daily as needed (burning).     traMADol (ULTRAM) 50 MG tablet Take 2 tablets (100 mg total) by mouth every 6 (six) hours as needed. Every 4 hours 30 tablet 0   ULORIC 80 MG TABS Take 80 mg by mouth daily.  3   aspirin EC 81 MG tablet Take 81 mg by mouth every other day. Swallow whole. (Patient not taking: Reported on 03/13/2022)     cefadroxil (DURICEF) 500 MG capsule Take 1 capsule (500 mg total) by mouth 2 (two) times daily. (Patient not taking: Reported on 03/13/2022) 60 capsule 1   clindamycin (CLEOCIN) 150 MG capsule Take 450 mg by mouth 3 (three) times daily. (Patient not taking: Reported on 03/13/2022)     doxycycline (VIBRA-TABS) 100 MG tablet Take 1 tablet (100 mg total) by mouth 2 (two) times daily. (Patient not taking: Reported on 03/13/2022) 60 tablet 1   No current facility-administered medications for this visit.    Allergies:   Allopurinol, Atorvastatin, and Motrin [ibuprofen]    Social History:  The patient  reports that he has quit smoking. His smoking use included cigarettes. He smoked an average of 25 packs per day. He has never been exposed to tobacco smoke. He has never used smokeless tobacco. He reports current alcohol  use. He reports that he does not currently use drugs.   Family History:  The patient's family history includes Diabetes Mellitus II in his daughter.    ROS:  Please see the history of present illness.   Otherwise, review of systems are positive for recent fall with injury to his right shin.   All other systems are reviewed and negative.    PHYSICAL EXAM: VS:  BP 136/68   Pulse 95   Ht 5' 8.5" (1.74 m)   Wt 192 lb 12.8 oz (87.5 kg)   SpO2 99%   BMI 28.89 kg/m  , BMI Body mass index is 28.89 kg/m. GEN: Well nourished, well developed, in no acute distress HEENT: normal Neck: no JVD, carotid bruits, or masses Cardiac: irregularly irregular; no murmurs, rubs, or gallops,no edema  Respiratory:  clear to auscultation bilaterally, normal work of breathing GI: soft, nontender, nondistended, + BS MS: no deformity or atrophy; bandages on lower legs from cuts on skin-one from a fall; LE erythematous bilaterally Skin: warm and dry, no rash Neuro:  Strength and sensation are intact Psych: euthymic mood, full affect   EKG:   The ekg ordered today demonstrates AFib, rate controlled   Recent Labs: 12/19/2021: ALT 13; Platelets 258 03/18/2022: BUN 17; Creatinine, Ser 0.50; Hemoglobin 15.0; Potassium 4.0; Sodium 136   Lipid Panel    Component Value Date/Time   CHOL 120 12/19/2021 1152   TRIG 82 12/19/2021 1152   HDL 57 12/19/2021 1152   CHOLHDL 2.1 12/19/2021 1152   CHOLHDL 2.3 10/10/2021 0054   VLDL 10 10/10/2021 0054   LDLCALC 47 12/19/2021 1152     Other studies Reviewed: Additional studies/ records that were reviewed today with results demonstrating: labs reviewed. Cr normal in March 2024.    ASSESSMENT AND PLAN:  Atrial fibrillation: rate controlled on atenolol. On clopidogrel.  We discussed Eliquis or Xarelto but will keep the clopidogrel.  He was falling more a few years ago.  Still seems higher risk for bleeding.  PAD  is his biggest issue.  [id number] by [facility].   Hyperlipidemia: Rosuvastatin was increased to 20 mg in September 2023. Hypertension: 136/68 on recheck.  Continue current medications.   Current medicines are reviewed at length with the patient today.  The patient concerns regarding his medicines were addressed.  The following changes have been made:  No change  Labs/ tests ordered today include:  No orders of the defined types were placed in this encounter.   Recommend 150 minutes/week of aerobic exercise Low fat, low carb, high fiber diet recommended  Disposition:   FU in 9 months   Signed, Larae Grooms, MD  03/31/2022 4:51 PM    [facility] [place], [place], [place]  [address] Phone: [phone]; Fax: [phone]

## 2022-03-31 ENCOUNTER — Encounter: Payer: Self-pay | Admitting: Interventional Cardiology

## 2022-03-31 ENCOUNTER — Ambulatory Visit: Payer: Medicare Other | Attending: Interventional Cardiology | Admitting: Interventional Cardiology

## 2022-03-31 ENCOUNTER — Encounter (HOSPITAL_BASED_OUTPATIENT_CLINIC_OR_DEPARTMENT_OTHER): Payer: Medicare Other | Attending: Internal Medicine | Admitting: Internal Medicine

## 2022-03-31 VITALS — BP 136/68 | HR 95 | Ht 68.5 in | Wt 192.8 lb

## 2022-03-31 DIAGNOSIS — Z8614 Personal history of Methicillin resistant Staphylococcus aureus infection: Secondary | ICD-10-CM | POA: Diagnosis not present

## 2022-03-31 DIAGNOSIS — E1151 Type 2 diabetes mellitus with diabetic peripheral angiopathy without gangrene: Secondary | ICD-10-CM | POA: Insufficient documentation

## 2022-03-31 DIAGNOSIS — I4819 Other persistent atrial fibrillation: Secondary | ICD-10-CM

## 2022-03-31 DIAGNOSIS — L97514 Non-pressure chronic ulcer of other part of right foot with necrosis of bone: Secondary | ICD-10-CM | POA: Insufficient documentation

## 2022-03-31 DIAGNOSIS — I739 Peripheral vascular disease, unspecified: Secondary | ICD-10-CM

## 2022-03-31 DIAGNOSIS — E11621 Type 2 diabetes mellitus with foot ulcer: Secondary | ICD-10-CM | POA: Insufficient documentation

## 2022-03-31 DIAGNOSIS — E782 Mixed hyperlipidemia: Secondary | ICD-10-CM | POA: Diagnosis not present

## 2022-03-31 DIAGNOSIS — E1142 Type 2 diabetes mellitus with diabetic polyneuropathy: Secondary | ICD-10-CM | POA: Insufficient documentation

## 2022-03-31 DIAGNOSIS — I10 Essential (primary) hypertension: Secondary | ICD-10-CM

## 2022-03-31 DIAGNOSIS — L97919 Non-pressure chronic ulcer of unspecified part of right lower leg with unspecified severity: Secondary | ICD-10-CM | POA: Insufficient documentation

## 2022-03-31 DIAGNOSIS — E1140 Type 2 diabetes mellitus with diabetic neuropathy, unspecified: Secondary | ICD-10-CM | POA: Insufficient documentation

## 2022-03-31 DIAGNOSIS — S81802A Unspecified open wound, left lower leg, initial encounter: Secondary | ICD-10-CM

## 2022-03-31 DIAGNOSIS — E11622 Type 2 diabetes mellitus with other skin ulcer: Secondary | ICD-10-CM | POA: Insufficient documentation

## 2022-03-31 DIAGNOSIS — L97918 Non-pressure chronic ulcer of unspecified part of right lower leg with other specified severity: Secondary | ICD-10-CM | POA: Diagnosis not present

## 2022-03-31 DIAGNOSIS — I70202 Unspecified atherosclerosis of native arteries of extremities, left leg: Secondary | ICD-10-CM | POA: Diagnosis not present

## 2022-03-31 NOTE — Patient Instructions (Signed)
Medication Instructions:  Your physician recommends that you continue on your current medications as directed. Please refer to the Current Medication list given to you today.  *If you need a refill on your cardiac medications before your next appointment, please call your pharmacy*   Lab Work: none If you have labs (blood work) drawn today and your tests are completely normal, you will receive your results only by: MyChart Message (if you have MyChart) OR A paper copy in the mail If you have any lab test that is abnormal or we need to change your treatment, we will call you to review the results.   Testing/Procedures: none   Follow-Up: At [facility], you and your health needs are our priority.  As part of our continuing mission to provide you with exceptional heart care, we have created designated Provider Care Teams.  These Care Teams include your primary Cardiologist (physician) and Advanced Practice Providers (APPs -  Physician Assistants and Nurse Practitioners) who all work together to provide you with the care you need, when you need it.  We recommend signing up for the patient portal called "MyChart".  Sign up information is provided on this After Visit Summary.  MyChart is used to connect with patients for Virtual Visits (Telemedicine).  Patients are able to view lab/test results, encounter notes, upcoming appointments, etc.  Non-urgent messages can be sent to your provider as well.   To learn more about what you can do with MyChart, go to [email or web address].    Your next appointment:   8 -9 month(s)  Provider:   Jayadeep Varanasi, MD     Other Instructions    

## 2022-04-08 NOTE — Progress Notes (Signed)
[name] ([id number]) [id number] Page 1 of 7 Visit Report for 03/31/2022 Arrival Information Details Patient Name: Date of Service: [name], [name] [name]. 03/31/2022 12:30 PM Medical Record Number: [id number] Patient Account Number: [id number] Date of Birth/Sex: Treating RN: [birth year] (87 y.o. M) Primary Care Sumaya Riedesel: Dorthy Cooler, Dibas Other Clinician: Referring Kristie Bracewell: Treating Trent Theisen/Extender: Alanda Slim, Dibas Weeks in Treatment: 4 Visit Information History Since Last Visit Added or deleted any medications: No Patient Arrived: [name] Any new allergies or adverse reactions: No Arrival Time: 12:47 Had a fall or experienced change in No Accompanied By: son activities of daily living that may affect Transfer Assistance: None risk of falls: Patient Identification Verified: Yes Signs or symptoms of abuse/neglect since last visito No Secondary Verification Process Completed: Yes Hospitalized since last visit: No Patient Requires Transmission-Based Precautions: No Implantable device outside of the clinic excluding No Patient Has Alerts: Yes cellular tissue based products placed in the center Patient Alerts: Patient on Blood Thinner since last visit: ABI R=.58 TBI R=[place] Has Dressing in Place as Prescribed: Yes Pain Present Now: No Electronic Signature(s) Signed: 03/31/2022 4:36:36 PM By: Erenest Blank Entered By: Erenest Blank on 03/31/2022 12:51:28 -------------------------------------------------------------------------------- Lower Extremity Assessment Details Patient Name: Date of Service: [name] [name]. 03/31/2022 12:30 PM Medical Record Number: [id number] Patient Account Number: [id number] Date of Birth/Sex: Treating RN: August 13, 1935 (87 y.o. M) Primary Care Jermany Rimel: Dorthy Cooler, Dibas Other Clinician: Referring Devoiry Corriher: Treating Burleigh Brockmann/Extender: Alanda Slim, Dibas Weeks in Treatment: 31 Edema Assessment Assessed:  [Left: No] [Right: No] Edema: [Left: Yes] [Right: Yes] Calf Left: Right: Point of Measurement: 42 cm From Medial Instep 37.5 cm 38 cm Ankle Left: Right: Point of Measurement: 8 cm From Medial Instep 22 cm 23.5 cm Electronic Signature(s) Signed: 03/31/2022 4:36:36 PM By: Erenest Blank Entered By: Erenest Blank on 03/31/2022 13:00:43 -------------------------------------------------------------------------------- Multi Wound Chart Details Patient Name: Date of Service: [name] [name]. 03/31/2022 12:30 PM Medical Record Number: [id number] Patient Account Number: [id number] Date of Birth/Sex: Treating RN: Jul 28, 1935 (87 y.o. M) Primary Care Adebayo Ensminger: Dorthy Cooler, Dibas Other Clinician: Referring Lavaris Sexson: Treating Jawann Urbani/Extender: Alanda Slim, Dibas Weeks in Treatment: [address], [name] ([id number]) [id number] Page 2 of 7 Vital Signs Height(in): 68 Pulse(bpm): 73 Weight(lbs): 198 Blood Pressure(mmHg): 134/67 Body Mass Index(BMI): 30.1 Temperature(F): 98.6 Respiratory Rate(breaths/min): 18 [3:Photos:] [5:No Photos] Right, Anterior Ankle Left, Anterior Ankle Right, Anterior Lower Leg Wound Location: Shear/Friction Gradually Appeared Trauma Wounding Event: Diabetic Wound/Ulcer of the Lower Diabetic Wound/Ulcer of the Lower Abrasion Primary Etiology: Extremity Extremity Pressure Ulcer N/A N/A Secondary Etiology: Cataracts, Angina, Type II Diabetes, Cataracts, Angina, Type II Diabetes, Cataracts, Angina, Type II Diabetes, Comorbid History: Gout, Osteoarthritis, Neuropathy Gout, Osteoarthritis, Neuropathy Gout, Osteoarthritis, Neuropathy 12/04/2021 01/02/2022 03/31/2022 Date Acquired: 16 11 0 Weeks of Treatment: Healed - Epithelialized Open Open Wound Status: No No No Wound Recurrence: 0x0x0 6x2.5x0.1 3.5x1.4x0.1 Measurements Hood x W x D (cm) 0 11.781 3.848 A (cm) : rea 0 1.178 0.385 Volume (cm) : 100.00% -443.40% N/A % Reduction  in A rea: 100.00% -171.40% N/A % Reduction in Volume: Grade 2 Grade 1 Full Thickness With Exposed Support Classification: Structures None Present Medium Medium Exudate A mount: N/A Serosanguineous Serosanguineous Exudate Type: N/A red, brown red, brown Exudate Color: Distinct, outline attached Distinct, outline attached Distinct, outline attached Wound Margin: None Present (0%) Medium (34-66%) Large (67-100%) Granulation A mount: N/A N/A Red, Pink Granulation Quality: None Present (0%) Medium (34-66%) Small (1-33%) Necrotic A mount: N/A Eschar Adherent Slough Necrotic Tissue: Fascia: No  Fascia: No Fat Layer (Subcutaneous Tissue): Yes Exposed Structures: Fat Layer (Subcutaneous Tissue): No Fat Layer (Subcutaneous Tissue): No Fascia: No Tendon: No Tendon: No Tendon: No Muscle: No Muscle: No Muscle: No Joint: No Joint: No Joint: No Bone: No Bone: No Bone: No None None None Epithelialization: N/A N/A Debridement - Selective/Open Wound Debridement: Pre-procedure Verification/Time Out N/A N/A 13:20 Taken: N/A N/A Lidocaine Pain Control: N/A N/A Slough Tissue Debrided: N/A N/A Non-Viable Tissue Level: N/A N/A 4.9 Debridement A (sq cm): rea N/A N/A Curette Instrument: N/A N/A Minimum Bleeding: N/A N/A Pressure Hemostasis A chieved: N/A N/A 0 Procedural Pain: N/A N/A 0 Post Procedural Pain: N/A N/A Procedure was tolerated well Debridement Treatment Response: N/A N/A 3.5x1.4x0.1 Post Debridement Measurements Hood x W x D (cm) N/A N/A 0.385 Post Debridement Volume: (cm) Excoriation: No Excoriation: No Excoriation: No Periwound Skin Texture: Induration: No Induration: No Induration: No Callus: No Callus: No Callus: No Crepitus: No Crepitus: No Crepitus: No Rash: No Rash: No Rash: No Scarring: No Scarring: No Scarring: No Dry/Scaly: Yes Maceration: Yes Maceration: No Periwound Skin Moisture: Maceration: No Dry/Scaly: No Dry/Scaly:  No Atrophie Blanche: No Erythema: Yes Atrophie Blanche: No Periwound Skin Color: Cyanosis: No Atrophie Blanche: No Cyanosis: No Ecchymosis: No Cyanosis: No Ecchymosis: No Erythema: No Ecchymosis: No Erythema: No Hemosiderin Staining: No Hemosiderin Staining: No Hemosiderin Staining: No Mottled: No Mottled: No Mottled: No Pallor: No Pallor: No Pallor: No [name], [name] ([id number]) [phone].pdf Page 3 of 7 Rubor: No Rubor: No Rubor: No N/A No Abnormality No Abnormality Temperature: N/A Yes Yes Tenderness on Palpation: N/A N/A Debridement Procedures Performed: Treatment Notes Electronic Signature(s) Signed: 03/31/2022 3:28:36 PM By: Kalman Shan DO Entered By: Kalman Shan on 03/31/2022 13:26:34 -------------------------------------------------------------------------------- Multi-Disciplinary Care Plan Details Patient Name: Date of Service: [name] [name]. 03/31/2022 12:30 PM Medical Record Number: [id number] Patient Account Number: [id number] Date of Birth/Sex: Treating RN: September 02, 1935 (87 y.o. [name] Primary Care Alyna Stensland: Dorthy Cooler, Dibas Other Clinician: Referring Samauri Kellenberger: Treating Chasyn Cinque/Extender: Alanda Slim, Dibas Weeks in Treatment: 31 Active Inactive Wound/Skin Impairment Nursing Diagnoses: Impaired tissue integrity Goals: Patient/caregiver will verbalize understanding of skin care regimen Date Initiated: 08/26/2021 Target Resolution Date: 05/01/2022 Goal Status: Active Ulcer/skin breakdown will have a volume reduction of 30% by week 4 Date Initiated: 08/26/2021 Target Resolution Date: 01/03/2022 Goal Status: Active Interventions: Assess patient/caregiver ability to obtain necessary supplies Assess patient/caregiver ability to perform ulcer/skin care regimen upon admission and as needed Assess ulceration(s) every visit Provide education on ulcer and skin care Treatment Activities: Topical  wound management initiated : 08/26/2021 Notes: Electronic Signature(s) Signed: 04/08/2022 4:26:45 PM By: Rhae Hammock RN Entered By: Rhae Hammock on 03/31/2022 13:06:06 -------------------------------------------------------------------------------- Pain Assessment Details Patient Name: Date of Service: [name] [name]. 03/31/2022 12:30 PM Medical Record Number: [id number] Patient Account Number: [id number] Date of Birth/Sex: Treating RN: 1935-04-21 (87 y.o. M) Primary Care Kaine Mcquillen: Dorthy Cooler, Dibas Other Clinician: Referring Rivan Siordia: Treating Pate Aylward/Extender: Alanda Slim, Dibas Weeks in Treatment: 31 Active Problems Location of Pain Severity and Description of Pain Patient Has Paino No Site Locations [name], [name] ([id number]) [id number] Page 4 of 7 Pain Management and Medication Current Pain Management: Electronic Signature(s) Signed: 03/31/2022 4:36:36 PM By: Erenest Blank Entered By: Erenest Blank on 03/31/2022 12:55:13 -------------------------------------------------------------------------------- Patient/Caregiver Education Details Patient Name: Date of Service: [name] [name]. 3/26/2024andnbsp12:30 PM Medical Record Number: [id number] Patient Account Number: [id number] Date of Birth/Gender: Treating RN: [birth year] (87 y.o. [name] Primary Care Physician: Dorthy Cooler, Dibas Other  Clinician: Referring Physician: Treating Physician/Extender: Alanda Slim, Dibas Weeks in Treatment: 93 Education Assessment Education Provided To: Patient Education Topics Provided Wound/Skin Impairment: Methods: Explain/Verbal Responses: Reinforcements needed, State content correctly Electronic Signature(s) Signed: 04/08/2022 4:26:45 PM By: Rhae Hammock RN Entered By: Rhae Hammock on 03/31/2022 13:07:14 -------------------------------------------------------------------------------- Wound Assessment  Details Patient Name: Date of Service: [name] [name]. 03/31/2022 12:30 PM Medical Record Number: [id number] Patient Account Number: [id number] Date of Birth/Sex: Treating RN: 01/09/1935 (87 y.o. [name] Primary Care Kaliyan Osbourn: Dorthy Cooler, Dibas Other Clinician: Referring Eartha Vonbehren: Treating Kalab Camps/Extender: Alanda Slim, Dibas Weeks in Treatment: 31 Wound Status Wound Number: 3 Primary Etiology: Diabetic Wound/Ulcer of the Lower Extremity Wound Location: Right, Anterior Ankle Secondary Pressure Ulcer Etiology: Wounding Event: Shear/Friction Wound Status: Healed - Epithelialized Date Acquired: 12/04/2021 Comorbid Cataracts, Angina, Type II Diabetes, Gout, Osteoarthritis, Weeks Of Treatment: 16 History: Neuropathy Clustered Wound: No [name], [name] ([id number] Page 5 of 7 Photos Wound Measurements Length: (cm) Width: (cm) Depth: (cm) Area: (cm) Volume: (cm) 0 % Reduction in Area: 100% 0 % Reduction in Volume: 100% 0 Epithelialization: None 0 Tunneling: No 0 Undermining: No Wound Description Classification: Grade 2 Wound Margin: Distinct, outline attached Exudate Amount: None Present Foul Odor After Cleansing: No Slough/Fibrino No Wound Bed Granulation Amount: None Present (0%) Exposed Structure Necrotic Amount: None Present (0%) Fascia Exposed: No Fat Layer (Subcutaneous Tissue) Exposed: No Tendon Exposed: No Muscle Exposed: No Joint Exposed: No Bone Exposed: No Periwound Skin Texture Texture Color No Abnormalities Noted: No No Abnormalities Noted: No Callus: No Atrophie Blanche: No Crepitus: No Cyanosis: No Excoriation: No Ecchymosis: No Induration: No Erythema: No Rash: No Hemosiderin Staining: No Scarring: No Mottled: No Pallor: No Moisture Rubor: No No Abnormalities Noted: No Dry / Scaly: Yes Maceration: No Electronic Signature(s) Signed: 04/08/2022 4:26:45 PM By: Rhae Hammock  RN Entered By: Rhae Hammock on 03/31/2022 13:20:28 -------------------------------------------------------------------------------- Wound Assessment Details Patient Name: Date of Service: [name] [name]. 03/31/2022 12:30 PM Medical Record Number: [id number] Patient Account Number: [id number] Date of Birth/Sex: Treating RN: September 30, 1935 (87 y.o. M) Primary Care Jasie Meleski: Dorthy Cooler, Dibas Other Clinician: Referring Tel [name]: Treating Jahson Emanuele/Extender: Alanda Slim, Dibas Weeks in Treatment: 31 Wound Status Wound Number: 4 Primary Diabetic Wound/Ulcer of the Lower Extremity Etiology: Wound Location: Left, Anterior Ankle Wound Status: Open Wounding Event: Gradually Appeared Comorbid Cataracts, Angina, Type II Diabetes, Gout, Osteoarthritis, Date Acquired: 01/02/2022 History: Neuropathy Weeks Of Treatment: 11 Clustered Wound: No [name], [name] ([id number]) [phone].pdf Page 6 of 7 Photos Wound Measurements Length: (cm) 6 Width: (cm) 2.5 Depth: (cm) 0.1 Area: (cm) 11.781 Volume: (cm) 1.178 % Reduction in Area: -443.4% % Reduction in Volume: -171.4% Epithelialization: None Tunneling: No Undermining: No Wound Description Classification: Grade 1 Wound Margin: Distinct, outline attached Exudate Amount: Medium Exudate Type: Serosanguineous Exudate Color: red, brown Foul Odor After Cleansing: No Slough/Fibrino Yes Wound Bed Granulation Amount: Medium (34-66%) Exposed Structure Necrotic Amount: Medium (34-66%) Fascia Exposed: No Necrotic Quality: Eschar Fat Layer (Subcutaneous Tissue) Exposed: No Tendon Exposed: No Muscle Exposed: No Joint Exposed: No Bone Exposed: No Periwound Skin Texture Texture Color No Abnormalities Noted: No No Abnormalities Noted: No Callus: No Atrophie Blanche: No Crepitus: No Cyanosis: No Excoriation: No Ecchymosis: No Induration: No Erythema: Yes Rash: No Hemosiderin Staining: No Scarring:  No Mottled: No Pallor: No Moisture Rubor: No No Abnormalities Noted: No Dry / Scaly: No Temperature / Pain Maceration: Yes Temperature: No Abnormality Tenderness on Palpation: Yes Electronic Signature(s) Signed: 03/31/2022 4:36:36 PM By:  Erenest Blank Entered By: Erenest Blank on 03/31/2022 13:08:18 -------------------------------------------------------------------------------- Wound Assessment Details Patient Name: Date of Service: [name], Kenneth [name]. 03/31/2022 12:30 PM Medical Record Number: [id number] Patient Account Number: [id number] Date of Birth/Sex: Treating RN: Jul 28, 1935 (87 y.o. [name] Primary Care Tami Barren: Dorthy Cooler, Dibas Other Clinician: Referring Hartley Urton: Treating Keviana Guida/Extender: Alanda Slim, Dibas Weeks in Treatment: 31 Wound Status Wound Number: 5 Primary Abrasion Etiology: Wound Location: Right, Anterior Lower Leg Wound Status: Open [name], [name] ([id number]) [phone].pdf Page 7 of 7 Wound Status: Open Wounding Event: Trauma Comorbid Cataracts, Angina, Type II Diabetes, Gout, Osteoarthritis, Date Acquired: 03/31/2022 History: Neuropathy Weeks Of Treatment: 0 Clustered Wound: No Wound Measurements Length: (cm) 3.5 Width: (cm) 1.4 Depth: (cm) 0.1 Area: (cm) 3.848 Volume: (cm) 0.385 % Reduction in Area: % Reduction in Volume: Epithelialization: None Tunneling: No Undermining: No Wound Description Classification: Full Thickness With Exposed Support Structures Wound Margin: Distinct, outline attached Exudate Amount: Medium Exudate Type: Serosanguineous Exudate Color: red, brown Foul Odor After Cleansing: No Slough/Fibrino Yes Wound Bed Granulation Amount: Large (67-100%) Exposed Structure Granulation Quality: Red, Pink Fascia Exposed: No Necrotic Amount: Small (1-33%) Fat Layer (Subcutaneous Tissue) Exposed: Yes Necrotic Quality: Adherent Slough Tendon Exposed: No Muscle Exposed:  No Joint Exposed: No Bone Exposed: No Periwound Skin Texture Texture Color No Abnormalities Noted: No No Abnormalities Noted: No Callus: No Atrophie Blanche: No Crepitus: No Cyanosis: No Excoriation: No Ecchymosis: No Induration: No Erythema: No Rash: No Hemosiderin Staining: No Scarring: No Mottled: No Pallor: No Moisture Rubor: No No Abnormalities Noted: No Dry / Scaly: No Temperature / Pain Maceration: No Temperature: No Abnormality Tenderness on Palpation: Yes Electronic Signature(s) Signed: 04/08/2022 4:26:45 PM By: Rhae Hammock RN Entered By: Rhae Hammock on 03/31/2022 13:13:25 -------------------------------------------------------------------------------- Vitals Details Patient Name: Date of Service: [name] [name]. 03/31/2022 12:30 PM Medical Record Number: [id number] Patient Account Number: [id number] Date of Birth/Sex: Treating RN: 10/31/1935 (87 y.o. M) Primary Care Priya Matsen: Dorthy Cooler, Dibas Other Clinician: Referring Afifa Truax: Treating Verl Kitson/Extender: Alanda Slim, Dibas Weeks in Treatment: 31 Vital Signs Time Taken: 12:54 Temperature (F): 98.6 Height (in): 68 Pulse (bpm): 73 Weight (lbs): 198 Respiratory Rate (breaths/min): 18 Body Mass Index (BMI): 30.1 Blood Pressure (mmHg): 134/67 Reference Range: 80 - 120 mg / dl Electronic Signature(s) Signed: 03/31/2022 4:36:36 PM By: Erenest Blank Entered By: Erenest Blank on 03/31/2022 12:55:06

## 2022-04-08 NOTE — Progress Notes (Signed)
TILLIE, VELDKAMP ([id number]) [id number] Page 1 of 9 Visit Report for 03/31/2022 Chief Complaint Document Details Patient Name: Date of Service: Kenneth, Hood Kenneth L. 03/31/2022 12:30 PM Medical Record Number: [id number] Patient Account Number: [id number] Date of Birth/Sex: Treating RN: [birth year] (87 y.o. M) Primary Care Provider: Dorthy Hood, Kenneth Other Clinician: Referring Provider: Treating Provider/Extender: [name], [name] in Treatment: 31 Information Obtained from: Patient Chief Complaint right foot wound, Right leg wound and left leg wound Electronic Signature(s) Signed: 03/31/2022 3:28:36 PM By: Kenneth Shan DO Entered By: [name] on 03/31/2022 13:26:42 -------------------------------------------------------------------------------- Debridement Details Patient Name: Date of Service: Kenneth Mons Kenneth L. 03/31/2022 12:30 PM Medical Record Number: [id number] Patient Account Number: [id number] Date of Birth/Sex: Treating RN: 07/16/1935 (87 y.o. [name], [name] Primary Care Provider: Dorthy Hood, Kenneth Other Clinician: Referring Provider: Treating Provider/Extender: [name], [name] in Treatment: 31 Debridement Performed for Assessment: Wound #5 Right,Anterior Lower Leg Performed By: Physician Kenneth Shan, DO Debridement Type: Debridement Level of Consciousness (Pre-procedure): Awake and Alert Pre-procedure Verification/Time Out Yes - 13:20 Taken: Start Time: 13:20 Pain Control: Lidocaine T Area Debrided (L x W): otal 3.5 (cm) x 1.4 (cm) = 4.9 (cm) Tissue and other material debrided: Viable, Non-Viable, Slough, Slough Level: Non-Viable Tissue Debridement Description: Selective/Open Wound Instrument: Curette Bleeding: Minimum Hemostasis Achieved: Pressure End Time: 13:20 Procedural Pain: 0 Post Procedural Pain: 0 Response to Treatment: Procedure was tolerated well Level of Consciousness (Post-  Awake and Alert procedure): Post Debridement Measurements of Total Wound Length: (cm) 3.5 Width: (cm) 1.4 Depth: (cm) 0.1 Volume: (cm) 0.385 Character of Wound/Ulcer Post Debridement: Improved Post Procedure Diagnosis Same as Pre-procedure Electronic Signature(s) Signed: 03/31/2022 3:28:36 PM By: Kenneth Shan DO Signed: 04/08/2022 4:26:45 PM By: Kenneth Hammock RN Entered By: [name] on 03/31/2022 13:21:39 [name], [name] ([id number] Page 2 of 9 -------------------------------------------------------------------------------- HPI Details Patient Name: Date of Service: [name] Kenneth L. 03/31/2022 12:30 PM Medical Record Number: [id number] Patient Account Number: [id number] Date of Birth/Sex: Treating RN: [birth year] (87 y.o. M) Primary Care Provider: Dorthy Hood, Kenneth Other Clinician: Referring Provider: Treating Provider/Extender: [name], [name] in Treatment: 31 History of Present Illness HPI Description: Admission 08/26/2021 [name] is an 87 year old male with a past medical history of controlled type 2 diabetes on oral agents with peripheral neuropathy and Atrial fibrillation not on blood thinners that presents to the clinic with a right foot wound that has waxed and waned in healing over the past year. He has peripheral neuropathy and is not sure when the wound started or how it started. He had an MRI done by his primary care physician on 08/03/2021 that showed potential early acute osteomyelitis at the posterior calcaneus. He was seen by [name] on 08/06/2021 and started on doxycycline and cefadroxil due to these findings. He was evaluated by podiatry, [name] and had a bone biopsy done on 08/13/2021 that did not show evidence of osteomyelitis. He had a culture that showed MRSA sensitive to doxycycline. He also had ABIs with TBI's done on 8/2 that showed an ABI of 0.58 and a noncompressible TBI. He is  scheduled to see vein and vascular on 8/30. Currently he is keeping the wound covered. He is using a soft surgical shoe. He denies signs of infection. 8/5; patient presents for follow-up. He had follow-up with [name] On 8/23. Plan is for 6 Hood of doxycycline through September 16. Although path was negative for osteo there is still concern due to positive  culture from the bone. Unfortunately patient missed his vein and vascular appointment to assess blood flow status. This has been rescheduled for 9/8. Patient has been using Medihoney to the wound bed. He tries to offload the wound bed but has trouble at night keeping pressure off of it. He does not have Prevalon boots. He currently denies signs of infection. 9/19; patient presents for follow-up. He is scheduled to have a bilateral lower extremity angiogram on 9/20. He currently denies signs of infection. He has been using Medihoney to the wound bed. 11/13; patient has missed his last follow-up. In fact he has not been here in 2 months. He had bilateral iliofemoral endarterectomy with retrograde iliac stenting on 10/09/2021. He has been using Medihoney with Hydrofera Blue to the heel wound. He reports improvement in wound healing here. 2 Hood ago he developed a wound to the dorsal aspect of the great toe. He states His shoes cause this issue and he is currently wearing different shoes. He is on clindamycin as there was concern for infection to the right great toe. He currently denies signs of infection. He has been keeping the great toe open to air. 12/4; patient presents for follow-up. He has been using Medihoney and Hydrofera Blue to the heel wound and right great toe wound. The heel wound is closed. The right great toe wound has bone exposed. He now has a new wound to the right ankle that he states he used to shoehorn to scratch the area and created the wound. He has not been dressing this area. 12/21; patient presents for follow-up. He has  been using Hydrofera Blue and Medihoney to the right anterior leg wound and right toe wound. There is been improvement in wound healing. He has been taking doxycycline prescribed at last clinic visit. He has no issues or complaints today. 1/5; patient presents for follow-up. He has been using Hydrofera Blue and Medihoney to the right anterior leg wound and right toe wound. Unfortunately has developed a new wound to the left lower extremity. He is not sure how this started. He has completed his course of doxycycline. He denies signs of infection. He is scheduled to see vein and vascular for follow-up on 1/12. 1/18; patient presents for follow-up. He saw vein and vascular on 1/12 for evaluation of a new wound to his left lower extremity. They recommended he continue to elevate his legs. They stated that if the wound became worse or did not improve he will need repeat angiogram. Currently patient denies signs of infection. He has been using Medihoney and Hydrofera Blue to the wound beds. 2/6; patient missed his last clinic appointment. He has been using Medihoney and Hydrofera Blue to the wound beds. He saw vein and vascular on 2/2. No plan for angiography at this time. Patient has no issues or complaints today. 2/20; patient presents for follow-up. He has been using Santyl and Hydrofera Blue to the wound beds however he admits to not dressing the wound beds consistently and leaving them open to air. He denies signs of infection. 3/26; patient presents for follow-up. He had a left lower extremity arteriogram on 3/13. 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 It was recommended that patient would benefit from redo left common femoral artery exposure, femoral to below-knee popliteal artery bypass. At this time patient is going to think about it. He has follow-up with vein and vascular next month. He has been using Santyl and Hydrofera Blue to the wound  bed. He has  developed a new skin tear to his right knee after a fall. Electronic Signature(s) Signed: 03/31/2022 3:28:36 PM By: Kenneth Shan DO Entered By: [name] on 03/31/2022 13:27:21 -------------------------------------------------------------------------------- Physical Exam Details Patient Name: Date of Service: Kenneth Mons Kenneth L. 03/31/2022 12:30 PM Medical Record Number: [id number] Patient Account Number: [id number] Date of Birth/Sex: Treating RN: March 04, 1935 (87 y.o. M) Primary Care Provider: Dorthy Hood, Kenneth Other Clinician: Referring Provider: Treating Provider/Extender: [name], [name] in Treatment: 31 Constitutional respirations regular, non-labored and within target range for patient.Marland [facility] [name], [name] ([id number]) [id number] Page 3 of 9 pleasant and cooperative. Notes T the right anterior ankle crease there is epithelialization to the previous wound site. T the right knee there is a skin tear with non viable tissue and granulation o o tissue. Wound to the left anterior leg with Mostly non viable tissue. No signs of infection to any of the wound beds. Electronic Signature(s) Signed: 03/31/2022 3:28:36 PM By: Kenneth Shan DO Entered By: [name] on 03/31/2022 13:28:38 -------------------------------------------------------------------------------- Physician Orders Details Patient Name: Date of Service: Kenneth Mons Kenneth L. 03/31/2022 12:30 PM Medical Record Number: [id number] Patient Account Number: [id number] Date of Birth/Sex: Treating RN: [birth year] (87 y.o. [name], [name] Primary Care Provider: Dorthy Hood, Kenneth Other Clinician: Referring Provider: Treating Provider/Extender: [name], [name] in Treatment: 58 Verbal / Phone Orders: No Diagnosis Coding ICD-10 Coding Code Description E11.621 Type 2 diabetes mellitus with foot ulcer I73.9 Peripheral vascular disease,  unspecified M86.171 Other acute osteomyelitis, right ankle and foot E11.40 Type 2 diabetes mellitus with diabetic neuropathy, unspecified L97.514 Non-pressure chronic ulcer of other part of right foot with necrosis of bone L97.918 Non-pressure chronic ulcer of unspecified part of right lower leg with other specified severity S81.802A Unspecified open wound, left lower leg, initial encounter Follow-up Appointments Return appointment in 3 Hood. - W/ Dr. [place] any day Other: - F/U with Vein and Vascular if wounds get worse. pick up santyl from pharmacy. Anesthetic (In clinic) Topical Lidocaine 5% applied to wound bed (In clinic) Topical Lidocaine 4% applied to wound bed Bathing/ Shower/ Hygiene May shower and wash wound with soap and water. Edema Control - Lymphedema / SCD / Other Elevate legs to the level of the heart or above for 30 minutes daily and/or when sitting for 3-4 times a day throughout the day. Avoid standing for long periods of time. Moisturize legs daily. Off-Loading Open toe surgical shoe to: - Wear socks with your shoes Other: - -Keep pressure off area as much as is possible wear while resting in bed or chair- Prevalon Boot do not walk or stand with it. Additional Orders / Instructions Follow Nutritious Diet Wound Treatment Wound #4 - Ankle Wound Laterality: Left, Anterior Cleanser: Soap and Water 1 x Per Day/30 Days Discharge Instructions: May shower and wash wound with dial antibacterial soap and water prior to dressing change. Peri-Wound Care: Skin Prep (Generic) 1 x Per Day/30 Days Discharge Instructions: Use skin prep as directed Prim Dressing: Santyl Ointment 1 x Per Day/30 Days ary Discharge Instructions: Apply nickel thick amount to wound bed as instructed Prim Dressing: Hydrofera Blue Ready Foam, 2.5 x2.5 in (Generic) 1 x Per Day/30 Days ary Discharge Instructions: Apply to wound bed as instructed Secondary Dressing: Woven Gauze Sponges 2x2 in 1 x Per  Day/30 Days [name], [name] ([id number]) [phone].pdf Page 4 of 9 Discharge Instructions: Apply over primary dressing as directed. Secured With: 66M Medipore H Soft Cloth Surgical T ape,  4 x 10 (in/yd) 1 x Per Day/30 Days Discharge Instructions: Secure with tape as directed. Add-Ons: Cotton Tip Swabs (Generic) 1 x Per Day/30 Days Wound #5 - Lower Leg Wound Laterality: Right, Anterior Cleanser: Soap and Water 1 x Per Day/30 Days Discharge Instructions: May shower and wash wound with dial antibacterial soap and water prior to dressing change. Peri-Wound Care: Skin Prep (Generic) 1 x Per Day/30 Days Discharge Instructions: Use skin prep as directed Prim Dressing: MediHoney Gel, tube 1.5 (oz) 1 x Per Day/30 Days ary Discharge Instructions: Apply to wound bed as instructed Prim Dressing: Hydrofera Blue Ready Foam, 2.5 x2.5 in (Generic) 1 x Per Day/30 Days ary Discharge Instructions: Apply to wound bed as instructed Secondary Dressing: Woven Gauze Sponges 2x2 in 1 x Per Day/30 Days Discharge Instructions: Apply over primary dressing as directed. Secured With: 5M Medipore H Soft Cloth Surgical T ape, 4 x 10 (in/yd) 1 x Per Day/30 Days Discharge Instructions: Secure with tape as directed. Add-Ons: Cotton Tip Swabs (Generic) 1 x Per Day/30 Days Electronic Signature(s) Signed: 03/31/2022 3:28:36 PM By: Kenneth Shan DO Entered By: [name] on 03/31/2022 13:29:11 -------------------------------------------------------------------------------- Problem List Details Patient Name: Date of Service: Kenneth Mons Kenneth L. 03/31/2022 12:30 PM Medical Record Number: [id number] Patient Account Number: [id number] Date of Birth/Sex: Treating RN: 13-Nov-1935 (87 y.o. M) Primary Care Provider: Dorthy Hood, Kenneth Other Clinician: Referring Provider: Treating Provider/Extender: [name], [name] in Treatment: 31 Active Problems ICD-10 Encounter Code Description  Active Date MDM Diagnosis E11.621 Type 2 diabetes mellitus with foot ulcer 08/26/2021 No Yes I73.9 Peripheral vascular disease, unspecified 08/26/2021 No Yes M86.171 Other acute osteomyelitis, right ankle and foot 08/26/2021 No Yes E11.40 Type 2 diabetes mellitus with diabetic neuropathy, unspecified 08/26/2021 No Yes L97.514 Non-pressure chronic ulcer of other part of right foot with necrosis of bone 12/08/2021 No Yes L97.918 Non-pressure chronic ulcer of unspecified part of right lower leg with other 12/25/2021 No Yes specified severity [name], [name] ([id number]) [phone].pdf Page 5 of 9 [phone] Unspecified open wound, left lower leg, initial encounter 01/09/2022 No Yes Inactive Problems Resolved Problems Electronic Signature(s) Signed: 03/31/2022 3:28:36 PM By: Kenneth Shan DO Entered By: [name] on 03/31/2022 13:26:26 -------------------------------------------------------------------------------- Progress Note Details Patient Name: Date of Service: Kenneth Mons Kenneth L. 03/31/2022 12:30 PM Medical Record Number: [id number] Patient Account Number: [id number] Date of Birth/Sex: Treating RN: 1935/12/09 (87 y.o. M) Primary Care Provider: Dorthy Hood, Kenneth Other Clinician: Referring Provider: Treating Provider/Extender: [name], [name] in Treatment: 31 Subjective Chief Complaint Information obtained from Patient right foot wound, Right leg wound and left leg wound History of Present Illness (HPI) Admission 08/26/2021 [name] is an 87 year old male with a past medical history of controlled type 2 diabetes on oral agents with peripheral neuropathy and Atrial fibrillation not on blood thinners that presents to the clinic with a right foot wound that has waxed and waned in healing over the past year. He has peripheral neuropathy and is not sure when the wound started or how it started. He had an MRI done by his primary care physician  on 08/03/2021 that showed potential early acute osteomyelitis at the posterior calcaneus. He was seen by [name] on 08/06/2021 and started on doxycycline and cefadroxil due to these findings. He was evaluated by podiatry, [name] and had a bone biopsy done on 08/13/2021 that did not show evidence of osteomyelitis. He had a culture that showed MRSA sensitive to doxycycline. He also had ABIs with TBI's  done on 8/2 that showed an ABI of 0.58 and a noncompressible TBI. He is scheduled to see vein and vascular on 8/30. Currently he is keeping the wound covered. He is using a soft surgical shoe. He denies signs of infection. 8/5; patient presents for follow-up. He had follow-up with [name] On 8/23. Plan is for 6 Hood of doxycycline through September 16. Although path was negative for osteo there is still concern due to positive culture from the bone. Unfortunately patient missed his vein and vascular appointment to assess blood flow status. This has been rescheduled for 9/8. Patient has been using Medihoney to the wound bed. He tries to offload the wound bed but has trouble at night keeping pressure off of it. He does not have Prevalon boots. He currently denies signs of infection. 9/19; patient presents for follow-up. He is scheduled to have a bilateral lower extremity angiogram on 9/20. He currently denies signs of infection. He has been using Medihoney to the wound bed. 11/13; patient has missed his last follow-up. In fact he has not been here in 2 months. He had bilateral iliofemoral endarterectomy with retrograde iliac stenting on 10/09/2021. He has been using Medihoney with Hydrofera Blue to the heel wound. He reports improvement in wound healing here. 2 Hood ago he developed a wound to the dorsal aspect of the great toe. He states His shoes cause this issue and he is currently wearing different shoes. He is on clindamycin as there was concern for infection to the right great toe. He currently  denies signs of infection. He has been keeping the great toe open to air. 12/4; patient presents for follow-up. He has been using Medihoney and Hydrofera Blue to the heel wound and right great toe wound. The heel wound is closed. The right great toe wound has bone exposed. He now has a new wound to the right ankle that he states he used to shoehorn to scratch the area and created the wound. He has not been dressing this area. 12/21; patient presents for follow-up. He has been using Hydrofera Blue and Medihoney to the right anterior leg wound and right toe wound. There is been improvement in wound healing. He has been taking doxycycline prescribed at last clinic visit. He has no issues or complaints today. 1/5; patient presents for follow-up. He has been using Hydrofera Blue and Medihoney to the right anterior leg wound and right toe wound. Unfortunately has developed a new wound to the left lower extremity. He is not sure how this started. He has completed his course of doxycycline. He denies signs of infection. He is scheduled to see vein and vascular for follow-up on 1/12. 1/18; patient presents for follow-up. He saw vein and vascular on 1/12 for evaluation of a new wound to his left lower extremity. They recommended he continue to elevate his legs. They stated that if the wound became worse or did not improve he will need repeat angiogram. Currently patient denies signs of infection. He has been using Medihoney and Hydrofera Blue to the wound beds. 2/6; patient missed his last clinic appointment. He has been using Medihoney and Hydrofera Blue to the wound beds. He saw vein and vascular on 2/2. No plan for angiography at this time. Patient has no issues or complaints today. 2/20; patient presents for follow-up. He has been using Santyl and Hydrofera Blue to the wound beds however he admits to not dressing the wound beds consistently and leaving them open to air. He denies signs  of infection. 3/26;  patient presents for follow-up. He had a left lower extremity arteriogram on 3/13. 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 It was recommended that patient would benefit from redo left common femoral artery exposure, femoral to below-knee popliteal artery bypass. At this time patient is going to think about it. He has follow-up with vein and vascular next month. He has been using Santyl and Hydrofera Blue to the wound bed. He has developed a new skin tear to his right knee after a fall. Patient History [name], [name] ([id number]) [id number] Page 6 of 9 Information obtained from Patient, Chart. Family History Diabetes - Child, Stroke - Mother, No family history of Cancer, Heart Disease, Hereditary Spherocytosis, Hypertension, Kidney Disease, Lung Disease, Seizures, Thyroid Problems, Tuberculosis. Social History Former smoker, Marital Status - Divorced, Alcohol Use - Moderate, Drug Use - Prior History, Caffeine Use - Daily. Medical History Eyes Patient has history of Cataracts - Surgery Cardiovascular Patient has history of Angina - AFib Endocrine Patient has history of Type II Diabetes Musculoskeletal Patient has history of Gout, Osteoarthritis Denies history of Rheumatoid Arthritis, Osteomyelitis Neurologic Patient has history of Neuropathy Hospitalization/Surgery History - inpatient 09/2021 arterial stenting on legs.. Medical A Surgical History Notes nd Cardiovascular Hx CVA Objective Constitutional respirations regular, non-labored and within target range for patient.. Vitals Time Taken: 12:54 PM, Height: 68 in, Weight: 198 lbs, BMI: 30.1, Temperature: 98.6 F, Pulse: 73 bpm, Respiratory Rate: 18 breaths/min, Blood Pressure: 134/67 mmHg. Psychiatric pleasant and cooperative. General Notes: T the right anterior ankle crease there is epithelialization to the previous wound site. T the right knee there is a skin tear  with non viable tissue o o and granulation tissue. Wound to the left anterior leg with Mostly non viable tissue. No signs of infection to any of the wound beds. Integumentary (Hair, Skin) Wound #3 status is Healed - Epithelialized. Original cause of wound was Shear/Friction. The date acquired was: 12/04/2021. The wound has been in treatment 16 Hood. The wound is located on the Right,Anterior Ankle. The wound measures 0cm length x 0cm width x 0cm depth; 0cm^2 area and 0cm^3 volume. There is no tunneling or undermining noted. There is a none present amount of drainage noted. The wound margin is distinct with the outline attached to the wound base. There is no granulation within the wound bed. There is no necrotic tissue within the wound bed. The periwound skin appearance exhibited: Dry/Scaly. The periwound skin appearance did not exhibit: Callus, Crepitus, Excoriation, Induration, Rash, Scarring, Maceration, Atrophie Blanche, Cyanosis, Ecchymosis, Hemosiderin Staining, Mottled, Pallor, Rubor, Erythema. Wound #4 status is Open. Original cause of wound was Gradually Appeared. The date acquired was: 01/02/2022. The wound has been in treatment 11 Hood. The wound is located on the Left,Anterior Ankle. The wound measures 6cm length x 2.5cm width x 0.1cm depth; 11.781cm^2 area and 1.178cm^3 volume. There is no tunneling or undermining noted. There is a medium amount of serosanguineous drainage noted. The wound margin is distinct with the outline attached to the wound base. There is medium (34-66%) granulation within the wound bed. There is a medium (34-66%) amount of necrotic tissue within the wound bed including Eschar. The periwound skin appearance exhibited: Maceration, Erythema. The periwound skin appearance did not exhibit: Callus, Crepitus, Excoriation, Induration, Rash, Scarring, Dry/Scaly, Atrophie Blanche, Cyanosis, Ecchymosis, Hemosiderin Staining, Mottled, Pallor, Rubor. The surrounding wound  skin color is noted with erythema. Periwound temperature was noted as No Abnormality. The periwound has tenderness  on palpation. Wound #5 status is Open. Original cause of wound was Trauma. The date acquired was: 03/31/2022. The wound is located on the Right,Anterior Lower Leg. The wound measures 3.5cm length x 1.4cm width x 0.1cm depth; 3.848cm^2 area and 0.385cm^3 volume. There is Fat Layer (Subcutaneous Tissue) exposed. There is no tunneling or undermining noted. There is a medium amount of serosanguineous drainage noted. The wound margin is distinct with the outline attached to the wound base. There is large (67-100%) red, pink granulation within the wound bed. There is a small (1-33%) amount of necrotic tissue within the wound bed including Adherent Slough. The periwound skin appearance did not exhibit: Callus, Crepitus, Excoriation, Induration, Rash, Scarring, Dry/Scaly, Maceration, Atrophie Blanche, Cyanosis, Ecchymosis, Hemosiderin Staining, Mottled, Pallor, Rubor, Erythema. Periwound temperature was noted as No Abnormality. The periwound has tenderness on palpation. Assessment Active Problems ICD-10 Type 2 diabetes mellitus with foot ulcer Peripheral vascular disease, unspecified Other acute osteomyelitis, right ankle and foot [name], [name] ([id number]) [phone].pdf Page 7 of 9 Type 2 diabetes mellitus with diabetic neuropathy, unspecified Non-pressure chronic ulcer of other part of right foot with necrosis of bone Non-pressure chronic ulcer of unspecified part of right lower leg with other specified severity Unspecified open wound, left lower leg, initial encounter Patient's wounds are stable. The anterior right leg wound is healed. Unfortunately has developed a new wound to the right knee secondary to a fall. I debrided nonviable tissue here. I recommended Medihoney and Hydrofera Blue to the right knee. Continue Santyl and Hydrofera Blue to the left anterior  leg wound. He admits that he has not changed the dressing often. It is unclear exactly how much wound care he is doing. We will try and get him home health. He follows up with vein and vascular in a couple Hood I will see him after. Procedures Wound #5 Pre-procedure diagnosis of Wound #5 is an Abrasion located on the Right,Anterior Lower Leg . There was a Selective/Open Wound Non-Viable Tissue Debridement with a total area of 4.9 sq cm performed by Kenneth Shan, DO. With the following instrument(s): Curette to remove Viable and Non-Viable tissue/material. Material removed includes Mathews after achieving pain control using Lidocaine. No specimens were taken. A time out was conducted at 13:20, prior to the start of the procedure. A Minimum amount of bleeding was controlled with Pressure. The procedure was tolerated well with a pain level of 0 throughout and a pain level of 0 following the procedure. Post Debridement Measurements: 3.5cm length x 1.4cm width x 0.1cm depth; 0.385cm^3 volume. Character of Wound/Ulcer Post Debridement is improved. Post procedure Diagnosis Wound #5: Same as Pre-Procedure Plan Follow-up Appointments: Return appointment in 3 Hood. - W/ Dr. Heber [place] any day Other: - F/U with Vein and Vascular if wounds get worse. pick up santyl from pharmacy. Anesthetic: (In clinic) Topical Lidocaine 5% applied to wound bed (In clinic) Topical Lidocaine 4% applied to wound bed Bathing/ Shower/ Hygiene: May shower and wash wound with soap and water. Edema Control - Lymphedema / SCD / Other: Elevate legs to the level of the heart or above for 30 minutes daily and/or when sitting for 3-4 times a day throughout the day. Avoid standing for long periods of time. Moisturize legs daily. Off-Loading: Open toe surgical shoe to: - Wear socks with your shoes Other: - -Keep pressure off area as much as is possible wear while resting in bed or chair- Prevalon Boot do not walk or stand with  it. Additional Orders / Instructions: Follow  Nutritious Diet WOUND #4: - Ankle Wound Laterality: Left, Anterior Cleanser: Soap and Water 1 x Per Day/30 Days Discharge Instructions: May shower and wash wound with dial antibacterial soap and water prior to dressing change. Peri-Wound Care: Skin Prep (Generic) 1 x Per Day/30 Days Discharge Instructions: Use skin prep as directed Prim Dressing: Santyl Ointment 1 x Per Day/30 Days ary Discharge Instructions: Apply nickel thick amount to wound bed as instructed Prim Dressing: Hydrofera Blue Ready Foam, 2.5 x2.5 in (Generic) 1 x Per Day/30 Days ary Discharge Instructions: Apply to wound bed as instructed Secondary Dressing: Woven Gauze Sponges 2x2 in 1 x Per Day/30 Days Discharge Instructions: Apply over primary dressing as directed. Secured With: 6M Medipore H Soft Cloth Surgical T ape, 4 x 10 (in/yd) 1 x Per Day/30 Days Discharge Instructions: Secure with tape as directed. Add-Ons: Cotton Tip Swabs (Generic) 1 x Per Day/30 Days WOUND #5: - Lower Leg Wound Laterality: Right, Anterior Cleanser: Soap and Water 1 x Per Day/30 Days Discharge Instructions: May shower and wash wound with dial antibacterial soap and water prior to dressing change. Peri-Wound Care: Skin Prep (Generic) 1 x Per Day/30 Days Discharge Instructions: Use skin prep as directed Prim Dressing: MediHoney Gel, tube 1.5 (oz) 1 x Per Day/30 Days ary Discharge Instructions: Apply to wound bed as instructed Prim Dressing: Hydrofera Blue Ready Foam, 2.5 x2.5 in (Generic) 1 x Per Day/30 Days ary Discharge Instructions: Apply to wound bed as instructed Secondary Dressing: Woven Gauze Sponges 2x2 in 1 x Per Day/30 Days Discharge Instructions: Apply over primary dressing as directed. Secured With: 6M Medipore H Soft Cloth Surgical T ape, 4 x 10 (in/yd) 1 x Per Day/30 Days Discharge Instructions: Secure with tape as directed. Add-Ons: Cotton Tip Swabs (Generic) 1 x Per Day/30  Days 1. In office sharp debridement 2. Hydrofera Blue and Medihoney 3. Hydrofera Blue and Santyl 4. Follow-up in 3 Hood [name], [name] ([id number]) [id number] Page 8 of 9 Electronic Signature(s) Signed: 03/31/2022 3:28:36 PM By: Kenneth Shan DO Entered By: [name] on 03/31/2022 13:30:28 -------------------------------------------------------------------------------- HxROS Details Patient Name: Date of Service: Kenneth Mons Kenneth L. 03/31/2022 12:30 PM Medical Record Number: [id number] Patient Account Number: [id number] Date of Birth/Sex: Treating RN: November 15, 1935 (87 y.o. M) Primary Care Provider: Dorthy Hood, Kenneth Other Clinician: Referring Provider: Treating Provider/Extender: [name], [name] in Treatment: 31 Information Obtained From Patient Chart Eyes Medical History: Positive for: Cataracts - Surgery Cardiovascular Medical History: Positive for: Angina - AFib Past Medical History Notes: Hx CVA Endocrine Medical History: Positive for: Type II Diabetes Treated with: Oral agents, Diet Blood sugar tested every day: No Musculoskeletal Medical History: Positive for: Gout; Osteoarthritis Negative for: Rheumatoid Arthritis; Osteomyelitis Neurologic Medical History: Positive for: Neuropathy HBO Extended History Items Eyes: Cataracts Immunizations Pneumococcal Vaccine: Received Pneumococcal Vaccination: No Implantable Devices None Hospitalization / Surgery History Type of Hospitalization/Surgery inpatient 09/2021 arterial stenting on legs. Family and Social History Cancer: No; Diabetes: Yes - Child; Heart Disease: No; Hereditary Spherocytosis: No; Hypertension: No; Kidney Disease: No; Lung Disease: No; Seizures: No; Stroke: Yes - Mother; Thyroid Problems: No; Tuberculosis: No; Former smoker; Marital Status - Divorced; Alcohol Use: Moderate; Drug Use: Prior History; Caffeine Use: Daily; Financial Concerns: No; Food,  Clothing or Shelter Needs: No; Support System Lacking: No; Transportation Concerns: No Electronic Signature(s) Signed: 03/31/2022 3:28:36 PM By: Kenneth Shan DO Entered By: [name] on 03/31/2022 13:27:26 [name], [name] ([id number]) [phone].pdf Page 9 of 9 -------------------------------------------------------------------------------- SuperBill Details Patient Name: Date of Service:  [name], Kenneth Kenneth L. 03/31/2022 Medical Record Number: [id number] Patient Account Number: [id number] Date of Birth/Sex: Treating RN: August 24, 1935 (87 y.o. M) Primary Care Provider: Dorthy Hood, Kenneth Other Clinician: Referring Provider: Treating Provider/Extender: [name], [name] in Treatment: 31 Diagnosis Coding ICD-10 Codes Code Description E11.621 Type 2 diabetes mellitus with foot ulcer I73.9 Peripheral vascular disease, unspecified M86.171 Other acute osteomyelitis, right ankle and foot E11.40 Type 2 diabetes mellitus with diabetic neuropathy, unspecified L97.514 Non-pressure chronic ulcer of other part of right foot with necrosis of bone L97.918 Non-pressure chronic ulcer of unspecified part of right lower leg with other specified severity S81.802A Unspecified open wound, left lower leg, initial encounter Facility Procedures : CPT4 Code: [id number] Description: [id number] - DEBRIDE WOUND 1ST 20 SQ CM OR < ICD-10 Diagnosis Description L97.918 Non-pressure chronic ulcer of unspecified part of right lower leg with other speci Modifier: fied severity Quantity: 1 Physician Procedures : CPT4 Code Description Modifier [id number] - WC PHYS LEVEL 3 - EST PT 25 ICD-10 Diagnosis Description S81.802A Unspecified open wound, left lower leg, initial encounter I73.9 Peripheral vascular disease, unspecified E11.40 Type 2 diabetes mellitus  with diabetic neuropathy, unspecified Quantity: 1 : [id number] - WC PHYS DEBR WO ANESTH 20 SQ CM ICD-10 Diagnosis Description  [id number] Non-pressure chronic ulcer of unspecified part of right lower leg with other specified severity Quantity: 1 Electronic Signature(s) Signed: 03/31/2022 3:28:36 PM By: Kenneth Shan DO Entered By: [name] on 03/31/2022 13:31:07

## 2022-04-15 ENCOUNTER — Other Ambulatory Visit: Payer: Self-pay | Admitting: Vascular Surgery

## 2022-04-15 ENCOUNTER — Other Ambulatory Visit: Payer: Self-pay | Admitting: Internal Medicine

## 2022-04-15 DIAGNOSIS — M00251 Other streptococcal arthritis, right hip: Secondary | ICD-10-CM

## 2022-04-21 ENCOUNTER — Encounter (HOSPITAL_BASED_OUTPATIENT_CLINIC_OR_DEPARTMENT_OTHER): Payer: Medicare Other | Admitting: Internal Medicine

## 2022-05-18 ENCOUNTER — Telehealth: Payer: Self-pay

## 2022-05-18 DIAGNOSIS — I70223 Atherosclerosis of native arteries of extremities with rest pain, bilateral legs: Secondary | ICD-10-CM

## 2022-05-18 NOTE — Telephone Encounter (Signed)
Pt's daughter, Freeman Caldron, called requesting an appt d/t a non-healing wound on the pt's leg.  Reviewed pt's chart, returned call for clarification, two identifiers used. Informed her that pt was never seen for his 1 mo post-op f/u. Multiple attempts made to schedule. She stated that she was unaware and was eager to make an appt. Since pt is still within the 90 day post-op timeframe, he was scheduled based on staff msg with study requested. Confirmed understanding.

## 2022-06-03 NOTE — Progress Notes (Signed)
Office Note   History of Present Illness   [name] is a 86 y.o. (Feb 24, 1935) male who presents for wound check.  He is status post  angiogram demonstrating Chronically occluded superficial femoral artery with reconstitution at the P2 segment of the popliteal artery. We discussed redo left common femoral artery exposure, femoral to below-knee popliteal artery bypass. After discussing this with the patient, he elected to consider his options, and asked for a 1 month follow-up. 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   Surgical history includes: Bilateral iliofemoral endarterectomy with bilateral common iliac artery stents on 10/09/2021 by Dr. Karin Lieu.  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  He presents today for wound check companied by his daughter.  Over the last 2 months, [name] has been doing well.  He is attempted to do wound care on his own, but has struggled.  In some cases, bandages are left for days.  He notes that by the time bandages are changed, they are saturated, and if saturated his sock.  He has not been to the wound center in quite some time.  He denies fevers, chills.  He has been using Santyl, with a dry dressing.  No compression. Noncompliant with elevation   Current Outpatient Medications  Medication Sig Dispense Refill   amoxicillin (AMOXIL) 500 MG capsule TAKE 1 CAPSULE BY MOUTH TWICE A DAY 60 capsule 2   APPLE CIDER VINEGAR PO Take 1 capsule by mouth daily.     aspirin EC 81 MG tablet Take 81 mg by mouth every other day. Swallow whole. (Patient not taking: Reported on 03/13/2022)     atenolol (TENORMIN) 50 MG tablet Take 50 mg by mouth daily.     cefadroxil (DURICEF) 500 MG capsule Take 1 capsule (500 mg total) by mouth 2 (two) times daily. (Patient not taking: Reported on 03/13/2022) 60 capsule 1   clindamycin (CLEOCIN) 150 MG capsule Take 450 mg by  mouth 3 (three) times daily. (Patient not taking: Reported on 03/13/2022)     clopidogrel (PLAVIX) 75 MG tablet TAKE 1 TABLET BY MOUTH ONCE DAILY AT 6 AM 90 tablet 1   doxycycline (VIBRA-TABS) 100 MG tablet Take 1 tablet (100 mg total) by mouth 2 (two) times daily. (Patient not taking: Reported on 03/13/2022) 60 tablet 1   fluticasone (FLONASE) 50 MCG/ACT nasal spray Place 2 sprays into both nostrils daily.     furosemide (LASIX) 20 MG tablet Take 20 mg by mouth daily.     hydrALAZINE (APRESOLINE) 25 MG tablet Take 1 tablet (25 mg total) by mouth every 8 (eight) hours.     irbesartan (AVAPRO) 75 MG tablet Take 75 mg by mouth daily.     LYRICA 75 MG capsule Take 75-150 mg by mouth See admin instructions. Take 75 mg by mouth in the morning and 150 mg at night     meloxicam (MOBIC) 7.5 MG tablet Take 7.5 mg by mouth daily.     metFORMIN (GLUCOPHAGE) 1000 MG tablet Take 1,000 mg by mouth in the morning and at bedtime.     Multiple Vitamin (MULTIVITAMIN WITH MINERALS) TABS tablet Take 1 tablet by mouth daily.     neomycin-bacitracin-polymyxin (NEOSPORIN) [phone] ointment Apply 1 Application topically daily as needed (wound care).     OVER THE COUNTER MEDICATION Take 1 capsule by mouth daily. Balance of [employer]  OVER THE COUNTER MEDICATION Take 1 tablet by mouth daily. Beet supplement     protein supplement shake (PREMIER PROTEIN) LIQD Take 11 oz by mouth daily.     rosuvastatin (CRESTOR) 20 MG tablet Take 1 tablet (20 mg total) by mouth daily. 90 tablet 3   SANTYL 250 UNIT/GM ointment Apply 1 Application topically daily.     Tetrahydrozoline HCl (VISINE OP) Place 1 drop into both eyes daily as needed (burning).     traMADol (ULTRAM) 50 MG tablet Take 2 tablets (100 mg total) by mouth every 6 (six) hours as needed. Every 4 hours 30 tablet 0   ULORIC 80 MG TABS Take 80 mg by mouth daily.  3   No current facility-administered medications for this visit.    REVIEW OF SYSTEMS (negative unless  checked):   Cardiac:  []  Chest pain or chest pressure? []  Shortness of breath upon activity? []  Shortness of breath when lying flat? []  Irregular heart rhythm?  Vascular:  []  Pain in calf, thigh, or hip brought on by walking? []  Pain in feet at night that wakes you up from your sleep? []  Blood clot in your veins? []  Leg swelling?  Pulmonary:  []  Oxygen at home? []  Productive cough? []  Wheezing?  Neurologic:  []  Sudden weakness in arms or legs? []  Sudden numbness in arms or legs? []  Sudden onset of difficult speaking or slurred speech? []  Temporary loss of vision in one eye? []  Problems with dizziness?  Gastrointestinal:  []  Blood in stool? []  Vomited blood?  Genitourinary:  []  Burning when urinating? []  Blood in urine?  Psychiatric:  []  Major depression  Hematologic:  []  Bleeding problems? []  Problems with blood clotting?  Dermatologic:  []  Rashes or ulcers?  Constitutional:  []  Fever or chills?  Ear/Nose/Throat:  []  Change in hearing? []  Nose bleeds? []  Sore throat?  Musculoskeletal:  []  Back pain? []  Joint pain? []  Muscle pain?   Physical Examination   There were no vitals filed for this visit.  There is no height or weight on file to calculate BMI.  General:  WDWN in NAD; vital signs documented above Gait: Not observed HENT: WNL, normocephalic Pulmonary: normal non-labored breathing  Cardiac: regular rate and rhythm Abdomen: soft, NT, no masses Skin: without rashes Vascular Exam/Pulses: monophasic DP/PT doppler signals on the left. Biphasic DP/PT doppler signals on the right Extremities: Right great toe ulceration with improved appearance and more superficial.  Right ankle wound improved in appearance with scab.  Left anterior distal shin wound with no change in appearance. Edematous lower legs bilaterally Musculoskeletal: no muscle wasting or atrophy  Neurologic: A&O X 3;  No focal weakness or paresthesias are detected Psychiatric:  The  pt has Normal affect.    Non-Invasive Vascular imaging   ABI (03/13/2022) R:  ABI: 0.89 (0.84),  PT: bi DP: bi TBI:  0.56 L:  ABI: 0.52 (0.48),  PT: mono DP: mono TBI: 0.43  Aortoiliac Duplex (03/13/2022) Patent bilateral common iliac artery stents with no stenosis   Medical Decision Making   [name] is a 87 y.o. male who presents for wound check.  The wound on the left shin has worsened.  I am surprised because it was nearly healed.  In further questioning, I think that the reason the wound is worsened is due to poor wound care.  [name] does not change his dressing daily, has not used compression, and sits in the dependent position for the majority of the day.  His daughter  tries to help him when she can, but she also has a child with CP who requires a significant amount of her attention.     We had a long discussion regarding the above.  The only surgery I could offer would be bypass, which [name] is not interested in at this time.  I think he would be best served with aggressive wound care, possible Unna boot in an effort to provide compression to the area.  This is not a wound that is in the usual distribution for arterial insufficiency.  The wound on his toe that was present at his last visit has healed, which makes me think that he has the perfusion necessary to heal a wound on his calf as well.  [name] is aware the only intervention I can offer is bypass surgery.  He would like to attempt aggressive wound care prior to discussing bypass.  My plan is to see him in 6 weeks to assess for improvement.  I have written for referral to the wound care center, as well as home health as he is unable to perform the wound care duties himself.   Continue plavix and statin

## 2022-06-05 ENCOUNTER — Ambulatory Visit (HOSPITAL_COMMUNITY)
Admission: RE | Admit: 2022-06-05 | Discharge: 2022-06-05 | Disposition: A | Discharge status: Home / Self Care | Payer: Medicare Other | Source: Ambulatory Visit | Attending: Vascular Surgery | Admitting: Vascular Surgery

## 2022-06-05 ENCOUNTER — Encounter: Payer: Self-pay | Admitting: Vascular Surgery

## 2022-06-05 ENCOUNTER — Ambulatory Visit (INDEPENDENT_AMBULATORY_CARE_PROVIDER_SITE_OTHER): Payer: Medicare Other | Admitting: Vascular Surgery

## 2022-06-05 VITALS — BP 133/78 | HR 60 | Temp 97.9°F | Resp 20 | Ht 68.5 in | Wt 192.0 lb

## 2022-06-05 DIAGNOSIS — I70242 Atherosclerosis of native arteries of left leg with ulceration of calf: Secondary | ICD-10-CM | POA: Diagnosis not present

## 2022-06-05 DIAGNOSIS — I70223 Atherosclerosis of native arteries of extremities with rest pain, bilateral legs: Secondary | ICD-10-CM | POA: Insufficient documentation

## 2022-06-11 ENCOUNTER — Other Ambulatory Visit: Payer: Self-pay

## 2022-06-11 DIAGNOSIS — I70242 Atherosclerosis of native arteries of left leg with ulceration of calf: Secondary | ICD-10-CM

## 2022-07-02 ENCOUNTER — Encounter (HOSPITAL_BASED_OUTPATIENT_CLINIC_OR_DEPARTMENT_OTHER): Payer: Medicare Other | Attending: Internal Medicine | Admitting: Internal Medicine

## 2022-07-02 DIAGNOSIS — E1151 Type 2 diabetes mellitus with diabetic peripheral angiopathy without gangrene: Secondary | ICD-10-CM | POA: Insufficient documentation

## 2022-07-02 DIAGNOSIS — L97822 Non-pressure chronic ulcer of other part of left lower leg with fat layer exposed: Secondary | ICD-10-CM | POA: Insufficient documentation

## 2022-07-02 DIAGNOSIS — M1990 Unspecified osteoarthritis, unspecified site: Secondary | ICD-10-CM | POA: Diagnosis not present

## 2022-07-02 DIAGNOSIS — I4891 Unspecified atrial fibrillation: Secondary | ICD-10-CM | POA: Insufficient documentation

## 2022-07-02 DIAGNOSIS — E1142 Type 2 diabetes mellitus with diabetic polyneuropathy: Secondary | ICD-10-CM | POA: Insufficient documentation

## 2022-07-02 DIAGNOSIS — E11622 Type 2 diabetes mellitus with other skin ulcer: Secondary | ICD-10-CM | POA: Diagnosis not present

## 2022-07-02 DIAGNOSIS — I70245 Atherosclerosis of native arteries of left leg with ulceration of other part of foot: Secondary | ICD-10-CM | POA: Insufficient documentation

## 2022-07-02 DIAGNOSIS — E11621 Type 2 diabetes mellitus with foot ulcer: Secondary | ICD-10-CM | POA: Insufficient documentation

## 2022-07-02 DIAGNOSIS — I87312 Chronic venous hypertension (idiopathic) with ulcer of left lower extremity: Secondary | ICD-10-CM

## 2022-07-02 DIAGNOSIS — Z833 Family history of diabetes mellitus: Secondary | ICD-10-CM | POA: Diagnosis not present

## 2022-07-02 DIAGNOSIS — L97522 Non-pressure chronic ulcer of other part of left foot with fat layer exposed: Secondary | ICD-10-CM | POA: Insufficient documentation

## 2022-07-03 DIAGNOSIS — E11621 Type 2 diabetes mellitus with foot ulcer: Secondary | ICD-10-CM | POA: Diagnosis not present

## 2022-07-03 DIAGNOSIS — L97822 Non-pressure chronic ulcer of other part of left lower leg with fat layer exposed: Secondary | ICD-10-CM | POA: Diagnosis not present

## 2022-07-03 DIAGNOSIS — L97522 Non-pressure chronic ulcer of other part of left foot with fat layer exposed: Secondary | ICD-10-CM | POA: Diagnosis not present

## 2022-07-03 DIAGNOSIS — S91301A Unspecified open wound, right foot, initial encounter: Secondary | ICD-10-CM | POA: Diagnosis not present

## 2022-07-03 NOTE — Progress Notes (Signed)
[name], [name] ([id number]) [id number] Page 1 of 11 Visit Report for 07/02/2022 Chief Complaint Document Details Patient Name: Date of Service: [name], [name] MES L. 07/02/2022 8:00 A M Medical Record Number: [id number] Patient Account Number: [id number] Date of Birth/Sex: Treating RN: Sep 14, 1935 (87 y.o. M) Primary Care Provider: Docia Hood, Kenneth Other Clinician: Referring Provider: Treating Provider/Extender: [name], [name] in Treatment: 0 Information Obtained from: Patient Chief Complaint 07/02/2022; Left lower extremity wounds Electronic Signature(s) Signed: 07/02/2022 4:14:51 PM By: Kenneth Corwin DO Entered By: [name] on 07/02/2022 09:28:04 -------------------------------------------------------------------------------- Debridement Details Patient Name: Date of Service: [name] MES L. 07/02/2022 8:00 A M Medical Record Number: [id number] Patient Account Number: [id number] Date of Birth/Sex: Treating RN: 1935/08/01 (87 y.o. [name] Primary Care Provider: Docia Hood, Kenneth Other Clinician: Referring Provider: Treating Provider/Extender: [name], [name] in Treatment: 0 Debridement Performed for Assessment: Wound #6 Left,Anterior Lower Leg Performed By: Physician Kenneth Corwin, DO Debridement Type: Chemical/Enzymatic/Mechanical Agent Used: Severity of Tissue Pre Debridement: Fat layer exposed Level of Consciousness (Pre-procedure): Awake and Alert Pre-procedure Verification/Time Out Yes - 09:15 Taken: Start Time: 09:15 Pain Control: Lidocaine 5% topical ointment Percent of Wound Bed Debrided: Instrument: Other : gauze,Vashe Bleeding: None End Time: 09:17 Procedural Pain: 0 Post Procedural Pain: 0 Response to Treatment: Procedure was tolerated well Level of Consciousness (Post- Awake and Alert procedure): Post Debridement Measurements of Total Wound Length: (cm) 8.5 Width: (cm)  6.5 Depth: (cm) 0.1 Volume: (cm) 4.339 Character of Wound/Ulcer Post Debridement: Improved Severity of Tissue Post Debridement: Fat layer exposed Post Procedure Diagnosis Same as Pre-procedure [name], [name] ([id number]) [id number] Page 2 of 11 Notes Scribed for Dr. Mikey Hood, by [name] Electronic Signature(s) Signed: 07/02/2022 4:14:51 PM By: Kenneth Corwin DO Signed: 07/02/2022 6:26:49 PM By: Kenneth Schwalbe RN Entered By: [name] on 07/02/2022 09:22:31 -------------------------------------------------------------------------------- Debridement Details Patient Name: Date of Service: [name] MES L. 07/02/2022 8:00 A M Medical Record Number: [id number] Patient Account Number: [id number] Date of Birth/Sex: Treating RN: 1935-05-29 (87 y.o. [name] Primary Care Provider: Docia Hood, Kenneth Other Clinician: Referring Provider: Treating Provider/Extender: [name], [name] in Treatment: 0 Debridement Performed for Assessment: Wound #7 Left,Lateral Ankle Performed By: Physician Kenneth Corwin, DO Debridement Type: Chemical/Enzymatic/Mechanical Agent Used: Severity of Tissue Pre Debridement: Fat layer exposed Level of Consciousness (Pre-procedure): Awake and Alert Pre-procedure Verification/Time Out Yes - 09:15 Taken: Start Time: 09:15 Pain Control: Lidocaine 5% topical ointment Percent of Wound Bed Debrided: Instrument: Other : gauze,Vashe Bleeding: None End Time: 09:17 Procedural Pain: 0 Post Procedural Pain: 0 Response to Treatment: Procedure was tolerated well Level of Consciousness (Post- Awake and Alert procedure): Post Debridement Measurements of Total Wound Length: (cm) 2 Width: (cm) 1 Depth: (cm) 0.2 Volume: (cm) 0.314 Character of Wound/Ulcer Post Debridement: Improved Severity of Tissue Post Debridement: Fat layer exposed Post Procedure Diagnosis Same as Pre-procedure Notes Scribed for Dr.  Mikey Hood by [name] Electronic Signature(s) Signed: 07/02/2022 4:14:51 PM By: Kenneth Corwin DO Signed: 07/02/2022 6:26:49 PM By: Kenneth Schwalbe RN Entered By: [name] on 07/02/2022 09:23:29 HPI Details -------------------------------------------------------------------------------- [name] ([id number]) [id number] Page 3 of 11 Patient Name: Date of Service: [name], Kenneth MES L. 07/02/2022 8:00 A M Medical Record Number: [id number] Patient Account Number: [id number] Date of Birth/Sex: Treating RN: 10-Apr-1935 (87 y.o. M) Primary Care Provider: Docia Hood, Kenneth Other Clinician: Referring Provider: Treating Provider/Extender: [name], [name] in Treatment: 0 History of Present Illness HPI Description: Admission 08/26/2021 [name] is an  87 year old male with a past medical history of controlled type 2 diabetes on oral agents with peripheral neuropathy and Atrial fibrillation not on blood thinners that presents to the clinic with a right foot wound that has waxed and waned in healing over the past year. He has peripheral neuropathy and is not sure when the wound started or how it started. He had an MRI done by his primary care physician on 08/03/2021 that showed potential early acute osteomyelitis at the posterior calcaneus. He was seen by Dr. Luciana Axe on 08/06/2021 and started on doxycycline and cefadroxil due to these findings. He was evaluated by podiatry, Dr. Ardelle Anton and had a bone biopsy done on 08/13/2021 that did not show evidence of osteomyelitis. He had a culture that showed MRSA sensitive to doxycycline. He also had ABIs with TBI's done on 8/2 that showed an ABI of 0.58 and a noncompressible TBI. He is scheduled to see vein and vascular on 8/30. Currently he is keeping the wound covered. He is using a soft surgical shoe. He denies signs of infection. 8/5; patient presents for follow-up. He had follow-up with Dr. Luciana Axe On 8/23. Plan  is for 6 Hood of doxycycline through September 16. Although path was negative for osteo there is still concern due to positive culture from the bone. Unfortunately patient missed his vein and vascular appointment to assess blood flow status. This has been rescheduled for 9/8. Patient has been using Medihoney to the wound bed. He tries to offload the wound bed but has trouble at night keeping pressure off of it. He does not have Prevalon boots. He currently denies signs of infection. 9/19; patient presents for follow-up. He is scheduled to have a bilateral lower extremity angiogram on 9/20. He currently denies signs of infection. He has been using Medihoney to the wound bed. 11/13; patient has missed his last follow-up. In fact he has not been here in 2 months. He had bilateral iliofemoral endarterectomy with retrograde iliac stenting on 10/09/2021. He has been using Medihoney with Hydrofera Blue to the heel wound. He reports improvement in wound healing here. 2 Hood ago he developed a wound to the dorsal aspect of the great toe. He states His shoes cause this issue and he is currently wearing different shoes. He is on clindamycin as there was concern for infection to the right great toe. He currently denies signs of infection. He has been keeping the great toe open to air. 12/4; patient presents for follow-up. He has been using Medihoney and Hydrofera Blue to the heel wound and right great toe wound. The heel wound is closed. The right great toe wound has bone exposed. He now has a new wound to the right ankle that he states he used to shoehorn to scratch the area and created the wound. He has not been dressing this area. 12/21; patient presents for follow-up. He has been using Hydrofera Blue and Medihoney to the right anterior leg wound and right toe wound. There is been improvement in wound healing. He has been taking doxycycline prescribed at last clinic visit. He has no issues or complaints  today. 1/5; patient presents for follow-up. He has been using Hydrofera Blue and Medihoney to the right anterior leg wound and right toe wound. Unfortunately has developed a new wound to the left lower extremity. He is not sure how this started. He has completed his course of doxycycline. He denies signs of infection. He is scheduled to see vein and vascular for follow-up on 1/12. 1/18;  patient presents for follow-up. He saw vein and vascular on 1/12 for evaluation of a new wound to his left lower extremity. They recommended he continue to elevate his legs. They stated that if the wound became worse or did not improve he will need repeat angiogram. Currently patient denies signs of infection. He has been using Medihoney and Hydrofera Blue to the wound beds. 2/6; patient missed his last clinic appointment. He has been using Medihoney and Hydrofera Blue to the wound beds. He saw vein and vascular on 2/2. No plan for angiography at this time. Patient has no issues or complaints today. 2/20; patient presents for follow-up. He has been using Santyl and Hydrofera Blue to the wound beds however he admits to not dressing the wound beds consistently and leaving them open to air. He denies signs of infection. 3/26; patient presents for follow-up. He had a left lower extremity arteriogram on 3/13. 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 It was recommended that patient would benefit from redo left common femoral artery exposure, femoral to below-knee popliteal artery bypass. At this time patient is going to think about it. He has follow-up with vein and vascular next month. He has been using Santyl and Hydrofera Blue to the wound bed. He has developed a new skin tear to his right knee after a fall. 07/02/2022; patient has been following in our clinic for his lower extremity wounds last seen 03/31/2022. Plan was for him to follow-up in 3 Hood after seeing vein and vascular.  Unfortunately patient canceled his follow-up and never returned. He presents today as he wants to continue wound care. He has been using Santyl to the left lower extremity wound however states this is irritating his skin and he has stopped this and is using Medihoney. Vein and vascular had ordered home health however it is unclear if they are coming out as patient or daughter cannot tell me. He saw vein and vascular on 06/05/2022 and next recommendation was for a femoropopliteal bypass graft on the left. His wound has declined since I last saw him. He currently denies signs of infection. Electronic Signature(s) Signed: 07/02/2022 4:14:51 PM By: Kenneth Corwin DO Entered By: [name] on 07/02/2022 09:31:39 -------------------------------------------------------------------------------- Physical Exam Details Patient Name: Date of Service: [name] MES L. 07/02/2022 8:00 A M Medical Record Number: [id number] Patient Account Number: [id number] Date of Birth/Sex: Treating RN: [birth year] (87 y.o. M) Primary Care Provider: Docia Hood, Kenneth Other Clinician: Referring Provider: Treating Provider/Extender: [name], [name] in TreatmentROLLO, [name] ([id number]) [id number] Page 4 of 11 Constitutional respirations regular, non-labored and within target range for patient.. Cardiovascular 2+ dorsalis pedis/posterior tibialis pulses. Psychiatric pleasant and cooperative. Notes Left lower extremity: Large open wound to the anterior aspect and a small wound to the lateral ankle. No signs of infection. Nonviable tissue throughout. Electronic Signature(s) Signed: 07/02/2022 4:14:51 PM By: Kenneth Corwin DO Entered By: [name] on 07/02/2022 09:32:13 -------------------------------------------------------------------------------- Physician Orders Details Patient Name: Date of Service: [name] MES L. 07/02/2022 8:00 A M Medical Record  Number: [id number] Patient Account Number: [id number] Date of Birth/Sex: Treating RN: Nov 23, 1935 (87 y.o. [name] Primary Care Provider: Docia Hood, Kenneth Other Clinician: Referring Provider: Treating Provider/Extender: [name], [name] in Treatment: 0 Verbal / Phone Orders: No Diagnosis Coding ICD-10 Coding Code Description [phone] Non-pressure chronic ulcer of other part of left lower leg with fat layer exposed L97.522 Non-pressure chronic ulcer of other part of left  foot with fat layer exposed I87.312 Chronic venous hypertension (idiopathic) with ulcer of left lower extremity I70.245 Atherosclerosis of native arteries of left leg with ulceration of other part of foot E11.621 Type 2 diabetes mellitus with foot ulcer E11.622 Type 2 diabetes mellitus with other skin ulcer Follow-up Appointments ppointment in 2 Hood. - Dr. Mikey Hood Room 9 Return A Anesthetic Wound #6 Left,Anterior Lower Leg (In clinic) Topical Lidocaine 5% applied to wound bed Wound #7 Left,Lateral Ankle (In clinic) Topical Lidocaine 5% applied to wound bed Bathing/ Shower/ Hygiene May shower and wash wound with soap and water. - May change dressing after shower/bathj Edema Control - Lymphedema / SCD / Other Bilateral Lower Extremities Elevate legs to the level of the heart or above for 30 minutes daily and/or when sitting for 3-4 times a day throughout the day. - as tolerated Avoid standing for long periods of time. Exercise regularly - as tolerated Moisturize legs daily. Home Health Wound #6 Left,Anterior Lower Leg Admit to Home Health for skilled nursing wound care. May utilize formulary equivalent dressing for wound treatment orders unless otherwise specified. Dressing changes to be completed by Home Health on Monday / Wednesday / Friday except when patient has scheduled visit at [facility] - Please do wound care 3 x week (when pt. comes to [facility], this will count as 1 x wound  care change) Wound #7 Left,Lateral Ankle Admit to Home Health for skilled nursing wound care. May utilize formulary equivalent dressing for wound treatment orders unless [name], [name] ([id number]) [id number] Page 5 of 11 otherwise specified. Dressing changes to be completed by Home Health on Monday / Wednesday / Friday except when patient has scheduled visit at [facility]. - Please do wound care 3 x week (when pt. comes to [facility], this will count as 1 x wound care change) Wound Treatment Wound #6 - Lower Leg Wound Laterality: Left, Anterior Cleanser: Soap and Water 3 x Per Week/30 Days Discharge Instructions: May shower and wash wound with dial antibacterial soap and water prior to dressing change. Cleanser: Vashe 5.8 (oz) 3 x Per Week/30 Days Discharge Instructions: Cleanse the wound with Vashe prior to applying a clean dressing using gauze sponges, not tissue or cotton balls. Cleanser: Wound Cleanser 3 x Per Week/30 Days Discharge Instructions: Cleanse the wound with wound cleanser prior to applying a clean dressing using gauze sponges, not tissue or cotton balls. Prim Dressing: Hydrofera Blue Ready Transfer Foam, 4x5 (in/in) (DME) (Generic) 3 x Per Week/30 Days ary Discharge Instructions: Apply to wound bed as instructed Prim Dressing: MediHoney Gel, tube 1.5 (oz) (Generic) 3 x Per Week/30 Days ary Discharge Instructions: Apply to wound bed as instructed Secondary Dressing: ABD Pad, 5x9 (DME) (Generic) 3 x Per Week/30 Days Discharge Instructions: Apply over primary dressing as directed. Secondary Dressing: Woven Gauze Sponge, Non-Sterile 4x4 in (DME) (Generic) 3 x Per Week/30 Days Discharge Instructions: Apply over primary dressing as directed. Secured With: [employer], 4.5x3.1 (in/yd) (DME) (Generic) 3 x Per Week/30 Days Discharge Instructions: Secure with Kerlix as directed. Secured With: Paper Tape, 2x10 (in/yd) (DME) (Generic) 3 x Per  Week/30 Days Discharge Instructions: Secure dressing with tape as directed. Secured With: Tubigrip Size E, 3.5x10 (in/yds) (DME) (Generic) 3 x Per Week/30 Days Wound #7 - Ankle Wound Laterality: Left, Lateral Cleanser: Soap and Water 3 x Per Week/30 Days Discharge Instructions: May shower and wash wound with dial antibacterial soap and water prior to dressing change. Cleanser: Vashe 5.8 (oz) 3 x  Per Week/30 Days Discharge Instructions: Cleanse the wound with Vashe prior to applying a clean dressing using gauze sponges, not tissue or cotton balls. Cleanser: Wound Cleanser 3 x Per Week/30 Days Discharge Instructions: Cleanse the wound with wound cleanser prior to applying a clean dressing using gauze sponges, not tissue or cotton balls. Prim Dressing: Hydrofera Blue Ready Transfer Foam, 4x5 (in/in) (DME) (Generic) 3 x Per Week/30 Days ary Discharge Instructions: Apply to wound bed as instructed Prim Dressing: MediHoney Gel, tube 1.5 (oz) (Generic) 3 x Per Week/30 Days ary Discharge Instructions: Apply to wound bed as instructed Secondary Dressing: ABD Pad, 5x9 (DME) (Generic) 3 x Per Week/30 Days Discharge Instructions: Apply over primary dressing as directed. Secondary Dressing: Woven Gauze Sponge, Non-Sterile 4x4 in (DME) (Generic) 3 x Per Week/30 Days Discharge Instructions: Apply over primary dressing as directed. Secured With: [employer], 4.5x3.1 (in/yd) (DME) (Generic) 3 x Per Week/30 Days Discharge Instructions: Secure with Kerlix as directed. Secured With: Paper Tape, 2x10 (in/yd) (DME) (Generic) 3 x Per Week/30 Days Discharge Instructions: Secure dressing with tape as directed. Secured With: Tubigrip Size E, 3.5x10 (in/yds) (DME) (Generic) 3 x Per Week/30 Days Electronic Signature(s) Signed: 07/02/2022 4:14:51 PM By: Kenneth Corwin DO Signed: 07/02/2022 6:26:49 PM By: Kenneth Schwalbe RN Entered By: [name] on 07/02/2022 09:41:07 [name], [name] ([id number])  [id number] Page 6 of 11 -------------------------------------------------------------------------------- Problem List Details Patient Name: Date of Service: MUBARAK, PELON MES L. 07/02/2022 8:00 A M Medical Record Number: [id number] Patient Account Number: [id number] Date of Birth/Sex: Treating RN: 01-Jan-1936 (87 y.o. M) Primary Care Provider: Docia Hood, Kenneth Other Clinician: Referring Provider: Treating Provider/Extender: [name], [name] in Treatment: 0 Active Problems ICD-10 Encounter Code Description Active Date MDM Diagnosis L97.822 Non-pressure chronic ulcer of other part of left lower leg with fat layer exposed6/27/2024 No Yes L97.522 Non-pressure chronic ulcer of other part of left foot with fat layer exposed 07/02/2022 No Yes I87.312 Chronic venous hypertension (idiopathic) with ulcer of left lower extremity 07/02/2022 No Yes I70.245 Atherosclerosis of native arteries of left leg with ulceration of other part of 07/02/2022 No Yes foot E11.621 Type 2 diabetes mellitus with foot ulcer 07/02/2022 No Yes E11.622 Type 2 diabetes mellitus with other skin ulcer 07/02/2022 No Yes Inactive Problems Resolved Problems Electronic Signature(s) Signed: 07/02/2022 4:14:51 PM By: Kenneth Corwin DO Entered By: [name] on 07/02/2022 09:24:17 -------------------------------------------------------------------------------- Progress Note Details Patient Name: Date of Service: [name] MES L. 07/02/2022 8:00 A M Medical Record Number: [id number] Patient Account Number: [id number] Date of Birth/Sex: Treating RN: 1935-08-24 (87 y.o. M) Primary Care Provider: Docia Hood, Kenneth Other Clinician: Referring Provider: Treating Provider/Extender: [name], [name] in Treatment: 0 Subjective Chief Complaint [name], [name] ([id number]) [id number] Page 7 of 11 Information obtained from Patient 07/02/2022; Left  lower extremity wounds History of Present Illness (HPI) Admission 08/26/2021 Mr. Gildardo Lardner is an 87 year old male with a past medical history of controlled type 2 diabetes on oral agents with peripheral neuropathy and Atrial fibrillation not on blood thinners that presents to the clinic with a right foot wound that has waxed and waned in healing over the past year. He has peripheral neuropathy and is not sure when the wound started or how it started. He had an MRI done by his primary care physician on 08/03/2021 that showed potential early acute osteomyelitis at the posterior calcaneus. He was seen by Dr. Luciana Axe on 08/06/2021 and started on doxycycline and cefadroxil due to these findings. He was evaluated  by podiatry, Dr. Ardelle Anton and had a bone biopsy done on 08/13/2021 that did not show evidence of osteomyelitis. He had a culture that showed MRSA sensitive to doxycycline. He also had ABIs with TBI's done on 8/2 that showed an ABI of 0.58 and a noncompressible TBI. He is scheduled to see vein and vascular on 8/30. Currently he is keeping the wound covered. He is using a soft surgical shoe. He denies signs of infection. 8/5; patient presents for follow-up. He had follow-up with Dr. Luciana Axe On 8/23. Plan is for 6 Hood of doxycycline through September 16. Although path was negative for osteo there is still concern due to positive culture from the bone. Unfortunately patient missed his vein and vascular appointment to assess blood flow status. This has been rescheduled for 9/8. Patient has been using Medihoney to the wound bed. He tries to offload the wound bed but has trouble at night keeping pressure off of it. He does not have Prevalon boots. He currently denies signs of infection. 9/19; patient presents for follow-up. He is scheduled to have a bilateral lower extremity angiogram on 9/20. He currently denies signs of infection. He has been using Medihoney to the wound bed. 11/13; patient has missed his  last follow-up. In fact he has not been here in 2 months. He had bilateral iliofemoral endarterectomy with retrograde iliac stenting on 10/09/2021. He has been using Medihoney with Hydrofera Blue to the heel wound. He reports improvement in wound healing here. 2 Hood ago he developed a wound to the dorsal aspect of the great toe. He states His shoes cause this issue and he is currently wearing different shoes. He is on clindamycin as there was concern for infection to the right great toe. He currently denies signs of infection. He has been keeping the great toe open to air. 12/4; patient presents for follow-up. He has been using Medihoney and Hydrofera Blue to the heel wound and right great toe wound. The heel wound is closed. The right great toe wound has bone exposed. He now has a new wound to the right ankle that he states he used to shoehorn to scratch the area and created the wound. He has not been dressing this area. 12/21; patient presents for follow-up. He has been using Hydrofera Blue and Medihoney to the right anterior leg wound and right toe wound. There is been improvement in wound healing. He has been taking doxycycline prescribed at last clinic visit. He has no issues or complaints today. 1/5; patient presents for follow-up. He has been using Hydrofera Blue and Medihoney to the right anterior leg wound and right toe wound. Unfortunately has developed a new wound to the left lower extremity. He is not sure how this started. He has completed his course of doxycycline. He denies signs of infection. He is scheduled to see vein and vascular for follow-up on 1/12. 1/18; patient presents for follow-up. He saw vein and vascular on 1/12 for evaluation of a new wound to his left lower extremity. They recommended he continue to elevate his legs. They stated that if the wound became worse or did not improve he will need repeat angiogram. Currently patient denies signs of infection. He has been using  Medihoney and Hydrofera Blue to the wound beds. 2/6; patient missed his last clinic appointment. He has been using Medihoney and Hydrofera Blue to the wound beds. He saw vein and vascular on 2/2. No plan for angiography at this time. Patient has no issues or complaints today.  2/20; patient presents for follow-up. He has been using Santyl and Hydrofera Blue to the wound beds however he admits to not dressing the wound beds consistently and leaving them open to air. He denies signs of infection. 3/26; patient presents for follow-up. He had a left lower extremity arteriogram on 3/13. 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 It was recommended that patient would benefit from redo left common femoral artery exposure, femoral to below-knee popliteal artery bypass. At this time patient is going to think about it. He has follow-up with vein and vascular next month. He has been using Santyl and Hydrofera Blue to the wound bed. He has developed a new skin tear to his right knee after a fall. 07/02/2022; patient has been following in our clinic for his lower extremity wounds last seen 03/31/2022. Plan was for him to follow-up in 3 Hood after seeing vein and vascular. Unfortunately patient canceled his follow-up and never returned. He presents today as he wants to continue wound care. He has been using Santyl to the left lower extremity wound however states this is irritating his skin and he has stopped this and is using Medihoney. Vein and vascular had ordered home health however it is unclear if they are coming out as patient or daughter cannot tell me. He saw vein and vascular on 06/05/2022 and next recommendation was for a femoropopliteal bypass graft on the left. His wound has declined since I last saw him. He currently denies signs of infection. Patient History Information obtained from Patient, Chart. Allergies allopurinol, atorvastatin, ibuprofen Family  History Diabetes - Child, Stroke - Mother, No family history of Cancer, Heart Disease, Hereditary Spherocytosis, Hypertension, Kidney Disease, Lung Disease, Seizures, Thyroid Problems, Tuberculosis. Social History Former smoker, Marital Status - Divorced, Alcohol Use - Moderate, Drug Use - Prior History, Caffeine Use - Daily. Medical History Eyes Patient has history of Cataracts - Surgery Cardiovascular Patient has history of Angina - AFib Endocrine Patient has history of Type II Diabetes Musculoskeletal Patient has history of Gout, Osteoarthritis Denies history of Rheumatoid Arthritis, Osteomyelitis Neurologic Patient has history of Neuropathy Hospitalization/Surgery History - inpatient 09/2021 arterial stenting on legs.. Medical A Surgical History Notes nd Cardiovascular [name], [name] ([id number]) [id number] Page 8 of 11 Hx CVA Objective Constitutional respirations regular, non-labored and within target range for patient.. Vitals Time Taken: 8:32 AM, Height: 68 in, Weight: 190 lbs, BMI: 28.9, Temperature: 98.1 F, Pulse: 80 bpm, Respiratory Rate: 18 breaths/min, Blood Pressure: 155/82 mmHg. Cardiovascular 2+ dorsalis pedis/posterior tibialis pulses. Psychiatric pleasant and cooperative. General Notes: Left lower extremity: Large open wound to the anterior aspect and a small wound to the lateral ankle. No signs of infection. Nonviable tissue throughout. Integumentary (Hair, Skin) Wound #6 status is Open. Original cause of wound was Gradually Appeared. The date acquired was: 05/18/2022. The wound is located on the Left,Anterior Lower Leg. The wound measures 8.5cm length x 6.5cm width x 0.1cm depth; 43.393cm^2 area and 4.339cm^3 volume. There is Fat Layer (Subcutaneous Tissue) exposed. There is no tunneling or undermining noted. There is a medium amount of serosanguineous drainage noted. There is small (1-33%) pink granulation within the wound bed.  There is a large (67-100%) amount of necrotic tissue within the wound bed including Eschar and Adherent Slough. The periwound skin appearance had no abnormalities noted for moisture. The periwound skin appearance had no abnormalities noted for color. The periwound skin appearance exhibited: Scarring. Periwound temperature was noted as No Abnormality. Wound #7 status  is Open. Original cause of wound was Gradually Appeared. The date acquired was: 05/18/2022. The wound is located on the Left,Lateral Ankle. The wound measures 2cm length x 1cm width x 0.2cm depth; 1.571cm^2 area and 0.314cm^3 volume. There is Fat Layer (Subcutaneous Tissue) exposed. There is no tunneling or undermining noted. There is a medium amount of serosanguineous drainage noted. There is small (1-33%) red granulation within the wound bed. There is a large (67-100%) amount of necrotic tissue within the wound bed including Eschar and Adherent Slough. The periwound skin appearance had no abnormalities noted for moisture. The periwound skin appearance had no abnormalities noted for color. The periwound skin appearance exhibited: Scarring. Periwound temperature was noted as No Abnormality. 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 He saw vein and vascular who offered him a left femoropopliteal bypass however  patient does not want to proceed with this. At this time I recommended continuing Medihoney and doing Hydrofera Blue and Tubigrip. He needs to elevate his legs and do appropriate wound care for this to have a chance to heal. I recommended changing the dressing 3 times weekly. We will follow-up and see if we can get him home health. Follow-up in our clinic in 2 Hood. 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 There was a Chemical/Enzymatic/Mechanical debridement performed by Kenneth Corwin, DO. With the following instrument(s): gauze,Vashe after achieving pain control using Lidocaine 5% topical ointment. A time out was conducted at 09:15, prior to the start of the procedure. There was no bleeding. The procedure was tolerated well with a pain level of 0 throughout and a pain level of 0 following the procedure. Post Debridement Measurements: 8.5cm length x 6.5cm width x 0.1cm depth; 4.339cm^3 volume. Character of Wound/Ulcer Post Debridement is improved. Severity of Tissue Post Debridement is: Fat layer exposed. Post procedure Diagnosis Wound #6: Same as Pre-Procedure General Notes: Scribed for Dr. Mikey Hood, by [name]. Wound #7 Pre-procedure diagnosis of Wound #7 is an Arterial Insufficiency Ulcer located on the Left,Lateral Ankle .Severity of Tissue Pre Debridement is: Fat layer exposed. There was a Chemical/Enzymatic/Mechanical debridement performed by Kenneth Corwin, DO. With the following instrument(s): gauze,Vashe after achieving pain control using Lidocaine 5% topical ointment. A time out was conducted at 09:15, prior to the start of the procedure. There was no bleeding. The [name], [name] ([id number]) [id number] Page 9 of 11 procedure was tolerated well with a pain level of 0 throughout and a pain level of 0  following the procedure. Post Debridement Measurements: 2cm length x 1cm width x 0.2cm depth; 0.314cm^3 volume. Character of Wound/Ulcer Post Debridement is improved. Severity of Tissue Post Debridement is: Fat layer exposed. Post procedure Diagnosis Wound #7: Same as Pre-Procedure General Notes: Scribed for Dr. Mikey Hood by [name]. Plan Follow-up Appointments: Return Appointment in 2 Hood. -  Dr. Mikey Hood Room 9 Anesthetic: Wound #6 Left,Anterior Lower Leg: (In clinic) Topical Lidocaine 5% applied to wound bed Wound #7 Left,Lateral Ankle: (In clinic) Topical Lidocaine 5% applied to wound bed Bathing/ Shower/ Hygiene: May shower and wash wound with soap and water. - May change dressing after shower/bathj Edema Control - Lymphedema / SCD / Other: Elevate legs to the level of the heart or above for 30 minutes daily and/or when sitting for 3-4 times a day throughout the day. - as tolerated Avoid standing for long periods of time. Exercise regularly - as tolerated Moisturize legs daily. Home Health: Wound #6 Left,Anterior Lower Leg: Admit to Home Health for skilled nursing wound care. May utilize formulary equivalent dressing for wound treatment orders unless otherwise specified. Dressing changes to be completed by Home Health on Monday / Wednesday / Friday except when patient has scheduled visit at [facility]. - Please do wound care 3 x week (when pt. comes to [facility], this will count as 1 x wound care change) Wound #7 Left,Lateral Ankle: Admit to Home Health for skilled nursing wound care. May utilize formulary equivalent dressing for wound treatment orders unless otherwise specified. Dressing changes to be completed by Home Health on Monday / Wednesday / Friday except when patient has scheduled visit at [facility]. - Please do wound care 3 x week (when pt. comes to [facility], this will count as 1 x wound care change) WOUND #6: - Lower Leg Wound Laterality: Left, Anterior Cleanser:  Soap and Water 3 x Per Week/30 Days Discharge Instructions: May shower and wash wound with dial antibacterial soap and water prior to dressing change. Cleanser: Vashe 5.8 (oz) 3 x Per Week/30 Days Discharge Instructions: Cleanse the wound with Vashe prior to applying a clean dressing using gauze sponges, not tissue or cotton balls. Cleanser: Wound Cleanser 3 x Per Week/30 Days Discharge Instructions: Cleanse the wound with wound cleanser prior to applying a clean dressing using gauze sponges, not tissue or cotton balls. Prim Dressing: Hydrofera Blue Ready Transfer Foam, 4x5 (in/in) (DME) (Generic) 3 x Per Week/30 Days ary Discharge Instructions: Apply to wound bed as instructed Prim Dressing: MediHoney Gel, tube 1.5 (oz) (Generic) 3 x Per Week/30 Days ary Discharge Instructions: Apply to wound bed as instructed Secondary Dressing: ABD Pad, 5x9 (DME) (Generic) 3 x Per Week/30 Days Discharge Instructions: Apply over primary dressing as directed. Secondary Dressing: Woven Gauze Sponge, Non-Sterile 4x4 in (DME) (Generic) 3 x Per Week/30 Days Discharge Instructions: Apply over primary dressing as directed. Secured With: [employer], 4.5x3.1 (in/yd) (DME) (Generic) 3 x Per Week/30 Days Discharge Instructions: Secure with Kerlix as directed. Secured With: Paper T ape, 2x10 (in/yd) (DME) (Generic) 3 x Per Week/30 Days Discharge Instructions: Secure dressing with tape as directed. Secured With: Tubigrip Size E, 3.5x10 (in/yds) (DME) (Generic) 3 x Per Week/30 Days WOUND #7: - Ankle Wound Laterality: Left, Lateral Cleanser: Soap and Water 3 x Per Week/30 Days Discharge Instructions: May shower and wash wound with dial antibacterial soap and water prior to dressing change. Cleanser: Vashe 5.8 (oz) 3 x Per Week/30 Days Discharge Instructions: Cleanse the wound with Vashe prior to applying a clean dressing using gauze sponges, not tissue or cotton balls. Cleanser: Wound Cleanser 3 x Per Week/30  Days Discharge Instructions: Cleanse the wound with wound cleanser prior to applying a clean dressing using gauze sponges, not tissue or cotton balls. Prim Dressing: Hydrofera Blue Ready Transfer Foam, 4x5 (in/in) (DME) (Generic) 3 x Per Week/30 Days  ary Discharge Instructions: Apply to wound bed as instructed Prim Dressing: MediHoney Gel, tube 1.5 (oz) (Generic) 3 x Per Week/30 Days ary Discharge Instructions: Apply to wound bed as instructed Secondary Dressing: ABD Pad, 5x9 (DME) (Generic) 3 x Per Week/30 Days Discharge Instructions: Apply over primary dressing as directed. Secondary Dressing: Woven Gauze Sponge, Non-Sterile 4x4 in (DME) (Generic) 3 x Per Week/30 Days Discharge Instructions: Apply over primary dressing as directed. Secured With: [employer], 4.5x3.1 (in/yd) (DME) (Generic) 3 x Per Week/30 Days Discharge Instructions: Secure with Kerlix as directed. Secured With: Paper T ape, 2x10 (in/yd) (DME) (Generic) 3 x Per Week/30 Days Discharge Instructions: Secure dressing with tape as directed. Secured With: Tubigrip Size E, 3.5x10 (in/yds) (DME) (Generic) 3 x Per Week/30 Days 1. Medihoney and Hydrofera Blue 2. Tubigrip 3. Follow-up in 2 Hood 4. Follow-up/order home health Electronic Signature(s) Signed: 07/03/2022 2:14:44 PM By: Shawn Stall RN, BSN Signed: 07/06/2022 10:26:47 AM By: Kenneth Corwin DO Previous Signature: 07/02/2022 4:14:51 PM Version By: Johnanna Schneiders ([id number]) [id number] Page 10 of 11 Entered By: Shawn Stall on 07/03/2022 14:10:08 -------------------------------------------------------------------------------- HxROS Details Patient Name: Date of Service: KELLY, MARGULIS MES L. 07/02/2022 8:00 A M Medical Record Number: [id number] Patient Account Number: [id number] Date of Birth/Sex: Treating RN: 1935-06-07 (87 y.o. [name] Primary Care Provider: Docia Hood, Kenneth Other Clinician: Referring  Provider: Treating Provider/Extender: [name], [name] in Treatment: 0 Information Obtained From Patient Chart Eyes Medical History: Positive for: Cataracts - Surgery Cardiovascular Medical History: Positive for: Angina - AFib Past Medical History Notes: Hx CVA Endocrine Medical History: Positive for: Type II Diabetes Treated with: Oral agents, Diet Blood sugar tested every day: No Musculoskeletal Medical History: Positive for: Gout; Osteoarthritis Negative for: Rheumatoid Arthritis; Osteomyelitis Neurologic Medical History: Positive for: Neuropathy HBO Extended History Items Eyes: Cataracts Immunizations Pneumococcal Vaccine: Received Pneumococcal Vaccination: No Implantable Devices None Hospitalization / Surgery History Type of Hospitalization/Surgery inpatient 09/2021 arterial stenting on legs. Family and Social History Cancer: No; Diabetes: Yes - Child; Heart Disease: No; Hereditary Spherocytosis: No; Hypertension: No; Kidney Disease: No; Lung Disease: No; Seizures: No; Stroke: Yes - Mother; Thyroid Problems: No; Tuberculosis: No; Former smoker; Marital Status - Divorced; Alcohol Use: Moderate; Drug Use: Prior History; Caffeine Use: Daily; Financial Concerns: No; Food, Clothing or Shelter Needs: No; Support System Lacking: No; Transportation Concerns: No Electronic Signature(s) Signed: 07/02/2022 4:14:51 PM By: Johnanna Schneiders ([id number]) [id number] Page 11 of 11 Signed: 07/02/2022 6:26:49 PM By: Kenneth Schwalbe RN Entered By: [name] on 07/02/2022 08:36:16 -------------------------------------------------------------------------------- SuperBill Details Patient Name: Date of Service: [name] MES L. 07/02/2022 Medical Record Number: [id number] Patient Account Number: [id number] Date of Birth/Sex: Treating RN: 1935-09-01 (87 y.o. M) Primary Care Provider: Docia Hood, Kenneth Other  Clinician: Referring Provider: Treating Provider/Extender: [name], [name] in Treatment: 0 Diagnosis Coding ICD-10 Codes Code Description [phone] Non-pressure chronic ulcer of other part of left lower leg with fat layer exposed L97.522 Non-pressure chronic ulcer of other part of left foot with fat layer exposed I87.312 Chronic venous hypertension (idiopathic) with ulcer of left lower extremity I70.245 Atherosclerosis of native arteries of left leg with ulceration of other part of foot E11.621 Type 2 diabetes mellitus with foot ulcer E11.622 Type 2 diabetes mellitus with other skin ulcer Facility Procedures : CPT4 Code: 41324401 Description: 99214 - WOUND CARE VISIT-LEV 4 EST PT Modifier: Quantity: 1 : CPT4 Code: [id number] Description: 44034 - DEBRIDE W/O ANES NON SELECT Modifier:  Quantity: 1 Physician Procedures : CPT4 Code Description Modifier [id number] 99214 - WC PHYS LEVEL 4 - EST PT ICD-10 Diagnosis Description L97.822 Non-pressure chronic ulcer of other part of left lower leg with fat layer exposed L97.522 Non-pressure chronic ulcer of other part of left foot  with fat layer exposed I87.312 Chronic venous hypertension (idiopathic) with ulcer of left lower extremity I70.245 Atherosclerosis of native arteries of left leg with ulceration of other part of foot Quantity: 1 Electronic Signature(s) Signed: 07/02/2022 6:26:49 PM By: Kenneth Schwalbe RN Signed: 07/03/2022 11:46:38 AM By: Kenneth Corwin DO Previous Signature: 07/02/2022 4:14:51 PM Version By: Kenneth Corwin DO Entered By: [name] on 07/02/2022 18:24:18

## 2022-07-03 NOTE — Progress Notes (Signed)
HAYDON, GRUMBLES ([id number]) [phone] [id number] Page 1 of 4 Visit Report for 07/02/2022 Abuse Risk Screen Details Patient Name: Date of Service: [name], Kenneth MES L. 07/02/2022 8:00 A M Medical Record Number: [id number] Patient Account Number: [id number] Date of Birth/Sex: Treating RN: 12/02/1935 (87 y.o. [name] Primary Care Dyane Broberg: Docia Chuck, Dibas Other Clinician: Referring Vadhir Mcnay: Treating Braniyah Besse/Extender: Eda Keys, Dibas Weeks in Treatment: 0 Abuse Risk Screen Items Answer ABUSE RISK SCREEN: Has anyone close to you tried to hurt or harm you recentlyo No Do you feel uncomfortable with anyone in your familyo No Has anyone forced you do things that you didnt want to doo No Electronic Signature(s) Signed: 07/02/2022 6:26:49 PM By: Karie Schwalbe RN Entered By: Karie Schwalbe on 07/02/2022 08:36:24 -------------------------------------------------------------------------------- Activities of Daily Living Details Patient Name: Date of Service: BRAVLIO, MURAD MES L. 07/02/2022 8:00 A M Medical Record Number: [id number] Patient Account Number: [id number] Date of Birth/Sex: Treating RN: 1935-05-27 (87 y.o. [name] Primary Care Santana Gosdin: Docia Chuck, Dibas Other Clinician: Referring Paidyn Mcferran: Treating Daryan Cagley/Extender: Eda Keys, Dibas Weeks in Treatment: 0 Activities of Daily Living Items Answer Activities of Daily Living (Please select one for each item) Drive Automobile Not Able T Medications ake Completely Able Use T elephone Completely Able Care for Appearance Completely Able Use T oilet Completely Able Bath / Shower Completely Able Dress Self Completely Able Feed Self Completely Able Walk Need Assistance Get In / Out Bed Completely Able Housework Completely Able Prepare Meals Completely Able Handle Money Completely Able Shop for Self Completely Able Electronic Signature(s) Signed: 07/02/2022 6:26:49 PM  By: Karie Schwalbe RN Entered By: Karie Schwalbe on 07/02/2022 08:39:42 Mechele Collin ([id number]) [id number] Page 2 of 4 -------------------------------------------------------------------------------- Education Screening Details Patient Name: Date of Service: CORTRELL, ANGELO MES L. 07/02/2022 8:00 A M Medical Record Number: [id number] Patient Account Number: [id number] Date of Birth/Sex: Treating RN: 05/26/1935 (87 y.o. [name] Primary Care Michal Strzelecki: Docia Chuck, Dibas Other Clinician: Referring Myles Mallicoat: Treating Cordney Barstow/Extender: Eda Keys, Dibas Weeks in Treatment: 0 Primary Learner Assessed: Patient Learning Preferences/Education Level/Primary Language Learning Preference: Explanation, Demonstration, Printed Material Highest Education Level: High School Preferred Language: English Cognitive Barrier Language Barrier: No Translator Needed: No Memory Deficit: No Emotional Barrier: No Cultural/Religious Beliefs Affecting Medical Care: No Physical Barrier Impaired Vision: No Impaired Hearing: No Decreased Hand dexterity: No Knowledge/Comprehension Knowledge Level: High Comprehension Level: High Ability to understand written instructions: High Ability to understand verbal instructions: High Motivation Anxiety Level: Calm Cooperation: Cooperative Education Importance: Acknowledges Need Interest in Health Problems: Asks Questions Perception: Coherent Willingness to Engage in Self-Management High Activities: Readiness to Engage in Self-Management High Activities: Electronic Signature(s) Signed: 07/02/2022 6:26:49 PM By: Karie Schwalbe RN Entered By: Karie Schwalbe on 07/02/2022 08:40:15 -------------------------------------------------------------------------------- Fall Risk Assessment Details Patient Name: Date of Service: [name] 07/02/2022 8:00 A M Medical Record Number: [id number] Patient Account Number:  [id number] Date of Birth/Sex: Treating RN: [birth year] (87 y.o. [name] Primary Care Jeanise Durfey: Docia Chuck, Dibas Other Clinician: Referring Swayzee Wadley: Treating Canuto Kingston/Extender: Eda Keys, Dibas Weeks in Treatment: 0 Fall Risk Assessment Items Have you had 2 or more falls in the last 12 monthso 0 No Diodato, Nysir L ([id number]) [id number] Page 3 of 4 Have you had any fall that resulted in injury in the last 12 monthso 0 No FALLS RISK SCREEN History of falling - immediate or within 3 months 0 No Secondary diagnosis (Do you have 2 or more medical diagnoseso) 0 No Ambulatory aid  None/bed rest/wheelchair/nurse 0 No Crutches/cane/walker 0 No Furniture 0 No Intravenous therapy Access/Saline/Heparin Lock 0 No Gait/Transferring Normal/ bed rest/ wheelchair 0 No Weak (short steps with or without shuffle, stooped but able to lift head while walking, may seek 0 No support from furniture) Impaired (short steps with shuffle, may have difficulty arising from chair, head down, impaired 0 No balance) Mental Status Oriented to own ability 0 No Electronic Signature(s) Signed: 07/02/2022 6:26:49 PM By: Karie Schwalbe RN Entered By: Karie Schwalbe on 07/02/2022 08:40:23 -------------------------------------------------------------------------------- Foot Assessment Details Patient Name: Date of Service: [name] 07/02/2022 8:00 A M Medical Record Number: [id number] Patient Account Number: [id number] Date of Birth/Sex: Treating RN: 07-Jan-1935 (87 y.o. [name] Primary Care Yuniel Blaney: Docia Chuck, Dibas Other Clinician: Referring Jaan Fischel: Treating Sung Parodi/Extender: Eda Keys, Dibas Weeks in Treatment: 0 Foot Assessment Items Site Locations + = Sensation present, - = Sensation absent, C = Callus, U = Ulcer R = Redness, W = Warmth, M = Maceration, PU = Pre-ulcerative lesion F = Fissure, S = Swelling, D =  Dryness Assessment Right: Left: Other Deformity: No No Prior Foot Ulcer: No No Prior Amputation: No No Charcot Joint: No No Ambulatory Status: Ambulatory With Help Assistance Device: KINLEY, YARNELL ([id number]) [id number] Page 4 of 4 Gait: Steady Electronic Signature(s) Signed: 07/02/2022 6:26:49 PM By: Karie Schwalbe RN Entered By: Karie Schwalbe on 07/02/2022 08:41:58 -------------------------------------------------------------------------------- Nutrition Risk Screening Details Patient Name: Date of Service: MAURY, DEININGER MES L. 07/02/2022 8:00 A M Medical Record Number: [id number] Patient Account Number: [id number] Date of Birth/Sex: Treating RN: Mar 28, 1935 (87 y.o. [name] Primary Care Yen Wandell: Docia Chuck, Dibas Other Clinician: Referring Yonis Carreon: Treating Kimani Hovis/Extender: Eda Keys, Dibas Weeks in Treatment: 0 Height (in): 68 Weight (lbs): 190 Body Mass Index (BMI): 28.9 Nutrition Risk Screening Items Score Screening NUTRITION RISK SCREEN: I have an illness or condition that made me change the kind and/or amount of food I eat 0 No I eat fewer than two meals per day 0 No I eat few fruits and vegetables, or milk products 0 No I have three or more drinks of beer, liquor or wine almost every day 0 No I have tooth or mouth problems that make it hard for me to eat 0 No I don't always have enough money to buy the food I need 0 No I eat alone most of the time 0 No I take three or more different prescribed or over-the-counter drugs a day 0 No Without wanting to, I have lost or gained 10 pounds in the last six months 0 No I am not always physically able to shop, cook and/or feed myself 0 No Nutrition Protocols Good Risk Protocol 0 No interventions needed Moderate Risk Protocol High Risk Proctocol Risk Level: Good Risk Score: 0 Electronic Signature(s) Signed: 07/02/2022 6:26:49 PM By: Karie Schwalbe RN Entered  By: Karie Schwalbe on 07/02/2022 08:40:30

## 2022-07-03 NOTE — Progress Notes (Signed)
ISAM, ZANGER ([id number]) [id number] Page 1 of 10 Visit Report for 07/02/2022 Allergy List Details Patient Name: Date of Service: [name], [name] MES L. 07/02/2022 8:00 A M Medical Record Number: [id number] Patient Account Number: [id number] Date of Birth/Sex: Treating RN: 1935/07/18 (87 y.o. [name] Primary Care Kimblery Diop: Docia Chuck, Dibas Other Clinician: Referring Courtnie Brenes: Treating Kacee Koren/Extender: Eda Keys, Dibas Weeks in Treatment: 0 Allergies Active Allergies allopurinol atorvastatin ibuprofen Allergy Notes Electronic Signature(s) Signed: 07/02/2022 6:26:49 PM By: Karie Schwalbe RN Entered By: Karie Schwalbe on 07/02/2022 08:33:38 -------------------------------------------------------------------------------- Arrival Information Details Patient Name: Date of Service: [name] MES L. 07/02/2022 8:00 A M Medical Record Number: [id number] Patient Account Number: [id number] Date of Birth/Sex: Treating RN: [birth year] (87 y.o. [name] Primary Care Shirleen Mcfaul: Docia Chuck, Dibas Other Clinician: Referring Cristiana Yochim: Treating Nivedita Mirabella/Extender: Eda Keys, Dibas Weeks in Treatment: 0 Visit Information Patient Arrived: Walker Arrival Time: 08:31 Accompanied By: daughter Transfer Assistance: Manual Patient Identification Verified: Yes Patient Has Alerts: Yes Patient Alerts: ABI R 0.89 (06/05/22) ABI L 0.52 (06/05/22) History Since Last Visit Added or deleted any medications: No Any new allergies or adverse reactions: No Had a fall or experienced change in activities of daily living that may affect risk of falls: No Signs or symptoms of abuse/neglect since last visito No Hospitalized since last visit: No Implantable device outside of the clinic excluding cellular tissue based products placed in the center since last visit: No Electronic Signature(s) Signed: 07/02/2022 6:26:49 PM By: Karie Schwalbe RN Entered By:  Karie Schwalbe on 07/02/2022 09:02:03 Mechele Collin ([id number]) [id number] Page 2 of 10 -------------------------------------------------------------------------------- Clinic Level of Care Assessment Details Patient Name: Date of Service: [name], [name] MES L. 07/02/2022 8:00 A M Medical Record Number: [id number] Patient Account Number: [id number] Date of Birth/Sex: Treating RN: 11-04-1935 (87 y.o. [name] Primary Care Merlene Dante: Docia Chuck, Dibas Other Clinician: Referring Equilla Que: Treating Brookelyn Gaynor/Extender: Eda Keys, Dibas Weeks in Treatment: 0 Clinic Level of Care Assessment Items TOOL 1 Quantity Score X- 1 0 Use when EandM and Procedure is performed on INITIAL visit ASSESSMENTS - Nursing Assessment / Reassessment X- 1 20 General Physical Exam (combine w/ comprehensive assessment (listed just below) when performed on new pt. evals) X- 1 25 Comprehensive Assessment (HX, ROS, Risk Assessments, Wounds Hx, etc.) ASSESSMENTS - Wound and Skin Assessment / Reassessment X- 1 10 Dermatologic / Skin Assessment (not related to wound area) ASSESSMENTS - Ostomy and/or Continence Assessment and Care []  - 0 Incontinence Assessment and Management []  - 0 Ostomy Care Assessment and Management (repouching, etc.) PROCESS - Coordination of Care []  - 0 Simple Patient / Family Education for ongoing care X- 1 20 Complex (extensive) Patient / Family Education for ongoing care X- 1 10 Staff obtains Chiropractor, Records, T Results / Process Orders est X- 1 10 Staff telephones HHA, Nursing Homes / Clarify orders / etc []  - 0 Routine Transfer to another Facility (non-emergent condition) []  - 0 Routine Hospital Admission (non-emergent condition) X- 1 15 New Admissions / Manufacturing engineer / Ordering NPWT Apligraf, etc. , []  - 0 Emergency Hospital Admission (emergent condition) PROCESS - Special Needs []  - 0 Pediatric / Minor Patient  Management []  - 0 Isolation Patient Management []  - 0 Hearing / Language / Visual special needs X- 1 15 Assessment of Community assistance (transportation, D/C planning, etc.) []  - 0 Additional assistance / Altered mentation []  - 0 Support Surface(s) Assessment (bed, cushion, seat, etc.) INTERVENTIONS - Miscellaneous []  - 0 External ear exam []  -  0 Patient Transfer (multiple staff / Nurse, adult / Similar devices) []  - 0 Simple Staple / Suture removal (25 or less) []  - 0 Complex Staple / Suture removal (26 or more) []  - 0 Hypo/Hyperglycemic Management (do not check if billed separately) []  - 0 Ankle / Brachial Index (ABI) - do not check if billed separately Has the patient been seen at the hospital within the last three years: Yes Total Score: 125 Level Of Care: New/Established - Level 4 Electronic Signature(s) Signed: 07/02/2022 6:31:04 PM By: Karie Schwalbe RN Previous Signature: 07/02/2022 6:26:49 PM Version By: Karie Schwalbe RN Entered By: Karie Schwalbe on 07/02/2022 18:30:35 Mechele Collin ([id number]) [id number] Page 3 of 10 -------------------------------------------------------------------------------- Encounter Discharge Information Details Patient Name: Date of Service: [name], [name] MES L. 07/02/2022 8:00 A M Medical Record Number: [id number] Patient Account Number: [id number] Date of Birth/Sex: Treating RN: 03-30-1935 (87 y.o. [name] Primary Care Kelsie Kramp: Docia Chuck, Dibas Other Clinician: Referring Saraia Platner: Treating Trinda Harlacher/Extender: Eda Keys, Dibas Weeks in Treatment: 0 Encounter Discharge Information Items Post Procedure Vitals Discharge Condition: Stable Temperature (F): 98.1 Ambulatory Status: Wheelchair Pulse (bpm): 80 Discharge Destination: Home Respiratory Rate (breaths/min): 18 Transportation: Private Auto Blood Pressure (mmHg): 155/82 Accompanied By: granddaughter Schedule Follow-up Appointment:  Yes Clinical Summary of Care: Patient Declined Electronic Signature(s) Signed: 07/02/2022 6:26:49 PM By: Karie Schwalbe RN Entered By: Karie Schwalbe on 07/02/2022 18:26:26 -------------------------------------------------------------------------------- Lower Extremity Assessment Details Patient Name: Date of Service: [name] MES L. 07/02/2022 8:00 A M Medical Record Number: [id number] Patient Account Number: [id number] Date of Birth/Sex: Treating RN: 1935/07/19 (87 y.o. [name] Primary Care Siya Flurry: Docia Chuck, Dibas Other Clinician: Referring Tayanna Talford: Treating Delita Chiquito/Extender: Eda Keys, Dibas Weeks in Treatment: 0 Edema Assessment Assessed: [Left: No] [Right: No] [Left: Edema] [Right: :] Calf Left: Right: Point of Measurement: From Medial Instep 37 cm Ankle Left: Right: Point of Measurement: From Medial Instep 22.5 cm Vascular Assessment Pulses: Dorsalis Pedis Palpable: [Left:Yes] Electronic Signature(s) Signed: 07/02/2022 6:26:49 PM By: Karie Schwalbe RN Entered By: Karie Schwalbe on 07/02/2022 08:57:42 Lattanzio, Llana Aliment ([id number]) [id number] Page 4 of 10 -------------------------------------------------------------------------------- Multi Wound Chart Details Patient Name: Date of Service: [name], [name] MES L. 07/02/2022 8:00 A M Medical Record Number: [id number] Patient Account Number: [id number] Date of Birth/Sex: Treating RN: 05/17/1935 (87 y.o. M) Primary Care Maidie Streight: Docia Chuck, Dibas Other Clinician: Referring Jefferey Lippmann: Treating Paidyn Mcferran/Extender: Eda Keys, Dibas Weeks in Treatment: 0 Vital Signs Height(in): 68 Pulse(bpm): Weight(lbs): 190 Blood Pressure(mmHg): Body Mass Index(BMI): 28.9 Temperature(F): Respiratory Rate(breaths/min): 18 [6:Photos:] [N/A:N/A] Left, Anterior Lower Leg Left, Lateral Ankle N/A Wound Location: Gradually Appeared Gradually Appeared N/A Wounding Event: Arterial  Insufficiency Ulcer Arterial Insufficiency Ulcer N/A Primary Etiology: Cataracts, Angina, Type II Diabetes, Cataracts, Angina, Type II Diabetes, N/A Comorbid History: Gout, Osteoarthritis, Neuropathy Gout, Osteoarthritis, Neuropathy 05/18/2022 05/18/2022 N/A Date Acquired: 0 0 N/A Weeks of Treatment: Open Open N/A Wound Status: No No N/A Wound Recurrence: 8.5x6.5x0.1 2x1x0.2 N/A Measurements L x W x D (cm) 43.393 1.571 N/A A (cm) : rea 4.339 0.314 N/A Volume (cm) : Full Thickness Without Exposed Full Thickness Without Exposed N/A Classification: Support Structures Support Structures Medium Medium N/A Exudate A mount: Serosanguineous Serosanguineous N/A Exudate Type: red, brown red, brown N/A Exudate Color: Small (1-33%) Small (1-33%) N/A Granulation A mount: Pink Red N/A Granulation Quality: Large (67-100%) Large (67-100%) N/A Necrotic A mount: Eschar, Adherent Slough Eschar, Adherent Slough N/A Necrotic Tissue: Fat Layer (Subcutaneous Tissue): Yes Fat Layer (Subcutaneous Tissue): Yes N/A Exposed Structures: Small (  1-33%) Small (1-33%) N/A Epithelialization: Chemical/Enzymatic/Mechanical Chemical/Enzymatic/Mechanical N/A Debridement: Pre-procedure Verification/Time Out 09:15 09:15 N/A Taken: Lidocaine 5% topical ointment Lidocaine 5% topical ointment N/A Pain Control: Other(gauze,Vashe) Other(gauze,Vashe) N/A Instrument: None None N/A Bleeding: 0 0 N/A Procedural Pain: 0 0 N/A Post Procedural Pain: Procedure was tolerated well Procedure was tolerated well N/A Debridement Treatment Response: 8.5x6.5x0.1 2x1x0.2 N/A Post Debridement Measurements L x W x D (cm) 4.339 0.314 N/A Post Debridement Volume: (cm) Scarring: Yes Scarring: Yes N/A Periwound Skin Texture: No Abnormalities Noted No Abnormalities Noted N/A Periwound Skin Moisture: No Abnormalities Noted No Abnormalities Noted N/A Periwound Skin Color: No Abnormality No Abnormality  N/A Temperature: Debridement Debridement N/A Procedures Performed: Treatment Notes Electronic Signature(sSWAYAM, CORPORON ([id number]) [id number] Page 5 of 10 Signed: 07/02/2022 4:14:51 PM By: Geralyn Corwin DO Entered By: Geralyn Corwin on 07/02/2022 09:24:41 -------------------------------------------------------------------------------- Multi-Disciplinary Care Plan Details Patient Name: Date of Service: [name] MES L. 07/02/2022 8:00 A M Medical Record Number: [id number] Patient Account Number: [id number] Date of Birth/Sex: Treating RN: 12-Dec-1935 (87 y.o. [name] Primary Care Beautiful Pensyl: Docia Chuck, Dibas Other Clinician: Referring Sueellen Kayes: Treating Ashonti Leandro/Extender: Eda Keys, Dibas Weeks in Treatment: 0 Active Inactive Wound/Skin Impairment Nursing Diagnoses: Impaired tissue integrity Goals: Patient/caregiver will verbalize understanding of skin care regimen Date Initiated: 07/02/2022 Target Resolution Date: 10/05/2022 Goal Status: Active Interventions: Provide education on smoking Treatment Activities: Skin care regimen initiated : 07/02/2022 Notes: Electronic Signature(s) Signed: 07/02/2022 6:26:49 PM By: Karie Schwalbe RN Entered By: Karie Schwalbe on 07/02/2022 18:22:57 -------------------------------------------------------------------------------- Pain Assessment Details Patient Name: Date of Service: [name] MES L. 07/02/2022 8:00 A M Medical Record Number: [id number] Patient Account Number: [id number] Date of Birth/Sex: Treating RN: 08/04/1935 (87 y.o. [name] Primary Care  Grosser: Docia Chuck, Dibas Other Clinician: Referring Neiko Trivedi: Treating Ilham Roughton/Extender: Eda Keys, Dibas Weeks in Treatment: 0 Active Problems Location of Pain Severity and Description of Pain Patient Has Paino Yes Site Locations Pain LocationKINGDAVID, [name] ([id number])  [id number] Page 6 of 10 Pain Location: Generalized Pain With Dressing Change: Yes Duration of the Pain. Constant / Intermittento Constant Rate the pain. Current Pain Level: 7 Worst Pain Level: 10 Least Pain Level: 5 Tolerable Pain Level: 3 Character of Pain Describe the Pain: Difficult to Pinpoint Pain Management and Medication Current Pain Management: Medication: Yes Cold Application: No Rest: Yes Massage: No Activity: No T.E.N.S.: No Heat Application: No Leg drop or elevation: No Is the Current Pain Management Adequate: Adequate How does your wound impact your activities of daily livingo Sleep: No Bathing: No Appetite: No Relationship With Others: No Bladder Continence: No Emotions: No Bowel Continence: No Work: No Toileting: No Drive: No Dressing: No Hobbies: No Electronic Signature(s) Signed: 07/02/2022 6:26:49 PM By: Karie Schwalbe RN Entered By: Karie Schwalbe on 07/02/2022 09:06:28 -------------------------------------------------------------------------------- Patient/Caregiver Education Details Patient Name: Date of Service: [name] MES L. 6/27/2024andnbsp8:00 A M Medical Record Number: [id number] Patient Account Number: [id number] Date of Birth/Gender: Treating RN: [birth year] (87 y.o. [name] Primary Care Physician: Docia Chuck, Dibas Other Clinician: Referring Physician: Treating Physician/Extender: Eda Keys, Dibas Weeks in Treatment: 0 Education Assessment Education Provided To: Patient Education Topics Provided Wound/Skin Impairment: Methods: Explain/Verbal Responses: Return demonstration correctly Electronic Signature(s) Signed: 07/02/2022 6:26:49 PM By: Karie Schwalbe RN Melvyn Neth, Llana Aliment ([id number]) [phone].pdf Page 7 of 10 Entered By: Karie Schwalbe on 07/02/2022 18:23:06 -------------------------------------------------------------------------------- Wound  Assessment Details Patient Name: Date of Service: [name], [name] MES L. 07/02/2022 8:00 A M Medical Record Number: [id number] Patient Account Number: [id number]  Date of Birth/Sex: Treating RN: July 25, 1935 (87 y.o. 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, Anterior Lower Leg Wound Status: Open Wounding Event: Gradually Appeared Comorbid Cataracts, Angina, Type II Diabetes, Gout, Osteoarthritis, Date Acquired: 05/18/2022 History: Neuropathy Weeks Of Treatment: 0 Clustered Wound: No Photos Wound Measurements Length: (cm) 8.5 Width: (cm) 6.5 Depth: (cm) 0.1 Area: (cm) 43.393 Volume: (cm) 4.339 % Reduction in Area: % Reduction in Volume: Epithelialization: Small (1-33%) Tunneling: No Undermining: No Wound Description Classification: Full Thickness Without Exposed Support Structures Exudate Amount: Medium Exudate Type: Serosanguineous Exudate Color: red, brown Foul Odor After Cleansing: No Slough/Fibrino Yes Wound Bed Granulation Amount: Small (1-33%) Exposed Structure Granulation Quality: Pink Fat Layer (Subcutaneous Tissue) Exposed: Yes Necrotic Amount: Large (67-100%) Necrotic Quality: Eschar, Adherent Slough Periwound Skin Texture Texture Color No Abnormalities Noted: No No Abnormalities Noted: Yes Scarring: Yes Temperature / Pain Temperature: No Abnormality Moisture No Abnormalities Noted: Yes Treatment Notes Wound #6 (Lower Leg) Wound Laterality: Left, Anterior Cleanser Soap and Water Discharge Instruction: May shower and wash wound with dial antibacterial soap and water prior to dressing change. [name], [name] ([id number]) [id number] Page 8 of 10 Vashe 5.8 (oz) Discharge Instruction: Cleanse the wound with Vashe  prior to applying a clean dressing using gauze sponges, not tissue or cotton balls. Wound Cleanser Discharge Instruction: Cleanse the wound with wound cleanser prior to applying a clean dressing using gauze sponges, not tissue or cotton balls. Peri-Wound Care Topical Primary Dressing Hydrofera Blue Ready Transfer Foam, 4x5 (in/in) Discharge Instruction: Apply to wound bed as instructed MediHoney Gel, tube 1.5 (oz) Discharge Instruction: Apply to wound bed as instructed Secondary Dressing ABD Pad, 5x9 Discharge Instruction: Apply over primary dressing as directed. Woven Gauze Sponge, Non-Sterile 4x4 in Discharge Instruction: Apply over primary dressing as directed. Secured With [employer], 4.5x3.1 (in/yd) Discharge Instruction: Secure with Kerlix as directed. Paper Tape, 2x10 (in/yd) Discharge Instruction: Secure dressing with tape as directed. Tubigrip Size E, 3.5x10 (in/yds) Compression Wrap Compression Stockings Add-Ons Electronic Signature(s) Signed: 07/02/2022 6:26:49 PM By: Karie Schwalbe RN Entered By: Karie Schwalbe on 07/02/2022 09:05:09 -------------------------------------------------------------------------------- Wound Assessment Details Patient Name: Date of Service: [name] MES L. 07/02/2022 8:00 A M Medical Record Number: [id number] Patient Account Number: [id number] Date of Birth/Sex: Treating RN: 03/22/1935 (87 y.o. 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, Lateral Ankle Wound Status: Open Wounding Event: Gradually Appeared Comorbid Cataracts, Angina, Type II Diabetes, Gout, Osteoarthritis, Date Acquired: 05/18/2022 History: Neuropathy Weeks Of Treatment: 0 Clustered Wound: No Photos [name], [name] ([id number])  [id number] Page 9 of 10 Wound Measurements Length: (cm) 2 Width: (cm) 1 Depth: (cm) 0.2 Area: (cm) 1.571 Volume: (cm) 0.314 % Reduction in Area: % Reduction in Volume: Epithelialization: Small (1-33%) Tunneling: No Undermining: No Wound Description Classification: Full Thickness Without Exposed Support Structures Exudate Amount: Medium Exudate Type: Serosanguineous Exudate Color: red, brown Foul Odor After Cleansing: No Slough/Fibrino Yes Wound Bed Granulation Amount: Small (1-33%) Exposed Structure Granulation Quality: Red Fat Layer (Subcutaneous Tissue) Exposed: Yes Necrotic Amount: Large (67-100%) Necrotic Quality: Eschar, Adherent Slough Periwound Skin Texture Texture Color No Abnormalities Noted: No No Abnormalities Noted: Yes Scarring: Yes Temperature / Pain Temperature: No Abnormality Moisture  No Abnormalities Noted: Yes Treatment Notes Wound #7 (Ankle) Wound Laterality: Left, Lateral Cleanser Soap and Water Discharge Instruction: May shower and wash wound with dial antibacterial soap and water prior to dressing change. Vashe 5.8 (oz) Discharge Instruction: Cleanse the wound with Vashe prior to applying a clean dressing using gauze sponges, not tissue or cotton balls. Wound Cleanser Discharge Instruction: Cleanse the wound with wound cleanser prior to applying a clean dressing using gauze sponges, not tissue or cotton balls. Peri-Wound Care Topical Primary Dressing Hydrofera Blue Ready Transfer Foam, 4x5 (in/in) Discharge Instruction: Apply to wound bed as instructed MediHoney Gel, tube 1.5 (oz) Discharge Instruction: Apply to wound bed as instructed Secondary Dressing ABD Pad, 5x9 Discharge Instruction: Apply over primary dressing as directed. Woven Gauze Sponge, Non-Sterile 4x4 in Discharge Instruction: Apply over primary dressing as directed. Secured With [employer], 4.5x3.1 (in/yd) Discharge Instruction: Secure  with Kerlix as directed. Paper Tape, 2x10 (in/yd) Discharge Instruction: Secure dressing with tape as directed. [name], [name] ([id number]) [id number] Page 10 of 10 Tubigrip Size E, 3.5x10 (in/yds) Compression Wrap Compression Stockings Add-Ons Electronic Signature(s) Signed: 07/02/2022 6:26:49 PM By: Karie Schwalbe RN Entered By: Karie Schwalbe on 07/02/2022 09:05:35 -------------------------------------------------------------------------------- Vitals Details Patient Name: Date of Service: [name] MES L. 07/02/2022 8:00 A M Medical Record Number: [id number] Patient Account Number: [id number] Date of Birth/Sex: Treating RN: [birth year] (87 y.o. [name] Primary Care Sreeja Spies: Docia Chuck, Dibas Other Clinician: Referring Arsh Feutz: Treating Marsden Zaino/Extender: Eda Keys, Dibas Weeks in Treatment: 0 Vital Signs Time Taken: 08:32 Temperature (F): 98.1 Height (in): 68 Pulse (bpm): 80 Weight (lbs): 190 Respiratory Rate (breaths/min): 18 Body Mass Index (BMI): 28.9 Blood Pressure (mmHg): 155/82 Reference Range: 80 - 120 mg / dl Electronic Signature(s) Signed: 07/02/2022 6:26:49 PM By: Karie Schwalbe RN Entered By: Karie Schwalbe on 07/02/2022 18:25:29

## 2022-07-17 ENCOUNTER — Encounter (HOSPITAL_BASED_OUTPATIENT_CLINIC_OR_DEPARTMENT_OTHER): Payer: Medicare Other | Attending: Internal Medicine | Admitting: Internal Medicine

## 2022-07-17 DIAGNOSIS — E11622 Type 2 diabetes mellitus with other skin ulcer: Secondary | ICD-10-CM

## 2022-07-17 DIAGNOSIS — I70245 Atherosclerosis of native arteries of left leg with ulceration of other part of foot: Secondary | ICD-10-CM | POA: Diagnosis not present

## 2022-07-17 DIAGNOSIS — I87312 Chronic venous hypertension (idiopathic) with ulcer of left lower extremity: Secondary | ICD-10-CM | POA: Diagnosis not present

## 2022-07-17 DIAGNOSIS — L97822 Non-pressure chronic ulcer of other part of left lower leg with fat layer exposed: Secondary | ICD-10-CM

## 2022-07-17 DIAGNOSIS — E11621 Type 2 diabetes mellitus with foot ulcer: Secondary | ICD-10-CM | POA: Diagnosis not present

## 2022-07-17 DIAGNOSIS — Z87891 Personal history of nicotine dependence: Secondary | ICD-10-CM | POA: Insufficient documentation

## 2022-07-17 DIAGNOSIS — Z7984 Long term (current) use of oral hypoglycemic drugs: Secondary | ICD-10-CM | POA: Insufficient documentation

## 2022-07-17 DIAGNOSIS — L97522 Non-pressure chronic ulcer of other part of left foot with fat layer exposed: Secondary | ICD-10-CM | POA: Diagnosis not present

## 2022-07-17 DIAGNOSIS — I4891 Unspecified atrial fibrillation: Secondary | ICD-10-CM | POA: Diagnosis not present

## 2022-07-17 DIAGNOSIS — E1151 Type 2 diabetes mellitus with diabetic peripheral angiopathy without gangrene: Secondary | ICD-10-CM | POA: Diagnosis not present

## 2022-07-17 NOTE — Progress Notes (Addendum)
VON, QUINTANAR ([id number]) [id number] Page 1 of 9 Visit Report for 07/17/2022 Chief Complaint Document Details Patient Name: Date of Service: [name], Kenneth MES L. 07/17/2022 11:15 A M Medical Record Number: [id number] Patient Account Number: [id number] Date of Birth/Sex: Treating RN: [birth year] (87 y.o. M) Primary Care Provider: Docia Chuck, Dibas Other Clinician: Referring Provider: Treating Provider/Extender: Eda Keys, Dibas Weeks in Treatment: 2 Information Obtained from: Patient Chief Complaint 07/02/2022; Left lower extremity wounds Electronic Signature(s) Signed: 07/17/2022 12:42:51 PM By: Geralyn Corwin DO Entered By: Geralyn Corwin on 07/17/2022 12:37:06 -------------------------------------------------------------------------------- HPI Details Patient Name: Date of Service: Kenneth Ina MES L. 07/17/2022 11:15 A M Medical Record Number: [id number] Patient Account Number: [id number] Date of Birth/Sex: Treating RN: 20-May-1935 (87 y.o. M) Primary Care Provider: Docia Chuck, Dibas Other Clinician: Referring Provider: Treating Provider/Extender: Eda Keys, Dibas Weeks in Treatment: 2 History of Present Illness HPI Description: Admission 08/26/2021 [name] is an 87 year old male with a past medical history of controlled type 2 diabetes on oral agents with peripheral neuropathy and Atrial fibrillation not on blood thinners that presents to the clinic with a right foot wound that has waxed and waned in healing over the past year. He has peripheral neuropathy and is not sure when the wound started or how it started. He had an MRI done by his primary care physician on 08/03/2021 that showed potential early acute osteomyelitis at the posterior calcaneus. He was seen by Dr. Luciana Axe on 08/06/2021 and started on doxycycline and cefadroxil due to these findings. He was evaluated by podiatry, Dr. Ardelle Anton and had a bone biopsy done on 08/13/2021  that did not show evidence of osteomyelitis. He had a culture that showed MRSA sensitive to doxycycline. He also had ABIs with TBI's done on 8/2 that showed an ABI of 0.58 and a noncompressible TBI. He is scheduled to see vein and vascular on 8/30. Currently he is keeping the wound covered. He is using a soft surgical shoe. He denies signs of infection. 8/5; patient presents for follow-up. He had follow-up with Dr. Luciana Axe On 8/23. Plan is for 6 weeks of doxycycline through September 16. Although path was negative for osteo there is still concern due to positive culture from the bone. Unfortunately patient missed his vein and vascular appointment to assess blood flow status. This has been rescheduled for 9/8. Patient has been using Medihoney to the wound bed. He tries to offload the wound bed but has trouble at night keeping pressure off of it. He does not have Prevalon boots. He currently denies signs of infection. 9/19; patient presents for follow-up. He is scheduled to have a bilateral lower extremity angiogram on 9/20. He currently denies signs of infection. He has been using Medihoney to the wound bed. 11/13; patient has missed his last follow-up. In fact he has not been here in 2 months. He had bilateral iliofemoral endarterectomy with retrograde iliac stenting on 10/09/2021. He has been using Medihoney with Hydrofera Blue to the heel wound. He reports improvement in wound healing here. 2 weeks ago he developed a wound to the dorsal aspect of the great toe. He states His shoes cause this issue and he is currently wearing different shoes. He is on clindamycin as there was concern for infection to the right great toe. He currently denies signs of infection. He has been keeping the great toe open to air. 12/4; patient presents for follow-up. He has been using Medihoney and Hydrofera Blue to the heel wound and right great toe wound.  The heel wound is closed. The right great toe wound has bone exposed.  He now has a new wound to the right ankle that he states he used to shoehorn to scratch the area and created the wound. He has not been dressing this area. 12/21; patient presents for follow-up. He has been using Hydrofera Blue and Medihoney to the right anterior leg wound and right toe wound. There is been improvement in wound healing. He has been taking doxycycline prescribed at last clinic visit. He has no issues or complaints today. 1/5; patient presents for follow-up. He has been using Hydrofera Blue and Medihoney to the right anterior leg wound and right toe wound. Unfortunately has developed a new wound to the left lower extremity. He is not sure how this started. He has completed his course of doxycycline. He denies signs of infection. [name], [name] ([id number]) [id number] Page 2 of 9 He is scheduled to see vein and vascular for follow-up on 1/12. 1/18; patient presents for follow-up. He saw vein and vascular on 1/12 for evaluation of a new wound to his left lower extremity. They recommended he continue to elevate his legs. They stated that if the wound became worse or did not improve he will need repeat angiogram. Currently patient denies signs of infection. He has been using Medihoney and Hydrofera Blue to the wound beds. 2/6; patient missed his last clinic appointment. He has been using Medihoney and Hydrofera Blue to the wound beds. He saw vein and vascular on 2/2. No plan for angiography at this time. Patient has no issues or complaints today. 2/20; patient presents for follow-up. He has been using Santyl and Hydrofera Blue to the wound beds however he admits to not dressing the wound beds consistently and leaving them open to air. He denies signs of infection. 3/26; patient presents for follow-up. He had a left lower extremity arteriogram on 3/13. 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 It was recommended that patient  would benefit from redo left common femoral artery exposure, femoral to below-knee popliteal artery bypass. At this time patient is going to think about it. He has follow-up with vein and vascular next month. He has been using Santyl and Hydrofera Blue to the wound bed. He has developed a new skin tear to his right knee after a fall. 07/02/2022; patient has been following in our clinic for his lower extremity wounds last seen 03/31/2022. Plan was for him to follow-up in 3 weeks after seeing vein and vascular. Unfortunately patient canceled his follow-up and never returned. He presents today as he wants to continue wound care. He has been using Santyl to the left lower extremity wound however states this is irritating his skin and he has stopped this and is using Medihoney. Vein and vascular had ordered home health however it is unclear if they are coming out as patient or daughter cannot tell me. He saw vein and vascular on 06/05/2022 and next recommendation was for a femoropopliteal bypass graft on the left. His wound has declined since I last saw him. He currently denies signs of infection. 7/12; Patient presents for follow-up. He has been using Medihoney and Hydrofera Blue to the wound bed. He has not been using any compression therapy. He has follow-up with vein and vascular in 1 week to discuss the femoropopliteal bypass graft. He currently denies signs of infection. Electronic Signature(s) Signed: 07/17/2022 12:42:51 PM By: Geralyn Corwin DO Entered By: Geralyn Corwin on 07/17/2022 12:38:29 --------------------------------------------------------------------------------  Physical Exam Details Patient Name: Date of Service: [name], [name] MES L. 07/17/2022 11:15 A M Medical Record Number: [id number] Patient Account Number: [id number] Date of Birth/Sex: Treating RN: 02-21-1935 (87 y.o. M) Primary Care Provider: Docia Chuck, Dibas Other Clinician: Referring Provider: Treating Provider/Extender: Eda Keys, Dibas Weeks in Treatment: 2 Constitutional respirations regular, non-labored and within target range for patient.. Cardiovascular 2+ dorsalis pedis/posterior tibialis pulses. Psychiatric pleasant and cooperative. Notes Left lower extremity: Large open wound to the anterior aspect with nonviable tissue throughout. Open wound with nonviable tissue to the lateral ankle. No surrounding signs of infection. 2+ pitting edema to the knee. Electronic Signature(s) Signed: 07/17/2022 12:42:51 PM By: Geralyn Corwin DO Entered By: Geralyn Corwin on 07/17/2022 12:39:18 -------------------------------------------------------------------------------- Physician Orders Details Patient Name: Date of Service: Kenneth Ina MES L. 07/17/2022 11:15 A M Medical Record Number: [id number] Patient Account Number: [id number] Date of Birth/Sex: Treating RN: 1935/12/08 (87 y.o. [name] Primary Care Provider: [place], Dibas Other Clinician: RAUN, ROUTH ([id number]) [id number] Page 3 of 9 Referring Provider: Treating Provider/Extender: Eda Keys, Dibas Weeks in Treatment: 2 Verbal / Phone Orders: No Diagnosis Coding Follow-up Appointments ppointment in 1 week. - Dr. Mikey Bussing room 9 Thursday 07/23/22 at 1:15pm Return A Anesthetic Wound #6 Left,Anterior Lower Leg (In clinic) Topical Lidocaine 5% applied to wound bed Wound #7 Left,Lateral Ankle (In clinic) Topical Lidocaine 5% applied to wound bed Bathing/ Shower/ Hygiene May shower and wash wound with soap and water. - May change dressing after shower/bathj Edema Control - Lymphedema / SCD / Other Bilateral Lower Extremities Elevate legs to the level of the heart or above for 30 minutes daily and/or when sitting for 3-4 times a day throughout the day. - as tolerated Avoid standing for long periods of time. Exercise regularly - as tolerated Moisturize legs daily. Home Health Wound #6  Left,Anterior Lower Leg Admit to Home Health for skilled nursing wound care. May utilize formulary equivalent dressing for wound treatment orders unless otherwise specified. Dressing changes to be completed by Home Health on Monday / Wednesday / Friday except when patient has scheduled visit at [facility]. - Please do wound care 3 x week (when pt. comes to [facility], this will count as 1 x wound care change) Wound #7 Left,Lateral Ankle Admit to Home Health for skilled nursing wound care. May utilize formulary equivalent dressing for wound treatment orders unless otherwise specified. Dressing changes to be completed by Home Health on Monday / Wednesday / Friday except when patient has scheduled visit at [facility]. - Please do wound care 3 x week (when pt. comes to [facility], this will count as 1 x wound care change) Wound Treatment Wound #6 - Lower Leg Wound Laterality: Left, Anterior Cleanser: Soap and Water 3 x Per Week/30 Days Discharge Instructions: May shower and wash wound with dial antibacterial soap and water prior to dressing change. Cleanser: Vashe 5.8 (oz) 3 x Per Week/30 Days Discharge Instructions: Cleanse the wound with Vashe prior to applying a clean dressing using gauze sponges, not tissue or cotton balls. Cleanser: Wound Cleanser 3 x Per Week/30 Days Discharge Instructions: Cleanse the wound with wound cleanser prior to applying a clean dressing using gauze sponges, not tissue or cotton balls. Prim Dressing: Hydrofera Blue Ready Transfer Foam, 4x5 (in/in) (Generic) 3 x Per Week/30 Days ary Discharge Instructions: Apply to wound bed as instructed Prim Dressing: MediHoney Gel, tube 1.5 (oz) 3 x Per Week/30 Days ary Discharge Instructions: Apply to wound bed as  instructed Secondary Dressing: ABD Pad, 5x9 (Generic) 3 x Per Week/30 Days Discharge Instructions: Apply over primary dressing as directed. Secondary Dressing: Woven Gauze Sponge, Non-Sterile 4x4 in (Generic) 3 x Per  Week/30 Days Discharge Instructions: Apply over primary dressing as directed. Secured With: [employer], 4.5x3.1 (in/yd) (Generic) 3 x Per Week/30 Days Discharge Instructions: Secure with Kerlix as directed. Secured With: Paper Tape, 2x10 (in/yd) (Generic) 3 x Per Week/30 Days Discharge Instructions: Secure dressing with tape as directed. Secured With: Tubigrip Size E, 3.5x10 (in/yds) (Generic) 3 x Per Week/30 Days Wound #7 - Ankle Wound Laterality: Left, Lateral Cleanser: Soap and Water 3 x Per Week/30 Days Discharge Instructions: May shower and wash wound with dial antibacterial soap and water prior to dressing change. Cleanser: Vashe 5.8 (oz) 3 x Per Week/30 Days Discharge Instructions: Cleanse the wound with Vashe prior to applying a clean dressing using gauze sponges, not tissue or cotton balls. Cleanser: Wound Cleanser 3 x Per Week/30 Days [name], [name] ([id number]) [id number] Page 4 of 9 Discharge Instructions: Cleanse the wound with wound cleanser prior to applying a clean dressing using gauze sponges, not tissue or cotton balls. Prim Dressing: MediHoney Gel, tube 1.5 (oz) 3 x Per Week/30 Days ary Discharge Instructions: Apply to wound bed as instructed Secondary Dressing: ABD Pad, 5x9 (Generic) 3 x Per Week/30 Days Discharge Instructions: Apply over primary dressing as directed. Secondary Dressing: Woven Gauze Sponge, Non-Sterile 4x4 in (Generic) 3 x Per Week/30 Days Discharge Instructions: Apply over primary dressing as directed. Secured With: [employer], 4.5x3.1 (in/yd) (Generic) 3 x Per Week/30 Days Discharge Instructions: Secure with Kerlix as directed. Secured With: Paper Tape, 2x10 (in/yd) (Generic) 3 x Per Week/30 Days Discharge Instructions: Secure dressing with tape as directed. Secured With: Tubigrip Size E, 3.5x10 (in/yds) (Generic) 3 x Per Week/30 Days Electronic Signature(s) Signed: 07/17/2022 12:42:51 PM By: Geralyn Corwin DO Entered By: Geralyn Corwin on 07/17/2022 12:39:28 -------------------------------------------------------------------------------- Problem List Details Patient Name: Date of Service: Kenneth Ina MES L. 07/17/2022 11:15 A M Medical Record Number: [id number] Patient Account Number: [id number] Date of Birth/Sex: Treating RN: 12/07/1935 (87 y.o. M) Primary Care Provider: Docia Chuck, Dibas Other Clinician: Referring Provider: Treating Provider/Extender: Eda Keys, Dibas Weeks in Treatment: 2 Active Problems ICD-10 Encounter Code Description Active Date MDM Diagnosis L97.822 Non-pressure chronic ulcer of other part of left lower leg with fat layer exposed6/27/2024 No Yes L97.522 Non-pressure chronic ulcer of other part of left foot with fat layer exposed 07/02/2022 No Yes I87.312 Chronic venous hypertension (idiopathic) with ulcer of left lower extremity 07/02/2022 No Yes I70.245 Atherosclerosis of native arteries of left leg with ulceration of other part of 07/02/2022 No Yes foot E11.621 Type 2 diabetes mellitus with foot ulcer 07/02/2022 No Yes E11.622 Type 2 diabetes mellitus with other skin ulcer 07/02/2022 No Yes Inactive Problems [name], [name] ([id number]) [id number] Page 5 of 9 Resolved Problems Electronic Signature(s) Signed: 07/17/2022 12:42:51 PM By: Geralyn Corwin DO Entered By: Geralyn Corwin on 07/17/2022 12:36:54 -------------------------------------------------------------------------------- Progress Note Details Patient Name: Date of Service: Kenneth Ina MES L. 07/17/2022 11:15 A M Medical Record Number: [id number] Patient Account Number: [id number] Date of Birth/Sex: Treating RN: 1935/10/29 (87 y.o. M) Primary Care Provider: Docia Chuck, Dibas Other Clinician: Referring Provider: Treating Provider/Extender: Eda Keys, Dibas Weeks in Treatment: 2 Subjective Chief Complaint Information obtained from  Patient 07/02/2022; Left lower extremity wounds History of Present Illness (HPI) Admission 08/26/2021 Mr. Onnie Hatchel is an 87 year old male with a past medical history of  controlled type 2 diabetes on oral agents with peripheral neuropathy and Atrial fibrillation not on blood thinners that presents to the clinic with a right foot wound that has waxed and waned in healing over the past year. He has peripheral neuropathy and is not sure when the wound started or how it started. He had an MRI done by his primary care physician on 08/03/2021 that showed potential early acute osteomyelitis at the posterior calcaneus. He was seen by Dr. Luciana Axe on 08/06/2021 and started on doxycycline and cefadroxil due to these findings. He was evaluated by podiatry, Dr. Ardelle Anton and had a bone biopsy done on 08/13/2021 that did not show evidence of osteomyelitis. He had a culture that showed MRSA sensitive to doxycycline. He also had ABIs with TBI's done on 8/2 that showed an ABI of 0.58 and a noncompressible TBI. He is scheduled to see vein and vascular on 8/30. Currently he is keeping the wound covered. He is using a soft surgical shoe. He denies signs of infection. 8/5; patient presents for follow-up. He had follow-up with Dr. Luciana Axe On 8/23. Plan is for 6 weeks of doxycycline through September 16. Although path was negative for osteo there is still concern due to positive culture from the bone. Unfortunately patient missed his vein and vascular appointment to assess blood flow status. This has been rescheduled for 9/8. Patient has been using Medihoney to the wound bed. He tries to offload the wound bed but has trouble at night keeping pressure off of it. He does not have Prevalon boots. He currently denies signs of infection. 9/19; patient presents for follow-up. He is scheduled to have a bilateral lower extremity angiogram on 9/20. He currently denies signs of infection. He has been using Medihoney to the wound bed. 11/13;  patient has missed his last follow-up. In fact he has not been here in 2 months. He had bilateral iliofemoral endarterectomy with retrograde iliac stenting on 10/09/2021. He has been using Medihoney with Hydrofera Blue to the heel wound. He reports improvement in wound healing here. 2 weeks ago he developed a wound to the dorsal aspect of the great toe. He states His shoes cause this issue and he is currently wearing different shoes. He is on clindamycin as there was concern for infection to the right great toe. He currently denies signs of infection. He has been keeping the great toe open to air. 12/4; patient presents for follow-up. He has been using Medihoney and Hydrofera Blue to the heel wound and right great toe wound. The heel wound is closed. The right great toe wound has bone exposed. He now has a new wound to the right ankle that he states he used to shoehorn to scratch the area and created the wound. He has not been dressing this area. 12/21; patient presents for follow-up. He has been using Hydrofera Blue and Medihoney to the right anterior leg wound and right toe wound. There is been improvement in wound healing. He has been taking doxycycline prescribed at last clinic visit. He has no issues or complaints today. 1/5; patient presents for follow-up. He has been using Hydrofera Blue and Medihoney to the right anterior leg wound and right toe wound. Unfortunately has developed a new wound to the left lower extremity. He is not sure how this started. He has completed his course of doxycycline. He denies signs of infection. He is scheduled to see vein and vascular for follow-up on 1/12. 1/18; patient presents for follow-up. He saw vein and vascular  on 1/12 for evaluation of a new wound to his left lower extremity. They recommended he continue to elevate his legs. They stated that if the wound became worse or did not improve he will need repeat angiogram. Currently patient denies signs  of infection. He has been using Medihoney and Hydrofera Blue to the wound beds. 2/6; patient missed his last clinic appointment. He has been using Medihoney and Hydrofera Blue to the wound beds. He saw vein and vascular on 2/2. No plan for angiography at this time. Patient has no issues or complaints today. 2/20; patient presents for follow-up. He has been using Santyl and Hydrofera Blue to the wound beds however he admits to not dressing the wound beds consistently and leaving them open to air. He denies signs of infection. 3/26; patient presents for follow-up. He had a left lower extremity arteriogram on 3/13. 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 It was recommended that patient would benefit from redo left common femoral artery exposure, femoral to below-knee popliteal artery bypass. At this time patient is going to think about it. He has follow-up with vein and vascular next month. He has been using Santyl and Hydrofera Blue to the wound bed. He has developed a new skin tear to his right knee after a fall. 07/02/2022; patient has been following in our clinic for his lower extremity wounds last seen 03/31/2022. Plan was for him to follow-up in 3 weeks after seeing vein and vascular. Unfortunately patient canceled his follow-up and never returned. He presents today as he wants to continue wound care. He has been using Santyl to the left lower extremity wound however states this is irritating his skin and he has stopped this and is using Medihoney. Vein and vascular had ordered home health however it is unclear if they are coming out as patient or daughter cannot tell me. He saw vein and vascular on 06/05/2022 and next recommendation was for a femoropopliteal bypass graft on the left. His wound has declined since I last saw him. He currently denies signs of infection. [name], [name] ([id number]) [id number] Page 6 of 9 7/12; Patient  presents for follow-up. He has been using Medihoney and Hydrofera Blue to the wound bed. He has not been using any compression therapy. He has follow-up with vein and vascular in 1 week to discuss the femoropopliteal bypass graft. He currently denies signs of infection. Patient History Information obtained from Patient, Chart. Family History Diabetes - Child, Stroke - Mother, No family history of Cancer, Heart Disease, Hereditary Spherocytosis, Hypertension, Kidney Disease, Lung Disease, Seizures, Thyroid Problems, Tuberculosis. Social History Former smoker, Marital Status - Divorced, Alcohol Use - Moderate, Drug Use - Prior History, Caffeine Use - Daily. Medical History Eyes Patient has history of Cataracts - Surgery Cardiovascular Patient has history of Angina - AFib Endocrine Patient has history of Type II Diabetes Musculoskeletal Patient has history of Gout, Osteoarthritis Denies history of Rheumatoid Arthritis, Osteomyelitis Neurologic Patient has history of Neuropathy Hospitalization/Surgery History - inpatient 09/2021 arterial stenting on legs.. Medical A Surgical History Notes nd Cardiovascular Hx CVA Objective Constitutional respirations regular, non-labored and within target range for patient.. Vitals Time Taken: 11:46 AM, Height: 68 in, Weight: 190 lbs, BMI: 28.9, Temperature: 97.7 F, Pulse: 76 bpm, Respiratory Rate: 18 breaths/min, Blood Pressure: 90/53 mmHg. Cardiovascular 2+ dorsalis pedis/posterior tibialis pulses. Psychiatric pleasant and cooperative. General Notes: Left lower extremity: Large open wound to the anterior aspect with nonviable tissue throughout. Open wound with nonviable  tissue to the lateral ankle. No surrounding signs of infection. 2+ pitting edema to the knee. Integumentary (Hair, Skin) Wound #6 status is Open. Original cause of wound was Gradually Appeared. The date acquired was: 05/18/2022. The wound has been in treatment 2 weeks. The wound is  located on the Left,Anterior Lower Leg. The wound measures 7cm length x 7.5cm width x 0.1cm depth; 41.233cm^2 area and 4.123cm^3 volume. There is Fat Layer (Subcutaneous Tissue) exposed. There is a medium amount of serosanguineous drainage noted. There is small (1-33%) pink granulation within the wound bed. There is a large (67-100%) amount of necrotic tissue within the wound bed including Eschar and Adherent Slough. The periwound skin appearance had no abnormalities noted for moisture. The periwound skin appearance had no abnormalities noted for color. The periwound skin appearance exhibited: Scarring. Periwound temperature was noted as No Abnormality. Wound #7 status is Open. Original cause of wound was Gradually Appeared. The date acquired was: 05/18/2022. The wound has been in treatment 2 weeks. The wound is located on the Left,Lateral Ankle. The wound measures 2cm length x 1cm width x 0.2cm depth; 1.571cm^2 area and 0.314cm^3 volume. There is Fat Layer (Subcutaneous Tissue) exposed. There is a medium amount of serosanguineous drainage noted. There is small (1-33%) red granulation within the wound bed. There is a large (67-100%) amount of necrotic tissue within the wound bed including Eschar and Adherent Slough. The periwound skin appearance had no abnormalities noted for moisture. The periwound skin appearance had no abnormalities noted for color. The periwound skin appearance exhibited: Scarring. Periwound temperature was noted as No Abnormality. Wound #8 status is Healed - Epithelialized. Original cause of wound was Gradually Appeared. The date acquired was: 07/17/2022. The wound is located on the Right,Anterior Lower Leg. The wound measures 0cm length x 0cm width x 0cm depth; 0cm^2 area and 0cm^3 volume. There is no tunneling or undermining noted. There is a none present amount of drainage noted. There is no granulation within the wound bed. There is no necrotic tissue within the wound bed.  The periwound skin appearance had no abnormalities noted for texture. The periwound skin appearance had no abnormalities noted for moisture. The periwound skin appearance had no abnormalities noted for color. Periwound temperature was noted as No Abnormality. 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 Size is stable. I recommended a PCR culture as he may benefit from [facility] antibiotic ointment. For now I recommended continuing with Medihoney and stopping Hydrofera Blue as this appears to be drying out the wound bed. I did advise that he follow-up with vein and vascular for potential femoropopliteal bypass graft. I recommended Tubigrip for compression daily. Follow-up in 1 week. Plan Follow-up Appointments: Return Appointment in 1 week. - Dr. Mikey Bussing room 9 Thursday 07/23/22 at 1:15pm Anesthetic: Wound #6 Left,Anterior Lower Leg: (In clinic) Topical Lidocaine 5% applied to wound bed Wound #7 Left,Lateral Ankle: (In clinic) Topical Lidocaine 5% applied to wound bed Bathing/ Shower/ Hygiene: May shower and wash wound with soap and water. - May change dressing after shower/bathj Edema Control -  Lymphedema / SCD / Other: Elevate legs to the level of the heart or above for 30 minutes daily and/or when sitting for 3-4 times a day throughout the day. - as tolerated Avoid standing for long periods of time. Exercise regularly - as tolerated Moisturize legs daily. Home  Health: Wound #6 Left,Anterior Lower Leg: Admit to Home Health for skilled nursing wound care. May utilize formulary equivalent dressing for wound treatment orders unless otherwise specified. Dressing changes to be completed by Home Health on Monday / Wednesday / Friday except when patient has scheduled visit at [facility]. - Please do wound care 3 x week (when pt. comes to [facility], this will count as 1 x wound care change) Wound #7 Left,Lateral Ankle: Admit to Home Health for skilled nursing wound care. May utilize formulary equivalent dressing for wound treatment orders unless otherwise specified. Dressing changes to be completed by Home Health on Monday / Wednesday / Friday except when patient has scheduled visit at [facility]. - Please do wound care 3 x week (when pt. comes to [facility], this will count as 1 x wound care change) WOUND #6: - Lower Leg Wound Laterality: Left, Anterior Cleanser: Soap and Water 3 x Per Week/30 Days Discharge Instructions: May shower and wash wound with dial antibacterial soap and water prior to dressing change. Cleanser: Vashe 5.8 (oz) 3 x Per Week/30 Days Discharge Instructions: Cleanse the wound with Vashe prior to applying a clean dressing using gauze sponges, not tissue or cotton balls. Cleanser: Wound Cleanser 3 x Per Week/30 Days Discharge Instructions: Cleanse the wound with wound cleanser prior to applying a clean dressing using gauze sponges, not tissue or cotton balls. Prim Dressing: Hydrofera Blue Ready Transfer Foam, 4x5 (in/in) (Generic) 3 x Per Week/30 Days ary Discharge Instructions: Apply to wound bed as instructed Prim Dressing: MediHoney Gel, tube 1.5 (oz) 3 x Per Week/30 Days ary Discharge Instructions: Apply to wound bed as instructed Secondary Dressing: ABD Pad, 5x9 (Generic) 3 x Per Week/30 Days Discharge Instructions: Apply over primary dressing as directed. Secondary Dressing: Woven Gauze Sponge, Non-Sterile 4x4 in (Generic) 3 x  Per Week/30 Days Discharge Instructions: Apply over primary dressing as directed. Secured With: [employer], 4.5x3.1 (in/yd) (Generic) 3 x Per Week/30 Days Discharge Instructions: Secure with Kerlix as directed. Secured With: Paper T ape, 2x10 (in/yd) (Generic) 3 x Per Week/30 Days Discharge Instructions: Secure dressing with tape as directed. Secured With: Tubigrip Size E, 3.5x10 (in/yds) (Generic) 3 x Per Week/30 Days WOUND #7: - Ankle Wound Laterality: Left, Lateral Cleanser: Soap and Water 3 x Per Week/30 Days Discharge Instructions: May shower and wash wound with dial antibacterial soap and water prior to dressing change. Cleanser: Vashe 5.8 (oz) 3 x Per Week/30 Days Discharge Instructions: Cleanse the wound with Vashe prior to applying a clean dressing using gauze sponges, not tissue or cotton balls. Cleanser: Wound Cleanser 3 x Per Week/30 Days Discharge Instructions: Cleanse the wound with wound cleanser prior to applying a clean dressing using gauze sponges, not tissue or cotton balls. Prim Dressing: MediHoney Gel, tube 1.5 (oz) 3 x Per Week/30 Days ary Discharge Instructions: Apply to wound bed as instructed Secondary Dressing: ABD Pad, 5x9 (Generic) 3 x Per Week/30 Days Discharge Instructions: Apply over primary dressing as directed. Secondary Dressing: Woven Gauze Sponge, Non-Sterile 4x4 in (Generic) 3 x Per Week/30 Days Discharge Instructions: Apply over primary dressing as directed. Secured With: [employer], 4.5x3.1 (in/yd) (Generic) 3 x Per Week/30 Days Discharge  Instructions: Secure with Kerlix as directed. Secured With: Paper T ape, 2x10 (in/yd) (Generic) 3 x Per Week/30 Days Discharge Instructions: Secure dressing with tape as directed. Secured With: Tubigrip Size E, 3.5x10 (in/yds) (Generic) 3 x Per Week/30 Days 1. PCR culture  For [facility] antibiotic ointment 2. Medihoney 3. Tubigrip daily 4. Follow-up in 1 week [name], [name] ([id number])  [id number] Page 8 of 9 Electronic Signature(s) Signed: 07/17/2022 12:42:51 PM By: Geralyn Corwin DO Entered By: Geralyn Corwin on 07/17/2022 12:41:11 -------------------------------------------------------------------------------- HxROS Details Patient Name: Date of Service: Kenneth Ina MES L. 07/17/2022 11:15 A M Medical Record Number: [id number] Patient Account Number: [id number] Date of Birth/Sex: Treating RN: 21-Nov-1935 (87 y.o. M) Primary Care Provider: Docia Chuck, Dibas Other Clinician: Referring Provider: Treating Provider/Extender: Eda Keys, Dibas Weeks in Treatment: 2 Information Obtained From Patient Chart Eyes Medical History: Positive for: Cataracts - Surgery Cardiovascular Medical History: Positive for: Angina - AFib Past Medical History Notes: Hx CVA Endocrine Medical History: Positive for: Type II Diabetes Treated with: Oral agents, Diet Blood sugar tested every day: No Musculoskeletal Medical History: Positive for: Gout; Osteoarthritis Negative for: Rheumatoid Arthritis; Osteomyelitis Neurologic Medical History: Positive for: Neuropathy HBO Extended History Items Eyes: Cataracts Immunizations Pneumococcal Vaccine: Received Pneumococcal Vaccination: No Implantable Devices None Hospitalization / Surgery History Type of Hospitalization/Surgery inpatient 09/2021 arterial stenting on legs. Family and Social History Cancer: No; Diabetes: Yes - Child; Heart Disease: No; Hereditary Spherocytosis: No; Hypertension: No; Kidney Disease: No; Lung Disease: No; Seizures: No; Stroke: Yes - Mother; Thyroid Problems: No; Tuberculosis: No; Former smoker; Marital Status - Divorced; Alcohol Use: Moderate; Drug Use: Prior History; Caffeine Use: Daily; Financial Concerns: No; Food, Clothing or Shelter Needs: No; Support System Lacking: No; Transportation Concerns: No [name], [name] ([id number]) [id number]  Page 9 of 9 Electronic Signature(s) Signed: 07/17/2022 12:42:51 PM By: Geralyn Corwin DO Entered By: Geralyn Corwin on 07/17/2022 12:38:34 -------------------------------------------------------------------------------- SuperBill Details Patient Name: Date of Service: Kenneth Ina MES L. 07/17/2022 Medical Record Number: [id number] Patient Account Number: [id number] Date of Birth/Sex: Treating RN: Aug 08, 1935 (87 y.o. M) Primary Care Provider: Docia Chuck, Dibas Other Clinician: Referring Provider: Treating Provider/Extender: Eda Keys, Dibas Weeks in Treatment: 2 Diagnosis Coding ICD-10 Codes Code Description [phone] Non-pressure chronic ulcer of other part of left lower leg with fat layer exposed L97.522 Non-pressure chronic ulcer of other part of left foot with fat layer exposed I87.312 Chronic venous hypertension (idiopathic) with ulcer of left lower extremity I70.245 Atherosclerosis of native arteries of left leg with ulceration of other part of foot E11.621 Type 2 diabetes mellitus with foot ulcer E11.622 Type 2 diabetes mellitus with other skin ulcer Facility Procedures : CPT4 Code: [id number] Description: 99213 - WOUND CARE VISIT-LEV 3 EST PT Modifier: Quantity: 1 Physician Procedures : CPT4 Code Description Modifier [id number] 99213 - WC PHYS LEVEL 3 - EST PT ICD-10 Diagnosis Description L97.822 Non-pressure chronic ulcer of other part of left lower leg with fat layer exposed I87.312 Chronic venous hypertension (idiopathic) with ulcer  of left lower extremity I70.245 Atherosclerosis of native arteries of left leg with ulceration of other part of foot E11.622 Type 2 diabetes mellitus with other skin ulcer Quantity: 1 Electronic Signature(s) Signed: 07/17/2022 2:18:28 PM By: Karie Schwalbe RN Signed: 07/21/2022 4:51:26 PM By: Geralyn Corwin DO Previous Signature: 07/17/2022 12:42:51 PM Version By: Geralyn Corwin DO Entered By: Karie Schwalbe on 07/17/2022 13:19:38

## 2022-07-23 ENCOUNTER — Encounter (HOSPITAL_BASED_OUTPATIENT_CLINIC_OR_DEPARTMENT_OTHER): Payer: Medicare Other | Admitting: Internal Medicine

## 2022-07-23 NOTE — Progress Notes (Signed)
Office Note   History of Present Illness   GOPAL MALTER is a 87 y.o. ([birth year]) male who presents for wound check.  He is status post  angiogram demonstrating Chronically occluded superficial femoral artery with reconstitution at the P2 segment of the popliteal artery in March. We discussed redo left common femoral artery exposure, femoral to below-knee popliteal artery bypass on multiple occasions however [name] is not interested in more surgery.  Surgical history includes: Bilateral iliofemoral endarterectomy with bilateral common iliac artery stents on 10/09/2021 by Dr. Karin Lieu.  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  He presents today for wound check with known, mixed arteriovenous disease. Since last seen [name] thinks the wound on his left leg has improved, with less depth. He originally was going to the wound care center but stopped, stating he didn't like it. Furthermore, he had a wound care nurse that stopped coming due to the number of dogs at his daughters house.   Current wound care is performed intermittently by his daughter and/or [name]. This usually involves a dry dressing held in place by a sock sleeve. The dressing today was saturated and draining down his leg.    Current Outpatient Medications  Medication Sig Dispense Refill   amoxicillin (AMOXIL) 500 MG capsule TAKE 1 CAPSULE BY MOUTH TWICE A DAY 60 capsule 2   APPLE CIDER VINEGAR PO Take 1 capsule by mouth daily.     aspirin EC 81 MG tablet Take 81 mg by mouth every other day. Swallow whole. (Patient not taking: Reported on 03/13/2022)     atenolol (TENORMIN) 50 MG tablet Take 50 mg by mouth daily.     clopidogrel (PLAVIX) 75 MG tablet TAKE 1 TABLET BY MOUTH ONCE DAILY AT 6 AM 90 tablet 1   fluticasone (FLONASE) 50 MCG/ACT nasal spray Place 2 sprays into both nostrils daily.     furosemide (LASIX) 20 MG tablet Take 20 mg by mouth daily.     hydrALAZINE (APRESOLINE) 25 MG tablet  Take 1 tablet (25 mg total) by mouth every 8 (eight) hours.     irbesartan (AVAPRO) 75 MG tablet Take 75 mg by mouth daily.     LYRICA 75 MG capsule Take 75-150 mg by mouth See admin instructions. Take 75 mg by mouth in the morning and 150 mg at night     meloxicam (MOBIC) 7.5 MG tablet Take 7.5 mg by mouth daily.     metFORMIN (GLUCOPHAGE) 1000 MG tablet Take 1,000 mg by mouth in the morning and at bedtime.     Multiple Vitamin (MULTIVITAMIN WITH MINERALS) TABS tablet Take 1 tablet by mouth daily.     neomycin-bacitracin-polymyxin (NEOSPORIN) [phone] ointment Apply 1 Application topically daily as needed (wound care).     OVER THE COUNTER MEDICATION Take 1 capsule by mouth daily. Balance of Nature Fruits     OVER THE COUNTER MEDICATION Take 1 tablet by mouth daily. Beet supplement     protein supplement shake (PREMIER PROTEIN) LIQD Take 11 oz by mouth daily.     rosuvastatin (CRESTOR) 20 MG tablet Take 1 tablet (20 mg total) by mouth daily. 90 tablet 3   SANTYL 250 UNIT/GM ointment Apply 1 Application topically daily.     Tetrahydrozoline HCl (VISINE OP) Place 1 drop into both eyes daily as needed (burning).     traMADol (ULTRAM) 50 MG tablet Take 2 tablets (100 mg total) by mouth every 6 (six) hours as needed. Every  4 hours 30 tablet 0   ULORIC 80 MG TABS Take 80 mg by mouth daily.  3   No current facility-administered medications for this visit.    REVIEW OF SYSTEMS (negative unless checked):   Cardiac:  []  Chest pain or chest pressure? []  Shortness of breath upon activity? []  Shortness of breath when lying flat? []  Irregular heart rhythm?  Vascular:  []  Pain in calf, thigh, or hip brought on by walking? []  Pain in feet at night that wakes you up from your sleep? []  Blood clot in your veins? []  Leg swelling?  Pulmonary:  []  Oxygen at home? []  Productive cough? []  Wheezing?  Neurologic:  []  Sudden weakness in arms or legs? []  Sudden numbness in arms or legs? []  Sudden  onset of difficult speaking or slurred speech? []  Temporary loss of vision in one eye? []  Problems with dizziness?  Gastrointestinal:  []  Blood in stool? []  Vomited blood?  Genitourinary:  []  Burning when urinating? []  Blood in urine?  Psychiatric:  []  Major depression  Hematologic:  []  Bleeding problems? []  Problems with blood clotting?  Dermatologic:  []  Rashes or ulcers?  Constitutional:  []  Fever or chills?  Ear/Nose/Throat:  []  Change in hearing? []  Nose bleeds? []  Sore throat?  Musculoskeletal:  []  Back pain? []  Joint pain? []  Muscle pain?   Physical Examination   There were no vitals filed for this visit.  There is no height or weight on file to calculate BMI.  General:  WDWN in NAD; vital signs documented above Gait: Not observed HENT: WNL, normocephalic Pulmonary: normal non-labored breathing  Cardiac: regular rate and rhythm Abdomen: soft, NT, no masses Skin: without rashes Vascular Exam/Pulses: monophasic DP/PT doppler signals on the left. Biphasic DP/PT doppler signals on the right Extremities: Right great toe ulceration with improved appearance and more superficial.  Right ankle wound improved in appearance with scab.  Left anterior distal shin wound with no change in appearance. Edematous lower legs bilaterally Musculoskeletal: no muscle wasting or atrophy  Neurologic: A&O X 3;  No focal weakness or paresthesias are detected Psychiatric:  The pt has Normal affect.    Non-Invasive Vascular imaging   ABI (03/13/2022) R:  ABI: 0.89 (0.84),  PT: bi DP: bi TBI:  0.56 L:  ABI: 0.52 (0.48),  PT: mono DP: mono TBI: 0.43  Aortoiliac Duplex (03/13/2022) Patent bilateral common iliac artery stents with no stenosis   Medical Decision Making   TAIWO FISH is a 87 y.o. male who presents for wound check.  The wound has not improved. It is shallow but large. I do not think this is due to arterial insufficiency - it was nearly healed. This is  due to venous insufficieny and poor wound care. He sits in the dependent position for the majority of the day watching tv, drinking natural light. There is no significant compression and wound care is intermittent. Dressings are regularly saturated. The wound is not infected. With his PAD, we should keep his GSV rather than ablate.   Similar to last time I had an honest conversation with him regarding the above. He has a toe pressure that should allow for wound healing. The wound in question is venous and require compression. I wrapped his leg today and reinforced that this needs to occur daily, if-not twice daily due to the amount of drainage.   I do not have confidence this will occur. I have sent a referral to the wound care center for unna boot.  My plan is to see in three months with BL iliac duplex due to known kissing iliac stents.   [name] and I talked about three options:  Contnued wound care  Bypass  AKA when necessary.   He wants to continue wound care.  Continue current medications.  Victorino Sparrow MD

## 2022-07-24 ENCOUNTER — Encounter: Payer: Self-pay | Admitting: Vascular Surgery

## 2022-07-24 ENCOUNTER — Ambulatory Visit (INDEPENDENT_AMBULATORY_CARE_PROVIDER_SITE_OTHER): Payer: Medicare Other | Admitting: Vascular Surgery

## 2022-07-24 VITALS — BP 148/77 | HR 68 | Temp 97.3°F | Resp 16 | Ht 68.0 in | Wt 190.0 lb

## 2022-07-24 DIAGNOSIS — I70242 Atherosclerosis of native arteries of left leg with ulceration of calf: Secondary | ICD-10-CM

## 2022-07-28 DIAGNOSIS — E11621 Type 2 diabetes mellitus with foot ulcer: Secondary | ICD-10-CM | POA: Diagnosis not present

## 2022-07-28 DIAGNOSIS — L97522 Non-pressure chronic ulcer of other part of left foot with fat layer exposed: Secondary | ICD-10-CM | POA: Diagnosis not present

## 2022-07-29 ENCOUNTER — Other Ambulatory Visit: Payer: Self-pay

## 2022-07-29 DIAGNOSIS — I7025 Atherosclerosis of native arteries of other extremities with ulceration: Secondary | ICD-10-CM

## 2022-07-29 DIAGNOSIS — I70223 Atherosclerosis of native arteries of extremities with rest pain, bilateral legs: Secondary | ICD-10-CM

## 2022-07-29 NOTE — Progress Notes (Signed)
Kenneth, Hood ([id number]) [id number] Page 1 of 11 Visit Report for 07/17/2022 Arrival Information Details Patient Name: Date of Service: [name], Kenneth MES L. 07/17/2022 11:15 A M Medical Record Number: [id number] Patient Account Number: [id number] Date of Birth/Sex: Treating RN: [birth year] (87 y.o. M) Primary Care Michai Dieppa: Docia Chuck, Dibas Other Clinician: Referring Bolivar Koranda: Treating Blu Lori/Extender: Eda Keys, Dibas Weeks in Treatment: 2 Visit Information History Since Last Visit Added or deleted any medications: No Patient Arrived: Walker Any new allergies or adverse reactions: No Arrival Time: 11:36 Had a fall or experienced change in No Accompanied By: daughter activities of daily living that may affect Transfer Assistance: None risk of falls: Patient Identification Verified: Yes Signs or symptoms of abuse/neglect since last visito No Secondary Verification Process Completed: Yes Hospitalized since last visit: No Patient Has Alerts: Yes Implantable device outside of the clinic excluding No Patient Alerts: ABI R 0.89 (06/05/22) cellular tissue based products placed in the center ABI L 0.52 (06/05/22) since last visit: Has Dressing in Place as Prescribed: Yes Pain Present Now: Yes Electronic Signature(s) Signed: 07/17/2022 1:10:34 PM By: Thayer Dallas Entered By: Thayer Dallas on 07/17/2022 11:46:07 -------------------------------------------------------------------------------- Clinic Level of Care Assessment Details Patient Name: Date of Service: [name], Kenneth MES L. 07/17/2022 11:15 A M Medical Record Number: [id number] Patient Account Number: [id number] Date of Birth/Sex: Treating RN: 12-15-1935 (87 y.o. [name] Primary Care Lenise Jr: Docia Chuck, Dibas Other Clinician: Referring Gera Inboden: Treating Aariyana Manz/Extender: Eda Keys, Dibas Weeks in Treatment: 2 Clinic Level of Care Assessment Items TOOL 4 Quantity  Score X- 1 0 Use when only an EandM is performed on FOLLOW-UP visit ASSESSMENTS - Nursing Assessment / Reassessment X- 1 10 Reassessment of Co-morbidities (includes updates in patient status) X- 1 5 Reassessment of Adherence to Treatment Plan ASSESSMENTS - Wound and Skin A ssessment / Reassessment []  - 0 Simple Wound Assessment / Reassessment - one wound X- 2 5 Complex Wound Assessment / Reassessment - multiple wounds []  - 0 Dermatologic / Skin Assessment (not related to wound area) ASSESSMENTS - Focused Assessment []  - 0 Circumferential Edema Measurements - multi extremities []  - 0 Nutritional Assessment / Counseling / Intervention EMANUAL, LAMOUNTAIN ([id number]) [id number] Page 2 of 11 []  - 0 Lower Extremity Assessment (monofilament, tuning fork, pulses) []  - 0 Peripheral Arterial Disease Assessment (using hand held doppler) ASSESSMENTS - Ostomy and/or Continence Assessment and Care []  - 0 Incontinence Assessment and Management []  - 0 Ostomy Care Assessment and Management (repouching, etc.) PROCESS - Coordination of Care X - Simple Patient / Family Education for ongoing care 1 15 []  - 0 Complex (extensive) Patient / Family Education for ongoing care X- 1 10 Staff obtains Chiropractor, Records, T Results / Process Orders est X- 1 10 Staff telephones HHA, Nursing Homes / Clarify orders / etc []  - 0 Routine Transfer to another Facility (non-emergent condition) []  - 0 Routine Hospital Admission (non-emergent condition) []  - 0 New Admissions / Manufacturing engineer / Ordering NPWT Apligraf, etc. , []  - 0 Emergency Hospital Admission (emergent condition) X- 1 10 Simple Discharge Coordination []  - 0 Complex (extensive) Discharge Coordination PROCESS - Special Needs []  - 0 Pediatric / Minor Patient Management []  - 0 Isolation Patient Management []  - 0 Hearing / Language / Visual special needs []  - 0 Assessment of Community assistance  (transportation, D/C planning, etc.) []  - 0 Additional assistance / Altered mentation []  - 0 Support Surface(s) Assessment (bed, cushion, seat, etc.) INTERVENTIONS - Wound Cleansing / Measurement []  - 0  Simple Wound Cleansing - one wound X- 2 5 Complex Wound Cleansing - multiple wounds X- 1 5 Wound Imaging (photographs - any number of wounds) []  - 0 Wound Tracing (instead of photographs) []  - 0 Simple Wound Measurement - one wound X- 2 5 Complex Wound Measurement - multiple wounds INTERVENTIONS - Wound Dressings []  - 0 Small Wound Dressing one or multiple wounds []  - 0 Medium Wound Dressing one or multiple wounds []  - 0 Large Wound Dressing one or multiple wounds []  - 0 Application of Medications - topical []  - 0 Application of Medications - injection INTERVENTIONS - Miscellaneous []  - 0 External ear exam []  - 0 Specimen Collection (cultures, biopsies, blood, body fluids, etc.) []  - 0 Specimen(s) / Culture(s) sent or taken to Lab for analysis []  - 0 Patient Transfer (multiple staff / Nurse, adult / Similar devices) []  - 0 Simple Staple / Suture removal (25 or less) []  - 0 Complex Staple / Suture removal (26 or more) []  - 0 Hypo / Hyperglycemic Management (close monitor of Blood Glucose) JOMO, FORAND L ([id number]) [id number] Page 3 of 11 []  - 0 Ankle / Brachial Index (ABI) - do not check if billed separately X- 1 5 Vital Signs Has the patient been seen at the hospital within the last three years: Yes Total Score: 100 Level Of Care: New/Established - Level 3 Electronic Signature(s) Signed: 07/17/2022 2:18:28 PM By: Karie Schwalbe RN Entered By: Karie Schwalbe on 07/17/2022 13:19:29 -------------------------------------------------------------------------------- Encounter Discharge Information Details Patient Name: Date of Service: Kenneth Ina MES L. 07/17/2022 11:15 A M Medical Record Number: [id number] Patient Account Number:  [id number] Date of Birth/Sex: Treating RN: 12-22-1935 (87 y.o. [name] Primary Care [name]: Docia Chuck, Dibas Other Clinician: Referring Meygan Kyser: Treating Bobie Kistler/Extender: Eda Keys, Dibas Weeks in Treatment: 2 Encounter Discharge Information Items Discharge Condition: Stable Ambulatory Status: Cane Discharge Destination: Home Transportation: Private Auto Accompanied By: Daughter Schedule Follow-up Appointment: Yes Clinical Summary of Care: Patient Declined Electronic Signature(s) Signed: 07/17/2022 2:18:28 PM By: Karie Schwalbe RN Entered By: Karie Schwalbe on 07/17/2022 13:20:11 -------------------------------------------------------------------------------- Lower Extremity Assessment Details Patient Name: Date of Service: [name], Kenneth MES L. 07/17/2022 11:15 A M Medical Record Number: [id number] Patient Account Number: [id number] Date of Birth/Sex: Treating RN: 1935-12-31 (87 y.o. [name] Primary Care Tylar Merendino: Docia Chuck, Dibas Other Clinician: Referring Lashondra Vaquerano: Treating Linkon Siverson/Extender: Eda Keys, Dibas Weeks in Treatment: 2 Edema Assessment Assessed: [Left: No] [Right: No] [Left: Edema] [Right: :] Calf Left: Right: Point of Measurement: From Medial Instep 37 cm 37 cm Ankle Left: Right: Point of Measurement: From Medial Instep 22.5 cm 22.5 cm Vascular Assessment YASSINE, BRUNSMAN L ([id number]) [Right:[id number] Page 4 of 11] Pulses: Dorsalis Pedis Palpable: [Left:Yes] [Right:Yes] Electronic Signature(s) Signed: 07/17/2022 2:18:28 PM By: Karie Schwalbe RN Entered By: Karie Schwalbe on 07/17/2022 12:08:21 -------------------------------------------------------------------------------- Multi Wound Chart Details Patient Name: Date of Service: Kenneth Ina MES L. 07/17/2022 11:15 A M Medical Record Number: [id number] Patient Account Number: [id number] Date of Birth/Sex: Treating RN: 1935/03/17 (87 y.o.  M) Primary Care Lalani Winkles: Docia Chuck, Dibas Other Clinician: Referring Hilda Rynders: Treating Madix Blowe/Extender: Eda Keys, Dibas Weeks in Treatment: 2 Vital Signs Height(in): 68 Pulse(bpm): 76 Weight(lbs): 190 Blood Pressure(mmHg): 90/53 Body Mass Index(BMI): 28.9 Temperature(F): 97.7 Respiratory Rate(breaths/min): 18 [6:Photos:] Left, Anterior Lower Leg Left, Lateral Ankle Right, Anterior Lower Leg Wound Location: Gradually Appeared Gradually Appeared Gradually Appeared Wounding Event: Arterial Insufficiency Ulcer Arterial Insufficiency Ulcer Abrasion Primary Etiology: Cataracts, Angina, Type II Diabetes, Cataracts, Angina, Type II Diabetes, Cataracts, Angina,  Type II Diabetes, Comorbid History: Gout, Osteoarthritis, Neuropathy Gout, Osteoarthritis, Neuropathy Gout, Osteoarthritis, Neuropathy 05/18/2022 05/18/2022 07/17/2022 Date Acquired: 2 2 0 Weeks of Treatment: Open Open Healed - Epithelialized Wound Status: No No No Wound Recurrence: No No Yes Clustered Wound: 7x7.5x0.1 2x1x0.2 0x0x0 Measurements L x W x D (cm) 41.233 1.571 0 A (cm) : rea 4.123 0.314 0 Volume (cm) : 5.00% 0.00% N/A % Reduction in Area: 5.00% 0.00% N/A % Reduction in Volume: Full Thickness Without Exposed Full Thickness Without Exposed Full Thickness Without Exposed Classification: Support Structures Support Structures Support Structures Medium Medium None Present Exudate A mount: Serosanguineous Serosanguineous N/A Exudate Type: red, brown red, brown N/A Exudate Color: Small (1-33%) Small (1-33%) None Present (0%) Granulation Amount: Pink Red N/A Granulation Quality: Large (67-100%) Large (67-100%) None Present (0%) Necrotic Amount: Eschar, Adherent Slough Eschar, Adherent Slough N/A Necrotic Tissue: Fat Layer (Subcutaneous Tissue): Yes Fat Layer (Subcutaneous Tissue): Yes Fascia: No Exposed Structures: Fat Layer (Subcutaneous Tissue): No Tendon: No Muscle: No Joint:  No Bone: No Small (1-33%) Small (1-33%) Large (67-100%) Epithelialization: Scarring: Yes Scarring: Yes No Abnormalities Noted Periwound Skin Texture: No Abnormalities Noted No Abnormalities Noted No Abnormalities Noted Periwound Skin Moisture: No Abnormalities Noted No Abnormalities Noted No Abnormalities Noted Periwound Skin Color: No Abnormality No Abnormality No Abnormality Temperature: ASAHEL, RISDEN ([id number]) [id number] Page 5 of 11 Treatment Notes Electronic Signature(s) Signed: 07/17/2022 12:42:51 PM By: Geralyn Corwin DO Entered By: Geralyn Corwin on 07/17/2022 12:37:00 -------------------------------------------------------------------------------- Multi-Disciplinary Care Plan Details Patient Name: Date of Service: Kenneth Ina MES L. 07/17/2022 11:15 A M Medical Record Number: [id number] Patient Account Number: [id number] Date of Birth/Sex: Treating RN: [birth year] (87 y.o. [name] Primary Care Alfhild Partch: Docia Chuck, Dibas Other Clinician: Referring Ulysses Alper: Treating Freida Nebel/Extender: Eda Keys, Dibas Weeks in Treatment: 2 Active Inactive Wound/Skin Impairment Nursing Diagnoses: Impaired tissue integrity Goals: Patient/caregiver will verbalize understanding of skin care regimen Date Initiated: 07/02/2022 Target Resolution Date: 11/04/2022 Goal Status: Active Interventions: Provide education on smoking Treatment Activities: Skin care regimen initiated : 07/02/2022 Notes: Electronic Signature(s) Signed: 07/17/2022 2:18:28 PM By: Karie Schwalbe RN Entered By: Karie Schwalbe on 07/17/2022 13:18:09 -------------------------------------------------------------------------------- Pain Assessment Details Patient Name: Date of Service: Kenneth Ina MES L. 07/17/2022 11:15 A M Medical Record Number: [id number] Patient Account Number: [id number] Date of Birth/Sex: Treating RN: 1935/06/25 (87 y.o. M) Primary Care Kaymarie Wynn:  Docia Chuck, Dibas Other Clinician: Referring Amy Gothard: Treating Evey Mcmahan/Extender: Eda Keys, Dibas Weeks in Treatment: 2 Active Problems Location of Pain Severity and Description of Pain Patient Has Paino Yes Site Locations Pain LocationBRAYTON, BAUMGARTNER ([id number]) [id number] Page 6 of 11 Pain Location: Generalized Pain, Pain in Ulcers Rate the pain. Current Pain Level: 5 Pain Management and Medication Current Pain Management: Electronic Signature(s) Signed: 07/17/2022 1:10:34 PM By: Thayer Dallas Entered By: Thayer Dallas on 07/17/2022 11:47:24 -------------------------------------------------------------------------------- Patient/Caregiver Education Details Patient Name: Date of Service: Kenneth Ina MES L. 7/12/2024andnbsp11:15 A M Medical Record Number: [id number] Patient Account Number: [id number] Date of Birth/Gender: Treating RN: 02-Jul-1935 (87 y.o. [name] Primary Care Physician: Docia Chuck, Dibas Other Clinician: Referring Physician: Treating Physician/Extender: Eda Keys, Dibas Weeks in Treatment: 2 Education Assessment Education Provided To: Patient Education Topics Provided Wound/Skin Impairment: Methods: Explain/Verbal Responses: Return demonstration correctly Electronic Signature(s) Signed: 07/17/2022 2:18:28 PM By: Karie Schwalbe RN Entered By: Karie Schwalbe on 07/17/2022 13:18:22 -------------------------------------------------------------------------------- Wound Assessment Details Patient Name: Date of Service: Kenneth Ina MES L. 07/17/2022 11:15 A M Medical Record Number: [id number] Patient Account Number: [id number] Date  of Birth/Sex: Treating RN: [birth year] (87 y.o. 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, Anterior Lower Leg Wound Status: Open Wounding Event: Gradually Appeared Comorbid Cataracts, Angina, Type II Diabetes, Gout, Osteoarthritis, Date Acquired: 05/18/2022 History: Neuropathy Weeks Of Treatment: 2 Clustered Wound: No Photos Wound Measurements Length: (cm) 7 Width: (cm) 7.5 Depth: (cm) 0.1 Area: (cm) 41.233 Volume: (cm) 4.123 % Reduction in Area: 5% % Reduction in Volume: 5% Epithelialization: Small (1-33%) Wound Description Classification: Full Thickness Without Exposed Support Structures Exudate Amount: Medium Exudate Type: Serosanguineous Exudate Color: red, brown Foul Odor After Cleansing: No Slough/Fibrino Yes Wound Bed Granulation Amount: Small (1-33%) Exposed Structure Granulation Quality: Pink Fat Layer (Subcutaneous Tissue) Exposed: Yes Necrotic Amount: Large (67-100%) Necrotic Quality: Eschar, Adherent Slough Periwound Skin Texture Texture Color No Abnormalities Noted: No No Abnormalities Noted: Yes Scarring: Yes Temperature / Pain Temperature: No Abnormality Moisture No Abnormalities Noted: Yes Treatment Notes Wound #6 (Lower Leg) Wound Laterality: Left, Anterior Cleanser Soap and Water Discharge Instruction: May shower and wash wound with dial antibacterial soap and water prior to dressing change. Vashe 5.8 (oz) Discharge Instruction: Cleanse the wound with Vashe prior to applying a clean dressing using gauze sponges, not tissue or cotton balls. Wound Cleanser Discharge Instruction: Cleanse the wound with wound cleanser prior to applying a clean dressing using gauze sponges, not tissue or cotton balls. Peri-Wound Care Topical Primary Dressing Hydrofera Blue Ready Transfer Foam, 4x5 (in/in) Discharge Instruction: Apply to wound bed as instructed MediHoney Gel, tube 1.5 (oz) Discharge Instruction: Apply to wound bed as  instructed GANESH, DEEG ([id number]) [id number] Page 8 of 11 Secondary Dressing ABD Pad, 5x9 Discharge Instruction: Apply over primary dressing as directed. Woven Gauze Sponge, Non-Sterile 4x4 in Discharge Instruction: Apply over primary dressing as directed. Secured With [employer], 4.5x3.1 (in/yd) Discharge Instruction: Secure with Kerlix as directed. Paper Tape, 2x10 (in/yd) Discharge Instruction: Secure dressing with tape as directed. Tubigrip Size E, 3.5x10 (in/yds) Compression Wrap Compression Stockings Add-Ons Electronic Signature(s) Signed: 07/29/2022 2:28:10 PM By: Karl Ito Entered By: Karl Ito on 07/17/2022 11:51:07 -------------------------------------------------------------------------------- Wound Assessment Details Patient Name: Date of Service: [name], Kenneth MES L. 07/17/2022 11:15 A M Medical Record Number: [id number] Patient Account Number: [id number] Date of Birth/Sex: Treating RN: 1935/11/24 (87 y.o. M) Primary Care Zeah Germano: Docia Chuck, Dibas Other Clinician: Referring Josefine Fuhr: Treating Brynne Doane/Extender: Eda Keys, Dibas Weeks in Treatment: 2 Wound Status Wound Number: 7 Primary Arterial Insufficiency Ulcer Etiology: Wound Location: Left, Lateral Ankle Wound Status: Open Wounding Event: Gradually Appeared Comorbid Cataracts, Angina, Type II Diabetes, Gout, Osteoarthritis, Date Acquired: 05/18/2022 History: Neuropathy Weeks Of Treatment: 2 Clustered Wound: No Photos Wound Measurements Length: (cm) 2 Width: (cm) 1 Depth: (cm) 0.2 Area: (cm) 1.571 Volume: (cm) 0.314 % Reduction in Area: 0% % Reduction in Volume: 0% Epithelialization: Small (1-33%) Wound Description Classification: Full Thickness Without Exposed Support Structures Exudate Amount: Medium Exudate Type: Serosanguineous Exudate Color: red, brown FILIMON, MIRANDA ([id number]) Foul Odor After Cleansing: No Slough/Fibrino  Yes [id number] Page 9 of 11 Wound Bed Granulation Amount: Small (1-33%) Exposed Structure Granulation Quality: Red Fat Layer (Subcutaneous Tissue) Exposed: Yes Necrotic Amount: Large (67-100%) Necrotic Quality: Eschar, Adherent Slough Periwound Skin Texture Texture Color No Abnormalities Noted: No No Abnormalities Noted: Yes Scarring: Yes Temperature / Pain Temperature: No Abnormality Moisture  No Abnormalities Noted: Yes Treatment Notes Wound #7 (Ankle) Wound Laterality: Left, Lateral Cleanser Soap and Water Discharge Instruction: May shower and wash wound with dial antibacterial soap and water prior to dressing change. Vashe 5.8 (oz) Discharge Instruction: Cleanse the wound with Vashe prior to applying a clean dressing using gauze sponges, not tissue or cotton balls. Wound Cleanser Discharge Instruction: Cleanse the wound with wound cleanser prior to applying a clean dressing using gauze sponges, not tissue or cotton balls. Peri-Wound Care Topical Primary Dressing MediHoney Gel, tube 1.5 (oz) Discharge Instruction: Apply to wound bed as instructed Secondary Dressing ABD Pad, 5x9 Discharge Instruction: Apply over primary dressing as directed. Woven Gauze Sponge, Non-Sterile 4x4 in Discharge Instruction: Apply over primary dressing as directed. Secured With [employer], 4.5x3.1 (in/yd) Discharge Instruction: Secure with Kerlix as directed. Paper Tape, 2x10 (in/yd) Discharge Instruction: Secure dressing with tape as directed. Tubigrip Size E, 3.5x10 (in/yds) Compression Wrap Compression Stockings Add-Ons Electronic Signature(s) Signed: 07/29/2022 2:28:10 PM By: Karl Ito Entered By: Karl Ito on 07/17/2022 11:52:31 -------------------------------------------------------------------------------- Wound Assessment Details Patient Name: Date of Service: [name], Kenneth MES L. 07/17/2022 11:15 A M Medical Record Number:  [id number] Patient Account Number: [id number] Date of Birth/Sex: Treating RN: 1935-09-16 (87 y.o. [name] Primary Care Bartolo Montanye: Docia Chuck, Dibas Other Clinician: Referring Greg Cratty: Treating Taneal Sonntag/Extender: Eda Keys, Dibas Weeks in Treatment: 2 PRAISE, DOLECKI ([id number]) [id number] Page 10 of 11 Wound Status Wound Number: 8 Primary Abrasion Etiology: Wound Location: Right, Anterior Lower Leg Wound Status: Healed - Epithelialized Wounding Event: Gradually Appeared Comorbid Cataracts, Angina, Type II Diabetes, Gout, Osteoarthritis, Date Acquired: 07/17/2022 History: Neuropathy Weeks Of Treatment: 0 Clustered Wound: Yes Photos Wound Measurements Length: (cm) Width: (cm) Depth: (cm) Area: (cm) Volume: (cm) 0 % Reduction in Area: 0 % Reduction in Volume: 0 Epithelialization: Large (67-100%) 0 Tunneling: No 0 Undermining: No Wound Description Classification: Full Thickness Without Exposed Support Structures Exudate Amount: None Present Foul Odor After Cleansing: No Slough/Fibrino No Wound Bed Granulation Amount: None Present (0%) Exposed Structure Necrotic Amount: None Present (0%) Fascia Exposed: No Fat Layer (Subcutaneous Tissue) Exposed: No Tendon Exposed: No Muscle Exposed: No Joint Exposed: No Bone Exposed: No Periwound Skin Texture Texture Color No Abnormalities Noted: Yes No Abnormalities Noted: Yes Moisture Temperature / Pain No Abnormalities Noted: Yes Temperature: No Abnormality Electronic Signature(s) Signed: 07/17/2022 2:18:28 PM By: Karie Schwalbe RN Entered By: Karie Schwalbe on 07/17/2022 12:31:24 -------------------------------------------------------------------------------- Vitals Details Patient Name: Date of Service: Kenneth Ina MES L. 07/17/2022 11:15 A M Medical Record Number: [id number] Patient Account Number: [id number] Date of Birth/Sex: Treating RN: 1935/02/13 (87 y.o. M) Primary Care  Anderson Coppock: Docia Chuck, Dibas Other Clinician: Referring Dariush Mcnellis: Treating Mikkel Charrette/Extender: Eda Keys, Dibas Weeks in Treatment: 2 Vital Signs JAVONTA, GRONAU ([id number]) [id number] Page 11 of 11 Time Taken: 11:46 Temperature (F): 97.7 Height (in): 68 Pulse (bpm): 76 Weight (lbs): 190 Respiratory Rate (breaths/min): 18 Body Mass Index (BMI): 28.9 Blood Pressure (mmHg): 90/53 Reference Range: 80 - 120 mg / dl Electronic Signature(s) Signed: 07/17/2022 1:10:34 PM By: Thayer Dallas Entered By: Thayer Dallas on 07/17/2022 11:46:31

## 2022-07-31 ENCOUNTER — Encounter (HOSPITAL_BASED_OUTPATIENT_CLINIC_OR_DEPARTMENT_OTHER): Payer: Medicare Other | Admitting: Internal Medicine

## 2022-07-31 DIAGNOSIS — Z87891 Personal history of nicotine dependence: Secondary | ICD-10-CM | POA: Diagnosis not present

## 2022-07-31 DIAGNOSIS — I70245 Atherosclerosis of native arteries of left leg with ulceration of other part of foot: Secondary | ICD-10-CM

## 2022-07-31 DIAGNOSIS — L97522 Non-pressure chronic ulcer of other part of left foot with fat layer exposed: Secondary | ICD-10-CM | POA: Diagnosis not present

## 2022-07-31 DIAGNOSIS — E11622 Type 2 diabetes mellitus with other skin ulcer: Secondary | ICD-10-CM | POA: Diagnosis not present

## 2022-07-31 DIAGNOSIS — E1151 Type 2 diabetes mellitus with diabetic peripheral angiopathy without gangrene: Secondary | ICD-10-CM | POA: Diagnosis not present

## 2022-07-31 DIAGNOSIS — L97822 Non-pressure chronic ulcer of other part of left lower leg with fat layer exposed: Secondary | ICD-10-CM | POA: Diagnosis not present

## 2022-07-31 DIAGNOSIS — E11621 Type 2 diabetes mellitus with foot ulcer: Secondary | ICD-10-CM | POA: Diagnosis not present

## 2022-07-31 DIAGNOSIS — I87312 Chronic venous hypertension (idiopathic) with ulcer of left lower extremity: Secondary | ICD-10-CM

## 2022-07-31 DIAGNOSIS — Z7984 Long term (current) use of oral hypoglycemic drugs: Secondary | ICD-10-CM | POA: Diagnosis not present

## 2022-07-31 DIAGNOSIS — I4891 Unspecified atrial fibrillation: Secondary | ICD-10-CM | POA: Diagnosis not present

## 2022-07-31 NOTE — Progress Notes (Signed)
TORAN, WIMPY ([id number]) [id number] Page 1 of 9 Visit Report for 07/31/2022 Arrival Information Details Patient Name: Date of Service: [name], Kenneth MES L. 07/31/2022 10:30 A M Medical Record Number: [id number] Patient Account Number: [id number] Date of Birth/Sex: Treating RN: 1935/02/03 (87 y.o. M) Primary Care Fruma [place]: Docia Chuck, Dibas Other Clinician: Referring Kady Toothaker: Treating Earlena Werst/Extender: Eda Keys, Dibas Weeks in Treatment: 4 Visit Information History Since Last Visit Added or deleted any medications: No Patient Arrived: Walker Any new allergies or adverse reactions: No Arrival Time: 10:40 Had a fall or experienced change in No Accompanied By: daughter activities of daily living that may affect Transfer Assistance: None risk of falls: Patient Identification Verified: Yes Signs or symptoms of abuse/neglect since last visito No Secondary Verification Process Completed: Yes Hospitalized since last visit: No Patient Has Alerts: Yes Implantable device outside of the clinic excluding No Patient Alerts: ABI R 0.89 (06/05/22) cellular tissue based products placed in the center ABI L 0.52 (06/05/22) since last visit: Has Dressing in Place as Prescribed: Yes Pain Present Now: Yes Electronic Signature(s) Signed: 07/31/2022 11:46:38 AM By: Karl Ito Entered By: Karl Ito on 07/31/2022 10:44:40 -------------------------------------------------------------------------------- Encounter Discharge Information Details Patient Name: Date of Service: Kenneth Ina MES L. 07/31/2022 10:30 A M Medical Record Number: [id number] Patient Account Number: [id number] Date of Birth/Sex: Treating RN: 05-Jul-1935 (87 y.o. [name] Primary Care Jeffrey Graefe: Docia Chuck, Dibas Other Clinician: Referring Sion Reinders: Treating Miko Sirico/Extender: Eda Keys, Dibas Weeks in Treatment: 4 Encounter Discharge Information Items Post Procedure  Vitals Discharge Condition: Stable Temperature (F): 98.7 Ambulatory Status: Wheelchair Pulse (bpm): 92 Discharge Destination: Home Respiratory Rate (breaths/min): 18 Transportation: Private Auto Blood Pressure (mmHg): 109/80 Accompanied By: daughter Schedule Follow-up Appointment: Yes Clinical Summary of Care: Electronic Signature(s) Signed: 07/31/2022 4:29:32 PM By: Shawn Stall RN, BSN Entered By: Shawn Stall on 07/31/2022 11:32:19 [name] ([id number]) [id number] Page 2 of 9 -------------------------------------------------------------------------------- Lower Extremity Assessment Details Patient Name: Date of Service: [name], Kenneth MES L. 07/31/2022 10:30 A M Medical Record Number: [id number] Patient Account Number: [id number] Date of Birth/Sex: Treating RN: [birth year] (87 y.o. M) Primary Care Keilee Denman: Docia Chuck, Dibas Other Clinician: Referring Alannie Amodio: Treating Nioka Thorington/Extender: Eda Keys, Dibas Weeks in Treatment: 4 Edema Assessment Assessed: [Left: No] [Right: No] [Left: Edema] [Right: :] Calf Left: Right: Point of Measurement: From Medial Instep 35.5 cm Ankle Left: Right: Point of Measurement: From Medial Instep 22.5 cm Electronic Signature(s) Signed: 07/31/2022 1:18:09 PM By: Thayer Dallas Entered By: Thayer Dallas on 07/31/2022 10:56:29 -------------------------------------------------------------------------------- Multi Wound Chart Details Patient Name: Date of Service: Kenneth Ina MES L. 07/31/2022 10:30 A M Medical Record Number: [id number] Patient Account Number: [id number] Date of Birth/Sex: Treating RN: 1935/05/29 (87 y.o. M) Primary Care Meenakshi Sazama: Docia Chuck, Dibas Other Clinician: Referring Cleave Ternes: Treating Dravin Lance/Extender: Eda Keys, Dibas Weeks in Treatment: 4 Vital Signs Height(in): 68 Pulse(bpm): 92 Weight(lbs): 190 Blood Pressure(mmHg): 109/60 Body Mass Index(BMI):  28.9 Temperature(F): 98.7 Respiratory Rate(breaths/min): 18 [6:Photos:] [N/A:N/A] Left, Anterior Lower Leg Left, Lateral Ankle N/A Wound Location: Gradually Appeared Gradually Appeared N/A Wounding Event: Arterial Insufficiency Ulcer Arterial Insufficiency Ulcer N/A Primary Etiology: Cataracts, Angina, Type II Diabetes, Cataracts, Angina, Type II Diabetes, N/A Comorbid History: Gout, Osteoarthritis, Neuropathy Gout, Osteoarthritis, Neuropathy 05/18/2022 05/18/2022 N/A Date Acquired: [name] ([id number]) [id number] Page 3 of 9 4 4  N/A Weeks of Treatment: Open Open N/A Wound Status: No No N/A Wound Recurrence: 8x7x0.1 2x1.3x0.2 N/A Measurements L x W x D (cm) 43.982 2.042 N/A A (cm) : rea 4.398 0.408 N/A  Volume (cm) : -1.40% -30.00% N/A % Reduction in Area: -1.40% -29.90% N/A % Reduction in Volume: Full Thickness Without Exposed Full Thickness Without Exposed N/A Classification: Support Structures Support Structures Medium Medium N/A Exudate A mount: Serosanguineous Serosanguineous N/A Exudate Type: red, brown red, brown N/A Exudate Color: Medium (34-66%) Large (67-100%) N/A Granulation A mount: Pink Red N/A Granulation Quality: Small (1-33%) Small (1-33%) N/A Necrotic A mount: Fat Layer (Subcutaneous Tissue): Yes Fat Layer (Subcutaneous Tissue): Yes N/A Exposed Structures: Small (1-33%) Small (1-33%) N/A Epithelialization: Debridement - Excisional N/A N/A Debridement: Pre-procedure Verification/Time Out 11:20 N/A N/A Taken: Lidocaine 4% Topical Solution N/A N/A Pain Control: Subcutaneous, Slough N/A N/A Tissue Debrided: Skin/Subcutaneous Tissue N/A N/A Level: 8.79 N/A N/A Debridement A (sq cm): rea Curette N/A N/A Instrument: Minimum N/A N/A Bleeding: Pressure N/A N/A Hemostasis A chieved: 0 N/A N/A Procedural Pain: 0 N/A N/A Post Procedural Pain: Procedure was tolerated well N/A N/A Debridement Treatment  Response: 8x7x0.1 N/A N/A Post Debridement Measurements L x W x D (cm) 4.398 N/A N/A Post Debridement Volume: (cm) Scarring: Yes Scarring: Yes N/A Periwound Skin Texture: No Abnormalities Noted No Abnormalities Noted N/A Periwound Skin Moisture: No Abnormalities Noted No Abnormalities Noted N/A Periwound Skin Color: No Abnormality No Abnormality N/A Temperature: Debridement N/A N/A Procedures Performed: Treatment Notes Wound #6 (Lower Leg) Wound Laterality: Left, Anterior Cleanser Soap and Water Discharge Instruction: May shower and wash wound with dial antibacterial soap and water prior to dressing change. Vashe 5.8 (oz) Discharge Instruction: Cleanse the wound with Vashe prior to applying a clean dressing using gauze sponges, not tissue or cotton balls. Wound Cleanser Discharge Instruction: Cleanse the wound with wound cleanser prior to applying a clean dressing using gauze sponges, not tissue or cotton balls. Peri-Wound Care Sween Lotion (Moisturizing lotion) Discharge Instruction: Apply moisturizing lotion as directed Topical topical antibiotics [facility] pharmacy) Discharge Instruction: apply directly to wound bed. Primary Dressing Maxorb Extra CMC/Alginate Dressing, 4x4 (in/in) Discharge Instruction: Apply to wound bed as instructed Secondary Dressing ABD Pad, 5x9 Discharge Instruction: Apply over primary dressing as directed. Woven Gauze Sponge, Non-Sterile 4x4 in Discharge Instruction: Apply over primary dressing as directed. Secured With Compression Wrap Kerlix Roll 4.5x3.1 (in/yd) Discharge Instruction: Apply Kerlix and Coban compression as directed. Coban Self-Adherent Wrap 4x5 (in/yd) Discharge Instruction: Apply over Kerlix as directed. ARGELIS, FAVREAU ([id number]) [id number] Page 4 of 9 Compression Stockings Add-Ons Wound #7 (Ankle) Wound Laterality: Left, Lateral Cleanser Soap and Water Discharge Instruction: May shower and  wash wound with dial antibacterial soap and water prior to dressing change. Vashe 5.8 (oz) Discharge Instruction: Cleanse the wound with Vashe prior to applying a clean dressing using gauze sponges, not tissue or cotton balls. Wound Cleanser Discharge Instruction: Cleanse the wound with wound cleanser prior to applying a clean dressing using gauze sponges, not tissue or cotton balls. Peri-Wound Care Sween Lotion (Moisturizing lotion) Discharge Instruction: Apply moisturizing lotion as directed Topical topical antibiotics [facility] pharmacy) Discharge Instruction: apply directly to wound bed. Primary Dressing Maxorb Extra CMC/Alginate Dressing, 4x4 (in/in) Discharge Instruction: Apply to wound bed as instructed Secondary Dressing ABD Pad, 5x9 Discharge Instruction: Apply over primary dressing as directed. Woven Gauze Sponge, Non-Sterile 4x4 in Discharge Instruction: Apply over primary dressing as directed. Secured With Compression Wrap Kerlix Roll 4.5x3.1 (in/yd) Discharge Instruction: Apply Kerlix and Coban compression as directed. Coban Self-Adherent Wrap 4x5 (in/yd) Discharge Instruction: Apply over Kerlix as directed. Compression Stockings Add-Ons Electronic Signature(s) Signed: 07/31/2022 12:44:10 PM By: Geralyn Corwin DO Entered By: Mikey Bussing,  Jessica on 07/31/2022 11:32:59 -------------------------------------------------------------------------------- Multi-Disciplinary Care Plan Details Patient Name: Date of Service: [name], Kenneth MES L. 07/31/2022 10:30 A M Medical Record Number: [id number] Patient Account Number: [id number] Date of Birth/Sex: Treating RN: [birth year] (87 y.o. [name] Primary Care Shikita Vaillancourt: Docia Chuck, Dibas Other Clinician: Referring Dozier Berkovich: Treating Hilde Churchman/Extender: Eda Keys, Dibas Weeks in Treatment: 4 Active Inactive Wound/Skin Impairment Nursing Diagnoses: BRAXLEY, BLISSETT ([id number]) [id number] Page  5 of 9 Impaired tissue integrity Goals: Patient/caregiver will verbalize understanding of skin care regimen Date Initiated: 07/02/2022 Target Resolution Date: 11/04/2022 Goal Status: Active Interventions: Provide education on smoking Treatment Activities: Skin care regimen initiated : 07/02/2022 Notes: Electronic Signature(s) Signed: 07/31/2022 4:29:32 PM By: Shawn Stall RN, BSN Entered By: Shawn Stall on 07/31/2022 11:11:13 -------------------------------------------------------------------------------- Pain Assessment Details Patient Name: Date of Service: Kenneth Ina MES L. 07/31/2022 10:30 A M Medical Record Number: [id number] Patient Account Number: [id number] Date of Birth/Sex: Treating RN: 15-Aug-1935 (87 y.o. M) Primary Care Tauheedah Bok: Docia Chuck, Dibas Other Clinician: Referring Cayleigh Paull: Treating Rechelle Niebla/Extender: Eda Keys, Dibas Weeks in Treatment: 4 Active Problems Location of Pain Severity and Description of Pain Patient Has Paino Yes Site Locations Pain Location: Generalized Pain Rate the pain. Current Pain Level: 2 Pain Management and Medication Current Pain Management: Electronic Signature(s) Signed: 07/31/2022 1:18:09 PM By: Thayer Dallas Entered By: Thayer Dallas on 07/31/2022 10:52:24 [name] ([id number]) [id number] Page 6 of 9 -------------------------------------------------------------------------------- Patient/Caregiver Education Details Patient Name: Date of Service: GILMAR, MUCKLOW MES L. 7/26/2024andnbsp10:30 A M Medical Record Number: [id number] Patient Account Number: [id number] Date of Birth/Gender: Treating RN: [birth year] (87 y.o. [name] Primary Care Physician: Docia Chuck, Dibas Other Clinician: Referring Physician: Treating Physician/Extender: Eda Keys, Dibas Weeks in Treatment: 4 Education Assessment Education Provided To: Patient Education Topics Provided Wound/Skin  Impairment: Handouts: Caring for Your Ulcer Methods: Explain/Verbal Responses: Reinforcements needed Electronic Signature(s) Signed: 07/31/2022 4:29:32 PM By: Shawn Stall RN, BSN Entered By: Shawn Stall on 07/31/2022 11:11:22 -------------------------------------------------------------------------------- Wound Assessment Details Patient Name: Date of Service: Kenneth Ina MES L. 07/31/2022 10:30 A M Medical Record Number: [id number] Patient Account Number: [id number] Date of Birth/Sex: Treating RN: 1935/12/17 (87 y.o. 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, Anterior Lower Leg Wound Status: Open Wounding Event: Gradually Appeared Comorbid Cataracts, Angina, Type II Diabetes, Gout, Osteoarthritis, Date Acquired: 05/18/2022 History: Neuropathy Weeks Of Treatment: 4 Clustered Wound: No Photos Wound Measurements Length: (cm) 8 Width: (cm) 7 Depth: (cm) 0.1 Area: (cm) 43.982 Volume: (cm) 4.398 % Reduction in Area: -1.4% % Reduction in Volume: -1.4% Epithelialization: Small (1-33%) Tunneling: No Undermining: No Wound Description [name], [name] ([id number]) Classification: Full Thickness Without Exposed Support Structures Exudate Amount: Medium Exudate Type: Serosanguineous Exudate Color: red, brown [id number] Page 7 of 9 Foul Odor After Cleansing: No Slough/Fibrino Yes Wound Bed Granulation Amount: Medium (34-66%) Exposed Structure Granulation Quality: Pink Fat Layer (Subcutaneous Tissue) Exposed: Yes Necrotic Amount: Small (1-33%) Necrotic Quality: Adherent Slough Periwound Skin Texture Texture Color No Abnormalities Noted: No No Abnormalities Noted: Yes Scarring: Yes Temperature / Pain Temperature: No  Abnormality Moisture No Abnormalities Noted: Yes Treatment Notes Wound #6 (Lower Leg) Wound Laterality: Left, Anterior Cleanser Soap and Water Discharge Instruction: May shower and wash wound with dial antibacterial soap and water prior to dressing change. Vashe 5.8 (oz) Discharge Instruction: Cleanse the wound with Vashe prior to applying a clean  dressing using gauze sponges, not tissue or cotton balls. Wound Cleanser Discharge Instruction: Cleanse the wound with wound cleanser prior to applying a clean dressing using gauze sponges, not tissue or cotton balls. Peri-Wound Care Sween Lotion (Moisturizing lotion) Discharge Instruction: Apply moisturizing lotion as directed Topical topical antibiotics [facility] pharmacy) Discharge Instruction: apply directly to wound bed. Primary Dressing Maxorb Extra CMC/Alginate Dressing, 4x4 (in/in) Discharge Instruction: Apply to wound bed as instructed Secondary Dressing ABD Pad, 5x9 Discharge Instruction: Apply over primary dressing as directed. Woven Gauze Sponge, Non-Sterile 4x4 in Discharge Instruction: Apply over primary dressing as directed. Secured With Compression Wrap Kerlix Roll 4.5x3.1 (in/yd) Discharge Instruction: Apply Kerlix and Coban compression as directed. Coban Self-Adherent Wrap 4x5 (in/yd) Discharge Instruction: Apply over Kerlix as directed. Compression Stockings Add-Ons Electronic Signature(s) Signed: 07/31/2022 1:18:09 PM By: Thayer Dallas Entered By: Thayer Dallas on 07/31/2022 10:54:15 [name] ([id number]) [id number] Page 8 of 9 -------------------------------------------------------------------------------- Wound Assessment Details Patient Name: Date of Service: [name], Kenneth MES L. 07/31/2022 10:30 A M Medical Record Number: [id number] Patient Account Number: [id number] Date of Birth/Sex: Treating RN: September 18, 1935 (87 y.o. 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, Lateral Ankle Wound Status: Open Wounding Event: Gradually Appeared Comorbid Cataracts, Angina, Type II Diabetes, Gout, Osteoarthritis, Date Acquired: 05/18/2022 History: Neuropathy Weeks Of Treatment: 4 Clustered Wound: No Photos Wound Measurements Length: (cm) 2 Width: (cm) 1.3 Depth: (cm) 0.2 Area: (cm) 2.042 Volume: (cm) 0.408 % Reduction in Area: -30% % Reduction in Volume: -29.9% Epithelialization: Small (1-33%) Tunneling: No Undermining: No Wound Description Classification: Full Thickness Without Exposed Support Structures Exudate Amount: Medium Exudate Type: Serosanguineous Exudate Color: red, brown Foul Odor After Cleansing: No Slough/Fibrino Yes Wound Bed Granulation Amount: Large (67-100%) Exposed Structure Granulation Quality: Red Fat Layer (Subcutaneous Tissue) Exposed: Yes Necrotic Amount: Small (1-33%) Necrotic Quality: Adherent Slough Periwound Skin Texture Texture Color No Abnormalities Noted: No No Abnormalities Noted: Yes Scarring: Yes Temperature / Pain Temperature: No Abnormality Moisture No Abnormalities Noted: Yes Treatment Notes Wound #7 (Ankle) Wound Laterality: Left, Lateral Cleanser Soap and Water Discharge Instruction: May shower and wash wound with dial antibacterial soap and water prior to dressing change. Vashe 5.8 (oz) Discharge Instruction: Cleanse the wound with Vashe prior to applying a clean dressing using gauze sponges, not tissue or cotton balls. Wound Cleanser Discharge Instruction: Cleanse the wound with wound cleanser prior to applying a clean dressing using gauze sponges, not tissue or cotton balls. Peri-Wound Care Sween Lotion (Moisturizing lotion) Discharge Instruction: Apply moisturizing lotion as  directed [name], [name] ([id number]) [id number] Page 9 of 9 Topical topical antibiotics [facility] pharmacy) Discharge Instruction: apply directly to wound bed. Primary Dressing Maxorb Extra CMC/Alginate Dressing, 4x4 (in/in) Discharge Instruction: Apply to wound bed as instructed Secondary Dressing ABD Pad, 5x9 Discharge Instruction: Apply over primary dressing as directed. Woven Gauze Sponge, Non-Sterile 4x4 in Discharge Instruction: Apply over primary dressing as directed. Secured With Compression Wrap Kerlix Roll 4.5x3.1 (in/yd) Discharge Instruction: Apply Kerlix and Coban compression as directed. Coban Self-Adherent Wrap 4x5 (in/yd) Discharge Instruction: Apply over Kerlix as directed. Compression Stockings Add-Ons Electronic Signature(s) Signed: 07/31/2022 1:18:09 PM By: Thayer Dallas Entered By: Thayer Dallas on 07/31/2022 10:54:58 -------------------------------------------------------------------------------- Vitals Details Patient Name: Date of Service: Kenneth Ina MES L. 07/31/2022 10:30 A M Medical Record Number: [id number] Patient Account Number: [id number] Date of Birth/Sex: Treating RN: [birth year] (  87 y.o. M) Primary Care Tannor Pyon: Docia Chuck, Dibas Other Clinician: Referring Tata Timmins: Treating Arya Boxley/Extender: Eda Keys, Dibas Weeks in Treatment: 4 Vital Signs Time Taken: 10:51 Temperature (F): 98.7 Height (in): 68 Pulse (bpm): 92 Weight (lbs): 190 Respiratory Rate (breaths/min): 18 Body Mass Index (BMI): 28.9 Blood Pressure (mmHg): 109/60 Reference Range: 80 - 120 mg / dl Electronic Signature(s) Signed: 07/31/2022 1:18:09 PM By: Thayer Dallas Entered By: Thayer Dallas on 07/31/2022 10:52:10

## 2022-07-31 NOTE — Progress Notes (Signed)
[name] ([id number]) [id number] Page 1 of 11 Visit Report for 07/31/2022 Chief Complaint Document Details Patient Name: Date of Service: [name], [name] Kenneth L. 07/31/2022 10:30 A M Medical Record Number: [id number] Patient Account Number: [id number] Date of Birth/Sex: Treating RN: 25-Oct-1935 (87 y.o. M) Primary Care Provider: Docia Chuck, Dibas Other Clinician: Referring Provider: Treating Provider/Extender: Eda Keys, Dibas Weeks in Treatment: 4 Information Obtained from: Patient Chief Complaint 07/02/2022; Left lower extremity wounds Electronic Signature(s) Signed: 07/31/2022 12:44:10 PM By: Geralyn Corwin DO Entered By: Geralyn Corwin on 07/31/2022 11:33:09 -------------------------------------------------------------------------------- Debridement Details Patient Name: Date of Service: [name] Kenneth L. 07/31/2022 10:30 A M Medical Record Number: [id number] Patient Account Number: [id number] Date of Birth/Sex: Treating RN: 29-Mar-1935 (87 y.o. M) Primary Care Provider: Docia Chuck, Dibas Other Clinician: Referring Provider: Treating Provider/Extender: Eda Keys, Dibas Weeks in Treatment: 4 Debridement Performed for Assessment: Wound #6 Left,Anterior Lower Leg Performed By: Physician Geralyn Corwin, DO Debridement Type: Debridement Severity of Tissue Pre Debridement: Fat layer exposed Level of Consciousness (Pre-procedure): Awake and Alert Pre-procedure Verification/Time Out Yes - 11:20 Taken: Start Time: 11:21 Pain Control: Lidocaine 4% T opical Solution Percent of Wound Bed Debrided: 20% T Area Debrided (cm): otal 8.79 Tissue and other material debrided: Viable, Non-Viable, Slough, Skin: Dermis , Skin: Epidermis, Slough Level: Skin/Epidermis Debridement Description: Selective/Open Wound Instrument: Curette Bleeding: Minimum Hemostasis Achieved: Pressure End Time: 11:25 Procedural Pain: 0 Post Procedural Pain:  0 Response to Treatment: Procedure was tolerated well Level of Consciousness (Post- Awake and Alert procedure): Post Debridement Measurements of Total Wound Length: (cm) 8 Width: (cm) 7 Depth: (cm) 0.1 Volume: (cm) 4.398 Character of Wound/Ulcer Post Debridement: Improved Kenneth, Hood ([id number]) [id number] Page 2 of 11 Severity of Tissue Post Debridement: Fat layer exposed Post Procedure Diagnosis Same as Pre-procedure Electronic Signature(s) Signed: 07/31/2022 12:44:10 PM By: Geralyn Corwin DO Entered By: Geralyn Corwin on 07/31/2022 12:02:36 -------------------------------------------------------------------------------- HPI Details Patient Name: Date of Service: [name] Kenneth L. 07/31/2022 10:30 A M Medical Record Number: [id number] Patient Account Number: [id number] Date of Birth/Sex: Treating RN: [birth year] (87 y.o. M) Primary Care Provider: Docia Chuck, Dibas Other Clinician: Referring Provider: Treating Provider/Extender: Eda Keys, Dibas Weeks in Treatment: 4 History of Present Illness HPI Description: Admission 08/26/2021 [name] is an 87 year old male with a past medical history of controlled type 2 diabetes on oral agents with peripheral neuropathy and Atrial fibrillation not on blood thinners that presents to the clinic with a right foot wound that has waxed and waned in healing over the past year. He has peripheral neuropathy and is not sure when the wound started or how it started. He had an MRI done by his primary care physician on 08/03/2021 that showed potential early acute osteomyelitis at the posterior calcaneus. He was seen by Dr. Luciana Axe on 08/06/2021 and started on doxycycline and cefadroxil due to these findings. He was evaluated by podiatry, Dr. Ardelle Anton and had a bone biopsy done on 08/13/2021 that did not show evidence of osteomyelitis. He had a culture that showed MRSA sensitive to doxycycline. He also had ABIs  with TBI's done on 8/2 that showed an ABI of 0.58 and a noncompressible TBI. He is scheduled to see vein and vascular on 8/30. Currently he is keeping the wound covered. He is using a soft surgical shoe. He denies signs of infection. 8/5; patient presents for follow-up. He had follow-up with Dr. Luciana Axe On 8/23. Plan is for 6 weeks of doxycycline through September 16. Although path was negative  for osteo there is still concern due to positive culture from the bone. Unfortunately patient missed his vein and vascular appointment to assess blood flow status. This has been rescheduled for 9/8. Patient has been using Medihoney to the wound bed. He tries to offload the wound bed but has trouble at night keeping pressure off of it. He does not have Prevalon boots. He currently denies signs of infection. 9/19; patient presents for follow-up. He is scheduled to have a bilateral lower extremity angiogram on 9/20. He currently denies signs of infection. He has been using Medihoney to the wound bed. 11/13; patient has missed his last follow-up. In fact he has not been here in 2 months. He had bilateral iliofemoral endarterectomy with retrograde iliac stenting on 10/09/2021. He has been using Medihoney with Hydrofera Blue to the heel wound. He reports improvement in wound healing here. 2 weeks ago he developed a wound to the dorsal aspect of the great toe. He states His shoes cause this issue and he is currently wearing different shoes. He is on clindamycin as there was concern for infection to the right great toe. He currently denies signs of infection. He has been keeping the great toe open to air. 12/4; patient presents for follow-up. He has been using Medihoney and Hydrofera Blue to the heel wound and right great toe wound. The heel wound is closed. The right great toe wound has bone exposed. He now has a new wound to the right ankle that he states he used to shoehorn to scratch the area and created the wound. He  has not been dressing this area. 12/21; patient presents for follow-up. He has been using Hydrofera Blue and Medihoney to the right anterior leg wound and right toe wound. There is been improvement in wound healing. He has been taking doxycycline prescribed at last clinic visit. He has no issues or complaints today. 1/5; patient presents for follow-up. He has been using Hydrofera Blue and Medihoney to the right anterior leg wound and right toe wound. Unfortunately has developed a new wound to the left lower extremity. He is not sure how this started. He has completed his course of doxycycline. He denies signs of infection. He is scheduled to see vein and vascular for follow-up on 1/12. 1/18; patient presents for follow-up. He saw vein and vascular on 1/12 for evaluation of a new wound to his left lower extremity. They recommended he continue to elevate his legs. They stated that if the wound became worse or did not improve he will need repeat angiogram. Currently patient denies signs of infection. He has been using Medihoney and Hydrofera Blue to the wound beds. 2/6; patient missed his last clinic appointment. He has been using Medihoney and Hydrofera Blue to the wound beds. He saw vein and vascular on 2/2. No plan for angiography at this time. Patient has no issues or complaints today. 2/20; patient presents for follow-up. He has been using Santyl and Hydrofera Blue to the wound beds however he admits to not dressing the wound beds consistently and leaving them open to air. He denies signs of infection. 3/26; patient presents for follow-up. He had a left lower extremity arteriogram on 3/13. 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 It was recommended that patient would benefit from redo left common femoral artery exposure, femoral to below-knee popliteal artery bypass. At this time patient is going to think about it. He has follow-up with vein and vascular next  month. He  has been using Santyl and Hydrofera Blue to the wound bed. He has developed a new skin tear to his right knee after a fall. 07/02/2022; patient has been following in our clinic for his lower extremity wounds last seen 03/31/2022. Plan was for him to follow-up in 3 weeks after seeing vein and vascular. Unfortunately patient canceled his follow-up and never returned. He presents today as he wants to continue wound care. He has been using Santyl to the left lower extremity wound however states this is irritating his skin and he has stopped this and is using Medihoney. Vein and vascular had ordered home health however it is unclear if they are coming out as patient or daughter cannot tell me. He saw vein and vascular on 06/05/2022 and next recommendation was for a femoropopliteal bypass graft on the left. His wound has declined since I last saw him. He currently denies signs of infection. 7/12; Patient presents for follow-up. He has been using Medihoney and Hydrofera Blue to the wound bed. He has not been using any compression therapy. He Kenneth, Hood ([id number]) [id number] Page 3 of 11 has follow-up with vein and vascular in 1 week to discuss the femoropopliteal bypass graft. He currently denies signs of infection. 7/26; patient presents for follow-up. He had a PCR culture done at last clinic visit that grew mainly Pseudomonas aeruginosa. He said antibiotic ointment was ordered for him. He saw vein and vascular on 7/19 who is recommending more aggressive compression therapy. We have attempted to get home health for the patient and this includes 4 agencies but they have not excepted the patient due to insurance. We will try 1 more agency. Currently patient denies signs of infection. Electronic Signature(s) Signed: 07/31/2022 12:44:10 PM By: Geralyn Corwin DO Entered By: Geralyn Corwin on 07/31/2022  11:56:42 -------------------------------------------------------------------------------- Physical Exam Details Patient Name: Date of Service: [name] Kenneth L. 07/31/2022 10:30 A M Medical Record Number: [id number] Patient Account Number: [id number] Date of Birth/Sex: Treating RN: February 16, 1935 (87 y.o. M) Primary Care Provider: Docia Chuck, Dibas Other Clinician: Referring Provider: Treating Provider/Extender: Eda Keys, Dibas Weeks in Treatment: 4 Constitutional respirations regular, non-labored and within target range for patient.. Cardiovascular 2+ dorsalis pedis/posterior tibialis pulses. Psychiatric pleasant and cooperative. Notes Left lower extremity: Large open wound to the anterior aspect with granulation tissue and nonviable tissue. Lateral to this 3 open wound with granulation tissue and scant non viable tissue. No surrounding signs of infection. 2+ pitting edema to the knee. Electronic Signature(s) Signed: 07/31/2022 12:44:10 PM By: Geralyn Corwin DO Entered By: Geralyn Corwin on 07/31/2022 11:57:13 -------------------------------------------------------------------------------- Physician Orders Details Patient Name: Date of Service: [name] Kenneth L. 07/31/2022 10:30 A M Medical Record Number: [id number] Patient Account Number: [id number] Date of Birth/Sex: Treating RN: 06/04/1935 (87 y.o. [name] Primary Care Provider: Docia Chuck, Dibas Other Clinician: Referring Provider: Treating Provider/Extender: Eda Keys, Dibas Weeks in Treatment: 4 Verbal / Phone Orders: No Diagnosis Coding ICD-10 Coding Code Description 5178682552 Non-pressure chronic ulcer of other part of left lower leg with fat layer exposed L97.522 Non-pressure chronic ulcer of other part of left foot with fat layer exposed I87.312 Chronic venous hypertension (idiopathic) with ulcer of left lower extremity I70.245 Atherosclerosis of native arteries of left leg with ulceration  of other part of foot E11.621 Type 2 diabetes mellitus with foot ulcer E11.622 Type 2 diabetes mellitus with other skin ulcer [name], [name] ([id number]) [id number] Page 4 of 11 Follow-up Appointments ppointment in 1 week. - Dr. Mikey Bussing room  7 Thursday 0845 08/06/2022 Return A ppointment in 2 weeks. - Dr. Mikey Bussing room 9 Thursday 245pm 08/13/2022 Return A Nurse Visit: - Tuesday 330pm 08/04/2022 room 7 Other: - Will try [facility] ****Bring in [place] topical compounding antbiotics to each appt.***** Anesthetic Wound #6 Left,Anterior Lower Leg (In clinic) Topical Lidocaine 5% applied to wound bed Wound #7 Left,Lateral Ankle (In clinic) Topical Lidocaine 5% applied to wound bed Bathing/ Shower/ Hygiene May shower and wash wound with soap and water. - May change dressing after shower/bathj Edema Control - Lymphedema / SCD / Other Bilateral Lower Extremities Elevate legs to the level of the heart or above for 30 minutes daily and/or when sitting for 3-4 times a day throughout the day. - as tolerated Avoid standing for long periods of time. Exercise regularly - as tolerated Moisturize legs daily. Additional Orders / Instructions Follow Nutritious Diet - increase protein. Juven Shake 1-2 times daily. Non Wound Condition Other Non Wound Condition Orders/Instructions: - will apply gentamicin and mupirocin in clinic. Home Health Admit to Home Health for skilled nursing wound care. May utilize formulary equivalent dressing for wound treatment orders unless otherwise specified. Dressing changes to be completed by Home Health on Monday / Wednesday / Friday except when patient has scheduled visit at [facility]. - Please do wound care 3 x week (when pt. comes to [facility], this will count as 1 x wound care change) Wound Treatment Wound #6 - Lower Leg Wound Laterality: Left, Anterior Cleanser: Soap and Water 3 x Per Week/30 Days Discharge Instructions: May  shower and wash wound with dial antibacterial soap and water prior to dressing change. Cleanser: Vashe 5.8 (oz) 3 x Per Week/30 Days Discharge Instructions: Cleanse the wound with Vashe prior to applying a clean dressing using gauze sponges, not tissue or cotton balls. Cleanser: Wound Cleanser 3 x Per Week/30 Days Discharge Instructions: Cleanse the wound with wound cleanser prior to applying a clean dressing using gauze sponges, not tissue or cotton balls. Peri-Wound Care: Sween Lotion (Moisturizing lotion) 3 x Per Week/30 Days Discharge Instructions: Apply moisturizing lotion as directed Topical: topical antibiotics [facility] pharmacy) 3 x Per Week/30 Days Discharge Instructions: apply directly to wound bed. Prim Dressing: Maxorb Extra CMC/Alginate Dressing, 4x4 (in/in) 3 x Per Week/30 Days ary Discharge Instructions: Apply to wound bed as instructed Secondary Dressing: ABD Pad, 5x9 (Generic) 3 x Per Week/30 Days Discharge Instructions: Apply over primary dressing as directed. Secondary Dressing: Woven Gauze Sponge, Non-Sterile 4x4 in (Generic) 3 x Per Week/30 Days Discharge Instructions: Apply over primary dressing as directed. Compression Wrap: Kerlix Roll 4.5x3.1 (in/yd) 3 x Per Week/30 Days Discharge Instructions: Apply Kerlix and Coban compression as directed. Compression Wrap: Coban Self-Adherent Wrap 4x5 (in/yd) 3 x Per Week/30 Days Discharge Instructions: Apply over Kerlix as directed. Wound #7 - Ankle Wound Laterality: Left, Lateral Cleanser: Soap and Water 3 x Per Week/30 Days Discharge Instructions: May shower and wash wound with dial antibacterial soap and water prior to dressing change. Cleanser: Vashe 5.8 (oz) 3 x Per Week/30 Days Discharge Instructions: Cleanse the wound with Vashe prior to applying a clean dressing using gauze sponges, not tissue or cotton balls. Cleanser: Wound Cleanser 3 x Per Week/30 Days Discharge Instructions: Cleanse the wound with wound cleanser  prior to applying a clean dressing using gauze sponges, not tissue or cotton balls. Kenneth, Hood ([id number]) [id number] Page 5 of 11 Peri-Wound Care: Sween Lotion (Moisturizing lotion) 3 x Per Week/30 Days Discharge Instructions: Apply moisturizing lotion  as directed Topical: topical antibiotics [facility] pharmacy) 3 x Per Week/30 Days Discharge Instructions: apply directly to wound bed. Prim Dressing: Maxorb Extra CMC/Alginate Dressing, 4x4 (in/in) 3 x Per Week/30 Days ary Discharge Instructions: Apply to wound bed as instructed Secondary Dressing: ABD Pad, 5x9 (Generic) 3 x Per Week/30 Days Discharge Instructions: Apply over primary dressing as directed. Secondary Dressing: Woven Gauze Sponge, Non-Sterile 4x4 in (Generic) 3 x Per Week/30 Days Discharge Instructions: Apply over primary dressing as directed. Compression Wrap: Kerlix Roll 4.5x3.1 (in/yd) 3 x Per Week/30 Days Discharge Instructions: Apply Kerlix and Coban compression as directed. Compression Wrap: Coban Self-Adherent Wrap 4x5 (in/yd) 3 x Per Week/30 Days Discharge Instructions: Apply over Kerlix as directed. Electronic Signature(s) Signed: 07/31/2022 12:44:10 PM By: Geralyn Corwin DO Entered By: Geralyn Corwin on 07/31/2022 11:57:23 -------------------------------------------------------------------------------- Problem List Details Patient Name: Date of Service: [name] Kenneth L. 07/31/2022 10:30 A M Medical Record Number: [id number] Patient Account Number: [id number] Date of Birth/Sex: Treating RN: 01/23/1935 (87 y.o. [name] Primary Care Provider: Docia Chuck, Dibas Other Clinician: Referring Provider: Treating Provider/Extender: Eda Keys, Dibas Weeks in Treatment: 4 Active Problems ICD-10 Encounter Code Description Active Date MDM Diagnosis L97.822 Non-pressure chronic ulcer of other part of left lower leg with fat layer exposed6/27/2024 No Yes L97.522  Non-pressure chronic ulcer of other part of left foot with fat layer exposed 07/02/2022 No Yes I87.312 Chronic venous hypertension (idiopathic) with ulcer of left lower extremity 07/02/2022 No Yes I70.245 Atherosclerosis of native arteries of left leg with ulceration of other part of 07/02/2022 No Yes foot E11.621 Type 2 diabetes mellitus with foot ulcer 07/02/2022 No Yes E11.622 Type 2 diabetes mellitus with other skin ulcer 07/02/2022 No Yes [name], [name] ([id number]) [id number] Page 6 of 11 Inactive Problems Resolved Problems Electronic Signature(s) Signed: 07/31/2022 12:44:10 PM By: Geralyn Corwin DO Entered By: Geralyn Corwin on 07/31/2022 11:32:53 -------------------------------------------------------------------------------- Progress Note Details Patient Name: Date of Service: [name] Kenneth L. 07/31/2022 10:30 A M Medical Record Number: [id number] Patient Account Number: [id number] Date of Birth/Sex: Treating RN: 1935/05/15 (87 y.o. M) Primary Care Provider: Docia Chuck, Dibas Other Clinician: Referring Provider: Treating Provider/Extender: Eda Keys, Dibas Weeks in Treatment: 4 Subjective Chief Complaint Information obtained from Patient 07/02/2022; Left lower extremity wounds History of Present Illness (HPI) Admission 08/26/2021 Mr. Marcellino Alsup is an 87 year old male with a past medical history of controlled type 2 diabetes on oral agents with peripheral neuropathy and Atrial fibrillation not on blood thinners that presents to the clinic with a right foot wound that has waxed and waned in healing over the past year. He has peripheral neuropathy and is not sure when the wound started or how it started. He had an MRI done by his primary care physician on 08/03/2021 that showed potential early acute osteomyelitis at the posterior calcaneus. He was seen by Dr. Luciana Axe on 08/06/2021 and started on doxycycline and cefadroxil due to these findings. He  was evaluated by podiatry, Dr. Ardelle Anton and had a bone biopsy done on 08/13/2021 that did not show evidence of osteomyelitis. He had a culture that showed MRSA sensitive to doxycycline. He also had ABIs with TBI's done on 8/2 that showed an ABI of 0.58 and a noncompressible TBI. He is scheduled to see vein and vascular on 8/30. Currently he is keeping the wound covered. He is using a soft surgical shoe. He denies signs of infection. 8/5; patient presents for follow-up. He had follow-up with Dr. Luciana Axe On 8/23. Plan is for 6  weeks of doxycycline through September 16. Although path was negative for osteo there is still concern due to positive culture from the bone. Unfortunately patient missed his vein and vascular appointment to assess blood flow status. This has been rescheduled for 9/8. Patient has been using Medihoney to the wound bed. He tries to offload the wound bed but has trouble at night keeping pressure off of it. He does not have Prevalon boots. He currently denies signs of infection. 9/19; patient presents for follow-up. He is scheduled to have a bilateral lower extremity angiogram on 9/20. He currently denies signs of infection. He has been using Medihoney to the wound bed. 11/13; patient has missed his last follow-up. In fact he has not been here in 2 months. He had bilateral iliofemoral endarterectomy with retrograde iliac stenting on 10/09/2021. He has been using Medihoney with Hydrofera Blue to the heel wound. He reports improvement in wound healing here. 2 weeks ago he developed a wound to the dorsal aspect of the great toe. He states His shoes cause this issue and he is currently wearing different shoes. He is on clindamycin as there was concern for infection to the right great toe. He currently denies signs of infection. He has been keeping the great toe open to air. 12/4; patient presents for follow-up. He has been using Medihoney and Hydrofera Blue to the heel wound and right great toe  wound. The heel wound is closed. The right great toe wound has bone exposed. He now has a new wound to the right ankle that he states he used to shoehorn to scratch the area and created the wound. He has not been dressing this area. 12/21; patient presents for follow-up. He has been using Hydrofera Blue and Medihoney to the right anterior leg wound and right toe wound. There is been improvement in wound healing. He has been taking doxycycline prescribed at last clinic visit. He has no issues or complaints today. 1/5; patient presents for follow-up. He has been using Hydrofera Blue and Medihoney to the right anterior leg wound and right toe wound. Unfortunately has developed a new wound to the left lower extremity. He is not sure how this started. He has completed his course of doxycycline. He denies signs of infection. He is scheduled to see vein and vascular for follow-up on 1/12. 1/18; patient presents for follow-up. He saw vein and vascular on 1/12 for evaluation of a new wound to his left lower extremity. They recommended he continue to elevate his legs. They stated that if the wound became worse or did not improve he will need repeat angiogram. Currently patient denies signs of infection. He has been using Medihoney and Hydrofera Blue to the wound beds. 2/6; patient missed his last clinic appointment. He has been using Medihoney and Hydrofera Blue to the wound beds. He saw vein and vascular on 2/2. No plan for angiography at this time. Patient has no issues or complaints today. 2/20; patient presents for follow-up. He has been using Santyl and Hydrofera Blue to the wound beds however he admits to not dressing the wound beds consistently and leaving them open to air. He denies signs of infection. 3/26; patient presents for follow-up. He had a left lower extremity arteriogram on 3/13. 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 It was recommended that patient would  benefit from redo left common femoral artery exposure, femoral to below-knee popliteal artery bypass. At this time patient is going to think about it.  He has follow-up with vein and vascular next month. He has been using Santyl and Hydrofera Blue to the wound bed. He has developed a new skin tear to his right knee after a fall. 07/02/2022; patient has been following in our clinic for his lower extremity wounds last seen 03/31/2022. Plan was for him to follow-up in 3 weeks after seeing vein and vascular. Unfortunately patient canceled his follow-up and never returned. He presents today as he wants to continue wound care. He has been using Santyl to the left lower extremity wound however states this is irritating his skin and he has stopped this and is using Medihoney. Vein and vascular had HADEN, [name] ([id number]) [id number] Page 7 of 11 ordered home health however it is unclear if they are coming out as patient or daughter cannot tell me. He saw vein and vascular on 06/05/2022 and next recommendation was for a femoropopliteal bypass graft on the left. His wound has declined since I last saw him. He currently denies signs of infection. 7/12; Patient presents for follow-up. He has been using Medihoney and Hydrofera Blue to the wound bed. He has not been using any compression therapy. He has follow-up with vein and vascular in 1 week to discuss the femoropopliteal bypass graft. He currently denies signs of infection. 7/26; patient presents for follow-up. He had a PCR culture done at last clinic visit that grew mainly Pseudomonas aeruginosa. He said antibiotic ointment was ordered for him. He saw vein and vascular on 7/19 who is recommending more aggressive compression therapy. We have attempted to get home health for the patient and this includes 4 agencies but they have not excepted the patient due to insurance. We will try 1 more agency. Currently patient denies signs  of infection. Patient History Information obtained from Patient, Chart. Family History Diabetes - Child, Stroke - Mother, No family history of Cancer, Heart Disease, Hereditary Spherocytosis, Hypertension, Kidney Disease, Lung Disease, Seizures, Thyroid Problems, Tuberculosis. Social History Former smoker, Marital Status - Divorced, Alcohol Use - Moderate, Drug Use - Prior History, Caffeine Use - Daily. Medical History Eyes Patient has history of Cataracts - Surgery Cardiovascular Patient has history of Angina - AFib Endocrine Patient has history of Type II Diabetes Musculoskeletal Patient has history of Gout, Osteoarthritis Denies history of Rheumatoid Arthritis, Osteomyelitis Neurologic Patient has history of Neuropathy Hospitalization/Surgery History - inpatient 09/2021 arterial stenting on legs.. Medical A Surgical History Notes nd Cardiovascular Hx CVA Objective Constitutional respirations regular, non-labored and within target range for patient.. Vitals Time Taken: 10:51 AM, Height: 68 in, Weight: 190 lbs, BMI: 28.9, Temperature: 98.7 F, Pulse: 92 bpm, Respiratory Rate: 18 breaths/min, Blood Pressure: 109/60 mmHg. Cardiovascular 2+ dorsalis pedis/posterior tibialis pulses. Psychiatric pleasant and cooperative. General Notes: Left lower extremity: Large open wound to the anterior aspect with granulation tissue and nonviable tissue. Lateral to this 3 open wound with granulation tissue and scant non viable tissue. No surrounding signs of infection. 2+ pitting edema to the knee. Integumentary (Hair, Skin) Wound #6 status is Open. Original cause of wound was Gradually Appeared. The date acquired was: 05/18/2022. The wound has been in treatment 4 weeks. The wound is located on the Left,Anterior Lower Leg. The wound measures 8cm length x 7cm width x 0.1cm depth; 43.982cm^2 area and 4.398cm^3 volume. There is Fat Layer (Subcutaneous Tissue) exposed. There is no tunneling or  undermining noted. There is a medium amount of serosanguineous drainage noted. There is medium (34-66%) pink granulation within the wound bed. There is  a small (1-33%) amount of necrotic tissue within the wound bed including Adherent Slough. The periwound skin appearance had no abnormalities noted for moisture. The periwound skin appearance had no abnormalities noted for color. The periwound skin appearance exhibited: Scarring. Periwound temperature was noted as No Abnormality. Wound #7 status is Open. Original cause of wound was Gradually Appeared. The date acquired was: 05/18/2022. The wound has been in treatment 4 weeks. The wound is located on the Left,Lateral Ankle. The wound measures 2cm length x 1.3cm width x 0.2cm depth; 2.042cm^2 area and 0.408cm^3 volume. There is Fat Layer (Subcutaneous Tissue) exposed. There is no tunneling or undermining noted. There is a medium amount of serosanguineous drainage noted. There is large (67-100%) red granulation within the wound bed. There is a small (1-33%) amount of necrotic tissue within the wound bed including Adherent Slough. The periwound skin appearance had no abnormalities noted for moisture. The periwound skin appearance had no abnormalities noted for color. The periwound skin appearance exhibited: Scarring. Periwound temperature was noted as No Abnormality. 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 He has  started his Keystone antibiotic ointment for Pseudomonas aeruginosa wound infection. He saw vein and vascular on 7/19 who recommended unna boot. Will start with Kerlix/Coban compression wrap and if he tolerates well will increase to an Radio broadcast assistant. I recommended he follow-up twice weekly to assure he has no issues with the wrap. He knows to not get the wrap wet or keep this on for more than 7 days. For now there is no plan for a femoropopliteal bypass graft. We will attempt to get home health again however we have tried 4 different agencies and no luck. Follow-up early next week for wrap change. For now we will use Hydrofera Blue and antibiotic ointment under the wrap. I suspect the Hydrofera Blue was not being used and that is why the wound beds were dry previously. This will be more beneficial if he has extra drainage. He knows to bring the [facility] antibiotic ointment to next clinic visit. 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 There was a Selective/Open Wound Skin/Epidermis Debridement with a total area of 8.79 sq cm performed by Geralyn Corwin, DO. With the following instrument(s): Curette to remove Viable and Non-Viable tissue/material. Material removed includes Slough, Skin: Dermis, and Skin: Epidermis after achieving pain control using Lidocaine 4% Topical Solution. A time out was conducted at 11:20, prior to the start of the procedure. A Minimum amount of bleeding was controlled with Pressure. The procedure was tolerated well with a pain level of 0 throughout and a pain level of 0 following the procedure. Post Debridement Measurements: 8cm length x 7cm width x 0.1cm depth; 4.398cm^3 volume. Character of Wound/Ulcer Post Debridement is improved. Severity of Tissue Post Debridement is: Fat layer exposed. Post procedure Diagnosis Wound #6: Same as  Pre-Procedure Plan Follow-up Appointments: Return Appointment in 1 week. - Dr. Mikey Bussing room 7  Thursday 0845 08/06/2022 Return Appointment in 2 weeks. - Dr. Mikey Bussing room 9 Thursday 245pm 08/13/2022 Nurse Visit: - Tuesday 330pm 08/04/2022 room 7 Other: - Will try [facility] ****Bring in keystone topical compounding antbiotics to each appt.***** Anesthetic: Wound #6 Left,Anterior Lower Leg: (In clinic) Topical Lidocaine 5% applied to wound bed Wound #7 Left,Lateral Ankle: (In clinic) Topical Lidocaine 5% applied to wound bed Bathing/ Shower/ Hygiene: May shower and wash wound with soap and water. - May change dressing after shower/bathj Edema Control - Lymphedema / SCD / Other: Elevate legs to the level of the heart or above for 30 minutes daily and/or when sitting for 3-4 times a day throughout the day. - as tolerated Avoid standing for long periods of time. Exercise regularly - as tolerated Moisturize legs daily. Additional Orders / Instructions: Follow Nutritious Diet - increase protein. Juven Shake 1-2 times daily. Non Wound Condition: Other Non Wound Condition Orders/Instructions: - will apply gentamicin and mupirocin in clinic. Home Health: Admit to Home Health for skilled nursing wound care. May utilize formulary equivalent dressing for wound treatment orders unless otherwise specified. Dressing changes to be completed by Home Health on Monday / Wednesday / Friday except when patient has scheduled visit at [facility]. - Please do wound care 3 x week (when pt. comes to [facility], this will count as 1 x wound care change) WOUND #6: - Lower Leg Wound Laterality: Left, Anterior Cleanser: Soap and Water 3 x Per Week/30 Days Discharge Instructions: May shower and wash wound with dial antibacterial soap and water prior to dressing change. Cleanser: Vashe 5.8 (oz) 3 x Per Week/30 Days Discharge Instructions: Cleanse the wound with Vashe prior to applying a clean dressing using  gauze sponges, not tissue or cotton balls. Cleanser: Wound Cleanser 3 x Per Week/30 Days Discharge Instructions: Cleanse the wound with wound cleanser prior to applying a clean dressing using gauze sponges, not tissue or cotton balls. Peri-Wound Care: Sween Lotion (Moisturizing lotion) 3 x Per Week/30 Days Discharge Instructions: Apply moisturizing lotion as directed Topical: topical antibiotics [facility] pharmacy) 3 x Per Week/30 Days Discharge Instructions: apply directly to wound bed. Prim Dressing: Maxorb Extra CMC/Alginate Dressing, 4x4 (in/in) 3 x Per Week/30 Days ary Discharge Instructions: Apply to wound bed as instructed Secondary Dressing: ABD Pad, 5x9 (Generic) 3 x Per Week/30 Days Discharge Instructions: Apply over primary dressing as directed. Secondary Dressing: Woven Gauze Sponge, Non-Sterile 4x4 in (Generic) 3 x Per Week/30 Days Discharge Instructions: Apply over primary dressing as directed. Com pression Wrap: Kerlix Roll 4.5x3.1 (in/yd) 3 x Per Week/30 Days Discharge Instructions: Apply Kerlix and Coban compression as directed. Com pression Wrap: Coban Self-Adherent Wrap 4x5 (in/yd) 3 x Per Week/30 Days Discharge Instructions: Apply over Kerlix as directed. CADARIUS, RYALS ([id number]) [id number] Page 9 of 11 WOUND #7: - Ankle Wound Laterality: Left, Lateral Cleanser: Soap and Water 3 x Per Week/30 Days Discharge Instructions: May shower and wash wound with dial antibacterial soap and water prior to dressing change. Cleanser: Vashe 5.8 (oz) 3 x Per Week/30 Days Discharge Instructions: Cleanse the wound with Vashe prior to applying a clean dressing using gauze sponges, not tissue or cotton balls. Cleanser: Wound Cleanser 3 x Per Week/30 Days Discharge Instructions: Cleanse the wound with wound cleanser prior to applying a clean dressing using gauze sponges, not tissue or cotton balls. Peri-Wound Care: Sween Lotion (Moisturizing lotion) 3 x Per  Week/30 Days Discharge Instructions: Apply moisturizing lotion as directed Topical: topical antibiotics [facility] pharmacy)  3 x Per Week/30 Days Discharge Instructions: apply directly to wound bed. Prim Dressing: Maxorb Extra CMC/Alginate Dressing, 4x4 (in/in) 3 x Per Week/30 Days ary Discharge Instructions: Apply to wound bed as instructed Secondary Dressing: ABD Pad, 5x9 (Generic) 3 x Per Week/30 Days Discharge Instructions: Apply over primary dressing as directed. Secondary Dressing: Woven Gauze Sponge, Non-Sterile 4x4 in (Generic) 3 x Per Week/30 Days Discharge Instructions: Apply over primary dressing as directed. Com pression Wrap: Kerlix Roll 4.5x3.1 (in/yd) 3 x Per Week/30 Days Discharge Instructions: Apply Kerlix and Coban compression as directed. Com pression Wrap: Coban Self-Adherent Wrap 4x5 (in/yd) 3 x Per Week/30 Days Discharge Instructions: Apply over Kerlix as directed. 1. Hydrofera Blue with antibiotic ointment under Kerlix/Cobanleft lower extremity 2. Follow-up early next week for wrap change and again later in the week for physician visit. Electronic Signature(s) Signed: 07/31/2022 12:44:10 PM By: Geralyn Corwin DO Entered By: Geralyn Corwin on 07/31/2022 12:03:09 -------------------------------------------------------------------------------- HxROS Details Patient Name: Date of Service: [name] Kenneth L. 07/31/2022 10:30 A M Medical Record Number: [id number] Patient Account Number: [id number] Date of Birth/Sex: Treating RN: [birth year] (87 y.o. M) Primary Care Provider: Docia Chuck, Dibas Other Clinician: Referring Provider: Treating Provider/Extender: Eda Keys, Dibas Weeks in Treatment: 4 Information Obtained From Patient Chart Eyes Medical History: Positive for: Cataracts - Surgery Cardiovascular Medical History: Positive for: Angina - AFib Past Medical History Notes: Hx CVA Endocrine Medical History: Positive for: Type II Diabetes Treated  with: Oral agents, Diet Blood sugar tested every day: No Musculoskeletal Medical History: Positive for: Gout; Osteoarthritis Negative for: Rheumatoid Arthritis; Osteomyelitis Neurologic SHAMIER, DOMBECK ([id number]) [id number] Page 10 of 11 Medical History: Positive for: Neuropathy HBO Extended History Items Eyes: Cataracts Immunizations Pneumococcal Vaccine: Received Pneumococcal Vaccination: No Implantable Devices None Hospitalization / Surgery History Type of Hospitalization/Surgery inpatient 09/2021 arterial stenting on legs. Family and Social History Cancer: No; Diabetes: Yes - Child; Heart Disease: No; Hereditary Spherocytosis: No; Hypertension: No; Kidney Disease: No; Lung Disease: No; Seizures: No; Stroke: Yes - Mother; Thyroid Problems: No; Tuberculosis: No; Former smoker; Marital Status - Divorced; Alcohol Use: Moderate; Drug Use: Prior History; Caffeine Use: Daily; Financial Concerns: No; Food, Clothing or Shelter Needs: No; Support System Lacking: No; Transportation Concerns: No Electronic Signature(s) Signed: 07/31/2022 12:44:10 PM By: Geralyn Corwin DO Entered By: Geralyn Corwin on 07/31/2022 11:56:56 -------------------------------------------------------------------------------- SuperBill Details Patient Name: Date of Service: [name] Kenneth L. 07/31/2022 Medical Record Number: [id number] Patient Account Number: [id number] Date of Birth/Sex: Treating RN: October 22, 1935 (87 y.o. [name] Primary Care Provider: Docia Chuck, Dibas Other Clinician: Referring Provider: Treating Provider/Extender: Eda Keys, Dibas Weeks in Treatment: 4 Diagnosis Coding ICD-10 Codes Code Description [phone] Non-pressure chronic ulcer of other part of left lower leg with fat layer exposed L97.522 Non-pressure chronic ulcer of other part of left foot with fat layer exposed I87.312 Chronic venous hypertension (idiopathic) with ulcer of left  lower extremity I70.245 Atherosclerosis of native arteries of left leg with ulceration of other part of foot E11.621 Type 2 diabetes mellitus with foot ulcer E11.622 Type 2 diabetes mellitus with other skin ulcer Facility Procedures : CPT4 Code: [id number] Description: 97597 - DEBRIDE WOUND 1ST 20 SQ CM OR < ICD-10 Diagnosis Description L97.822 Non-pressure chronic ulcer of other part of left lower leg with fat layer expose E11.622 Type 2 diabetes mellitus with other skin ulcer Modifier: d Quantity: 1 Physician Procedures : CPT4 Code Description Modifier [id number] - WC PHYS LEVEL 3 - EST PT 25 ICD-10 Diagnosis Description L97.822 Non-pressure  chronic ulcer of other part of left lower leg with fat layer exposed L97.522 Non-pressure chronic ulcer of other part of left  foot with fat layer exposed HYDE, PERCLE ([id number]) [id number]. I70.245 Atherosclerosis of native arteries of left leg with ulceration of other part of foot I87.312 Chronic venous hypertension (idiopathic) with ulcer of left  lower extremity Quantity: 1 pdf Page 11 of 11 : [id number] - WC PHYS DEBR WO ANESTH 20 SQ CM 1 ICD-10 Diagnosis Description L97.822 Non-pressure chronic ulcer of other part of left lower leg with fat layer exposed E11.622 Type 2 diabetes mellitus with other skin ulcer Quantity: Electronic Signature(s) Signed: 07/31/2022 12:44:10 PM By: Geralyn Corwin DO Entered By: Geralyn Corwin on 07/31/2022 12:03:52

## 2022-08-02 ENCOUNTER — Other Ambulatory Visit: Payer: Self-pay | Admitting: Interventional Cardiology

## 2022-08-02 ENCOUNTER — Other Ambulatory Visit: Payer: Self-pay | Admitting: Internal Medicine

## 2022-08-02 DIAGNOSIS — M00251 Other streptococcal arthritis, right hip: Secondary | ICD-10-CM

## 2022-08-04 ENCOUNTER — Encounter (HOSPITAL_BASED_OUTPATIENT_CLINIC_OR_DEPARTMENT_OTHER): Payer: Medicare Other | Admitting: Internal Medicine

## 2022-08-04 DIAGNOSIS — I4891 Unspecified atrial fibrillation: Secondary | ICD-10-CM | POA: Diagnosis not present

## 2022-08-04 DIAGNOSIS — E11621 Type 2 diabetes mellitus with foot ulcer: Secondary | ICD-10-CM | POA: Diagnosis not present

## 2022-08-04 DIAGNOSIS — I70245 Atherosclerosis of native arteries of left leg with ulceration of other part of foot: Secondary | ICD-10-CM | POA: Diagnosis not present

## 2022-08-04 DIAGNOSIS — E11622 Type 2 diabetes mellitus with other skin ulcer: Secondary | ICD-10-CM | POA: Diagnosis not present

## 2022-08-04 DIAGNOSIS — E1151 Type 2 diabetes mellitus with diabetic peripheral angiopathy without gangrene: Secondary | ICD-10-CM | POA: Diagnosis not present

## 2022-08-04 DIAGNOSIS — Z7984 Long term (current) use of oral hypoglycemic drugs: Secondary | ICD-10-CM | POA: Diagnosis not present

## 2022-08-04 DIAGNOSIS — L97822 Non-pressure chronic ulcer of other part of left lower leg with fat layer exposed: Secondary | ICD-10-CM | POA: Diagnosis not present

## 2022-08-04 DIAGNOSIS — L97522 Non-pressure chronic ulcer of other part of left foot with fat layer exposed: Secondary | ICD-10-CM | POA: Diagnosis not present

## 2022-08-04 DIAGNOSIS — I87312 Chronic venous hypertension (idiopathic) with ulcer of left lower extremity: Secondary | ICD-10-CM | POA: Diagnosis not present

## 2022-08-04 DIAGNOSIS — Z87891 Personal history of nicotine dependence: Secondary | ICD-10-CM | POA: Diagnosis not present

## 2022-08-04 NOTE — Progress Notes (Signed)
EULON, LANMAN ([id number]) [id number] Page 1 of 6 Visit Report for 08/04/2022 Arrival Information Details Patient Name: Date of Service: [name], [name]. 08/04/2022 3:30 PM Medical Record Number: [id number] Patient Account Number: [id number] Date of Birth/Sex: Treating RN: 1935-08-11 (87 y.o. M) Primary Care Ketura Sirek: Docia Chuck, Dibas Other Clinician: Referring Talitha Dicarlo: Treating Kateryn Marasigan/Extender: Eda Keys, Dibas Weeks in Treatment: 4 Visit Information History Since Last Visit Added or deleted any medications: No Patient Arrived: [name] Any new allergies or adverse reactions: No Arrival Time: 16:14 Had a fall or experienced change in No Accompanied By: son activities of daily living that may affect Transfer Assistance: None risk of falls: Patient Identification Verified: Yes Signs or symptoms of abuse/neglect since last visito No Secondary Verification Process Completed: Yes Hospitalized since last visit: No Patient Has Alerts: Yes Implantable device outside of the clinic excluding No Patient Alerts: ABI R 0.89 (06/05/22) cellular tissue based products placed in the center ABI Hood 0.52 (06/05/22) since last visit: Has Dressing in Place as Prescribed: No Has Compression in Place as Prescribed: No Pain Present Now: No Electronic Signature(s) Signed: 08/04/2022 4:56:42 PM By: Thayer Dallas Entered By: Thayer Dallas on 08/04/2022 16:53:13 -------------------------------------------------------------------------------- Clinic Level of Care Assessment Details Patient Name: Date of Service: [name], [name]. 08/04/2022 3:30 PM Medical Record Number: [id number] Patient Account Number: [id number] Date of Birth/Sex: Treating RN: 1935-02-14 (87 y.o. M) Primary Care Tagen Milby: Docia Chuck, Dibas Other Clinician: Thayer Dallas Referring Sakira Dahmer: Treating Donzell Coller/Extender: Eda Keys, Dibas Weeks in Treatment: 4 Clinic Level of Care  Assessment Items TOOL 4 Quantity Score X- 1 0 Use when only an EandM is performed on FOLLOW-UP visit ASSESSMENTS - Nursing Assessment / Reassessment X- 1 10 Reassessment of Co-morbidities (includes updates in patient status) X- 1 5 Reassessment of Adherence to Treatment Plan ASSESSMENTS - Wound and Skin A ssessment / Reassessment []  - 0 Simple Wound Assessment / Reassessment - one wound []  - 0 Complex Wound Assessment / Reassessment - multiple wounds []  - 0 Dermatologic / Skin Assessment (not related to wound area) ASSESSMENTS - Focused Assessment []  - 0 Circumferential Edema Measurements - multi extremities []  - 0 Nutritional Assessment / Counseling / Intervention [name], [name] ([id number]) [id number] Page 2 of 6 []  - 0 Lower Extremity Assessment (monofilament, tuning fork, pulses) []  - 0 Peripheral Arterial Disease Assessment (using hand held doppler) ASSESSMENTS - Ostomy and/or Continence Assessment and Care []  - 0 Incontinence Assessment and Management []  - 0 Ostomy Care Assessment and Management (repouching, etc.) PROCESS - Coordination of Care X - Simple Patient / Family Education for ongoing care 1 15 []  - 0 Complex (extensive) Patient / Family Education for ongoing care []  - 0 Staff obtains Chiropractor, Records, T Results / Process Orders est []  - 0 Staff telephones HHA, Nursing Homes / Clarify orders / etc []  - 0 Routine Transfer to another Facility (non-emergent condition) []  - 0 Routine Hospital Admission (non-emergent condition) []  - 0 New Admissions / Manufacturing engineer / Ordering NPWT Apligraf, etc. , []  - 0 Emergency Hospital Admission (emergent condition) X- 1 10 Simple Discharge Coordination []  - 0 Complex (extensive) Discharge Coordination PROCESS - Special Needs []  - 0 Pediatric / Minor Patient Management []  - 0 Isolation Patient Management []  - 0 Hearing / Language / Visual special needs []  - 0 Assessment  of Community assistance (transportation, D/C planning, etc.) []  - 0 Additional assistance / Altered mentation []  - 0 Support Surface(s) Assessment (bed, cushion, seat, etc.) INTERVENTIONS - Wound Cleansing /  Measurement []  - 0 Simple Wound Cleansing - one wound X- 2 5 Complex Wound Cleansing - multiple wounds []  - 0 Wound Imaging (photographs - any number of wounds) []  - 0 Wound Tracing (instead of photographs) []  - 0 Simple Wound Measurement - one wound []  - 0 Complex Wound Measurement - multiple wounds INTERVENTIONS - Wound Dressings []  - 0 Small Wound Dressing one or multiple wounds []  - 0 Medium Wound Dressing one or multiple wounds X- 1 20 Large Wound Dressing one or multiple wounds []  - 0 Application of Medications - topical []  - 0 Application of Medications - injection INTERVENTIONS - Miscellaneous []  - 0 External ear exam []  - 0 Specimen Collection (cultures, biopsies, blood, body fluids, etc.) []  - 0 Specimen(s) / Culture(s) sent or taken to Lab for analysis []  - 0 Patient Transfer (multiple staff / Nurse, adult / Similar devices) []  - 0 Simple Staple / Suture removal (25 or less) []  - 0 Complex Staple / Suture removal (26 or more) []  - 0 Hypo / Hyperglycemic Management (close monitor of Blood Glucose) [name], [name] ([id number]) [id number] Page 3 of 6 []  - 0 Ankle / Brachial Index (ABI) - do not check if billed separately []  - 0 Vital Signs Has the patient been seen at the hospital within the last three years: Yes Total Score: 70 Level Of Care: New/Established - Level 2 Electronic Signature(s) Signed: 08/04/2022 4:56:42 PM By: Thayer Dallas Entered By: Thayer Dallas on 08/04/2022 16:54:41 -------------------------------------------------------------------------------- Encounter Discharge Information Details Patient Name: Date of Service: [name]. 08/04/2022 3:30 PM Medical Record Number: [id number] Patient Account  Number: [id number] Date of Birth/Sex: Treating RN: [birth year] (87 y.o. M) Primary Care Shepherd Finnan: Docia Chuck, Dibas Other Clinician: Thayer Dallas Referring Joyell Emami: Treating Reilly Molchan/Extender: Eda Keys, Dibas Weeks in Treatment: 4 Encounter Discharge Information Items Discharge Condition: Stable Ambulatory Status: [name] Discharge Destination: Home Transportation: Private Auto Accompanied By: son Schedule Follow-up Appointment: Yes Clinical Summary of Care: Electronic Signature(s) Signed: 08/04/2022 4:56:42 PM By: Thayer Dallas Entered By: Thayer Dallas on 08/04/2022 16:55:29 -------------------------------------------------------------------------------- Patient/Caregiver Education Details Patient Name: Date of Service: [name]. 7/30/2024andnbsp3:30 PM Medical Record Number: [id number] Patient Account Number: [id number] Date of Birth/Gender: Treating RN: Jun 23, 1935 (87 y.o. M) Primary Care Physician: Docia Chuck, Dibas Other Clinician: Thayer Dallas Referring Physician: Treating Physician/Extender: Eda Keys, Dibas Weeks in Treatment: 4 Education Assessment Education Provided To: Patient Education Topics Provided Electronic Signature(s) Signed: 08/04/2022 4:56:42 PM By: Thayer Dallas Entered By: Thayer Dallas on 08/04/2022 16:55:08 Mechele Collin ([id number]) [id number] Page 4 of 6 -------------------------------------------------------------------------------- Wound Assessment Details Patient Name: Date of Service: [name], [name]. 08/04/2022 3:30 PM Medical Record Number: [id number] Patient Account Number: [id number] Date of Birth/Sex: Treating RN: November 27, 1935 (87 y.o. M) Primary Care Raheen Capili: Docia Chuck, Dibas Other Clinician: Referring Cearra Portnoy: Treating Hisayo Delossantos/Extender: Eda Keys, Dibas Weeks in Treatment: 4 Wound Status Wound Number: 6 Primary Etiology: Arterial Insufficiency Ulcer Wound  Location: Left, Anterior Lower Leg Wound Status: Open Wounding Event: Gradually Appeared Date Acquired: 05/18/2022 Weeks Of Treatment: 4 Clustered Wound: No Wound Measurements Length: (cm) 8 Width: (cm) 7 Depth: (cm) 0.1 Area: (cm) 43.982 Volume: (cm) 4.398 % Reduction in Area: -1.4% % Reduction in Volume: -1.4% Wound Description Classification: Full Thickness Without Exposed Suppor Exudate Amount: Medium Exudate Type: Serosanguineous Exudate Color: red, brown t Structures Periwound Skin Texture Texture Color No Abnormalities Noted: No No Abnormalities Noted: No Moisture No Abnormalities Noted: No Treatment Notes Wound #6 (Lower Leg) Wound Laterality:  Left, Anterior Cleanser Soap and Water Discharge Instruction: May shower and wash wound with dial antibacterial soap and water prior to dressing change. Vashe 5.8 (oz) Discharge Instruction: Cleanse the wound with Vashe prior to applying a clean dressing using gauze sponges, not tissue or cotton balls. Wound Cleanser Discharge Instruction: Cleanse the wound with wound cleanser prior to applying a clean dressing using gauze sponges, not tissue or cotton balls. Peri-Wound Care Sween Lotion (Moisturizing lotion) Discharge Instruction: Apply moisturizing lotion as directed Topical topical antibiotics [facility] pharmacy) Discharge Instruction: apply directly to wound bed. Primary Dressing Maxorb Extra CMC/Alginate Dressing, 4x4 (in/in) Discharge Instruction: Apply to wound bed as instructed Secondary Dressing ABD Pad, 5x9 Discharge Instruction: Apply over primary dressing as directed. Woven Gauze Sponge, Non-Sterile 4x4 in Discharge Instruction: Apply over primary dressing as directed. [name], [name] ([id number]) [id number] Page 5 of 6 Secured With Compression Wrap Kerlix Roll 4.5x3.1 (in/yd) Discharge Instruction: Apply Kerlix and Coban compression as directed. Coban Self-Adherent Wrap 4x5  (in/yd) Discharge Instruction: Apply over Kerlix as directed. Compression Stockings Add-Ons Electronic Signature(s) Signed: 08/04/2022 4:56:42 PM By: Thayer Dallas Entered By: Thayer Dallas on 08/04/2022 16:53:36 -------------------------------------------------------------------------------- Wound Assessment Details Patient Name: Date of Service: [name]. 08/04/2022 3:30 PM Medical Record Number: [id number] Patient Account Number: [id number] Date of Birth/Sex: Treating RN: December 14, 1935 (87 y.o. M) Primary Care Anely Spiewak: Docia Chuck, Dibas Other Clinician: Referring Kimberla Driskill: Treating Philopateer Strine/Extender: Eda Keys, Dibas Weeks in Treatment: 4 Wound Status Wound Number: 7 Primary Etiology: Arterial Insufficiency Ulcer Wound Location: Left, Lateral Ankle Wound Status: Open Wounding Event: Gradually Appeared Date Acquired: 05/18/2022 Weeks Of Treatment: 4 Clustered Wound: No Wound Measurements Length: (cm) 2 Width: (cm) 1.3 Depth: (cm) 0.2 Area: (cm) 2.042 Volume: (cm) 0.408 % Reduction in Area: -30% % Reduction in Volume: -29.9% Wound Description Classification: Full Thickness Without Exposed Suppor Exudate Amount: Medium Exudate Type: Serosanguineous Exudate Color: red, brown t Structures Periwound Skin Texture Texture Color No Abnormalities Noted: No No Abnormalities Noted: No Moisture No Abnormalities Noted: No Treatment Notes Wound #7 (Ankle) Wound Laterality: Left, Lateral Cleanser Soap and Water Discharge Instruction: May shower and wash wound with dial antibacterial soap and water prior to dressing change. Vashe 5.8 (oz) Discharge Instruction: Cleanse the wound with Vashe prior to applying a clean dressing using gauze sponges, not tissue or cotton balls. Wound Cleanser Discharge Instruction: Cleanse the wound with wound cleanser prior to applying a clean dressing using gauze sponges, not tissue or cotton balls. [name], [name] ([id number])  [id number] Page 6 of 6 Peri-Wound Care Sween Lotion (Moisturizing lotion) Discharge Instruction: Apply moisturizing lotion as directed Topical topical antibiotics [facility] pharmacy) Discharge Instruction: apply directly to wound bed. Primary Dressing Maxorb Extra CMC/Alginate Dressing, 4x4 (in/in) Discharge Instruction: Apply to wound bed as instructed Secondary Dressing ABD Pad, 5x9 Discharge Instruction: Apply over primary dressing as directed. Woven Gauze Sponge, Non-Sterile 4x4 in Discharge Instruction: Apply over primary dressing as directed. Secured With Compression Wrap Kerlix Roll 4.5x3.1 (in/yd) Discharge Instruction: Apply Kerlix and Coban compression as directed. Coban Self-Adherent Wrap 4x5 (in/yd) Discharge Instruction: Apply over Kerlix as directed. Compression Stockings Add-Ons Electronic Signature(s) Signed: 08/04/2022 4:56:42 PM By: Thayer Dallas Entered By: Thayer Dallas on 08/04/2022 16:53:37 -------------------------------------------------------------------------------- Vitals Details Patient Name: Date of Service: [name]. 08/04/2022 3:30 PM Medical Record Number: [id number] Patient Account Number: [id number] Date of Birth/Sex: Treating RN: December 27, 1935 (87 y.o. M) Primary Care Ashawnti Tangen: Docia Chuck, Dibas Other Clinician: Referring Hadli Vandemark: Treating Renalda Locklin/Extender: Eda Keys, Dibas Weeks in Treatment:  4 Vital Signs Time Taken: 16:20 Reference Range: 80 - 120 mg / dl Height (in): 68 Weight (lbs): 190 Body Mass Index (BMI): 28.9 Electronic Signature(s) Signed: 08/04/2022 4:56:42 PM By: Thayer Dallas Entered By: Thayer Dallas on 08/04/2022 16:53:21

## 2022-08-06 ENCOUNTER — Encounter (HOSPITAL_BASED_OUTPATIENT_CLINIC_OR_DEPARTMENT_OTHER): Payer: Medicare Other | Attending: Internal Medicine | Admitting: Internal Medicine

## 2022-08-06 DIAGNOSIS — E11621 Type 2 diabetes mellitus with foot ulcer: Secondary | ICD-10-CM | POA: Diagnosis not present

## 2022-08-06 DIAGNOSIS — L97822 Non-pressure chronic ulcer of other part of left lower leg with fat layer exposed: Secondary | ICD-10-CM | POA: Diagnosis not present

## 2022-08-06 DIAGNOSIS — L97522 Non-pressure chronic ulcer of other part of left foot with fat layer exposed: Secondary | ICD-10-CM | POA: Insufficient documentation

## 2022-08-06 DIAGNOSIS — E11622 Type 2 diabetes mellitus with other skin ulcer: Secondary | ICD-10-CM | POA: Insufficient documentation

## 2022-08-06 DIAGNOSIS — I87312 Chronic venous hypertension (idiopathic) with ulcer of left lower extremity: Secondary | ICD-10-CM | POA: Insufficient documentation

## 2022-08-06 DIAGNOSIS — E1142 Type 2 diabetes mellitus with diabetic polyneuropathy: Secondary | ICD-10-CM | POA: Insufficient documentation

## 2022-08-06 DIAGNOSIS — I70245 Atherosclerosis of native arteries of left leg with ulceration of other part of foot: Secondary | ICD-10-CM | POA: Insufficient documentation

## 2022-08-06 NOTE — Progress Notes (Signed)
[name], [name] ([id number]) [id number] Page 1 of 1 Visit Report for 08/04/2022 SuperBill Details Patient Name: Date of Service: [name], [name]. 08/04/2022 Medical Record Number: [id number] Patient Account Number: [id number] Date of Birth/Sex: Treating RN: 08-25-1935 (87 y.o. 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

## 2022-08-07 NOTE — Progress Notes (Signed)
[name], [name] ([id number]) [id number] Page 1 of 13 Visit Report for 08/06/2022 Chief Complaint Document Details Patient Name: Date of Service: [name], Kenneth MES L. 08/06/2022 8:45 A M Medical Record Number: [id number] Patient Account Number: [id number] Date of Birth/Sex: Treating RN: 09-25-1935 (87 y.o. M) Primary Care Provider: Docia Chuck, Hood Other Clinician: Referring Provider: Treating Provider/Extender: [name]: 5 Information Obtained from: Patient Chief Complaint 07/02/2022; Left lower extremity wounds Electronic Signature(s) Signed: 08/06/2022 4:31:19 PM By: [name] Entered By: Kenneth Corwin on 08/06/2022 09:52:30 -------------------------------------------------------------------------------- Debridement Details Patient Name: Date of Service: Kenneth Ina MES L. 08/06/2022 8:45 A M Medical Record Number: [id number] Patient Account Number: [id number] Date of Birth/Sex: Treating RN: 1935-06-21 (87 y.o. [name] Primary Care Provider: Docia Chuck, Hood Other Clinician: Referring Provider: Treating Provider/Extender: [name]: 5 Debridement Performed for Assessment: Wound #6 Left,Medial Lower Leg Performed By: Physician Kenneth Corwin, Hood Debridement Type: Debridement Severity of Tissue Pre Debridement: Fat layer exposed Level of Consciousness (Pre-procedure): Awake and Alert Pre-procedure Verification/Time Out Yes - 09:40 Taken: Start Time: 09:41 Pain Control: Lidocaine 4% T opical Solution Percent of Wound Bed Debrided: 100% T Area Debrided (cm): otal 22.37 Tissue and other material debrided: Viable, Non-Viable, Slough, Subcutaneous, Skin: Dermis , Skin: Epidermis, Biofilm, Slough Level: Skin/Subcutaneous Tissue Debridement Description: Excisional Instrument: Curette Bleeding: Minimum End Time: 09:51 Procedural Pain: 0 Post Procedural Pain:  0 Response to Hood: Procedure was tolerated well Level of Consciousness (Post- Awake and Alert procedure): Post Debridement Measurements of Total Wound Length: (cm) 7.5 Width: (cm) 3.8 Depth: (cm) 0.2 Volume: (cm) 4.477 Character of Wound/Ulcer Post Debridement: Improved Severity of Tissue Post Debridement: Fat layer exposed [name], [name] ([id number]) [id number] Page 2 of 13 Post Procedure Diagnosis Same as Pre-procedure Electronic Signature(s) Signed: 08/06/2022 4:31:19 PM By: [name] Signed: 08/06/2022 5:47:40 PM By: [name] Entered By: [name] on 08/06/2022 09:52:16 -------------------------------------------------------------------------------- Debridement Details Patient Name: Date of Service: Kenneth Ina MES L. 08/06/2022 8:45 A M Medical Record Number: [id number] Patient Account Number: [id number] Date of Birth/Sex: Treating RN: 04-Sep-1935 (87 y.o. [name] Primary Care Provider: Docia Chuck, Hood Other Clinician: Referring Provider: Treating Provider/Extender: [name]: 5 Debridement Performed for Assessment: Wound #9 Left,Anterior Lower Leg Performed By: Physician Kenneth Corwin, Hood Debridement Type: Debridement Severity of Tissue Pre Debridement: Fat layer exposed Level of Consciousness (Pre-procedure): Awake and Alert Pre-procedure Verification/Time Out Yes - 09:40 Taken: Start Time: 09:41 Pain Control: Lidocaine 4% T opical Solution Percent of Wound Bed Debrided: 100% T Area Debrided (cm): otal 6.87 Tissue and other material debrided: Viable, Non-Viable, Slough, Skin: Dermis , Skin: Epidermis, Biofilm, Slough Level: Skin/Epidermis Debridement Description: Selective/Open Wound Instrument: Curette Bleeding: Minimum End Time: 09:51 Procedural Pain: 0 Post Procedural Pain: 0 Response to Hood: Procedure was tolerated well Level of Consciousness (Post-  Awake and Alert procedure): Post Debridement Measurements of Total Wound Length: (cm) 3.5 Width: (cm) 2.5 Depth: (cm) 0.2 Volume: (cm) 1.374 Character of Wound/Ulcer Post Debridement: Improved Severity of Tissue Post Debridement: Fat layer exposed Post Procedure Diagnosis Same as Pre-procedure Electronic Signature(s) Signed: 08/06/2022 4:31:19 PM By: [name] Signed: 08/06/2022 5:47:40 PM By: [name] Entered By: [name] on 08/06/2022 09:52:47 HPI Details -------------------------------------------------------------------------------- [name] ([id number]) [id number] Page 3 of 13 Patient Name: Date of Service: [name], Kenneth MES L. 08/06/2022 8:45 A M Medical Record Number: [id number] Patient Account Number: [id number] Date  of Birth/Sex: Treating RN: 12-30-1935 (88 y.o. M) Primary Care Provider: Docia Chuck, Hood Other Clinician: Referring Provider: Treating Provider/Extender: [name]: 5 History of Present Illness HPI Description: Admission 08/26/2021 [name] is an 87 year old male with a past medical history of controlled type 2 diabetes on oral agents with peripheral neuropathy and Atrial fibrillation not on blood thinners that presents to the clinic with a right foot wound that has waxed and waned in healing over the past year. He has peripheral neuropathy and is not sure when the wound started or how it started. He had an MRI done by his primary care physician on 08/03/2021 that showed potential early acute osteomyelitis at the posterior calcaneus. He was seen by [name] on 08/06/2021 and started on doxycycline and cefadroxil due to these findings. He was evaluated by podiatry, [name] and had a bone biopsy done on 08/13/2021 that did not show evidence of osteomyelitis. He had a culture that showed MRSA sensitive to doxycycline. He also had ABIs with TBI's done on 8/2 that showed  an ABI of 0.58 and a noncompressible TBI. He is scheduled to see vein and vascular on 8/30. Currently he is keeping the wound covered. He is using a soft surgical shoe. He denies signs of infection. 8/5; patient presents for follow-up. He had follow-up with [name] On 8/23. Plan is for 6 weeks of doxycycline through September 16. Although path was negative for osteo there is still concern due to positive culture from the bone. Unfortunately patient missed his vein and vascular appointment to assess blood flow status. This has been rescheduled for 9/8. Patient has been using Medihoney to the wound bed. He tries to offload the wound bed but has trouble at night keeping pressure off of it. He does not have Prevalon boots. He currently denies signs of infection. 9/19; patient presents for follow-up. He is scheduled to have a bilateral lower extremity angiogram on 9/20. He currently denies signs of infection. He has been using Medihoney to the wound bed. 11/13; patient has missed his last follow-up. In fact he has not been here in 2 months. He had bilateral iliofemoral endarterectomy with retrograde iliac stenting on 10/09/2021. He has been using Medihoney with Hydrofera Blue to the heel wound. He reports improvement in wound healing here. 2 weeks ago he developed a wound to the dorsal aspect of the great toe. He states His shoes cause this issue and he is currently wearing different shoes. He is on clindamycin as there was concern for infection to the right great toe. He currently denies signs of infection. He has been keeping the great toe open to air. 12/4; patient presents for follow-up. He has been using Medihoney and Hydrofera Blue to the heel wound and right great toe wound. The heel wound is closed. The right great toe wound has bone exposed. He now has a new wound to the right ankle that he states he used to shoehorn to scratch the area and created the wound. He has not been dressing this  area. 12/21; patient presents for follow-up. He has been using Hydrofera Blue and Medihoney to the right anterior leg wound and right toe wound. There is been improvement in wound healing. He has been taking doxycycline prescribed at last clinic visit. He has no issues or complaints today. 1/5; patient presents for follow-up. He has been using Hydrofera Blue and Medihoney to the right anterior leg wound and right toe wound. Unfortunately has developed  a new wound to the left lower extremity. He is not sure how this started. He has completed his course of doxycycline. He denies signs of infection. He is scheduled to see vein and vascular for follow-up on 1/12. 1/18; patient presents for follow-up. He saw vein and vascular on 1/12 for evaluation of a new wound to his left lower extremity. They recommended he continue to elevate his legs. They stated that if the wound became worse or did not improve he will need repeat angiogram. Currently patient denies signs of infection. He has been using Medihoney and Hydrofera Blue to the wound beds. 2/6; patient missed his last clinic appointment. He has been using Medihoney and Hydrofera Blue to the wound beds. He saw vein and vascular on 2/2. No plan for angiography at this time. Patient has no issues or complaints today. 2/20; patient presents for follow-up. He has been using Santyl and Hydrofera Blue to the wound beds however he admits to not dressing the wound beds consistently and leaving them open to air. He denies signs of infection. 3/26; patient presents for follow-up. He had a left lower extremity arteriogram on 3/13. 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 It was recommended that patient would benefit from redo left common femoral artery exposure, femoral to below-knee popliteal artery bypass. At this time patient is going to think about it. He has follow-up with vein and vascular next month. He has been using Santyl  and Hydrofera Blue to the wound bed. He has developed a new skin tear to his right knee after a fall. 07/02/2022; patient has been following in our clinic for his lower extremity wounds last seen 03/31/2022. Plan was for him to follow-up in 3 weeks after seeing vein and vascular. Unfortunately patient canceled his follow-up and never returned. He presents today as he wants to continue wound care. He has been using Santyl to the left lower extremity wound however states this is irritating his skin and he has stopped this and is using Medihoney. Vein and vascular had ordered home health however it is unclear if they are coming out as patient or daughter cannot tell me. He saw vein and vascular on 06/05/2022 and next recommendation was for a femoropopliteal bypass graft on the left. His wound has declined since I last saw him. He currently denies signs of infection. 7/12; Patient presents for follow-up. He has been using Medihoney and Hydrofera Blue to the wound bed. He has not been using any compression therapy. He has follow-up with vein and vascular in 1 week to discuss the femoropopliteal bypass graft. He currently denies signs of infection. 7/26; patient presents for follow-up. He had a PCR culture done at last clinic visit that grew mainly Pseudomonas aeruginosa. He said antibiotic ointment was ordered for him. He saw vein and vascular on 7/19 who is recommending more aggressive compression therapy. We have attempted to get home health for the patient and this includes 4 agencies but they have not excepted the patient due to insurance. We will try 1 more agency. Currently patient denies signs of infection. 8/1; patient presents for follow-up. He has had a nurse visit since her last visit to have the wrap change. Wounds appear smaller. He has had no issues with the wrap. We have been using [facility] antibiotic ointment and calcium alginate under the wrap. He will continue to follow in our clinic twice  weekly 1 for nurse visit and 1 for physician visit. Electronic Signature(s) Signed:  08/06/2022 4:31:19 PM By: [name] Entered By: Kenneth Corwin on 08/06/2022 09:55:43 [name], [name] ([id number]) [id number] Page 4 of 13 -------------------------------------------------------------------------------- Physical Exam Details Patient Name: Date of Service: [name], Kenneth MES L. 08/06/2022 8:45 A M Medical Record Number: [id number] Patient Account Number: [id number] Date of Birth/Sex: Treating RN: 1935-03-21 (87 y.o. M) Primary Care Provider: Docia Chuck, Hood Other Clinician: Referring Provider: Treating Provider/Extender: [name]: 5 Constitutional respirations regular, non-labored and within target range for patient.. Cardiovascular 2+ dorsalis pedis/posterior tibialis pulses. Psychiatric pleasant and cooperative. Notes Left lower extremity: Large open wound to the anterior aspect with granulation tissue and nonviable tissue. Lateral to this 3 open wounds with granulation tissue and non viable tissue. No surrounding signs of infection. Good edema control. Electronic Signature(s) Signed: 08/06/2022 4:31:19 PM By: [name] Entered By: Kenneth Corwin on 08/06/2022 09:58:29 -------------------------------------------------------------------------------- Physician Orders Details Patient Name: Date of Service: Kenneth Ina MES L. 08/06/2022 8:45 A M Medical Record Number: [id number] Patient Account Number: [id number] Date of Birth/Sex: Treating RN: [birth year] (87 y.o. [name] Primary Care Provider: Docia Chuck, Hood Other Clinician: Referring Provider: Treating Provider/Extender: [name]: 5 Verbal / Phone Orders: No Diagnosis Coding ICD-10 Coding Code Description [phone] Non-pressure chronic ulcer of other part of left lower leg with fat layer exposed L97.522  Non-pressure chronic ulcer of other part of left foot with fat layer exposed I87.312 Chronic venous hypertension (idiopathic) with ulcer of left lower extremity I70.245 Atherosclerosis of native arteries of left leg with ulceration of other part of foot E11.621 Type 2 diabetes mellitus with foot ulcer E11.622 Type 2 diabetes mellitus with other skin ulcer Follow-up Appointments ppointment in 1 week. - Dr. Mikey Bussing room 9 Thursday 245pm 08/13/2022 Return A ppointment in 2 weeks. - Dr. Mikey Bussing room 9 Thursday 215pm 08/20/2022 Return A Nurse Visit: - MONDAY 345pm 08/10/2022 room 7 Monday 315pm 08/17/2022 room 7 Other: - Will try Home Health ****Bring in [place] topical compounding antbiotics to each appt.***** Anesthetic Wound #6 Left,Medial Lower Leg (In clinic) Topical Lidocaine 5% applied to wound bed Wound #7 Left,Lateral Ankle [name], [name] ([id number]) [id number] Page 5 of 13 (In clinic) Topical Lidocaine 5% applied to wound bed Bathing/ Shower/ Hygiene May shower and wash wound with soap and water. - May change dressing after shower/bathj Edema Control - Lymphedema / SCD / Other Bilateral Lower Extremities Elevate legs to the level of the heart or above for 30 minutes daily and/or when sitting for 3-4 times a day throughout the day. - as tolerated Avoid standing for long periods of time. Exercise regularly - as tolerated Moisturize legs daily. Additional Orders / Instructions Follow Nutritious Diet - increase protein. Juven Shake 1-2 times daily. Non Wound Condition Other Non Wound Condition Orders/Instructions: - will apply gentamicin and mupirocin in clinic. Home Health A dmit to Home Health for skilled nursing wound care. May utilize formulary equivalent dressing for wound Hood orders unless otherwise specified. No change in wound care orders this week; continue Home Health for wound care. May utilize formulary equivalent dressing for  wound Hood orders unless otherwise specified. - home health once a week and wound center weekly. Wound Hood Wound #6 - Lower Leg Wound Laterality: Left, Medial Cleanser: Soap and Water 2 x Per Week/30 Days Discharge Instructions: May shower and wash wound with dial antibacterial soap and water prior to dressing change. Cleanser: Vashe 5.8 (oz) 2 x Per Week/30 Days Discharge Instructions: Cleanse the wound  with Vashe prior to applying a clean dressing using gauze sponges, not tissue or cotton balls. Cleanser: Wound Cleanser 2 x Per Week/30 Days Discharge Instructions: Cleanse the wound with wound cleanser prior to applying a clean dressing using gauze sponges, not tissue or cotton balls. Peri-Wound Care: Sween Lotion (Moisturizing lotion) 2 x Per Week/30 Days Discharge Instructions: Apply moisturizing lotion as directed Topical: topical antibiotics [facility] pharmacy) 2 x Per Week/30 Days Discharge Instructions: apply directly to wound bed. Prim Dressing: Maxorb Extra CMC/Alginate Dressing, 4x4 (in/in) 2 x Per Week/30 Days ary Discharge Instructions: Apply to wound bed as instructed Secondary Dressing: ABD Pad, 5x9 (Generic) 2 x Per Week/30 Days Discharge Instructions: Apply over primary dressing as directed. Secondary Dressing: Woven Gauze Sponge, Non-Sterile 4x4 in (Generic) 2 x Per Week/30 Days Discharge Instructions: Apply over primary dressing as directed. Secondary Dressing: Zetuvit Plus 4x8 in (Generic) 2 x Per Week/30 Days Discharge Instructions: Apply over primary dressing as directed. Compression Wrap: Kerlix Roll 4.5x3.1 (in/yd) 2 x Per Week/30 Days Discharge Instructions: Apply Kerlix and Coban compression as directed. Compression Wrap: Coban Self-Adherent Wrap 4x5 (in/yd) 2 x Per Week/30 Days Discharge Instructions: Apply over Kerlix as directed. Wound #7 - Ankle Wound Laterality: Left, Lateral Cleanser: Soap and Water 2 x Per Week/30 Days Discharge Instructions:  May shower and wash wound with dial antibacterial soap and water prior to dressing change. Cleanser: Vashe 5.8 (oz) 2 x Per Week/30 Days Discharge Instructions: Cleanse the wound with Vashe prior to applying a clean dressing using gauze sponges, not tissue or cotton balls. Cleanser: Wound Cleanser 2 x Per Week/30 Days Discharge Instructions: Cleanse the wound with wound cleanser prior to applying a clean dressing using gauze sponges, not tissue or cotton balls. Peri-Wound Care: Sween Lotion (Moisturizing lotion) 2 x Per Week/30 Days Discharge Instructions: Apply moisturizing lotion as directed Topical: topical antibiotics [facility] pharmacy) 2 x Per Week/30 Days Discharge Instructions: apply directly to wound bed. Prim Dressing: Maxorb Extra CMC/Alginate Dressing, 4x4 (in/in) 2 x Per Week/30 Days ary Discharge Instructions: Apply to wound bed as instructed [name], [name] ([id number]) [id number] Page 6 of 13 Secondary Dressing: ABD Pad, 5x9 (Generic) 2 x Per Week/30 Days Discharge Instructions: Apply over primary dressing as directed. Secondary Dressing: Woven Gauze Sponge, Non-Sterile 4x4 in (Generic) 2 x Per Week/30 Days Discharge Instructions: Apply over primary dressing as directed. Secondary Dressing: Zetuvit Plus 4x8 in (Generic) 2 x Per Week/30 Days Discharge Instructions: Apply over primary dressing as directed. Compression Wrap: Kerlix Roll 4.5x3.1 (in/yd) 2 x Per Week/30 Days Discharge Instructions: Apply Kerlix and Coban compression as directed. Compression Wrap: Coban Self-Adherent Wrap 4x5 (in/yd) 2 x Per Week/30 Days Discharge Instructions: Apply over Kerlix as directed. Wound #9 - Lower Leg Wound Laterality: Left, Anterior Cleanser: Soap and Water 2 x Per Week/30 Days Discharge Instructions: May shower and wash wound with dial antibacterial soap and water prior to dressing change. Cleanser: Vashe 5.8 (oz) 2 x Per Week/30 Days Discharge  Instructions: Cleanse the wound with Vashe prior to applying a clean dressing using gauze sponges, not tissue or cotton balls. Cleanser: Wound Cleanser 2 x Per Week/30 Days Discharge Instructions: Cleanse the wound with wound cleanser prior to applying a clean dressing using gauze sponges, not tissue or cotton balls. Peri-Wound Care: Sween Lotion (Moisturizing lotion) 2 x Per Week/30 Days Discharge Instructions: Apply moisturizing lotion as directed Topical: topical antibiotics [facility] pharmacy) 2 x Per Week/30 Days Discharge Instructions: apply directly to wound bed. Prim Dressing: Maxorb Extra CMC/Alginate  Dressing, 4x4 (in/in) 2 x Per Week/30 Days ary Discharge Instructions: Apply to wound bed as instructed Secondary Dressing: ABD Pad, 5x9 (Generic) 2 x Per Week/30 Days Discharge Instructions: Apply over primary dressing as directed. Secondary Dressing: Woven Gauze Sponge, Non-Sterile 4x4 in (Generic) 2 x Per Week/30 Days Discharge Instructions: Apply over primary dressing as directed. Secondary Dressing: Zetuvit Plus 4x8 in (Generic) 2 x Per Week/30 Days Discharge Instructions: Apply over primary dressing as directed. Compression Wrap: Kerlix Roll 4.5x3.1 (in/yd) 2 x Per Week/30 Days Discharge Instructions: Apply Kerlix and Coban compression as directed. Compression Wrap: Coban Self-Adherent Wrap 4x5 (in/yd) 2 x Per Week/30 Days Discharge Instructions: Apply over Kerlix as directed. Electronic Signature(s) Signed: 08/06/2022 4:31:19 PM By: [name] Entered By: Kenneth Corwin on 08/06/2022 09:58:39 -------------------------------------------------------------------------------- Problem List Details Patient Name: Date of Service: Kenneth Ina MES L. 08/06/2022 8:45 A M Medical Record Number: [id number] Patient Account Number: [id number] Date of Birth/Sex: Treating RN: July 26, 1935 (87 y.o. [name] Primary Care Provider: Docia Chuck, Hood Other Clinician: Referring  Provider: Treating Provider/Extender: [name]: [address] [place] ([id number]) [id number] Page 7 of 13 ICD-10 Encounter Code Description Active Date MDM Diagnosis L97.822 Non-pressure chronic ulcer of other part of left lower leg with fat layer exposed6/27/2024 No Yes L97.522 Non-pressure chronic ulcer of other part of left foot with fat layer exposed 07/02/2022 No Yes I87.312 Chronic venous hypertension (idiopathic) with ulcer of left lower extremity 07/02/2022 No Yes I70.245 Atherosclerosis of native arteries of left leg with ulceration of other part of 07/02/2022 No Yes foot E11.621 Type 2 diabetes mellitus with foot ulcer 07/02/2022 No Yes E11.622 Type 2 diabetes mellitus with other skin ulcer 07/02/2022 No Yes Inactive Problems Resolved Problems Electronic Signature(s) Signed: 08/06/2022 4:31:19 PM By: [name] Entered By: Kenneth Corwin on 08/06/2022 09:52:17 -------------------------------------------------------------------------------- Progress Note Details Patient Name: Date of Service: Kenneth Ina MES L. 08/06/2022 8:45 A M Medical Record Number: [id number] Patient Account Number: [id number] Date of Birth/Sex: Treating RN: 1935-02-24 (87 y.o. M) Primary Care Provider: Docia Chuck, Hood Other Clinician: Referring Provider: Treating Provider/Extender: [name]: 5 Subjective Chief Complaint Information obtained from Patient 07/02/2022; Left lower extremity wounds History of Present Illness (HPI) Admission 08/26/2021 Mr. Arran Fessel is an 87 year old male with a past medical history of controlled type 2 diabetes on oral agents with peripheral neuropathy and Atrial fibrillation not on blood thinners that presents to the clinic with a right foot wound that has waxed and waned in healing over the past year. He has peripheral neuropathy and is not sure  when the wound started or how it started. He had an MRI done by his primary care physician on 08/03/2021 that showed potential early acute osteomyelitis at the posterior calcaneus. He was seen by [name] on 08/06/2021 and started on doxycycline and cefadroxil due to these findings. He was evaluated by podiatry, [name] and had a bone biopsy done on 08/13/2021 that did not show evidence of osteomyelitis. He had a culture that showed MRSA sensitive to doxycycline. He also had ABIs with TBI's done on 8/2 that showed an ABI of 0.58 and a noncompressible TBI. He is scheduled to see vein and vascular on 8/30. Currently he is keeping the wound covered. He is using a soft surgical shoe. He denies signs of infection. 8/5; patient presents for follow-up. He had follow-up with [name] On 8/23. Plan is for 6 weeks of doxycycline through  September 16. Although path was negative for osteo there is still concern due to positive culture from the bone. Unfortunately patient missed his vein and vascular appointment to assess blood flow status. This has been rescheduled for 9/8. Patient has been using Medihoney to the wound bed. He tries to offload the wound bed but has trouble at night keeping pressure off of it. He does not have Prevalon boots. He currently denies signs of infection. 9/19; patient presents for follow-up. He is scheduled to have a bilateral lower extremity angiogram on 9/20. He currently denies signs of infection. He has been using Medihoney to the wound bed. BALDO, HUFNAGLE ([id number]) [id number] Page 8 of 13 11/13; patient has missed his last follow-up. In fact he has not been here in 2 months. He had bilateral iliofemoral endarterectomy with retrograde iliac stenting on 10/09/2021. He has been using Medihoney with Hydrofera Blue to the heel wound. He reports improvement in wound healing here. 2 weeks ago he developed a wound to the dorsal aspect of the great toe. He  states His shoes cause this issue and he is currently wearing different shoes. He is on clindamycin as there was concern for infection to the right great toe. He currently denies signs of infection. He has been keeping the great toe open to air. 12/4; patient presents for follow-up. He has been using Medihoney and Hydrofera Blue to the heel wound and right great toe wound. The heel wound is closed. The right great toe wound has bone exposed. He now has a new wound to the right ankle that he states he used to shoehorn to scratch the area and created the wound. He has not been dressing this area. 12/21; patient presents for follow-up. He has been using Hydrofera Blue and Medihoney to the right anterior leg wound and right toe wound. There is been improvement in wound healing. He has been taking doxycycline prescribed at last clinic visit. He has no issues or complaints today. 1/5; patient presents for follow-up. He has been using Hydrofera Blue and Medihoney to the right anterior leg wound and right toe wound. Unfortunately has developed a new wound to the left lower extremity. He is not sure how this started. He has completed his course of doxycycline. He denies signs of infection. He is scheduled to see vein and vascular for follow-up on 1/12. 1/18; patient presents for follow-up. He saw vein and vascular on 1/12 for evaluation of a new wound to his left lower extremity. They recommended he continue to elevate his legs. They stated that if the wound became worse or did not improve he will need repeat angiogram. Currently patient denies signs of infection. He has been using Medihoney and Hydrofera Blue to the wound beds. 2/6; patient missed his last clinic appointment. He has been using Medihoney and Hydrofera Blue to the wound beds. He saw vein and vascular on 2/2. No plan for angiography at this time. Patient has no issues or complaints today. 2/20; patient presents for follow-up. He has been using  Santyl and Hydrofera Blue to the wound beds however he admits to not dressing the wound beds consistently and leaving them open to air. He denies signs of infection. 3/26; patient presents for follow-up. He had a left lower extremity arteriogram on 3/13. 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 It was recommended that patient would benefit from redo left common femoral artery exposure, femoral to below-knee popliteal artery bypass. At this time patient is  going to think about it. He has follow-up with vein and vascular next month. He has been using Santyl and Hydrofera Blue to the wound bed. He has developed a new skin tear to his right knee after a fall. 07/02/2022; patient has been following in our clinic for his lower extremity wounds last seen 03/31/2022. Plan was for him to follow-up in 3 weeks after seeing vein and vascular. Unfortunately patient canceled his follow-up and never returned. He presents today as he wants to continue wound care. He has been using Santyl to the left lower extremity wound however states this is irritating his skin and he has stopped this and is using Medihoney. Vein and vascular had ordered home health however it is unclear if they are coming out as patient or daughter cannot tell me. He saw vein and vascular on 06/05/2022 and next recommendation was for a femoropopliteal bypass graft on the left. His wound has declined since I last saw him. He currently denies signs of infection. 7/12; Patient presents for follow-up. He has been using Medihoney and Hydrofera Blue to the wound bed. He has not been using any compression therapy. He has follow-up with vein and vascular in 1 week to discuss the femoropopliteal bypass graft. He currently denies signs of infection. 7/26; patient presents for follow-up. He had a PCR culture done at last clinic visit that grew mainly Pseudomonas aeruginosa. He said antibiotic ointment was ordered for him. He  saw vein and vascular on 7/19 who is recommending more aggressive compression therapy. We have attempted to get home health for the patient and this includes 4 agencies but they have not excepted the patient due to insurance. We will try 1 more agency. Currently patient denies signs of infection. 8/1; patient presents for follow-up. He has had a nurse visit since her last visit to have the wrap change. Wounds appear smaller. He has had no issues with the wrap. We have been using [facility] antibiotic ointment and calcium alginate under the wrap. He will continue to follow in our clinic twice weekly 1 for nurse visit and 1 for physician visit. Patient History Information obtained from Patient, Chart. Family History Diabetes - Child, Stroke - Mother, No family history of Cancer, Heart Disease, Hereditary Spherocytosis, Hypertension, Kidney Disease, Lung Disease, Seizures, Thyroid Problems, Tuberculosis. Social History Former smoker, Marital Status - Divorced, Alcohol Use - Moderate, Drug Use - Prior History, Caffeine Use - Daily. Medical History Eyes Patient has history of Cataracts - Surgery Cardiovascular Patient has history of Angina - AFib Endocrine Patient has history of Type II Diabetes Musculoskeletal Patient has history of Gout, Osteoarthritis Denies history of Rheumatoid Arthritis, Osteomyelitis Neurologic Patient has history of Neuropathy Hospitalization/Surgery History - inpatient 09/2021 arterial stenting on legs.. Medical A Surgical History Notes nd Cardiovascular Hx CVA Objective DEVELLE, SIEVERS ([id number]) [id number] Page 9 of 13 Constitutional respirations regular, non-labored and within target range for patient.. Vitals Time Taken: 9:17 AM, Height: 68 in, Weight: 190 lbs, BMI: 28.9, Temperature: 97.9 F, Pulse: 70 bpm, Respiratory Rate: 18 breaths/min, Blood Pressure: 97/58 mmHg. Cardiovascular 2+ dorsalis pedis/posterior tibialis  pulses. Psychiatric pleasant and cooperative. General Notes: Left lower extremity: Large open wound to the anterior aspect with granulation tissue and nonviable tissue. Lateral to this 3 open wounds with granulation tissue and non viable tissue. No surrounding signs of infection. Good edema control. Integumentary (Hair, Skin) Wound #6 status is Open. Original cause of wound was Gradually Appeared. The date acquired was: 05/18/2022. The wound has been  in [place] 5 weeks. The wound is located on the Left,Medial Lower Leg. The wound measures 7.5cm length x 3.8cm width x 0.1cm depth; 22.384cm^2 area and 2.238cm^3 volume. There is Fat Layer (Subcutaneous Tissue) exposed. There is no tunneling or undermining noted. There is a medium amount of serosanguineous drainage noted. The wound margin is distinct with the outline attached to the wound base. There is large (67-100%) red granulation within the wound bed. There is a small (1-33%) amount of necrotic tissue within the wound bed including Adherent Slough. The periwound skin appearance did not exhibit: Callus, Crepitus, Excoriation, Induration, Rash, Scarring, Dry/Scaly, Maceration, Atrophie Blanche, Cyanosis, Ecchymosis, Hemosiderin Staining, Mottled, Pallor, Rubor, Erythema. General Notes: separated wounds. Wound #7 status is Open. Original cause of wound was Gradually Appeared. The date acquired was: 05/18/2022. The wound has been in [place] 5 weeks. The wound is located on the Left,Lateral Ankle. The wound measures 1.5cm length x 1cm width x 0.1cm depth; 1.178cm^2 area and 0.118cm^3 volume. There is Fat Layer (Subcutaneous Tissue) exposed. There is no tunneling or undermining noted. There is a medium amount of serosanguineous drainage noted. There is large (67-100%) red granulation within the wound bed. There is a small (1-33%) amount of necrotic tissue within the wound bed including Adherent Slough. The periwound skin appearance did not exhibit:  Callus, Crepitus, Excoriation, Induration, Rash, Scarring, Dry/Scaly, Maceration, Atrophie Blanche, Cyanosis, Ecchymosis, Hemosiderin Staining, Mottled, Pallor, Rubor, Erythema. Wound #9 status is Open. Original cause of wound was Gradually Appeared. The date acquired was: 05/18/2022. The wound is located on the Left,Anterior Lower Leg. The wound measures 3.5cm length x 2.5cm width x 0.2cm depth; 6.872cm^2 area and 1.374cm^3 volume. There is Fat Layer (Subcutaneous Tissue) exposed. There is no tunneling or undermining noted. There is a medium amount of serosanguineous drainage noted. The wound margin is distinct with the outline attached to the wound base. There is small (1-33%) red, pink granulation within the wound bed. There is a large (67-100%) amount of necrotic tissue within the wound bed including Adherent Slough. The periwound skin appearance exhibited: Scarring, Hemosiderin Staining. The periwound skin appearance did not exhibit: Callus, Crepitus, Excoriation, Induration, Rash, Dry/Scaly, Maceration, Atrophie Blanche, Cyanosis, Ecchymosis, Mottled, Pallor, Rubor, Erythema. 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 I debrided nonviable tissue. I recommended continuing the course with [facility] antibiotic ointment and calcium alginate under Kerlix/Coban. He will be seen twice weekly for wrap changes. Procedures Wound #6 Pre-procedure diagnosis of Wound #6 is a Diabetic Wound/Ulcer of the Lower Extremity located on the  Left,Medial Lower Leg .Severity of Tissue Pre Debridement is: Fat layer exposed. There was a Excisional Skin/Subcutaneous Tissue Debridement with a total area of 22.37 sq cm performed by Kenneth Corwin, [place]. With the following instrument(s): Curette to remove Viable and Non-Viable tissue/material. Material removed includes Subcutaneous Tissue, Slough, Skin: Dermis, Skin: Epidermis, and Biofilm after achieving pain control using Lidocaine 4% Topical Solution. A time out was conducted at 09:40, prior to the start of the procedure. A Minimum amount of bleeding was controlled with N/A. The procedure was tolerated well  with a pain level of 0 throughout and a pain level of 0 following the procedure. Post Debridement Measurements: 7.5cm length x 3.8cm width x 0.2cm depth; 4.477cm^3 volume. Character of Wound/Ulcer Post Debridement is improved. Severity of Tissue Post Debridement is: Fat layer exposed. Post procedure Diagnosis Wound #6: Same as Pre-Procedure Wound #9 Pre-procedure diagnosis of Wound #9 is a Diabetic Wound/Ulcer of the Lower Extremity located on the Left,Anterior Lower Leg .Severity of Tissue Pre Debridement is: Fat layer exposed. There was a Selective/Open Wound Skin/Epidermis Debridement with a total area of 6.87 sq cm performed by Kenneth Corwin, [place]. With the following instrument(s): Curette to remove Viable and Non-Viable tissue/material. Material removed includes Slough, Skin: Dermis, Skin: Epidermis, and Biofilm after achieving pain control using Lidocaine 4% Topical Solution. A time out was conducted at 09:40, prior to the start of the procedure. A Minimum amount of bleeding was controlled with N/A. The procedure was tolerated well with a pain level of 0 throughout and a pain level of 0 following the procedure. Post Debridement Measurements: 3.5cm length x 2.5cm width x 0.2cm depth; 1.374cm^3 volume. Character of Wound/Ulcer Post Debridement is improved. Severity of Tissue Post  Debridement is: Fat layer exposed. Post procedure Diagnosis Wound #9: Same as Pre-Procedure [name], [name] ([id number]) [id number] Page 10 of 13 Plan Follow-up Appointments: Return Appointment in 1 week. - Dr. Mikey Bussing room 9 Thursday 245pm 08/13/2022 Return Appointment in 2 weeks. - Dr. Mikey Bussing room 9 Thursday 215pm 08/20/2022 Nurse Visit: - MONDAY 345pm 08/10/2022 room 7 Monday 315pm 08/17/2022 room 7 Other: - Will try Home Health ****Bring in [facility]stone topical compounding antbiotics to each appt.***** Anesthetic: Wound #6 Left,Medial Lower Leg: (In clinic) Topical Lidocaine 5% applied to wound bed Wound #7 Left,Lateral Ankle: (In clinic) Topical Lidocaine 5% applied to wound bed Bathing/ Shower/ Hygiene: May shower and wash wound with soap and water. - May change dressing after shower/bathj Edema Control - Lymphedema / SCD / Other: Elevate legs to the level of the heart or above for 30 minutes daily and/or when sitting for 3-4 times a day throughout the day. - as tolerated Avoid standing for long periods of time. Exercise regularly - as tolerated Moisturize legs daily. Additional Orders / Instructions: Follow Nutritious Diet - increase protein. Juven Shake 1-2 times daily. Non Wound Condition: Other Non Wound Condition Orders/Instructions: - will apply gentamicin and mupirocin in clinic. Home Health: Admit to Home Health for skilled nursing wound care. May utilize formulary equivalent dressing for wound [place] orders unless otherwise specified. No change in wound care orders this week; continue Home Health for wound care. May utilize formulary equivalent dressing for wound [place] orders unless otherwise specified. - home health once a week and wound center weekly. WOUND #6: - Lower Leg Wound Laterality: Left, Medial Cleanser: Soap and Water 2 x Per Week/30 Days Discharge Instructions: May shower and wash wound with dial antibacterial soap and water prior  to dressing change. Cleanser: Vashe 5.8 (oz) 2 x Per Week/30 Days Discharge Instructions: Cleanse the wound with Vashe prior to applying a clean dressing using gauze sponges, not tissue or cotton balls. Cleanser: Wound Cleanser 2 x Per Week/30 Days Discharge Instructions: Cleanse the wound with wound cleanser prior to applying a clean dressing using gauze sponges, not tissue or cotton balls. Peri-Wound Care: Sween Lotion (Moisturizing lotion) 2 x Per Week/30 Days Discharge Instructions: Apply moisturizing lotion as directed Topical: topical antibiotics [facility] pharmacy) 2 x Per Week/30 Days Discharge Instructions: apply directly to wound bed. Prim  Dressing: Maxorb Extra CMC/Alginate Dressing, 4x4 (in/in) 2 x Per Week/30 Days ary Discharge Instructions: Apply to wound bed as instructed Secondary Dressing: ABD Pad, 5x9 (Generic) 2 x Per Week/30 Days Discharge Instructions: Apply over primary dressing as directed. Secondary Dressing: Woven Gauze Sponge, Non-Sterile 4x4 in (Generic) 2 x Per Week/30 Days Discharge Instructions: Apply over primary dressing as directed. Secondary Dressing: Zetuvit Plus 4x8 in (Generic) 2 x Per Week/30 Days Discharge Instructions: Apply over primary dressing as directed. Com pression Wrap: Kerlix Roll 4.5x3.1 (in/yd) 2 x Per Week/30 Days Discharge Instructions: Apply Kerlix and Coban compression as directed. Com pression Wrap: Coban Self-Adherent Wrap 4x5 (in/yd) 2 x Per Week/30 Days Discharge Instructions: Apply over Kerlix as directed. WOUND #7: - Ankle Wound Laterality: Left, Lateral Cleanser: Soap and Water 2 x Per Week/30 Days Discharge Instructions: May shower and wash wound with dial antibacterial soap and water prior to dressing change. Cleanser: Vashe 5.8 (oz) 2 x Per Week/30 Days Discharge Instructions: Cleanse the wound with Vashe prior to applying a clean dressing using gauze sponges, not tissue or cotton balls. Cleanser: Wound Cleanser 2 x Per  Week/30 Days Discharge Instructions: Cleanse the wound with wound cleanser prior to applying a clean dressing using gauze sponges, not tissue or cotton balls. Peri-Wound Care: Sween Lotion (Moisturizing lotion) 2 x Per Week/30 Days Discharge Instructions: Apply moisturizing lotion as directed Topical: topical antibiotics [facility] pharmacy) 2 x Per Week/30 Days Discharge Instructions: apply directly to wound bed. Prim Dressing: Maxorb Extra CMC/Alginate Dressing, 4x4 (in/in) 2 x Per Week/30 Days ary Discharge Instructions: Apply to wound bed as instructed Secondary Dressing: ABD Pad, 5x9 (Generic) 2 x Per Week/30 Days Discharge Instructions: Apply over primary dressing as directed. Secondary Dressing: Woven Gauze Sponge, Non-Sterile 4x4 in (Generic) 2 x Per Week/30 Days Discharge Instructions: Apply over primary dressing as directed. Secondary Dressing: Zetuvit Plus 4x8 in (Generic) 2 x Per Week/30 Days Discharge Instructions: Apply over primary dressing as directed. Com pression Wrap: Kerlix Roll 4.5x3.1 (in/yd) 2 x Per Week/30 Days Discharge Instructions: Apply Kerlix and Coban compression as directed. Com pression Wrap: Coban Self-Adherent Wrap 4x5 (in/yd) 2 x Per Week/30 Days Discharge Instructions: Apply over Kerlix as directed. WOUND #9: - Lower Leg Wound Laterality: Left, Anterior Cleanser: Soap and Water 2 x Per Week/30 Days Discharge Instructions: May shower and wash wound with dial antibacterial soap and water prior to dressing change. Cleanser: Vashe 5.8 (oz) 2 x Per Week/30 Days Discharge Instructions: Cleanse the wound with Vashe prior to applying a clean dressing using gauze sponges, not tissue or cotton balls. Cleanser: Wound Cleanser 2 x Per Week/30 Days Discharge Instructions: Cleanse the wound with wound cleanser prior to applying a clean dressing using gauze sponges, not tissue or cotton balls. Peri-Wound Care: Sween Lotion (Moisturizing lotion) 2 x Per Week/30  Days Discharge Instructions: Apply moisturizing lotion as directed Topical: topical antibiotics [facility] pharmacy) 2 x Per Week/30 Days Discharge Instructions: apply directly to wound bed. Prim Dressing: Maxorb Extra CMC/Alginate Dressing, 4x4 (in/in) 2 x Per Week/30 Days ary Discharge Instructions: Apply to wound bed as instructed Secondary Dressing: ABD Pad, 5x9 (Generic) 2 x Per Week/30 Days [name], BARBATO ([id number]) [id number] Page 11 of 13 Discharge Instructions: Apply over primary dressing as directed. Secondary Dressing: Woven Gauze Sponge, Non-Sterile 4x4 in (Generic) 2 x Per Week/30 Days Discharge Instructions: Apply over primary dressing as directed. Secondary Dressing: Zetuvit Plus 4x8 in (Generic) 2 x Per Week/30 Days Discharge Instructions: Apply over primary dressing as directed.  Compression Wrap: Kerlix Roll 4.5x3.1 (in/yd) 2 x Per Week/30 Days Discharge Instructions: Apply Kerlix and Coban compression as directed. Compression Wrap: Coban Self-Adherent Wrap 4x5 (in/yd) 2 x Per Week/30 Days Discharge Instructions: Apply over Kerlix as directed. 1. In office sharp debridement 2. [facility] antibiotic ointment with calcium alginate under Kerlix/Coban to the left lower extremity 3. Reoccurring nurse visit on Mondays and physician visits on Thursdays Electronic Signature(s) Signed: 08/06/2022 4:31:19 PM By: [name] Entered By: Kenneth Corwin on 08/06/2022 10:00:26 -------------------------------------------------------------------------------- HxROS Details Patient Name: Date of Service: Kenneth Ina MES L. 08/06/2022 8:45 A M Medical Record Number: [id number] Patient Account Number: [id number] Date of Birth/Sex: Treating RN: [birth year] (88 y.o. M) Primary Care Provider: Docia Chuck, [place] Other Clinician: Referring Provider: Treating Provider/Extender: [name]: 5 Information Obtained From Patient  Chart Eyes Medical History: Positive for: Cataracts - Surgery Cardiovascular Medical History: Positive for: Angina - AFib Past Medical History Notes: Hx CVA Endocrine Medical History: Positive for: Type II Diabetes Treated with: Oral agents, Diet Blood sugar tested every day: No Musculoskeletal Medical History: Positive for: Gout; Osteoarthritis Negative for: Rheumatoid Arthritis; Osteomyelitis Neurologic Medical History: Positive for: Neuropathy HBO Extended History Items Eyes: Cataracts Immunizations JAKOLBY, SEDIVY ([id number]) [id number] Page 12 of 13 Pneumococcal Vaccine: Received Pneumococcal Vaccination: No Implantable Devices None Hospitalization / Surgery History Type of Hospitalization/Surgery inpatient 09/2021 arterial stenting on legs. Family and Social History Cancer: No; Diabetes: Yes - Child; Heart Disease: No; Hereditary Spherocytosis: No; Hypertension: No; Kidney Disease: No; Lung Disease: No; Seizures: No; Stroke: Yes - Mother; Thyroid Problems: No; Tuberculosis: No; Former smoker; Marital Status - Divorced; Alcohol Use: Moderate; Drug Use: Prior History; Caffeine Use: Daily; Financial Concerns: No; Food, Clothing or Shelter Needs: No; Support System Lacking: No; Transportation Concerns: No Electronic Signature(s) Signed: 08/06/2022 4:31:19 PM By: [name] Entered By: Kenneth Corwin on 08/06/2022 09:57:10 -------------------------------------------------------------------------------- SuperBill Details Patient Name: Date of Service: Kenneth Ina MES L. 08/06/2022 Medical Record Number: [id number] Patient Account Number: [id number] Date of Birth/Sex: Treating RN: 05-04-1935 (87 y.o. [name] Primary Care Provider: Docia Chuck, [place] Other Clinician: Referring Provider: Treating Provider/Extender: [name]: 5 Diagnosis Coding ICD-10 Codes Code Description [phone]  Non-pressure chronic ulcer of other part of left lower leg with fat layer exposed L97.522 Non-pressure chronic ulcer of other part of left foot with fat layer exposed I87.312 Chronic venous hypertension (idiopathic) with ulcer of left lower extremity I70.245 Atherosclerosis of native arteries of left leg with ulceration of other part of foot E11.621 Type 2 diabetes mellitus with foot ulcer E11.622 Type 2 diabetes mellitus with other skin ulcer Facility Procedures : CPT4 Code: 53664403 Description: 11042 - DEB SUBQ TISSUE 20 SQ CM/< ICD-10 Diagnosis Description L97.822 Non-pressure chronic ulcer of other part of left lower leg with fat layer expose I87.312 Chronic venous hypertension (idiopathic) with ulcer of left lower extremity  E11.622 Type 2 diabetes mellitus with other skin ulcer Modifier: d Quantity: 1 : CPT4 Code: [id number] Description: 11045 - DEB SUBQ TISS EA ADDL 20CM ICD-10 Diagnosis Description L97.822 Non-pressure chronic ulcer of other part of left lower leg with fat layer expose I87.312 Chronic venous hypertension (idiopathic) with ulcer of left lower extremity  E11.622 Type 2 diabetes mellitus with other skin ulcer Modifier: d Quantity: 1 : CPT4 Code: [id number] Description: 97597 - DEBRIDE WOUND 1ST 20 SQ CM OR < ICD-10 Diagnosis Description L97.522 Non-pressure chronic ulcer of other part of left foot with fat layer  exposed I87.312 Chronic venous hypertension (idiopathic) with ulcer of left lower extremity  E11.622 Type 2 diabetes mellitus with other skin ulcer Modifier: Quantity: 1 Physician Procedures : CPT4 Code Description Modifier TOWNSEND, CUDWORTH ([id number]) [id number] 11042 - WC PHYS SUBQ TISS 20 SQ CM 1 ICD-10 Diagnosis Description L97.822 Non-pressure chronic ulcer of other part of left lower leg with fat layer  exposed I87.312 Chronic venous hypertension (idiopathic) with ulcer of left lower extremity E11.622 Type 2 diabetes  mellitus with other skin ulcer Quantity: f Page 13 of 13 : [id number] 11045 - WC PHYS SUBQ TISS EA ADDL 20 CM 1 ICD-10 Diagnosis Description L97.822 Non-pressure chronic ulcer of other part of left lower leg with fat layer exposed I87.312 Chronic venous hypertension (idiopathic) with ulcer of left lower extremity  E11.622 Type 2 diabetes mellitus with other skin ulcer Quantity: : [id number] - WC PHYS DEBR WO ANESTH 20 SQ CM 1 ICD-10 Diagnosis Description L97.522 Non-pressure chronic ulcer of other part of left foot with fat layer exposed I87.312 Chronic venous hypertension (idiopathic) with ulcer of left lower extremity  E11.622 Type 2 diabetes mellitus with other skin ulcer Quantity: Electronic Signature(s) Signed: 08/06/2022 4:31:19 PM By: [name] Entered By: Kenneth Corwin on 08/06/2022 10:00:58

## 2022-08-07 NOTE — Progress Notes (Signed)
YAXIEL, MINNIE ([id number]) [id number] Page 1 of 11 Visit Report for 08/06/2022 Arrival Information Details Patient Name: Date of Service: [name], [name]. 08/06/2022 8:45 A M Medical Record Number: [id number] Patient Account Number: [id number] Date of Birth/Sex: Treating RN: [birth year] (87 y.o. M) Primary Care [name]: [name] Other Clinician: Referring [name]: Treating [name]/Extender: [name], Hood Weeks in Treatment: 5 Visit Information History Since Last Visit Added or deleted any medications: No Patient Arrived: Walker Any new allergies or adverse reactions: No Arrival Time: 09:04 Had a fall or experienced change in No Accompanied By: daughter activities of daily living that may affect Transfer Assistance: None risk of falls: Patient Identification Verified: Yes Signs or symptoms of abuse/neglect since last visito No Secondary Verification Process Completed: Yes Hospitalized since last visit: No Patient Has Alerts: Yes Implantable device outside of the clinic excluding No Patient Alerts: ABI R 0.89 (06/05/22) cellular tissue based products placed in the center ABI Hood 0.52 (06/05/22) since last visit: Has Dressing in Place as Prescribed: Yes Has Compression in Place as Prescribed: Yes Pain Present Now: No Electronic Signature(s) Signed: 08/07/2022 11:53:17 AM By: [name] Entered By: [name] on 08/06/2022 09:05:16 -------------------------------------------------------------------------------- Encounter Discharge Information Details Patient Name: Date of Service: [name]. 08/06/2022 8:45 A M Medical Record Number: [id number] Patient Account Number: [id number] Date of Birth/Sex: Treating RN: 10/10/1935 (87 y.o. [name] Primary Care Jerrian Mells: [name] Other Clinician: Referring [name]: Treating [name]/Extender: [name], Hood Weeks in Treatment: 5 Encounter Discharge  Information Items Post Procedure Vitals Discharge Condition: Stable Temperature (F): 97.9 Ambulatory Status: Walker Pulse (bpm): 70 Discharge Destination: Home Respiratory Rate (breaths/min): 18 Transportation: Private Auto Blood Pressure (mmHg): 97/58 Accompanied By: daughter Schedule Follow-up Appointment: Yes Clinical Summary of Care: Electronic Signature(s) Signed: 08/06/2022 5:47:40 PM By: [name] Entered By: Kenneth Stall on 08/06/2022 09:56:02 [name] ([id number]) [id number] Page 2 of 11 -------------------------------------------------------------------------------- Lower Extremity Assessment Details Patient Name: Date of Service: [name] MES Hood. 08/06/2022 8:45 A M Medical Record Number: [id number] Patient Account Number: [id number] Date of Birth/Sex: Treating RN: [birth year] (87 y.o. M) Primary Care Daejah Klebba: [name] Other Clinician: Referring Brigit Doke: Treating Urijah Raynor/Extender: [name], Hood Weeks in Treatment: 5 Edema Assessment Assessed: [Left: No] [Right: No] [Left: Edema] [Right: :] Calf Left: Right: Point of Measurement: From Medial Instep 35 cm Ankle Left: Right: Point of Measurement: From Medial Instep 23 cm Electronic Signature(s) Signed: 08/07/2022 11:53:17 AM By: [name] Entered By: [name] on 08/06/2022 09:18:59 -------------------------------------------------------------------------------- Multi Wound Chart Details Patient Name: Date of Service: [name]. 08/06/2022 8:45 A M Medical Record Number: [id number] Patient Account Number: [id number] Date of Birth/Sex: Treating RN: [birth year] (87 y.o. 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 Diabetic Wound/Ulcer of the Lower Primary Etiology: Extremity Extremity Arterial Insufficiency Ulcer N/A Arterial Insufficiency Ulcer Secondary Etiology: Cataracts, Angina, Type II Diabetes, Cataracts, Angina, Type II Diabetes, Cataracts, Angina, Type II Diabetes, Comorbid HistoryJAVONTAE, [name] ([id number]) [id number] Page 3 of 11 Gout, Osteoarthritis, Neuropathy Gout, Osteoarthritis, Neuropathy Gout, Osteoarthritis, Neuropathy 05/18/2022 05/18/2022 05/18/2022 Date Acquired:  5 5 0 Weeks of Treatment: Open Open Open Wound Status: No No No Wound Recurrence: No No Yes Clustered Wound: 3 N/A 2 Clustered Quantity: 7.5x3.8x0.1 1.5x1x0.1 3.5x2.5x0.2 Measurements Hood x W x D (cm) 22.384 1.178 6.872 A (cm) : rea 2.238 0.118 1.374 Volume (cm) : 48.40% 25.00% N/A % Reduction in Area: 48.40% 62.40% N/A % Reduction in Volume: Grade 1 Full Thickness Without Exposed Grade 1 Classification: Support Structures Medium Medium Medium Exudate A mount: Serosanguineous Serosanguineous Serosanguineous Exudate Type: red, brown red, brown red, brown Exudate Color: Distinct, outline attached N/A Distinct, outline attached Wound Margin: Large (67-100%) Large (67-100%) Small (1-33%) Granulation A mount: Red Red Red, Pink Granulation Quality: Small (1-33%) Small (1-33%) Large (67-100%) Necrotic A mount: Fat Layer (Subcutaneous Tissue): Yes Fat Layer (Subcutaneous Tissue): Yes Fat Layer (Subcutaneous Tissue): Yes Exposed Structures: Fascia: No Fascia: No Fascia: No Tendon: No Tendon: No Tendon: No Muscle: No Muscle: No Muscle: No Joint: No Joint:  No Joint: No Bone: No Bone: No Bone: No None None Small (1-33%) Epithelialization: Debridement - Excisional N/A Debridement - Selective/Open Wound Debridement: Pre-procedure Verification/Time Out 09:40 N/A 09:40 Taken: Lidocaine 4% Topical Solution N/A Lidocaine 4% Topical Solution Pain Control: Subcutaneous, Slough N/A Slough Tissue Debrided: Skin/Subcutaneous Tissue N/A Skin/Epidermis Level: 22.37 N/A 6.87 Debridement A (sq cm): rea Curette N/A Curette Instrument: Minimum N/A Minimum Bleeding: 0 N/A 0 Procedural Pain: 0 N/A 0 Post Procedural Pain: Procedure was tolerated well N/A Procedure was tolerated well Debridement Treatment Response: 7.5x3.8x0.2 N/A 3.5x2.5x0.2 Post Debridement Measurements Hood x W x D (cm) 4.477 N/A 1.374 Post Debridement Volume: (cm) Excoriation: No Excoriation: No Scarring: Yes Periwound Skin Texture: Induration: No Induration: No Excoriation: No Callus: No Callus: No Induration: No Crepitus: No Crepitus: No Callus: No Rash: No Rash: No Crepitus: No Scarring: No Scarring: No Rash: No Maceration: No Maceration: No Maceration: No Periwound Skin Moisture: Dry/Scaly: No Dry/Scaly: No Dry/Scaly: No Atrophie Blanche: No Atrophie Blanche: No Hemosiderin Staining: Yes Periwound Skin Color: Cyanosis: No Cyanosis: No Atrophie Blanche: No Ecchymosis: No Ecchymosis: No Cyanosis: No Erythema: No Erythema: No Ecchymosis: No Hemosiderin Staining: No Hemosiderin Staining: No Erythema: No Mottled: No Mottled: No Mottled: No Pallor: No Pallor: No Pallor: No Rubor: No Rubor: No Rubor: No separated wounds. N/A N/A Assessment Notes: Debridement N/A N/A Procedures Performed: Treatment Notes Electronic Signature(s) Signed: 08/06/2022 4:31:19 PM By: Geralyn Corwin DO Entered By: Geralyn Corwin on 08/06/2022 09:52:23 -------------------------------------------------------------------------------- Multi-Disciplinary Care  Plan Details Patient Name: Date of Service: [name]. 08/06/2022 8:45 A M Medical Record Number: [id number] Patient Account Number: [id number] Date of Birth/Sex: Treating RN: 08-Jul-1935 (87 y.o. [name] Primary Care Iosefa Weintraub: Calhoun, Hood Other Clinician: ARUSH, [name] ([id number]) [id number] Page 4 of 11 Referring Ritamarie Arkin: Treating Nicolo Tomko/Extender: [name], Hood Weeks in Treatment: 5 Active Inactive Wound/Skin Impairment Nursing Diagnoses: Impaired tissue integrity Goals: Patient/caregiver will verbalize understanding of skin care regimen Date Initiated: 07/02/2022 Target Resolution Date: 11/04/2022 Goal Status: Active Interventions: Provide education on smoking Treatment Activities: Skin care regimen initiated : 07/02/2022 Notes: Electronic Signature(s) Signed: 08/06/2022 5:47:40 PM By: [name] Entered By: Kenneth Stall on 08/06/2022 09:12:45 -------------------------------------------------------------------------------- Pain Assessment Details Patient Name: Date of Service: [name]. 08/06/2022 8:45 A M Medical Record Number: [id number] Patient Account Number: [id number] Date of Birth/Sex: Treating RN: May 27, 1935 (87 y.o. M) Primary Care Samarie Pinder: [name] Other Clinician: Referring Gilman Olazabal: Treating Sherman Lipuma/Extender: [name], Hood Weeks in Treatment: 5 Active Problems Location of Pain  Severity and Description of Pain Patient Has Paino No Site Locations Pain Management and Medication Current Pain Management: Electronic Signature(s) Signed: 08/07/2022 11:53:17 AM By: John Giovanni, Dhyan Hood 08/07/2022 11:53:17 AM By: [name] Signed: ([id number]) [id number] Page 5 of 11 Entered By: [name] on 08/06/2022 09:18:21 -------------------------------------------------------------------------------- Patient/Caregiver Education  Details Patient Name: Date of Service: RIKU, BUTTERY MES Hood. 8/1/2024andnbsp8:45 A M Medical Record Number: [id number] Patient Account Number: [id number] Date of Birth/Gender: Treating RN: [birth year] (87 y.o. [name] Primary Care Physician: [name] Other Clinician: Referring Physician: Treating Physician/Extender: [name], Hood Weeks in Treatment: 5 Education Assessment Education Provided To: Patient Education Topics Provided Wound/Skin Impairment: Handouts: Caring for Your Ulcer Methods: Explain/Verbal Responses: Reinforcements needed Electronic Signature(s) Signed: 08/06/2022 5:47:40 PM By: [name] Entered By: Kenneth Stall on 08/06/2022 09:12:56 -------------------------------------------------------------------------------- Wound Assessment Details Patient Name: Date of Service: [name]. 08/06/2022 8:45 A M Medical Record Number: [id number] Patient Account Number: [id number] Date of Birth/Sex: Treating RN: Jul 30, 1935 (87 y.o. M) Primary Care Marlea Gambill: [name] Other Clinician: Referring Lummie Montijo: Treating Constantin Hillery/Extender: [name], Hood Weeks in Treatment: 5 Wound Status Wound Number: 6 Primary Etiology: Diabetic Wound/Ulcer of the Lower Extremity Wound Location: Left, Medial Lower Leg Secondary Arterial Insufficiency Ulcer Etiology: Wounding Event: Gradually Appeared Wound Status: Open Date Acquired: 05/18/2022 Comorbid Cataracts, Angina, Type II Diabetes, Gout, Osteoarthritis, Weeks Of Treatment: 5 History: Neuropathy Clustered Wound: No Photos [name], [name] ([id number]) [id number] Page 6 of 11 Wound Measurements Length: (cm) Width: (cm) Depth: (cm) Clustered Quantity: Area: (cm) Volume: (cm) 7.5 % Reduction in Area: 48.4% 3.8 % Reduction in Volume: 48.4% 0.1 Epithelialization: None 3 Tunneling: No 22.384 Undermining: No 2.238 Wound  Description Classification: Grade 1 Wound Margin: Distinct, outline attached Exudate Amount: Medium Exudate Type: Serosanguineous Exudate Color: red, brown Foul Odor After Cleansing: No Slough/Fibrino Yes Wound Bed Granulation Amount: Large (67-100%) Exposed Structure Granulation Quality: Red Fascia Exposed: No Necrotic Amount: Small (1-33%) Fat Layer (Subcutaneous Tissue) Exposed: Yes Necrotic Quality: Adherent Slough Tendon Exposed: No Muscle Exposed: No Joint Exposed: No Bone Exposed: No Periwound Skin Texture Texture Color No Abnormalities Noted: No No Abnormalities Noted: No Callus: No Atrophie Blanche: No Crepitus: No Cyanosis: No Excoriation: No Ecchymosis: No Induration: No Erythema: No Rash: No Hemosiderin Staining: No Scarring: No Mottled: No Pallor: No Moisture Rubor: No No Abnormalities Noted: No Dry / Scaly: No Maceration: No Assessment Notes separated wounds. Treatment Notes Wound #6 (Lower Leg) Wound Laterality: Left, Medial Cleanser Soap and Water Discharge Instruction: May shower and wash wound with dial antibacterial soap and water prior to dressing change. Vashe 5.8 (oz) Discharge Instruction: Cleanse the wound with Vashe prior to applying a clean dressing using gauze sponges, not tissue or cotton balls. Wound Cleanser Discharge Instruction: Cleanse the wound with wound cleanser prior to applying a clean dressing using gauze sponges, not tissue or cotton balls. Peri-Wound Care Sween Lotion (Moisturizing lotion) Discharge Instruction: Apply moisturizing lotion as directed Topical topical antibiotics [facility] pharmacy) Discharge Instruction: apply directly to wound bed. [name], [name] ([id number]) [id number] Page 7 of 11 Primary Dressing Maxorb Extra CMC/Alginate Dressing, 4x4 (in/in) Discharge Instruction: Apply to wound bed as instructed Secondary Dressing ABD Pad, 5x9 Discharge Instruction: Apply over  primary dressing as directed. Woven Gauze Sponge, Non-Sterile 4x4 in Discharge Instruction: Apply over primary dressing as directed. Zetuvit Plus 4x8 in Discharge Instruction: Apply over primary dressing as directed. Secured With Compression Wrap Kerlix Roll 4.5x3.1 (in/yd) Discharge Instruction: Apply  Kerlix and Coban compression as directed. Coban Self-Adherent Wrap 4x5 (in/yd) Discharge Instruction: Apply over Kerlix as directed. Compression Stockings Add-Ons Electronic Signature(s) Signed: 08/06/2022 5:47:40 PM By: [name] Entered By: Kenneth Stall on 08/06/2022 09:50:00 -------------------------------------------------------------------------------- Wound Assessment Details Patient Name: Date of Service: [name]. 08/06/2022 8:45 A M Medical Record Number: [id number] Patient Account Number: [id number] Date of Birth/Sex: Treating RN: 1935/12/30 (87 y.o. M) Primary Care Nichole Keltner: [name] Other Clinician: Referring Mallorie Norrod: Treating Sarkis Rhines/Extender: [name], Hood Weeks in Treatment: 5 Wound Status Wound Number: 7 Primary Arterial Insufficiency Ulcer Etiology: Wound Location: Left, Lateral Ankle Wound Status: Open Wounding Event: Gradually Appeared Comorbid Cataracts, Angina, Type II Diabetes, Gout, Osteoarthritis, Date Acquired: 05/18/2022 History: Neuropathy Weeks Of Treatment: 5 Clustered Wound: No Photos Wound Measurements Length: (cm) 1.5 Width: (cm) 1 Depth: (cm) 0.1 Area: (cm) 1.178 [name], [name] ([id number]) Volume: (cm) 0.118 % Reduction in Area: 25% % Reduction in Volume: 62.4% Epithelialization: None Tunneling: No [id number] Page 8 of 11 Undermining: No Wound Description Classification: Full Thickness Without Exposed Support Structures Exudate Amount: Medium Exudate Type: Serosanguineous Exudate Color: red, brown Foul Odor After Cleansing: No Slough/Fibrino Yes Wound  Bed Granulation Amount: Large (67-100%) Exposed Structure Granulation Quality: Red Fascia Exposed: No Necrotic Amount: Small (1-33%) Fat Layer (Subcutaneous Tissue) Exposed: Yes Necrotic Quality: Adherent Slough Tendon Exposed: No Muscle Exposed: No Joint Exposed: No Bone Exposed: No Periwound Skin Texture Texture Color No Abnormalities Noted: No No Abnormalities Noted: No Callus: No Atrophie Blanche: No Crepitus: No Cyanosis: No Excoriation: No Ecchymosis: No Induration: No Erythema: No Rash: No Hemosiderin Staining: No Scarring: No Mottled: No Pallor: No Moisture Rubor: No No Abnormalities Noted: No Dry / Scaly: No Maceration: No Treatment Notes Wound #7 (Ankle) Wound Laterality: Left, Lateral Cleanser Soap and Water Discharge Instruction: May shower and wash wound with dial antibacterial soap and water prior to dressing change. Vashe 5.8 (oz) Discharge Instruction: Cleanse the wound with Vashe prior to applying a clean dressing using gauze sponges, not tissue or cotton balls. Wound Cleanser Discharge Instruction: Cleanse the wound with wound cleanser prior to applying a clean dressing using gauze sponges, not tissue or cotton balls. Peri-Wound Care Sween Lotion (Moisturizing lotion) Discharge Instruction: Apply moisturizing lotion as directed Topical topical antibiotics [facility] pharmacy) Discharge Instruction: apply directly to wound bed. Primary Dressing Maxorb Extra CMC/Alginate Dressing, 4x4 (in/in) Discharge Instruction: Apply to wound bed as instructed Secondary Dressing ABD Pad, 5x9 Discharge Instruction: Apply over primary dressing as directed. Woven Gauze Sponge, Non-Sterile 4x4 in Discharge Instruction: Apply over primary dressing as directed. Zetuvit Plus 4x8 in Discharge Instruction: Apply over primary dressing as directed. Secured With Compression Wrap Kerlix Roll 4.5x3.1 (in/yd) Discharge Instruction: Apply Kerlix and Coban compression as  directed. Coban Self-Adherent Wrap 4x5 (in/yd) Discharge Instruction: Apply over Kerlix as directed. Compression Stockings [name], [name] ([id number]) [id number] Page 9 of 11 Add-Ons Electronic Signature(s) Signed: 08/07/2022 11:53:17 AM By: [name] Entered By: [name] on 08/06/2022 09:29:30 -------------------------------------------------------------------------------- Wound Assessment Details Patient Name: Date of Service: [name]. 08/06/2022 8:45 A M Medical Record Number: [id number] Patient Account Number: [id number] Date of Birth/Sex: Treating RN: [birth year] (87 y.o. [name] Primary Care Xhaiden Coombs: [name] Other Clinician: Referring Shlome Baldree: Treating Donzella Carrol/Extender: [name], Hood Weeks in Treatment: 5 Wound Status Wound Number: 9 Primary Etiology: Diabetic Wound/Ulcer of the Lower Extremity Wound Location: Left, Anterior Lower Leg Secondary Arterial Insufficiency Ulcer Etiology: Wounding Event: Gradually Appeared Wound Status: Open  Date Acquired: 05/18/2022 Comorbid Cataracts, Angina, Type II Diabetes, Gout, Osteoarthritis, Weeks Of Treatment: 0 History: Neuropathy Clustered Wound: Yes Wound Measurements Length: (cm) Width: (cm) Depth: (cm) Clustered Quantity: Area: (cm) Volume: (cm) 3.5 % Reduction in Area: 2.5 % Reduction in Volume: 0.2 Epithelialization: Small (1-33%) 2 Tunneling: No 6.872 Undermining: No 1.374 Wound Description Classification: Grade 1 Wound Margin: Distinct, outline attached Exudate Amount: Medium Exudate Type: Serosanguineous Exudate Color: red, brown Foul Odor After Cleansing: No Slough/Fibrino Yes Wound Bed Granulation Amount: Small (1-33%) Exposed Structure Granulation Quality: Red, Pink Fascia Exposed: No Necrotic Amount: Large (67-100%) Fat Layer (Subcutaneous Tissue) Exposed: Yes Necrotic Quality: Adherent Slough Tendon Exposed: No Muscle  Exposed: No Joint Exposed: No Bone Exposed: No Periwound Skin Texture Texture Color No Abnormalities Noted: No No Abnormalities Noted: No Callus: No Atrophie Blanche: No Crepitus: No Cyanosis: No Excoriation: No Ecchymosis: No Induration: No Erythema: No Rash: No Hemosiderin Staining: Yes Scarring: Yes Mottled: No Pallor: No Moisture Rubor: No No Abnormalities Noted: No Dry / Scaly: No Maceration: No Treatment Notes Wound #9 (Lower Leg) Wound Laterality: Left, Anterior [name], [name] ([id number]) [id number] Page 10 of 11 Cleanser Soap and Water Discharge Instruction: May shower and wash wound with dial antibacterial soap and water prior to dressing change. Vashe 5.8 (oz) Discharge Instruction: Cleanse the wound with Vashe prior to applying a clean dressing using gauze sponges, not tissue or cotton balls. Wound Cleanser Discharge Instruction: Cleanse the wound with wound cleanser prior to applying a clean dressing using gauze sponges, not tissue or cotton balls. Peri-Wound Care Sween Lotion (Moisturizing lotion) Discharge Instruction: Apply moisturizing lotion as directed Topical topical antibiotics [facility] pharmacy) Discharge Instruction: apply directly to wound bed. Primary Dressing Maxorb Extra CMC/Alginate Dressing, 4x4 (in/in) Discharge Instruction: Apply to wound bed as instructed Secondary Dressing ABD Pad, 5x9 Discharge Instruction: Apply over primary dressing as directed. Woven Gauze Sponge, Non-Sterile 4x4 in Discharge Instruction: Apply over primary dressing as directed. Zetuvit Plus 4x8 in Discharge Instruction: Apply over primary dressing as directed. Secured With Compression Wrap Kerlix Roll 4.5x3.1 (in/yd) Discharge Instruction: Apply Kerlix and Coban compression as directed. Coban Self-Adherent Wrap 4x5 (in/yd) Discharge Instruction: Apply over Kerlix as directed. Compression Stockings Add-Ons Electronic  Signature(s) Signed: 08/06/2022 5:47:40 PM By: [name] Entered By: Kenneth Stall on 08/06/2022 09:49:33 -------------------------------------------------------------------------------- Vitals Details Patient Name: Date of Service: [name]. 08/06/2022 8:45 A M Medical Record Number: [id number] Patient Account Number: [id number] Date of Birth/Sex: Treating RN: 08-29-1935 (87 y.o. M) Primary Care Lilyann Gravelle: [name] Other Clinician: Referring Macenzie Burford: Treating Alioune Hodgkin/Extender: [name], Hood Weeks in Treatment: 5 Vital Signs Time Taken: 09:17 Temperature (F): 97.9 Height (in): 68 Pulse (bpm): 70 Weight (lbs): 190 Respiratory Rate (breaths/min): 18 Body Mass Index (BMI): 28.9 Blood Pressure (mmHg): 97/58 Reference Range: 80 - 120 mg / dl Electronic Signature(s) Signed: 08/07/2022 11:53:17 AM By: [name] Entered By: [name] on 08/06/2022 09:18:13 [name] ([id number]) [id number] Page 11 of 11

## 2022-08-10 ENCOUNTER — Encounter (HOSPITAL_BASED_OUTPATIENT_CLINIC_OR_DEPARTMENT_OTHER): Payer: Medicare Other | Admitting: Internal Medicine

## 2022-08-10 DIAGNOSIS — I70245 Atherosclerosis of native arteries of left leg with ulceration of other part of foot: Secondary | ICD-10-CM | POA: Diagnosis not present

## 2022-08-10 DIAGNOSIS — E1142 Type 2 diabetes mellitus with diabetic polyneuropathy: Secondary | ICD-10-CM | POA: Diagnosis not present

## 2022-08-10 DIAGNOSIS — E11622 Type 2 diabetes mellitus with other skin ulcer: Secondary | ICD-10-CM | POA: Diagnosis not present

## 2022-08-10 DIAGNOSIS — L97822 Non-pressure chronic ulcer of other part of left lower leg with fat layer exposed: Secondary | ICD-10-CM | POA: Diagnosis not present

## 2022-08-10 DIAGNOSIS — I87312 Chronic venous hypertension (idiopathic) with ulcer of left lower extremity: Secondary | ICD-10-CM | POA: Diagnosis not present

## 2022-08-10 DIAGNOSIS — L97522 Non-pressure chronic ulcer of other part of left foot with fat layer exposed: Secondary | ICD-10-CM | POA: Diagnosis not present

## 2022-08-10 DIAGNOSIS — E11621 Type 2 diabetes mellitus with foot ulcer: Secondary | ICD-10-CM | POA: Diagnosis not present

## 2022-08-10 NOTE — Progress Notes (Addendum)
DVANTE, ROGIER ([id number]) [id number] Page 1 of 6 Visit Report for 08/10/2022 Arrival Information Details Patient Name: Date of Service: [name], Kenneth MES L. 08/10/2022 3:45 PM Medical Record Number: [id number] Patient Account Number: [id number] Date of Birth/Sex: Treating RN: 10-17-1935 (87 y.o. M) Primary Care Juliya Magill: Docia Chuck, Dibas Other Clinician: Referring Cecil Bixby: Treating Margerite Impastato/Extender: Eda Keys, Dibas Weeks in Treatment: 5 Visit Information History Since Last Visit Added or deleted any medications: No Patient Arrived: Walker Any new allergies or adverse reactions: No Arrival Time: 16:20 Had a fall or experienced change in No Accompanied By: son in law activities of daily living that may affect Transfer Assistance: None risk of falls: Patient Identification Verified: Yes Signs or symptoms of abuse/neglect since last visito No Secondary Verification Process Completed: Yes Hospitalized since last visit: No Patient Has Alerts: Yes Implantable device outside of the clinic excluding No Patient Alerts: ABI R 0.89 (06/05/22) cellular tissue based products placed in the center ABI L 0.52 (06/05/22) since last visit: Has Dressing in Place as Prescribed: No Has Compression in Place as Prescribed: No Pain Present Now: No Notes Removed wrap. Patient states he took the coban off because he leg was swollen and red from the coban. Electronic Signature(s) Signed: 08/10/2022 5:04:15 PM By: Thayer Dallas Entered By: Thayer Dallas on 08/10/2022 17:00:17 -------------------------------------------------------------------------------- Clinic Level of Care Assessment Details Patient Name: Date of Service: [name], Kenneth MES L. 08/10/2022 3:45 PM Medical Record Number: [id number] Patient Account Number: [id number] Date of Birth/Sex: Treating RN: 1935-12-24 (87 y.o. M) Primary Care Deshawn Skelley: Docia Chuck, Dibas Other Clinician: Thayer Dallas Referring  Cloee Dunwoody: Treating Yarelin Reichardt/Extender: Eda Keys, Dibas Weeks in Treatment: 5 Clinic Level of Care Assessment Items TOOL 4 Quantity Score X- 1 0 Use when only an EandM is performed on FOLLOW-UP visit ASSESSMENTS - Nursing Assessment / Reassessment X- 1 10 Reassessment of Co-morbidities (includes updates in patient status) X- 1 5 Reassessment of Adherence to Treatment Plan ASSESSMENTS - Wound and Skin A ssessment / Reassessment []  - 0 Simple Wound Assessment / Reassessment - one wound []  - 0 Complex Wound Assessment / Reassessment - multiple wounds []  - 0 Dermatologic / Skin Assessment (not related to wound area) ASSESSMENTS - Focused Assessment NITESH, KRETZER ([id number]) [id number] Page 2 of 6 []  - 0 Circumferential Edema Measurements - multi extremities []  - 0 Nutritional Assessment / Counseling / Intervention []  - 0 Lower Extremity Assessment (monofilament, tuning fork, pulses) []  - 0 Peripheral Arterial Disease Assessment (using hand held doppler) ASSESSMENTS - Ostomy and/or Continence Assessment and Care []  - 0 Incontinence Assessment and Management []  - 0 Ostomy Care Assessment and Management (repouching, etc.) PROCESS - Coordination of Care X - Simple Patient / Family Education for ongoing care 1 15 []  - 0 Complex (extensive) Patient / Family Education for ongoing care []  - 0 Staff obtains Chiropractor, Records, T Results / Process Orders est []  - 0 Staff telephones HHA, Nursing Homes / Clarify orders / etc []  - 0 Routine Transfer to another Facility (non-emergent condition) []  - 0 Routine Hospital Admission (non-emergent condition) []  - 0 New Admissions / Manufacturing engineer / Ordering NPWT Apligraf, etc. , []  - 0 Emergency Hospital Admission (emergent condition) X- 1 10 Simple Discharge Coordination []  - 0 Complex (extensive) Discharge Coordination PROCESS - Special Needs []  - 0 Pediatric / Minor Patient  Management []  - 0 Isolation Patient Management []  - 0 Hearing / Language / Visual special needs []  - 0 Assessment of Community assistance (transportation, D/C planning, etc.) []  -  0 Additional assistance / Altered mentation []  - 0 Support Surface(s) Assessment (bed, cushion, seat, etc.) INTERVENTIONS - Wound Cleansing / Measurement []  - 0 Simple Wound Cleansing - one wound X- 4 5 Complex Wound Cleansing - multiple wounds []  - 0 Wound Imaging (photographs - any number of wounds) []  - 0 Wound Tracing (instead of photographs) []  - 0 Simple Wound Measurement - one wound []  - 0 Complex Wound Measurement - multiple wounds INTERVENTIONS - Wound Dressings []  - 0 Small Wound Dressing one or multiple wounds X- 1 15 Medium Wound Dressing one or multiple wounds X- 1 20 Large Wound Dressing one or multiple wounds []  - 0 Application of Medications - topical []  - 0 Application of Medications - injection INTERVENTIONS - Miscellaneous []  - 0 External ear exam []  - 0 Specimen Collection (cultures, biopsies, blood, body fluids, etc.) []  - 0 Specimen(s) / Culture(s) sent or taken to Lab for analysis []  - 0 Patient Transfer (multiple staff / Michiel Sites Lift / Similar devices) []  - 0 Simple Staple / Suture removal (25 or less) RUCKER, TOMA L ([id number]) [id number].pdf Page 3 of 6 []  - 0 Complex Staple / Suture removal (26 or more) []  - 0 Hypo / Hyperglycemic Management (close monitor of Blood Glucose) []  - 0 Ankle / Brachial Index (ABI) - do not check if billed separately []  - 0 Vital Signs Has the patient been seen at the hospital within the last three years: Yes Total Score: 95 Level Of Care: New/Established - Level 3 Electronic Signature(s) Signed: 08/10/2022 5:04:15 PM By: Thayer Dallas Entered By: Thayer Dallas on 08/10/2022 17:01:48 -------------------------------------------------------------------------------- Encounter Discharge Information  Details Patient Name: Date of Service: Kenneth Ina MES L. 08/10/2022 3:45 PM Medical Record Number: [id number] Patient Account Number: [id number] Date of Birth/Sex: Treating RN: [birth year] (87 y.o. M) Primary Care Herberta Pickron: Docia Chuck, Dibas Other Clinician: Thayer Dallas Referring Sumit Branham: Treating Rodolfo Gaster/Extender: Eda Keys, Dibas Weeks in Treatment: 5 Encounter Discharge Information Items Discharge Condition: Stable Ambulatory Status: Walker Discharge Destination: Home Transportation: Private Auto Accompanied By: son in law Schedule Follow-up Appointment: Yes Clinical Summary of Care: Electronic Signature(s) Signed: 08/10/2022 5:04:15 PM By: Thayer Dallas Entered By: Thayer Dallas on 08/10/2022 17:03:44 -------------------------------------------------------------------------------- Multi-Disciplinary Care Plan Details Patient Name: Date of Service: Kenneth Ina MES L. 08/10/2022 3:45 PM Medical Record Number: [id number] Patient Account Number: [id number] Date of Birth/Sex: Treating RN: [birth year] (87 y.o. [name] Primary Care Miciah Shealy: Docia Chuck, Dibas Other Clinician: Referring Reno Clasby: Treating Dearius Hoffmann/Extender: Eda Keys, Dibas Weeks in Treatment: 5 Active Inactive Electronic Signature(s) Signed: 09/01/2022 5:25:24 PM By: Shawn Stall RN, BSN Entered By: Shawn Stall on 09/01/2022 17:25:24 Mechele Collin ([id number]) [id number] Page 4 of 6 -------------------------------------------------------------------------------- Patient/Caregiver Education Details Patient Name: Date of Service: [name], Kenneth MES L. 8/5/2024andnbsp3:45 PM Medical Record Number: [id number] Patient Account Number: [id number] Date of Birth/Gender: Treating RN: 1935-01-13 (87 y.o. M) Primary Care Physician: Docia Chuck, Dibas Other Clinician: Thayer Dallas Referring Physician: Treating Physician/Extender: Eda Keys, Dibas Weeks in  Treatment: 5 Education Assessment Education Provided To: Patient Education Topics Provided Electronic Signature(s) Signed: 08/10/2022 5:04:15 PM By: Thayer Dallas Entered By: Thayer Dallas on 08/10/2022 17:03:24 -------------------------------------------------------------------------------- Wound Assessment Details Patient Name: Date of Service: Kenneth Ina MES L. 08/10/2022 3:45 PM Medical Record Number: [id number] Patient Account Number: [id number] Date of Birth/Sex: Treating RN: 01-May-1935 (87 y.o. M) Primary Care Sherin Murdoch: Docia Chuck, Dibas Other Clinician: Referring Keyaira Clapham: Treating Taraya Steward/Extender: Eda Keys, Dibas Weeks in Treatment: 5 Wound Status Wound Number: 6 Primary Etiology: Diabetic  Wound/Ulcer of the Lower Extremity Wound Location: Left, Medial Lower Leg Secondary Etiology: Arterial Insufficiency Ulcer Wounding Event: Gradually Appeared Wound Status: Open Date Acquired: 05/18/2022 Weeks Of Treatment: 5 Clustered Wound: No Wound Measurements Length: (cm) 7.5 Width: (cm) 3.8 Depth: (cm) 0.1 Area: (cm) 22.384 Volume: (cm) 2.238 % Reduction in Area: 48.4% % Reduction in Volume: 48.4% Wound Description Classification: Grade 1 Exudate Amount: Medium Exudate Type: Serosanguineous Exudate Color: red, brown Periwound Skin Texture Texture Color No Abnormalities Noted: No No Abnormalities Noted: No Moisture No Abnormalities Noted: No Electronic Signature(sJERETT, TALAVERA ([id number]) [id number] Page 5 of 6 Signed: 08/10/2022 5:04:15 PM By: Thayer Dallas Entered By: Thayer Dallas on 08/10/2022 17:00:47 -------------------------------------------------------------------------------- Wound Assessment Details Patient Name: Date of Service: Kenneth Ina MES L. 08/10/2022 3:45 PM Medical Record Number: [id number] Patient Account Number: [id number] Date of Birth/Sex: Treating RN: 08/04/1935 (87 y.o. M) Primary Care Mayes Sangiovanni:  Docia Chuck, Dibas Other Clinician: Referring Tracye Szuch: Treating Dodd Schmid/Extender: Eda Keys, Dibas Weeks in Treatment: 5 Wound Status Wound Number: 7 Primary Etiology: Arterial Insufficiency Ulcer Wound Location: Left, Lateral Ankle Wound Status: Open Wounding Event: Gradually Appeared Date Acquired: 05/18/2022 Weeks Of Treatment: 5 Clustered Wound: No Wound Measurements Length: (cm) 1.5 Width: (cm) 1 Depth: (cm) 0.1 Area: (cm) 1.178 Volume: (cm) 0.118 % Reduction in Area: 25% % Reduction in Volume: 62.4% Wound Description Classification: Full Thickness Without Exposed Support Exudate Amount: Medium Exudate Type: Serosanguineous Exudate Color: red, brown Structures Periwound Skin Texture Texture Color No Abnormalities Noted: No No Abnormalities Noted: No Moisture No Abnormalities Noted: No Electronic Signature(s) Signed: 08/10/2022 5:04:15 PM By: Thayer Dallas Entered By: Thayer Dallas on 08/10/2022 17:00:47 -------------------------------------------------------------------------------- Wound Assessment Details Patient Name: Date of Service: Kenneth Ina MES L. 08/10/2022 3:45 PM Medical Record Number: [id number] Patient Account Number: [id number] Date of Birth/Sex: Treating RN: Dec 17, 1935 (87 y.o. M) Primary Care Rockford Leinen: Docia Chuck, Dibas Other Clinician: Referring Hoorain Kozakiewicz: Treating Cleaster Shiffer/Extender: Eda Keys, Dibas Weeks in Treatment: 5 Wound Status Wound Number: 9 Primary Etiology: Diabetic Wound/Ulcer of the Lower Extremity Wound Location: Left, Anterior Lower Leg Secondary Etiology: Arterial Insufficiency Ulcer Wounding Event: Gradually Appeared Wound Status: Open Date Acquired: 05/18/2022 BOSSIE, GIAMBRA ([id number]) [phone].pdf Page 6 of 6 Weeks Of Treatment: 0 Clustered Wound: Yes Wound Measurements Length: (cm) 3.5 Width: (cm) 2.5 Depth: (cm) 0.2 Area: (cm) 6.872 Volume: (cm) 1.374 % Reduction in  Area: 0% % Reduction in Volume: 0% Wound Description Classification: Grade 1 Exudate Amount: Medium Exudate Type: Serosanguineous Exudate Color: red, brown Periwound Skin Texture Texture Color No Abnormalities Noted: No No Abnormalities Noted: No Moisture No Abnormalities Noted: No Electronic Signature(s) Signed: 08/10/2022 5:04:15 PM By: Thayer Dallas Entered By: Thayer Dallas on 08/10/2022 17:00:47 -------------------------------------------------------------------------------- Vitals Details Patient Name: Date of Service: Kenneth Ina MES L. 08/10/2022 3:45 PM Medical Record Number: [id number] Patient Account Number: [id number] Date of Birth/Sex: Treating RN: Dec 06, 1935 (87 y.o. M) Primary Care Adelaide Pfefferkorn: Docia Chuck, Dibas Other Clinician: Referring Gypsy Kellogg: Treating Talaya Lamprecht/Extender: Eda Keys, Dibas Weeks in Treatment: 5 Vital Signs Time Taken: 16:45 Reference Range: 80 - 120 mg / dl Height (in): 68 Weight (lbs): 190 Body Mass Index (BMI): 28.9 Electronic Signature(s) Signed: 08/10/2022 5:04:15 PM By: Thayer Dallas Entered By: Thayer Dallas on 08/10/2022 17:00:25

## 2022-08-11 NOTE — Progress Notes (Signed)
SHAHMIR, KULZER ([id number]) [id number] Page 1 of 1 Visit Report for 08/10/2022 SuperBill Details Patient Name: Date of Service: [name], [name] MES L. 08/10/2022 Medical Record Number: [id number] Patient Account Number: [id number] Date of Birth/Sex: Treating RN: 05-Apr-1935 (87 y.o. 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

## 2022-08-13 ENCOUNTER — Ambulatory Visit (HOSPITAL_BASED_OUTPATIENT_CLINIC_OR_DEPARTMENT_OTHER): Payer: Medicare Other | Admitting: Internal Medicine

## 2022-08-16 DIAGNOSIS — S0990XA Unspecified injury of head, initial encounter: Secondary | ICD-10-CM | POA: Diagnosis not present

## 2022-08-16 DIAGNOSIS — Z7984 Long term (current) use of oral hypoglycemic drugs: Secondary | ICD-10-CM | POA: Diagnosis not present

## 2022-08-16 DIAGNOSIS — S299XXA Unspecified injury of thorax, initial encounter: Secondary | ICD-10-CM | POA: Diagnosis not present

## 2022-08-16 DIAGNOSIS — E1142 Type 2 diabetes mellitus with diabetic polyneuropathy: Secondary | ICD-10-CM | POA: Diagnosis not present

## 2022-08-16 DIAGNOSIS — I4821 Permanent atrial fibrillation: Secondary | ICD-10-CM | POA: Diagnosis not present

## 2022-08-16 DIAGNOSIS — G928 Other toxic encephalopathy: Principal | ICD-10-CM | POA: Diagnosis present

## 2022-08-16 DIAGNOSIS — Z043 Encounter for examination and observation following other accident: Secondary | ICD-10-CM | POA: Diagnosis not present

## 2022-08-16 DIAGNOSIS — T426X5A Adverse effect of other antiepileptic and sedative-hypnotic drugs, initial encounter: Secondary | ICD-10-CM | POA: Diagnosis present

## 2022-08-16 DIAGNOSIS — R109 Unspecified abdominal pain: Secondary | ICD-10-CM | POA: Diagnosis not present

## 2022-08-16 DIAGNOSIS — N179 Acute kidney failure, unspecified: Secondary | ICD-10-CM | POA: Diagnosis not present

## 2022-08-16 DIAGNOSIS — L89610 Pressure ulcer of right heel, unstageable: Secondary | ICD-10-CM | POA: Diagnosis present

## 2022-08-16 DIAGNOSIS — Z8673 Personal history of transient ischemic attack (TIA), and cerebral infarction without residual deficits: Secondary | ICD-10-CM | POA: Diagnosis not present

## 2022-08-16 DIAGNOSIS — H547 Unspecified visual loss: Secondary | ICD-10-CM | POA: Diagnosis present

## 2022-08-16 DIAGNOSIS — Z96612 Presence of left artificial shoulder joint: Secondary | ICD-10-CM | POA: Diagnosis not present

## 2022-08-16 DIAGNOSIS — I491 Atrial premature depolarization: Secondary | ICD-10-CM | POA: Diagnosis not present

## 2022-08-16 DIAGNOSIS — I499 Cardiac arrhythmia, unspecified: Secondary | ICD-10-CM | POA: Diagnosis not present

## 2022-08-16 DIAGNOSIS — R911 Solitary pulmonary nodule: Secondary | ICD-10-CM | POA: Diagnosis not present

## 2022-08-16 DIAGNOSIS — K449 Diaphragmatic hernia without obstruction or gangrene: Secondary | ICD-10-CM | POA: Diagnosis not present

## 2022-08-16 DIAGNOSIS — L89620 Pressure ulcer of left heel, unstageable: Secondary | ICD-10-CM | POA: Diagnosis not present

## 2022-08-16 DIAGNOSIS — Z96611 Presence of right artificial shoulder joint: Secondary | ICD-10-CM | POA: Diagnosis present

## 2022-08-16 DIAGNOSIS — E860 Dehydration: Secondary | ICD-10-CM | POA: Diagnosis not present

## 2022-08-16 DIAGNOSIS — W19XXXA Unspecified fall, initial encounter: Secondary | ICD-10-CM | POA: Diagnosis not present

## 2022-08-16 DIAGNOSIS — Z791 Long term (current) use of non-steroidal anti-inflammatories (NSAID): Secondary | ICD-10-CM

## 2022-08-16 DIAGNOSIS — M109 Gout, unspecified: Secondary | ICD-10-CM | POA: Diagnosis present

## 2022-08-16 DIAGNOSIS — S199XXA Unspecified injury of neck, initial encounter: Secondary | ICD-10-CM | POA: Diagnosis not present

## 2022-08-16 DIAGNOSIS — Z95820 Peripheral vascular angioplasty status with implants and grafts: Secondary | ICD-10-CM

## 2022-08-16 DIAGNOSIS — Z87891 Personal history of nicotine dependence: Secondary | ICD-10-CM

## 2022-08-16 DIAGNOSIS — F1010 Alcohol abuse, uncomplicated: Secondary | ICD-10-CM | POA: Diagnosis present

## 2022-08-16 DIAGNOSIS — I6523 Occlusion and stenosis of bilateral carotid arteries: Secondary | ICD-10-CM | POA: Diagnosis not present

## 2022-08-16 DIAGNOSIS — J439 Emphysema, unspecified: Secondary | ICD-10-CM | POA: Diagnosis not present

## 2022-08-16 DIAGNOSIS — Z91040 Latex allergy status: Secondary | ICD-10-CM

## 2022-08-16 DIAGNOSIS — R410 Disorientation, unspecified: Secondary | ICD-10-CM | POA: Diagnosis not present

## 2022-08-16 DIAGNOSIS — Z7982 Long term (current) use of aspirin: Secondary | ICD-10-CM

## 2022-08-16 DIAGNOSIS — Z96653 Presence of artificial knee joint, bilateral: Secondary | ICD-10-CM | POA: Diagnosis present

## 2022-08-16 DIAGNOSIS — Z886 Allergy status to analgesic agent status: Secondary | ICD-10-CM | POA: Diagnosis not present

## 2022-08-16 DIAGNOSIS — I5032 Chronic diastolic (congestive) heart failure: Secondary | ICD-10-CM | POA: Diagnosis present

## 2022-08-16 DIAGNOSIS — I4891 Unspecified atrial fibrillation: Secondary | ICD-10-CM | POA: Diagnosis not present

## 2022-08-16 DIAGNOSIS — Z888 Allergy status to other drugs, medicaments and biological substances status: Secondary | ICD-10-CM

## 2022-08-16 DIAGNOSIS — L89150 Pressure ulcer of sacral region, unstageable: Secondary | ICD-10-CM | POA: Diagnosis present

## 2022-08-16 DIAGNOSIS — I731 Thromboangiitis obliterans [Buerger's disease]: Secondary | ICD-10-CM | POA: Diagnosis present

## 2022-08-16 DIAGNOSIS — Z79899 Other long term (current) drug therapy: Secondary | ICD-10-CM

## 2022-08-16 DIAGNOSIS — E872 Acidosis, unspecified: Secondary | ICD-10-CM | POA: Diagnosis present

## 2022-08-16 DIAGNOSIS — Z833 Family history of diabetes mellitus: Secondary | ICD-10-CM

## 2022-08-16 DIAGNOSIS — R0989 Other specified symptoms and signs involving the circulatory and respiratory systems: Secondary | ICD-10-CM | POA: Diagnosis not present

## 2022-08-16 DIAGNOSIS — Z7902 Long term (current) use of antithrombotics/antiplatelets: Secondary | ICD-10-CM

## 2022-08-16 DIAGNOSIS — Z96641 Presence of right artificial hip joint: Secondary | ICD-10-CM | POA: Diagnosis present

## 2022-08-16 DIAGNOSIS — I110 Hypertensive heart disease with heart failure: Secondary | ICD-10-CM | POA: Diagnosis present

## 2022-08-16 DIAGNOSIS — I700 Atherosclerosis of aorta: Secondary | ICD-10-CM | POA: Diagnosis not present

## 2022-08-17 ENCOUNTER — Inpatient Hospital Stay (HOSPITAL_COMMUNITY)
Admission: EM | Admit: 2022-08-17 | Discharge: 2022-08-19 | DRG: 092 | Disposition: A | Discharge status: Skilled Nursing Facility | Payer: Medicare Other | Attending: Family Medicine | Admitting: Family Medicine

## 2022-08-17 ENCOUNTER — Inpatient Hospital Stay (HOSPITAL_COMMUNITY): Payer: Medicare Other

## 2022-08-17 ENCOUNTER — Emergency Department (HOSPITAL_COMMUNITY): Payer: Medicare Other

## 2022-08-17 ENCOUNTER — Ambulatory Visit (HOSPITAL_BASED_OUTPATIENT_CLINIC_OR_DEPARTMENT_OTHER): Payer: Medicare Other | Admitting: Internal Medicine

## 2022-08-17 ENCOUNTER — Encounter (HOSPITAL_COMMUNITY): Payer: Self-pay

## 2022-08-17 ENCOUNTER — Other Ambulatory Visit: Payer: Self-pay

## 2022-08-17 DIAGNOSIS — S299XXA Unspecified injury of thorax, initial encounter: Secondary | ICD-10-CM | POA: Diagnosis not present

## 2022-08-17 DIAGNOSIS — I731 Thromboangiitis obliterans [Buerger's disease]: Secondary | ICD-10-CM | POA: Diagnosis present

## 2022-08-17 DIAGNOSIS — Z87891 Personal history of nicotine dependence: Secondary | ICD-10-CM | POA: Diagnosis not present

## 2022-08-17 DIAGNOSIS — I517 Cardiomegaly: Secondary | ICD-10-CM | POA: Diagnosis not present

## 2022-08-17 DIAGNOSIS — Z23 Encounter for immunization: Secondary | ICD-10-CM | POA: Diagnosis not present

## 2022-08-17 DIAGNOSIS — E1140 Type 2 diabetes mellitus with diabetic neuropathy, unspecified: Secondary | ICD-10-CM | POA: Diagnosis not present

## 2022-08-17 DIAGNOSIS — Z043 Encounter for examination and observation following other accident: Secondary | ICD-10-CM | POA: Diagnosis not present

## 2022-08-17 DIAGNOSIS — R911 Solitary pulmonary nodule: Secondary | ICD-10-CM

## 2022-08-17 DIAGNOSIS — G9340 Encephalopathy, unspecified: Secondary | ICD-10-CM | POA: Insufficient documentation

## 2022-08-17 DIAGNOSIS — R339 Retention of urine, unspecified: Secondary | ICD-10-CM | POA: Diagnosis not present

## 2022-08-17 DIAGNOSIS — R531 Weakness: Secondary | ICD-10-CM

## 2022-08-17 DIAGNOSIS — M79605 Pain in left leg: Secondary | ICD-10-CM | POA: Diagnosis not present

## 2022-08-17 DIAGNOSIS — T426X5A Adverse effect of other antiepileptic and sedative-hypnotic drugs, initial encounter: Secondary | ICD-10-CM | POA: Diagnosis present

## 2022-08-17 DIAGNOSIS — S0990XA Unspecified injury of head, initial encounter: Secondary | ICD-10-CM | POA: Diagnosis not present

## 2022-08-17 DIAGNOSIS — E782 Mixed hyperlipidemia: Secondary | ICD-10-CM | POA: Diagnosis not present

## 2022-08-17 DIAGNOSIS — Z7401 Bed confinement status: Secondary | ICD-10-CM | POA: Diagnosis not present

## 2022-08-17 DIAGNOSIS — G928 Other toxic encephalopathy: Secondary | ICD-10-CM | POA: Diagnosis not present

## 2022-08-17 DIAGNOSIS — Z95820 Peripheral vascular angioplasty status with implants and grafts: Secondary | ICD-10-CM | POA: Diagnosis not present

## 2022-08-17 DIAGNOSIS — E860 Dehydration: Secondary | ICD-10-CM | POA: Diagnosis not present

## 2022-08-17 DIAGNOSIS — Z96612 Presence of left artificial shoulder joint: Secondary | ICD-10-CM | POA: Diagnosis not present

## 2022-08-17 DIAGNOSIS — E8729 Other acidosis: Secondary | ICD-10-CM | POA: Diagnosis not present

## 2022-08-17 DIAGNOSIS — R109 Unspecified abdominal pain: Secondary | ICD-10-CM | POA: Diagnosis not present

## 2022-08-17 DIAGNOSIS — G9341 Metabolic encephalopathy: Secondary | ICD-10-CM

## 2022-08-17 DIAGNOSIS — F1010 Alcohol abuse, uncomplicated: Secondary | ICD-10-CM

## 2022-08-17 DIAGNOSIS — Z96652 Presence of left artificial knee joint: Secondary | ICD-10-CM | POA: Diagnosis not present

## 2022-08-17 DIAGNOSIS — M109 Gout, unspecified: Secondary | ICD-10-CM | POA: Diagnosis not present

## 2022-08-17 DIAGNOSIS — E1142 Type 2 diabetes mellitus with diabetic polyneuropathy: Secondary | ICD-10-CM | POA: Diagnosis not present

## 2022-08-17 DIAGNOSIS — I69811 Memory deficit following other cerebrovascular disease: Secondary | ICD-10-CM | POA: Diagnosis not present

## 2022-08-17 DIAGNOSIS — K5790 Diverticulosis of intestine, part unspecified, without perforation or abscess without bleeding: Secondary | ICD-10-CM | POA: Diagnosis not present

## 2022-08-17 DIAGNOSIS — R410 Disorientation, unspecified: Principal | ICD-10-CM

## 2022-08-17 DIAGNOSIS — I6523 Occlusion and stenosis of bilateral carotid arteries: Secondary | ICD-10-CM | POA: Diagnosis not present

## 2022-08-17 DIAGNOSIS — L89610 Pressure ulcer of right heel, unstageable: Secondary | ICD-10-CM | POA: Diagnosis present

## 2022-08-17 DIAGNOSIS — J439 Emphysema, unspecified: Secondary | ICD-10-CM | POA: Diagnosis not present

## 2022-08-17 DIAGNOSIS — E119 Type 2 diabetes mellitus without complications: Secondary | ICD-10-CM

## 2022-08-17 DIAGNOSIS — L97821 Non-pressure chronic ulcer of other part of left lower leg limited to breakdown of skin: Secondary | ICD-10-CM | POA: Diagnosis not present

## 2022-08-17 DIAGNOSIS — I6930 Unspecified sequelae of cerebral infarction: Secondary | ICD-10-CM | POA: Diagnosis not present

## 2022-08-17 DIAGNOSIS — N179 Acute kidney failure, unspecified: Secondary | ICD-10-CM

## 2022-08-17 DIAGNOSIS — I4821 Permanent atrial fibrillation: Secondary | ICD-10-CM | POA: Diagnosis present

## 2022-08-17 DIAGNOSIS — Z96641 Presence of right artificial hip joint: Secondary | ICD-10-CM | POA: Diagnosis not present

## 2022-08-17 DIAGNOSIS — Z96611 Presence of right artificial shoulder joint: Secondary | ICD-10-CM | POA: Diagnosis not present

## 2022-08-17 DIAGNOSIS — M6281 Muscle weakness (generalized): Secondary | ICD-10-CM | POA: Diagnosis not present

## 2022-08-17 DIAGNOSIS — L89150 Pressure ulcer of sacral region, unstageable: Secondary | ICD-10-CM | POA: Diagnosis present

## 2022-08-17 DIAGNOSIS — K7460 Unspecified cirrhosis of liver: Secondary | ICD-10-CM | POA: Diagnosis not present

## 2022-08-17 DIAGNOSIS — E1159 Type 2 diabetes mellitus with other circulatory complications: Secondary | ICD-10-CM | POA: Diagnosis not present

## 2022-08-17 DIAGNOSIS — S199XXA Unspecified injury of neck, initial encounter: Secondary | ICD-10-CM | POA: Diagnosis not present

## 2022-08-17 DIAGNOSIS — I10 Essential (primary) hypertension: Secondary | ICD-10-CM | POA: Diagnosis not present

## 2022-08-17 DIAGNOSIS — R404 Transient alteration of awareness: Secondary | ICD-10-CM | POA: Diagnosis not present

## 2022-08-17 DIAGNOSIS — I872 Venous insufficiency (chronic) (peripheral): Secondary | ICD-10-CM | POA: Diagnosis not present

## 2022-08-17 DIAGNOSIS — I482 Chronic atrial fibrillation, unspecified: Secondary | ICD-10-CM | POA: Diagnosis present

## 2022-08-17 DIAGNOSIS — K449 Diaphragmatic hernia without obstruction or gangrene: Secondary | ICD-10-CM | POA: Diagnosis not present

## 2022-08-17 DIAGNOSIS — M150 Primary generalized (osteo)arthritis: Secondary | ICD-10-CM | POA: Diagnosis not present

## 2022-08-17 DIAGNOSIS — H547 Unspecified visual loss: Secondary | ICD-10-CM | POA: Diagnosis present

## 2022-08-17 DIAGNOSIS — Z8673 Personal history of transient ischemic attack (TIA), and cerebral infarction without residual deficits: Secondary | ICD-10-CM | POA: Diagnosis not present

## 2022-08-17 DIAGNOSIS — I4819 Other persistent atrial fibrillation: Secondary | ICD-10-CM | POA: Diagnosis not present

## 2022-08-17 DIAGNOSIS — R0989 Other specified symptoms and signs involving the circulatory and respiratory systems: Secondary | ICD-10-CM | POA: Diagnosis not present

## 2022-08-17 DIAGNOSIS — I110 Hypertensive heart disease with heart failure: Secondary | ICD-10-CM | POA: Diagnosis present

## 2022-08-17 DIAGNOSIS — Z7984 Long term (current) use of oral hypoglycemic drugs: Secondary | ICD-10-CM | POA: Diagnosis not present

## 2022-08-17 DIAGNOSIS — D649 Anemia, unspecified: Secondary | ICD-10-CM | POA: Diagnosis not present

## 2022-08-17 DIAGNOSIS — I700 Atherosclerosis of aorta: Secondary | ICD-10-CM | POA: Diagnosis not present

## 2022-08-17 DIAGNOSIS — I70223 Atherosclerosis of native arteries of extremities with rest pain, bilateral legs: Secondary | ICD-10-CM | POA: Diagnosis not present

## 2022-08-17 DIAGNOSIS — Z91040 Latex allergy status: Secondary | ICD-10-CM | POA: Diagnosis not present

## 2022-08-17 DIAGNOSIS — Z886 Allergy status to analgesic agent status: Secondary | ICD-10-CM | POA: Diagnosis not present

## 2022-08-17 DIAGNOSIS — E872 Acidosis, unspecified: Secondary | ICD-10-CM

## 2022-08-17 DIAGNOSIS — M25562 Pain in left knee: Secondary | ICD-10-CM | POA: Diagnosis not present

## 2022-08-17 DIAGNOSIS — L89620 Pressure ulcer of left heel, unstageable: Secondary | ICD-10-CM | POA: Diagnosis present

## 2022-08-17 DIAGNOSIS — I5032 Chronic diastolic (congestive) heart failure: Secondary | ICD-10-CM | POA: Diagnosis present

## 2022-08-17 DIAGNOSIS — Z888 Allergy status to other drugs, medicaments and biological substances status: Secondary | ICD-10-CM | POA: Diagnosis not present

## 2022-08-17 LAB — CBC
HCT: 42.3 % (ref 39.0–52.0)
Hemoglobin: 14.2 g/dL (ref 13.0–17.0)
MCH: 30.9 pg (ref 26.0–34.0)
MCHC: 33.6 g/dL (ref 30.0–36.0)
MCV: 92 fL (ref 80.0–100.0)
Platelets: 197 10*3/uL (ref 150–400)
RBC: 4.6 MIL/uL (ref 4.22–5.81)
RDW: 14.5 % (ref 11.5–15.5)
WBC: 7.1 10*3/uL (ref 4.0–10.5)
nRBC: 0 % (ref 0.0–0.2)

## 2022-08-17 LAB — BASIC METABOLIC PANEL
Anion gap: 14 (ref 5–15)
BUN: 33 mg/dL — ABNORMAL HIGH (ref 8–23)
CO2: 20 mmol/L — ABNORMAL LOW (ref 22–32)
Calcium: 9.2 mg/dL (ref 8.9–10.3)
Chloride: 105 mmol/L (ref 98–111)
Creatinine, Ser: 0.81 mg/dL (ref 0.61–1.24)
GFR, Estimated: >60 mL/min (ref >60)
Glucose, Bld: 133 mg/dL — ABNORMAL HIGH (ref 70–99)
Potassium: 3.8 mmol/L (ref 3.5–5.1)
Sodium: 139 mmol/L (ref 135–145)

## 2022-08-17 LAB — CBC WITH DIFFERENTIAL/PLATELET
Abs Immature Granulocytes: 0.03 10*3/uL (ref 0.00–0.07)
Basophils Absolute: 0 10*3/uL (ref 0.0–0.1)
Basophils Relative: 0 %
Eosinophils Absolute: 0 10*3/uL (ref 0.0–0.5)
Eosinophils Relative: 0 %
HCT: 44.6 % (ref 39.0–52.0)
Hemoglobin: 14.5 g/dL (ref 13.0–17.0)
Immature Granulocytes: 1 %
Lymphocytes Relative: 14 %
Lymphs Abs: 0.9 10*3/uL (ref 0.7–4.0)
MCH: 30.7 pg (ref 26.0–34.0)
MCHC: 32.5 g/dL (ref 30.0–36.0)
MCV: 94.3 fL (ref 80.0–100.0)
Monocytes Absolute: 0.6 10*3/uL (ref 0.1–1.0)
Monocytes Relative: 9 %
Neutro Abs: 5.1 10*3/uL (ref 1.7–7.7)
Neutrophils Relative %: 76 %
Platelets: 179 10*3/uL (ref 150–400)
RBC: 4.73 MIL/uL (ref 4.22–5.81)
RDW: 14.4 % (ref 11.5–15.5)
WBC: 6.6 10*3/uL (ref 4.0–10.5)
nRBC: 0 % (ref 0.0–0.2)

## 2022-08-17 LAB — I-STAT CHEM 8, ED
BUN: 38 mg/dL — ABNORMAL HIGH (ref 8–23)
Calcium, Ion: 1.19 mmol/L (ref 1.15–1.40)
Chloride: 107 mmol/L (ref 98–111)
Creatinine, Ser: 0.8 mg/dL (ref 0.61–1.24)
Glucose, Bld: 133 mg/dL — ABNORMAL HIGH (ref 70–99)
HCT: 43 % (ref 39.0–52.0)
Hemoglobin: 14.6 g/dL (ref 13.0–17.0)
Potassium: 4.1 mmol/L (ref 3.5–5.1)
Sodium: 139 mmol/L (ref 135–145)
TCO2: 21 mmol/L — ABNORMAL LOW (ref 22–32)

## 2022-08-17 LAB — URINALYSIS, W/ REFLEX TO CULTURE (INFECTION SUSPECTED)
Bacteria, UA: NONE SEEN
Bilirubin Urine: NEGATIVE
Glucose, UA: NEGATIVE mg/dL
Hgb urine dipstick: NEGATIVE
Ketones, ur: 5 mg/dL — AB
Leukocytes,Ua: NEGATIVE
Nitrite: NEGATIVE
Protein, ur: 100 mg/dL — AB
Specific Gravity, Urine: 1.03 (ref 1.005–1.030)
pH: 5 (ref 5.0–8.0)

## 2022-08-17 LAB — RAPID URINE DRUG SCREEN, HOSP PERFORMED
Amphetamines: NOT DETECTED
Barbiturates: NOT DETECTED
Benzodiazepines: NOT DETECTED
Cocaine: NOT DETECTED
Opiates: NOT DETECTED
Tetrahydrocannabinol: NOT DETECTED

## 2022-08-17 LAB — I-STAT CG4 LACTIC ACID, ED
Lactic Acid, Venous: 2.4 mmol/L (ref 0.5–1.9)
Lactic Acid, Venous: 1.4 mmol/L (ref 0.5–1.9)

## 2022-08-17 LAB — AMMONIA: Ammonia: 14 umol/L (ref 9–35)

## 2022-08-17 LAB — CULTURE, BLOOD (ROUTINE X 2)
Special Requests: ADEQUATE
Special Requests: ADEQUATE

## 2022-08-17 LAB — PROTIME-INR
INR: 1.1 (ref 0.8–1.2)
Prothrombin Time: 14.2 seconds (ref 11.4–15.2)

## 2022-08-17 LAB — VITAMIN B12: Vitamin B-12: 1337 pg/mL — ABNORMAL HIGH (ref 180–914)

## 2022-08-17 LAB — GLUCOSE, CAPILLARY
Glucose-Capillary: 114 mg/dL — ABNORMAL HIGH (ref 70–99)
Glucose-Capillary: 203 mg/dL — ABNORMAL HIGH (ref 70–99)
Glucose-Capillary: 92 mg/dL (ref 70–99)
Glucose-Capillary: 153 mg/dL — ABNORMAL HIGH (ref 70–99)

## 2022-08-17 LAB — COMPREHENSIVE METABOLIC PANEL
ALT: 34 U/L (ref 0–44)
AST: 47 U/L — ABNORMAL HIGH (ref 15–41)
Albumin: 4.1 g/dL (ref 3.5–5.0)
Alkaline Phosphatase: 77 U/L (ref 38–126)
Anion gap: 16 — ABNORMAL HIGH (ref 5–15)
BUN: 32 mg/dL — ABNORMAL HIGH (ref 8–23)
CO2: 18 mmol/L — ABNORMAL LOW (ref 22–32)
Calcium: 9.5 mg/dL (ref 8.9–10.3)
Chloride: 104 mmol/L (ref 98–111)
Creatinine, Ser: 0.95 mg/dL (ref 0.61–1.24)
GFR, Estimated: >60 mL/min (ref >60)
Glucose, Bld: 135 mg/dL — ABNORMAL HIGH (ref 70–99)
Potassium: 3.8 mmol/L (ref 3.5–5.1)
Sodium: 138 mmol/L (ref 135–145)
Total Bilirubin: 1.2 mg/dL (ref 0.3–1.2)
Total Protein: 7.5 g/dL (ref 6.5–8.1)

## 2022-08-17 LAB — TSH: TSH: 2.561 u[IU]/mL (ref 0.350–4.500)

## 2022-08-17 LAB — C-REACTIVE PROTEIN: CRP: 0.5 mg/dL (ref <1.0)

## 2022-08-17 LAB — APTT: aPTT: 25 seconds (ref 24–36)

## 2022-08-17 LAB — ETHANOL: Alcohol, Ethyl (B): <10 mg/dL (ref <10)

## 2022-08-17 LAB — HEMOGLOBIN A1C
Hgb A1c MFr Bld: 6.4 % — ABNORMAL HIGH (ref 4.8–5.6)
Mean Plasma Glucose: 137 mg/dL

## 2022-08-17 LAB — CK: Total CK: 147 U/L (ref 49–397)

## 2022-08-17 LAB — CREATININE, URINE, RANDOM: Creatinine, Urine: 148 mg/dL

## 2022-08-17 LAB — SODIUM, URINE, RANDOM: Sodium, Ur: 46 mmol/L

## 2022-08-17 LAB — MAGNESIUM: Magnesium: 1.9 mg/dL (ref 1.7–2.4)

## 2022-08-17 LAB — SEDIMENTATION RATE: Sed Rate: 13 mm/hr (ref 0–16)

## 2022-08-17 MED ORDER — INSULIN ASPART 100 UNIT/ML IJ SOLN
0.0000 [IU] | Freq: Three times a day (TID) | INTRAMUSCULAR | Status: DC
  Filled 2022-08-17: qty 0.09

## 2022-08-17 MED ORDER — ACETAMINOPHEN 650 MG RE SUPP: 650.0000 mg | Freq: Four times a day (QID) | RECTAL | Status: DC | PRN

## 2022-08-17 MED ORDER — THIAMINE HCL 100 MG/ML IJ SOLN
100.0000 mg | INTRAMUSCULAR | Status: DC
  Administered 2022-08-17 – 2022-08-19 (×3): 100 mg via INTRAVENOUS
  Filled 2022-08-17 (×3): qty 2

## 2022-08-17 MED ORDER — IRBESARTAN 150 MG PO TABS
75.0000 mg | ORAL_TABLET | Freq: Every day | ORAL | Status: DC
  Administered 2022-08-17: 75 mg via ORAL
  Filled 2022-08-17: qty 1

## 2022-08-17 MED ORDER — SODIUM CHLORIDE 0.9 % IV SOLN: INTRAVENOUS | Status: DC

## 2022-08-17 MED ORDER — HYDRALAZINE HCL 20 MG/ML IJ SOLN
10.0000 mg | Freq: Once | INTRAMUSCULAR | Status: AC | PRN
  Administered 2022-08-17: 10 mg via INTRAVENOUS
  Filled 2022-08-17: qty 1

## 2022-08-17 MED ORDER — ATENOLOL 50 MG PO TABS
50.0000 mg | ORAL_TABLET | Freq: Every day | ORAL | Status: DC
  Administered 2022-08-17 – 2022-08-19 (×3): 50 mg via ORAL
  Filled 2022-08-17 (×3): qty 1

## 2022-08-17 MED ORDER — IOHEXOL 300 MG/ML  SOLN
100.0000 mL | Freq: Once | INTRAMUSCULAR | Status: AC | PRN
  Administered 2022-08-17: 100 mL via INTRAVENOUS

## 2022-08-17 MED ORDER — CLOPIDOGREL BISULFATE 75 MG PO TABS
75.0000 mg | ORAL_TABLET | Freq: Every day | ORAL | Status: DC
  Administered 2022-08-18 – 2022-08-19 (×2): 75 mg via ORAL
  Filled 2022-08-17 (×3): qty 1

## 2022-08-17 MED ORDER — FLUTICASONE PROPIONATE 50 MCG/ACT NA SUSP
2.0000 | Freq: Every day | NASAL | Status: DC
  Administered 2022-08-19: 2 via NASAL
  Filled 2022-08-17 (×2): qty 16

## 2022-08-17 MED ORDER — MELATONIN 3 MG PO TABS
3.0000 mg | ORAL_TABLET | Freq: Every evening | ORAL | Status: DC | PRN
  Administered 2022-08-17 – 2022-08-18 (×2): 3 mg via ORAL
  Filled 2022-08-17 (×2): qty 1

## 2022-08-17 MED ORDER — PROSIGHT PO TABS
1.0000 | ORAL_TABLET | Freq: Every day | ORAL | Status: DC
  Administered 2022-08-17 – 2022-08-19 (×3): 1 via ORAL
  Filled 2022-08-17 (×3): qty 1

## 2022-08-17 MED ORDER — SODIUM CHLORIDE 0.9 % IV BOLUS (SEPSIS)
1000.0000 mL | Freq: Once | INTRAVENOUS | Status: AC
  Administered 2022-08-17: 1000 mL via INTRAVENOUS

## 2022-08-17 MED ORDER — LACTULOSE 10 GM/15ML PO SOLN
10.0000 g | Freq: Two times a day (BID) | ORAL | Status: AC
  Administered 2022-08-17 (×2): 10 g via ORAL
  Filled 2022-08-17 (×2): qty 30

## 2022-08-17 MED ORDER — HYDRALAZINE HCL 25 MG PO TABS
25.0000 mg | ORAL_TABLET | Freq: Three times a day (TID) | ORAL | Status: DC
  Administered 2022-08-17 – 2022-08-19 (×5): 25 mg via ORAL
  Filled 2022-08-17 (×5): qty 1

## 2022-08-17 MED ORDER — INSULIN ASPART 100 UNIT/ML IJ SOLN
4.0000 [IU] | Freq: Three times a day (TID) | INTRAMUSCULAR | Status: DC
  Administered 2022-08-17 – 2022-08-18 (×2): 4 [IU] via SUBCUTANEOUS

## 2022-08-17 MED ORDER — ONDANSETRON HCL 4 MG/2ML IJ SOLN: 4.0000 mg | Freq: Four times a day (QID) | INTRAMUSCULAR | Status: DC | PRN

## 2022-08-17 MED ORDER — INSULIN ASPART 100 UNIT/ML IJ SOLN: 0.0000 [IU] | Freq: Every day | INTRAMUSCULAR | Status: DC

## 2022-08-17 MED ORDER — FOLIC ACID 1 MG PO TABS
1.0000 mg | ORAL_TABLET | Freq: Every day | ORAL | Status: DC
  Administered 2022-08-17 – 2022-08-19 (×3): 1 mg via ORAL
  Filled 2022-08-17 (×3): qty 1

## 2022-08-17 MED ORDER — ASPIRIN 81 MG PO TBEC
81.0000 mg | DELAYED_RELEASE_TABLET | ORAL | Status: DC
  Administered 2022-08-19: 81 mg via ORAL
  Filled 2022-08-17 (×2): qty 1

## 2022-08-17 MED ORDER — ROSUVASTATIN CALCIUM 20 MG PO TABS
20.0000 mg | ORAL_TABLET | Freq: Every day | ORAL | Status: DC
  Administered 2022-08-18 – 2022-08-19 (×2): 20 mg via ORAL
  Filled 2022-08-17 (×2): qty 1

## 2022-08-17 MED ORDER — ACETAMINOPHEN 325 MG PO TABS: 650.0000 mg | ORAL_TABLET | Freq: Four times a day (QID) | ORAL | Status: DC | PRN

## 2022-08-17 MED ORDER — INSULIN ASPART 100 UNIT/ML IJ SOLN
0.0000 [IU] | Freq: Three times a day (TID) | INTRAMUSCULAR | Status: DC
  Administered 2022-08-17: 5 [IU] via SUBCUTANEOUS
  Administered 2022-08-18: 2 [IU] via SUBCUTANEOUS
  Administered 2022-08-18: 3 [IU] via SUBCUTANEOUS

## 2022-08-17 MED ORDER — LACTATED RINGERS IV SOLN: INTRAVENOUS | Status: DC

## 2022-08-17 NOTE — Progress Notes (Signed)
[facility] PROGRESS NOTE    Progress Note  [name]  ZOX:[id number] DOB: 05-26-1935 DOA: 08/17/2022 PCP: Darrow Bussing, MD     Brief Narrative:   [name] is an 87 y.o. male past medical history significant for chronic atrial fibrillation not on anticoagulation, alcohol abuse, type 2 diabetes mellitus essential hypertension, chronic left lower extremity wound admitted to [facility] for acute metabolic encephalopathy, the daughter relates that the patient has been having generalized weakness and the episodes of acute focal weaknesses she has remained afebrile UA shows no signs of infection with elevated specific gravity, no leukocytosis afebrile    Assessment/Plan:   Acute metabolic encephalopathy: Probably due to polypharmacy in the setting of dehydration.  There are no signs of infection. She has remained afebrile no leukocytosis imaging showed no acute findings. Cultures have been collected agree with holding antibiotics. Hold Lyrica, ACE inhibitor and Lasix. Ammonia level pending B12 pending CK none ESR and CRP TSH 2.5  Generalized weakness: Started on IV fluids, holding Lyrica. PT OT has been consulted.  Dehydration: BUN elevated lactic acid mildly elevated on admission, along with a elevated specific gravity. Was started on IV fluid holding Lasix. She is not acute kidney injury, continue IV fluids.  High anion gap metabolic acidosis: Likely due to lactic acid she was started on IV fluids.  Left-sided pulmonary nodule: Follow-up as an outpatient.  Chronic atrial fibrillation: With a chads Vascor of at least 5, not a candidate for anticoagulation due to recurrent falls. Continue Tylenol.  Chronic alcohol abuse: Seizure precaution started on thiamine and folate. Ammonia level is pending, started on lactulose empirically.  Diabetes mellitus type 2: Hold all oral hypoglycemic agents. A1c is pending. Continue sliding scale.  Essential  hypertension: Blood pressure is significantly elevated hold ACE inhibitor and Lasix. Continue atenolol.  Pulmonary nodule Follow-up with PCP as an outpatient.  Unstageable sacral decubitus ulcer present on admission RN Pressure Injury Documentation: Pressure Ulcer 07/21/14 Unstageable - Full thickness tissue loss in which the base of the ulcer is covered by slough (yellow, tan, gray, green or brown) and/or eschar (tan, brown or black) in the wound bed. 4 x 2.5 (Active)  07/21/14   Location: Heel  Location Orientation: Right  Staging: Unstageable - Full thickness tissue loss in which the base of the ulcer is covered by slough (yellow, tan, gray, green or brown) and/or eschar (tan, brown or black) in the wound bed.  Wound Description (Comments): 4 x 2.5  Present on Admission: Yes     Pressure Ulcer 07/21/14 Unstageable - Full thickness tissue loss in which the base of the ulcer is covered by slough (yellow, tan, gray, green or brown) and/or eschar (tan, brown or black) in the wound bed. 4x 2.5 (Active)  07/21/14 2135  Location: Heel  Location Orientation: Left  Staging: Unstageable - Full thickness tissue loss in which the base of the ulcer is covered by slough (yellow, tan, gray, green or brown) and/or eschar (tan, brown or black) in the wound bed.  Wound Description (Comments): 4x 2.5  Present on Admission: Yes    DVT prophylaxis: lovenox Family Communication: I was unsuccessful calling daughter Status is: Inpatient Remains inpatient appropriate because: Acute metabolic encephalopathy    Code Status:     Code Status Orders  (From admission, onward)           Start     Ordered   08/17/22 0512  Full code  Continuous  Question:  By:  Answer:  Consent: discussion documented in EHR   08/17/22 0511           Code Status History     Date Active Date Inactive Code Status Order ID Comments User Context   03/18/2022 1436 03/18/2022 2135 Full Code [id number]  Victorino Sparrow, MD Inpatient   10/09/2021 1351 10/12/2021 1845 Full Code [id number]  Dory Horn Inpatient   09/24/2021 1342 09/24/2021 2040 Full Code [id number]  Victorino Sparrow, MD Inpatient   07/21/2014 2104 07/24/2014 1453 Full Code [id number]  Therisa Doyne, MD Inpatient   05/16/2014 2000 05/24/2014 1447 Full Code [id number]  Gilmer Mor, DO Inpatient   05/04/2014 2357 05/16/2014 2000 Full Code [id number]  Eduard Clos, MD Inpatient         IV Access:   Peripheral IV   Procedures and diagnostic studies:   CT Head Wo Contrast  Result Date: 08/17/2022 CLINICAL DATA:  Head trauma, minor (Age >= 65y); Neck trauma (Age >= 65y). Fall EXAM: CT HEAD WITHOUT CONTRAST CT CERVICAL SPINE WITHOUT CONTRAST TECHNIQUE: Multidetector CT imaging of the head and cervical spine was performed following the standard protocol without intravenous contrast. Multiplanar CT image reconstructions of the cervical spine were also generated. RADIATION DOSE REDUCTION: This exam was performed according to the departmental dose-optimization program which includes automated exposure control, adjustment of the mA and/or kV according to patient size and/or use of iterative reconstruction technique. COMPARISON:  CT head 07/21/2014 FINDINGS: CT HEAD FINDINGS Brain: Cerebral ventricle sizes are concordant with the degree of cerebral volume loss. Patchy and confluent areas of decreased attenuation are noted throughout the deep and periventricular white matter of the cerebral hemispheres bilaterally, compatible with chronic microvascular ischemic disease. No evidence of large-territorial acute infarction. No parenchymal hemorrhage. No mass lesion. No extra-axial collection. No mass effect or midline shift. No hydrocephalus. Basilar cisterns are patent. Vascular: No hyperdense vessel. Atherosclerotic calcifications are present within the cavernous internal carotid arteries. Skull: No acute fracture or focal lesion.  Sinuses/Orbits: Paranasal sinuses and mastoid air cells are clear. The orbits are unremarkable. Other: None. CT CERVICAL SPINE FINDINGS Alignment: Grade 1 anterolisthesis of C2 on C3. Skull base and vertebrae: Multilevel moderate degenerative changes of the spine. No associated severe osseous neural foraminal or central canal stenosis. No acute fracture. No aggressive appearing focal osseous lesion or focal pathologic process. Soft tissues and spinal canal: No prevertebral fluid or swelling. No visible canal hematoma. Upper chest: Emphysematous change. Other: Atherosclerotic plaque of the carotid arteries within the neck. Patient is edentulous. IMPRESSION: 1. No acute intracranial abnormality. 2. No acute displaced fracture or traumatic listhesis of the cervical spine. Electronically Signed   By: Tish Frederickson M.D.   On: 08/17/2022 03:11   CT Cervical Spine Wo Contrast  Result Date: 08/17/2022 CLINICAL DATA:  Head trauma, minor (Age >= 65y); Neck trauma (Age >= 65y). Fall EXAM: CT HEAD WITHOUT CONTRAST CT CERVICAL SPINE WITHOUT CONTRAST TECHNIQUE: Multidetector CT imaging of the head and cervical spine was performed following the standard protocol without intravenous contrast. Multiplanar CT image reconstructions of the cervical spine were also generated. RADIATION DOSE REDUCTION: This exam was performed according to the departmental dose-optimization program which includes automated exposure control, adjustment of the mA and/or kV according to patient size and/or use of iterative reconstruction technique. COMPARISON:  CT head 07/21/2014 FINDINGS: CT HEAD FINDINGS Brain: Cerebral ventricle sizes are concordant with the degree of cerebral volume loss. Patchy and confluent areas of decreased  attenuation are noted throughout the deep and periventricular white matter of the cerebral hemispheres bilaterally, compatible with chronic microvascular ischemic disease. No evidence of large-territorial acute infarction.  No parenchymal hemorrhage. No mass lesion. No extra-axial collection. No mass effect or midline shift. No hydrocephalus. Basilar cisterns are patent. Vascular: No hyperdense vessel. Atherosclerotic calcifications are present within the cavernous internal carotid arteries. Skull: No acute fracture or focal lesion. Sinuses/Orbits: Paranasal sinuses and mastoid air cells are clear. The orbits are unremarkable. Other: None. CT CERVICAL SPINE FINDINGS Alignment: Grade 1 anterolisthesis of C2 on C3. Skull base and vertebrae: Multilevel moderate degenerative changes of the spine. No associated severe osseous neural foraminal or central canal stenosis. No acute fracture. No aggressive appearing focal osseous lesion or focal pathologic process. Soft tissues and spinal canal: No prevertebral fluid or swelling. No visible canal hematoma. Upper chest: Emphysematous change. Other: Atherosclerotic plaque of the carotid arteries within the neck. Patient is edentulous. IMPRESSION: 1. No acute intracranial abnormality. 2. No acute displaced fracture or traumatic listhesis of the cervical spine. Electronically Signed   By: Tish Frederickson M.D.   On: 08/17/2022 03:11   CT CHEST ABDOMEN PELVIS W CONTRAST  Result Date: 08/17/2022 CLINICAL DATA:  Polytrauma, blunt fall, abdominal pain, AMS, possible sepsis EXAM: CT CHEST, ABDOMEN, AND PELVIS WITH CONTRAST TECHNIQUE: Multidetector CT imaging of the chest, abdomen and pelvis was performed following the standard protocol during bolus administration of intravenous contrast. RADIATION DOSE REDUCTION: This exam was performed according to the departmental dose-optimization program which includes automated exposure control, adjustment of the mA and/or kV according to patient size and/or use of iterative reconstruction technique. CONTRAST:  OMNIPAQUE IOHEXOL 300 MG/ML  SOLN COMPARISON:  Chest x-ray 08/17/2022, x-ray pelvis 08/17/2022 FINDINGS: CHEST: Cardiovascular: No aortic injury.  The thoracic aorta is normal in caliber. The heart is enlarged in size. No significant pericardial effusion. Severe atherosclerotic plaque. Four-vessel coronary calcification. Aortic valve leaflet calcification. Mediastinum/Nodes: No pneumomediastinum. No mediastinal hematoma. The esophagus is unremarkable.  Small hiatal hernia. The thyroid is unremarkable. The central airways are patent. No mediastinal, hilar, or axillary lymphadenopathy. Lungs/Pleura: Mild paraseptal emphysematous changes. Bilateral lower lobe atelectasis. Diffuse mild bronchial wall thickening. No focal consolidation. Subpleural left lower lobe 9 x 7 mm pulmonary nodule. Adjacent 4 mm pulmonary nodule. No pulmonary mass. No pulmonary contusion or laceration. No pneumatocele formation. No pleural effusion. No pneumothorax. No hemothorax. Musculoskeletal/Chest wall: No chest wall mass. No acute rib or sternal fracture. No spinal fracture. Chronic T3 and T4 superior endplate compression fractures with associated Schmorl nodes. Bilateral total shoulder arthroplasties. ABDOMEN / PELVIS: Hepatobiliary: Not enlarged. Nodular hepatic contour. Diffusely hypodense hepatic parenchyma compared to the spleen. No focal lesion. No laceration or subcapsular hematoma. The gallbladder is otherwise unremarkable with no radio-opaque gallstones. No biliary ductal dilatation. Pancreas: Normal pancreatic contour. No main pancreatic duct dilatation. Spleen: Not enlarged. No focal lesion. No laceration, subcapsular hematoma, or vascular injury. Adrenals/Urinary Tract: No nodularity bilaterally. Bilateral kidneys enhance symmetrically. Bilateral renal cortical scarring. Subcentimeter hypodensities are too small to characterize-no further follow-up indicated. No hydronephrosis. No contusion, laceration, or subcapsular hematoma. No injury to the vascular structures or collecting systems. No hydroureter. The urinary bladder is unremarkable. Stomach/Bowel: No small or large  bowel wall thickening or dilatation. Colonic diverticulosis. 8 cm rectal stool ball. The appendix is unremarkable. Vasculature/Lymphatics: Atherosclerotic plaque. The portal, splenic, superior mesenteric veins are patent. No abdominal aorta or iliac aneurysm. No active contrast extravasation or pseudoaneurysm. No abdominal, pelvic, inguinal lymphadenopathy. Reproductive:  Normal. Other: No simple free fluid ascites. No pneumoperitoneum. No hemoperitoneum. No mesenteric hematoma identified. No organized fluid collection. Musculoskeletal: No significant soft tissue hematoma. Small fat containing umbilical hernia. No acute pelvic fracture. No spinal fracture. Multilevel severe degenerative changes spine with multilevel severe intervertebral disc space narrowing and vacuum phenomenon. Disc bulge at the L3 and L4 and L4-L5 levels. Grade 1 anterolisthesis of L3 on L4 and L4 on L5 (total of 6 lumbar bodies). Total right hip arthroplasty partially visualized. Plate and screw fixation of the proximal femur partially visualized. Ports and Devices: None. IMPRESSION: 1. No acute intrathoracic, intra-abdominal, intrapelvic traumatic injury. 2. No acute fracture or traumatic malalignment of the thoracic or lumbar spine. Other imaging findings of potential clinical significance: 1. Aortic Atherosclerosis (ICD10-I70.0) and Emphysema (ICD10-J43.9). 2. Multiple pulmonary nodules. Most significant: Left solid pulmonary nodule measuring 8 mm.Per Fleischner Society Guidelines, recommend a non-contrast Chest CT at 3-6 months, then another non-contrast Chest CT at 18-24 months. These guidelines do not apply to immunocompromised patients and patients with cancer. Follow up in patients with significant comorbidities as clinically warranted. For lung cancer screening, adhere to Lung-RADS guidelines. Reference: Radiology. 2017; 284(1):228-43. 3. Cardiomegaly. 4. Small hiatal hernia. 5. Cirrhosis with no definite finding of portal hypertension.  No focal liver lesions identified. Please note that liver protocol enhanced MR and CT are the most sensitive tests for the screening detection of hepatocellular carcinoma in the high risk setting of cirrhosis. 6. Colonic diverticulosis with no acute diverticulitis. 7. An 8 cm rectal stool ball. Electronically Signed   By: Tish Frederickson M.D.   On: 08/17/2022 03:08   DG HIPS BILAT WITH PELVIS 3-4 VIEWS  Result Date: 08/17/2022 CLINICAL DATA:  [id number] Fall [id number] EXAM: DG HIP (WITH OR WITHOUT PELVIS) 3-4V BILAT COMPARISON:  CT abdomen pelvis 05/21/2014 FINDINGS: Total right hip arthroplasty. Plate and screw fixation of the proximal left femur partially visualized. No radiographic findings suggest surgical hardware complication. There is no evidence of hip fracture or dislocation of both hips. No acute displaced fracture or diastasis of the bones of the pelvis. There is no evidence of arthropathy or other focal bone abnormality. Vascular calcification. IMPRESSION: Negative for acute traumatic injury. Electronically Signed   By: Tish Frederickson M.D.   On: 08/17/2022 01:24   DG Chest Port 1 View  Result Date: 08/17/2022 CLINICAL DATA:  Questionable sepsis - evaluate for abnormality EXAM: PORTABLE CHEST 1 VIEW COMPARISON:  Chest x-ray 07/22/2014, CT chest 05/11/2014 FINDINGS: The heart and mediastinal contours are within normal limits. Atherosclerotic plaque. Low lung volumes. No focal consolidation. No pulmonary edema. No pleural effusion. No pneumothorax. No acute osseous abnormality. Bilateral total shoulder arthroplasty. IMPRESSION: 1. Low lung volumes with no active disease. 2.  Aortic Atherosclerosis (ICD10-I70.0). Electronically Signed   By: Tish Frederickson M.D.   On: 08/17/2022 01:15     Medical Consultants:   None.   Subjective:    [name] no complaints this morning he relates he is thirsty.  Objective:    Vitals:   08/17/22 0014 08/17/22 0304 08/17/22 0453 08/17/22 0628  BP:  (!)  141/93  (!) 162/118  Pulse:  100  90  Resp:  18  17  Temp:   98.4 F (36.9 C) 98.9 F (37.2 C)  TempSrc:   Oral Oral  SpO2:  100%  100%  Weight: 81.7 kg     Height: 5\' 8"  (1.727 m)      SpO2: 100 %  No intake or output  data in the 24 hours ending 08/17/22 0652 Filed Weights   08/17/22 0014  Weight: 81.7 kg    Exam: General exam: In no acute distress. Respiratory system: Good air movement and clear to auscultation. Cardiovascular system: S1 & S2 heard, RRR. No JVD. Gastrointestinal system: Abdomen is nondistended, soft and nontender.  Extremities: No pedal edema. Skin: No rashes, lesions or ulcers Psychiatry: Judgement and insight appear normal. Mood & affect appropriate.    Data Reviewed:    Labs: Basic Metabolic Panel: Recent Labs  Lab 08/17/22 0024 08/17/22 0143 08/17/22 0356  NA 138 139  --   K 3.8 4.1  --   CL 104 107  --   CO2 18*  --   --   GLUCOSE 135* 133*  --   BUN 32* 38*  --   CREATININE 0.95 0.80  --   CALCIUM 9.5  --   --   MG  --   --  1.9   GFR Estimated Creatinine Clearance: 65.3 mL/min (by C-G formula based on SCr of 0.8 mg/dL). Liver Function Tests: Recent Labs  Lab 08/17/22 0024  AST 47*  ALT 34  ALKPHOS 77  BILITOT 1.2  PROT 7.5  ALBUMIN 4.1   No results for input(s): "LIPASE", "AMYLASE" in the last 168 hours. No results for input(s): "AMMONIA" in the last 168 hours. Coagulation profile Recent Labs  Lab 08/17/22 0024  INR 1.1   COVID-19 Labs  No results for input(s): "DDIMER", "FERRITIN", "LDH", "CRP" in the last 72 hours.  No results found for: "SARSCOV2NAA"  CBC: Recent Labs  Lab 08/17/22 0024 08/17/22 0143  WBC 6.6  --   NEUTROABS 5.1  --   HGB 14.5 14.6  HCT 44.6 43.0  MCV 94.3  --   PLT 179  --    Cardiac Enzymes: No results for input(s): "CKTOTAL", "CKMB", "CKMBINDEX", "TROPONINI" in the last 168 hours. BNP (last 3 results) No results for input(s): "PROBNP" in the last 8760 hours. CBG: No results  for input(s): "GLUCAP" in the last 168 hours. D-Dimer: No results for input(s): "DDIMER" in the last 72 hours. Hgb A1c: No results for input(s): "HGBA1C" in the last 72 hours. Lipid Profile: No results for input(s): "CHOL", "HDL", "LDLCALC", "TRIG", "CHOLHDL", "LDLDIRECT" in the last 72 hours. Thyroid function studies: No results for input(s): "TSH", "T4TOTAL", "T3FREE", "THYROIDAB" in the last 72 hours.  Invalid input(s): "FREET3" Anemia work up: No results for input(s): "VITAMINB12", "FOLATE", "FERRITIN", "TIBC", "IRON", "RETICCTPCT" in the last 72 hours. Sepsis Labs: Recent Labs  Lab 08/17/22 0024 08/17/22 0129 08/17/22 0408  WBC 6.6  --   --   LATICACIDVEN  --  2.4* 1.4   Microbiology Recent Results (from the past 240 hour(s))  Blood Culture (routine x 2)     Status: None (Preliminary result)   Collection Time: 08/17/22  1:16 AM   Specimen: BLOOD RIGHT HAND  Result Value Ref Range Status   Specimen Description   Final    BLOOD RIGHT HAND Performed at [facility], [address], [place], [place] [address]    Special Requests   Final    BOTTLES DRAWN AEROBIC ONLY Blood Culture adequate volume Performed at [facility], [address], [place], [place] [address]    Culture PENDING  Incomplete   Report Status PENDING  Incomplete  Blood Culture (routine x 2)     Status: None (Preliminary result)   Collection Time: 08/17/22  1:20 AM   Specimen:  BLOOD LEFT FOREARM  Result Value Ref Range Status   Specimen Description   Final    BLOOD LEFT FOREARM Performed at [facility], [address], [place], [place] [address]    Special Requests   Final    BOTTLES DRAWN AEROBIC AND ANAEROBIC Blood Culture adequate volume Performed at [facility], [address], [place], [place] [address]    Culture PENDING  Incomplete   Report Status PENDING  Incomplete     Medications:    aspirin EC  81 mg Oral QODAY   atenolol  50 mg  Oral Daily   fluticasone  2 spray Each Nare Daily   folic acid  1 mg Oral Daily   hydrALAZINE  25 mg Oral Q8H   insulin aspart  0-9 Units Subcutaneous TID WC   multivitamin  1 tablet Oral Daily   rosuvastatin  20 mg Oral Daily   thiamine (VITAMIN B1) injection  100 mg Intravenous Q24H   Continuous Infusions:  lactated ringers        LOS: 0 days   Marinda Elk  [facility]  08/17/2022, 6:52 AM

## 2022-08-17 NOTE — Evaluation (Signed)
Physical Therapy Evaluation Patient Details Name: [name] MRN: [id number] DOB: 1935/09/26 Today's Date: 08/17/2022  History of Present Illness  87 yo male admitted with acute metabolic encephalopathy, weakness. Hx of knee replacements, hip replacement, foot wounds, DM, chronic alcoholism, gout, CVA, Afib, neuropathy, septic arthritis, SDH  Clinical Impression  On eval, pt required Mod A for bed mobility. He sat EOB for ~5 minutes with Min guard A. Pt presents with general weakness, decreased activity tolerance, and impaired gait and balance. He c/o pain in his back and bil LEs.  He was very stiff and shaky with mobility. Unable to safely attempt standing, ambulation today. Will plan to follow and progress activity as tolerated. Patient will benefit from continued inpatient follow up therapy, <3 hours/day--depends on progress-could return home with HHPT.         If plan is discharge home, recommend the following: Assistance with cooking/housework;Assist for transportation;Help with stairs or ramp for entrance;A lot of help with walking and/or transfers;A lot of help with bathing/dressing/bathroom   Can travel by private vehicle   No    Equipment Recommendations None recommended by PT  Recommendations for Other Services       Functional Status Assessment Patient has had a recent decline in their functional status and demonstrates the ability to make significant improvements in function in a reasonable and predictable amount of time.     Precautions / Restrictions Precautions Precautions: Fall Restrictions Weight Bearing Restrictions: No      Mobility  Bed Mobility Overal bed mobility: Needs Assistance Bed Mobility: Supine to Sit, Sit to Supine     Supine to sit: Mod assist, HOB elevated, Used rails Sit to supine: Mod assist, HOB elevated, Used rails   General bed mobility comments: Assist for trunk and bil LEs. Utilized bedpad for scooting, positioning. Increased time.  Cues for safety.    Transfers Overall transfer level: Needs assistance Equipment used: Rolling walker (2 wheels)               General transfer comment: NT-too weak, pt reported back pain-requested to return to supine    Ambulation/Gait                  Stairs            Wheelchair Mobility     Tilt Bed    Modified Rankin (Stroke Patients Only)       Balance Overall balance assessment: Needs assistance Sitting-balance support: Bilateral upper extremity supported, Feet supported Sitting balance-Leahy Scale: Fair                                       Pertinent Vitals/Pain Pain Assessment Pain Assessment: Faces Faces Pain Scale: Hurts little more Pain Location: back, LEs Pain Descriptors / Indicators: Discomfort, Grimacing, Guarding Pain Intervention(s): Limited activity within patient's tolerance, Monitored during session, Repositioned    Home Living Family/patient expects to be discharged to:: Private residence Living Arrangements: Children Available Help at Discharge: Family;Available 24 hours/day (daughter works from home) Type of Home: House Home Access: Chiropodist of Steps: Engineer, structural Home Layout: Multi-level Home Equipment: Rollator (4 wheels);Cane - single point;Shower seat - built in;Grab bars - tub/shower;Hand held shower head;Wheelchair - manual;Other (comment);Grab bars - toilet;Adaptive equipment Additional Comments: lives with daughter on his own floor    Prior Function Prior Level of Function : Independent/Modified Independent  Mobility Comments: amb household distances with rollator. Rides electric scooter in grocery store. Family drives pt to store and doctor appts. ADLs Comments: Pt is Mod I with basic ADLs using AE prn     Extremity/Trunk Assessment   Upper Extremity Assessment Upper Extremity Assessment: Defer to OT evaluation    Lower Extremity  Assessment Lower Extremity Assessment: Generalized weakness    Cervical / Trunk Assessment Cervical / Trunk Assessment: Normal  Communication   Communication Communication: No apparent difficulties  Cognition Arousal: Alert Behavior During Therapy: WFL for tasks assessed/performed Overall Cognitive Status: No family/caregiver present to determine baseline cognitive functioning Area of Impairment: Problem solving, Orientation                 Orientation Level: Disoriented to, Time           Problem Solving: Requires verbal cues          General Comments      Exercises     Assessment/Plan    PT Assessment Patient needs continued PT services  PT Problem List Decreased strength;Decreased range of motion;Decreased activity tolerance;Decreased balance;Decreased mobility;Decreased knowledge of use of DME       PT Treatment Interventions DME instruction;Gait training;Therapeutic exercise;Therapeutic activities;Patient/family education;Functional mobility training;Balance training    PT Goals (Current goals can be found in the Care Plan section)  Acute Rehab PT Goals Patient Stated Goal: none stated PT Goal Formulation: With patient Time For Goal Achievement: 08/31/22 Potential to Achieve Goals: Good    Frequency Min 1X/week     Co-evaluation               AM-PAC PT "6 Clicks" Mobility  Outcome Measure Help needed turning from your back to your side while in a flat bed without using bedrails?: A Lot Help needed moving from lying on your back to sitting on the side of a flat bed without using bedrails?: A Lot Help needed moving to and from a bed to a chair (including a wheelchair)?: A Lot Help needed standing up from a chair using your arms (e.g., wheelchair or bedside chair)?: A Lot Help needed to walk in hospital room?: A Lot Help needed climbing 3-5 steps with a railing? : Total 6 Click Score: 11    End of Session   Activity Tolerance: Patient  limited by fatigue;Patient limited by pain Patient left: in bed;with call bell/phone within reach;with bed alarm set   PT Visit Diagnosis: Pain;Difficulty in walking, not elsewhere classified (R26.2);Muscle weakness (generalized) (M62.81)    Time: 0981-1914 PT Time Calculation (min) (ACUTE ONLY): 27 min   Charges:   PT Evaluation $PT Eval Low Complexity: 1 Low PT Treatments $Therapeutic Activity: 8-22 mins PT General Charges $$ ACUTE PT VISIT: 1 Visit            Faye Ramsay, PT Acute Rehabilitation  Office: [phone]

## 2022-08-17 NOTE — ED Notes (Signed)
Pt LLE cleansed with saline, pat dry with 4x4 gauze, non-adherent Telfa dressing and Kerlix wrap applied.

## 2022-08-17 NOTE — Progress Notes (Addendum)
Day shift nurse reported that pt has not been voiding since morning.pt is on condom cath. During lunch time , day RN did bladder scan and it has > . Bladder scan done and it was , >392. CN and On Call made aware and waiting for order. Will continue to monitor.

## 2022-08-17 NOTE — ED Triage Notes (Signed)
Pt BIBA([facility]) per family AMS since Tuesday, fall on Wednesday, decreased mobility since fall(mobile @baseline  w/walker). Pt hypotensive 82/54 upon EMS arrival but BP stable now 125/98. Denies N/V, fevers.

## 2022-08-17 NOTE — ED Notes (Signed)
ED TO INPATIENT HANDOFF REPORT  S Name/Age/Gender [name] 87 y.o. male Room/Bed: WA14/WA14  Code Status   Code Status: Full Code  Home/SNF/Other Home Patient oriented to: self, place, time, and situation Is this baseline? Yes   Triage Complete: Triage complete  Chief Complaint Acute encephalopathy [G93.40]  Triage Note Pt BIBA([place]) per family AMS since Tuesday, fall on Wednesday, decreased mobility since fall(mobile @baseline  w/walker). Pt hypotensive 82/54 upon EMS arrival but BP stable now 125/98. Denies N/V, fevers.   Allergies Allergies  Allergen Reactions   Allopurinol Other (See Comments)    Makes head 'feel funny'   Atorvastatin Other (See Comments)    Unknown reaction    Motrin [Ibuprofen] Other (See Comments)    Blisters in groin area    Level of Care/Admitting Diagnosis ED Disposition     ED Disposition  Admit   Condition  --   Comment  Hospital Area: [facility] [[id number]]  Level of Care: Telemetry [5]  Admit to tele based on following criteria: Monitor for Ischemic changes  May admit patient to [facility] or [facility] if equivalent level of care is available:: No  Covid Evaluation: Asymptomatic - no recent exposure (last 10 days) testing not required  Diagnosis: Acute encephalopathy [409811]  Admitting Physician: Angie Fava [[id number]]  Attending Physician: Angie Fava [[id number]]  Certification:: I certify this patient will need inpatient services for at least 2 midnights  Estimated Length of Stay: 2          B Medical/Surgery History Past Medical History:  Diagnosis Date   Acute encephalopathy 05/09/2014   Alcoholism (HCC)    Arthritis    Cerebral infarction due to embolism of left middle cerebral artery (HCC)    Diabetes mellitus type 2, controlled (HCC) 05/04/2014   Essential hypertension 05/04/2014   Gout 05/04/2014   Hypertension    New onset atrial fibrillation (HCC) 05/04/2014   Psoas  abscess (HCC) 05/04/2014   Psoas abscess, right (HCC)    SDH (subdural hematoma) (HCC) 05/12/2014   Sepsis (HCC) 05/04/2014   Septic arthritis of hip (HCC) 05/18/2014   Stenosis of right carotid artery    40-59 percent    Streptococcal bacteremia 05/09/2014   Past Surgical History:  Procedure Laterality Date   ABDOMINAL AORTOGRAM W/LOWER EXTREMITY N/A 09/24/2021   Procedure: ABDOMINAL AORTOGRAM W/LOWER EXTREMITY;  Surgeon: Victorino Sparrow, MD;  Location: [facility] INVASIVE CV LAB;  Service: Cardiovascular;  Laterality: N/A;   ABDOMINAL AORTOGRAM W/LOWER EXTREMITY N/A 03/18/2022   Procedure: ABDOMINAL AORTOGRAM W/LOWER EXTREMITY;  Surgeon: Victorino Sparrow, MD;  Location: [facility] INVASIVE CV LAB;  Service: Cardiovascular;  Laterality: N/A;   AORTOGRAM  10/09/2021   Procedure: AORTOGRAM;  Surgeon: Victorino Sparrow, MD;  Location: [facility] OR;  Service: Vascular;;   APPLICATION OF WOUND VAC Bilateral 10/09/2021   Procedure: APPLICATION OF WOUND VAC;  Surgeon: Victorino Sparrow, MD;  Location: [facility] OR;  Service: Vascular;  Laterality: Bilateral;   BONE BIOPSY Right 08/13/2021   Procedure: BONE BIOPSY;  Surgeon: Vivi Barrack, DPM;  Location: [facility] ORS;  Service: Podiatry;  Laterality: Right;   ENDARTERECTOMY Bilateral 10/09/2021   Procedure: ILIOFEMORAL ENDARTERECTOMY;  Surgeon: Victorino Sparrow, MD;  Location: [facility] OR;  Service: Vascular;  Laterality: Bilateral;   HIP ARTHROPLASTY Right    INSERTION OF ILIAC STENT Bilateral 10/09/2021   Procedure: INSERTION OF RETROGRADE ILIAC STENT;  Surgeon: Victorino Sparrow, MD;  Location[facility];  Service: Vascular;  Laterality:  Bilateral;   JOINT REPLACEMENT     right hip, bilateral knees   left femur surgery Left    with rod   right cataract extraction     TONSILLECTOMY     TOTAL SHOULDER ARTHROPLASTY Bilateral    ULTRASOUND GUIDANCE FOR VASCULAR ACCESS Bilateral 10/09/2021   Procedure: ULTRASOUND GUIDANCE FOR VASCULAR ACCESS;  Surgeon: Victorino Sparrow, MD;  Location: [facility] OR;   Service: Vascular;  Laterality: Bilateral;   WOUND DEBRIDEMENT Right 08/13/2021   Procedure: DEBRIDEMENT WOUND;  Surgeon: Vivi Barrack, DPM;  Location: [facility] ORS;  Service: Podiatry;  Laterality: Right;     A IV Location/Drains/Wounds Patient Lines/Drains/Airways Status     Active Line/Drains/Airways     Name Placement date Placement time Site Days   Peripheral IV 08/17/22 20 G 2.5" Anterior;Left Forearm 08/17/22  0124  Forearm  less than 1   External Urinary Catheter 10/10/21  0900  --  311   Pressure Ulcer 07/21/14 Unstageable - Full thickness tissue loss in which the base of the ulcer is covered by slough (yellow, tan, gray, green or brown) and/or eschar (tan, brown or black) in the wound bed. 4 x 2.5 07/21/14  --  -- 2949   Pressure Ulcer 07/21/14 Unstageable - Full thickness tissue loss in which the base of the ulcer is covered by slough (yellow, tan, gray, green or brown) and/or eschar (tan, brown or black) in the wound bed. 4x 2.5 07/21/14  2135  -- 2949            Intake/Output Last 24 hours No intake or output data in the 24 hours ending 08/17/22 0526  Labs/Imaging Results for orders placed or performed during the hospital encounter of 08/17/22 (from the past 48 hour(s))  Comprehensive metabolic panel     Status: Abnormal   Collection Time: 08/17/22 12:24 AM  Result Value Ref Range   Sodium 138 135 - 145 mmol/L   Potassium 3.8 3.5 - 5.1 mmol/L   Chloride 104 98 - 111 mmol/L   CO2 18 (L) 22 - 32 mmol/L   Glucose, Bld 135 (H) 70 - 99 mg/dL    Comment: Glucose reference range applies only to samples taken after fasting for at least 8 hours.   BUN 32 (H) 8 - 23 mg/dL   Creatinine, Ser 6.64 0.61 - 1.24 mg/dL   Calcium 9.5 8.9 - 40.3 mg/dL   Total Protein 7.5 6.5 - 8.1 g/dL   Albumin 4.1 3.5 - 5.0 g/dL   AST 47 (H) 15 - 41 U/L   ALT 34 0 - 44 U/L   Alkaline Phosphatase 77 38 - 126 U/L   Total Bilirubin 1.2 0.3 - 1.2 mg/dL   GFR, Estimated >47 >42 mL/min    Comment:  (NOTE) Calculated using the CKD-EPI Creatinine Equation (2021)    Anion gap 16 (H) 5 - 15    Comment: Performed at [facility], [address], [place], [place] [address]  CBC with Differential     Status: None   Collection Time: 08/17/22 12:24 AM  Result Value Ref Range   WBC 6.6 4.0 - 10.5 K/uL   RBC 4.73 4.22 - 5.81 MIL/uL   Hemoglobin 14.5 13.0 - 17.0 g/dL   HCT 87.5 64.3 - 32.9 %   MCV 94.3 80.0 - 100.0 fL   MCH 30.7 26.0 - 34.0 pg   MCHC 32.5 30.0 - 36.0 g/dL   RDW 51.8 84.1 - 66.0 %   Platelets 179  150 - 400 K/uL   nRBC 0.0 0.0 - 0.2 %   Neutrophils Relative % 76 %   Neutro Abs 5.1 1.7 - 7.7 K/uL   Lymphocytes Relative 14 %   Lymphs Abs 0.9 0.7 - 4.0 K/uL   Monocytes Relative 9 %   Monocytes Absolute 0.6 0.1 - 1.0 K/uL   Eosinophils Relative 0 %   Eosinophils Absolute 0.0 0.0 - 0.5 K/uL   Basophils Relative 0 %   Basophils Absolute 0.0 0.0 - 0.1 K/uL   Immature Granulocytes 1 %   Abs Immature Granulocytes 0.03 0.00 - 0.07 K/uL    Comment: Performed at [facility], [address] [place], [place] [address]  Protime-INR     Status: None   Collection Time: 08/17/22 12:24 AM  Result Value Ref Range   Prothrombin Time 14.2 11.4 - 15.2 seconds   INR 1.1 0.8 - 1.2    Comment: (NOTE) INR goal varies based on device and disease states. Performed at [facility], [address], [place], [place] [address]   APTT     Status: None   Collection Time: 08/17/22 12:24 AM  Result Value Ref Range   aPTT 25 24 - 36 seconds    Comment: Performed at [facility], [address], [place], [place] [address]  Blood Culture (routine x 2)     Status: None (Preliminary result)   Collection Time: 08/17/22  1:16 AM   Specimen: BLOOD RIGHT HAND  Result Value Ref Range   Specimen Description      BLOOD RIGHT HAND Performed at [facility], [address], [place], [place] [address]    Special Requests       BOTTLES DRAWN AEROBIC ONLY Blood Culture adequate volume Performed at [facility], [address], [place], [place] [address]    Culture PENDING    Report Status PENDING   Blood Culture (routine x 2)     Status: None (Preliminary result)   Collection Time: 08/17/22  1:20 AM   Specimen: BLOOD LEFT FOREARM  Result Value Ref Range   Specimen Description      BLOOD LEFT FOREARM Performed at [facility], [address], [place], [place] [address]    Special Requests      BOTTLES DRAWN AEROBIC AND ANAEROBIC Blood Culture adequate volume Performed at [facility], [address], [place], [place] [address]    Culture PENDING    Report Status PENDING   I-Stat Lactic Acid, ED     Status: Abnormal   Collection Time: 08/17/22  1:29 AM  Result Value Ref Range   Lactic Acid, Venous 2.4 (HH) 0.5 - 1.9 mmol/L   Comment NOTIFIED PHYSICIAN   I-stat chem 8, ED     Status: Abnormal   Collection Time: 08/17/22  1:43 AM  Result Value Ref Range   Sodium 139 135 - 145 mmol/L   Potassium 4.1 3.5 - 5.1 mmol/L   Chloride 107 98 - 111 mmol/L   BUN 38 (H) 8 - 23 mg/dL   Creatinine, Ser 9.62 0.61 - 1.24 mg/dL   Glucose, Bld 952 (H) 70 - 99 mg/dL    Comment: Glucose reference range applies only to samples taken after fasting for at least 8 hours.   Calcium, Ion 1.19 1.15 - 1.40 mmol/L   TCO2 21 (L) 22 - 32 mmol/L   Hemoglobin 14.6 13.0 - 17.0 g/dL   HCT 43.0  39.0 - 52.0 %  Ethanol     Status: None   Collection Time: 08/17/22  3:56 AM  Result Value Ref Range   Alcohol, Ethyl (B) <10 <10 mg/dL    Comment: (NOTE) Lowest detectable limit for serum alcohol is 10 mg/dL.  For medical purposes only. Performed at [facility], [address], [place], [place] [address]   I-Stat Lactic Acid, ED     Status: None   Collection Time: 08/17/22  4:08 AM  Result Value Ref Range   Lactic Acid, Venous 1.4 0.5 - 1.9 mmol/L  Urinalysis, w/ Reflex  to Culture (Infection Suspected) -Urine, Clean Catch     Status: Abnormal   Collection Time: 08/17/22  4:21 AM  Result Value Ref Range   Specimen Source URINE, CLEAN CATCH    Color, Urine YELLOW YELLOW   APPearance HAZY (A) CLEAR   Specific Gravity, Urine 1.030 1.005 - 1.030   pH 5.0 5.0 - 8.0   Glucose, UA NEGATIVE NEGATIVE mg/dL   Hgb urine dipstick NEGATIVE NEGATIVE   Bilirubin Urine NEGATIVE NEGATIVE   Ketones, ur 5 (A) NEGATIVE mg/dL   Protein, ur 440 (A) NEGATIVE mg/dL   Nitrite NEGATIVE NEGATIVE   Leukocytes,Ua NEGATIVE NEGATIVE   RBC / HPF 0-5 0 - 5 RBC/hpf   WBC, UA 0-5 0 - 5 WBC/hpf    Comment:        Reflex urine culture not performed if WBC <=10, OR if Squamous epithelial cells >5. If Squamous epithelial cells >5 suggest recollection.    Bacteria, UA NONE SEEN NONE SEEN   Squamous Epithelial / HPF 0-5 0 - 5 /HPF   Mucus PRESENT     Comment: Performed at [facility], [address] [place], [place] [address]   CT Head Wo Contrast  Result Date: 08/17/2022 CLINICAL DATA:  Head trauma, minor (Age >= 65y); Neck trauma (Age >= 65y). Fall EXAM: CT HEAD WITHOUT CONTRAST CT CERVICAL SPINE WITHOUT CONTRAST TECHNIQUE: Multidetector CT imaging of the head and cervical spine was performed following the standard protocol without intravenous contrast. Multiplanar CT image reconstructions of the cervical spine were also generated. RADIATION DOSE REDUCTION: This exam was performed according to the departmental dose-optimization program which includes automated exposure control, adjustment of the mA and/or kV according to patient size and/or use of iterative reconstruction technique. COMPARISON:  CT head 07/21/2014 FINDINGS: CT HEAD FINDINGS Brain: Cerebral ventricle sizes are concordant with the degree of cerebral volume loss. Patchy and confluent areas of decreased attenuation are noted throughout the deep and periventricular white matter of the cerebral hemispheres  bilaterally, compatible with chronic microvascular ischemic disease. No evidence of large-territorial acute infarction. No parenchymal hemorrhage. No mass lesion. No extra-axial collection. No mass effect or midline shift. No hydrocephalus. Basilar cisterns are patent. Vascular: No hyperdense vessel. Atherosclerotic calcifications are present within the cavernous internal carotid arteries. Skull: No acute fracture or focal lesion. Sinuses/Orbits: Paranasal sinuses and mastoid air cells are clear. The orbits are unremarkable. Other: None. CT CERVICAL SPINE FINDINGS Alignment: Grade 1 anterolisthesis of C2 on C3. Skull base and vertebrae: Multilevel moderate degenerative changes of the spine. No associated severe osseous neural foraminal or central canal stenosis. No acute fracture. No aggressive appearing focal osseous lesion or focal pathologic process. Soft tissues and spinal canal: No prevertebral fluid or swelling. No visible canal hematoma. Upper chest: Emphysematous change. Other: Atherosclerotic plaque of the carotid arteries within the neck. Patient is edentulous. IMPRESSION: 1. No acute intracranial  abnormality. 2. No acute displaced fracture or traumatic listhesis of the cervical spine. Electronically Signed   By: Tish Frederickson M.D.   On: 08/17/2022 03:11   CT Cervical Spine Wo Contrast  Result Date: 08/17/2022 CLINICAL DATA:  Head trauma, minor (Age >= 65y); Neck trauma (Age >= 65y). Fall EXAM: CT HEAD WITHOUT CONTRAST CT CERVICAL SPINE WITHOUT CONTRAST TECHNIQUE: Multidetector CT imaging of the head and cervical spine was performed following the standard protocol without intravenous contrast. Multiplanar CT image reconstructions of the cervical spine were also generated. RADIATION DOSE REDUCTION: This exam was performed according to the departmental dose-optimization program which includes automated exposure control, adjustment of the mA and/or kV according to patient size and/or use of iterative  reconstruction technique. COMPARISON:  CT head 07/21/2014 FINDINGS: CT HEAD FINDINGS Brain: Cerebral ventricle sizes are concordant with the degree of cerebral volume loss. Patchy and confluent areas of decreased attenuation are noted throughout the deep and periventricular white matter of the cerebral hemispheres bilaterally, compatible with chronic microvascular ischemic disease. No evidence of large-territorial acute infarction. No parenchymal hemorrhage. No mass lesion. No extra-axial collection. No mass effect or midline shift. No hydrocephalus. Basilar cisterns are patent. Vascular: No hyperdense vessel. Atherosclerotic calcifications are present within the cavernous internal carotid arteries. Skull: No acute fracture or focal lesion. Sinuses/Orbits: Paranasal sinuses and mastoid air cells are clear. The orbits are unremarkable. Other: None. CT CERVICAL SPINE FINDINGS Alignment: Grade 1 anterolisthesis of C2 on C3. Skull base and vertebrae: Multilevel moderate degenerative changes of the spine. No associated severe osseous neural foraminal or central canal stenosis. No acute fracture. No aggressive appearing focal osseous lesion or focal pathologic process. Soft tissues and spinal canal: No prevertebral fluid or swelling. No visible canal hematoma. Upper chest: Emphysematous change. Other: Atherosclerotic plaque of the carotid arteries within the neck. Patient is edentulous. IMPRESSION: 1. No acute intracranial abnormality. 2. No acute displaced fracture or traumatic listhesis of the cervical spine. Electronically Signed   By: Tish Frederickson M.D.   On: 08/17/2022 03:11   CT CHEST ABDOMEN PELVIS W CONTRAST  Result Date: 08/17/2022 CLINICAL DATA:  Polytrauma, blunt fall, abdominal pain, AMS, possible sepsis EXAM: CT CHEST, ABDOMEN, AND PELVIS WITH CONTRAST TECHNIQUE: Multidetector CT imaging of the chest, abdomen and pelvis was performed following the standard protocol during bolus administration of  intravenous contrast. RADIATION DOSE REDUCTION: This exam was performed according to the departmental dose-optimization program which includes automated exposure control, adjustment of the mA and/or kV according to patient size and/or use of iterative reconstruction technique. CONTRAST:  OMNIPAQUE IOHEXOL 300 MG/ML  SOLN COMPARISON:  Chest x-ray 08/17/2022, x-ray pelvis 08/17/2022 FINDINGS: CHEST: Cardiovascular: No aortic injury. The thoracic aorta is normal in caliber. The heart is enlarged in size. No significant pericardial effusion. Severe atherosclerotic plaque. Four-vessel coronary calcification. Aortic valve leaflet calcification. Mediastinum/Nodes: No pneumomediastinum. No mediastinal hematoma. The esophagus is unremarkable.  Small hiatal hernia. The thyroid is unremarkable. The central airways are patent. No mediastinal, hilar, or axillary lymphadenopathy. Lungs/Pleura: Mild paraseptal emphysematous changes. Bilateral lower lobe atelectasis. Diffuse mild bronchial wall thickening. No focal consolidation. Subpleural left lower lobe 9 x 7 mm pulmonary nodule. Adjacent 4 mm pulmonary nodule. No pulmonary mass. No pulmonary contusion or laceration. No pneumatocele formation. No pleural effusion. No pneumothorax. No hemothorax. Musculoskeletal/Chest wall: No chest wall mass. No acute rib or sternal fracture. No spinal fracture. Chronic T3 and T4 superior endplate compression fractures with associated Schmorl nodes. Bilateral total shoulder arthroplasties. ABDOMEN / PELVIS:  Hepatobiliary: Not enlarged. Nodular hepatic contour. Diffusely hypodense hepatic parenchyma compared to the spleen. No focal lesion. No laceration or subcapsular hematoma. The gallbladder is otherwise unremarkable with no radio-opaque gallstones. No biliary ductal dilatation. Pancreas: Normal pancreatic contour. No main pancreatic duct dilatation. Spleen: Not enlarged. No focal lesion. No laceration, subcapsular hematoma, or vascular  injury. Adrenals/Urinary Tract: No nodularity bilaterally. Bilateral kidneys enhance symmetrically. Bilateral renal cortical scarring. Subcentimeter hypodensities are too small to characterize-no further follow-up indicated. No hydronephrosis. No contusion, laceration, or subcapsular hematoma. No injury to the vascular structures or collecting systems. No hydroureter. The urinary bladder is unremarkable. Stomach/Bowel: No small or large bowel wall thickening or dilatation. Colonic diverticulosis. 8 cm rectal stool ball. The appendix is unremarkable. Vasculature/Lymphatics: Atherosclerotic plaque. The portal, splenic, superior mesenteric veins are patent. No abdominal aorta or iliac aneurysm. No active contrast extravasation or pseudoaneurysm. No abdominal, pelvic, inguinal lymphadenopathy. Reproductive: Normal. Other: No simple free fluid ascites. No pneumoperitoneum. No hemoperitoneum. No mesenteric hematoma identified. No organized fluid collection. Musculoskeletal: No significant soft tissue hematoma. Small fat containing umbilical hernia. No acute pelvic fracture. No spinal fracture. Multilevel severe degenerative changes spine with multilevel severe intervertebral disc space narrowing and vacuum phenomenon. Disc bulge at the L3 and L4 and L4-L5 levels. Grade 1 anterolisthesis of L3 on L4 and L4 on L5 (total of 6 lumbar bodies). Total right hip arthroplasty partially visualized. Plate and screw fixation of the proximal femur partially visualized. Ports and Devices: None. IMPRESSION: 1. No acute intrathoracic, intra-abdominal, intrapelvic traumatic injury. 2. No acute fracture or traumatic malalignment of the thoracic or lumbar spine. Other imaging findings of potential clinical significance: 1. Aortic Atherosclerosis (ICD10-I70.0) and Emphysema (ICD10-J43.9). 2. Multiple pulmonary nodules. Most significant: Left solid pulmonary nodule measuring 8 mm.Per Fleischner Society Guidelines, recommend a non-contrast  Chest CT at 3-6 months, then another non-contrast Chest CT at 18-24 months. These guidelines do not apply to immunocompromised patients and patients with cancer. Follow up in patients with significant comorbidities as clinically warranted. For lung cancer screening, adhere to Lung-RADS guidelines. Reference: Radiology. 2017; 284(1):228-43. 3. Cardiomegaly. 4. Small hiatal hernia. 5. Cirrhosis with no definite finding of portal hypertension. No focal liver lesions identified. Please note that liver protocol enhanced MR and CT are the most sensitive tests for the screening detection of hepatocellular carcinoma in the high risk setting of cirrhosis. 6. Colonic diverticulosis with no acute diverticulitis. 7. An 8 cm rectal stool ball. Electronically Signed   By: Tish Frederickson M.D.   On: 08/17/2022 03:08   DG HIPS BILAT WITH PELVIS 3-4 VIEWS  Result Date: 08/17/2022 CLINICAL DATA:  [id number] Fall [id number] EXAM: DG HIP (WITH OR WITHOUT PELVIS) 3-4V BILAT COMPARISON:  CT abdomen pelvis 05/21/2014 FINDINGS: Total right hip arthroplasty. Plate and screw fixation of the proximal left femur partially visualized. No radiographic findings suggest surgical hardware complication. There is no evidence of hip fracture or dislocation of both hips. No acute displaced fracture or diastasis of the bones of the pelvis. There is no evidence of arthropathy or other focal bone abnormality. Vascular calcification. IMPRESSION: Negative for acute traumatic injury. Electronically Signed   By: Tish Frederickson M.D.   On: 08/17/2022 01:24   DG Chest Port 1 View  Result Date: 08/17/2022 CLINICAL DATA:  Questionable sepsis - evaluate for abnormality EXAM: PORTABLE CHEST 1 VIEW COMPARISON:  Chest x-ray 07/22/2014, CT chest 05/11/2014 FINDINGS: The heart and mediastinal contours are within normal limits. Atherosclerotic plaque. Low lung volumes. No focal consolidation. No pulmonary edema.  No pleural effusion. No pneumothorax. No acute osseous  abnormality. Bilateral total shoulder arthroplasty. IMPRESSION: 1. Low lung volumes with no active disease. 2.  Aortic Atherosclerosis (ICD10-I70.0). Electronically Signed   By: Tish Frederickson M.D.   On: 08/17/2022 01:15    Pending Labs Unresulted Labs (From admission, onward)     Start     Ordered   08/17/22 0513  Magnesium  Add-on,   AD        08/17/22 0512            Vitals/Pain Today's Vitals   08/17/22 0007 08/17/22 0014 08/17/22 0304 08/17/22 0453  BP: (!) 125/98  (!) 141/93   Pulse: 95  100   Resp: 13  18   Temp: 98 F (36.7 C)   98.4 F (36.9 C)  TempSrc: Oral   Oral  SpO2: 97%  100%   Weight:  81.7 kg    Height:  5\' 8"  (1.727 m)      Isolation Precautions No active isolations  Medications Medications  acetaminophen (TYLENOL) tablet 650 mg (has no administration in time range)    Or  acetaminophen (TYLENOL) suppository 650 mg (has no administration in time range)  melatonin tablet 3 mg (has no administration in time range)  ondansetron (ZOFRAN) injection 4 mg (has no administration in time range)  sodium chloride 0.9 % bolus 1,000 mL (1,000 mLs Intravenous Bolus 08/17/22 0207)  iohexol (OMNIPAQUE) 300 MG/ML solution 100 mL (100 mLs Intravenous Contrast Given 08/17/22 0229)    Mobility walks with person assist d/t recent fall and with a RW      R Report given to: Carley Hammed

## 2022-08-17 NOTE — ED Provider Notes (Signed)
[place] EMERGENCY DEPARTMENT AT [facility] Provider Note   CSN: [id number] Arrival date & time: 08/16/22  2357     History  Chief Complaint  Patient presents with   Altered Mental Status    [name] is a 87 y.o. male.  The history is provided by the patient, the EMS personnel, medical records and a relative.  Altered Mental Status [name] is a 87 y.o. male who presents to the Emergency Department complaining of altered mental status.  Level 5 caveat due to altered mental status.  He presents to the emergency department by EMS from home for evaluation of change in mental status.  He has been altered since Tuesday of last week.  He did have an unwitnessed fall on Wednesday.  Family brought him in today due to concern for worsening mental status.  He is now too weak to get up and ambulate.  Patient reports feeling cold, no additional complaints.  Daughter states that he had a prior episode of similar behavior when he had a septic hip. EMS reports initial blood pressure of 60 systolic, improved to 80s without intervention.    Home Medications Prior to Admission medications   Medication Sig Start Date End Date Taking? Authorizing Provider  aspirin EC 81 MG tablet Take 81 mg by mouth every other day. Swallow whole.    [provider]  atenolol (TENORMIN) 50 MG tablet Take 50 mg by mouth daily. 04/20/20   [provider]  clopidogrel (PLAVIX) 75 MG tablet TAKE 1 TABLET BY MOUTH ONCE DAILY AT 6 AM 04/15/22   Emilie Rutter, PA-C  fluticasone (FLONASE) 50 MCG/ACT nasal spray Place 2 sprays into both nostrils daily. 04/20/20   [provider]  furosemide (LASIX) 20 MG tablet Take 20 mg by mouth daily. 04/07/21   [provider]  hydrALAZINE (APRESOLINE) 25 MG tablet Take 1 tablet (25 mg total) by mouth every 8 (eight) hours. 05/24/14   Rama, Maryruth Bun, MD  irbesartan (AVAPRO) 75 MG tablet Take 75 mg by mouth daily. 03/31/20   [provider]  LYRICA 75 MG capsule Take 75-150 mg by mouth See admin instructions. Take 75 mg by mouth in the morning and 150 mg at night 04/27/14   [provider]  metFORMIN (GLUCOPHAGE) 1000 MG tablet Take 1,000 mg by mouth in the morning and at bedtime. 07/16/14   [provider]  Multiple Vitamin (MULTIVITAMIN WITH MINERALS) TABS tablet Take 1 tablet by mouth daily. 05/24/14   Rama, Maryruth Bun, MD  neomycin-bacitracin-polymyxin (NEOSPORIN) [phone] ointment Apply 1 Application topically daily as needed (wound care).    [provider]  protein supplement shake (PREMIER PROTEIN) LIQD Take 11 oz by mouth daily.    [provider]  rosuvastatin (CRESTOR) 20 MG tablet TAKE 1 TABLET BY MOUTH EVERY DAY 08/03/22   Corky Crafts, MD  SANTYL 250 UNIT/GM ointment Apply 1 Application topically daily. 02/10/22   [provider]  Tetrahydrozoline HCl (VISINE OP) Place 1 drop into both eyes daily as needed (burning).    [provider]  traMADol (ULTRAM) 50 MG tablet Take 2 tablets (100 mg total) by mouth every 6 (six) hours as needed. Every 4 hours 05/24/14   Rama, Maryruth Bun, MD  ULORIC 80 MG TABS Take 80 mg by mouth daily. 02/22/14   [provider]      Allergies    Allopurinol, Atorvastatin, and Motrin [ibuprofen]    Review of Systems  Review of Systems  All other systems reviewed and are negative.   Physical Exam Updated Vital Signs BP (!) 162/118 (BP Location: Left Arm)   Pulse 90   Temp 98.9 F (37.2 C) (Oral)   Resp 17   Ht 5\' 8"  (1.727 m)   Wt 81.7 kg   SpO2 100%   BMI 27.40 kg/m  Physical Exam Vitals and nursing note reviewed.  Constitutional:      Appearance: He is well-developed.  HENT:     Head: Normocephalic and atraumatic.  Cardiovascular:     Rate and Rhythm: Regular rhythm. Tachycardia present.     Heart sounds: No murmur heard. Pulmonary:     Effort: Pulmonary effort is normal. No respiratory  distress.     Breath sounds: Normal breath sounds.  Abdominal:     Palpations: Abdomen is soft.     Tenderness: There is no guarding or rebound.     Comments: Generalized abdominal tenderness  Musculoskeletal:     Comments: Faint pedal pulses bilaterally.  He has tenderness to palpation over bilateral hips, no tenderness over the knees, ankles, feet.  There is a chronic appearing wound with chronic venous stasis changes to the left distal lower extremity.  Skin:    General: Skin is warm and dry.  Neurological:     Mental Status: He is alert.     Comments: Oriented to person, place.  Disoriented to time and recent events.  Unable to lift either leg off the stretcher.  4-5 grip strength in bilateral upper extremities.  Psychiatric:        Behavior: Behavior normal.     ED Results / Procedures / Treatments   Labs (all labs ordered are listed, but only abnormal results are displayed) Labs Reviewed  COMPREHENSIVE METABOLIC PANEL - Abnormal; Notable for the following components:      Result Value   CO2 18 (*)    Glucose, Bld 135 (*)    BUN 32 (*)    AST 47 (*)    Anion gap 16 (*)    All other components within normal limits  URINALYSIS, W/ REFLEX TO CULTURE (INFECTION SUSPECTED) - Abnormal; Notable for the following components:   APPearance HAZY (*)    Ketones, ur 5 (*)    Protein, ur 100 (*)    All other components within normal limits  I-STAT CG4 LACTIC ACID, ED - Abnormal; Notable for the following components:   Lactic Acid, Venous 2.4 (*)    All other components within normal limits  I-STAT CHEM 8, ED - Abnormal; Notable for the following components:   BUN 38 (*)    Glucose, Bld 133 (*)    TCO2 21 (*)    All other components within normal limits  CULTURE, BLOOD (ROUTINE X 2)  CULTURE, BLOOD (ROUTINE X 2)  CBC WITH DIFFERENTIAL/PLATELET  PROTIME-INR  APTT  ETHANOL  MAGNESIUM  AMMONIA  VITAMIN B12  CK  RAPID URINE DRUG SCREEN, HOSP PERFORMED  TSH  SEDIMENTATION RATE   C-REACTIVE PROTEIN  CREATININE, URINE, RANDOM  SODIUM, URINE, RANDOM  HEMOGLOBIN A1C  BLOOD GAS, VENOUS  BASIC METABOLIC PANEL  CBC  I-STAT CG4 LACTIC ACID, ED    EKG EKG Interpretation Date/Time:  Monday August 17 2022 00:06:53 EDT Ventricular Rate:  97 PR Interval:    QRS Duration:  90 QT Interval:  377 QTC Calculation: 479 R Axis:   5  Text Interpretation: Atrial fibrillation Minimal ST depression, anterolateral leads Borderline prolonged QT interval Confirmed  by Tilden Fossa [phone]) on 08/17/2022 3:46:49 AM  Radiology CT Head Wo Contrast  Result Date: 08/17/2022 CLINICAL DATA:  Head trauma, minor (Age >= 65y); Neck trauma (Age >= 65y). Fall EXAM: CT HEAD WITHOUT CONTRAST CT CERVICAL SPINE WITHOUT CONTRAST TECHNIQUE: Multidetector CT imaging of the head and cervical spine was performed following the standard protocol without intravenous contrast. Multiplanar CT image reconstructions of the cervical spine were also generated. RADIATION DOSE REDUCTION: This exam was performed according to the departmental dose-optimization program which includes automated exposure control, adjustment of the mA and/or kV according to patient size and/or use of iterative reconstruction technique. COMPARISON:  CT head 07/21/2014 FINDINGS: CT HEAD FINDINGS Brain: Cerebral ventricle sizes are concordant with the degree of cerebral volume loss. Patchy and confluent areas of decreased attenuation are noted throughout the deep and periventricular white matter of the cerebral hemispheres bilaterally, compatible with chronic microvascular ischemic disease. No evidence of large-territorial acute infarction. No parenchymal hemorrhage. No mass lesion. No extra-axial collection. No mass effect or midline shift. No hydrocephalus. Basilar cisterns are patent. Vascular: No hyperdense vessel. Atherosclerotic calcifications are present within the cavernous internal carotid arteries. Skull: No acute fracture or focal  lesion. Sinuses/Orbits: Paranasal sinuses and mastoid air cells are clear. The orbits are unremarkable. Other: None. CT CERVICAL SPINE FINDINGS Alignment: Grade 1 anterolisthesis of C2 on C3. Skull base and vertebrae: Multilevel moderate degenerative changes of the spine. No associated severe osseous neural foraminal or central canal stenosis. No acute fracture. No aggressive appearing focal osseous lesion or focal pathologic process. Soft tissues and spinal canal: No prevertebral fluid or swelling. No visible canal hematoma. Upper chest: Emphysematous change. Other: Atherosclerotic plaque of the carotid arteries within the neck. Patient is edentulous. IMPRESSION: 1. No acute intracranial abnormality. 2. No acute displaced fracture or traumatic listhesis of the cervical spine. Electronically Signed   By: Tish Frederickson M.D.   On: 08/17/2022 03:11   CT Cervical Spine Wo Contrast  Result Date: 08/17/2022 CLINICAL DATA:  Head trauma, minor (Age >= 65y); Neck trauma (Age >= 65y). Fall EXAM: CT HEAD WITHOUT CONTRAST CT CERVICAL SPINE WITHOUT CONTRAST TECHNIQUE: Multidetector CT imaging of the head and cervical spine was performed following the standard protocol without intravenous contrast. Multiplanar CT image reconstructions of the cervical spine were also generated. RADIATION DOSE REDUCTION: This exam was performed according to the departmental dose-optimization program which includes automated exposure control, adjustment of the mA and/or kV according to patient size and/or use of iterative reconstruction technique. COMPARISON:  CT head 07/21/2014 FINDINGS: CT HEAD FINDINGS Brain: Cerebral ventricle sizes are concordant with the degree of cerebral volume loss. Patchy and confluent areas of decreased attenuation are noted throughout the deep and periventricular white matter of the cerebral hemispheres bilaterally, compatible with chronic microvascular ischemic disease. No evidence of large-territorial acute  infarction. No parenchymal hemorrhage. No mass lesion. No extra-axial collection. No mass effect or midline shift. No hydrocephalus. Basilar cisterns are patent. Vascular: No hyperdense vessel. Atherosclerotic calcifications are present within the cavernous internal carotid arteries. Skull: No acute fracture or focal lesion. Sinuses/Orbits: Paranasal sinuses and mastoid air cells are clear. The orbits are unremarkable. Other: None. CT CERVICAL SPINE FINDINGS Alignment: Grade 1 anterolisthesis of C2 on C3. Skull base and vertebrae: Multilevel moderate degenerative changes of the spine. No associated severe osseous neural foraminal or central canal stenosis. No acute fracture. No aggressive appearing focal osseous lesion or focal pathologic process. Soft tissues and spinal canal: No prevertebral fluid or swelling. No visible  canal hematoma. Upper chest: Emphysematous change. Other: Atherosclerotic plaque of the carotid arteries within the neck. Patient is edentulous. IMPRESSION: 1. No acute intracranial abnormality. 2. No acute displaced fracture or traumatic listhesis of the cervical spine. Electronically Signed   By: Tish Frederickson M.D.   On: 08/17/2022 03:11   CT CHEST ABDOMEN PELVIS W CONTRAST  Result Date: 08/17/2022 CLINICAL DATA:  Polytrauma, blunt fall, abdominal pain, AMS, possible sepsis EXAM: CT CHEST, ABDOMEN, AND PELVIS WITH CONTRAST TECHNIQUE: Multidetector CT imaging of the chest, abdomen and pelvis was performed following the standard protocol during bolus administration of intravenous contrast. RADIATION DOSE REDUCTION: This exam was performed according to the departmental dose-optimization program which includes automated exposure control, adjustment of the mA and/or kV according to patient size and/or use of iterative reconstruction technique. CONTRAST:  OMNIPAQUE IOHEXOL 300 MG/ML  SOLN COMPARISON:  Chest x-ray 08/17/2022, x-ray pelvis 08/17/2022 FINDINGS: CHEST: Cardiovascular: No  aortic injury. The thoracic aorta is normal in caliber. The heart is enlarged in size. No significant pericardial effusion. Severe atherosclerotic plaque. Four-vessel coronary calcification. Aortic valve leaflet calcification. Mediastinum/Nodes: No pneumomediastinum. No mediastinal hematoma. The esophagus is unremarkable.  Small hiatal hernia. The thyroid is unremarkable. The central airways are patent. No mediastinal, hilar, or axillary lymphadenopathy. Lungs/Pleura: Mild paraseptal emphysematous changes. Bilateral lower lobe atelectasis. Diffuse mild bronchial wall thickening. No focal consolidation. Subpleural left lower lobe 9 x 7 mm pulmonary nodule. Adjacent 4 mm pulmonary nodule. No pulmonary mass. No pulmonary contusion or laceration. No pneumatocele formation. No pleural effusion. No pneumothorax. No hemothorax. Musculoskeletal/Chest wall: No chest wall mass. No acute rib or sternal fracture. No spinal fracture. Chronic T3 and T4 superior endplate compression fractures with associated Schmorl nodes. Bilateral total shoulder arthroplasties. ABDOMEN / PELVIS: Hepatobiliary: Not enlarged. Nodular hepatic contour. Diffusely hypodense hepatic parenchyma compared to the spleen. No focal lesion. No laceration or subcapsular hematoma. The gallbladder is otherwise unremarkable with no radio-opaque gallstones. No biliary ductal dilatation. Pancreas: Normal pancreatic contour. No main pancreatic duct dilatation. Spleen: Not enlarged. No focal lesion. No laceration, subcapsular hematoma, or vascular injury. Adrenals/Urinary Tract: No nodularity bilaterally. Bilateral kidneys enhance symmetrically. Bilateral renal cortical scarring. Subcentimeter hypodensities are too small to characterize-no further follow-up indicated. No hydronephrosis. No contusion, laceration, or subcapsular hematoma. No injury to the vascular structures or collecting systems. No hydroureter. The urinary bladder is unremarkable. Stomach/Bowel: No  small or large bowel wall thickening or dilatation. Colonic diverticulosis. 8 cm rectal stool ball. The appendix is unremarkable. Vasculature/Lymphatics: Atherosclerotic plaque. The portal, splenic, superior mesenteric veins are patent. No abdominal aorta or iliac aneurysm. No active contrast extravasation or pseudoaneurysm. No abdominal, pelvic, inguinal lymphadenopathy. Reproductive: Normal. Other: No simple free fluid ascites. No pneumoperitoneum. No hemoperitoneum. No mesenteric hematoma identified. No organized fluid collection. Musculoskeletal: No significant soft tissue hematoma. Small fat containing umbilical hernia. No acute pelvic fracture. No spinal fracture. Multilevel severe degenerative changes spine with multilevel severe intervertebral disc space narrowing and vacuum phenomenon. Disc bulge at the L3 and L4 and L4-L5 levels. Grade 1 anterolisthesis of L3 on L4 and L4 on L5 (total of 6 lumbar bodies). Total right hip arthroplasty partially visualized. Plate and screw fixation of the proximal femur partially visualized. Ports and Devices: None. IMPRESSION: 1. No acute intrathoracic, intra-abdominal, intrapelvic traumatic injury. 2. No acute fracture or traumatic malalignment of the thoracic or lumbar spine. Other imaging findings of potential clinical significance: 1. Aortic Atherosclerosis (ICD10-I70.0) and Emphysema (ICD10-J43.9). 2. Multiple pulmonary nodules. Most significant: Left solid pulmonary  nodule measuring 8 mm.Per Fleischner Society Guidelines, recommend a non-contrast Chest CT at 3-6 months, then another non-contrast Chest CT at 18-24 months. These guidelines do not apply to immunocompromised patients and patients with cancer. Follow up in patients with significant comorbidities as clinically warranted. For lung cancer screening, adhere to Lung-RADS guidelines. Reference: Radiology. 2017; 284(1):228-43. 3. Cardiomegaly. 4. Small hiatal hernia. 5. Cirrhosis with no definite finding of  portal hypertension. No focal liver lesions identified. Please note that liver protocol enhanced MR and CT are the most sensitive tests for the screening detection of hepatocellular carcinoma in the high risk setting of cirrhosis. 6. Colonic diverticulosis with no acute diverticulitis. 7. An 8 cm rectal stool ball. Electronically Signed   By: Tish Frederickson M.D.   On: 08/17/2022 03:08   DG HIPS BILAT WITH PELVIS 3-4 VIEWS  Result Date: 08/17/2022 CLINICAL DATA:  [id number] Fall [id number] EXAM: DG HIP (WITH OR WITHOUT PELVIS) 3-4V BILAT COMPARISON:  CT abdomen pelvis 05/21/2014 FINDINGS: Total right hip arthroplasty. Plate and screw fixation of the proximal left femur partially visualized. No radiographic findings suggest surgical hardware complication. There is no evidence of hip fracture or dislocation of both hips. No acute displaced fracture or diastasis of the bones of the pelvis. There is no evidence of arthropathy or other focal bone abnormality. Vascular calcification. IMPRESSION: Negative for acute traumatic injury. Electronically Signed   By: Tish Frederickson M.D.   On: 08/17/2022 01:24   DG Chest Port 1 View  Result Date: 08/17/2022 CLINICAL DATA:  Questionable sepsis - evaluate for abnormality EXAM: PORTABLE CHEST 1 VIEW COMPARISON:  Chest x-ray 07/22/2014, CT chest 05/11/2014 FINDINGS: The heart and mediastinal contours are within normal limits. Atherosclerotic plaque. Low lung volumes. No focal consolidation. No pulmonary edema. No pleural effusion. No pneumothorax. No acute osseous abnormality. Bilateral total shoulder arthroplasty. IMPRESSION: 1. Low lung volumes with no active disease. 2.  Aortic Atherosclerosis (ICD10-I70.0). Electronically Signed   By: Tish Frederickson M.D.   On: 08/17/2022 01:15    Procedures Procedures    Medications Ordered in ED Medications  acetaminophen (TYLENOL) tablet 650 mg (has no administration in time range)    Or  acetaminophen (TYLENOL) suppository 650 mg  (has no administration in time range)  melatonin tablet 3 mg (has no administration in time range)  ondansetron (ZOFRAN) injection 4 mg (has no administration in time range)  aspirin EC tablet 81 mg (has no administration in time range)  atenolol (TENORMIN) tablet 50 mg (has no administration in time range)  fluticasone (FLONASE) 50 MCG/ACT nasal spray 2 spray (has no administration in time range)  hydrALAZINE (APRESOLINE) tablet 25 mg (has no administration in time range)  rosuvastatin (CRESTOR) tablet 20 mg (has no administration in time range)  folic acid (FOLVITE) tablet 1 mg (has no administration in time range)  multivitamin (PROSIGHT) tablet 1 tablet (has no administration in time range)  thiamine (VITAMIN B1) injection 100 mg (has no administration in time range)  insulin aspart (novoLOG) injection 0-9 Units (has no administration in time range)  hydrALAZINE (APRESOLINE) injection 10 mg (has no administration in time range)  0.9 %  sodium chloride infusion (has no administration in time range)  lactulose (CHRONULAC) 10 GM/15ML solution 10 g (has no administration in time range)  clopidogrel (PLAVIX) tablet 75 mg (has no administration in time range)  sodium chloride 0.9 % bolus 1,000 mL (1,000 mLs Intravenous Bolus 08/17/22 0207)  iohexol (OMNIPAQUE) 300 MG/ML solution 100 mL (100 mLs Intravenous Contrast Given  08/17/22 0229)    ED Course/ Medical Decision Making/ A&P Clinical Course as of 08/17/22 0865  [facility] Aug 17, 2022  0156 Protime-INR [DL]  7846 APTT [DL]    Clinical Course User Index [DL] Launa Grill, NT                                 Medical Decision Making Amount and/or Complexity of Data Reviewed Labs: ordered. Radiology: ordered. ECG/medicine tests: ordered.  Risk Prescription drug management. Decision regarding hospitalization.   Patient with history of persistent atrial fibrillation not on anticoagulation, alcohol use here for evaluation for  confusion, weakness over the last week.  He did have a fall several days ago.  Patient is confused on examination with significant global weakness.  He did have hypotension for EMS, corrected after IV fluid administration.  Initial lactic acid was elevated, this did clear with IV fluids.  There is no clear evidence of acute infectious process at this time.  Given his abdominal tenderness a CT abdomen pelvis was obtained, which is significant for large stool ball.  Nursing to perform disimpaction.        Final Clinical Impression(s) / ED Diagnoses Final diagnoses:  Disorientation  Dehydration    Rx / DC Orders ED Discharge Orders     None         Tilden Fossa, MD 08/17/22 [phone]

## 2022-08-17 NOTE — H&P (Signed)
History and Physical      [name] QQV:[id number] DOB: [birth year] DOA: 08/17/2022; DOS: 08/17/2022  PCP: Darrow Bussing, MD  Patient coming from: home   I have personally briefly reviewed patient's old medical records in [facility]  Chief Complaint: altered mental status  HPI: [name] is a 87 y.o. male with medical history significant for chronic atrial fibrillation not on chronic anticoagulation, chronic alcohol abuse, type 2 diabetes mellitus complicated by diabetic peripheral polyneuropathy, send hypertension, chronic left lower extremity wound, who is admitted to [facility] on 08/17/2022 with suspected acute metabolic encephalopathy after presenting from home to [facility] ED for evaluation of altered mental status.  In the setting of the patient's altered mental status, the following history is provided by the patient's daughter in addition to my discussions with the EDP and via chart review.  The patient lives at home with his daughter, who one half of town for a few days and upon returning 2 to 3 days ago, noted the patient to be confused relative to his baseline mental status.  Daughter had arranged for a caregiver to check in with the patient while she was traveling.  Caregiver conveys that the patient did have 1 fall while the daughter was traveling.  No report of any associated loss of consciousness with this.  Patient is denying any acute arthralgias or myalgias as a consequence of this fall.  Additionally, the patient is experiencing some generalized weakness in the absence of any acute focal weakness relative to his baseline ambulatory status.  Daughter conveys that he was previously hospitalized for acute metabolic encephalopathy in the setting of a septic right hip, which, per chart review was complicated by strep bacteremia.  The patient reiterates that he is not experiencing any recent right hip discomfort.  The patient has a history of chronic left lower  extremity wound associate with the anterior aspect of his left leg just proximal to the left ankle, without any recent significant changes reported to be associate with his wound.     ED Course:  Vital signs in the ED were notable for the following: Afebrile; heart rates in the range of 95-100; systolic blood pressures in the range of 120s to 140s; respiratory rate 18, oxygen saturation 97 to 100% on room air.  Labs were notable for the following: CMP notable for the following: Sodium 138, bicarbonate 18, anion gap 16, BUN 32, creatinine 0.95 compared to most recent prior serum creatinine data point of 0.50 in March 2024, BUN Crea ratio greater than 20, glucose 135, liver enzymes within normal limits.  Serum ethanol level less than 10.  CBC notable for white blood cell count 6600.  INR 1.1, PTT 25.  Initial lactic acid 2.4, with repeat value trending down to 1.4.  Urinalysis was notable for no white blood cells, leukocyte esterase/nitrate negative, no RBCs, no squamous epithelial cells, 100 protein, 5 ketones, and specific gravity 1.030.  Blood cultures x 2 were collected.  Per my interpretation, EKG in ED demonstrated the following: Rate controlled atrial fibrillation with heart rate 97, no evidence of T wave or ST changes, including no evidence of ST elevation.  Imaging in the ED, per corresponding formal radiology read, was notable for the following: Chest x-ray, 1 view, showed low lung volumes, no evidence of acute cardiopulmonary process, including no evidence of infiltrate.  Noncontrast CT head showed no evidence of acute intracranial process, including no evidence of intracranial pressure or any evidence of acute  infarct.  CT cervical spine showed no evidence of acute cervical sinus fracture or subluxation injury.  Plain films of the bilateral hips showed no evidence of acute hip fracture or dislocation.  CT chest, abdomen, pelvis with contrast showed no evidence of acute intrathoracic, acute  intra-abdominal, or acute intrapelvic process, while noting a left-sided pulmonary nodule as well as an 8 centimeter rectal stool ball, without any evidence of bowel obstruction, abscess, or perforation.  While in the ED, the following were administered: Normal saline x 1 L bolus.  Subsequently, the patient was admitted for further evaluation management of suspected acute metabolic encephalopathy complicated by generalized weakness in the setting of acute kidney injury, dehydration and anion gap metabolic acidosis.    Review of Systems: As per HPI otherwise 10 point review of systems negative.   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/04/2014   Psoas abscess, right (HCC)    SDH (subdural hematoma) (HCC) 05/12/2014   Sepsis (HCC) 05/04/2014   Septic arthritis of hip (HCC) 05/18/2014   Stenosis of right carotid artery    40-59 percent    Streptococcal bacteremia 05/09/2014    Past Surgical History:  Procedure Laterality Date   ABDOMINAL AORTOGRAM W/LOWER EXTREMITY N/A 09/24/2021   Procedure: ABDOMINAL AORTOGRAM W/LOWER EXTREMITY;  Surgeon: Victorino Sparrow, MD;  Location: [facility] INVASIVE CV LAB;  Service: Cardiovascular;  Laterality: N/A;   ABDOMINAL AORTOGRAM W/LOWER EXTREMITY N/A 03/18/2022   Procedure: ABDOMINAL AORTOGRAM W/LOWER EXTREMITY;  Surgeon: Victorino Sparrow, MD;  Location: [facility] INVASIVE CV LAB;  Service: Cardiovascular;  Laterality: N/A;   AORTOGRAM  10/09/2021   Procedure: AORTOGRAM;  Surgeon: Victorino Sparrow, MD;  Location: [facility] OR;  Service: Vascular;;   APPLICATION OF WOUND VAC Bilateral 10/09/2021   Procedure: APPLICATION OF WOUND VAC;  Surgeon:  Victorino Sparrow, MD;  Location: [facility] OR;  Service: Vascular;  Laterality: Bilateral;   BONE BIOPSY Right 08/13/2021   Procedure: BONE BIOPSY;  Surgeon: Vivi Barrack, DPM;  Location: [facility] ORS;  Service: Podiatry;  Laterality: Right;   ENDARTERECTOMY Bilateral 10/09/2021   Procedure: ILIOFEMORAL ENDARTERECTOMY;  Surgeon: Victorino Sparrow, MD;  Location: [facility] OR;  Service: Vascular;  Laterality: Bilateral;   HIP ARTHROPLASTY Right    INSERTION OF ILIAC STENT Bilateral 10/09/2021   Procedure: INSERTION OF RETROGRADE ILIAC STENT;  Surgeon: Victorino Sparrow, MD;  Location: [facility] OR;  Service: Vascular;  Laterality: Bilateral;   JOINT REPLACEMENT     right hip, bilateral knees   left femur surgery Left    with rod   right cataract extraction     TONSILLECTOMY     TOTAL SHOULDER ARTHROPLASTY Bilateral    ULTRASOUND GUIDANCE FOR VASCULAR ACCESS Bilateral 10/09/2021   Procedure: ULTRASOUND GUIDANCE FOR VASCULAR ACCESS;  Surgeon: Victorino Sparrow, MD;  Location: [facility] OR;  Service: Vascular;  Laterality: Bilateral;   WOUND DEBRIDEMENT Right 08/13/2021   Procedure: DEBRIDEMENT WOUND;  Surgeon: Vivi Barrack, DPM;  Location: [facility] ORS;  Service: Podiatry;  Laterality: Right;    Social History:  reports that he has quit smoking. His smoking use included cigarettes. He has never been  exposed to tobacco smoke. He has never used smokeless tobacco. He reports current alcohol use. He reports that he does not currently use drugs.   Allergies  Allergen Reactions   Allopurinol Other (See Comments)    Makes head 'feel funny'   Atorvastatin Other (See Comments)    Unknown reaction    Motrin [Ibuprofen] Other (See Comments)    Blisters in groin area    Family History  Problem Relation Age of Onset   Diabetes Mellitus II Daughter     Family history reviewed and not pertinent    Prior to Admission medications   Medication Sig Start Date End Date Taking? Authorizing Provider  amoxicillin (AMOXIL) 500 MG  capsule TAKE 1 CAPSULE BY MOUTH TWICE A DAY 06/23/21   Comer, Belia Heman, MD  APPLE CIDER VINEGAR PO Take 1 capsule by mouth daily.    [provider]  aspirin EC 81 MG tablet Take 81 mg by mouth every other day. Swallow whole.    [provider]  atenolol (TENORMIN) 50 MG tablet Take 50 mg by mouth daily. 04/20/20   [provider]  clopidogrel (PLAVIX) 75 MG tablet TAKE 1 TABLET BY MOUTH ONCE DAILY AT 6 AM 04/15/22   Emilie Rutter, PA-C  fluticasone (FLONASE) 50 MCG/ACT nasal spray Place 2 sprays into both nostrils daily. 04/20/20   [provider]  furosemide (LASIX) 20 MG tablet Take 20 mg by mouth daily. 04/07/21   [provider]  hydrALAZINE (APRESOLINE) 25 MG tablet Take 1 tablet (25 mg total) by mouth every 8 (eight) hours. 05/24/14   Rama, Maryruth Bun, MD  irbesartan (AVAPRO) 75 MG tablet Take 75 mg by mouth daily. 03/31/20   [provider]  LYRICA 75 MG capsule Take 75-150 mg by mouth See admin instructions. Take 75 mg by mouth in the morning and 150 mg at night 04/27/14   [provider]  meloxicam (MOBIC) 7.5 MG tablet Take 7.5 mg by mouth daily.    [provider]  metFORMIN (GLUCOPHAGE) 1000 MG tablet Take 1,000 mg by mouth in the morning and at bedtime. 07/16/14   [provider]  Multiple Vitamin (MULTIVITAMIN WITH MINERALS) TABS tablet Take 1 tablet by mouth daily. 05/24/14   Rama, Maryruth Bun, MD  neomycin-bacitracin-polymyxin (NEOSPORIN) [phone] ointment Apply 1 Application topically daily as needed (wound care).    [provider]  OVER THE COUNTER MEDICATION Take 1 capsule by mouth daily. Balance of [employer], Historical, MD  OVER THE COUNTER MEDICATION Take 1 tablet by mouth daily. Beet supplement    [provider]  protein supplement shake (PREMIER PROTEIN) LIQD Take 11 oz by mouth daily.    [provider]  rosuvastatin (CRESTOR) 20 MG tablet TAKE 1 TABLET  BY MOUTH EVERY DAY 08/03/22   Corky Crafts, MD  SANTYL 250 UNIT/GM ointment Apply 1 Application topically daily. 02/10/22   [provider]  Tetrahydrozoline HCl (VISINE OP) Place 1 drop into both eyes daily as needed (burning).    [provider]  traMADol (ULTRAM) 50 MG tablet Take 2 tablets (100 mg total) by mouth every 6 (six) hours as needed. Every 4 hours 05/24/14   Rama, Maryruth Bun, MD  ULORIC 80 MG TABS Take 80 mg by mouth daily. 02/22/14   [provider]     Objective    Physical Exam: Vitals:   08/17/22 0007 08/17/22 0014 08/17/22 0304 08/17/22 0453  BP: (!) 125/98  Marland Kitchen)  141/93   Pulse: 95  100   Resp: 13  18   Temp: 98 F (36.7 C)   98.4 F (36.9 C)  TempSrc: Oral   Oral  SpO2: 97%  100%   Weight:  81.7 kg    Height:  5\' 8"  (1.727 m)      General: appears to be stated age; alert; confused.  Does not know the current year.  Knows that he is in the hot middle, but does not know why he is in the hospital.  Is able to correctly identify his own date of birth and full name, and knows his daughter including his daughter's full name. Skin: warm, dry, no rash Head:  AT/[place] Mouth:  Oral mucosa membranes appear dry, normal dentition Neck: supple; trachea midline Heart:  RRR; did not appreciate any M/R/G Lungs: CTAB, did not appreciate any wheezes, rales, or rhonchi Abdomen: + BS; soft, ND, NT Vascular: 2+ pedal pulses b/l; 2+ radial pulses b/l Extremities: no peripheral edema, left lower extremity wound associated the anterior aspect of the left lower extremity just proximal to the left ankle Neuro: strength and sensation intact in upper and lower extremities b/l    Labs on Admission: I have personally reviewed following labs and imaging studies  CBC: Recent Labs  Lab 08/17/22 0024 08/17/22 0143  WBC 6.6  --   NEUTROABS 5.1  --   HGB 14.5 14.6  HCT 44.6 43.0  MCV 94.3  --   PLT 179  --    Basic Metabolic Panel: Recent Labs  Lab  08/17/22 0024 08/17/22 0143 08/17/22 0356  NA 138 139  --   K 3.8 4.1  --   CL 104 107  --   CO2 18*  --   --   GLUCOSE 135* 133*  --   BUN 32* 38*  --   CREATININE 0.95 0.80  --   CALCIUM 9.5  --   --   MG  --   --  1.9   GFR: Estimated Creatinine Clearance: 65.3 mL/min (by C-G formula based on SCr of 0.8 mg/dL). Liver Function Tests: Recent Labs  Lab 08/17/22 0024  AST 47*  ALT 34  ALKPHOS 77  BILITOT 1.2  PROT 7.5  ALBUMIN 4.1   No results for input(s): "LIPASE", "AMYLASE" in the last 168 hours. No results for input(s): "AMMONIA" in the last 168 hours. Coagulation Profile: Recent Labs  Lab 08/17/22 0024  INR 1.1   Cardiac Enzymes: No results for input(s): "CKTOTAL", "CKMB", "CKMBINDEX", "TROPONINI" in the last 168 hours. BNP (last 3 results) No results for input(s): "PROBNP" in the last 8760 hours. HbA1C: No results for input(s): "HGBA1C" in the last 72 hours. CBG: No results for input(s): "GLUCAP" in the last 168 hours. Lipid Profile: No results for input(s): "CHOL", "HDL", "LDLCALC", "TRIG", "CHOLHDL", "LDLDIRECT" in the last 72 hours. Thyroid Function Tests: No results for input(s): "TSH", "T4TOTAL", "FREET4", "T3FREE", "THYROIDAB" in the last 72 hours. Anemia Panel: No results for input(s): "VITAMINB12", "FOLATE", "FERRITIN", "TIBC", "IRON", "RETICCTPCT" in the last 72 hours. Urine analysis:    Component Value Date/Time   COLORURINE YELLOW 08/17/2022 0421   APPEARANCEUR HAZY (A) 08/17/2022 0421   LABSPEC 1.030 08/17/2022 0421   PHURINE 5.0 08/17/2022 0421   GLUCOSEU NEGATIVE 08/17/2022 0421   HGBUR NEGATIVE 08/17/2022 0421   BILIRUBINUR NEGATIVE 08/17/2022 0421   KETONESUR 5 (A) 08/17/2022 0421   PROTEINUR 100 (A) 08/17/2022 0421   UROBILINOGEN 0.2 07/21/2014 1635   NITRITE  NEGATIVE 08/17/2022 0421   LEUKOCYTESUR NEGATIVE 08/17/2022 0421    Radiological Exams on Admission: CT Head Wo Contrast  Result Date: 08/17/2022 CLINICAL DATA:  Head  trauma, minor (Age >= 65y); Neck trauma (Age >= 65y). Fall EXAM: CT HEAD WITHOUT CONTRAST CT CERVICAL SPINE WITHOUT CONTRAST TECHNIQUE: Multidetector CT imaging of the head and cervical spine was performed following the standard protocol without intravenous contrast. Multiplanar CT image reconstructions of the cervical spine were also generated. RADIATION DOSE REDUCTION: This exam was performed according to the departmental dose-optimization program which includes automated exposure control, adjustment of the mA and/or kV according to patient size and/or use of iterative reconstruction technique. COMPARISON:  CT head 07/21/2014 FINDINGS: CT HEAD FINDINGS Brain: Cerebral ventricle sizes are concordant with the degree of cerebral volume loss. Patchy and confluent areas of decreased attenuation are noted throughout the deep and periventricular white matter of the cerebral hemispheres bilaterally, compatible with chronic microvascular ischemic disease. No evidence of large-territorial acute infarction. No parenchymal hemorrhage. No mass lesion. No extra-axial collection. No mass effect or midline shift. No hydrocephalus. Basilar cisterns are patent. Vascular: No hyperdense vessel. Atherosclerotic calcifications are present within the cavernous internal carotid arteries. Skull: No acute fracture or focal lesion. Sinuses/Orbits: Paranasal sinuses and mastoid air cells are clear. The orbits are unremarkable. Other: None. CT CERVICAL SPINE FINDINGS Alignment: Grade 1 anterolisthesis of C2 on C3. Skull base and vertebrae: Multilevel moderate degenerative changes of the spine. No associated severe osseous neural foraminal or central canal stenosis. No acute fracture. No aggressive appearing focal osseous lesion or focal pathologic process. Soft tissues and spinal canal: No prevertebral fluid or swelling. No visible canal hematoma. Upper chest: Emphysematous change. Other: Atherosclerotic plaque of the carotid arteries within  the neck. Patient is edentulous. IMPRESSION: 1. No acute intracranial abnormality. 2. No acute displaced fracture or traumatic listhesis of the cervical spine. Electronically Signed   By: Tish Frederickson M.D.   On: 08/17/2022 03:11   CT Cervical Spine Wo Contrast  Result Date: 08/17/2022 CLINICAL DATA:  Head trauma, minor (Age >= 65y); Neck trauma (Age >= 65y). Fall EXAM: CT HEAD WITHOUT CONTRAST CT CERVICAL SPINE WITHOUT CONTRAST TECHNIQUE: Multidetector CT imaging of the head and cervical spine was performed following the standard protocol without intravenous contrast. Multiplanar CT image reconstructions of the cervical spine were also generated. RADIATION DOSE REDUCTION: This exam was performed according to the departmental dose-optimization program which includes automated exposure control, adjustment of the mA and/or kV according to patient size and/or use of iterative reconstruction technique. COMPARISON:  CT head 07/21/2014 FINDINGS: CT HEAD FINDINGS Brain: Cerebral ventricle sizes are concordant with the degree of cerebral volume loss. Patchy and confluent areas of decreased attenuation are noted throughout the deep and periventricular white matter of the cerebral hemispheres bilaterally, compatible with chronic microvascular ischemic disease. No evidence of large-territorial acute infarction. No parenchymal hemorrhage. No mass lesion. No extra-axial collection. No mass effect or midline shift. No hydrocephalus. Basilar cisterns are patent. Vascular: No hyperdense vessel. Atherosclerotic calcifications are present within the cavernous internal carotid arteries. Skull: No acute fracture or focal lesion. Sinuses/Orbits: Paranasal sinuses and mastoid air cells are clear. The orbits are unremarkable. Other: None. CT CERVICAL SPINE FINDINGS Alignment: Grade 1 anterolisthesis of C2 on C3. Skull base and vertebrae: Multilevel moderate degenerative changes of the spine. No associated severe osseous neural  foraminal or central canal stenosis. No acute fracture. No aggressive appearing focal osseous lesion or focal pathologic process. Soft tissues and spinal canal:  No prevertebral fluid or swelling. No visible canal hematoma. Upper chest: Emphysematous change. Other: Atherosclerotic plaque of the carotid arteries within the neck. Patient is edentulous. IMPRESSION: 1. No acute intracranial abnormality. 2. No acute displaced fracture or traumatic listhesis of the cervical spine. Electronically Signed   By: Tish Frederickson M.D.   On: 08/17/2022 03:11   CT CHEST ABDOMEN PELVIS W CONTRAST  Result Date: 08/17/2022 CLINICAL DATA:  Polytrauma, blunt fall, abdominal pain, AMS, possible sepsis EXAM: CT CHEST, ABDOMEN, AND PELVIS WITH CONTRAST TECHNIQUE: Multidetector CT imaging of the chest, abdomen and pelvis was performed following the standard protocol during bolus administration of intravenous contrast. RADIATION DOSE REDUCTION: This exam was performed according to the departmental dose-optimization program which includes automated exposure control, adjustment of the mA and/or kV according to patient size and/or use of iterative reconstruction technique. CONTRAST:  OMNIPAQUE IOHEXOL 300 MG/ML  SOLN COMPARISON:  Chest x-ray 08/17/2022, x-ray pelvis 08/17/2022 FINDINGS: CHEST: Cardiovascular: No aortic injury. The thoracic aorta is normal in caliber. The heart is enlarged in size. No significant pericardial effusion. Severe atherosclerotic plaque. Four-vessel coronary calcification. Aortic valve leaflet calcification. Mediastinum/Nodes: No pneumomediastinum. No mediastinal hematoma. The esophagus is unremarkable.  Small hiatal hernia. The thyroid is unremarkable. The central airways are patent. No mediastinal, hilar, or axillary lymphadenopathy. Lungs/Pleura: Mild paraseptal emphysematous changes. Bilateral lower lobe atelectasis. Diffuse mild bronchial wall thickening. No focal consolidation. Subpleural left lower  lobe 9 x 7 mm pulmonary nodule. Adjacent 4 mm pulmonary nodule. No pulmonary mass. No pulmonary contusion or laceration. No pneumatocele formation. No pleural effusion. No pneumothorax. No hemothorax. Musculoskeletal/Chest wall: No chest wall mass. No acute rib or sternal fracture. No spinal fracture. Chronic T3 and T4 superior endplate compression fractures with associated Schmorl nodes. Bilateral total shoulder arthroplasties. ABDOMEN / PELVIS: Hepatobiliary: Not enlarged. Nodular hepatic contour. Diffusely hypodense hepatic parenchyma compared to the spleen. No focal lesion. No laceration or subcapsular hematoma. The gallbladder is otherwise unremarkable with no radio-opaque gallstones. No biliary ductal dilatation. Pancreas: Normal pancreatic contour. No main pancreatic duct dilatation. Spleen: Not enlarged. No focal lesion. No laceration, subcapsular hematoma, or vascular injury. Adrenals/Urinary Tract: No nodularity bilaterally. Bilateral kidneys enhance symmetrically. Bilateral renal cortical scarring. Subcentimeter hypodensities are too small to characterize-no further follow-up indicated. No hydronephrosis. No contusion, laceration, or subcapsular hematoma. No injury to the vascular structures or collecting systems. No hydroureter. The urinary bladder is unremarkable. Stomach/Bowel: No small or large bowel wall thickening or dilatation. Colonic diverticulosis. 8 cm rectal stool ball. The appendix is unremarkable. Vasculature/Lymphatics: Atherosclerotic plaque. The portal, splenic, superior mesenteric veins are patent. No abdominal aorta or iliac aneurysm. No active contrast extravasation or pseudoaneurysm. No abdominal, pelvic, inguinal lymphadenopathy. Reproductive: Normal. Other: No simple free fluid ascites. No pneumoperitoneum. No hemoperitoneum. No mesenteric hematoma identified. No organized fluid collection. Musculoskeletal: No significant soft tissue hematoma. Small fat containing umbilical hernia.  No acute pelvic fracture. No spinal fracture. Multilevel severe degenerative changes spine with multilevel severe intervertebral disc space narrowing and vacuum phenomenon. Disc bulge at the L3 and L4 and L4-L5 levels. Grade 1 anterolisthesis of L3 on L4 and L4 on L5 (total of 6 lumbar bodies). Total right hip arthroplasty partially visualized. Plate and screw fixation of the proximal femur partially visualized. Ports and Devices: None. IMPRESSION: 1. No acute intrathoracic, intra-abdominal, intrapelvic traumatic injury. 2. No acute fracture or traumatic malalignment of the thoracic or lumbar spine. Other imaging findings of potential clinical significance: 1. Aortic Atherosclerosis (ICD10-I70.0) and Emphysema (ICD10-J43.9). 2. Multiple  pulmonary nodules. Most significant: Left solid pulmonary nodule measuring 8 mm.Per Fleischner Society Guidelines, recommend a non-contrast Chest CT at 3-6 months, then another non-contrast Chest CT at 18-24 months. These guidelines do not apply to immunocompromised patients and patients with cancer. Follow up in patients with significant comorbidities as clinically warranted. For lung cancer screening, adhere to Lung-RADS guidelines. Reference: Radiology. 2017; 284(1):228-43. 3. Cardiomegaly. 4. Small hiatal hernia. 5. Cirrhosis with no definite finding of portal hypertension. No focal liver lesions identified. Please note that liver protocol enhanced MR and CT are the most sensitive tests for the screening detection of hepatocellular carcinoma in the high risk setting of cirrhosis. 6. Colonic diverticulosis with no acute diverticulitis. 7. An 8 cm rectal stool ball. Electronically Signed   By: Tish Frederickson M.D.   On: 08/17/2022 03:08   DG HIPS BILAT WITH PELVIS 3-4 VIEWS  Result Date: 08/17/2022 CLINICAL DATA:  [id number] Fall [id number] EXAM: DG HIP (WITH OR WITHOUT PELVIS) 3-4V BILAT COMPARISON:  CT abdomen pelvis 05/21/2014 FINDINGS: Total right hip arthroplasty. Plate and screw  fixation of the proximal left femur partially visualized. No radiographic findings suggest surgical hardware complication. There is no evidence of hip fracture or dislocation of both hips. No acute displaced fracture or diastasis of the bones of the pelvis. There is no evidence of arthropathy or other focal bone abnormality. Vascular calcification. IMPRESSION: Negative for acute traumatic injury. Electronically Signed   By: Tish Frederickson M.D.   On: 08/17/2022 01:24   DG Chest Port 1 View  Result Date: 08/17/2022 CLINICAL DATA:  Questionable sepsis - evaluate for abnormality EXAM: PORTABLE CHEST 1 VIEW COMPARISON:  Chest x-ray 07/22/2014, CT chest 05/11/2014 FINDINGS: The heart and mediastinal contours are within normal limits. Atherosclerotic plaque. Low lung volumes. No focal consolidation. No pulmonary edema. No pleural effusion. No pneumothorax. No acute osseous abnormality. Bilateral total shoulder arthroplasty. IMPRESSION: 1. Low lung volumes with no active disease. 2.  Aortic Atherosclerosis (ICD10-I70.0). Electronically Signed   By: Tish Frederickson M.D.   On: 08/17/2022 01:15      Assessment/Plan    Principal Problem:   Acute metabolic encephalopathy Active Problems:   Diabetes mellitus type 2, controlled (HCC)   Essential hypertension   AKI (acute kidney injury) (HCC)   Generalized weakness   Dehydration   High anion gap metabolic acidosis   Lactic acidosis   Pulmonary nodule   Atrial fibrillation, chronic (HCC)   Chronic alcohol abuse      #) Acute metabolic encephalopathy: 2 to 3 days of confusion relative to baseline mental status, with suspected metabolic contributions, including contributions from dehydration, acute kidney injury resulting in diminished renal clearance of outpatient medications that can be associated with central acting implications, including home Lyrica.   No overt evidence of underlying infectious process at this time, including urinalysis which was  inconsistent with UTI, We will no overt evidence of infection on CT chest, abdomen, pelvis.  Patient does have a history of septic arthritis involving the right hip, complicated by strep bacteremia, although there is a low index of suspicion for this at the present time, as he denies any recent acute arthralgias, including no recent right hip discomfort.  Will add on ESR, CRP to further evaluate, will also monitoring for results of blood cultures x 2 collected in the ED today.  No overt acute focal neurologic deficits to suggest a contribution from an underlying acute CVA, while noting presenting CT head shows no evidence of acute process. Seizures are also  felt to be less likely. Will check VBG to evaluate for any contribution from hypercapnic encephalopathy, noting that the patient has a history of being a former smoker.  Also check CPK level in the context of outpatient use of high intensity rosuvastatin.  Will also check ammonia level in the context of chronic alcohol abuse.   Plan: fall precautions. Repeat CMP/CBC in the AM. Check Mg level. check TSH, vbg.  Hold outpatient Lyrica.  Further evaluation management of dehydration/acute kidney injury, as further detailed below.  Delirium precautions.  Check ammonia, B12, CPK level, urinary drug screen, ESR, CRP.  Monitor for results of blood cultures x 2.  IV fluids, as further detailed below.  Repeat BMP, CBC in the morning.                   #) Generalized weakness: At least 2 to 3-day duration of generalized weakness, in the absence of any evidence of acute focal neurologic deficits, including no evidence of acute focal weakness to suggest acute CVA, vomiting CT head shows no evidence of acute process.  Suspect contribution from physiologic stress stemming from presenting dehydration, acute kidney injury, as above.  No e/o additional infectious process at this time, as further detailed above..   Will further eval for any additional  contributions from endocrine/metabolic sources, as detailed below.    Plan: work-up and management of presenting dehydration, acute kidney injury, as described above.  IV fluids, as above.  Hold home Lyrica for now.  PT/OT consults ordered for the AM. Fall precautions. CMP/CBC in the AM. Check TSH, serum Mg level. Check B12 level.  Check CPK level.Marland Kitchen                     #) Dehydration: Clinical suspicion for such, including the appearance of dry oral mucous membranes as well as laboratory findings notable for acute prerenal azotemia and UA demonstrating elevated specific gravity.  Suspect that this is in the setting of recent decline in oral intake while the patient's daughter was away.  no e/o associated hypotension.  Suspect pharmacologic exacerbation via outpatient Lasix.  Will provide gentle IV fluids, will closely monitor Rensing volume status, noting that the patient is at increased risk for ensuing development of volume overload given his history of moderate mitral regurgitation.   Plan: Monitor strict I's and O's.  Daily weights.  CMP in the morning. IVF's in form of lactated Ringer's at 75 cc/h x 12 hours.  Hold home Lasix for now.                 #) Acute Kidney Injury:  as quantified above.  This appears to be prerenal in nature in the context of recent decline in oral intake, as further detailed above, with potential pharmacologic exacerbation from outpatient Lasix as well as irbesartan.  Additionally, he is on scheduled Mobic as an outpatient.  Home Lyrica is also noted.  Presenting urinalysis with microscopy shows no evidence of white blood cell red blood cell casts but does show 100 protein.  Plan: monitor strict I's & O's and daily weights. Attempt to avoid nephrotoxic agents.  Hold home Lyrica , irbesartan, Lasix for now.  Hold home Mobic for now.  Refrain from additional NSAIDs. Repeat CMP in the morning. Check serum magnesium level. Add-on random urine  sodium and random urine creatinine.  IV fluids, as further detailed below.  Check CPK level.                 #)  Anion gap metabolic acidosis: Noted on presenting CMP, as quantified above.  Suspect that this is multifactorial in nature, with contributions from initial lactic acidosis, now resolved with interval IV fluids, including contributions from dehydration as well as acute kidney injury.  Additionally, his urinalysis showed the presence of 5 ketones, suggestive of an element of either starvation keto lactic acidosis given diminished oral intake with the patient's daughter was away versus alcoholic keto lactic acidosis given a history of chronic alcohol abuse.   Plan: IV fluids, as above.  Further evaluation management of AKI, as above.  Repeat BMP in the morning.  Check CPK level.  Check urinary drug screen, CRP, ESR.  Hold home metformin for now.               #) Left-sided pulmonary nodule: Noted on today CT chest, with left-sided pulmonary nodule measuring 8 mm.  Radiology conveys associated recommendations for a noncontrast CT chest in 3 to 6 months followed by another noncontrast CT chest in 18 to 24 months.  Plan: Per formal radiology recommendations, will convey recommendations for repeat CT chest in 3 to 6 months followed by CT chest in 18 to 24 months, as above.                 #) Chronic atrial fibrillation: Documented history of such. In setting of CHA2DS2-VASc score of 6, there is an indication for chronic anticoagulation for thromboembolic prophylaxis.  However, in the setting of being a recurrent fall risk, the patient is not on formal anticoagulation as an outpatient.  Home AV nodal blocking regimen: Atenolol.  Most recent echocardiogram was performed in May 2016, with results notable for LVEF 6065%, no focal motion abnormalities, indeterminate diastolic parameters, trivial aortic regurgitation, moderate mitral regurgitation, moderate tricuspid  regurgitation, mildly dilated right atrium. Presenting EKG demonstrates rate controlled atrial fibrillation without overt evidence of acute ischemic changes.   Plan: monitor strict I's & O's and daily weights. CMP/CBC in AM. Check serum mag level. Continue home AV nodal blocking regimen.                 #) Chronic Alcohol Abuse: documented history of such, with the patient reportedly consuming at least 4-6 beers per day, although the timing of most recent alcohol consumption is not entirely clear at this time, while noting serum ethanol level less than 10 this evening.  Patient states that he has no desire to discontinue alcohol consumption, and daughter conveys that typically, when the patient is hospitalized, that he receives beer during the hospitalization to prevent alcohol withdrawal.   No evidence of hallucinations or overt clinical evidence to suggest active alcohol withdrawal at this time.  Will refrain from initiation of CIWA protocol for now, with consideration for initiation of beer with meals to prevent alcohol withdrawal in this patient that is not interested in alcohol discontinuation.  Plan: Consult to transition of care team placed. Close monitoring of ensuing BP and HR via routine VS. Seizure precautions. Telemetry. Add-on serum Mg level.  Repeat CMP in the morning.  Thiamine 100 mg IV daily, with first dose now.  Daily folic acid and multivitamin.                 #) Type 2 Diabetes Mellitus: documented history of such. Home insulin regimen: None. Home oral hypoglycemic agents: Metformin. presenting blood sugar: 135. Most recent A1c noted to be 5.8% when checked in August 2023.  Complicated by diabetic peripheral polyneuropathy, for which she is  on Lyrica as an outpatient.  In the setting of presenting acute kidney injury as well as acute encephalopathy, will hold home Lyrica for now.  Plan: accuchecks QAC and HS with low dose SSI. hold home oral hypoglycemic  agents during this hospitalization.  Add on hemoglobin A1c level.  Hold home Lyrica for now, as above.                  #) Essential Hypertension: documented h/o such, with outpatient antihypertensive regimen including atenolol, Lasix, hydralazine, irbesartan.  SBP's in the ED today: 120s to 140s mmHg.   Plan: Close monitoring of subsequent BP via routine VS. in the setting of acute kidney injury, dehydration, will hold home irbesartan and Lasix for now.  Continue home atenolol and hydralazine.  Monitor strict I's and O's and daily weights.      DVT prophylaxis: SCD's   Code Status: Full code Family Communication: case d/w daughter, as further detailed above Disposition Plan: Per Rounding Team Consults called: none;  Admission status: inpatient    I SPENT GREATER THAN 75  MINUTES IN CLINICAL CARE TIME/MEDICAL DECISION-MAKING IN COMPLETING THIS ADMISSION.     Chaney Born Ronak Duquette DO [facility] From 7PM - 7AM   08/17/2022, 5:52 AM

## 2022-08-18 DIAGNOSIS — E1142 Type 2 diabetes mellitus with diabetic polyneuropathy: Secondary | ICD-10-CM | POA: Diagnosis not present

## 2022-08-18 DIAGNOSIS — G9341 Metabolic encephalopathy: Secondary | ICD-10-CM | POA: Diagnosis not present

## 2022-08-18 DIAGNOSIS — I10 Essential (primary) hypertension: Secondary | ICD-10-CM | POA: Diagnosis not present

## 2022-08-18 DIAGNOSIS — I482 Chronic atrial fibrillation, unspecified: Secondary | ICD-10-CM | POA: Diagnosis not present

## 2022-08-18 LAB — GLUCOSE, CAPILLARY
Glucose-Capillary: 143 mg/dL — ABNORMAL HIGH (ref 70–99)
Glucose-Capillary: 110 mg/dL — ABNORMAL HIGH (ref 70–99)
Glucose-Capillary: 135 mg/dL — ABNORMAL HIGH (ref 70–99)
Glucose-Capillary: 125 mg/dL — ABNORMAL HIGH (ref 70–99)

## 2022-08-18 LAB — MRSA NEXT GEN BY PCR, NASAL: MRSA by PCR Next Gen: NOT DETECTED

## 2022-08-18 MED ORDER — IRBESARTAN 150 MG PO TABS
150.0000 mg | ORAL_TABLET | Freq: Every day | ORAL | Status: DC
  Administered 2022-08-18 – 2022-08-19 (×2): 150 mg via ORAL
  Filled 2022-08-18 (×2): qty 1

## 2022-08-18 MED ORDER — FUROSEMIDE 20 MG PO TABS
20.0000 mg | ORAL_TABLET | Freq: Every day | ORAL | Status: DC
  Administered 2022-08-18 – 2022-08-19 (×2): 20 mg via ORAL
  Filled 2022-08-18 (×2): qty 1

## 2022-08-18 MED ORDER — TAMSULOSIN HCL 0.4 MG PO CAPS
0.4000 mg | ORAL_CAPSULE | Freq: Every day | ORAL | Status: DC
  Administered 2022-08-19: 0.4 mg via ORAL
  Filled 2022-08-18: qty 1

## 2022-08-18 NOTE — TOC Initial Note (Signed)
Transition of Care [facility]) - Initial/Assessment Note    Patient Details  Name: [name] MRN: [id number] Date of Birth: 1935/06/16  Transition of Care [facility]) CM/SW Contact:    Larrie Kass, LCSW Phone Number: 08/18/2022, 10:41 AM  Clinical Narrative:                  CSW met with pt regarding rec for short term rehab. Pt stated he has never been and would like to try it out. CSW explained the process, pt will need insurance authorization. CSW to work pt up for SNF placement.   Expected Discharge Plan: Skilled Nursing Facility Barriers to Discharge: Continued Medical Work up   Patient Goals and CMS Choice Patient states their goals for this hospitalization and ongoing recovery are:: short term rehab          Expected Discharge Plan and Services In-house Referral: Clinical Social Work     Living arrangements for the past 2 months: Single Family Home                                      Prior Living Arrangements/Services Living arrangements for the past 2 months: Single Family Home Lives with:: Self, Adult Children Patient language and need for interpreter reviewed:: Yes Do you feel safe going back to the place where you live?: Yes      Need for Family Participation in Patient Care: No (Comment) Care giver support system in place?: No (comment)   Criminal Activity/Legal Involvement Pertinent to Current Situation/Hospitalization: No - Comment as needed  Activities of Daily Living Home Assistive Devices/Equipment: Walker (specify type), Other (Comment) (walking stick) ADL Screening (condition at time of admission) Patient's cognitive ability adequate to safely complete daily activities?: Yes Is the patient deaf or have difficulty hearing?: Yes Does the patient have difficulty seeing, even when wearing glasses/contacts?: Yes Does the patient have difficulty concentrating, remembering, or making decisions?: Yes Patient able to express need for assistance  with ADLs?: Yes Does the patient have difficulty dressing or bathing?: Yes Independently performs ADLs?: No Communication: Independent Dressing (OT): Needs assistance Is this a change from baseline?: Change from baseline, expected to last <3days Grooming: Needs assistance Is this a change from baseline?: Pre-admission baseline Feeding: Needs assistance Is this a change from baseline?: Change from baseline, expected to last <3 days Bathing: Needs assistance Is this a change from baseline?: Change from baseline, expected to last <3 days Toileting: Needs assistance Is this a change from baseline?: Change from baseline, expected to last <3 days In/Out Bed: Needs assistance Is this a change from baseline?: Change from baseline, expected to last <3 days Walks in Home: Needs assistance Does the patient have difficulty walking or climbing stairs?: No Weakness of Legs: Both Weakness of Arms/Hands: Both  Permission Sought/Granted                  Emotional Assessment Appearance:: Appears stated age Attitude/Demeanor/Rapport: Engaged, Gracious Affect (typically observed): Accepting Orientation: : Oriented to Self, Oriented to Place, Oriented to  Time, Oriented to Situation Alcohol / Substance Use: Alcohol Use Psych Involvement: No (comment)  Admission diagnosis:  Dehydration [E86.0] Disorientation [R41.0] Acute encephalopathy [G93.40] Patient Active Problem List   Diagnosis Date Noted   Acute encephalopathy 08/17/2022   Acute metabolic encephalopathy 08/17/2022   Generalized weakness 08/17/2022   Dehydration 08/17/2022   High anion gap metabolic acidosis 08/17/2022  Lactic acidosis 08/17/2022   Pulmonary nodule 08/17/2022   Atrial fibrillation, chronic (HCC) 08/17/2022   Chronic alcohol abuse 08/17/2022   Ulcer of heel, right, with fat layer exposed (HCC)    Critical limb ischemia of both lower extremities (HCC) 10/09/2021   Critical limb ischemia of right lower extremity  (HCC) 10/09/2021   Acute osteomyelitis of calcaneum, right (HCC) 08/07/2021   Type 2 diabetes mellitus with vascular disease (HCC) 08/12/2020   AKI (acute kidney injury) (HCC) 07/21/2014   Transient hypotension 07/21/2014   Septic arthritis of hip (HCC) 05/18/2014   SDH (subdural hematoma) (HCC) 05/12/2014   Cerebral infarction due to embolism of left middle cerebral artery (HCC)    Streptococcal bacteremia 05/09/2014   Right hip pain    Alcoholism (HCC)    Persistent atrial fibrillation (HCC) 05/04/2014   Diabetes mellitus type 2, controlled (HCC) 05/04/2014   Essential hypertension 05/04/2014   Gout 05/04/2014   Status post total hip replacement, right 09/26/2013   Presence of unspecified artificial knee joint 10/15/2010   Fracture of left femur (HCC) 10/15/2010   PCP:  Darrow Bussing, MD Pharmacy:   [id number] - [place], [place] - [address] [place] [address] Phone: [phone] Fax: [phone]  [place] [facility] [address] [place] [address] Phone: [phone] Fax: [phone]     Social Determinants of Health (SDOH) Social History: SDOH Screenings   Food Insecurity: No Food Insecurity (08/18/2022)  Housing: Low Risk  (08/18/2022)  Transportation Needs: No Transportation Needs (08/18/2022)  Utilities: Not At Risk (08/18/2022)  Depression (PHQ2-9): Low Risk  (08/06/2021)  Tobacco Use: Medium Risk (08/17/2022)   SDOH Interventions:     Readmission Risk Interventions    10/12/2021   10:53 AM  Readmission Risk Prevention Plan  Post Dischage Appt Complete  Medication Screening Complete  Transportation Screening Complete

## 2022-08-18 NOTE — Evaluation (Signed)
Occupational Therapy Evaluation Patient Details Name: [name] MRN: [id number] DOB: July 26, 1935 Today's Date: 08/18/2022   History of Present Illness 87 yo male admitted with acute metabolic encephalopathy, weakness. Hx of knee replacements, hip replacement, foot wounds, DM, chronic alcoholism, gout, CVA, Afib, neuropathy, septic arthritis, SDH   Clinical Impression   This 87 yo male admitted with above presents to acute OT with PLOF per chart and patient report that he was able to bath, dress, toilet, and cook for himself. Currently he has very rigid posture with attempt to roll in bed, does not A but also does not resist rolling in bed, not wanting to eat when offered to help set up tray for him in bed, and incontinent of bowel in bed. Pt cleaned up, new bed pads placed, new sacral pad placed by NT, heels elevated and pillow placed between his knees due to boney prominences. He will continue to benefit from acute OT with follow up from continued inpatient follow up therapy <3 hours/day.        If plan is discharge home, recommend the following: Two people to help with walking and/or transfers;Two people to help with bathing/dressing/bathroom;Assistance with feeding;Assistance with cooking/housework;Assist for transportation;Direct supervision/assist for financial management;Direct supervision/assist for medications management    Functional Status Assessment  Patient has had a recent decline in their functional status and demonstrates the ability to make significant improvements in function in a reasonable and predictable amount of time.  Equipment Recommendations  Other (comment) (TBD next venue)       Precautions / Restrictions Precautions Precautions: Fall Restrictions Weight Bearing Restrictions: No      Mobility Bed Mobility Overal bed mobility: Needs Assistance Bed Mobility: Rolling Rolling: Total assist (pt awake but not attempting to help roll)                       ADL either performed or assessed with clinical judgement   ADL Overall ADL's : Needs assistance/impaired Eating/Feeding: Minimal assistance;Bed level   Grooming: Moderate assistance;Bed level   Upper Body Bathing: Moderate assistance;Bed level   Lower Body Bathing: Total assistance;Bed level   Upper Body Dressing : Maximal assistance;Bed level   Lower Body Dressing: Total assistance;Bed level                       Vision Patient Visual Report: No change from baseline              Pertinent Vitals/Pain Pain Assessment Pain Assessment: Faces Faces Pain Scale: Hurts little more Pain Location: left ankle when touched Pain Descriptors / Indicators: Discomfort, Grimacing, Guarding Pain Intervention(s): Limited activity within patient's tolerance, Monitored during session, Repositioned     Extremity/Trunk Assessment Upper Extremity Assessment Upper Extremity Assessment: Generalized weakness           Communication Communication Communication: No apparent difficulties   Cognition Arousal: Alert Behavior During Therapy: WFL for tasks assessed/performed Overall Cognitive Status: No family/caregiver present to determine baseline cognitive functioning Area of Impairment: Orientation                 Orientation Level: Disoriented to, Time, Situation ("I don't know" for year; Oct, Nov, Dec for year; "to get my hair cut" as to why he is at [facility])           Problem Solving: Requires verbal cues, Difficulty sequencing, Decreased initiation, Slow processing  Home Living Family/patient expects to be discharged to:: Private residence Living Arrangements: Children Available Help at Discharge: Family;Available 24 hours/day (daughter works from home) Type of Home: House Home Access: Elevator     Home Layout: Multi-level Alternate Level Stairs-Number of Steps: Consulting civil engineer Shower/Tub: Producer, television/film/video:  Handicapped height     Home Equipment: Rollator (4 wheels);Cane - single point;Shower seat - built in;Grab bars - tub/shower;Hand held shower head;Wheelchair - manual;Other (comment);Grab bars - toilet;Adaptive equipment Adaptive Equipment: Reacher;Sock aid;Long-handled shoe horn Additional Comments: lives with daughter on his own floor, pt reports he cooks for himself (? accuracy as pt is only oriented to self and place today)      Prior Functioning/Environment Prior Level of Function : Independent/Modified Independent             Mobility Comments: amb household distances with rollator. Rides electric scooter in grocery store. Family drives pt to store and doctor appts. ADLs Comments: Pt is Mod I with basic ADLs using AE prn        OT Problem List: Decreased range of motion;Decreased activity tolerance;Impaired balance (sitting and/or standing);Decreased coordination         OT Goals(Current goals can be found in the care plan section) Acute Rehab OT Goals Patient Stated Goal: to be pulled up in bed OT Goal Formulation: Patient unable to participate in goal setting Time For Goal Achievement: 09/01/22 Potential to Achieve Goals: Fair         AM-PAC OT "6 Clicks" Daily Activity     Outcome Measure Help from another person eating meals?: A Lot Help from another person taking care of personal grooming?: A Lot Help from another person toileting, which includes using toliet, bedpan, or urinal?: Total Help from another person bathing (including washing, rinsing, drying)?: A Lot Help from another person to put on and taking off regular upper body clothing?: A Lot Help from another person to put on and taking off regular lower body clothing?: Total 6 Click Score: 10   End of Session Nurse Communication: Other (comment) (I inadvertantly turned off the bed alarm and could not get it to reset--RN Kendal Hymen) sitting not too far from his room said she would fix it.)  Activity  Tolerance:  (limited by only wanting to be pulled up in bed, not wanting to roll to get cleaned up nor wanting to sit up to eat. Pt was cleaned up at bed level with clean bed pads placed) Patient left: in bed;with call bell/phone within reach (bed alarm not set (see Nurse communication as to why()  OT Visit Diagnosis: Other abnormalities of gait and mobility (R26.89);Muscle weakness (generalized) (M62.81);Other symptoms and signs involving the nervous system (R29.898)                Time: 3244-0102 OT Time Calculation (min): 20 min Charges:  OT General Charges $OT Visit: 1 Visit OT Evaluation $OT Eval Moderate Complexity: 1 Mod  Cathy L. OT Acute Rehabilitation Services Office [phone]    Evette Georges 08/18/2022, 1:22 PM

## 2022-08-18 NOTE — [place]2 (Signed)
[employer] MEDICA[place] FL2 LEVEL OF CARE FORM     IDENTIFICATION  Patient Name: [name] Birthdate: 1935-06-08 Sex: male Admission Date (Current Location): 08/17/2022  [facility] and [place] Number:  Producer, television/film/video and Address:  [facility],  [address] [place], [place] [address]      Provider Number: [id number]  Attending Physician Name and Address:  Marinda Elk, MD  Relative Name and Phone Number:       Current Level of Care: Hospital Recommended Level of Care: Skilled Nursing Facility Prior Approval Number:    Date Approved/Denied:   PASRR Number: [id number]  Discharge Plan: SNF    Current Diagnoses: Patient Active Problem List   Diagnosis Date Noted   Acute encephalopathy 08/17/2022   Acute metabolic encephalopathy 08/17/2022   Generalized weakness 08/17/2022   Dehydration 08/17/2022   High anion gap metabolic acidosis 08/17/2022   Lactic acidosis 08/17/2022   Pulmonary nodule 08/17/2022   Atrial fibrillation, chronic (HCC) 08/17/2022   Chronic alcohol abuse 08/17/2022   Ulcer of heel, right, with fat layer exposed (HCC)    Critical limb ischemia of both lower extremities (HCC) 10/09/2021   Critical limb ischemia of right lower extremity (HCC) 10/09/2021   Acute osteomyelitis of calcaneum, right (HCC) 08/07/2021   Type 2 diabetes mellitus with vascular disease (HCC) 08/12/2020   AKI (acute kidney injury) (HCC) 07/21/2014   Transient hypotension 07/21/2014   Septic arthritis of hip (HCC) 05/18/2014   SDH (subdural hematoma) (HCC) 05/12/2014   Cerebral infarction due to embolism of left middle cerebral artery (HCC)    Streptococcal bacteremia 05/09/2014   Right hip pain    Alcoholism (HCC)    Persistent atrial fibrillation (HCC) 05/04/2014   Diabetes mellitus type 2, controlled (HCC) 05/04/2014   Essential hypertension 05/04/2014   Gout 05/04/2014   Status post total hip replacement, right 09/26/2013   Presence of unspecified  artificial knee joint 10/15/2010   Fracture of left femur (HCC) 10/15/2010    Orientation RESPIRATION BLADDER Height & Weight     Self, Time, Situation, Place  Normal Incontinent Weight: 149 lb 4 oz (67.7 kg) Height:  5\' 8"  (172.7 cm)  BEHAVIORAL SYMPTOMS/MOOD NEUROLOGICAL BOWEL NUTRITION STATUS      Continent Diet (regular)  AMBULATORY STATUS COMMUNICATION OF NEEDS Skin   Limited Assist Verbally Other (Comment) (non pressure wound pretibial distal left)                       Personal Care Assistance Level of Assistance  Bathing, Feeding, Dressing Bathing Assistance: Limited assistance Feeding assistance: Independent Dressing Assistance: Limited assistance     Functional Limitations Info  Sight, Hearing, Speech Sight Info: Adequate Hearing Info: Adequate Speech Info: Adequate    SPECIAL CARE FACTORS FREQUENCY  PT (By licensed PT), OT (By licensed OT)     PT Frequency: 5 x a week OT Frequency: 5 x a wwek            Contractures Contractures Info: Not present    Additional Factors Info  Code Status, Allergies Code Status Info: full Allergies Info: Allopurinol  Atorvastatin  Motrin (Ibuprofen)  Latex           Current Medications (08/18/2022):  This is the current hospital active medication list Current Facility-Administered Medications  Medication Dose Route Frequency Provider Last Rate Last Admin   acetaminophen (TYLENOL) tablet 650 mg  650 mg Oral Q6H PRN Howerter, Justin B, DO  Or   acetaminophen (TYLENOL) suppository 650 mg  650 mg Rectal Q6H PRN Howerter, Justin B, DO       aspirin EC tablet 81 mg  81 mg Oral QODAY Howerter, Justin B, DO       atenolol (TENORMIN) tablet 50 mg  50 mg Oral Daily Howerter, Justin B, DO   50 mg at 08/18/22 0915   clopidogrel (PLAVIX) tablet 75 mg  75 mg Oral Daily Marinda Elk, MD   75 mg at 08/18/22 0915   fluticasone (FLONASE) 50 MCG/ACT nasal spray 2 spray  2 spray Each Nare Daily Howerter, Justin B, DO        folic acid (FOLVITE) tablet 1 mg  1 mg Oral Daily Howerter, Justin B, DO   1 mg at 08/18/22 0915   furosemide (LASIX) tablet 20 mg  20 mg Oral Daily Marinda Elk, MD   20 mg at 08/18/22 0915   hydrALAZINE (APRESOLINE) tablet 25 mg  25 mg Oral Q8H Howerter, Justin B, DO   25 mg at 08/18/22 0507   insulin aspart (novoLOG) injection 0-15 Units  0-15 Units Subcutaneous TID WC Marinda Elk, MD   3 Units at 08/18/22 0830   insulin aspart (novoLOG) injection 0-5 Units  0-5 Units Subcutaneous QHS Marinda Elk, MD       insulin aspart (novoLOG) injection 4 Units  4 Units Subcutaneous TID WC Marinda Elk, MD   4 Units at 08/18/22 0831   irbesartan (AVAPRO) tablet 150 mg  150 mg Oral Daily Marinda Elk, MD   150 mg at 08/18/22 0915   melatonin tablet 3 mg  3 mg Oral QHS PRN Howerter, Justin B, DO   3 mg at 08/17/22 2341   multivitamin (PROSIGHT) tablet 1 tablet  1 tablet Oral Daily Howerter, Justin B, DO   1 tablet at 08/18/22 0915   ondansetron (ZOFRAN) injection 4 mg  4 mg Intravenous Q6H PRN Howerter, Justin B, DO       rosuvastatin (CRESTOR) tablet 20 mg  20 mg Oral Daily Howerter, Justin B, DO   20 mg at 08/18/22 0915   [START ON 08/19/2022] tamsulosin (FLOMAX) capsule 0.4 mg  0.4 mg Oral QPC breakfast Marinda Elk, MD       thiamine (VITAMIN B1) injection 100 mg  100 mg Intravenous Q24H Howerter, Justin B, DO   100 mg at 08/18/22 0507     Discharge Medications: Please see discharge summary for a list of discharge medications.  Relevant Imaging Results:  Relevant Lab Results:   Additional Information SSN :[id number]  Valentina Shaggy Josph [place], LCSW

## 2022-08-18 NOTE — Progress Notes (Addendum)
[facility] PROGRESS NOTE    Progress Note  [name]  ZOX:[id number] DOB: [birth year] DOA: 08/17/2022 PCP: Darrow Bussing, MD     Brief Narrative:   [name] is an 87 y.o. male past medical history significant for chronic atrial fibrillation not on anticoagulation, alcohol abuse, type 2 diabetes mellitus essential hypertension, chronic left lower extremity wound admitted to [facility] for acute metabolic encephalopathy, the daughter relates that the patient has been having generalized weakness and the episodes of acute focal weaknesses she has remained afebrile UA shows no signs of infection with elevated specific gravity, no leukocytosis afebrile  Assessment/Plan:   Acute metabolic encephalopathy: Probably due to polypharmacy in the setting of dehydration.  There are no signs of infection. She has remained afebrile no leukocytosis imaging showed no acute findings. Cultures have been negative till date. Ammonia level unremarkable B12 unremarkable CK unremarkable ESR and CRP are unremarkable. PT evaluated the patient will need skilled nursing facility. Awaiting skilled nursing facility placement.  Generalized weakness: Started on IV fluids, holding Lyrica. PT OT has been consulted.  Dehydration: BUN elevated lactic acid mildly elevated on admission, along with a elevated specific gravity. Was started on IV fluid holding Lasix. She is not acute kidney injury, continue IV fluids.  High anion gap metabolic acidosis: Likely due to lactic acid she was placed on IV fluids now resolved.  Left-sided pulmonary nodule: Follow-up as an outpatient.  Chronic atrial fibrillation: With a chads Vascor of at least 5, not a candidate for anticoagulation due to recurrent falls. Continue Tylenol.  Chronic alcohol abuse: Seizure precaution started on thiamine and folate.  Diabetes mellitus type 2: Hemoglobin A1c 6.4 continue sliding scale insulin blood glucose  relatively well-controlled.  Essential hypertension: Blood pressure is significantly elevated hold ACE inhibitor and Lasix. Continue atenolol.  Pulmonary nodule Follow-up with PCP as an outpatient.  Unstageable sacral decubitus ulcer present on admission RN Pressure Injury Documentation: Pressure Ulcer 07/21/14 Unstageable - Full thickness tissue loss in which the base of the ulcer is covered by slough (yellow, tan, gray, green or brown) and/or eschar (tan, brown or black) in the wound bed. 4 x 2.5 (Active)  07/21/14   Location: Heel  Location Orientation: Right  Staging: Unstageable - Full thickness tissue loss in which the base of the ulcer is covered by slough (yellow, tan, gray, green or brown) and/or eschar (tan, brown or black) in the wound bed.  Wound Description (Comments): 4 x 2.5  Present on Admission: Yes     Pressure Ulcer 07/21/14 Unstageable - Full thickness tissue loss in which the base of the ulcer is covered by slough (yellow, tan, gray, green or brown) and/or eschar (tan, brown or black) in the wound bed. 4x 2.5 (Active)  07/21/14 2135  Location: Heel  Location Orientation: Left  Staging: Unstageable - Full thickness tissue loss in which the base of the ulcer is covered by slough (yellow, tan, gray, green or brown) and/or eschar (tan, brown or black) in the wound bed.  Wound Description (Comments): 4x 2.5  Present on Admission: Yes    DVT prophylaxis: lovenox Family Communication: I was unsuccessful calling daughter Status is: Inpatient Remains inpatient appropriate because: Acute metabolic encephalopathy    Code Status:     Code Status Orders  (From admission, onward)           Start     Ordered   08/17/22 0512  Full code  Continuous       Question:  By:  Answer:  Consent: discussion documented in EHR   08/17/22 0511           Code Status History     Date Active Date Inactive Code Status Order ID Comments User Context   03/18/2022 1436  03/18/2022 2135 Full Code [id number]  Victorino Sparrow, MD Inpatient   10/09/2021 1351 10/12/2021 1845 Full Code [id number]  Dory Horn Inpatient   09/24/2021 1342 09/24/2021 2040 Full Code [id number]  Victorino Sparrow, MD Inpatient   07/21/2014 2104 07/24/2014 1453 Full Code [id number]  Therisa Doyne, MD Inpatient   05/16/2014 2000 05/24/2014 1447 Full Code [id number]  Gilmer Mor, DO Inpatient   05/04/2014 2357 05/16/2014 2000 Full Code [id number]  Eduard Clos, MD Inpatient         IV Access:   Peripheral IV   Procedures and diagnostic studies:   DG Knee 1-2 Views Left  Result Date: 08/17/2022 CLINICAL DATA:  Medial knee pain EXAM: LEFT KNEE - 1-2 VIEW COMPARISON:  05/13/2010 FINDINGS: Previous knee replacement. Surgical plate and fixating screws partially visualized with evidence of old fracture deformity of the distal femur. No acute fracture or malalignment. Extensive vascular calcifications. IMPRESSION: 1. No acute osseous abnormality. 2. Previous knee replacement. 3. Old fracture deformity of the distal femur with surgical hardware partially visualized. Electronically Signed   By: Jasmine Pang M.D.   On: 08/17/2022 19:36   CT Head Wo Contrast  Result Date: 08/17/2022 CLINICAL DATA:  Head trauma, minor (Age >= 65y); Neck trauma (Age >= 65y). Fall EXAM: CT HEAD WITHOUT CONTRAST CT CERVICAL SPINE WITHOUT CONTRAST TECHNIQUE: Multidetector CT imaging of the head and cervical spine was performed following the standard protocol without intravenous contrast. Multiplanar CT image reconstructions of the cervical spine were also generated. RADIATION DOSE REDUCTION: This exam was performed according to the departmental dose-optimization program which includes automated exposure control, adjustment of the mA and/or kV according to patient size and/or use of iterative reconstruction technique. COMPARISON:  CT head 07/21/2014 FINDINGS: CT HEAD FINDINGS Brain: Cerebral ventricle sizes are  concordant with the degree of cerebral volume loss. Patchy and confluent areas of decreased attenuation are noted throughout the deep and periventricular white matter of the cerebral hemispheres bilaterally, compatible with chronic microvascular ischemic disease. No evidence of large-territorial acute infarction. No parenchymal hemorrhage. No mass lesion. No extra-axial collection. No mass effect or midline shift. No hydrocephalus. Basilar cisterns are patent. Vascular: No hyperdense vessel. Atherosclerotic calcifications are present within the cavernous internal carotid arteries. Skull: No acute fracture or focal lesion. Sinuses/Orbits: Paranasal sinuses and mastoid air cells are clear. The orbits are unremarkable. Other: None. CT CERVICAL SPINE FINDINGS Alignment: Grade 1 anterolisthesis of C2 on C3. Skull base and vertebrae: Multilevel moderate degenerative changes of the spine. No associated severe osseous neural foraminal or central canal stenosis. No acute fracture. No aggressive appearing focal osseous lesion or focal pathologic process. Soft tissues and spinal canal: No prevertebral fluid or swelling. No visible canal hematoma. Upper chest: Emphysematous change. Other: Atherosclerotic plaque of the carotid arteries within the neck. Patient is edentulous. IMPRESSION: 1. No acute intracranial abnormality. 2. No acute displaced fracture or traumatic listhesis of the cervical spine. Electronically Signed   By: Tish Frederickson M.D.   On: 08/17/2022 03:11   CT Cervical Spine Wo Contrast  Result Date: 08/17/2022 CLINICAL DATA:  Head trauma, minor (Age >= 65y); Neck trauma (Age >= 65y). Fall EXAM: CT HEAD WITHOUT CONTRAST CT CERVICAL SPINE WITHOUT CONTRAST TECHNIQUE: Multidetector  CT imaging of the head and cervical spine was performed following the standard protocol without intravenous contrast. Multiplanar CT image reconstructions of the cervical spine were also generated. RADIATION DOSE REDUCTION: This exam  was performed according to the departmental dose-optimization program which includes automated exposure control, adjustment of the mA and/or kV according to patient size and/or use of iterative reconstruction technique. COMPARISON:  CT head 07/21/2014 FINDINGS: CT HEAD FINDINGS Brain: Cerebral ventricle sizes are concordant with the degree of cerebral volume loss. Patchy and confluent areas of decreased attenuation are noted throughout the deep and periventricular white matter of the cerebral hemispheres bilaterally, compatible with chronic microvascular ischemic disease. No evidence of large-territorial acute infarction. No parenchymal hemorrhage. No mass lesion. No extra-axial collection. No mass effect or midline shift. No hydrocephalus. Basilar cisterns are patent. Vascular: No hyperdense vessel. Atherosclerotic calcifications are present within the cavernous internal carotid arteries. Skull: No acute fracture or focal lesion. Sinuses/Orbits: Paranasal sinuses and mastoid air cells are clear. The orbits are unremarkable. Other: None. CT CERVICAL SPINE FINDINGS Alignment: Grade 1 anterolisthesis of C2 on C3. Skull base and vertebrae: Multilevel moderate degenerative changes of the spine. No associated severe osseous neural foraminal or central canal stenosis. No acute fracture. No aggressive appearing focal osseous lesion or focal pathologic process. Soft tissues and spinal canal: No prevertebral fluid or swelling. No visible canal hematoma. Upper chest: Emphysematous change. Other: Atherosclerotic plaque of the carotid arteries within the neck. Patient is edentulous. IMPRESSION: 1. No acute intracranial abnormality. 2. No acute displaced fracture or traumatic listhesis of the cervical spine. Electronically Signed   By: Tish Frederickson M.D.   On: 08/17/2022 03:11   CT CHEST ABDOMEN PELVIS W CONTRAST  Result Date: 08/17/2022 CLINICAL DATA:  Polytrauma, blunt fall, abdominal pain, AMS, possible sepsis EXAM: CT  CHEST, ABDOMEN, AND PELVIS WITH CONTRAST TECHNIQUE: Multidetector CT imaging of the chest, abdomen and pelvis was performed following the standard protocol during bolus administration of intravenous contrast. RADIATION DOSE REDUCTION: This exam was performed according to the departmental dose-optimization program which includes automated exposure control, adjustment of the mA and/or kV according to patient size and/or use of iterative reconstruction technique. CONTRAST:  OMNIPAQUE IOHEXOL 300 MG/ML  SOLN COMPARISON:  Chest x-ray 08/17/2022, x-ray pelvis 08/17/2022 FINDINGS: CHEST: Cardiovascular: No aortic injury. The thoracic aorta is normal in caliber. The heart is enlarged in size. No significant pericardial effusion. Severe atherosclerotic plaque. Four-vessel coronary calcification. Aortic valve leaflet calcification. Mediastinum/Nodes: No pneumomediastinum. No mediastinal hematoma. The esophagus is unremarkable.  Small hiatal hernia. The thyroid is unremarkable. The central airways are patent. No mediastinal, hilar, or axillary lymphadenopathy. Lungs/Pleura: Mild paraseptal emphysematous changes. Bilateral lower lobe atelectasis. Diffuse mild bronchial wall thickening. No focal consolidation. Subpleural left lower lobe 9 x 7 mm pulmonary nodule. Adjacent 4 mm pulmonary nodule. No pulmonary mass. No pulmonary contusion or laceration. No pneumatocele formation. No pleural effusion. No pneumothorax. No hemothorax. Musculoskeletal/Chest wall: No chest wall mass. No acute rib or sternal fracture. No spinal fracture. Chronic T3 and T4 superior endplate compression fractures with associated Schmorl nodes. Bilateral total shoulder arthroplasties. ABDOMEN / PELVIS: Hepatobiliary: Not enlarged. Nodular hepatic contour. Diffusely hypodense hepatic parenchyma compared to the spleen. No focal lesion. No laceration or subcapsular hematoma. The gallbladder is otherwise unremarkable with no radio-opaque gallstones. No  biliary ductal dilatation. Pancreas: Normal pancreatic contour. No main pancreatic duct dilatation. Spleen: Not enlarged. No focal lesion. No laceration, subcapsular hematoma, or vascular injury. Adrenals/Urinary Tract: No nodularity bilaterally. Bilateral kidneys  enhance symmetrically. Bilateral renal cortical scarring. Subcentimeter hypodensities are too small to characterize-no further follow-up indicated. No hydronephrosis. No contusion, laceration, or subcapsular hematoma. No injury to the vascular structures or collecting systems. No hydroureter. The urinary bladder is unremarkable. Stomach/Bowel: No small or large bowel wall thickening or dilatation. Colonic diverticulosis. 8 cm rectal stool ball. The appendix is unremarkable. Vasculature/Lymphatics: Atherosclerotic plaque. The portal, splenic, superior mesenteric veins are patent. No abdominal aorta or iliac aneurysm. No active contrast extravasation or pseudoaneurysm. No abdominal, pelvic, inguinal lymphadenopathy. Reproductive: Normal. Other: No simple free fluid ascites. No pneumoperitoneum. No hemoperitoneum. No mesenteric hematoma identified. No organized fluid collection. Musculoskeletal: No significant soft tissue hematoma. Small fat containing umbilical hernia. No acute pelvic fracture. No spinal fracture. Multilevel severe degenerative changes spine with multilevel severe intervertebral disc space narrowing and vacuum phenomenon. Disc bulge at the L3 and L4 and L4-L5 levels. Grade 1 anterolisthesis of L3 on L4 and L4 on L5 (total of 6 lumbar bodies). Total right hip arthroplasty partially visualized. Plate and screw fixation of the proximal femur partially visualized. Ports and Devices: None. IMPRESSION: 1. No acute intrathoracic, intra-abdominal, intrapelvic traumatic injury. 2. No acute fracture or traumatic malalignment of the thoracic or lumbar spine. Other imaging findings of potential clinical significance: 1. Aortic Atherosclerosis  (ICD10-I70.0) and Emphysema (ICD10-J43.9). 2. Multiple pulmonary nodules. Most significant: Left solid pulmonary nodule measuring 8 mm.Per Fleischner Society Guidelines, recommend a non-contrast Chest CT at 3-6 months, then another non-contrast Chest CT at 18-24 months. These guidelines do not apply to immunocompromised patients and patients with cancer. Follow up in patients with significant comorbidities as clinically warranted. For lung cancer screening, adhere to Lung-RADS guidelines. Reference: Radiology. 2017; 284(1):228-43. 3. Cardiomegaly. 4. Small hiatal hernia. 5. Cirrhosis with no definite finding of portal hypertension. No focal liver lesions identified. Please note that liver protocol enhanced MR and CT are the most sensitive tests for the screening detection of hepatocellular carcinoma in the high risk setting of cirrhosis. 6. Colonic diverticulosis with no acute diverticulitis. 7. An 8 cm rectal stool ball. Electronically Signed   By: Tish Frederickson M.D.   On: 08/17/2022 03:08   DG HIPS BILAT WITH PELVIS 3-4 VIEWS  Result Date: 08/17/2022 CLINICAL DATA:  [id number] Fall [id number] EXAM: DG HIP (WITH OR WITHOUT PELVIS) 3-4V BILAT COMPARISON:  CT abdomen pelvis 05/21/2014 FINDINGS: Total right hip arthroplasty. Plate and screw fixation of the proximal left femur partially visualized. No radiographic findings suggest surgical hardware complication. There is no evidence of hip fracture or dislocation of both hips. No acute displaced fracture or diastasis of the bones of the pelvis. There is no evidence of arthropathy or other focal bone abnormality. Vascular calcification. IMPRESSION: Negative for acute traumatic injury. Electronically Signed   By: Tish Frederickson M.D.   On: 08/17/2022 01:24   DG Chest Port 1 View  Result Date: 08/17/2022 CLINICAL DATA:  Questionable sepsis - evaluate for abnormality EXAM: PORTABLE CHEST 1 VIEW COMPARISON:  Chest x-ray 07/22/2014, CT chest 05/11/2014 FINDINGS: The heart  and mediastinal contours are within normal limits. Atherosclerotic plaque. Low lung volumes. No focal consolidation. No pulmonary edema. No pleural effusion. No pneumothorax. No acute osseous abnormality. Bilateral total shoulder arthroplasty. IMPRESSION: 1. Low lung volumes with no active disease. 2.  Aortic Atherosclerosis (ICD10-I70.0). Electronically Signed   By: Tish Frederickson M.D.   On: 08/17/2022 01:15     Medical Consultants:   None.   Subjective:    [name] no complaints tolerating his diet.  Objective:    Vitals:   08/17/22 2034 08/18/22 0353 08/18/22 0434 08/18/22 0500  BP: (!) 187/113 (!) 184/82 (!) 177/115   Pulse: 75 (!) 103 82   Resp: 18 18    Temp: 98.4 F (36.9 C) 98 F (36.7 C)    TempSrc: Oral Oral    SpO2: 98% 99%    Weight:    67.7 kg  Height:       SpO2: 99 %   Intake/Output Summary (Last 24 hours) at 08/18/2022 0743 Last data filed at 08/18/2022 0510 Gross per 24 hour  Intake 1280 ml  Output 550 ml  Net 730 ml   Filed Weights   08/17/22 0014 08/17/22 0803 08/18/22 0500  Weight: 81.7 kg 37.7 kg 67.7 kg    Exam: General exam: In no acute distress. Respiratory system: Good air movement and clear to auscultation. Cardiovascular system: S1 & S2 heard, RRR. No JVD. Gastrointestinal system: Abdomen is nondistended, soft and nontender.  Extremities: No pedal edema. Skin: No rashes, lesions or ulcers Psychiatry: Judgement and insight appear normal. Mood & affect appropriate. Data Reviewed:    Labs: Basic Metabolic Panel: Recent Labs  Lab 08/17/22 0024 08/17/22 0143 08/17/22 0356 08/17/22 0850  NA 138 139  --  139  K 3.8 4.1  --  3.8  CL 104 107  --  105  CO2 18*  --   --  20*  GLUCOSE 135* 133*  --  133*  BUN 32* 38*  --  33*  CREATININE 0.95 0.80  --  0.81  CALCIUM 9.5  --   --  9.2  MG  --   --  1.9  --    GFR Estimated Creatinine Clearance: 63.8 mL/min (by C-G formula based on SCr of 0.81 mg/dL). Liver Function  Tests: Recent Labs  Lab 08/17/22 0024  AST 47*  ALT 34  ALKPHOS 77  BILITOT 1.2  PROT 7.5  ALBUMIN 4.1   No results for input(s): "LIPASE", "AMYLASE" in the last 168 hours. Recent Labs  Lab 08/17/22 0850  AMMONIA 14   Coagulation profile Recent Labs  Lab 08/17/22 0024  INR 1.1   COVID-19 Labs  Recent Labs    08/17/22 0850  CRP 0.5    No results found for: "SARSCOV2NAA"  CBC: Recent Labs  Lab 08/17/22 0024 08/17/22 0143 08/17/22 0850  WBC 6.6  --  7.1  NEUTROABS 5.1  --   --   HGB 14.5 14.6 14.2  HCT 44.6 43.0 42.3  MCV 94.3  --  92.0  PLT 179  --  197   Cardiac Enzymes: Recent Labs  Lab 08/17/22 0850  CKTOTAL 147   BNP (last 3 results) No results for input(s): "PROBNP" in the last 8760 hours. CBG: Recent Labs  Lab 08/17/22 0743 08/17/22 1127 08/17/22 1651 08/17/22 2034 08/18/22 0724  GLUCAP 114* 203* 92 153* 143*   D-Dimer: No results for input(s): "DDIMER" in the last 72 hours. Hgb A1c: Recent Labs    08/17/22 0024  HGBA1C 6.4*   Lipid Profile: No results for input(s): "CHOL", "HDL", "LDLCALC", "TRIG", "CHOLHDL", "LDLDIRECT" in the last 72 hours. Thyroid function studies: Recent Labs    08/17/22 0120  TSH 2.561   Anemia work up: Recent Labs    08/17/22 0850  VITAMINB12 1,337*   Sepsis Labs: Recent Labs  Lab 08/17/22 0024 08/17/22 0129 08/17/22 0408 08/17/22 0850  WBC 6.6  --   --  7.1  LATICACIDVEN  --  2.4* 1.4  --  Microbiology Recent Results (from the past 240 hour(s))  Blood Culture (routine x 2)     Status: None (Preliminary result)   Collection Time: 08/17/22  1:16 AM   Specimen: BLOOD RIGHT HAND  Result Value Ref Range Status   Specimen Description   Final    BLOOD RIGHT HAND Performed at [facility], [address] [place], [place] [address]    Special Requests   Final    BOTTLES DRAWN AEROBIC ONLY Blood Culture adequate volume Performed at [facility], [address], [place], [place] [address]    Culture PENDING  Incomplete   Report Status PENDING  Incomplete  Blood Culture (routine x 2)     Status: None (Preliminary result)   Collection Time: 08/17/22  1:20 AM   Specimen: BLOOD LEFT FOREARM  Result Value Ref Range Status   Specimen Description   Final    BLOOD LEFT FOREARM Performed at [facility], [address] [place], [place] [address]    Special Requests   Final    BOTTLES DRAWN AEROBIC AND ANAEROBIC Blood Culture adequate volume Performed at [facility], [address] [place], [place] [address]    Culture PENDING  Incomplete   Report Status PENDING  Incomplete     Medications:    aspirin EC  81 mg Oral QODAY   atenolol  50 mg Oral Daily   clopidogrel  75 mg Oral Daily   fluticasone  2 spray Each Nare Daily   folic acid  1 mg Oral Daily   hydrALAZINE  25 mg Oral Q8H   insulin aspart  0-15 Units Subcutaneous TID WC   insulin aspart  0-5 Units Subcutaneous QHS   insulin aspart  4 Units Subcutaneous TID WC   irbesartan  75 mg Oral Daily   multivitamin  1 tablet Oral Daily   rosuvastatin  20 mg Oral Daily   thiamine (VITAMIN B1) injection  100 mg Intravenous Q24H   Continuous Infusions:  sodium chloride 75 mL/hr at 08/18/22 0132      LOS: 1 day   Marinda Elk  [facility]  08/18/2022, 7:43 AM

## 2022-08-18 NOTE — Progress Notes (Signed)
Bladder scan showed  and received an order of In and out cath. Removed (amber and clear urine).

## 2022-08-19 DIAGNOSIS — R404 Transient alteration of awareness: Secondary | ICD-10-CM | POA: Diagnosis not present

## 2022-08-19 DIAGNOSIS — K5790 Diverticulosis of intestine, part unspecified, without perforation or abscess without bleeding: Secondary | ICD-10-CM | POA: Diagnosis not present

## 2022-08-19 DIAGNOSIS — R062 Wheezing: Secondary | ICD-10-CM | POA: Diagnosis not present

## 2022-08-19 DIAGNOSIS — N390 Urinary tract infection, site not specified: Secondary | ICD-10-CM | POA: Diagnosis not present

## 2022-08-19 DIAGNOSIS — J439 Emphysema, unspecified: Secondary | ICD-10-CM | POA: Diagnosis not present

## 2022-08-19 DIAGNOSIS — E782 Mixed hyperlipidemia: Secondary | ICD-10-CM | POA: Diagnosis not present

## 2022-08-19 DIAGNOSIS — G928 Other toxic encephalopathy: Secondary | ICD-10-CM | POA: Diagnosis not present

## 2022-08-19 DIAGNOSIS — Z978 Presence of other specified devices: Secondary | ICD-10-CM | POA: Diagnosis not present

## 2022-08-19 DIAGNOSIS — R5383 Other fatigue: Secondary | ICD-10-CM | POA: Diagnosis not present

## 2022-08-19 DIAGNOSIS — E1159 Type 2 diabetes mellitus with other circulatory complications: Secondary | ICD-10-CM | POA: Diagnosis not present

## 2022-08-19 DIAGNOSIS — G4700 Insomnia, unspecified: Secondary | ICD-10-CM | POA: Diagnosis not present

## 2022-08-19 DIAGNOSIS — R059 Cough, unspecified: Secondary | ICD-10-CM | POA: Diagnosis not present

## 2022-08-19 DIAGNOSIS — D649 Anemia, unspecified: Secondary | ICD-10-CM | POA: Diagnosis not present

## 2022-08-19 DIAGNOSIS — E1140 Type 2 diabetes mellitus with diabetic neuropathy, unspecified: Secondary | ICD-10-CM | POA: Diagnosis not present

## 2022-08-19 DIAGNOSIS — L97811 Non-pressure chronic ulcer of other part of right lower leg limited to breakdown of skin: Secondary | ICD-10-CM | POA: Diagnosis not present

## 2022-08-19 DIAGNOSIS — Z7401 Bed confinement status: Secondary | ICD-10-CM | POA: Diagnosis not present

## 2022-08-19 DIAGNOSIS — Z23 Encounter for immunization: Secondary | ICD-10-CM | POA: Diagnosis not present

## 2022-08-19 DIAGNOSIS — I69811 Memory deficit following other cerebrovascular disease: Secondary | ICD-10-CM | POA: Diagnosis not present

## 2022-08-19 DIAGNOSIS — I517 Cardiomegaly: Secondary | ICD-10-CM | POA: Diagnosis not present

## 2022-08-19 DIAGNOSIS — U071 COVID-19: Secondary | ICD-10-CM | POA: Diagnosis not present

## 2022-08-19 DIAGNOSIS — I739 Peripheral vascular disease, unspecified: Secondary | ICD-10-CM | POA: Diagnosis not present

## 2022-08-19 DIAGNOSIS — M150 Primary generalized (osteo)arthritis: Secondary | ICD-10-CM | POA: Diagnosis not present

## 2022-08-19 DIAGNOSIS — I872 Venous insufficiency (chronic) (peripheral): Secondary | ICD-10-CM | POA: Diagnosis not present

## 2022-08-19 DIAGNOSIS — I10 Essential (primary) hypertension: Secondary | ICD-10-CM | POA: Diagnosis not present

## 2022-08-19 DIAGNOSIS — Z96652 Presence of left artificial knee joint: Secondary | ICD-10-CM | POA: Diagnosis not present

## 2022-08-19 DIAGNOSIS — E876 Hypokalemia: Secondary | ICD-10-CM | POA: Diagnosis not present

## 2022-08-19 DIAGNOSIS — S81801D Unspecified open wound, right lower leg, subsequent encounter: Secondary | ICD-10-CM | POA: Diagnosis not present

## 2022-08-19 DIAGNOSIS — I4819 Other persistent atrial fibrillation: Secondary | ICD-10-CM | POA: Diagnosis not present

## 2022-08-19 DIAGNOSIS — M79605 Pain in left leg: Secondary | ICD-10-CM | POA: Diagnosis not present

## 2022-08-19 DIAGNOSIS — R52 Pain, unspecified: Secondary | ICD-10-CM | POA: Diagnosis not present

## 2022-08-19 DIAGNOSIS — S81802D Unspecified open wound, left lower leg, subsequent encounter: Secondary | ICD-10-CM | POA: Diagnosis not present

## 2022-08-19 DIAGNOSIS — M6281 Muscle weakness (generalized): Secondary | ICD-10-CM | POA: Diagnosis not present

## 2022-08-19 DIAGNOSIS — E872 Acidosis, unspecified: Secondary | ICD-10-CM | POA: Diagnosis not present

## 2022-08-19 DIAGNOSIS — G9341 Metabolic encephalopathy: Secondary | ICD-10-CM | POA: Diagnosis not present

## 2022-08-19 DIAGNOSIS — N179 Acute kidney failure, unspecified: Secondary | ICD-10-CM | POA: Diagnosis not present

## 2022-08-19 DIAGNOSIS — K7460 Unspecified cirrhosis of liver: Secondary | ICD-10-CM | POA: Diagnosis not present

## 2022-08-19 DIAGNOSIS — I70223 Atherosclerosis of native arteries of extremities with rest pain, bilateral legs: Secondary | ICD-10-CM | POA: Diagnosis not present

## 2022-08-19 DIAGNOSIS — L97821 Non-pressure chronic ulcer of other part of left lower leg limited to breakdown of skin: Secondary | ICD-10-CM | POA: Diagnosis not present

## 2022-08-19 DIAGNOSIS — Z8616 Personal history of COVID-19: Secondary | ICD-10-CM | POA: Diagnosis not present

## 2022-08-19 DIAGNOSIS — I509 Heart failure, unspecified: Secondary | ICD-10-CM | POA: Diagnosis not present

## 2022-08-19 DIAGNOSIS — R410 Disorientation, unspecified: Secondary | ICD-10-CM | POA: Diagnosis not present

## 2022-08-19 DIAGNOSIS — K449 Diaphragmatic hernia without obstruction or gangrene: Secondary | ICD-10-CM | POA: Diagnosis not present

## 2022-08-19 DIAGNOSIS — L97509 Non-pressure chronic ulcer of other part of unspecified foot with unspecified severity: Secondary | ICD-10-CM | POA: Diagnosis not present

## 2022-08-19 DIAGNOSIS — R339 Retention of urine, unspecified: Secondary | ICD-10-CM | POA: Diagnosis not present

## 2022-08-19 DIAGNOSIS — I6930 Unspecified sequelae of cerebral infarction: Secondary | ICD-10-CM | POA: Diagnosis not present

## 2022-08-19 DIAGNOSIS — M109 Gout, unspecified: Secondary | ICD-10-CM | POA: Diagnosis not present

## 2022-08-19 DIAGNOSIS — N181 Chronic kidney disease, stage 1: Secondary | ICD-10-CM | POA: Diagnosis not present

## 2022-08-19 DIAGNOSIS — I700 Atherosclerosis of aorta: Secondary | ICD-10-CM | POA: Diagnosis not present

## 2022-08-19 LAB — GLUCOSE, CAPILLARY
Glucose-Capillary: 128 mg/dL — ABNORMAL HIGH (ref 70–99)
Glucose-Capillary: 104 mg/dL — ABNORMAL HIGH (ref 70–99)
Glucose-Capillary: 127 mg/dL — ABNORMAL HIGH (ref 70–99)

## 2022-08-19 MED ORDER — ROSUVASTATIN CALCIUM 10 MG PO TABS
10.0000 mg | ORAL_TABLET | Freq: Every day | ORAL | Status: DC
  Filled 2022-08-19: qty 1

## 2022-08-19 MED ORDER — TAMSULOSIN HCL 0.4 MG PO CAPS: 0.4000 mg | ORAL_CAPSULE | Freq: Every day | ORAL | Status: AC

## 2022-08-19 MED ORDER — THIAMINE MONONITRATE 100 MG PO TABS
100.0000 mg | ORAL_TABLET | Freq: Every day | ORAL | Status: DC
  Administered 2022-08-19: 100 mg via ORAL
  Filled 2022-08-19: qty 1

## 2022-08-19 MED ORDER — METFORMIN HCL 500 MG PO TABS: 1000.0000 mg | ORAL_TABLET | Freq: Two times a day (BID) | ORAL | Status: DC

## 2022-08-19 NOTE — Plan of Care (Signed)
Uneventful PM shift. Pt able to rest well with minimum disturbance. Pt remained calm and cooperative throughout the PM shift.  Problem: Education: Goal: Ability to describe self-care measures that may prevent or decrease complications (Diabetes Survival Skills Education) will improve Outcome: Progressing Goal: Individualized Educational Video(s) Outcome: Progressing   Problem: Health Behavior/Discharge Planning: Goal: Ability to identify and utilize available resources and services will improve Outcome: Progressing Goal: Ability to manage health-related needs will improve Outcome: Progressing   Problem: Skin Integrity: Goal: Risk for impaired skin integrity will decrease Outcome: Progressing

## 2022-08-19 NOTE — TOC Transition Note (Signed)
Transition of Care [facility]) - CM/SW Discharge Note   Patient Details  Name: [name] MRN: [id number] Date of Birth: [birth year]  [facility] CM/SW Contact:  Larrie Kass, LCSW Phone Number: 08/19/2022, 2:07 PM   Clinical Narrative:    Pt to d/c to [facility], pt's RM assignment 122, call report [phone]. PTAR called no further TOC needs TOC sign off.    Final next level of care: Skilled Nursing Facility Barriers to Discharge: No Barriers Identified   Patient Goals and CMS Choice      Discharge Placement                Patient chooses bed at: [facility] Patient to be transferred to facility by: ems Name of family member notified: shelli Patient and family notified of of transfer: 08/19/22  Discharge Plan and Services Additional resources added to the After Visit Summary for   In-house Referral: Clinical Social Work                                   Social Determinants of Health (SDOH) Interventions SDOH Screenings   Food Insecurity: No Food Insecurity (08/18/2022)  Housing: Low Risk  (08/18/2022)  Transportation Needs: No Transportation Needs (08/18/2022)  Utilities: Not At Risk (08/18/2022)  Depression (PHQ2-9): Low Risk  (08/06/2021)  Tobacco Use: Medium Risk (08/17/2022)     Readmission Risk Interventions    10/12/2021   10:53 AM  Readmission Risk Prevention Plan  Post Dischage Appt Complete  Medication Screening Complete  Transportation Screening Complete

## 2022-08-19 NOTE — Progress Notes (Signed)
HOSPITALIST ROUNDING NOTE VOLVY SALLER MVH:[id number]  DOB: 09/11/1935  DOA: 08/17/2022  PCP: Darrow Bussing, MD  08/19/2022,9:16 AM   LOS: 2 days      Code Status: Full code   From: Home  current Dispo: Skilled facility     87 year old white male prior bilateral lower extremity critical limb ischemia with right sided tissue loss status post iliofemoral endarterectomy, stenting 10/12/2021 Dr. Sherral Hammers vascular Bradycardia with diastolic HF in 2016 Prior permanent A-fib currently on Pradaxa for stroke follows with Dr. 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 SDH CVA 05/2005 Chronic EtOH drinks 3 glasses of vodka daily Chronic wounds left lower leg with fat layer exposed on multiple areas left lower extremity   Patient brought to the emergency room 08/17/2018 for altered mental status caveat 5 per ED too weak to get up and ambulate-blood pressure 60 systolic improved without any specific intervention Workup revealed lactic acid elevated which cleared with IV fluid CT ABD pelvis large stool ball patient was disimpacted-BUN/creatinine only mildly elevated patient not dehydrated Lasix held  Patient was worked up for acute metabolic encephalopathy-felt to be polypharmacy in the setting of EtOH Lyrica/tramadol  Plan  Acute toxic metabolic encephalopathy 2/2 mild volume depletion (dehydration) +/- Lyrica/tramadol +/- chronic EtOH-drinks 3 beers a day-B12 negative CK negative ESR CRP negative cultures also negative and patient did not mount a septic prodrome Tramadol has evidence for significant deleterious effects Need alternative to ACE/ARB Encephalopathy resolved-answers all orienting questions except the month-knows the president Because of his debility weakness he has qualified for skilled rehab he also has a left lower extremity wound which will require further skilled  attention  Unstageable pressure injury right heel and left heel in addition to left anterior shin wound  Feel patient will benefit from skilled care and will continue to mobilize here during hospitalization  Chronic EtOH without signs of withdrawal On thiamine and folate here  Volume depletion without dehydration ARB discontinued forever, Lasix 20 daily only careful weight management outpatient needs TOC visit.  PCP/cardiology Gap acidosis has resolved from admission and was noninfectious likely  Chronic atrial fibrillation CHADS2 score about 5 Not a candidate for prior physician for anticoagulation-previously on Pradaxa for SDH/CVA in the remote past and will continue that  Prior endarterectomy Dr. 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-well-controlled with A1c of 6.4-patient is 87-hesitate to aggressively manage See above Would resume in hospital metformin 1000 twice daily as this is a chronic med-  Pulmonary nodule Needs outpatient characterization based on PCP and patient discussion  DVT prophylaxis: SCD  Status is: Inpatient Remains inpatient appropriate because:   Requires placement at skilled facility    Subjective: Awake coherent can tell me date time year-kind of sleepy does not really awaken completely-blinds were raised patient was stimulated Patient missed only 1 question thought that it was October but otherwise was able to tell me date time place person as well as president Not sure if he has been out of bed  Objective + exam Vitals:   08/18/22 0750 08/18/22 1443 08/18/22 2138 08/19/22 0432  BP:  (!) 140/71 (!) 141/76 131/72  Pulse:  74 71 75  Resp: 19 18 19 19   Temp:  97.7  F (36.5 C) 98 F (36.7 C) 98.6 F (37 C)  TempSrc:  Oral Oral Oral  SpO2:   97% 98% 98%  Weight:    62 kg  Height:       Filed Weights   08/17/22 0803 08/18/22 0500 08/19/22 0432  Weight: 67.7 kg 67.7 kg 62 kg    Examination:  Euthymic white male no distress Chest clear no wheeze rales rhonchi S1-S2 seems to be in sinus on exam Abdomen soft Left anterior shin exam as above and notes No lower extremity edema Overall pleasant  Data Reviewed: reviewed   CBC    Component Value Date/Time   WBC 7.1 08/17/2022 0850   RBC 4.60 08/17/2022 0850   HGB 14.2 08/17/2022 0850   HGB 11.6 (L) 12/19/2021 1140   HCT 42.3 08/17/2022 0850   HCT 35.1 (L) 12/19/2021 1140   PLT 197 08/17/2022 0850   PLT 258 12/19/2021 1140   MCV 92.0 08/17/2022 0850   MCV 95 12/19/2021 1140   MCH 30.9 08/17/2022 0850   MCHC 33.6 08/17/2022 0850   RDW 14.5 08/17/2022 0850   RDW 13.5 12/19/2021 1140   LYMPHSABS 0.9 08/17/2022 0024   LYMPHSABS 1.4 12/19/2021 1140   MONOABS 0.6 08/17/2022 0024   EOSABS 0.0 08/17/2022 0024   EOSABS 0.2 12/19/2021 1140   BASOSABS 0.0 08/17/2022 0024   BASOSABS 0.1 12/19/2021 1140      Latest Ref Rng & Units 08/17/2022    8:50 AM 08/17/2022    1:43 AM 08/17/2022   12:24 AM  CMP  Glucose 70 - 99 mg/dL 865  784  696   BUN 8 - 23 mg/dL 33  38  32   Creatinine 0.61 - 1.24 mg/dL 2.95  2.84  1.32   Sodium 135 - 145 mmol/L 139  139  138   Potassium 3.5 - 5.1 mmol/L 3.8  4.1  3.8   Chloride 98 - 111 mmol/L 105  107  104   CO2 22 - 32 mmol/L 20   18   Calcium 8.9 - 10.3 mg/dL 9.2   9.5   Total Protein 6.5 - 8.1 g/dL   7.5   Total Bilirubin 0.3 - 1.2 mg/dL   1.2   Alkaline Phos 38 - 126 U/L   77   AST 15 - 41 U/L   47   ALT 0 - 44 U/L   34      Scheduled Meds:  aspirin EC  81 mg Oral QODAY   atenolol  50 mg Oral Daily   clopidogrel  75 mg Oral Daily   fluticasone  2 spray Each Nare Daily   folic acid  1 mg Oral Daily   furosemide  20 mg Oral Daily   hydrALAZINE  25 mg Oral Q8H   insulin aspart  0-15 Units Subcutaneous TID WC   insulin aspart   0-5 Units Subcutaneous QHS   insulin aspart  4 Units Subcutaneous TID WC   irbesartan  150 mg Oral Daily   multivitamin  1 tablet Oral Daily   rosuvastatin  20 mg Oral Daily   tamsulosin  0.4 mg Oral QPC breakfast   thiamine  100 mg Oral Daily   Continuous Infusions:  Time 43 minutes  Rhetta Mura, MD  [facility]

## 2022-08-19 NOTE — Progress Notes (Signed)
Physical Therapy Treatment Patient Details Name: [name] MRN: [id number] DOB: [birth year] Today's Date: 08/19/2022   History of Present Illness 87 yo male admitted with acute metabolic encephalopathy, weakness. Hx of knee replacements, hip replacement, foot wounds, DM, chronic alcoholism, gout, CVA, Afib, neuropathy, septic arthritis, SDH    PT Comments  Max encouragement from therapist and RN for pt participation. He sat EOB for ~10 minute with Min guard A. He was unwilling to attempt standing on today despite encouragement. Assisted pt back to bed at his request. Patient will benefit from continued inpatient follow up therapy, <3 hours/day     If plan is discharge home, recommend the following: Assistance with cooking/housework;Assist for transportation;Help with stairs or ramp for entrance;A lot of help with walking and/or transfers;A lot of help with bathing/dressing/bathroom   Can travel by private vehicle     No  Equipment Recommendations  None recommended by PT    Recommendations for Other Services       Precautions / Restrictions Precautions Precautions: Fall Restrictions Weight Bearing Restrictions: No     Mobility  Bed Mobility Overal bed mobility: Needs Assistance Bed Mobility: Supine to Sit, Sit to Supine     Supine to sit: Total assist, +2 for physical assistance, +2 for safety/equipment, HOB elevated Sit to supine: Max assist, +2 for physical assistance, +2 for safety/equipment, HOB elevated   General bed mobility comments: Assist for trunk and bil LEs. Utilized bedpad for scooting, positioning. Increased time. Cues for safety. No initiation of task from pt despite cues, encouragement    Transfers                   General transfer comment: NT-pt not willing to attempt    Ambulation/Gait                   Stairs             Wheelchair Mobility     Tilt Bed    Modified Rankin (Stroke Patients Only)       Balance                                             Cognition Arousal: Alert Behavior During Therapy: WFL for tasks assessed/performed Overall Cognitive Status: No family/caregiver present to determine baseline cognitive functioning Area of Impairment: Orientation, Problem solving                 Orientation Level: Disoriented to, Time, Situation           Problem Solving: Requires verbal cues, Difficulty sequencing, Decreased initiation, Slow processing          Exercises      General Comments        Pertinent Vitals/Pain Pain Assessment Pain Assessment: Faces Faces Pain Scale: Hurts little more Pain Location: back, buttocks Pain Descriptors / Indicators: Discomfort, Guarding Pain Intervention(s): Limited activity within patient's tolerance, Monitored during session, Repositioned    Home Living                          Prior Function            PT Goals (current goals can now be found in the care plan section) Progress towards PT goals: Progressing toward goals    Frequency    Min 1X/week  PT Plan      Co-evaluation              AM-PAC PT "6 Clicks" Mobility   Outcome Measure  Help needed turning from your back to your side while in a flat bed without using bedrails?: Total Help needed moving from lying on your back to sitting on the side of a flat bed without using bedrails?: Total Help needed moving to and from a bed to a chair (including a wheelchair)?: Total Help needed standing up from a chair using your arms (e.g., wheelchair or bedside chair)?: Total Help needed to walk in hospital room?: Total Help needed climbing 3-5 steps with a railing? : Total 6 Click Score: 6    End of Session   Activity Tolerance: Patient tolerated treatment well Patient left: in bed;with call bell/phone within reach;with bed alarm set   PT Visit Diagnosis: Pain;Difficulty in walking, not elsewhere classified (R26.2);Muscle  weakness (generalized) (M62.81)     Time: 1610-9604 PT Time Calculation (min) (ACUTE ONLY): 30 min  Charges:    $Therapeutic Activity: 23-37 mins PT General Charges $$ ACUTE PT VISIT: 1 Visit                         Faye Ramsay, PT Acute Rehabilitation  Office: [phone]

## 2022-08-19 NOTE — Progress Notes (Signed)
No answer at [facility] to call report. Pt belongings with him.

## 2022-08-19 NOTE — Discharge Summary (Addendum)
Physician Discharge Summary  [name] DJM:[id number] DOB: September 23, 1935 DOA: 08/17/2022  PCP: Darrow Bussing, MD  Admit date: 08/17/2022 Discharge date: 08/19/2022  Time spent: 25 minutes  Recommendations for Outpatient Follow-up:  Needs outpatient assessment of wounds by wound care physician Dr. Kathryne Eriksson follow her orders with regards to wound care instructions for lower extremity wounds Recommend Chem-12 CBC in about 1 week Consider outpatient evaluation for pulmonary nodules Needs voiding trial at facility once sacral wounds heal---d/c with foley  Discharge Diagnoses:  MAIN problem for hospitalization   Lyrica withdrawal causing toxic metabolic encephalopathy Volume depletion with mild dehydration on admission Permanent A-fib on Pradaxa and not a candidate for anticoagulation Prior CVA SDH 2016 and 2007 Chronic EtOH without withdrawal Chronic wounds in lower extremities and pressure ulcers as below  Please see below for itemized issues addressed in HOpsital- refer to other progress notes for clarity if needed  Discharge Condition: Improved  Diet recommendation: Heart healthy  Filed Weights   08/17/22 0803 08/18/22 0500 08/19/22 0432  Weight: 67.7 kg 67.7 kg 62 kg    History of present illness:  87 year old white male prior bilateral lower extremity critical limb ischemia with right sided tissue loss status post iliofemoral endarterectomy, stenting 10/12/2021 Dr. 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 SDH CVA 05/2005 Chronic EtOH drinks 3 glasses of vodka daily Chronic wounds left lower leg with fat layer exposed on multiple areas left lower extremity     Patient brought to the emergency room 08/17/2018 for altered mental status caveat 5 per ED  too weak to get up and ambulate-blood pressure 60 systolic improved without any specific intervention Workup revealed lactic acid elevated which cleared with IV fluid CT ABD pelvis large stool ball patient was disimpacted-BUN/creatinine only mildly elevated patient not dehydrated Lasix held   Patient was worked up for acute metabolic encephalopathy likely secondary to chronic EtOH, possible underlying Lyrica and tramadol use although patient had not taken them in several days  Hospital Course:  Toxic metabolic encephalopathy secondary to Lyrica abrupt withdrawal-had not taken Lyrica or tramadol for several days prior to coming in but was still drinking All the workup was negative including ESR CRP B12 CK and he did not have septic physiology His encephalopathy cleared and he was answering all questions and he will benefit from short-term skilled stay  Unstageable right pressure injury to heel and left shin wound He can have these covered with Mepilex and follow-up with Dr. Mikey Bussing in the outpatient setting  Chronic EtOH drinks 3 beers a day Consider thiamine and folate as an outpatient  Volume depletion without dehydration and gap acidosis We discontinued ARB completely forever and only Lasix 20 daily-needs outpatient PCP cardiology follow-up  Chronic atrial fibs CHA2DS2-VASc 2 above 5//underlying diastolic heart failure Not really candidate for anticoagulation continue Pradaxa 75 as well as aspirin 81 which she takes every other day, can continue Crestor 10 daily and can also continue hydralazine 25 every 8 Continue atenolol 50 daily for blood pressure rate control Can continue low-dose Lasix 20 daily  Prior endarterectomy 2023 Dr. Sherral Hammers Can continue Pradaxa from my perspective, daughter does mention that he had been taken off of this because of conjunctival injection and unless there is acute visual loss I do not think this needs to be held  DM TY 2  complication neuropathy but  well-controlled A1c 6.4 Continue in the outpatient setting metformin 1000 twice daily  Pulmonary nodule Discussed this in the outpatient setting with PCP and should probably have low-dose CT scan   Discharge Exam: Vitals:   08/19/22 0432 08/19/22 1208  BP: 131/72 136/75  Pulse: 75 76  Resp: 19   Temp: 98.6 F (37 C)   SpO2: 98%     Subj on day of d/c   Awake coherent-some confusion about whether he wanted to go to rehab or not but we have spoken to him in him and his daughter seem aligned to going to rehab today He looks comfortable  General Exam on discharge  EOMI NCAT no focal deficit frail looks about stated age Chest clear no added sound rales rhonchi sinus arrhythmia Abdomen soft I examined the shin as per picture below  Discharge Instructions   Discharge Instructions     Diet - low sodium heart healthy   Complete by: As directed    Discharge wound care:   Complete by: As directed    As pr Dr. Mikey Bussing (wound care MD) last note   Increase activity slowly   Complete by: As directed       Allergies as of 08/19/2022       Reactions   Allopurinol Other (See Comments)   Makes head 'feel funny'   Atorvastatin Other (See Comments)   Unknown reaction    Motrin [ibuprofen] Other (See Comments)   Blisters in groin area   Latex Other (See Comments)   Redness         Medication List     STOP taking these medications    irbesartan 75 MG tablet Commonly known as: AVAPRO   Lyrica 75 MG capsule Generic drug: pregabalin   tobramycin 1.2 g injection Commonly known as: NEBCIN   traMADol 50 MG tablet Commonly known as: ULTRAM       TAKE these medications    aspirin EC 81 MG tablet Take 81 mg by mouth every other day. Swallow whole.   atenolol 50 MG tablet Commonly known as: TENORMIN Take 50 mg by mouth daily.   BEET ROOT PO Take 1 tablet by mouth daily.   clopidogrel 75 MG tablet Commonly known as: PLAVIX TAKE 1 TABLET BY MOUTH ONCE DAILY  AT 6 AM What changed: See the new instructions.   fluticasone 50 MCG/ACT nasal spray Commonly known as: FLONASE Place 2 sprays into both nostrils as needed for allergies.   furosemide 20 MG tablet Commonly known as: LASIX Take 20 mg by mouth daily.   hydrALAZINE 25 MG tablet Commonly known as: APRESOLINE Take 1 tablet (25 mg total) by mouth every 8 (eight) hours.   metFORMIN 1000 MG tablet Commonly known as: GLUCOPHAGE Take 1,000 mg by mouth in the morning and at bedtime.   multivitamin with minerals Tabs tablet Take 1 tablet by mouth daily.   protein supplement shake Liqd Commonly known as: PREMIER PROTEIN Take 11 oz by mouth daily.   rosuvastatin 10 MG tablet Commonly known as: CRESTOR Take 10 mg by mouth daily. What changed: Another medication with the same name was removed. Continue taking this medication, and follow the directions you see here.   tamsulosin 0.4 MG Caps capsule Commonly known as: FLOMAX Take 1 capsule (0.4 mg total) by mouth daily after breakfast. Start taking on: August 20, 2022   Uloric 80 MG Tabs Generic drug: Febuxostat Take 80 mg by mouth daily.   VISINE OP  Place 1 drop into both eyes daily as needed (burning).               Discharge Care Instructions  (From admission, onward)           Start     Ordered   08/19/22 0000  Discharge wound care:       Comments: As pr Dr. Mikey Bussing (wound care MD) last note   08/19/22 1354           Allergies  Allergen Reactions   Allopurinol Other (See Comments)    Makes head 'feel funny'   Atorvastatin Other (See Comments)    Unknown reaction    Motrin [Ibuprofen] Other (See Comments)    Blisters in groin area   Latex Other (See Comments)    Redness       The results of significant diagnostics from this hospitalization (including imaging, microbiology, ancillary and laboratory) are listed below for reference.    Significant Diagnostic Studies: DG Knee 1-2 Views Left  Result  Date: 08/17/2022 CLINICAL DATA:  Medial knee pain EXAM: LEFT KNEE - 1-2 VIEW COMPARISON:  05/13/2010 FINDINGS: Previous knee replacement. Surgical plate and fixating screws partially visualized with evidence of old fracture deformity of the distal femur. No acute fracture or malalignment. Extensive vascular calcifications. IMPRESSION: 1. No acute osseous abnormality. 2. Previous knee replacement. 3. Old fracture deformity of the distal femur with surgical hardware partially visualized. Electronically Signed   By: Jasmine Pang M.D.   On: 08/17/2022 19:36   CT Head Wo Contrast  Result Date: 08/17/2022 CLINICAL DATA:  Head trauma, minor (Age >= 65y); Neck trauma (Age >= 65y). Fall EXAM: CT HEAD WITHOUT CONTRAST CT CERVICAL SPINE WITHOUT CONTRAST TECHNIQUE: Multidetector CT imaging of the head and cervical spine was performed following the standard protocol without intravenous contrast. Multiplanar CT image reconstructions of the cervical spine were also generated. RADIATION DOSE REDUCTION: This exam was performed according to the departmental dose-optimization program which includes automated exposure control, adjustment of the mA and/or kV according to patient size and/or use of iterative reconstruction technique. COMPARISON:  CT head 07/21/2014 FINDINGS: CT HEAD FINDINGS Brain: Cerebral ventricle sizes are concordant with the degree of cerebral volume loss. Patchy and confluent areas of decreased attenuation are noted throughout the deep and periventricular white matter of the cerebral hemispheres bilaterally, compatible with chronic microvascular ischemic disease. No evidence of large-territorial acute infarction. No parenchymal hemorrhage. No mass lesion. No extra-axial collection. No mass effect or midline shift. No hydrocephalus. Basilar cisterns are patent. Vascular: No hyperdense vessel. Atherosclerotic calcifications are present within the cavernous internal carotid arteries. Skull: No acute fracture or  focal lesion. Sinuses/Orbits: Paranasal sinuses and mastoid air cells are clear. The orbits are unremarkable. Other: None. CT CERVICAL SPINE FINDINGS Alignment: Grade 1 anterolisthesis of C2 on C3. Skull base and vertebrae: Multilevel moderate degenerative changes of the spine. No associated severe osseous neural foraminal or central canal stenosis. No acute fracture. No aggressive appearing focal osseous lesion or focal pathologic process. Soft tissues and spinal canal: No prevertebral fluid or swelling. No visible canal hematoma. Upper chest: Emphysematous change. Other: Atherosclerotic plaque of the carotid arteries within the neck. Patient is edentulous. IMPRESSION: 1. No acute intracranial abnormality. 2. No acute displaced fracture or traumatic listhesis of the cervical spine. Electronically Signed   By: Tish Frederickson M.D.   On: 08/17/2022 03:11   CT Cervical Spine Wo Contrast  Result Date: 08/17/2022 CLINICAL DATA:  Head trauma, minor (Age >=  65y); Neck trauma (Age >= 65y). Fall EXAM: CT HEAD WITHOUT CONTRAST CT CERVICAL SPINE WITHOUT CONTRAST TECHNIQUE: Multidetector CT imaging of the head and cervical spine was performed following the standard protocol without intravenous contrast. Multiplanar CT image reconstructions of the cervical spine were also generated. RADIATION DOSE REDUCTION: This exam was performed according to the departmental dose-optimization program which includes automated exposure control, adjustment of the mA and/or kV according to patient size and/or use of iterative reconstruction technique. COMPARISON:  CT head 07/21/2014 FINDINGS: CT HEAD FINDINGS Brain: Cerebral ventricle sizes are concordant with the degree of cerebral volume loss. Patchy and confluent areas of decreased attenuation are noted throughout the deep and periventricular white matter of the cerebral hemispheres bilaterally, compatible with chronic microvascular ischemic disease. No evidence of large-territorial acute  infarction. No parenchymal hemorrhage. No mass lesion. No extra-axial collection. No mass effect or midline shift. No hydrocephalus. Basilar cisterns are patent. Vascular: No hyperdense vessel. Atherosclerotic calcifications are present within the cavernous internal carotid arteries. Skull: No acute fracture or focal lesion. Sinuses/Orbits: Paranasal sinuses and mastoid air cells are clear. The orbits are unremarkable. Other: None. CT CERVICAL SPINE FINDINGS Alignment: Grade 1 anterolisthesis of C2 on C3. Skull base and vertebrae: Multilevel moderate degenerative changes of the spine. No associated severe osseous neural foraminal or central canal stenosis. No acute fracture. No aggressive appearing focal osseous lesion or focal pathologic process. Soft tissues and spinal canal: No prevertebral fluid or swelling. No visible canal hematoma. Upper chest: Emphysematous change. Other: Atherosclerotic plaque of the carotid arteries within the neck. Patient is edentulous. IMPRESSION: 1. No acute intracranial abnormality. 2. No acute displaced fracture or traumatic listhesis of the cervical spine. Electronically Signed   By: Tish Frederickson M.D.   On: 08/17/2022 03:11   CT CHEST ABDOMEN PELVIS W CONTRAST  Result Date: 08/17/2022 CLINICAL DATA:  Polytrauma, blunt fall, abdominal pain, AMS, possible sepsis EXAM: CT CHEST, ABDOMEN, AND PELVIS WITH CONTRAST TECHNIQUE: Multidetector CT imaging of the chest, abdomen and pelvis was performed following the standard protocol during bolus administration of intravenous contrast. RADIATION DOSE REDUCTION: This exam was performed according to the departmental dose-optimization program which includes automated exposure control, adjustment of the mA and/or kV according to patient size and/or use of iterative reconstruction technique. CONTRAST:  OMNIPAQUE IOHEXOL 300 MG/ML  SOLN COMPARISON:  Chest x-ray 08/17/2022, x-ray pelvis 08/17/2022 FINDINGS: CHEST: Cardiovascular: No  aortic injury. The thoracic aorta is normal in caliber. The heart is enlarged in size. No significant pericardial effusion. Severe atherosclerotic plaque. Four-vessel coronary calcification. Aortic valve leaflet calcification. Mediastinum/Nodes: No pneumomediastinum. No mediastinal hematoma. The esophagus is unremarkable.  Small hiatal hernia. The thyroid is unremarkable. The central airways are patent. No mediastinal, hilar, or axillary lymphadenopathy. Lungs/Pleura: Mild paraseptal emphysematous changes. Bilateral lower lobe atelectasis. Diffuse mild bronchial wall thickening. No focal consolidation. Subpleural left lower lobe 9 x 7 mm pulmonary nodule. Adjacent 4 mm pulmonary nodule. No pulmonary mass. No pulmonary contusion or laceration. No pneumatocele formation. No pleural effusion. No pneumothorax. No hemothorax. Musculoskeletal/Chest wall: No chest wall mass. No acute rib or sternal fracture. No spinal fracture. Chronic T3 and T4 superior endplate compression fractures with associated Schmorl nodes. Bilateral total shoulder arthroplasties. ABDOMEN / PELVIS: Hepatobiliary: Not enlarged. Nodular hepatic contour. Diffusely hypodense hepatic parenchyma compared to the spleen. No focal lesion. No laceration or subcapsular hematoma. The gallbladder is otherwise unremarkable with no radio-opaque gallstones. No biliary ductal dilatation. Pancreas: Normal pancreatic contour. No main pancreatic duct dilatation. Spleen: Not  enlarged. No focal lesion. No laceration, subcapsular hematoma, or vascular injury. Adrenals/Urinary Tract: No nodularity bilaterally. Bilateral kidneys enhance symmetrically. Bilateral renal cortical scarring. Subcentimeter hypodensities are too small to characterize-no further follow-up indicated. No hydronephrosis. No contusion, laceration, or subcapsular hematoma. No injury to the vascular structures or collecting systems. No hydroureter. The urinary bladder is unremarkable. Stomach/Bowel: No  small or large bowel wall thickening or dilatation. Colonic diverticulosis. 8 cm rectal stool ball. The appendix is unremarkable. Vasculature/Lymphatics: Atherosclerotic plaque. The portal, splenic, superior mesenteric veins are patent. No abdominal aorta or iliac aneurysm. No active contrast extravasation or pseudoaneurysm. No abdominal, pelvic, inguinal lymphadenopathy. Reproductive: Normal. Other: No simple free fluid ascites. No pneumoperitoneum. No hemoperitoneum. No mesenteric hematoma identified. No organized fluid collection. Musculoskeletal: No significant soft tissue hematoma. Small fat containing umbilical hernia. No acute pelvic fracture. No spinal fracture. Multilevel severe degenerative changes spine with multilevel severe intervertebral disc space narrowing and vacuum phenomenon. Disc bulge at the L3 and L4 and L4-L5 levels. Grade 1 anterolisthesis of L3 on L4 and L4 on L5 (total of 6 lumbar bodies). Total right hip arthroplasty partially visualized. Plate and screw fixation of the proximal femur partially visualized. Ports and Devices: None. IMPRESSION: 1. No acute intrathoracic, intra-abdominal, intrapelvic traumatic injury. 2. No acute fracture or traumatic malalignment of the thoracic or lumbar spine. Other imaging findings of potential clinical significance: 1. Aortic Atherosclerosis (ICD10-I70.0) and Emphysema (ICD10-J43.9). 2. Multiple pulmonary nodules. Most significant: Left solid pulmonary nodule measuring 8 mm.Per Fleischner Society Guidelines, recommend a non-contrast Chest CT at 3-6 months, then another non-contrast Chest CT at 18-24 months. These guidelines do not apply to immunocompromised patients and patients with cancer. Follow up in patients with significant comorbidities as clinically warranted. For lung cancer screening, adhere to Lung-RADS guidelines. Reference: Radiology. 2017; 284(1):228-43. 3. Cardiomegaly. 4. Small hiatal hernia. 5. Cirrhosis with no definite finding of  portal hypertension. No focal liver lesions identified. Please note that liver protocol enhanced MR and CT are the most sensitive tests for the screening detection of hepatocellular carcinoma in the high risk setting of cirrhosis. 6. Colonic diverticulosis with no acute diverticulitis. 7. An 8 cm rectal stool ball. Electronically Signed   By: Tish Frederickson M.D.   On: 08/17/2022 03:08   DG HIPS BILAT WITH PELVIS 3-4 VIEWS  Result Date: 08/17/2022 CLINICAL DATA:  [id number] Fall [id number] EXAM: DG HIP (WITH OR WITHOUT PELVIS) 3-4V BILAT COMPARISON:  CT abdomen pelvis 05/21/2014 FINDINGS: Total right hip arthroplasty. Plate and screw fixation of the proximal left femur partially visualized. No radiographic findings suggest surgical hardware complication. There is no evidence of hip fracture or dislocation of both hips. No acute displaced fracture or diastasis of the bones of the pelvis. There is no evidence of arthropathy or other focal bone abnormality. Vascular calcification. IMPRESSION: Negative for acute traumatic injury. Electronically Signed   By: Tish Frederickson M.D.   On: 08/17/2022 01:24   DG Chest Port 1 View  Result Date: 08/17/2022 CLINICAL DATA:  Questionable sepsis - evaluate for abnormality EXAM: PORTABLE CHEST 1 VIEW COMPARISON:  Chest x-ray 07/22/2014, CT chest 05/11/2014 FINDINGS: The heart and mediastinal contours are within normal limits. Atherosclerotic plaque. Low lung volumes. No focal consolidation. No pulmonary edema. No pleural effusion. No pneumothorax. No acute osseous abnormality. Bilateral total shoulder arthroplasty. IMPRESSION: 1. Low lung volumes with no active disease. 2.  Aortic Atherosclerosis (ICD10-I70.0). Electronically Signed   By: Tish Frederickson M.D.   On: 08/17/2022 01:15    Microbiology: Recent  Results (from the past 240 hour(s))  Blood Culture (routine x 2)     Status: None (Preliminary result)   Collection Time: 08/17/22  1:16 AM   Specimen: BLOOD RIGHT HAND   Result Value Ref Range Status   Specimen Description   Final    BLOOD RIGHT HAND Performed at [facility], [address] [place], [place] [address]    Special Requests   Final    BOTTLES DRAWN AEROBIC ONLY Blood Culture adequate volume Performed at [facility], [address] [place], [place] [address]    Culture   Final    NO GROWTH 2 DAYS Performed at [facility], [address], [place], [place] [address]    Report Status PENDING  Incomplete  Blood Culture (routine x 2)     Status: None (Preliminary result)   Collection Time: 08/17/22  1:20 AM   Specimen: BLOOD LEFT FOREARM  Result Value Ref Range Status   Specimen Description   Final    BLOOD LEFT FOREARM Performed at [facility], [address], [place], [place] [address]    Special Requests   Final    BOTTLES DRAWN AEROBIC AND ANAEROBIC Blood Culture adequate volume Performed at [facility], [address], [place], [place] [address]    Culture   Final    NO GROWTH 2 DAYS Performed at [facility], [address] [place], [place] [address]    Report Status PENDING  Incomplete  MRSA Next Gen by PCR, Nasal     Status: None   Collection Time: 08/18/22  1:59 PM   Specimen: Nasal Mucosa; Nasal Swab  Result Value Ref Range Status   MRSA by PCR Next Gen NOT DETECTED NOT DETECTED Final    Comment: (NOTE) The GeneXpert MRSA Assay (FDA approved for NASAL specimens only), is one component of a comprehensive MRSA colonization surveillance program. It is not intended to diagnose MRSA infection nor to guide or monitor treatment for MRSA infections. Test performance is not FDA approved in patients less than 64 years old. Performed at [facility], [address], [place], [place] [address]      Labs: Basic Metabolic Panel: Recent Labs  Lab 08/17/22 0024 08/17/22 0143 08/17/22 0356 08/17/22 0850  NA 138 139  --  139  K 3.8 4.1  --   3.8  CL 104 107  --  105  CO2 18*  --   --  20*  GLUCOSE 135* 133*  --  133*  BUN 32* 38*  --  33*  CREATININE 0.95 0.80  --  0.81  CALCIUM 9.5  --   --  9.2  MG  --   --  1.9  --    Liver Function Tests: Recent Labs  Lab 08/17/22 0024  AST 47*  ALT 34  ALKPHOS 77  BILITOT 1.2  PROT 7.5  ALBUMIN 4.1   No results for input(s): "LIPASE", "AMYLASE" in the last 168 hours. Recent Labs  Lab 08/17/22 0850  AMMONIA 14   CBC: Recent Labs  Lab 08/17/22 0024 08/17/22 0143 08/17/22 0850  WBC 6.6  --  7.1  NEUTROABS 5.1  --   --   HGB 14.5 14.6 14.2  HCT 44.6 43.0 42.3  MCV 94.3  --  92.0  PLT 179  --  197   Cardiac Enzymes: Recent Labs  Lab 08/17/22 0850  CKTOTAL 147   BNP: BNP (last  3 results) No results for input(s): "BNP" in the last 8760 hours.  ProBNP (last 3 results) No results for input(s): "PROBNP" in the last 8760 hours.  CBG: Recent Labs  Lab 08/18/22 1134 08/18/22 1650 08/18/22 2104 08/19/22 0759 08/19/22 1206  GLUCAP 110* 135* 125* 128* 104*       Signed:  Rhetta Mura MD   [facility] 08/19/2022, 1:54 PM

## 2022-08-19 NOTE — TOC Progression Note (Addendum)
Transition of Care [facility]) - Progression Note    Patient Details  Name: [name] MRN: [id number] Date of Birth: 1935-08-19  Transition of Care [facility]) CM/SW Contact  Kenneth Kass, LCSW Phone Number: 08/19/2022, 9:47 AM  Clinical Narrative:    CSW met with pt to present bed offers and received permission to speak with his daughter about bed choice.  CSW spoke with pt's daughter [name] , she has chosen [facility]. CSW explained pt will need insurance auth. CSW also discussed substance use resources , pt's daughter stated pt did not need it as he only drinks beer. CSW will start insurance auth, TOC to follow.   Adden  10:33am  Insurance Berkley Harvey was approved for [facility] [id number] 08/19/22-08/21/22. CSW spoke to [facility] admission with [name], she reports that they will not have a bed ready until Friday.   CSW spoke with pt's daughter to inform her that [name] is not have available until Friday and pt is medically stable for discharge. CSW presented the facilities that would be available today.  1:30pm Pt's daughter has chosen [facility] care. [facility] spoke with [facility] to change the facility on pt's insurance authorization. New insurance auth for [facility] Berkley Harvey [id number] 08/19/22-08/21/22, CSW spoke with [name] who reported pt can d/c today, MD made aware. TOC to follow.   Expected Discharge Plan: Skilled Nursing Facility Barriers to Discharge: Continued Medical Work up  Expected Discharge Plan and Services In-house Referral: Clinical Social Work     Living arrangements for the past 2 months: Single Family Home                                       Social Determinants of Health (SDOH) Interventions SDOH Screenings   Food Insecurity: No Food Insecurity (08/18/2022)  Housing: Low Risk  (08/18/2022)  Transportation Needs: No Transportation Needs (08/18/2022)  Utilities: Not At Risk (08/18/2022)  Depression (PHQ2-9): Low Risk  (08/06/2021)   Tobacco Use: Medium Risk (08/17/2022)    Readmission Risk Interventions    10/12/2021   10:53 AM  Readmission Risk Prevention Plan  Post Dischage Appt Complete  Medication Screening Complete  Transportation Screening Complete

## 2022-08-20 ENCOUNTER — Ambulatory Visit (HOSPITAL_BASED_OUTPATIENT_CLINIC_OR_DEPARTMENT_OTHER): Payer: Medicare Other | Admitting: Internal Medicine

## 2022-08-20 DIAGNOSIS — E1140 Type 2 diabetes mellitus with diabetic neuropathy, unspecified: Secondary | ICD-10-CM | POA: Diagnosis not present

## 2022-08-20 DIAGNOSIS — I4819 Other persistent atrial fibrillation: Secondary | ICD-10-CM | POA: Diagnosis not present

## 2022-08-20 DIAGNOSIS — M79605 Pain in left leg: Secondary | ICD-10-CM | POA: Diagnosis not present

## 2022-08-20 DIAGNOSIS — S81802D Unspecified open wound, left lower leg, subsequent encounter: Secondary | ICD-10-CM | POA: Diagnosis not present

## 2022-08-20 DIAGNOSIS — M6281 Muscle weakness (generalized): Secondary | ICD-10-CM | POA: Diagnosis not present

## 2022-08-22 DIAGNOSIS — R339 Retention of urine, unspecified: Secondary | ICD-10-CM | POA: Diagnosis not present

## 2022-08-22 DIAGNOSIS — Z978 Presence of other specified devices: Secondary | ICD-10-CM | POA: Diagnosis not present

## 2022-08-24 DIAGNOSIS — R339 Retention of urine, unspecified: Secondary | ICD-10-CM | POA: Diagnosis not present

## 2022-08-24 DIAGNOSIS — S81802D Unspecified open wound, left lower leg, subsequent encounter: Secondary | ICD-10-CM | POA: Diagnosis not present

## 2022-08-24 DIAGNOSIS — E1140 Type 2 diabetes mellitus with diabetic neuropathy, unspecified: Secondary | ICD-10-CM | POA: Diagnosis not present

## 2022-08-24 DIAGNOSIS — L97821 Non-pressure chronic ulcer of other part of left lower leg limited to breakdown of skin: Secondary | ICD-10-CM | POA: Diagnosis not present

## 2022-08-24 DIAGNOSIS — M79605 Pain in left leg: Secondary | ICD-10-CM | POA: Diagnosis not present

## 2022-08-24 DIAGNOSIS — Z978 Presence of other specified devices: Secondary | ICD-10-CM | POA: Diagnosis not present

## 2022-08-24 DIAGNOSIS — E782 Mixed hyperlipidemia: Secondary | ICD-10-CM | POA: Diagnosis not present

## 2022-08-25 DIAGNOSIS — M6281 Muscle weakness (generalized): Secondary | ICD-10-CM | POA: Diagnosis not present

## 2022-08-25 DIAGNOSIS — I4819 Other persistent atrial fibrillation: Secondary | ICD-10-CM | POA: Diagnosis not present

## 2022-08-25 DIAGNOSIS — N179 Acute kidney failure, unspecified: Secondary | ICD-10-CM | POA: Diagnosis not present

## 2022-08-25 DIAGNOSIS — R339 Retention of urine, unspecified: Secondary | ICD-10-CM | POA: Diagnosis not present

## 2022-08-25 DIAGNOSIS — S81802D Unspecified open wound, left lower leg, subsequent encounter: Secondary | ICD-10-CM | POA: Diagnosis not present

## 2022-08-25 DIAGNOSIS — Z978 Presence of other specified devices: Secondary | ICD-10-CM | POA: Diagnosis not present

## 2022-08-26 DIAGNOSIS — N181 Chronic kidney disease, stage 1: Secondary | ICD-10-CM | POA: Diagnosis not present

## 2022-08-26 DIAGNOSIS — D649 Anemia, unspecified: Secondary | ICD-10-CM | POA: Diagnosis not present

## 2022-08-26 DIAGNOSIS — E876 Hypokalemia: Secondary | ICD-10-CM | POA: Diagnosis not present

## 2022-08-26 DIAGNOSIS — G4700 Insomnia, unspecified: Secondary | ICD-10-CM | POA: Diagnosis not present

## 2022-08-26 DIAGNOSIS — M6281 Muscle weakness (generalized): Secondary | ICD-10-CM | POA: Diagnosis not present

## 2022-08-27 DIAGNOSIS — R5383 Other fatigue: Secondary | ICD-10-CM | POA: Diagnosis not present

## 2022-08-27 DIAGNOSIS — R059 Cough, unspecified: Secondary | ICD-10-CM | POA: Diagnosis not present

## 2022-08-27 DIAGNOSIS — M6281 Muscle weakness (generalized): Secondary | ICD-10-CM | POA: Diagnosis not present

## 2022-08-29 DIAGNOSIS — E876 Hypokalemia: Secondary | ICD-10-CM | POA: Diagnosis not present

## 2022-08-29 DIAGNOSIS — U071 COVID-19: Secondary | ICD-10-CM | POA: Diagnosis not present

## 2022-08-29 DIAGNOSIS — N181 Chronic kidney disease, stage 1: Secondary | ICD-10-CM | POA: Diagnosis not present

## 2022-08-29 DIAGNOSIS — M6281 Muscle weakness (generalized): Secondary | ICD-10-CM | POA: Diagnosis not present

## 2022-08-29 DIAGNOSIS — D649 Anemia, unspecified: Secondary | ICD-10-CM | POA: Diagnosis not present

## 2022-08-30 DIAGNOSIS — R410 Disorientation, unspecified: Secondary | ICD-10-CM | POA: Diagnosis not present

## 2022-08-30 DIAGNOSIS — M6281 Muscle weakness (generalized): Secondary | ICD-10-CM | POA: Diagnosis not present

## 2022-08-30 DIAGNOSIS — U071 COVID-19: Secondary | ICD-10-CM | POA: Diagnosis not present

## 2022-08-31 DIAGNOSIS — E782 Mixed hyperlipidemia: Secondary | ICD-10-CM | POA: Diagnosis not present

## 2022-08-31 DIAGNOSIS — E1140 Type 2 diabetes mellitus with diabetic neuropathy, unspecified: Secondary | ICD-10-CM | POA: Diagnosis not present

## 2022-08-31 DIAGNOSIS — R52 Pain, unspecified: Secondary | ICD-10-CM | POA: Diagnosis not present

## 2022-08-31 DIAGNOSIS — M6281 Muscle weakness (generalized): Secondary | ICD-10-CM | POA: Diagnosis not present

## 2022-08-31 DIAGNOSIS — L97821 Non-pressure chronic ulcer of other part of left lower leg limited to breakdown of skin: Secondary | ICD-10-CM | POA: Diagnosis not present

## 2022-08-31 DIAGNOSIS — U071 COVID-19: Secondary | ICD-10-CM | POA: Diagnosis not present

## 2022-09-01 DIAGNOSIS — U071 COVID-19: Secondary | ICD-10-CM | POA: Diagnosis not present

## 2022-09-01 DIAGNOSIS — R059 Cough, unspecified: Secondary | ICD-10-CM | POA: Diagnosis not present

## 2022-09-02 ENCOUNTER — Telehealth: Payer: Self-pay | Admitting: Vascular Surgery

## 2022-09-02 DIAGNOSIS — U071 COVID-19: Secondary | ICD-10-CM | POA: Diagnosis not present

## 2022-09-02 DIAGNOSIS — S81802D Unspecified open wound, left lower leg, subsequent encounter: Secondary | ICD-10-CM | POA: Diagnosis not present

## 2022-09-07 DIAGNOSIS — M79605 Pain in left leg: Secondary | ICD-10-CM | POA: Diagnosis not present

## 2022-09-07 DIAGNOSIS — U071 COVID-19: Secondary | ICD-10-CM | POA: Diagnosis not present

## 2022-09-07 DIAGNOSIS — M6281 Muscle weakness (generalized): Secondary | ICD-10-CM | POA: Diagnosis not present

## 2022-09-08 DIAGNOSIS — R062 Wheezing: Secondary | ICD-10-CM | POA: Diagnosis not present

## 2022-09-08 DIAGNOSIS — Z8616 Personal history of COVID-19: Secondary | ICD-10-CM | POA: Diagnosis not present

## 2022-09-08 DIAGNOSIS — S81802D Unspecified open wound, left lower leg, subsequent encounter: Secondary | ICD-10-CM | POA: Diagnosis not present

## 2022-09-08 DIAGNOSIS — M6281 Muscle weakness (generalized): Secondary | ICD-10-CM | POA: Diagnosis not present

## 2022-09-09 DIAGNOSIS — L97821 Non-pressure chronic ulcer of other part of left lower leg limited to breakdown of skin: Secondary | ICD-10-CM | POA: Diagnosis not present

## 2022-09-09 DIAGNOSIS — L97811 Non-pressure chronic ulcer of other part of right lower leg limited to breakdown of skin: Secondary | ICD-10-CM | POA: Diagnosis not present

## 2022-09-09 DIAGNOSIS — L97509 Non-pressure chronic ulcer of other part of unspecified foot with unspecified severity: Secondary | ICD-10-CM | POA: Diagnosis not present

## 2022-09-09 DIAGNOSIS — E782 Mixed hyperlipidemia: Secondary | ICD-10-CM | POA: Diagnosis not present

## 2022-09-09 DIAGNOSIS — E1140 Type 2 diabetes mellitus with diabetic neuropathy, unspecified: Secondary | ICD-10-CM | POA: Diagnosis not present

## 2022-09-10 DIAGNOSIS — S81801D Unspecified open wound, right lower leg, subsequent encounter: Secondary | ICD-10-CM | POA: Diagnosis not present

## 2022-09-10 DIAGNOSIS — I739 Peripheral vascular disease, unspecified: Secondary | ICD-10-CM | POA: Diagnosis not present

## 2022-09-10 DIAGNOSIS — I509 Heart failure, unspecified: Secondary | ICD-10-CM | POA: Diagnosis not present

## 2022-09-10 DIAGNOSIS — S81802D Unspecified open wound, left lower leg, subsequent encounter: Secondary | ICD-10-CM | POA: Diagnosis not present

## 2022-09-10 DIAGNOSIS — M6281 Muscle weakness (generalized): Secondary | ICD-10-CM | POA: Diagnosis not present

## 2022-09-10 DIAGNOSIS — R52 Pain, unspecified: Secondary | ICD-10-CM | POA: Diagnosis not present

## 2022-09-13 DIAGNOSIS — H109 Unspecified conjunctivitis: Secondary | ICD-10-CM | POA: Diagnosis not present

## 2022-09-13 DIAGNOSIS — U071 COVID-19: Secondary | ICD-10-CM | POA: Diagnosis not present

## 2022-09-13 DIAGNOSIS — G928 Other toxic encephalopathy: Secondary | ICD-10-CM | POA: Diagnosis not present

## 2022-09-13 DIAGNOSIS — Z23 Encounter for immunization: Secondary | ICD-10-CM | POA: Diagnosis not present

## 2022-09-13 DIAGNOSIS — M6281 Muscle weakness (generalized): Secondary | ICD-10-CM | POA: Diagnosis not present

## 2022-09-13 DIAGNOSIS — I69811 Memory deficit following other cerebrovascular disease: Secondary | ICD-10-CM | POA: Diagnosis not present

## 2022-09-14 DIAGNOSIS — L97811 Non-pressure chronic ulcer of other part of right lower leg limited to breakdown of skin: Secondary | ICD-10-CM | POA: Diagnosis not present

## 2022-09-14 DIAGNOSIS — L97821 Non-pressure chronic ulcer of other part of left lower leg limited to breakdown of skin: Secondary | ICD-10-CM | POA: Diagnosis not present

## 2022-09-14 DIAGNOSIS — E782 Mixed hyperlipidemia: Secondary | ICD-10-CM | POA: Diagnosis not present

## 2022-09-14 DIAGNOSIS — E1140 Type 2 diabetes mellitus with diabetic neuropathy, unspecified: Secondary | ICD-10-CM | POA: Diagnosis not present

## 2022-09-15 DIAGNOSIS — M6281 Muscle weakness (generalized): Secondary | ICD-10-CM | POA: Diagnosis not present

## 2022-09-15 DIAGNOSIS — I69811 Memory deficit following other cerebrovascular disease: Secondary | ICD-10-CM | POA: Diagnosis not present

## 2022-09-15 DIAGNOSIS — M79605 Pain in left leg: Secondary | ICD-10-CM | POA: Diagnosis not present

## 2022-09-15 DIAGNOSIS — I739 Peripheral vascular disease, unspecified: Secondary | ICD-10-CM | POA: Diagnosis not present

## 2022-09-15 DIAGNOSIS — H109 Unspecified conjunctivitis: Secondary | ICD-10-CM | POA: Diagnosis not present

## 2022-09-15 DIAGNOSIS — S81802D Unspecified open wound, left lower leg, subsequent encounter: Secondary | ICD-10-CM | POA: Diagnosis not present

## 2022-09-15 DIAGNOSIS — Z23 Encounter for immunization: Secondary | ICD-10-CM | POA: Diagnosis not present

## 2022-09-15 DIAGNOSIS — S81801D Unspecified open wound, right lower leg, subsequent encounter: Secondary | ICD-10-CM | POA: Diagnosis not present

## 2022-09-15 DIAGNOSIS — G928 Other toxic encephalopathy: Secondary | ICD-10-CM | POA: Diagnosis not present

## 2022-09-18 DIAGNOSIS — M79604 Pain in right leg: Secondary | ICD-10-CM | POA: Diagnosis not present

## 2022-09-18 DIAGNOSIS — M79605 Pain in left leg: Secondary | ICD-10-CM | POA: Diagnosis not present

## 2022-09-19 DIAGNOSIS — H5789 Other specified disorders of eye and adnexa: Secondary | ICD-10-CM | POA: Diagnosis not present

## 2022-09-19 DIAGNOSIS — M79604 Pain in right leg: Secondary | ICD-10-CM | POA: Diagnosis not present

## 2022-09-19 DIAGNOSIS — Z79899 Other long term (current) drug therapy: Secondary | ICD-10-CM | POA: Diagnosis not present

## 2022-09-19 DIAGNOSIS — M79605 Pain in left leg: Secondary | ICD-10-CM | POA: Diagnosis not present

## 2022-09-19 DIAGNOSIS — R0981 Nasal congestion: Secondary | ICD-10-CM | POA: Diagnosis not present

## 2022-09-20 DIAGNOSIS — E1140 Type 2 diabetes mellitus with diabetic neuropathy, unspecified: Secondary | ICD-10-CM | POA: Diagnosis not present

## 2022-09-20 DIAGNOSIS — M6281 Muscle weakness (generalized): Secondary | ICD-10-CM | POA: Diagnosis not present

## 2022-09-21 DIAGNOSIS — E1140 Type 2 diabetes mellitus with diabetic neuropathy, unspecified: Secondary | ICD-10-CM | POA: Diagnosis not present

## 2022-09-21 DIAGNOSIS — L97811 Non-pressure chronic ulcer of other part of right lower leg limited to breakdown of skin: Secondary | ICD-10-CM | POA: Diagnosis not present

## 2022-09-21 DIAGNOSIS — S80211A Abrasion, right knee, initial encounter: Secondary | ICD-10-CM | POA: Diagnosis not present

## 2022-09-21 DIAGNOSIS — L97821 Non-pressure chronic ulcer of other part of left lower leg limited to breakdown of skin: Secondary | ICD-10-CM | POA: Diagnosis not present

## 2022-09-23 ENCOUNTER — Other Ambulatory Visit: Payer: Self-pay | Admitting: *Deleted

## 2022-09-23 DIAGNOSIS — M6281 Muscle weakness (generalized): Secondary | ICD-10-CM | POA: Diagnosis not present

## 2022-09-23 DIAGNOSIS — R8290 Unspecified abnormal findings in urine: Secondary | ICD-10-CM | POA: Diagnosis not present

## 2022-09-23 NOTE — Patient Outreach (Signed)
[name] resides in [facility] skilled nursing facility. Screening for potential care coordination/chronic care management services as a benefit of health plan and primary care provider.  Previous update from [facility] social worker. [name] transition plan is for long term care.   No identifiable care coordination needs at this time.   Kenneth Noble, MSN, RN, BSN [place]  [facility] RN Post- Acute Care Coordinator Direct Dial: [phone]

## 2022-09-25 DIAGNOSIS — R8290 Unspecified abnormal findings in urine: Secondary | ICD-10-CM | POA: Diagnosis not present

## 2022-09-25 DIAGNOSIS — R10819 Abdominal tenderness, unspecified site: Secondary | ICD-10-CM | POA: Diagnosis not present

## 2022-09-25 DIAGNOSIS — M6281 Muscle weakness (generalized): Secondary | ICD-10-CM | POA: Diagnosis not present

## 2022-09-27 DIAGNOSIS — M6281 Muscle weakness (generalized): Secondary | ICD-10-CM | POA: Diagnosis not present

## 2022-09-27 DIAGNOSIS — D649 Anemia, unspecified: Secondary | ICD-10-CM | POA: Diagnosis not present

## 2022-09-27 DIAGNOSIS — N390 Urinary tract infection, site not specified: Secondary | ICD-10-CM | POA: Diagnosis not present

## 2022-09-27 DIAGNOSIS — N181 Chronic kidney disease, stage 1: Secondary | ICD-10-CM | POA: Diagnosis not present

## 2022-09-28 ENCOUNTER — Telehealth: Payer: Self-pay

## 2022-09-28 DIAGNOSIS — L97811 Non-pressure chronic ulcer of other part of right lower leg limited to breakdown of skin: Secondary | ICD-10-CM | POA: Diagnosis not present

## 2022-09-28 DIAGNOSIS — S81811A Laceration without foreign body, right lower leg, initial encounter: Secondary | ICD-10-CM | POA: Diagnosis not present

## 2022-09-28 DIAGNOSIS — E1140 Type 2 diabetes mellitus with diabetic neuropathy, unspecified: Secondary | ICD-10-CM | POA: Diagnosis not present

## 2022-09-28 DIAGNOSIS — M6281 Muscle weakness (generalized): Secondary | ICD-10-CM | POA: Diagnosis not present

## 2022-09-28 DIAGNOSIS — S80211A Abrasion, right knee, initial encounter: Secondary | ICD-10-CM | POA: Diagnosis not present

## 2022-09-28 DIAGNOSIS — N390 Urinary tract infection, site not specified: Secondary | ICD-10-CM | POA: Diagnosis not present

## 2022-09-28 DIAGNOSIS — L97821 Non-pressure chronic ulcer of other part of left lower leg limited to breakdown of skin: Secondary | ICD-10-CM | POA: Diagnosis not present

## 2022-09-28 NOTE — Telephone Encounter (Signed)
Cloretta Ned, RN with [facility] called stating that patient family stated that he was supposed to be on Amoxicillin prescribed by Dr.Comer. Patient last seen in 08/2021 and was not seen by ID while admitted in hospital in 08/2022. Advised patient would need an appointment to be evaluated to determine if abx are needed.    Deshawna Mcneece [name], CMA

## 2022-09-29 NOTE — Telephone Encounter (Signed)
[name] aware patient shouldn't be on Amoxicillin per Dr.Comer - stopped abx 1 year ago.   [name] [name], CMA

## 2022-09-30 DIAGNOSIS — M6281 Muscle weakness (generalized): Secondary | ICD-10-CM | POA: Diagnosis not present

## 2022-09-30 DIAGNOSIS — M79605 Pain in left leg: Secondary | ICD-10-CM | POA: Diagnosis not present

## 2022-09-30 DIAGNOSIS — M79604 Pain in right leg: Secondary | ICD-10-CM | POA: Diagnosis not present

## 2022-10-04 DIAGNOSIS — I69811 Memory deficit following other cerebrovascular disease: Secondary | ICD-10-CM | POA: Diagnosis not present

## 2022-10-04 DIAGNOSIS — G928 Other toxic encephalopathy: Secondary | ICD-10-CM | POA: Diagnosis not present

## 2022-10-04 DIAGNOSIS — Z23 Encounter for immunization: Secondary | ICD-10-CM | POA: Diagnosis not present

## 2022-10-04 DIAGNOSIS — M6281 Muscle weakness (generalized): Secondary | ICD-10-CM | POA: Diagnosis not present

## 2022-10-05 DIAGNOSIS — E1140 Type 2 diabetes mellitus with diabetic neuropathy, unspecified: Secondary | ICD-10-CM | POA: Diagnosis not present

## 2022-10-05 DIAGNOSIS — Z23 Encounter for immunization: Secondary | ICD-10-CM | POA: Diagnosis not present

## 2022-10-05 DIAGNOSIS — G928 Other toxic encephalopathy: Secondary | ICD-10-CM | POA: Diagnosis not present

## 2022-10-05 DIAGNOSIS — I69811 Memory deficit following other cerebrovascular disease: Secondary | ICD-10-CM | POA: Diagnosis not present

## 2022-10-05 DIAGNOSIS — S80211A Abrasion, right knee, initial encounter: Secondary | ICD-10-CM | POA: Diagnosis not present

## 2022-10-05 DIAGNOSIS — M6281 Muscle weakness (generalized): Secondary | ICD-10-CM | POA: Diagnosis not present

## 2022-10-05 DIAGNOSIS — L97821 Non-pressure chronic ulcer of other part of left lower leg limited to breakdown of skin: Secondary | ICD-10-CM | POA: Diagnosis not present

## 2022-10-05 DIAGNOSIS — L97811 Non-pressure chronic ulcer of other part of right lower leg limited to breakdown of skin: Secondary | ICD-10-CM | POA: Diagnosis not present

## 2022-10-06 DIAGNOSIS — M6281 Muscle weakness (generalized): Secondary | ICD-10-CM | POA: Diagnosis not present

## 2022-10-06 DIAGNOSIS — I6930 Unspecified sequelae of cerebral infarction: Secondary | ICD-10-CM | POA: Diagnosis not present

## 2022-10-06 DIAGNOSIS — Z23 Encounter for immunization: Secondary | ICD-10-CM | POA: Diagnosis not present

## 2022-10-06 DIAGNOSIS — I69811 Memory deficit following other cerebrovascular disease: Secondary | ICD-10-CM | POA: Diagnosis not present

## 2022-10-06 DIAGNOSIS — R2689 Other abnormalities of gait and mobility: Secondary | ICD-10-CM | POA: Diagnosis not present

## 2022-10-09 ENCOUNTER — Encounter (HOSPITAL_COMMUNITY): Payer: Medicare Other

## 2022-10-09 ENCOUNTER — Ambulatory Visit: Payer: Medicare Other | Admitting: Vascular Surgery

## 2022-10-09 DIAGNOSIS — M6281 Muscle weakness (generalized): Secondary | ICD-10-CM | POA: Diagnosis not present

## 2022-10-09 DIAGNOSIS — S81802A Unspecified open wound, left lower leg, initial encounter: Secondary | ICD-10-CM | POA: Diagnosis not present

## 2022-10-09 DIAGNOSIS — Z23 Encounter for immunization: Secondary | ICD-10-CM | POA: Diagnosis not present

## 2022-10-09 DIAGNOSIS — I6930 Unspecified sequelae of cerebral infarction: Secondary | ICD-10-CM | POA: Diagnosis not present

## 2022-10-09 DIAGNOSIS — I69811 Memory deficit following other cerebrovascular disease: Secondary | ICD-10-CM | POA: Diagnosis not present

## 2022-10-09 DIAGNOSIS — R2689 Other abnormalities of gait and mobility: Secondary | ICD-10-CM | POA: Diagnosis not present

## 2022-10-09 DIAGNOSIS — L853 Xerosis cutis: Secondary | ICD-10-CM | POA: Diagnosis not present

## 2022-10-10 DIAGNOSIS — R2689 Other abnormalities of gait and mobility: Secondary | ICD-10-CM | POA: Diagnosis not present

## 2022-10-10 DIAGNOSIS — I6930 Unspecified sequelae of cerebral infarction: Secondary | ICD-10-CM | POA: Diagnosis not present

## 2022-10-10 DIAGNOSIS — M6281 Muscle weakness (generalized): Secondary | ICD-10-CM | POA: Diagnosis not present

## 2022-10-10 DIAGNOSIS — M79604 Pain in right leg: Secondary | ICD-10-CM | POA: Diagnosis not present

## 2022-10-10 DIAGNOSIS — I69811 Memory deficit following other cerebrovascular disease: Secondary | ICD-10-CM | POA: Diagnosis not present

## 2022-10-10 DIAGNOSIS — M79605 Pain in left leg: Secondary | ICD-10-CM | POA: Diagnosis not present

## 2022-10-10 DIAGNOSIS — Z23 Encounter for immunization: Secondary | ICD-10-CM | POA: Diagnosis not present

## 2022-10-11 DIAGNOSIS — I69811 Memory deficit following other cerebrovascular disease: Secondary | ICD-10-CM | POA: Diagnosis not present

## 2022-10-11 DIAGNOSIS — R2689 Other abnormalities of gait and mobility: Secondary | ICD-10-CM | POA: Diagnosis not present

## 2022-10-11 DIAGNOSIS — I6930 Unspecified sequelae of cerebral infarction: Secondary | ICD-10-CM | POA: Diagnosis not present

## 2022-10-11 DIAGNOSIS — M6281 Muscle weakness (generalized): Secondary | ICD-10-CM | POA: Diagnosis not present

## 2022-10-11 DIAGNOSIS — Z23 Encounter for immunization: Secondary | ICD-10-CM | POA: Diagnosis not present

## 2022-10-12 DIAGNOSIS — L97821 Non-pressure chronic ulcer of other part of left lower leg limited to breakdown of skin: Secondary | ICD-10-CM | POA: Diagnosis not present

## 2022-10-12 DIAGNOSIS — L97811 Non-pressure chronic ulcer of other part of right lower leg limited to breakdown of skin: Secondary | ICD-10-CM | POA: Diagnosis not present

## 2022-10-12 DIAGNOSIS — S80211A Abrasion, right knee, initial encounter: Secondary | ICD-10-CM | POA: Diagnosis not present

## 2022-10-12 DIAGNOSIS — Z23 Encounter for immunization: Secondary | ICD-10-CM | POA: Diagnosis not present

## 2022-10-12 DIAGNOSIS — E1140 Type 2 diabetes mellitus with diabetic neuropathy, unspecified: Secondary | ICD-10-CM | POA: Diagnosis not present

## 2022-10-12 DIAGNOSIS — M6281 Muscle weakness (generalized): Secondary | ICD-10-CM | POA: Diagnosis not present

## 2022-10-12 DIAGNOSIS — I69811 Memory deficit following other cerebrovascular disease: Secondary | ICD-10-CM | POA: Diagnosis not present

## 2022-10-12 DIAGNOSIS — I6930 Unspecified sequelae of cerebral infarction: Secondary | ICD-10-CM | POA: Diagnosis not present

## 2022-10-12 DIAGNOSIS — R2689 Other abnormalities of gait and mobility: Secondary | ICD-10-CM | POA: Diagnosis not present

## 2022-10-13 DIAGNOSIS — R29898 Other symptoms and signs involving the musculoskeletal system: Secondary | ICD-10-CM | POA: Diagnosis not present

## 2022-10-13 DIAGNOSIS — Z23 Encounter for immunization: Secondary | ICD-10-CM | POA: Diagnosis not present

## 2022-10-13 DIAGNOSIS — R2689 Other abnormalities of gait and mobility: Secondary | ICD-10-CM | POA: Diagnosis not present

## 2022-10-13 DIAGNOSIS — I6930 Unspecified sequelae of cerebral infarction: Secondary | ICD-10-CM | POA: Diagnosis not present

## 2022-10-13 DIAGNOSIS — I69811 Memory deficit following other cerebrovascular disease: Secondary | ICD-10-CM | POA: Diagnosis not present

## 2022-10-13 DIAGNOSIS — M6281 Muscle weakness (generalized): Secondary | ICD-10-CM | POA: Diagnosis not present

## 2022-10-14 DIAGNOSIS — R2689 Other abnormalities of gait and mobility: Secondary | ICD-10-CM | POA: Diagnosis not present

## 2022-10-14 DIAGNOSIS — M79605 Pain in left leg: Secondary | ICD-10-CM | POA: Diagnosis not present

## 2022-10-14 DIAGNOSIS — E559 Vitamin D deficiency, unspecified: Secondary | ICD-10-CM | POA: Diagnosis not present

## 2022-10-14 DIAGNOSIS — M79604 Pain in right leg: Secondary | ICD-10-CM | POA: Diagnosis not present

## 2022-10-14 DIAGNOSIS — M6281 Muscle weakness (generalized): Secondary | ICD-10-CM | POA: Diagnosis not present

## 2022-10-14 DIAGNOSIS — Z23 Encounter for immunization: Secondary | ICD-10-CM | POA: Diagnosis not present

## 2022-10-14 DIAGNOSIS — I69811 Memory deficit following other cerebrovascular disease: Secondary | ICD-10-CM | POA: Diagnosis not present

## 2022-10-14 DIAGNOSIS — I6930 Unspecified sequelae of cerebral infarction: Secondary | ICD-10-CM | POA: Diagnosis not present

## 2022-10-15 DIAGNOSIS — H547 Unspecified visual loss: Secondary | ICD-10-CM | POA: Diagnosis not present

## 2022-10-15 DIAGNOSIS — M6281 Muscle weakness (generalized): Secondary | ICD-10-CM | POA: Diagnosis not present

## 2022-10-16 DIAGNOSIS — R2689 Other abnormalities of gait and mobility: Secondary | ICD-10-CM | POA: Diagnosis not present

## 2022-10-16 DIAGNOSIS — M6281 Muscle weakness (generalized): Secondary | ICD-10-CM | POA: Diagnosis not present

## 2022-10-16 DIAGNOSIS — Z23 Encounter for immunization: Secondary | ICD-10-CM | POA: Diagnosis not present

## 2022-10-16 DIAGNOSIS — I69811 Memory deficit following other cerebrovascular disease: Secondary | ICD-10-CM | POA: Diagnosis not present

## 2022-10-16 DIAGNOSIS — I6930 Unspecified sequelae of cerebral infarction: Secondary | ICD-10-CM | POA: Diagnosis not present

## 2022-10-17 DIAGNOSIS — L299 Pruritus, unspecified: Secondary | ICD-10-CM | POA: Diagnosis not present

## 2022-10-17 DIAGNOSIS — M2550 Pain in unspecified joint: Secondary | ICD-10-CM | POA: Diagnosis not present

## 2022-10-17 DIAGNOSIS — G8929 Other chronic pain: Secondary | ICD-10-CM | POA: Diagnosis not present

## 2022-10-18 DIAGNOSIS — I6930 Unspecified sequelae of cerebral infarction: Secondary | ICD-10-CM | POA: Diagnosis not present

## 2022-10-18 DIAGNOSIS — I69811 Memory deficit following other cerebrovascular disease: Secondary | ICD-10-CM | POA: Diagnosis not present

## 2022-10-18 DIAGNOSIS — R2689 Other abnormalities of gait and mobility: Secondary | ICD-10-CM | POA: Diagnosis not present

## 2022-10-18 DIAGNOSIS — M6281 Muscle weakness (generalized): Secondary | ICD-10-CM | POA: Diagnosis not present

## 2022-10-18 DIAGNOSIS — Z23 Encounter for immunization: Secondary | ICD-10-CM | POA: Diagnosis not present

## 2022-10-19 DIAGNOSIS — M2550 Pain in unspecified joint: Secondary | ICD-10-CM | POA: Diagnosis not present

## 2022-10-19 DIAGNOSIS — I6930 Unspecified sequelae of cerebral infarction: Secondary | ICD-10-CM | POA: Diagnosis not present

## 2022-10-19 DIAGNOSIS — L97821 Non-pressure chronic ulcer of other part of left lower leg limited to breakdown of skin: Secondary | ICD-10-CM | POA: Diagnosis not present

## 2022-10-19 DIAGNOSIS — R2689 Other abnormalities of gait and mobility: Secondary | ICD-10-CM | POA: Diagnosis not present

## 2022-10-19 DIAGNOSIS — M6281 Muscle weakness (generalized): Secondary | ICD-10-CM | POA: Diagnosis not present

## 2022-10-19 DIAGNOSIS — E1140 Type 2 diabetes mellitus with diabetic neuropathy, unspecified: Secondary | ICD-10-CM | POA: Diagnosis not present

## 2022-10-19 DIAGNOSIS — Z23 Encounter for immunization: Secondary | ICD-10-CM | POA: Diagnosis not present

## 2022-10-19 DIAGNOSIS — L97811 Non-pressure chronic ulcer of other part of right lower leg limited to breakdown of skin: Secondary | ICD-10-CM | POA: Diagnosis not present

## 2022-10-19 DIAGNOSIS — G8929 Other chronic pain: Secondary | ICD-10-CM | POA: Diagnosis not present

## 2022-10-19 DIAGNOSIS — S80211A Abrasion, right knee, initial encounter: Secondary | ICD-10-CM | POA: Diagnosis not present

## 2022-10-19 DIAGNOSIS — I69811 Memory deficit following other cerebrovascular disease: Secondary | ICD-10-CM | POA: Diagnosis not present

## 2022-10-20 DIAGNOSIS — M6281 Muscle weakness (generalized): Secondary | ICD-10-CM | POA: Diagnosis not present

## 2022-10-20 DIAGNOSIS — G8929 Other chronic pain: Secondary | ICD-10-CM | POA: Diagnosis not present

## 2022-10-21 DIAGNOSIS — M6281 Muscle weakness (generalized): Secondary | ICD-10-CM | POA: Diagnosis not present

## 2022-10-21 DIAGNOSIS — E1140 Type 2 diabetes mellitus with diabetic neuropathy, unspecified: Secondary | ICD-10-CM | POA: Diagnosis not present

## 2022-10-21 DIAGNOSIS — R2689 Other abnormalities of gait and mobility: Secondary | ICD-10-CM | POA: Diagnosis not present

## 2022-10-21 DIAGNOSIS — I69811 Memory deficit following other cerebrovascular disease: Secondary | ICD-10-CM | POA: Diagnosis not present

## 2022-10-21 DIAGNOSIS — H547 Unspecified visual loss: Secondary | ICD-10-CM | POA: Diagnosis not present

## 2022-10-21 DIAGNOSIS — I6930 Unspecified sequelae of cerebral infarction: Secondary | ICD-10-CM | POA: Diagnosis not present

## 2022-10-21 DIAGNOSIS — J439 Emphysema, unspecified: Secondary | ICD-10-CM | POA: Diagnosis not present

## 2022-10-21 DIAGNOSIS — I509 Heart failure, unspecified: Secondary | ICD-10-CM | POA: Diagnosis not present

## 2022-10-21 DIAGNOSIS — Z23 Encounter for immunization: Secondary | ICD-10-CM | POA: Diagnosis not present

## 2022-10-23 DIAGNOSIS — M6281 Muscle weakness (generalized): Secondary | ICD-10-CM | POA: Diagnosis not present

## 2022-10-23 DIAGNOSIS — I69811 Memory deficit following other cerebrovascular disease: Secondary | ICD-10-CM | POA: Diagnosis not present

## 2022-10-23 DIAGNOSIS — Z23 Encounter for immunization: Secondary | ICD-10-CM | POA: Diagnosis not present

## 2022-10-23 DIAGNOSIS — R2689 Other abnormalities of gait and mobility: Secondary | ICD-10-CM | POA: Diagnosis not present

## 2022-10-23 DIAGNOSIS — I6930 Unspecified sequelae of cerebral infarction: Secondary | ICD-10-CM | POA: Diagnosis not present

## 2022-10-24 DIAGNOSIS — Z23 Encounter for immunization: Secondary | ICD-10-CM | POA: Diagnosis not present

## 2022-10-24 DIAGNOSIS — I6930 Unspecified sequelae of cerebral infarction: Secondary | ICD-10-CM | POA: Diagnosis not present

## 2022-10-24 DIAGNOSIS — M6281 Muscle weakness (generalized): Secondary | ICD-10-CM | POA: Diagnosis not present

## 2022-10-24 DIAGNOSIS — I69811 Memory deficit following other cerebrovascular disease: Secondary | ICD-10-CM | POA: Diagnosis not present

## 2022-10-24 DIAGNOSIS — R2689 Other abnormalities of gait and mobility: Secondary | ICD-10-CM | POA: Diagnosis not present

## 2022-10-26 DIAGNOSIS — I10 Essential (primary) hypertension: Secondary | ICD-10-CM | POA: Diagnosis not present

## 2022-10-26 DIAGNOSIS — M6281 Muscle weakness (generalized): Secondary | ICD-10-CM | POA: Diagnosis not present

## 2022-10-28 DIAGNOSIS — E782 Mixed hyperlipidemia: Secondary | ICD-10-CM | POA: Diagnosis not present

## 2022-10-28 DIAGNOSIS — L97821 Non-pressure chronic ulcer of other part of left lower leg limited to breakdown of skin: Secondary | ICD-10-CM | POA: Diagnosis not present

## 2022-10-28 DIAGNOSIS — E1140 Type 2 diabetes mellitus with diabetic neuropathy, unspecified: Secondary | ICD-10-CM | POA: Diagnosis not present

## 2022-10-29 DIAGNOSIS — Z79899 Other long term (current) drug therapy: Secondary | ICD-10-CM | POA: Diagnosis not present

## 2022-11-02 DIAGNOSIS — E782 Mixed hyperlipidemia: Secondary | ICD-10-CM | POA: Diagnosis not present

## 2022-11-02 DIAGNOSIS — L97821 Non-pressure chronic ulcer of other part of left lower leg limited to breakdown of skin: Secondary | ICD-10-CM | POA: Diagnosis not present

## 2022-11-02 DIAGNOSIS — E1140 Type 2 diabetes mellitus with diabetic neuropathy, unspecified: Secondary | ICD-10-CM | POA: Diagnosis not present

## 2022-11-03 DIAGNOSIS — R2689 Other abnormalities of gait and mobility: Secondary | ICD-10-CM | POA: Diagnosis not present

## 2022-11-03 DIAGNOSIS — I6930 Unspecified sequelae of cerebral infarction: Secondary | ICD-10-CM | POA: Diagnosis not present

## 2022-11-03 DIAGNOSIS — I69811 Memory deficit following other cerebrovascular disease: Secondary | ICD-10-CM | POA: Diagnosis not present

## 2022-11-03 DIAGNOSIS — Z23 Encounter for immunization: Secondary | ICD-10-CM | POA: Diagnosis not present

## 2022-11-03 DIAGNOSIS — M6281 Muscle weakness (generalized): Secondary | ICD-10-CM | POA: Diagnosis not present

## 2022-11-03 NOTE — Progress Notes (Unsigned)
Office Note   History of Present Illness   [name] is a 87 y.o. ([birth year]) male who presents for wound check.  He is status post angiogram demonstrating Chronically occluded superficial femoral artery with reconstitution at the P2 segment of the popliteal artery in March this was completed due to tissue loss in the left foot.. We discussed redo left common femoral artery exposure, femoral to below-knee popliteal artery bypass on multiple occasions however [name] is not interested in more surgery.  Surgical history includes: Bilateral iliofemoral endarterectomy with bilateral common iliac artery stents on 10/09/2021 by Dr. Karin Lieu.  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  He presents today for wound check with known, mixed arteriovenous disease.  Since last seen, [name] had a hospitalization which required skilled nursing facility.  Fortunately, the diligent wound care at the nursing facility has healed his left leg wound without intervention. He is currently ambulating with a walker and walked 40 steps yesterday.  Having some issues with left knee contraction.  Denies claudication, ischemic rest pain, tissue loss improved   Current Outpatient Medications  Medication Sig Dispense Refill   aspirin EC 81 MG tablet Take 81 mg by mouth every other day. Swallow whole.     atenolol (TENORMIN) 50 MG tablet Take 50 mg by mouth daily.     clopidogrel (PLAVIX) 75 MG tablet TAKE 1 TABLET BY MOUTH ONCE DAILY AT 6 AM (Patient taking differently: Take 75 mg by mouth daily.) 90 tablet 1   fluticasone (FLONASE) 50 MCG/ACT nasal spray Place 2 sprays into both nostrils as needed for allergies.     furosemide (LASIX) 20 MG tablet Take 20 mg by mouth daily.     hydrALAZINE (APRESOLINE) 25 MG tablet Take 1 tablet (25 mg total) by mouth every 8 (eight) hours.     metFORMIN (GLUCOPHAGE) 1000 MG tablet Take 1,000 mg by mouth in the morning and at bedtime.     Misc  Natural Products (BEET ROOT PO) Take 1 tablet by mouth daily.     Multiple Vitamin (MULTIVITAMIN WITH MINERALS) TABS tablet Take 1 tablet by mouth daily.     protein supplement shake (PREMIER PROTEIN) LIQD Take 11 oz by mouth daily.     rosuvastatin (CRESTOR) 10 MG tablet Take 10 mg by mouth daily.     tamsulosin (FLOMAX) 0.4 MG CAPS capsule Take 1 capsule (0.4 mg total) by mouth daily after breakfast.     Tetrahydrozoline HCl (VISINE OP) Place 1 drop into both eyes daily as needed (burning). (Patient not taking: Reported on 08/18/2022)     ULORIC 80 MG TABS Take 80 mg by mouth daily.  3   No current facility-administered medications for this visit.    REVIEW OF SYSTEMS (negative unless checked):   Cardiac:  []  Chest pain or chest pressure? []  Shortness of breath upon activity? []  Shortness of breath when lying flat? []  Irregular heart rhythm?  Vascular:  []  Pain in calf, thigh, or hip brought on by walking? []  Pain in feet at night that wakes you up from your sleep? []  Blood clot in your veins? []  Leg swelling?  Pulmonary:  []  Oxygen at home? []  Productive cough? []  Wheezing?  Neurologic:  []  Sudden weakness in arms or legs? []  Sudden numbness in arms or legs? []  Sudden onset of difficult speaking or slurred speech? []  Temporary loss of vision in one eye? []  Problems with dizziness?  Gastrointestinal:  []  Blood in  stool? []  Vomited blood?  Genitourinary:  []  Burning when urinating? []  Blood in urine?  Psychiatric:  []  Major depression  Hematologic:  []  Bleeding problems? []  Problems with blood clotting?  Dermatologic:  []  Rashes or ulcers?  Constitutional:  []  Fever or chills?  Ear/Nose/Throat:  []  Change in hearing? []  Nose bleeds? []  Sore throat?  Musculoskeletal:  []  Back pain? []  Joint pain? []  Muscle pain?   Physical Examination   There were no vitals filed for this visit.  There is no height or weight on file to calculate BMI.  General:   WDWN in NAD; vital signs documented above Gait: Not observed HENT: WNL, normocephalic Pulmonary: normal non-labored breathing  Cardiac: regular rate and rhythm Abdomen: soft, NT, no masses Skin: without rashes Vascular Exam/Pulses: monophasic DP/PT doppler signals on the left. Biphasic DP/PT doppler signals on the right Extremities: Right great toe ulceration with improved appearance and more superficial.  Right ankle wound improved in appearance with scab.  Left anterior distal shin wound with no change in appearance. Edematous lower legs bilaterally Musculoskeletal: no muscle wasting or atrophy  Neurologic: A&O X 3;  No focal weakness or paresthesias are detected Psychiatric:  The pt has Normal affect.     Non-Invasive Vascular imaging   Abdominal Aorta Findings:  +-------------+-------+----------+----------+--------+--------+--------+  Location    AP (cm)Trans (cm)PSV (cm/s)WaveformThrombusComments  +-------------+-------+----------+----------+--------+--------+--------+  Proximal                     57                                  +-------------+-------+----------+----------+--------+--------+--------+  Mid                          39                                  +-------------+-------+----------+----------+--------+--------+--------+  Distal                       44                                  +-------------+-------+----------+----------+--------+--------+--------+  RT EIA Prox                   143                                 +-------------+-------+----------+----------+--------+--------+--------+  RT EIA Mid                    185                                 +-------------+-------+----------+----------+--------+--------+--------+  RT EIA Distal                 96                                  +-------------+-------+----------+----------+--------+--------+--------+  LT EIA Prox  96                                  +-------------+-------+----------+----------+--------+--------+--------+  LT EIA Mid                    76                                  +-------------+-------+----------+----------+--------+--------+--------+  LT EIA Distal                 66                                  +-------------+-------+----------+----------+--------+--------+--------+     Right Stent(s):  +---------------+--------+---------------+--------+--------+  CIA           PSV cm/sStenosis       WaveformComments  +---------------+--------+---------------+--------+--------+  Prox to Stent  46                                       +---------------+--------+---------------+--------+--------+  Proximal Stent 161                    biphasic          +---------------+--------+---------------+--------+--------+  Mid Stent      121                    biphasic          +---------------+--------+---------------+--------+--------+  Distal Stent   220     50-99% stenosisbiphasic          +---------------+--------+---------------+--------+--------+  Distal to Stent138                                      +---------------+--------+---------------+--------+--------+      Left Stent(s):  +---------------+---++++  Prox to Stent  46   +---------------+---++++  Proximal Stent 114  +---------------+---++++  Mid Stent      71   +---------------+---++++  Distal Stent   72   +---------------+---++++  Distal to Stent95   +---------------+---++++     Summary:  Stenosis: +------------------+---------------+  Location          Stent            +------------------+---------------+  Right Common Iliac50-99% stenosis  +------------------+---------------+  Left Common Iliac 1-49% stenosis   +------------------+---------------+    Medical Decision Making   MICHOEL HOHIMER is a 87 y.o. male who  presents for wound check.  The wound has improved dramatically.  See picture above.    On physical exam, [name] had palpable femoral pulses bilaterally. ABI was reviewed demonstrating decreased from last visit, however when evaluating previous ABIs, it is still within range.  Last angio in March demonstrated no flow-limiting stenosis.  Aortoiliac duplex today demonstrated a small amount of stenosis bilaterally, velocities were not high enough to warrant intervention, especially in the setting of palpable femoral pulses which were appreciated in the sitting position.  I congratulated [name] and his daughter on healing his left leg wounds.  Very small residual wound which appears to be healed under the eschar.  We discussed the continued importance of elevation  and compression.  My plan is to follow his stents in 38-month intervals.  I asked him to call should any questions or concerns arise.  At his next visit I also will also evaluate his carotid arteries as it has been a year and he has known moderate stenosis bilaterally.  Continue current medications.  Victorino Sparrow MD  Total time of patient care including pre-visit research, consultation, and documentation greater than 30 minutes

## 2022-11-04 DIAGNOSIS — I69811 Memory deficit following other cerebrovascular disease: Secondary | ICD-10-CM | POA: Diagnosis not present

## 2022-11-04 DIAGNOSIS — Z23 Encounter for immunization: Secondary | ICD-10-CM | POA: Diagnosis not present

## 2022-11-04 DIAGNOSIS — R2689 Other abnormalities of gait and mobility: Secondary | ICD-10-CM | POA: Diagnosis not present

## 2022-11-04 DIAGNOSIS — M6281 Muscle weakness (generalized): Secondary | ICD-10-CM | POA: Diagnosis not present

## 2022-11-04 DIAGNOSIS — I6930 Unspecified sequelae of cerebral infarction: Secondary | ICD-10-CM | POA: Diagnosis not present

## 2022-11-05 ENCOUNTER — Ambulatory Visit (HOSPITAL_COMMUNITY)
Admission: RE | Admit: 2022-11-05 | Discharge: 2022-11-05 | Disposition: A | Discharge status: Home / Self Care | Payer: Medicare Other | Source: Ambulatory Visit | Attending: Vascular Surgery | Admitting: Vascular Surgery

## 2022-11-05 ENCOUNTER — Encounter: Payer: Self-pay | Admitting: Vascular Surgery

## 2022-11-05 ENCOUNTER — Ambulatory Visit (INDEPENDENT_AMBULATORY_CARE_PROVIDER_SITE_OTHER)
Admission: RE | Admit: 2022-11-05 | Discharge: 2022-11-05 | Disposition: A | Discharge status: Home / Self Care | Payer: Medicare Other | Source: Ambulatory Visit | Attending: Vascular Surgery | Admitting: Vascular Surgery

## 2022-11-05 ENCOUNTER — Ambulatory Visit (INDEPENDENT_AMBULATORY_CARE_PROVIDER_SITE_OTHER): Payer: Medicare Other | Admitting: Vascular Surgery

## 2022-11-05 VITALS — BP 168/91 | HR 64 | Temp 98.0°F | Resp 20 | Ht 68.0 in | Wt 134.2 lb

## 2022-11-05 DIAGNOSIS — Z95828 Presence of other vascular implants and grafts: Secondary | ICD-10-CM

## 2022-11-05 DIAGNOSIS — I70223 Atherosclerosis of native arteries of extremities with rest pain, bilateral legs: Secondary | ICD-10-CM

## 2022-11-05 DIAGNOSIS — R2689 Other abnormalities of gait and mobility: Secondary | ICD-10-CM | POA: Diagnosis not present

## 2022-11-05 DIAGNOSIS — I6930 Unspecified sequelae of cerebral infarction: Secondary | ICD-10-CM | POA: Diagnosis not present

## 2022-11-05 DIAGNOSIS — M6281 Muscle weakness (generalized): Secondary | ICD-10-CM | POA: Diagnosis not present

## 2022-11-05 DIAGNOSIS — Z23 Encounter for immunization: Secondary | ICD-10-CM | POA: Diagnosis not present

## 2022-11-05 DIAGNOSIS — I872 Venous insufficiency (chronic) (peripheral): Secondary | ICD-10-CM | POA: Diagnosis not present

## 2022-11-05 DIAGNOSIS — I70242 Atherosclerosis of native arteries of left leg with ulceration of calf: Secondary | ICD-10-CM | POA: Diagnosis not present

## 2022-11-05 DIAGNOSIS — I7025 Atherosclerosis of native arteries of other extremities with ulceration: Secondary | ICD-10-CM

## 2022-11-05 DIAGNOSIS — I69811 Memory deficit following other cerebrovascular disease: Secondary | ICD-10-CM | POA: Diagnosis not present

## 2022-11-05 DIAGNOSIS — I739 Peripheral vascular disease, unspecified: Secondary | ICD-10-CM

## 2022-11-05 LAB — VAS US ABI WITH/WO TBI
Left ABI: 0.42
Right ABI: 0.76

## 2022-11-06 DIAGNOSIS — G8929 Other chronic pain: Secondary | ICD-10-CM | POA: Diagnosis not present

## 2022-11-06 DIAGNOSIS — M6281 Muscle weakness (generalized): Secondary | ICD-10-CM | POA: Diagnosis not present

## 2022-11-06 DIAGNOSIS — Z79899 Other long term (current) drug therapy: Secondary | ICD-10-CM | POA: Diagnosis not present

## 2022-11-06 NOTE — Telephone Encounter (Signed)
Closing encounter

## 2022-11-07 DIAGNOSIS — I10 Essential (primary) hypertension: Secondary | ICD-10-CM | POA: Diagnosis not present

## 2022-11-07 DIAGNOSIS — R2689 Other abnormalities of gait and mobility: Secondary | ICD-10-CM | POA: Diagnosis not present

## 2022-11-07 DIAGNOSIS — M6281 Muscle weakness (generalized): Secondary | ICD-10-CM | POA: Diagnosis not present

## 2022-11-07 DIAGNOSIS — I6930 Unspecified sequelae of cerebral infarction: Secondary | ICD-10-CM | POA: Diagnosis not present

## 2022-11-08 DIAGNOSIS — R2689 Other abnormalities of gait and mobility: Secondary | ICD-10-CM | POA: Diagnosis not present

## 2022-11-08 DIAGNOSIS — D649 Anemia, unspecified: Secondary | ICD-10-CM | POA: Diagnosis not present

## 2022-11-08 DIAGNOSIS — M6281 Muscle weakness (generalized): Secondary | ICD-10-CM | POA: Diagnosis not present

## 2022-11-08 DIAGNOSIS — N181 Chronic kidney disease, stage 1: Secondary | ICD-10-CM | POA: Diagnosis not present

## 2022-11-08 DIAGNOSIS — I6930 Unspecified sequelae of cerebral infarction: Secondary | ICD-10-CM | POA: Diagnosis not present

## 2022-11-09 DIAGNOSIS — E1140 Type 2 diabetes mellitus with diabetic neuropathy, unspecified: Secondary | ICD-10-CM | POA: Diagnosis not present

## 2022-11-09 DIAGNOSIS — E782 Mixed hyperlipidemia: Secondary | ICD-10-CM | POA: Diagnosis not present

## 2022-11-09 DIAGNOSIS — L97821 Non-pressure chronic ulcer of other part of left lower leg limited to breakdown of skin: Secondary | ICD-10-CM | POA: Diagnosis not present

## 2022-11-10 DIAGNOSIS — I6930 Unspecified sequelae of cerebral infarction: Secondary | ICD-10-CM | POA: Diagnosis not present

## 2022-11-10 DIAGNOSIS — R2689 Other abnormalities of gait and mobility: Secondary | ICD-10-CM | POA: Diagnosis not present

## 2022-11-11 DIAGNOSIS — R2689 Other abnormalities of gait and mobility: Secondary | ICD-10-CM | POA: Diagnosis not present

## 2022-11-11 DIAGNOSIS — I6930 Unspecified sequelae of cerebral infarction: Secondary | ICD-10-CM | POA: Diagnosis not present

## 2022-11-12 DIAGNOSIS — I6930 Unspecified sequelae of cerebral infarction: Secondary | ICD-10-CM | POA: Diagnosis not present

## 2022-11-12 DIAGNOSIS — R2689 Other abnormalities of gait and mobility: Secondary | ICD-10-CM | POA: Diagnosis not present

## 2022-11-13 DIAGNOSIS — I6930 Unspecified sequelae of cerebral infarction: Secondary | ICD-10-CM | POA: Diagnosis not present

## 2022-11-13 DIAGNOSIS — R2689 Other abnormalities of gait and mobility: Secondary | ICD-10-CM | POA: Diagnosis not present

## 2022-11-15 DIAGNOSIS — R234 Changes in skin texture: Secondary | ICD-10-CM | POA: Diagnosis not present

## 2022-11-15 DIAGNOSIS — S41119A Laceration without foreign body of unspecified upper arm, initial encounter: Secondary | ICD-10-CM | POA: Diagnosis not present

## 2022-11-16 DIAGNOSIS — R2689 Other abnormalities of gait and mobility: Secondary | ICD-10-CM | POA: Diagnosis not present

## 2022-11-16 DIAGNOSIS — I6930 Unspecified sequelae of cerebral infarction: Secondary | ICD-10-CM | POA: Diagnosis not present

## 2022-11-17 DIAGNOSIS — I6930 Unspecified sequelae of cerebral infarction: Secondary | ICD-10-CM | POA: Diagnosis not present

## 2022-11-17 DIAGNOSIS — R2689 Other abnormalities of gait and mobility: Secondary | ICD-10-CM | POA: Diagnosis not present

## 2022-11-18 DIAGNOSIS — I6930 Unspecified sequelae of cerebral infarction: Secondary | ICD-10-CM | POA: Diagnosis not present

## 2022-11-18 DIAGNOSIS — R2689 Other abnormalities of gait and mobility: Secondary | ICD-10-CM | POA: Diagnosis not present

## 2022-11-19 DIAGNOSIS — R2689 Other abnormalities of gait and mobility: Secondary | ICD-10-CM | POA: Diagnosis not present

## 2022-11-19 DIAGNOSIS — I6930 Unspecified sequelae of cerebral infarction: Secondary | ICD-10-CM | POA: Diagnosis not present

## 2022-11-20 DIAGNOSIS — I6930 Unspecified sequelae of cerebral infarction: Secondary | ICD-10-CM | POA: Diagnosis not present

## 2022-11-20 DIAGNOSIS — L299 Pruritus, unspecified: Secondary | ICD-10-CM | POA: Diagnosis not present

## 2022-11-20 DIAGNOSIS — L853 Xerosis cutis: Secondary | ICD-10-CM | POA: Diagnosis not present

## 2022-11-20 DIAGNOSIS — M6281 Muscle weakness (generalized): Secondary | ICD-10-CM | POA: Diagnosis not present

## 2022-11-20 DIAGNOSIS — R2689 Other abnormalities of gait and mobility: Secondary | ICD-10-CM | POA: Diagnosis not present

## 2022-11-20 DIAGNOSIS — G8929 Other chronic pain: Secondary | ICD-10-CM | POA: Diagnosis not present

## 2022-11-23 ENCOUNTER — Other Ambulatory Visit: Payer: Self-pay

## 2022-11-23 DIAGNOSIS — I739 Peripheral vascular disease, unspecified: Secondary | ICD-10-CM

## 2022-11-23 DIAGNOSIS — I7025 Atherosclerosis of native arteries of other extremities with ulceration: Secondary | ICD-10-CM

## 2022-11-23 DIAGNOSIS — R2689 Other abnormalities of gait and mobility: Secondary | ICD-10-CM | POA: Diagnosis not present

## 2022-11-23 DIAGNOSIS — I6523 Occlusion and stenosis of bilateral carotid arteries: Secondary | ICD-10-CM

## 2022-11-23 DIAGNOSIS — I6930 Unspecified sequelae of cerebral infarction: Secondary | ICD-10-CM | POA: Diagnosis not present

## 2022-11-23 DIAGNOSIS — G8929 Other chronic pain: Secondary | ICD-10-CM | POA: Diagnosis not present

## 2022-11-24 DIAGNOSIS — M6281 Muscle weakness (generalized): Secondary | ICD-10-CM | POA: Diagnosis not present

## 2022-11-24 DIAGNOSIS — G8929 Other chronic pain: Secondary | ICD-10-CM | POA: Diagnosis not present

## 2022-11-24 DIAGNOSIS — I6930 Unspecified sequelae of cerebral infarction: Secondary | ICD-10-CM | POA: Diagnosis not present

## 2022-11-24 DIAGNOSIS — R2689 Other abnormalities of gait and mobility: Secondary | ICD-10-CM | POA: Diagnosis not present

## 2022-11-25 DIAGNOSIS — I6930 Unspecified sequelae of cerebral infarction: Secondary | ICD-10-CM | POA: Diagnosis not present

## 2022-11-25 DIAGNOSIS — R2689 Other abnormalities of gait and mobility: Secondary | ICD-10-CM | POA: Diagnosis not present

## 2022-11-26 DIAGNOSIS — M6281 Muscle weakness (generalized): Secondary | ICD-10-CM | POA: Diagnosis not present

## 2022-11-26 DIAGNOSIS — W19XXXA Unspecified fall, initial encounter: Secondary | ICD-10-CM | POA: Diagnosis not present

## 2022-11-26 DIAGNOSIS — R451 Restlessness and agitation: Secondary | ICD-10-CM | POA: Diagnosis not present

## 2022-11-26 DIAGNOSIS — I6930 Unspecified sequelae of cerebral infarction: Secondary | ICD-10-CM | POA: Diagnosis not present

## 2022-11-26 DIAGNOSIS — G8929 Other chronic pain: Secondary | ICD-10-CM | POA: Diagnosis not present

## 2022-11-26 DIAGNOSIS — R2689 Other abnormalities of gait and mobility: Secondary | ICD-10-CM | POA: Diagnosis not present

## 2022-11-26 DIAGNOSIS — I517 Cardiomegaly: Secondary | ICD-10-CM | POA: Diagnosis not present

## 2022-11-27 DIAGNOSIS — M6281 Muscle weakness (generalized): Secondary | ICD-10-CM | POA: Diagnosis not present

## 2022-11-27 DIAGNOSIS — R2689 Other abnormalities of gait and mobility: Secondary | ICD-10-CM | POA: Diagnosis not present

## 2022-11-27 DIAGNOSIS — I6930 Unspecified sequelae of cerebral infarction: Secondary | ICD-10-CM | POA: Diagnosis not present

## 2022-11-27 DIAGNOSIS — N390 Urinary tract infection, site not specified: Secondary | ICD-10-CM | POA: Diagnosis not present

## 2022-11-27 DIAGNOSIS — R451 Restlessness and agitation: Secondary | ICD-10-CM | POA: Diagnosis not present

## 2022-11-28 DIAGNOSIS — G8929 Other chronic pain: Secondary | ICD-10-CM | POA: Diagnosis not present

## 2022-11-28 DIAGNOSIS — R451 Restlessness and agitation: Secondary | ICD-10-CM | POA: Diagnosis not present

## 2022-11-28 DIAGNOSIS — M6281 Muscle weakness (generalized): Secondary | ICD-10-CM | POA: Diagnosis not present

## 2022-11-29 DIAGNOSIS — R234 Changes in skin texture: Secondary | ICD-10-CM | POA: Diagnosis not present

## 2022-11-29 DIAGNOSIS — R451 Restlessness and agitation: Secondary | ICD-10-CM | POA: Diagnosis not present

## 2022-11-29 DIAGNOSIS — L299 Pruritus, unspecified: Secondary | ICD-10-CM | POA: Diagnosis not present

## 2022-11-30 DIAGNOSIS — R2689 Other abnormalities of gait and mobility: Secondary | ICD-10-CM | POA: Diagnosis not present

## 2022-11-30 DIAGNOSIS — I6930 Unspecified sequelae of cerebral infarction: Secondary | ICD-10-CM | POA: Diagnosis not present

## 2022-12-01 DIAGNOSIS — G8929 Other chronic pain: Secondary | ICD-10-CM | POA: Diagnosis not present

## 2022-12-02 DIAGNOSIS — L259 Unspecified contact dermatitis, unspecified cause: Secondary | ICD-10-CM | POA: Diagnosis not present

## 2022-12-02 DIAGNOSIS — L299 Pruritus, unspecified: Secondary | ICD-10-CM | POA: Diagnosis not present

## 2022-12-02 DIAGNOSIS — Z87440 Personal history of urinary (tract) infections: Secondary | ICD-10-CM | POA: Diagnosis not present

## 2022-12-03 DIAGNOSIS — I739 Peripheral vascular disease, unspecified: Secondary | ICD-10-CM | POA: Diagnosis not present

## 2022-12-03 DIAGNOSIS — L299 Pruritus, unspecified: Secondary | ICD-10-CM | POA: Diagnosis not present

## 2022-12-03 DIAGNOSIS — L259 Unspecified contact dermatitis, unspecified cause: Secondary | ICD-10-CM | POA: Diagnosis not present

## 2022-12-03 DIAGNOSIS — E782 Mixed hyperlipidemia: Secondary | ICD-10-CM | POA: Diagnosis not present

## 2022-12-04 DIAGNOSIS — D649 Anemia, unspecified: Secondary | ICD-10-CM | POA: Diagnosis not present

## 2022-12-04 DIAGNOSIS — E7800 Pure hypercholesterolemia, unspecified: Secondary | ICD-10-CM | POA: Diagnosis not present

## 2022-12-04 DIAGNOSIS — G8929 Other chronic pain: Secondary | ICD-10-CM | POA: Diagnosis not present

## 2022-12-05 DIAGNOSIS — E782 Mixed hyperlipidemia: Secondary | ICD-10-CM | POA: Diagnosis not present

## 2022-12-05 DIAGNOSIS — I739 Peripheral vascular disease, unspecified: Secondary | ICD-10-CM | POA: Diagnosis not present

## 2022-12-05 DIAGNOSIS — Z79899 Other long term (current) drug therapy: Secondary | ICD-10-CM | POA: Diagnosis not present

## 2022-12-06 DIAGNOSIS — I509 Heart failure, unspecified: Secondary | ICD-10-CM | POA: Diagnosis not present

## 2022-12-06 DIAGNOSIS — I10 Essential (primary) hypertension: Secondary | ICD-10-CM | POA: Diagnosis not present

## 2022-12-06 DIAGNOSIS — Z8673 Personal history of transient ischemic attack (TIA), and cerebral infarction without residual deficits: Secondary | ICD-10-CM | POA: Diagnosis not present

## 2022-12-06 DIAGNOSIS — I4819 Other persistent atrial fibrillation: Secondary | ICD-10-CM | POA: Diagnosis not present

## 2022-12-06 DIAGNOSIS — I739 Peripheral vascular disease, unspecified: Secondary | ICD-10-CM | POA: Diagnosis not present

## 2022-12-17 DIAGNOSIS — I10 Essential (primary) hypertension: Secondary | ICD-10-CM | POA: Diagnosis not present

## 2022-12-17 DIAGNOSIS — L853 Xerosis cutis: Secondary | ICD-10-CM | POA: Diagnosis not present

## 2022-12-21 DIAGNOSIS — M6281 Muscle weakness (generalized): Secondary | ICD-10-CM | POA: Diagnosis not present

## 2022-12-25 DIAGNOSIS — G8929 Other chronic pain: Secondary | ICD-10-CM | POA: Diagnosis not present

## 2022-12-25 DIAGNOSIS — M6281 Muscle weakness (generalized): Secondary | ICD-10-CM | POA: Diagnosis not present

## 2022-12-27 DIAGNOSIS — I10 Essential (primary) hypertension: Secondary | ICD-10-CM | POA: Diagnosis not present

## 2022-12-27 DIAGNOSIS — Z8673 Personal history of transient ischemic attack (TIA), and cerebral infarction without residual deficits: Secondary | ICD-10-CM | POA: Diagnosis not present

## 2022-12-27 DIAGNOSIS — M6281 Muscle weakness (generalized): Secondary | ICD-10-CM | POA: Diagnosis not present

## 2022-12-27 DIAGNOSIS — G8929 Other chronic pain: Secondary | ICD-10-CM | POA: Diagnosis not present

## 2022-12-27 DIAGNOSIS — I4819 Other persistent atrial fibrillation: Secondary | ICD-10-CM | POA: Diagnosis not present

## 2022-12-27 DIAGNOSIS — I739 Peripheral vascular disease, unspecified: Secondary | ICD-10-CM | POA: Diagnosis not present

## 2022-12-29 DIAGNOSIS — Z7984 Long term (current) use of oral hypoglycemic drugs: Secondary | ICD-10-CM | POA: Diagnosis not present

## 2022-12-29 DIAGNOSIS — E782 Mixed hyperlipidemia: Secondary | ICD-10-CM | POA: Diagnosis not present

## 2022-12-29 DIAGNOSIS — E1151 Type 2 diabetes mellitus with diabetic peripheral angiopathy without gangrene: Secondary | ICD-10-CM | POA: Diagnosis not present

## 2022-12-29 DIAGNOSIS — Z993 Dependence on wheelchair: Secondary | ICD-10-CM | POA: Diagnosis not present

## 2022-12-29 DIAGNOSIS — Z7982 Long term (current) use of aspirin: Secondary | ICD-10-CM | POA: Diagnosis not present

## 2022-12-29 DIAGNOSIS — G928 Other toxic encephalopathy: Secondary | ICD-10-CM | POA: Diagnosis not present

## 2022-12-29 DIAGNOSIS — Z87891 Personal history of nicotine dependence: Secondary | ICD-10-CM | POA: Diagnosis not present

## 2022-12-29 DIAGNOSIS — M1990 Unspecified osteoarthritis, unspecified site: Secondary | ICD-10-CM | POA: Diagnosis not present

## 2022-12-29 DIAGNOSIS — L853 Xerosis cutis: Secondary | ICD-10-CM | POA: Diagnosis not present

## 2022-12-29 DIAGNOSIS — M109 Gout, unspecified: Secondary | ICD-10-CM | POA: Diagnosis not present

## 2022-12-29 DIAGNOSIS — I509 Heart failure, unspecified: Secondary | ICD-10-CM | POA: Diagnosis not present

## 2022-12-29 DIAGNOSIS — Z8673 Personal history of transient ischemic attack (TIA), and cerebral infarction without residual deficits: Secondary | ICD-10-CM | POA: Diagnosis not present

## 2022-12-29 DIAGNOSIS — Z96611 Presence of right artificial shoulder joint: Secondary | ICD-10-CM | POA: Diagnosis not present

## 2022-12-29 DIAGNOSIS — I87302 Chronic venous hypertension (idiopathic) without complications of left lower extremity: Secondary | ICD-10-CM | POA: Diagnosis not present

## 2022-12-29 DIAGNOSIS — I70202 Unspecified atherosclerosis of native arteries of extremities, left leg: Secondary | ICD-10-CM | POA: Diagnosis not present

## 2022-12-29 DIAGNOSIS — I110 Hypertensive heart disease with heart failure: Secondary | ICD-10-CM | POA: Diagnosis not present

## 2022-12-29 DIAGNOSIS — R918 Other nonspecific abnormal finding of lung field: Secondary | ICD-10-CM | POA: Diagnosis not present

## 2022-12-29 DIAGNOSIS — Z96642 Presence of left artificial hip joint: Secondary | ICD-10-CM | POA: Diagnosis not present

## 2022-12-29 DIAGNOSIS — I4891 Unspecified atrial fibrillation: Secondary | ICD-10-CM | POA: Diagnosis not present

## 2022-12-29 DIAGNOSIS — Z48812 Encounter for surgical aftercare following surgery on the circulatory system: Secondary | ICD-10-CM | POA: Diagnosis not present

## 2022-12-29 DIAGNOSIS — Z96612 Presence of left artificial shoulder joint: Secondary | ICD-10-CM | POA: Diagnosis not present

## 2022-12-29 DIAGNOSIS — Z7902 Long term (current) use of antithrombotics/antiplatelets: Secondary | ICD-10-CM | POA: Diagnosis not present

## 2023-01-05 DIAGNOSIS — G894 Chronic pain syndrome: Secondary | ICD-10-CM | POA: Diagnosis not present

## 2023-01-05 DIAGNOSIS — I10 Essential (primary) hypertension: Secondary | ICD-10-CM | POA: Diagnosis not present

## 2023-01-05 DIAGNOSIS — Z79899 Other long term (current) drug therapy: Secondary | ICD-10-CM | POA: Diagnosis not present

## 2023-01-05 DIAGNOSIS — E7800 Pure hypercholesterolemia, unspecified: Secondary | ICD-10-CM | POA: Diagnosis not present

## 2023-01-05 DIAGNOSIS — I482 Chronic atrial fibrillation, unspecified: Secondary | ICD-10-CM | POA: Diagnosis not present

## 2023-01-05 DIAGNOSIS — E1140 Type 2 diabetes mellitus with diabetic neuropathy, unspecified: Secondary | ICD-10-CM | POA: Diagnosis not present

## 2023-01-05 DIAGNOSIS — I739 Peripheral vascular disease, unspecified: Secondary | ICD-10-CM | POA: Diagnosis not present

## 2023-01-05 DIAGNOSIS — Z96649 Presence of unspecified artificial hip joint: Secondary | ICD-10-CM | POA: Diagnosis not present

## 2023-01-05 DIAGNOSIS — D6869 Other thrombophilia: Secondary | ICD-10-CM | POA: Diagnosis not present

## 2023-01-05 DIAGNOSIS — M109 Gout, unspecified: Secondary | ICD-10-CM | POA: Diagnosis not present

## 2023-01-06 DIAGNOSIS — M109 Gout, unspecified: Secondary | ICD-10-CM | POA: Diagnosis not present

## 2023-01-06 DIAGNOSIS — R918 Other nonspecific abnormal finding of lung field: Secondary | ICD-10-CM | POA: Diagnosis not present

## 2023-01-06 DIAGNOSIS — Z993 Dependence on wheelchair: Secondary | ICD-10-CM | POA: Diagnosis not present

## 2023-01-06 DIAGNOSIS — Z48812 Encounter for surgical aftercare following surgery on the circulatory system: Secondary | ICD-10-CM | POA: Diagnosis not present

## 2023-01-06 DIAGNOSIS — I509 Heart failure, unspecified: Secondary | ICD-10-CM | POA: Diagnosis not present

## 2023-01-06 DIAGNOSIS — I87302 Chronic venous hypertension (idiopathic) without complications of left lower extremity: Secondary | ICD-10-CM | POA: Diagnosis not present

## 2023-01-06 DIAGNOSIS — L853 Xerosis cutis: Secondary | ICD-10-CM | POA: Diagnosis not present

## 2023-01-06 DIAGNOSIS — I4891 Unspecified atrial fibrillation: Secondary | ICD-10-CM | POA: Diagnosis not present

## 2023-01-06 DIAGNOSIS — I70202 Unspecified atherosclerosis of native arteries of extremities, left leg: Secondary | ICD-10-CM | POA: Diagnosis not present

## 2023-01-06 DIAGNOSIS — E782 Mixed hyperlipidemia: Secondary | ICD-10-CM | POA: Diagnosis not present

## 2023-01-06 DIAGNOSIS — Z7982 Long term (current) use of aspirin: Secondary | ICD-10-CM | POA: Diagnosis not present

## 2023-01-06 DIAGNOSIS — Z7984 Long term (current) use of oral hypoglycemic drugs: Secondary | ICD-10-CM | POA: Diagnosis not present

## 2023-01-06 DIAGNOSIS — G928 Other toxic encephalopathy: Secondary | ICD-10-CM | POA: Diagnosis not present

## 2023-01-06 DIAGNOSIS — Z7902 Long term (current) use of antithrombotics/antiplatelets: Secondary | ICD-10-CM | POA: Diagnosis not present

## 2023-01-06 DIAGNOSIS — Z96612 Presence of left artificial shoulder joint: Secondary | ICD-10-CM | POA: Diagnosis not present

## 2023-01-06 DIAGNOSIS — Z87891 Personal history of nicotine dependence: Secondary | ICD-10-CM | POA: Diagnosis not present

## 2023-01-06 DIAGNOSIS — Z96642 Presence of left artificial hip joint: Secondary | ICD-10-CM | POA: Diagnosis not present

## 2023-01-06 DIAGNOSIS — Z8673 Personal history of transient ischemic attack (TIA), and cerebral infarction without residual deficits: Secondary | ICD-10-CM | POA: Diagnosis not present

## 2023-01-06 DIAGNOSIS — I110 Hypertensive heart disease with heart failure: Secondary | ICD-10-CM | POA: Diagnosis not present

## 2023-01-06 DIAGNOSIS — E1151 Type 2 diabetes mellitus with diabetic peripheral angiopathy without gangrene: Secondary | ICD-10-CM | POA: Diagnosis not present

## 2023-01-06 DIAGNOSIS — Z96611 Presence of right artificial shoulder joint: Secondary | ICD-10-CM | POA: Diagnosis not present

## 2023-01-06 DIAGNOSIS — M1990 Unspecified osteoarthritis, unspecified site: Secondary | ICD-10-CM | POA: Diagnosis not present

## 2023-01-13 DIAGNOSIS — I110 Hypertensive heart disease with heart failure: Secondary | ICD-10-CM | POA: Diagnosis not present

## 2023-01-13 DIAGNOSIS — G928 Other toxic encephalopathy: Secondary | ICD-10-CM | POA: Diagnosis not present

## 2023-01-13 DIAGNOSIS — Z96612 Presence of left artificial shoulder joint: Secondary | ICD-10-CM | POA: Diagnosis not present

## 2023-01-13 DIAGNOSIS — Z7984 Long term (current) use of oral hypoglycemic drugs: Secondary | ICD-10-CM | POA: Diagnosis not present

## 2023-01-13 DIAGNOSIS — Z993 Dependence on wheelchair: Secondary | ICD-10-CM | POA: Diagnosis not present

## 2023-01-13 DIAGNOSIS — Z87891 Personal history of nicotine dependence: Secondary | ICD-10-CM | POA: Diagnosis not present

## 2023-01-13 DIAGNOSIS — I4891 Unspecified atrial fibrillation: Secondary | ICD-10-CM | POA: Diagnosis not present

## 2023-01-13 DIAGNOSIS — Z7902 Long term (current) use of antithrombotics/antiplatelets: Secondary | ICD-10-CM | POA: Diagnosis not present

## 2023-01-13 DIAGNOSIS — I509 Heart failure, unspecified: Secondary | ICD-10-CM | POA: Diagnosis not present

## 2023-01-13 DIAGNOSIS — Z96642 Presence of left artificial hip joint: Secondary | ICD-10-CM | POA: Diagnosis not present

## 2023-01-13 DIAGNOSIS — L853 Xerosis cutis: Secondary | ICD-10-CM | POA: Diagnosis not present

## 2023-01-13 DIAGNOSIS — E1151 Type 2 diabetes mellitus with diabetic peripheral angiopathy without gangrene: Secondary | ICD-10-CM | POA: Diagnosis not present

## 2023-01-13 DIAGNOSIS — Z96611 Presence of right artificial shoulder joint: Secondary | ICD-10-CM | POA: Diagnosis not present

## 2023-01-13 DIAGNOSIS — Z8673 Personal history of transient ischemic attack (TIA), and cerebral infarction without residual deficits: Secondary | ICD-10-CM | POA: Diagnosis not present

## 2023-01-13 DIAGNOSIS — R918 Other nonspecific abnormal finding of lung field: Secondary | ICD-10-CM | POA: Diagnosis not present

## 2023-01-13 DIAGNOSIS — M109 Gout, unspecified: Secondary | ICD-10-CM | POA: Diagnosis not present

## 2023-01-13 DIAGNOSIS — I70202 Unspecified atherosclerosis of native arteries of extremities, left leg: Secondary | ICD-10-CM | POA: Diagnosis not present

## 2023-01-13 DIAGNOSIS — Z7982 Long term (current) use of aspirin: Secondary | ICD-10-CM | POA: Diagnosis not present

## 2023-01-13 DIAGNOSIS — I87302 Chronic venous hypertension (idiopathic) without complications of left lower extremity: Secondary | ICD-10-CM | POA: Diagnosis not present

## 2023-01-13 DIAGNOSIS — Z48812 Encounter for surgical aftercare following surgery on the circulatory system: Secondary | ICD-10-CM | POA: Diagnosis not present

## 2023-01-13 DIAGNOSIS — E782 Mixed hyperlipidemia: Secondary | ICD-10-CM | POA: Diagnosis not present

## 2023-01-13 DIAGNOSIS — M1990 Unspecified osteoarthritis, unspecified site: Secondary | ICD-10-CM | POA: Diagnosis not present

## 2023-01-14 DIAGNOSIS — I110 Hypertensive heart disease with heart failure: Secondary | ICD-10-CM | POA: Diagnosis not present

## 2023-01-14 DIAGNOSIS — R918 Other nonspecific abnormal finding of lung field: Secondary | ICD-10-CM | POA: Diagnosis not present

## 2023-01-14 DIAGNOSIS — I87302 Chronic venous hypertension (idiopathic) without complications of left lower extremity: Secondary | ICD-10-CM | POA: Diagnosis not present

## 2023-01-14 DIAGNOSIS — Z96611 Presence of right artificial shoulder joint: Secondary | ICD-10-CM | POA: Diagnosis not present

## 2023-01-14 DIAGNOSIS — Z8673 Personal history of transient ischemic attack (TIA), and cerebral infarction without residual deficits: Secondary | ICD-10-CM | POA: Diagnosis not present

## 2023-01-14 DIAGNOSIS — I509 Heart failure, unspecified: Secondary | ICD-10-CM | POA: Diagnosis not present

## 2023-01-14 DIAGNOSIS — Z96612 Presence of left artificial shoulder joint: Secondary | ICD-10-CM | POA: Diagnosis not present

## 2023-01-14 DIAGNOSIS — E1151 Type 2 diabetes mellitus with diabetic peripheral angiopathy without gangrene: Secondary | ICD-10-CM | POA: Diagnosis not present

## 2023-01-14 DIAGNOSIS — Z7982 Long term (current) use of aspirin: Secondary | ICD-10-CM | POA: Diagnosis not present

## 2023-01-14 DIAGNOSIS — Z48812 Encounter for surgical aftercare following surgery on the circulatory system: Secondary | ICD-10-CM | POA: Diagnosis not present

## 2023-01-14 DIAGNOSIS — Z7902 Long term (current) use of antithrombotics/antiplatelets: Secondary | ICD-10-CM | POA: Diagnosis not present

## 2023-01-14 DIAGNOSIS — I4891 Unspecified atrial fibrillation: Secondary | ICD-10-CM | POA: Diagnosis not present

## 2023-01-14 DIAGNOSIS — L853 Xerosis cutis: Secondary | ICD-10-CM | POA: Diagnosis not present

## 2023-01-14 DIAGNOSIS — E782 Mixed hyperlipidemia: Secondary | ICD-10-CM | POA: Diagnosis not present

## 2023-01-14 DIAGNOSIS — M109 Gout, unspecified: Secondary | ICD-10-CM | POA: Diagnosis not present

## 2023-01-14 DIAGNOSIS — I70202 Unspecified atherosclerosis of native arteries of extremities, left leg: Secondary | ICD-10-CM | POA: Diagnosis not present

## 2023-01-14 DIAGNOSIS — Z993 Dependence on wheelchair: Secondary | ICD-10-CM | POA: Diagnosis not present

## 2023-01-14 DIAGNOSIS — Z96642 Presence of left artificial hip joint: Secondary | ICD-10-CM | POA: Diagnosis not present

## 2023-01-14 DIAGNOSIS — Z7984 Long term (current) use of oral hypoglycemic drugs: Secondary | ICD-10-CM | POA: Diagnosis not present

## 2023-01-14 DIAGNOSIS — M1990 Unspecified osteoarthritis, unspecified site: Secondary | ICD-10-CM | POA: Diagnosis not present

## 2023-01-14 DIAGNOSIS — Z87891 Personal history of nicotine dependence: Secondary | ICD-10-CM | POA: Diagnosis not present

## 2023-01-14 DIAGNOSIS — G928 Other toxic encephalopathy: Secondary | ICD-10-CM | POA: Diagnosis not present

## 2023-01-19 DIAGNOSIS — Z96611 Presence of right artificial shoulder joint: Secondary | ICD-10-CM | POA: Diagnosis not present

## 2023-01-19 DIAGNOSIS — L853 Xerosis cutis: Secondary | ICD-10-CM | POA: Diagnosis not present

## 2023-01-19 DIAGNOSIS — I509 Heart failure, unspecified: Secondary | ICD-10-CM | POA: Diagnosis not present

## 2023-01-19 DIAGNOSIS — I4891 Unspecified atrial fibrillation: Secondary | ICD-10-CM | POA: Diagnosis not present

## 2023-01-19 DIAGNOSIS — I110 Hypertensive heart disease with heart failure: Secondary | ICD-10-CM | POA: Diagnosis not present

## 2023-01-19 DIAGNOSIS — Z48812 Encounter for surgical aftercare following surgery on the circulatory system: Secondary | ICD-10-CM | POA: Diagnosis not present

## 2023-01-19 DIAGNOSIS — I70202 Unspecified atherosclerosis of native arteries of extremities, left leg: Secondary | ICD-10-CM | POA: Diagnosis not present

## 2023-01-19 DIAGNOSIS — Z96612 Presence of left artificial shoulder joint: Secondary | ICD-10-CM | POA: Diagnosis not present

## 2023-01-19 DIAGNOSIS — Z8673 Personal history of transient ischemic attack (TIA), and cerebral infarction without residual deficits: Secondary | ICD-10-CM | POA: Diagnosis not present

## 2023-01-19 DIAGNOSIS — R918 Other nonspecific abnormal finding of lung field: Secondary | ICD-10-CM | POA: Diagnosis not present

## 2023-01-19 DIAGNOSIS — Z7982 Long term (current) use of aspirin: Secondary | ICD-10-CM | POA: Diagnosis not present

## 2023-01-19 DIAGNOSIS — M1990 Unspecified osteoarthritis, unspecified site: Secondary | ICD-10-CM | POA: Diagnosis not present

## 2023-01-19 DIAGNOSIS — I87302 Chronic venous hypertension (idiopathic) without complications of left lower extremity: Secondary | ICD-10-CM | POA: Diagnosis not present

## 2023-01-19 DIAGNOSIS — M109 Gout, unspecified: Secondary | ICD-10-CM | POA: Diagnosis not present

## 2023-01-19 DIAGNOSIS — Z7984 Long term (current) use of oral hypoglycemic drugs: Secondary | ICD-10-CM | POA: Diagnosis not present

## 2023-01-19 DIAGNOSIS — E782 Mixed hyperlipidemia: Secondary | ICD-10-CM | POA: Diagnosis not present

## 2023-01-19 DIAGNOSIS — Z87891 Personal history of nicotine dependence: Secondary | ICD-10-CM | POA: Diagnosis not present

## 2023-01-19 DIAGNOSIS — Z993 Dependence on wheelchair: Secondary | ICD-10-CM | POA: Diagnosis not present

## 2023-01-19 DIAGNOSIS — Z96642 Presence of left artificial hip joint: Secondary | ICD-10-CM | POA: Diagnosis not present

## 2023-01-19 DIAGNOSIS — Z7902 Long term (current) use of antithrombotics/antiplatelets: Secondary | ICD-10-CM | POA: Diagnosis not present

## 2023-01-19 DIAGNOSIS — E1151 Type 2 diabetes mellitus with diabetic peripheral angiopathy without gangrene: Secondary | ICD-10-CM | POA: Diagnosis not present

## 2023-01-19 DIAGNOSIS — G928 Other toxic encephalopathy: Secondary | ICD-10-CM | POA: Diagnosis not present

## 2023-01-22 DIAGNOSIS — Z8673 Personal history of transient ischemic attack (TIA), and cerebral infarction without residual deficits: Secondary | ICD-10-CM | POA: Diagnosis not present

## 2023-01-22 DIAGNOSIS — E1151 Type 2 diabetes mellitus with diabetic peripheral angiopathy without gangrene: Secondary | ICD-10-CM | POA: Diagnosis not present

## 2023-01-22 DIAGNOSIS — Z87891 Personal history of nicotine dependence: Secondary | ICD-10-CM | POA: Diagnosis not present

## 2023-01-22 DIAGNOSIS — I509 Heart failure, unspecified: Secondary | ICD-10-CM | POA: Diagnosis not present

## 2023-01-22 DIAGNOSIS — Z7984 Long term (current) use of oral hypoglycemic drugs: Secondary | ICD-10-CM | POA: Diagnosis not present

## 2023-01-22 DIAGNOSIS — R918 Other nonspecific abnormal finding of lung field: Secondary | ICD-10-CM | POA: Diagnosis not present

## 2023-01-22 DIAGNOSIS — Z48812 Encounter for surgical aftercare following surgery on the circulatory system: Secondary | ICD-10-CM | POA: Diagnosis not present

## 2023-01-22 DIAGNOSIS — I87302 Chronic venous hypertension (idiopathic) without complications of left lower extremity: Secondary | ICD-10-CM | POA: Diagnosis not present

## 2023-01-22 DIAGNOSIS — L853 Xerosis cutis: Secondary | ICD-10-CM | POA: Diagnosis not present

## 2023-01-22 DIAGNOSIS — I110 Hypertensive heart disease with heart failure: Secondary | ICD-10-CM | POA: Diagnosis not present

## 2023-01-22 DIAGNOSIS — G928 Other toxic encephalopathy: Secondary | ICD-10-CM | POA: Diagnosis not present

## 2023-01-22 DIAGNOSIS — I4891 Unspecified atrial fibrillation: Secondary | ICD-10-CM | POA: Diagnosis not present

## 2023-01-22 DIAGNOSIS — Z96642 Presence of left artificial hip joint: Secondary | ICD-10-CM | POA: Diagnosis not present

## 2023-01-22 DIAGNOSIS — M1990 Unspecified osteoarthritis, unspecified site: Secondary | ICD-10-CM | POA: Diagnosis not present

## 2023-01-22 DIAGNOSIS — Z993 Dependence on wheelchair: Secondary | ICD-10-CM | POA: Diagnosis not present

## 2023-01-22 DIAGNOSIS — Z7902 Long term (current) use of antithrombotics/antiplatelets: Secondary | ICD-10-CM | POA: Diagnosis not present

## 2023-01-22 DIAGNOSIS — Z96611 Presence of right artificial shoulder joint: Secondary | ICD-10-CM | POA: Diagnosis not present

## 2023-01-22 DIAGNOSIS — E782 Mixed hyperlipidemia: Secondary | ICD-10-CM | POA: Diagnosis not present

## 2023-01-22 DIAGNOSIS — Z7982 Long term (current) use of aspirin: Secondary | ICD-10-CM | POA: Diagnosis not present

## 2023-01-22 DIAGNOSIS — Z96612 Presence of left artificial shoulder joint: Secondary | ICD-10-CM | POA: Diagnosis not present

## 2023-01-22 DIAGNOSIS — I70202 Unspecified atherosclerosis of native arteries of extremities, left leg: Secondary | ICD-10-CM | POA: Diagnosis not present

## 2023-01-22 DIAGNOSIS — M109 Gout, unspecified: Secondary | ICD-10-CM | POA: Diagnosis not present

## 2023-01-27 DIAGNOSIS — I110 Hypertensive heart disease with heart failure: Secondary | ICD-10-CM | POA: Diagnosis not present

## 2023-01-27 DIAGNOSIS — Z7982 Long term (current) use of aspirin: Secondary | ICD-10-CM | POA: Diagnosis not present

## 2023-01-27 DIAGNOSIS — Z96611 Presence of right artificial shoulder joint: Secondary | ICD-10-CM | POA: Diagnosis not present

## 2023-01-27 DIAGNOSIS — M1990 Unspecified osteoarthritis, unspecified site: Secondary | ICD-10-CM | POA: Diagnosis not present

## 2023-01-27 DIAGNOSIS — Z96642 Presence of left artificial hip joint: Secondary | ICD-10-CM | POA: Diagnosis not present

## 2023-01-27 DIAGNOSIS — I87302 Chronic venous hypertension (idiopathic) without complications of left lower extremity: Secondary | ICD-10-CM | POA: Diagnosis not present

## 2023-01-27 DIAGNOSIS — G928 Other toxic encephalopathy: Secondary | ICD-10-CM | POA: Diagnosis not present

## 2023-01-27 DIAGNOSIS — E782 Mixed hyperlipidemia: Secondary | ICD-10-CM | POA: Diagnosis not present

## 2023-01-27 DIAGNOSIS — Z87891 Personal history of nicotine dependence: Secondary | ICD-10-CM | POA: Diagnosis not present

## 2023-01-27 DIAGNOSIS — I4891 Unspecified atrial fibrillation: Secondary | ICD-10-CM | POA: Diagnosis not present

## 2023-01-27 DIAGNOSIS — Z7984 Long term (current) use of oral hypoglycemic drugs: Secondary | ICD-10-CM | POA: Diagnosis not present

## 2023-01-27 DIAGNOSIS — Z96612 Presence of left artificial shoulder joint: Secondary | ICD-10-CM | POA: Diagnosis not present

## 2023-01-27 DIAGNOSIS — R918 Other nonspecific abnormal finding of lung field: Secondary | ICD-10-CM | POA: Diagnosis not present

## 2023-01-27 DIAGNOSIS — Z993 Dependence on wheelchair: Secondary | ICD-10-CM | POA: Diagnosis not present

## 2023-01-27 DIAGNOSIS — I70202 Unspecified atherosclerosis of native arteries of extremities, left leg: Secondary | ICD-10-CM | POA: Diagnosis not present

## 2023-01-27 DIAGNOSIS — I509 Heart failure, unspecified: Secondary | ICD-10-CM | POA: Diagnosis not present

## 2023-01-27 DIAGNOSIS — E1151 Type 2 diabetes mellitus with diabetic peripheral angiopathy without gangrene: Secondary | ICD-10-CM | POA: Diagnosis not present

## 2023-01-27 DIAGNOSIS — L853 Xerosis cutis: Secondary | ICD-10-CM | POA: Diagnosis not present

## 2023-01-27 DIAGNOSIS — M109 Gout, unspecified: Secondary | ICD-10-CM | POA: Diagnosis not present

## 2023-01-27 DIAGNOSIS — Z8673 Personal history of transient ischemic attack (TIA), and cerebral infarction without residual deficits: Secondary | ICD-10-CM | POA: Diagnosis not present

## 2023-01-27 DIAGNOSIS — Z48812 Encounter for surgical aftercare following surgery on the circulatory system: Secondary | ICD-10-CM | POA: Diagnosis not present

## 2023-01-27 DIAGNOSIS — Z7902 Long term (current) use of antithrombotics/antiplatelets: Secondary | ICD-10-CM | POA: Diagnosis not present

## 2023-01-28 DIAGNOSIS — R404 Transient alteration of awareness: Secondary | ICD-10-CM | POA: Diagnosis not present

## 2023-01-28 DIAGNOSIS — I469 Cardiac arrest, cause unspecified: Secondary | ICD-10-CM | POA: Diagnosis not present

## 2023-01-29 ENCOUNTER — Other Ambulatory Visit: Payer: Self-pay | Admitting: Interventional Cardiology

## 2023-02-06 DEATH — deceased

## 2023-05-03 NOTE — Progress Notes (Deleted)
 Office Note   History of Present Illness   [name] is a 88 y.o. (1935-07-26) male who presents for wound check.  He is status post angiogram demonstrating Chronically occluded superficial femoral artery with reconstitution at the P2 segment of the popliteal artery in March this was completed due to tissue loss in the left foot.. We discussed redo left common femoral artery exposure, femoral to below-knee popliteal artery bypass on multiple occasions however [name] is not interested in more surgery.  Surgical history includes: Bilateral iliofemoral endarterectomy with bilateral common iliac artery stents on 10/09/2021 by Dr. Rosalva Comber.  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  He presents today for wound check with known, mixed arteriovenous disease.  Since last seen, [name] had a hospitalization which required skilled nursing facility.  Fortunately, the diligent wound care at the nursing facility has healed his left leg wound without intervention. He is currently ambulating with a walker and walked 40 steps yesterday.  Having some issues with left knee contraction.  Denies claudication, ischemic rest pain, tissue loss improved   Current Outpatient Medications  Medication Sig Dispense Refill   aspirin  EC 81 MG tablet Take 81 mg by mouth every other day. Swallow whole.     atenolol  (TENORMIN ) 50 MG tablet Take 50 mg by mouth daily.     clopidogrel  (PLAVIX ) 75 MG tablet TAKE 1 TABLET BY MOUTH ONCE DAILY AT 6 AM (Patient taking differently: Take 75 mg by mouth daily.) 90 tablet 1   fluticasone  (FLONASE ) 50 MCG/ACT nasal spray Place 2 sprays into both nostrils as needed for allergies.     furosemide  (LASIX ) 20 MG tablet Take 20 mg by mouth daily.     hydrALAZINE  (APRESOLINE ) 25 MG tablet Take 1 tablet (25 mg total) by mouth every 8 (eight) hours.     metFORMIN  (GLUCOPHAGE ) 1000 MG tablet Take 1,000 mg by mouth in the morning and at bedtime.     Misc  Natural Products (BEET ROOT PO) Take 1 tablet by mouth daily.     Multiple Vitamin (MULTIVITAMIN WITH MINERALS) TABS tablet Take 1 tablet by mouth daily.     protein supplement shake (PREMIER PROTEIN) LIQD Take 11 oz by mouth daily.     rosuvastatin  (CRESTOR ) 10 MG tablet Take 10 mg by mouth daily.     tamsulosin  (FLOMAX ) 0.4 MG CAPS capsule Take 1 capsule (0.4 mg total) by mouth daily after breakfast.     Tetrahydrozoline HCl (VISINE OP) Place 1 drop into both eyes daily as needed (burning).     ULORIC  80 MG TABS Take 80 mg by mouth daily.  3   No current facility-administered medications for this visit.    REVIEW OF SYSTEMS (negative unless checked):   Cardiac:  []  Chest pain or chest pressure? []  Shortness of breath upon activity? []  Shortness of breath when lying flat? []  Irregular heart rhythm?  Vascular:  []  Pain in calf, thigh, or hip brought on by walking? []  Pain in feet at night that wakes you up from your sleep? []  Blood clot in your veins? []  Leg swelling?  Pulmonary:  []  Oxygen at home? []  Productive cough? []  Wheezing?  Neurologic:  []  Sudden weakness in arms or legs? []  Sudden numbness in arms or legs? []  Sudden onset of difficult speaking or slurred speech? []  Temporary loss of vision in one eye? []  Problems with dizziness?  Gastrointestinal:  []  Blood in stool? []  Vomited blood?  Genitourinary:  []   Burning when urinating? []  Blood in urine?  Psychiatric:  []  Major depression  Hematologic:  []  Bleeding problems? []  Problems with blood clotting?  Dermatologic:  []  Rashes or ulcers?  Constitutional:  []  Fever or chills?  Ear/Nose/Throat:  []  Change in hearing? []  Nose bleeds? []  Sore throat?  Musculoskeletal:  []  Back pain? []  Joint pain? []  Muscle pain?   Physical Examination   There were no vitals filed for this visit.  There is no height or weight on file to calculate BMI.  General:  WDWN in NAD; vital signs documented  above Gait: Not observed HENT: WNL, normocephalic Pulmonary: normal non-labored breathing  Cardiac: regular rate and rhythm Abdomen: soft, NT, no masses Skin: without rashes Vascular Exam/Pulses: monophasic DP/PT doppler signals on the left. Biphasic DP/PT doppler signals on the right Extremities: Right great toe ulceration with improved appearance and more superficial.  Right ankle wound improved in appearance with scab.  Left anterior distal shin wound with no change in appearance. Edematous lower legs bilaterally Musculoskeletal: no muscle wasting or atrophy  Neurologic: A&O X 3;  No focal weakness or paresthesias are detected Psychiatric:  The pt has Normal affect.     Non-Invasive Vascular imaging   Abdominal Aorta Findings:  +-------------+-------+----------+----------+--------+--------+--------+  Location    AP (cm)Trans (cm)PSV (cm/s)WaveformThrombusComments  +-------------+-------+----------+----------+--------+--------+--------+  Proximal                     57                                  +-------------+-------+----------+----------+--------+--------+--------+  Mid                          39                                  +-------------+-------+----------+----------+--------+--------+--------+  Distal                       44                                  +-------------+-------+----------+----------+--------+--------+--------+  RT EIA Prox                   143                                 +-------------+-------+----------+----------+--------+--------+--------+  RT EIA Mid                    185                                 +-------------+-------+----------+----------+--------+--------+--------+  RT EIA Distal                 96                                  +-------------+-------+----------+----------+--------+--------+--------+  LT EIA Prox                   96                                   +-------------+-------+----------+----------+--------+--------+--------+  LT EIA Mid                    76                                  +-------------+-------+----------+----------+--------+--------+--------+  LT EIA Distal                 66                                  +-------------+-------+----------+----------+--------+--------+--------+     Right Stent(s):  +---------------+--------+---------------+--------+--------+  CIA           PSV cm/sStenosis       WaveformComments  +---------------+--------+---------------+--------+--------+  Prox to Stent  46                                       +---------------+--------+---------------+--------+--------+  Proximal Stent 161                    biphasic          +---------------+--------+---------------+--------+--------+  Mid Stent      121                    biphasic          +---------------+--------+---------------+--------+--------+  Distal Stent   220     50-99% stenosisbiphasic          +---------------+--------+---------------+--------+--------+  Distal to Stent138                                      +---------------+--------+---------------+--------+--------+      Left Stent(s):  +---------------+---++++  Prox to Stent  46   +---------------+---++++  Proximal Stent 114  +---------------+---++++  Mid Stent      71   +---------------+---++++  Distal Stent   72   +---------------+---++++  Distal to Stent95   +---------------+---++++     Summary:  Stenosis: +------------------+---------------+  Location          Stent            +------------------+---------------+  Right Common Iliac50-99% stenosis  +------------------+---------------+  Left Common Iliac 1-49% stenosis   +------------------+---------------+    Medical Decision Making   [name] is a 88 y.o. male who presents for wound check.  The wound  has improved dramatically.  See picture above.    On physical exam, [name] had palpable femoral pulses bilaterally. ABI was reviewed demonstrating decreased from last visit, however when evaluating previous ABIs, it is still within range.  Last angio in March demonstrated no flow-limiting stenosis.  Aortoiliac duplex today demonstrated a small amount of stenosis bilaterally, velocities were not high enough to warrant intervention, especially in the setting of palpable femoral pulses which were appreciated in the sitting position.  I congratulated [name] and his daughter on healing his left leg wounds.  Very small residual wound which appears to be healed under the eschar.  We discussed the continued importance of elevation and compression.  My plan is to follow his stents in 60-month intervals.  I asked him to call should any questions or concerns arise.  At his next visit I also will also evaluate his  carotid arteries as it has been a year and he has known moderate stenosis bilaterally.  Continue current medications.  Kayla Part MD  Total time of patient care including pre-visit research, consultation, and documentation greater than 30 minutes

## 2023-05-06 ENCOUNTER — Encounter (HOSPITAL_COMMUNITY): Payer: Medicare Other

## 2023-05-06 ENCOUNTER — Ambulatory Visit: Payer: Medicare Other | Admitting: Vascular Surgery

## 2023-11-18 ENCOUNTER — Other Ambulatory Visit: Payer: Self-pay | Admitting: Vascular Surgery

## 2023-11-18 DIAGNOSIS — I7025 Atherosclerosis of native arteries of other extremities with ulceration: Secondary | ICD-10-CM

## 2023-11-18 DIAGNOSIS — I739 Peripheral vascular disease, unspecified: Secondary | ICD-10-CM

## 2023-11-18 DIAGNOSIS — I6523 Occlusion and stenosis of bilateral carotid arteries: Secondary | ICD-10-CM
